# Patient Record
Sex: Male | Born: 1945 | Race: White | Hispanic: No | Marital: Married | Smoking: Never smoker
Health system: Southern US, Community
[De-identification: ages and names within clinical notes are randomized; demographics above are authoritative.]

## PROBLEM LIST (undated history)

## (undated) DIAGNOSIS — M5430 Sciatica, unspecified side: Secondary | ICD-10-CM

## (undated) DIAGNOSIS — N183 Chronic kidney disease, stage 3 unspecified: Secondary | ICD-10-CM

## (undated) DIAGNOSIS — E785 Hyperlipidemia, unspecified: Secondary | ICD-10-CM

## (undated) DIAGNOSIS — U071 COVID-19: Secondary | ICD-10-CM

## (undated) DIAGNOSIS — Z8719 Personal history of other diseases of the digestive system: Secondary | ICD-10-CM

## (undated) DIAGNOSIS — I639 Cerebral infarction, unspecified: Secondary | ICD-10-CM

## (undated) DIAGNOSIS — K219 Gastro-esophageal reflux disease without esophagitis: Secondary | ICD-10-CM

## (undated) DIAGNOSIS — I10 Essential (primary) hypertension: Secondary | ICD-10-CM

## (undated) DIAGNOSIS — Z87442 Personal history of urinary calculi: Secondary | ICD-10-CM

## (undated) DIAGNOSIS — K4090 Unilateral inguinal hernia, without obstruction or gangrene, not specified as recurrent: Secondary | ICD-10-CM

## (undated) DIAGNOSIS — G459 Transient cerebral ischemic attack, unspecified: Secondary | ICD-10-CM

## (undated) DIAGNOSIS — Z9289 Personal history of other medical treatment: Secondary | ICD-10-CM

## (undated) DIAGNOSIS — C439 Malignant melanoma of skin, unspecified: Secondary | ICD-10-CM

## (undated) DIAGNOSIS — M1990 Unspecified osteoarthritis, unspecified site: Secondary | ICD-10-CM

## (undated) HISTORY — DX: Hyperlipidemia, unspecified: E78.5

## (undated) HISTORY — DX: Malignant melanoma of skin, unspecified: C43.9

## (undated) HISTORY — DX: Sciatica, unspecified side: M54.30

## (undated) HISTORY — DX: Personal history of other medical treatment: Z92.89

## (undated) HISTORY — PX: NERVE REPAIR: SHX2083

## (undated) HISTORY — DX: Transient cerebral ischemic attack, unspecified: G45.9

## (undated) HISTORY — PX: BACK SURGERY: SHX140

## (undated) HISTORY — PX: LUMBAR DISC SURGERY: SHX700

## (undated) HISTORY — DX: Essential (primary) hypertension: I10

## (undated) HISTORY — DX: COVID-19: U07.1

## (undated) HISTORY — DX: Unilateral inguinal hernia, without obstruction or gangrene, not specified as recurrent: K40.90

---

## 1986-09-20 HISTORY — PX: OTHER SURGICAL HISTORY: SHX169

## 1995-12-20 HISTORY — PX: LAPAROSCOPIC INGUINAL HERNIA REPAIR: SUR788

## 1998-05-20 ENCOUNTER — Encounter: Admission: RE | Admit: 1998-05-20 | Discharge: 1998-05-20 | Payer: Self-pay | Admitting: Family Medicine

## 1998-07-01 ENCOUNTER — Encounter: Admission: RE | Admit: 1998-07-01 | Discharge: 1998-07-01 | Payer: Self-pay | Admitting: Family Medicine

## 1998-11-19 HISTORY — PX: SEPTOPLASTY: SUR1290

## 1998-11-24 ENCOUNTER — Ambulatory Visit (HOSPITAL_BASED_OUTPATIENT_CLINIC_OR_DEPARTMENT_OTHER): Admission: RE | Admit: 1998-11-24 | Discharge: 1998-11-24 | Payer: Self-pay | Admitting: Otolaryngology

## 1999-01-28 ENCOUNTER — Encounter: Admission: RE | Admit: 1999-01-28 | Discharge: 1999-01-28 | Payer: Self-pay | Admitting: Family Medicine

## 1999-10-30 ENCOUNTER — Encounter: Admission: RE | Admit: 1999-10-30 | Discharge: 1999-10-30 | Payer: Self-pay | Admitting: Family Medicine

## 2000-01-26 ENCOUNTER — Encounter: Admission: RE | Admit: 2000-01-26 | Discharge: 2000-01-26 | Payer: Self-pay | Admitting: Family Medicine

## 2000-01-27 ENCOUNTER — Encounter: Admission: RE | Admit: 2000-01-27 | Discharge: 2000-01-27 | Payer: Self-pay | Admitting: Family Medicine

## 2000-07-05 ENCOUNTER — Encounter: Admission: RE | Admit: 2000-07-05 | Discharge: 2000-07-05 | Payer: Self-pay | Admitting: Family Medicine

## 2000-07-20 ENCOUNTER — Encounter: Payer: Self-pay | Admitting: Family Medicine

## 2000-07-20 ENCOUNTER — Encounter: Admission: RE | Admit: 2000-07-20 | Discharge: 2000-07-20 | Payer: Self-pay | Admitting: Family Medicine

## 2000-07-21 HISTORY — PX: OTHER SURGICAL HISTORY: SHX169

## 2000-07-27 ENCOUNTER — Encounter: Admission: RE | Admit: 2000-07-27 | Discharge: 2000-07-27 | Payer: Self-pay | Admitting: Family Medicine

## 2001-01-10 ENCOUNTER — Encounter: Admission: RE | Admit: 2001-01-10 | Discharge: 2001-01-10 | Payer: Self-pay | Admitting: Family Medicine

## 2001-05-02 ENCOUNTER — Encounter: Admission: RE | Admit: 2001-05-02 | Discharge: 2001-05-02 | Payer: Self-pay | Admitting: Family Medicine

## 2001-05-04 ENCOUNTER — Encounter: Admission: RE | Admit: 2001-05-04 | Discharge: 2001-05-04 | Payer: Self-pay | Admitting: Family Medicine

## 2001-07-31 ENCOUNTER — Encounter: Admission: RE | Admit: 2001-07-31 | Discharge: 2001-07-31 | Payer: Self-pay | Admitting: Sports Medicine

## 2001-10-04 ENCOUNTER — Encounter: Admission: RE | Admit: 2001-10-04 | Discharge: 2001-10-04 | Payer: Self-pay | Admitting: Sports Medicine

## 2001-10-05 ENCOUNTER — Encounter: Admission: RE | Admit: 2001-10-05 | Discharge: 2001-10-05 | Payer: Self-pay | Admitting: Family Medicine

## 2001-11-01 ENCOUNTER — Encounter: Admission: RE | Admit: 2001-11-01 | Discharge: 2001-11-01 | Payer: Self-pay | Admitting: Family Medicine

## 2002-02-13 ENCOUNTER — Encounter: Admission: RE | Admit: 2002-02-13 | Discharge: 2002-02-13 | Payer: Self-pay | Admitting: Family Medicine

## 2002-10-03 ENCOUNTER — Encounter: Admission: RE | Admit: 2002-10-03 | Discharge: 2002-10-03 | Payer: Self-pay | Admitting: Family Medicine

## 2002-10-08 ENCOUNTER — Encounter: Admission: RE | Admit: 2002-10-08 | Discharge: 2002-10-08 | Payer: Self-pay | Admitting: Family Medicine

## 2002-10-31 ENCOUNTER — Encounter: Admission: RE | Admit: 2002-10-31 | Discharge: 2002-10-31 | Payer: Self-pay | Admitting: Family Medicine

## 2002-11-28 ENCOUNTER — Encounter: Admission: RE | Admit: 2002-11-28 | Discharge: 2002-11-28 | Payer: Self-pay | Admitting: Family Medicine

## 2003-01-16 ENCOUNTER — Encounter: Admission: RE | Admit: 2003-01-16 | Discharge: 2003-01-16 | Payer: Self-pay | Admitting: Family Medicine

## 2003-03-20 ENCOUNTER — Encounter: Admission: RE | Admit: 2003-03-20 | Discharge: 2003-03-20 | Payer: Self-pay | Admitting: Family Medicine

## 2003-10-29 ENCOUNTER — Ambulatory Visit (HOSPITAL_COMMUNITY): Admission: RE | Admit: 2003-10-29 | Discharge: 2003-10-29 | Payer: Self-pay | Admitting: Family Medicine

## 2003-10-29 ENCOUNTER — Encounter: Admission: RE | Admit: 2003-10-29 | Discharge: 2003-10-29 | Payer: Self-pay | Admitting: Family Medicine

## 2003-11-12 ENCOUNTER — Encounter: Admission: RE | Admit: 2003-11-12 | Discharge: 2003-11-12 | Payer: Self-pay | Admitting: Family Medicine

## 2003-11-19 DIAGNOSIS — Z9289 Personal history of other medical treatment: Secondary | ICD-10-CM

## 2003-11-19 HISTORY — DX: Personal history of other medical treatment: Z92.89

## 2003-11-21 ENCOUNTER — Ambulatory Visit (HOSPITAL_COMMUNITY): Admission: RE | Admit: 2003-11-21 | Discharge: 2003-11-21 | Payer: Self-pay | Admitting: Family Medicine

## 2003-11-21 HISTORY — PX: OTHER SURGICAL HISTORY: SHX169

## 2003-11-26 ENCOUNTER — Encounter: Admission: RE | Admit: 2003-11-26 | Discharge: 2003-11-26 | Payer: Self-pay | Admitting: Family Medicine

## 2004-01-13 ENCOUNTER — Encounter: Admission: RE | Admit: 2004-01-13 | Discharge: 2004-01-13 | Payer: Self-pay | Admitting: Family Medicine

## 2004-01-21 ENCOUNTER — Encounter: Admission: RE | Admit: 2004-01-21 | Discharge: 2004-01-21 | Payer: Self-pay | Admitting: Family Medicine

## 2004-12-08 ENCOUNTER — Ambulatory Visit: Payer: Self-pay | Admitting: Family Medicine

## 2004-12-08 HISTORY — PX: OTHER SURGICAL HISTORY: SHX169

## 2004-12-22 ENCOUNTER — Ambulatory Visit: Payer: Self-pay | Admitting: Family Medicine

## 2004-12-22 HISTORY — PX: OTHER SURGICAL HISTORY: SHX169

## 2004-12-30 ENCOUNTER — Ambulatory Visit: Payer: Self-pay | Admitting: Family Medicine

## 2005-01-20 ENCOUNTER — Ambulatory Visit: Payer: Self-pay | Admitting: Family Medicine

## 2005-01-20 HISTORY — PX: OTHER SURGICAL HISTORY: SHX169

## 2005-01-26 ENCOUNTER — Ambulatory Visit: Payer: Self-pay | Admitting: Family Medicine

## 2005-03-03 ENCOUNTER — Ambulatory Visit: Payer: Self-pay | Admitting: Family Medicine

## 2005-09-07 ENCOUNTER — Ambulatory Visit: Payer: Self-pay | Admitting: Sports Medicine

## 2005-12-15 ENCOUNTER — Ambulatory Visit: Payer: Self-pay | Admitting: Family Medicine

## 2005-12-19 HISTORY — PX: OTHER SURGICAL HISTORY: SHX169

## 2006-02-22 ENCOUNTER — Ambulatory Visit: Payer: Self-pay | Admitting: Family Medicine

## 2006-11-17 DIAGNOSIS — G4730 Sleep apnea, unspecified: Secondary | ICD-10-CM | POA: Insufficient documentation

## 2006-11-17 DIAGNOSIS — J45909 Unspecified asthma, uncomplicated: Secondary | ICD-10-CM | POA: Insufficient documentation

## 2006-11-17 DIAGNOSIS — I10 Essential (primary) hypertension: Secondary | ICD-10-CM | POA: Insufficient documentation

## 2006-11-17 DIAGNOSIS — H9190 Unspecified hearing loss, unspecified ear: Secondary | ICD-10-CM | POA: Insufficient documentation

## 2006-11-17 DIAGNOSIS — N200 Calculus of kidney: Secondary | ICD-10-CM | POA: Insufficient documentation

## 2006-11-17 DIAGNOSIS — E785 Hyperlipidemia, unspecified: Secondary | ICD-10-CM | POA: Insufficient documentation

## 2006-11-17 DIAGNOSIS — J309 Allergic rhinitis, unspecified: Secondary | ICD-10-CM | POA: Insufficient documentation

## 2006-11-17 DIAGNOSIS — M109 Gout, unspecified: Secondary | ICD-10-CM | POA: Insufficient documentation

## 2006-11-17 DIAGNOSIS — E781 Pure hyperglyceridemia: Secondary | ICD-10-CM | POA: Insufficient documentation

## 2006-11-17 DIAGNOSIS — L578 Other skin changes due to chronic exposure to nonionizing radiation: Secondary | ICD-10-CM | POA: Insufficient documentation

## 2009-09-20 DIAGNOSIS — I639 Cerebral infarction, unspecified: Secondary | ICD-10-CM

## 2009-09-20 HISTORY — DX: Cerebral infarction, unspecified: I63.9

## 2009-11-04 ENCOUNTER — Encounter: Admission: RE | Admit: 2009-11-04 | Discharge: 2009-11-04 | Payer: Self-pay | Admitting: Family Medicine

## 2009-12-19 ENCOUNTER — Encounter: Admission: RE | Admit: 2009-12-19 | Discharge: 2009-12-19 | Payer: Self-pay | Admitting: Family Medicine

## 2009-12-19 DIAGNOSIS — G459 Transient cerebral ischemic attack, unspecified: Secondary | ICD-10-CM

## 2009-12-19 HISTORY — DX: Transient cerebral ischemic attack, unspecified: G45.9

## 2009-12-31 ENCOUNTER — Observation Stay (HOSPITAL_COMMUNITY): Admission: EM | Admit: 2009-12-31 | Discharge: 2010-01-01 | Payer: Self-pay | Admitting: Emergency Medicine

## 2010-01-01 ENCOUNTER — Encounter (INDEPENDENT_AMBULATORY_CARE_PROVIDER_SITE_OTHER): Payer: Self-pay | Admitting: Emergency Medicine

## 2010-01-01 ENCOUNTER — Ambulatory Visit: Payer: Self-pay | Admitting: Surgery

## 2010-04-07 ENCOUNTER — Encounter: Admission: RE | Admit: 2010-04-07 | Discharge: 2010-04-07 | Payer: Self-pay | Admitting: Neurological Surgery

## 2010-04-20 ENCOUNTER — Encounter: Payer: Self-pay | Admitting: Cardiology

## 2010-04-23 ENCOUNTER — Encounter: Payer: Self-pay | Admitting: Cardiology

## 2010-05-20 ENCOUNTER — Ambulatory Visit: Payer: Self-pay | Admitting: Cardiology

## 2010-10-11 ENCOUNTER — Encounter: Payer: Self-pay | Admitting: Family Medicine

## 2010-10-20 NOTE — Letter (Signed)
Summary: [facility]   Imported By: Harlon Flor 06/02/2010 08:13:58  _____________________________________________________________________  External Attachment:    Type:   Image     Comment:   External Document

## 2010-10-20 NOTE — Assessment & Plan Note (Signed)
Summary: surgical clearence/mt  Medications Added METOPROLOL TARTRATE 25 MG TABS (METOPROLOL TARTRATE) one tablet two times a day CRESTOR 5 MG TABS (ROSUVASTATIN CALCIUM) one tablet once daily ASPIRIN 325 MG TABS (ASPIRIN) one tablet once daily FISH OIL 1000 MG CAPS (OMEGA-3 FATTY ACIDS) one tablet two times a day LISINOPRIL 10 MG TABS (LISINOPRIL) Take one tablet by mouth daily      Allergies Added: NKDA  Visit Type:  Initial Consult Primary Provider:  Dr. Holley Bouche  CC:  Need a surgical clearance for lower back surgery with Dr. Marikay Alar [facility]).  History of Present Illness: 65 Adams with history of HTN, hyperlipidema, and TIA in 4/11 presents for cardiology evaluation prior to lumbar spine surgery for sciatica.  Patient's recent history is significant for an episode of speech slurring and mild facial droop lasting for about 10 minutes in 4/11 thought to be consistent with TIA.  No evidence for CVA on MRI.  Echo showed normal LV function and no significant valvular abnormalities.  Connor Adams has had a herniated disc in his L-spine that has been causing severe sciatica-type symptoms.  Connor Adams is now scheduled for back surgery next week.  At baseline, Connor Adams has had excellent exercise tolerance.  Connor Adams is limited only by his back pain.  Connor Adams had been walking 2 miles 3-4 times a week until his back problems worsened.  Connor Adams can climb several flights of steps without problems.  No exertional chest pain or shortness of breath.  BP is high today, 159/90.   ECG: NSR at 52  Current Medications (verified): 1)  Metoprolol Tartrate 25 Mg Tabs (Metoprolol Tartrate) .... One Tablet Two Times A Day 2)  Crestor 5 Mg Tabs (Rosuvastatin Calcium) .... One Tablet Once Daily 3)  Aspirin 325 Mg Tabs (Aspirin) .... One Tablet Once Daily 4)  Fish Oil 1000 Mg Caps (Omega-3 Fatty Acids) .... One Tablet Two Times A Day  Allergies (verified): No Known Drug Allergies  Past History:  Past Surgical History: Last  updated: 11/17/2006 BMI 30.2 muscular - 12/08/2004, curretage actinic keratosis R ear - 01/20/2005, ETT low prob of CAD - 11/21/2003, excision melanoma in situ back - 01/20/2005, hemorrhoids sclerosed at colonoscopy - 12/19/2005, Hernia Repair L inguinal - 12/20/1995, Retinal laser surgery - 09/20/1986, septoplasty - 11/19/1998, uric acid 6.9 - 12/22/2004, x-ray L great toe - 07/21/2000  Family History: Last updated: 11/17/2006 2 sis A&W, Asthma son and grandson, CA pelvic M, CAD bypass - Bro age 28, CHF - F died age 49, DM - Bro diet Tx, MI - F age 30, M age 4  Social History: Last updated: 05/20/2010 Married to [place]; 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 TIA: 4/11, associated with slurred speech and mild facial droop. Lasted for about 10 minutes.  Had MRI, etc with [facility] that per his report was ok (Dr. Marjory Lies).  Echo (4/11): EF 55-60%, no regional WMAs, normal diastolic function, mild LAE, no source of embolus.  2. Hypertension 3. Melanoma: excised 4. ETT (3/05): low risk. 5. Hemorrhoids 6.  Left inguinal hernia 7.  Hyperlipidemia 8.  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  Family History: Reviewed history from 11/17/2006 and no changes required. 2 sis A&W, Asthma son and grandson, CA pelvic M, CAD bypass - Bro age 46, CHF - F died age 66, DM - Bro diet Tx, MI - F age 75, M age 69  Social History: Married to [place]; Teaches  Brewing technologist at [employer]; Non-drinker; Son,  Jena Gauss, married; Son, Casimiro Needle, married  Review of Systems       All systems reviewed and negative except as per HPI.   Vital Signs:  Patient profile:   65 year old male Height:      71 inches Weight:      201 pounds BMI:     28.14 Pulse rate:   53 / minute BP sitting:   159 / 90  (left arm) Cuff size:   regular  Vitals Entered By: Bishop Dublin, CMA (May 20, 2010 11:16 AM)  Physical Exam  General:  Well  developed, well nourished, in no acute distress. Head:  normocephalic and atraumatic Nose:  no deformity, discharge, inflammation, or lesions Mouth:  Teeth, gums and palate normal. Oral mucosa normal. Neck:  Neck supple, no JVD. No masses, thyromegaly or abnormal cervical nodes. Lungs:  Clear bilaterally to auscultation and percussion. Heart:  Non-displaced PMI, chest non-tender; regular rate and rhythm, S1, S2 without murmurs, rubs or gallops. Carotid upstroke normal, no bruit. Pedals normal pulses. No edema, no varicosities. Abdomen:  Bowel sounds positive; abdomen soft and non-tender without masses, organomegaly, or hernias noted. No hepatosplenomegaly. Extremities:  No clubbing or cyanosis. Neurologic:  Alert and oriented x 3. Skin:  Intact without lesions or rashes. Psych:  Normal affect.   Impression & Recommendations:  Problem # 1:  PREOPERATIVE EVALUATION Patient had excellent exercise tolerance prior to his back problems.  No chest pain or exertional dyspnea.  Connor Adams does have evidence for vascular disease (probable recent TIA). Echo showed preserved LV function.  Would continue current meds (ASA, statin, metoprolol).  No need for further cardiac evaluation prior to surgery.   Problem # 2:  HYPERTENSION, BENIGN SYSTEMIC (ICD-401.1) BP is running high today (159/90) and tends to be in the 140s systolic when Connor Adams checks it at home.  Given recent TIA, I would like better control.  Will start lisinopril 10 mg daily with BMET in 2 wks.   Other Orders: EKG w/ Interpretation (93000) T-Basic Metabolic Panel (32440-10272) Prescriptions: LISINOPRIL 10 MG TABS (LISINOPRIL) Take one tablet by mouth daily  #30 x 4   Entered by:   Benedict Needy, RN   Authorized by:   Marca Ancona, MD   Signed by:   Benedict Needy, RN on 05/20/2010   Method used:   Electronically to        [facility]* (retail)       [address]  [place], [place]  [address]       Ph: [phone] or [phone]       Fax: [phone]   RxID:   [id number]

## 2010-10-20 NOTE — Letter (Signed)
Summary: [facility]   Imported By: Harlon Flor 06/02/2010 08:14:45  _____________________________________________________________________  External Attachment:    Type:   Image     Comment:   External Document

## 2010-12-09 LAB — URINALYSIS, ROUTINE W REFLEX MICROSCOPIC
Bilirubin Urine: NEGATIVE
Glucose, UA: NEGATIVE mg/dL
Hgb urine dipstick: NEGATIVE
Ketones, ur: NEGATIVE mg/dL
Nitrite: NEGATIVE
Protein, ur: NEGATIVE mg/dL
Specific Gravity, Urine: 1.019 (ref 1.005–1.030)
Urobilinogen, UA: 0.2 mg/dL (ref 0.0–1.0)
pH: 6.5 (ref 5.0–8.0)

## 2010-12-09 LAB — DIFFERENTIAL
Basophils Absolute: 0 10*3/uL (ref 0.0–0.1)
Basophils Relative: 0 % (ref 0–1)
Eosinophils Absolute: 0.1 10*3/uL (ref 0.0–0.7)
Eosinophils Relative: 2 % (ref 0–5)
Lymphocytes Relative: 20 % (ref 12–46)
Lymphs Abs: 1.1 10*3/uL (ref 0.7–4.0)
Monocytes Absolute: 0.4 10*3/uL (ref 0.1–1.0)
Monocytes Relative: 7 % (ref 3–12)
Neutro Abs: 4 10*3/uL (ref 1.7–7.7)
Neutrophils Relative %: 71 % (ref 43–77)

## 2010-12-09 LAB — POCT CARDIAC MARKERS
CKMB, poc: <1 ng/mL — ABNORMAL LOW (ref 1.0–8.0)
Myoglobin, poc: 82.9 ng/mL (ref 12–200)
Troponin i, poc: <0.05 ng/mL (ref 0.00–0.09)

## 2010-12-09 LAB — HEMOGLOBIN A1C
Hgb A1c MFr Bld: 6.2 % — ABNORMAL HIGH (ref <5.7)
Hgb A1c MFr Bld: 6.2 % — ABNORMAL HIGH (ref <5.7)
Mean Plasma Glucose: 131 mg/dL — ABNORMAL HIGH (ref <117)
Mean Plasma Glucose: 131 mg/dL — ABNORMAL HIGH (ref <117)

## 2010-12-09 LAB — COMPREHENSIVE METABOLIC PANEL
ALT: 27 U/L (ref 0–53)
AST: 25 U/L (ref 0–37)
Albumin: 3.8 g/dL (ref 3.5–5.2)
Alkaline Phosphatase: 45 U/L (ref 39–117)
BUN: 20 mg/dL (ref 6–23)
CO2: 29 mEq/L (ref 19–32)
Calcium: 8.9 mg/dL (ref 8.4–10.5)
Chloride: 103 mEq/L (ref 96–112)
Creatinine, Ser: 1.22 mg/dL (ref 0.4–1.5)
GFR calc Af Amer: >60 mL/min (ref >60)
GFR calc non Af Amer: 60 mL/min — ABNORMAL LOW (ref >60)
Glucose, Bld: 119 mg/dL — ABNORMAL HIGH (ref 70–99)
Potassium: 4 mEq/L (ref 3.5–5.1)
Sodium: 140 mEq/L (ref 135–145)
Total Bilirubin: 0.6 mg/dL (ref 0.3–1.2)
Total Protein: 6.6 g/dL (ref 6.0–8.3)

## 2010-12-09 LAB — TROPONIN I: Troponin I: <0.01 ng/mL (ref 0.00–0.06)

## 2010-12-09 LAB — CBC
HCT: 39.1 % (ref 39.0–52.0)
Hemoglobin: 13.8 g/dL (ref 13.0–17.0)
MCHC: 35.4 g/dL (ref 30.0–36.0)
MCV: 97.9 fL (ref 78.0–100.0)
Platelets: 149 10*3/uL — ABNORMAL LOW (ref 150–400)
RBC: 3.99 MIL/uL — ABNORMAL LOW (ref 4.22–5.81)
RDW: 13.3 % (ref 11.5–15.5)
WBC: 5.6 10*3/uL (ref 4.0–10.5)

## 2010-12-09 LAB — LIPID PANEL
Cholesterol: 160 mg/dL (ref 0–200)
HDL: 28 mg/dL — ABNORMAL LOW (ref >39)
LDL Cholesterol: 89 mg/dL (ref 0–99)
Total CHOL/HDL Ratio: 5.7 RATIO
Triglycerides: 213 mg/dL — ABNORMAL HIGH (ref <150)
VLDL: 43 mg/dL — ABNORMAL HIGH (ref 0–40)

## 2010-12-09 LAB — GLUCOSE, CAPILLARY: Glucose-Capillary: 124 mg/dL — ABNORMAL HIGH (ref 70–99)

## 2010-12-09 LAB — CK TOTAL AND CKMB (NOT AT ARMC)
CK, MB: 1.7 ng/mL (ref 0.3–4.0)
Relative Index: INVALID (ref 0.0–2.5)
Total CK: 99 U/L (ref 7–232)

## 2010-12-09 LAB — PROTIME-INR
INR: 1.05 (ref 0.00–1.49)
Prothrombin Time: 13.6 seconds (ref 11.6–15.2)

## 2010-12-09 LAB — APTT: aPTT: 24 seconds (ref 24–37)

## 2011-04-05 ENCOUNTER — Encounter: Payer: Self-pay | Admitting: Cardiology

## 2011-08-22 ENCOUNTER — Emergency Department (HOSPITAL_COMMUNITY): Payer: BC Managed Care – PPO

## 2011-08-22 ENCOUNTER — Emergency Department (HOSPITAL_COMMUNITY)
Admission: EM | Admit: 2011-08-22 | Discharge: 2011-08-22 | Disposition: A | Discharge status: Home / Self Care | Payer: BC Managed Care – PPO | Attending: Emergency Medicine | Admitting: Emergency Medicine

## 2011-08-22 ENCOUNTER — Encounter (HOSPITAL_COMMUNITY): Payer: Self-pay | Admitting: *Deleted

## 2011-08-22 DIAGNOSIS — E785 Hyperlipidemia, unspecified: Secondary | ICD-10-CM | POA: Insufficient documentation

## 2011-08-22 DIAGNOSIS — I498 Other specified cardiac arrhythmias: Secondary | ICD-10-CM | POA: Insufficient documentation

## 2011-08-22 DIAGNOSIS — Z79899 Other long term (current) drug therapy: Secondary | ICD-10-CM | POA: Insufficient documentation

## 2011-08-22 DIAGNOSIS — I10 Essential (primary) hypertension: Secondary | ICD-10-CM | POA: Insufficient documentation

## 2011-08-22 DIAGNOSIS — Z7982 Long term (current) use of aspirin: Secondary | ICD-10-CM | POA: Insufficient documentation

## 2011-08-22 DIAGNOSIS — R079 Chest pain, unspecified: Secondary | ICD-10-CM | POA: Insufficient documentation

## 2011-08-22 DIAGNOSIS — K219 Gastro-esophageal reflux disease without esophagitis: Secondary | ICD-10-CM

## 2011-08-22 DIAGNOSIS — R002 Palpitations: Secondary | ICD-10-CM | POA: Insufficient documentation

## 2011-08-22 DIAGNOSIS — Z8673 Personal history of transient ischemic attack (TIA), and cerebral infarction without residual deficits: Secondary | ICD-10-CM | POA: Insufficient documentation

## 2011-08-22 LAB — CBC
HCT: 42.3 % (ref 39.0–52.0)
Hemoglobin: 14.4 g/dL (ref 13.0–17.0)
MCH: 33 pg (ref 26.0–34.0)
MCHC: 34 g/dL (ref 30.0–36.0)
MCV: 96.8 fL (ref 78.0–100.0)
Platelets: 137 10*3/uL — ABNORMAL LOW (ref 150–400)
RBC: 4.37 MIL/uL (ref 4.22–5.81)
RDW: 12.7 % (ref 11.5–15.5)
WBC: 4.9 10*3/uL (ref 4.0–10.5)

## 2011-08-22 LAB — POCT I-STAT, CHEM 8
BUN: 21 mg/dL (ref 6–23)
Calcium, Ion: 1.14 mmol/L (ref 1.12–1.32)
Chloride: 105 mEq/L (ref 96–112)
Creatinine, Ser: 1.2 mg/dL (ref 0.50–1.35)
Glucose, Bld: 96 mg/dL (ref 70–99)
HCT: 44 % (ref 39.0–52.0)
Hemoglobin: 15 g/dL (ref 13.0–17.0)
Potassium: 4.1 mEq/L (ref 3.5–5.1)
Sodium: 144 mEq/L (ref 135–145)
TCO2: 27 mmol/L (ref 0–100)

## 2011-08-22 LAB — DIFFERENTIAL
Basophils Absolute: 0.1 10*3/uL (ref 0.0–0.1)
Basophils Relative: 1 % (ref 0–1)
Eosinophils Absolute: 0.1 10*3/uL (ref 0.0–0.7)
Eosinophils Relative: 3 % (ref 0–5)
Lymphocytes Relative: 24 % (ref 12–46)
Lymphs Abs: 1.2 10*3/uL (ref 0.7–4.0)
Monocytes Absolute: 0.3 10*3/uL (ref 0.1–1.0)
Monocytes Relative: 6 % (ref 3–12)
Neutro Abs: 3.2 10*3/uL (ref 1.7–7.7)
Neutrophils Relative %: 66 % (ref 43–77)

## 2011-08-22 LAB — CARDIAC PANEL(CRET KIN+CKTOT+MB+TROPI)
CK, MB: 3.1 ng/mL (ref 0.3–4.0)
Relative Index: 2.4 (ref 0.0–2.5)
Total CK: 127 U/L (ref 7–232)
Troponin I: <0.3 ng/mL (ref <0.30)

## 2011-08-22 LAB — TROPONIN I: Troponin I: <0.3 ng/mL (ref <0.30)

## 2011-08-22 NOTE — ED Notes (Signed)
Pt states that 2 years ago he had a stroke and was placed on a full 325mg  aspirin a day and lopressor. Pt HR normally in the 40's. Pt states that he is trying to come off the lopressor and the past few days has been having PVC's (pt on heart monitor from PCP.) pt states that this morning he felt a flutter and then he noticed he was having a burning in his chest center chest. Pt states that the pain is gone now but then the pain was there he felt it center back as well. Pt denies N/V, SOB, but wife states pt was pale when he woke her up and was slightly diaphoretic. Pt states that he feels better now. Pt alert and oriented and able to follow commands and move all extremities.

## 2011-08-22 NOTE — Consults (Signed)
History and Physical   Patient ID: Connor Adams MRN: [id number], DOB/AGE: 1946/05/16   Admit date: 08/22/2011 Date of Consult: 08/22/2011   Primary Physician: No primary provider on file. Primary Cardiologist: None  Pt. Profile: Connor Adams is a pleasant Caucasian male with PMHx significant for HTN, HL, premature family hx of CAD, normal cardiac work-up (normal exercise stress test 10-12 years ago and ECHO 04/11 for TIA revealing LVEF 55-60% without wall motion abnormalities and normal diastolic function, mild LAE) and hx of TIA presenting to [facility] ED with chest pain.   Problem List: Past Medical History  Diagnosis Date  . TIA (transient ischemic attack) 4/11    associated w slurred speecha ndmild facial droop. lasted for about 10 minutes. Had MRI, etc with [facility] that per his report was ok (dr. Merceda Elks). Echo (4/11): EF 55-60%, no regional WMAs, normal diastolic function, mild LAE, no source of embolus  . HTN (hypertension)   . Melanoma     excied  . History of ETT 3/05    low risk  . Hemorrhoids   . Left inguinal hernia   . HLD (hyperlipidemia)   . Sciatica     from lumbar disc disease    Past Surgical History  Procedure Date  . Bmi 30.2 muscular 12/08/04  . Curretage actinic keratosis r ear 01/20/05  . Ett low prob of cad 11/21/03  . Excision melanoma in situ back 01/20/05  . Hemorrhoids sclerosed at colonoscopy 12/19/05  . Laparoscopic inguinal hernia repair 12/20/95  . Retinal laser surgery 09/20/86  . Septoplasty 11/19/98  . Uric acid 6.9 12/22/04  . X-ray l great toe 07/21/00     Allergies: No Known Allergies  HPI:  Pt reports experiencing an episode of chest pain at 2:30AM. He wears an event monitor per PCP and woke up to it beeping. The chest pain was substernal without radiation described as burning and rated at a 2/10. He endorses associated palpitations during this time. Pain was resolved by drinking cold water. He reports occasional episodes such as this in  the past which have resolved with drinking cold water. One such episode occurred yesterday with associated back pain which resolved with ibuprofen.   He is active at 30 min 3x/day of aerobic exercise. No tobacco history. He denies n/v, chills, fever, dizziness, diaphoresis, SOB, DOE, edema, orthopnea, PND, cough, extended travel, sick contacts or history of GERD.   Of note, he has been on Lopressor for HTN for "years" with a baseline HR in 40s. His event monitor was ordered per PCP through [facility] cardiology for occasionally "feeling heart skip a beat" and documented PVCs.   Patient was pain free on presentation to ED. EKG was sinus bradycardia at 45 bpm, no acute ST-T wave changes. CXR unremarkable. POC TnI neg. He has taken his outpatient ASA 325mg  today.   Inpatient Medications:  None currently   Outpatient Medications: Aspirin 325mg  daily Lopressor 25mg  BID Crestor 5mg  daily Ibuprofen 200mg  BID PRN for pain Multivitamins Fish oil   Family History  Problem Relation Age of Onset  . Heart attack Mother 37  . Heart attack Father 76  . Heart failure Brother 44    CABGx4  . Asthma Son   . Diabetes Brother      History   Social History  . Marital Status: Married    Spouse Name: N/A    Number of Children: N/A  . Years of Education: N/A   Occupational History  . Not on  file.   Social History Main Topics  . Smoking status: Never Smoker   . Smokeless tobacco: Not on file  . Alcohol Use: No  . Drug Use: Not on file  . Sexually Active:    Other Topics Concern  . Not on file   Social History Narrative   Married to [place]; Art gallery manager at [employer] Son, Jena Gauss married; Son, Casimiro Needle, Married.      Review of Systems: General: negative for chills, fever, night sweats Cardiovascular: positive for chest pain, palpitations; negative for dyspnea on exertion, edema, orthopnea, paroxysmal nocturnal dyspnea or shortness of breath Dermatological: negative for  rash Respiratory: negative for cough or wheezing Urologic: negative for hematuria Abdominal: negative for nausea, vomiting, diarrhea, bright red blood per rectum, melena, or hematemesis Neurologic: negative for visual changes, syncope, or dizziness All other systems reviewed and are otherwise negative except as noted above.  Physical Exam: Blood pressure 139/78, pulse 44, temperature 97.8 F (36.6 C), temperature source Oral, resp. rate 15, SpO2 97.00%.    General: Well developed, well nourished, NAD Head: Normocephalic, atraumatic, sclera non-icteric, Neck: Negative for carotid bruits. JVD not elevated. Lungs: Clear bilaterally to auscultation without wheezes, rales, or rhonchi. Breathing is unlabored. Heart: bradycardia, regular rhythm, with clear S1 S2. No murmurs, rubs, or gallops appreciated. Abdomen: Soft, non-tender, non-distended with normoactive bowel sounds. No hepatomegaly. No rebound/guarding. No obvious abdominal masses. Msk:  Strength and tone appears normal for age. Extremities: No clubbing, cyanosis or edema.  Distal pedal pulses are 2+ and equal bilaterally. Neuro: Alert and oriented X 3. Moves all extremities spontaneously. Psych:  Responds to questions appropriately with a normal affect.  Labs: Recent Labs  Basename 08/22/11 0625 08/22/11 0610   WBC -- 4.9   HGB 15.0 14.4   HCT 44.0 42.3   MCV -- 96.8   PLT -- 137*   No results found for this basename: VITAMINB12,FOLATE,FERRITIN,TIBC,IRON,RETICCTPCT in the last 72 hours No results found for this basename: DDIMER:2 in the last 72 hours  Lab 08/22/11 0625  NA 144  K 4.1  CL 105  CO2 --  BUN 21  CREATININE 1.20  CALCIUM --  PROT --  BILITOT --  ALKPHOS --  ALT --  AST --  AMYLASE --  LIPASE --  GLUCOSE 96   No results found for this basename: HGBA1C in the last 72 hours Recent Labs  Basename 08/22/11 0612   CKTOTAL --   CKMB --   CKMBINDEX --   TROPONINI <0.30   No results found for this  basename: POCBNP in the last 72 hours No results found for this basename: CHOL,HDL,LDLCALC,TRIG,CHOLHDL,LDLDIRECT in the last 72 hours No results found for this basename: TSH,T4TOTAL,FREET3,T3FREE,THYROIDAB in the last 72 hours  Radiology/Studies: Dg Chest Portable 1 View  08/22/2011  *RADIOLOGY REPORT*  Clinical Data: Chest pain and burning.  PORTABLE CHEST - 1 VIEW  Comparison: None.  Findings: The lungs are well-aerated and clear.  There is no evidence of focal opacification, pleural effusion or pneumothorax.  The cardiomediastinal silhouette is within normal limits.  No acute osseous abnormalities are seen.  IMPRESSION: No acute cardiopulmonary process seen.  Original Report Authenticated By: Tonia Ghent, M.D.    EKG: sinus bradycardia at 45 bpm, no acute ST-T wave changes  ASSESSMENT AND PLAN:   1. Chest pain- story supports symptoms of reflux, patient does have significant cardiac risk factors, but no evidence of ischemia today or in the past. CXR and EKG unremarkable in the ED. POC TnI  neg. Low suspicion for cardiac process. Will get one set of cardiac enzymes, if negative may be discharged home with PPI.    Signed, Hurman Horn, PA-C 08/22/2011, 11:33 AM   Attending Note:   The patient was seen and examined.  Agree with assessment and plan as noted above.  Pt has hx of HTN.  Otherwise he is very stable.  He works out on a regular basis without any chest pain. The mild burning resolved with a sip of water.    Exam is normal.  Plan.  Recheck cardiac enzymes.  If his CK and troponin are negative  Now ( 10 hours after episode) will DC to home.  I'll see in the office in several weeks - sooner if needed.  If the enzymes are abnormal, will need to admit for further eval.  Also has hx of PVCs - benign.  HTN is well controlled.  Bradycardia is stable.   Vesta Mixer, Montez Hageman., MD, [facility] 08/22/2011, 11:52 AM

## 2011-08-22 NOTE — ED Provider Notes (Signed)
History     CSN: [id number] Arrival date & time: 08/22/2011  4:06 AM   None     Chief Complaint  Patient presents with  . Chest Pain    (Consider location/radiation/quality/duration/timing/severity/associated sxs/prior treatment) HPI Complains of anterior pain awakened him sleep at 2:30 AM today burning in nature nonradiating lasted one hour. No associated shortness of breath sweatiness or nausea. Also reports episode of chest pain yesterday radiating to the back lasted one hour resolved after he treated himself with ibuprofen. Patient pain was moderate in severity. No treatment prior to arrival today. Presently asymptomatic. No other associated symptoms Past Medical History  Diagnosis Date  . TIA (transient ischemic attack) 4/11    associated w slurred speecha ndmild facial droop. lasted for about 10 minutes. Had MRI, etc with [facility] that per his report was ok (dr. Merceda Elks). Echo (4/11): EF 55-60%, no regional WMAs, normal diastolic function, mild LAE, no source of embolus  . HTN (hypertension)   . Melanoma     excied  . History of ETT 3/05    low risk  . Hemorrhoids   . Left inguinal hernia   . HLD (hyperlipidemia)   . Sciatica     from lumbar disc disease    Past Surgical History  Procedure Date  . Bmi 30.2 muscular 12/08/04  . Curretage actinic keratosis r ear 01/20/05  . Ett low prob of cad 11/21/03  . Excision melanoma in situ back 01/20/05  . Hemorrhoids sclerosed at colonoscopy 12/19/05  . Laparoscopic inguinal hernia repair 12/20/95  . Retinal laser surgery 09/20/86  . Septoplasty 11/19/98  . Uric acid 6.9 12/22/04  . X-ray l great toe 07/21/00    Family History  Problem Relation Age of Onset  . Heart attack Mother 11  . Heart attack Father 45  . Heart failure Brother   . Asthma Son   . Diabetes Brother     History  Substance Use Topics  . Smoking status: Never Smoker   . Smokeless tobacco: Not on file  . Alcohol Use: No      Review of Systems    Constitutional: Negative.   HENT: Negative.   Respiratory: Negative.   Cardiovascular: Positive for chest pain and palpitations.       Palpitations for approximately the past one month, patient is currently wearing an event monitor  Gastrointestinal: Negative.   Musculoskeletal: Negative.   Skin: Negative.   Neurological: Negative.   Hematological: Negative.   Psychiatric/Behavioral: Negative.   All other systems reviewed and are negative.    Allergies  Review of patient's allergies indicates no known allergies.  Home Medications   Current Outpatient Rx  Name Route Sig Dispense Refill  . ASPIRIN 325 MG PO TABS Oral Take 325 mg by mouth daily.      Marland Kitchen METOPROLOL TARTRATE 25 MG PO TABS Oral Take 25 mg by mouth 2 (two) times daily.      Marland Kitchen FISH OIL 1000 MG PO CAPS Oral Take by mouth daily.      Marland Kitchen ROSUVASTATIN CALCIUM 5 MG PO TABS Oral Take 5 mg by mouth daily.        There were no vitals taken for this visit.  Physical Exam  Nursing note and vitals reviewed. Constitutional: He appears well-developed and well-nourished.  HENT:  Head: Normocephalic and atraumatic.  Eyes: Conjunctivae are normal. Pupils are equal, round, and reactive to light.  Neck: Neck supple. No tracheal deviation present. No thyromegaly present.  Cardiovascular: Regular  rhythm and intact distal pulses.   No murmur heard.      Bradycardic  Pulmonary/Chest: Effort normal and breath sounds normal.  Abdominal: Soft. Bowel sounds are normal. He exhibits no distension. There is no tenderness.  Musculoskeletal: Normal range of motion. He exhibits no edema and no tenderness.  Neurological: He is alert. Coordination normal.  Skin: Skin is warm and dry. No rash noted.  Psychiatric: He has a normal mood and affect.    ED Course  Procedures (including critical care time) 7:30 AM patient remains asymtomaticLabs Reviewed - No data to display No results found. Results for orders placed during the hospital  encounter of 08/22/11  CBC      Component Value Range   WBC 4.9  4.0 - 10.5 (K/uL)   RBC 4.37  4.22 - 5.81 (MIL/uL)   Hemoglobin 14.4  13.0 - 17.0 (g/dL)   HCT 16.1  09.6 - 04.5 (%)   MCV 96.8  78.0 - 100.0 (fL)   MCH 33.0  26.0 - 34.0 (pg)   MCHC 34.0  30.0 - 36.0 (g/dL)   RDW 40.9  81.1 - 91.4 (%)   Platelets 137 (*) 150 - 400 (K/uL)  DIFFERENTIAL      Component Value Range   Neutrophils Relative 66  43 - 77 (%)   Neutro Abs 3.2  1.7 - 7.7 (K/uL)   Lymphocytes Relative 24  12 - 46 (%)   Lymphs Abs 1.2  0.7 - 4.0 (K/uL)   Monocytes Relative 6  3 - 12 (%)   Monocytes Absolute 0.3  0.1 - 1.0 (K/uL)   Eosinophils Relative 3  0 - 5 (%)   Eosinophils Absolute 0.1  0.0 - 0.7 (K/uL)   Basophils Relative 1  0 - 1 (%)   Basophils Absolute 0.1  0.0 - 0.1 (K/uL)  TROPONIN I      Component Value Range   Troponin I <0.30  <0.30 (ng/mL)  POCT I-STAT, CHEM 8      Component Value Range   Sodium 144  135 - 145 (mEq/L)   Potassium 4.1  3.5 - 5.1 (mEq/L)   Chloride 105  96 - 112 (mEq/L)   BUN 21  6 - 23 (mg/dL)   Creatinine, Ser 7.82  0.50 - 1.35 (mg/dL)   Glucose, Bld 96  70 - 99 (mg/dL)   Calcium, Ion 9.56  2.13 - 1.32 (mmol/L)   TCO2 27  0 - 100 (mmol/L)   Hemoglobin 15.0  13.0 - 17.0 (g/dL)   HCT 08.6  57.8 - 46.9 (%)   Dg Chest Portable 1 View  08/22/2011  *RADIOLOGY REPORT*  Clinical Data: Chest pain and burning.  PORTABLE CHEST - 1 VIEW  Comparison: None.  Findings: The lungs are well-aerated and clear.  There is no evidence of focal opacification, pleural effusion or pneumothorax.  The cardiomediastinal silhouette is within normal limits.  No acute osseous abnormalities are seen.  IMPRESSION: No acute cardiopulmonary process seen.  Original Report Authenticated By: Tonia Ghent, M.D.     No diagnosis found.   Date: 08/22/2011  Rate: 45  Rhythm: sinus bradycardia  QRS Axis: normal  Intervals: normal  ST/T Wave abnormalities: normal  Conduction Disutrbances:none  Narrative  Interpretation:   Old EKG Reviewed: none available   MDM  Aspirin not given in the emergency department as patient took 325 mg of aspirin as his regularly scheduled medicine at 9 PM last night Given age , risk factors, sx c/w  angina 730  am spoke with Dr Donnie Aho.  [facility] to evaluatte pt in ed for possible admission Diagnosis:new onset angina     Doug Sou, MD 08/22/11 [phone]

## 2011-08-22 NOTE — ED Notes (Signed)
Chest burning woke Pt up at0 230  This AM.denies any radiating pain.

## 2011-08-22 NOTE — ED Notes (Signed)
Cardiology at bedside.

## 2012-02-17 ENCOUNTER — Encounter (INDEPENDENT_AMBULATORY_CARE_PROVIDER_SITE_OTHER): Payer: BC Managed Care – PPO

## 2012-02-17 ENCOUNTER — Other Ambulatory Visit: Payer: Self-pay | Admitting: Cardiology

## 2012-02-17 DIAGNOSIS — M79609 Pain in unspecified limb: Secondary | ICD-10-CM

## 2012-02-17 DIAGNOSIS — R609 Edema, unspecified: Secondary | ICD-10-CM

## 2012-02-17 DIAGNOSIS — M7989 Other specified soft tissue disorders: Secondary | ICD-10-CM

## 2012-04-27 ENCOUNTER — Other Ambulatory Visit: Payer: Self-pay | Admitting: Family Medicine

## 2012-04-27 DIAGNOSIS — R200 Anesthesia of skin: Secondary | ICD-10-CM

## 2012-04-28 ENCOUNTER — Ambulatory Visit
Admission: RE | Admit: 2012-04-28 | Discharge: 2012-04-28 | Disposition: A | Discharge status: Home / Self Care | Payer: BC Managed Care – PPO | Source: Ambulatory Visit | Attending: Family Medicine | Admitting: Family Medicine

## 2012-04-28 DIAGNOSIS — R200 Anesthesia of skin: Secondary | ICD-10-CM

## 2014-01-05 ENCOUNTER — Emergency Department (HOSPITAL_COMMUNITY)
Admission: EM | Admit: 2014-01-05 | Discharge: 2014-01-05 | Disposition: A | Discharge status: Home / Self Care | Payer: PRIVATE HEALTH INSURANCE | Attending: Emergency Medicine | Admitting: Emergency Medicine

## 2014-01-05 ENCOUNTER — Emergency Department (HOSPITAL_COMMUNITY): Payer: PRIVATE HEALTH INSURANCE

## 2014-01-05 ENCOUNTER — Encounter (HOSPITAL_COMMUNITY): Payer: Self-pay | Admitting: Emergency Medicine

## 2014-01-05 DIAGNOSIS — R42 Dizziness and giddiness: Secondary | ICD-10-CM | POA: Insufficient documentation

## 2014-01-05 DIAGNOSIS — Z8719 Personal history of other diseases of the digestive system: Secondary | ICD-10-CM | POA: Insufficient documentation

## 2014-01-05 DIAGNOSIS — I10 Essential (primary) hypertension: Secondary | ICD-10-CM | POA: Insufficient documentation

## 2014-01-05 DIAGNOSIS — Z8739 Personal history of other diseases of the musculoskeletal system and connective tissue: Secondary | ICD-10-CM | POA: Insufficient documentation

## 2014-01-05 DIAGNOSIS — R110 Nausea: Secondary | ICD-10-CM | POA: Insufficient documentation

## 2014-01-05 DIAGNOSIS — Z9189 Other specified personal risk factors, not elsewhere classified: Secondary | ICD-10-CM | POA: Insufficient documentation

## 2014-01-05 DIAGNOSIS — Z7982 Long term (current) use of aspirin: Secondary | ICD-10-CM | POA: Insufficient documentation

## 2014-01-05 DIAGNOSIS — Z8673 Personal history of transient ischemic attack (TIA), and cerebral infarction without residual deficits: Secondary | ICD-10-CM | POA: Insufficient documentation

## 2014-01-05 DIAGNOSIS — E785 Hyperlipidemia, unspecified: Secondary | ICD-10-CM | POA: Insufficient documentation

## 2014-01-05 DIAGNOSIS — Z79899 Other long term (current) drug therapy: Secondary | ICD-10-CM | POA: Insufficient documentation

## 2014-01-05 DIAGNOSIS — Z85820 Personal history of malignant melanoma of skin: Secondary | ICD-10-CM | POA: Insufficient documentation

## 2014-01-05 LAB — CBC WITH DIFFERENTIAL/PLATELET
Basophils Absolute: 0 10*3/uL (ref 0.0–0.1)
Basophils Relative: 1 % (ref 0–1)
Eosinophils Absolute: 0.1 10*3/uL (ref 0.0–0.7)
Eosinophils Relative: 1 % (ref 0–5)
HCT: 44.4 % (ref 39.0–52.0)
Hemoglobin: 14.8 g/dL (ref 13.0–17.0)
Lymphocytes Relative: 14 % (ref 12–46)
Lymphs Abs: 0.8 10*3/uL (ref 0.7–4.0)
MCH: 32.5 pg (ref 26.0–34.0)
MCHC: 33.3 g/dL (ref 30.0–36.0)
MCV: 97.6 fL (ref 78.0–100.0)
Monocytes Absolute: 0.3 10*3/uL (ref 0.1–1.0)
Monocytes Relative: 5 % (ref 3–12)
Neutro Abs: 4.7 10*3/uL (ref 1.7–7.7)
Neutrophils Relative %: 79 % — ABNORMAL HIGH (ref 43–77)
Platelets: 151 10*3/uL (ref 150–400)
RBC: 4.55 MIL/uL (ref 4.22–5.81)
RDW: 13.1 % (ref 11.5–15.5)
WBC: 5.9 10*3/uL (ref 4.0–10.5)

## 2014-01-05 LAB — COMPREHENSIVE METABOLIC PANEL
ALT: 23 U/L (ref 0–53)
AST: 24 U/L (ref 0–37)
Albumin: 3.9 g/dL (ref 3.5–5.2)
Alkaline Phosphatase: 49 U/L (ref 39–117)
BUN: 21 mg/dL (ref 6–23)
CO2: 26 mEq/L (ref 19–32)
Calcium: 9.3 mg/dL (ref 8.4–10.5)
Chloride: 104 mEq/L (ref 96–112)
Creatinine, Ser: 1.21 mg/dL (ref 0.50–1.35)
GFR calc Af Amer: 70 mL/min — ABNORMAL LOW (ref >90)
GFR calc non Af Amer: 60 mL/min — ABNORMAL LOW (ref >90)
Glucose, Bld: 107 mg/dL — ABNORMAL HIGH (ref 70–99)
Potassium: 4.4 mEq/L (ref 3.7–5.3)
Sodium: 144 mEq/L (ref 137–147)
Total Bilirubin: 0.5 mg/dL (ref 0.3–1.2)
Total Protein: 7 g/dL (ref 6.0–8.3)

## 2014-01-05 LAB — URINALYSIS, ROUTINE W REFLEX MICROSCOPIC
Bilirubin Urine: NEGATIVE
Glucose, UA: NEGATIVE mg/dL
Hgb urine dipstick: NEGATIVE
Ketones, ur: NEGATIVE mg/dL
Leukocytes, UA: NEGATIVE
Nitrite: NEGATIVE
Protein, ur: NEGATIVE mg/dL
Specific Gravity, Urine: 1.019 (ref 1.005–1.030)
Urobilinogen, UA: 0.2 mg/dL (ref 0.0–1.0)
pH: 7 (ref 5.0–8.0)

## 2014-01-05 LAB — TROPONIN I: Troponin I: <0.3 ng/mL (ref <0.30)

## 2014-01-05 MED ORDER — SODIUM CHLORIDE 0.9 % IV BOLUS (SEPSIS)
1000.0000 mL | INTRAVENOUS | Status: AC
  Administered 2014-01-05: 1000 mL via INTRAVENOUS

## 2014-01-05 NOTE — ED Provider Notes (Addendum)
CSN: [id number]     Arrival date & time 01/05/14  0910 History   First MD Initiated Contact with Patient 01/05/14 [phone]     Chief Complaint  Patient presents with  . Dizziness     (Consider location/radiation/quality/duration/timing/severity/associated sxs/prior Treatment) Patient is a 68 y.o. male presenting with dizziness. The history is provided by the patient.  Dizziness Quality:  Room spinning and lightheadedness Severity:  Mild Onset quality:  Sudden Duration: minutes. Timing: once. Progression:  Resolved Chronicity:  New Context comment:  Sitting up Relieved by: spontaneously. Worsened by:  Nothing tried Ineffective treatments:  None tried Associated symptoms: nausea   Associated symptoms: no chest pain, no diarrhea, no headaches, no shortness of breath and no vomiting     Past Medical History  Diagnosis Date  . TIA (transient ischemic attack) 4/11    associated w slurred speecha ndmild facial droop. lasted for about 10 minutes. Had MRI, etc with [facility] that per his report was ok (dr. Stephannie Li). Echo (4/11): EF 55-60%, no regional WMAs, normal diastolic function, mild LAE, no source of embolus  . HTN (hypertension)   . Melanoma     excied  . History of ETT 3/05    low risk  . Hemorrhoids   . Left inguinal hernia   . HLD (hyperlipidemia)   . Sciatica     from lumbar disc disease   Past Surgical History  Procedure Laterality Date  . Bmi 30.2 muscular  12/08/04  . Curretage actinic keratosis r ear  01/20/05  . Ett low prob of cad  11/21/03  . Excision melanoma in situ back  01/20/05  . Hemorrhoids sclerosed at colonoscopy  12/19/05  . Laparoscopic inguinal hernia repair  12/20/95  . Retinal laser surgery  09/20/86  . Septoplasty  11/19/98  . Uric acid 6.9  12/22/04  . X-ray l great toe  07/21/00   Family History  Problem Relation Age of Onset  . Heart attack Mother 39  . Heart attack Father 39  . Heart failure Brother 44    CABGx4  . Asthma Son   .  Diabetes Brother    History  Substance Use Topics  . Smoking status: Never Smoker   . Smokeless tobacco: Not on file  . Alcohol Use: No    Review of Systems  Constitutional: Negative for fever.  HENT: Negative for drooling and rhinorrhea.   Eyes: Negative for pain.  Respiratory: Negative for cough and shortness of breath.   Cardiovascular: Negative for chest pain and leg swelling.  Gastrointestinal: Positive for nausea. Negative for vomiting, abdominal pain and diarrhea.  Genitourinary: Negative for dysuria and hematuria.  Musculoskeletal: Negative for gait problem and neck pain.  Skin: Negative for color change.  Neurological: Positive for dizziness. Negative for numbness and headaches.  Hematological: Negative for adenopathy.  Psychiatric/Behavioral: Negative for behavioral problems.  All other systems reviewed and are negative.     Allergies  Review of patient's allergies indicates no known allergies.  Home Medications   Prior to Admission medications   Medication Sig Start Date End Date Taking? Authorizing Provider  aspirin 325 MG tablet Take 325 mg by mouth daily.    Yes Historical Provider, MD  ibuprofen (ADVIL,MOTRIN) 200 MG tablet Take 400 mg by mouth daily as needed. For  pain   Yes Historical Provider, MD  metoprolol tartrate (LOPRESSOR) 25 MG tablet Take 25 mg by mouth daily.    Yes Historical Provider, MD  Multiple Vitamins-Minerals (MULTIVITAMINS THER. W/MINERALS) TABS  Take 1 tablet by mouth daily.     Yes Historical Provider, MD  Omega-3 Fatty Acids (FISH OIL) 1000 MG CAPS Take 1,000 mg by mouth daily.    Yes Historical Provider, MD  rosuvastatin (CRESTOR) 5 MG tablet Take 5 mg by mouth daily at 6 PM.    Yes Historical Provider, MD   BP 163/72  Pulse 48  Temp(Src) 97.4 F (36.3 C) (Oral)  Resp 18  Ht 5\' 11"  (1.803 m)  Wt 200 lb (90.719 kg)  BMI 27.91 kg/m2  SpO2 99% Physical Exam  Nursing note and vitals reviewed. Constitutional: He is oriented to  person, place, and time. He appears well-developed and well-nourished.  HENT:  Head: Normocephalic and atraumatic.  Right Ear: External ear normal.  Left Ear: External ear normal.  Nose: Nose normal.  Mouth/Throat: Oropharynx is clear and moist. No oropharyngeal exudate.  Eyes: Conjunctivae and EOM are normal. Pupils are equal, round, and reactive to light.  Neck: Normal range of motion. Neck supple.  Cardiovascular: Normal rate, regular rhythm, normal heart sounds and intact distal pulses.  Exam reveals no gallop and no friction rub.   No murmur heard. Pulmonary/Chest: Effort normal and breath sounds normal. No respiratory distress. He has no wheezes.  Abdominal: Soft. Bowel sounds are normal. He exhibits no distension. There is no tenderness. There is no rebound and no guarding.  Musculoskeletal: Normal range of motion. He exhibits no edema and no tenderness.  Neurological: He is alert and oriented to person, place, and time.  alert, oriented x3 speech: normal in context and clarity memory: intact grossly cranial nerves II-XII: intact motor strength: full proximally and distally no involuntary movements or tremors sensation: intact to light touch diffusely  cerebellar: finger-to-nose and heel-to-shin intact gait: normal forwards and backwards, normal tandem gait   Skin: Skin is warm and dry.  Psychiatric: He has a normal mood and affect. His behavior is normal.    ED Course  Procedures (including critical care time) Labs Review Labs Reviewed  CBC WITH DIFFERENTIAL - Abnormal; Notable for the following:    Neutrophils Relative % 79 (*)    All other components within normal limits  COMPREHENSIVE METABOLIC PANEL - Abnormal; Notable for the following:    Glucose, Bld 107 (*)    GFR calc non Af Amer 60 (*)    GFR calc Af Amer 70 (*)    All other components within normal limits  TROPONIN I  URINALYSIS, ROUTINE W REFLEX MICROSCOPIC    Imaging Review Dg Chest 2  View  01/05/2014   CLINICAL DATA:  Dizziness.  Diaphoresis.  EXAM: CHEST  2 VIEW  COMPARISON:  Chest x-ray 08/22/2011.  FINDINGS: Tiny well-defined 4 mm nodule in the periphery of the right lung bases presumably a small calcified granuloma. Lung volumes are normal. No consolidative airspace disease. No pleural effusions. No pneumothorax. No pulmonary nodule or mass noted. Pulmonary vasculature and the cardiomediastinal silhouette are within normal limits.  IMPRESSION: 1.  No radiographic evidence of acute cardiopulmonary disease. 2. Tiny calcified granuloma in the lateral aspect of the right lung base incidentally noted.   Electronically Signed   By: Vinnie Langton M.D.   On: 01/05/2014 10:47   Ct Head Wo Contrast  01/05/2014   CLINICAL DATA:  Dizziness. Vertigo. Symptoms now resolved. Hypertension. Melanoma.  EXAM: CT HEAD WITHOUT CONTRAST  TECHNIQUE: Contiguous axial images were obtained from the base of the skull through the vertex without contrast.  COMPARISON:  MR brain 04/28/2012.  FINDINGS: Slight atrophy. Chronic microvascular ischemic change. No evidence for acute infarction, hemorrhage, mass lesion, hydrocephalus, or extra-axial fluid. Calvarium and scalp unremarkable. No sinus or mastoid disease.  IMPRESSION: No acute intracranial abnormality.  Similar appearance to priors.   Electronically Signed   By: Rolla Flatten M.D.   On: 01/05/2014 11:15     EKG Interpretation None       Date: 01/06/2014  Rate: 48  Rhythm: normal sinus rhythm  QRS Axis: normal  Intervals: normal  ST/T Wave abnormalities: normal  Conduction Disutrbances:none  Narrative Interpretation: No ST or T wave changes consistent with ischemia.    Old EKG Reviewed: unchanged   MDM   Final diagnoses:  Dizziness    10:19 AM 68 y.o. male with a history of TIA, hypertension who presents with dizziness which began at approximately 8 AM this morning upon awakening. He states that when he sat up he felt like the room was  spinning. He states that he felt nauseous and broke out in a cold sweat. His symptoms persisted for proximally 30 minutes and resolved spontaneously. He has some mild lightheadedness now but is otherwise asymptomatic. He is a normal neurologic exam. He is afebrile and mildly hypertensive here. He is also bradycardic. He states that he has known sinus bradycardia. Will get screening labwork and imaging.  1:10 PM: Pt remains asx. Labs non-contrib. No cardiac hx. Doubt CVA/TIA.  I have discussed the diagnosis/risks/treatment options with the patient and family and believe the pt to be eligible for discharge home to follow-up with pcp early next week. We also discussed returning to the ED immediately if new or worsening sx occur. We discussed the sx which are most concerning (e.g., cp, sob, further dizziness) that necessitate immediate return. Medications administered to the patient during their visit and any new prescriptions provided to the patient are listed below.  Medications given during this visit Medications  sodium chloride 0.9 % bolus 1,000 mL (1,000 mLs Intravenous New Bag/Given 01/05/14 1115)    Discharge Medication List as of 01/05/2014  1:13 PM       Shea Evans Ruthe Mannan, MD 01/06/14 [place], MD 01/06/14 [phone]

## 2014-01-05 NOTE — ED Notes (Signed)
Pt undressed, in gown, on monitor, continuous pulse oximetry and blood pressure cuff 

## 2014-01-05 NOTE — ED Notes (Signed)
Pt in ct 

## 2014-01-05 NOTE — Discharge Instructions (Signed)

## 2014-01-05 NOTE — ED Notes (Signed)
Pt here by ems for dizziness when he awoke and sat up on bed, sts he went back to bed and had a cold sweat, wife reported he was pale, now all symptoms have subsided, denies hx of vertigo, ems reports no orthostatic changes, pt does have hx of TIA

## 2014-02-05 ENCOUNTER — Encounter: Payer: Self-pay | Admitting: Interventional Cardiology

## 2014-10-18 ENCOUNTER — Other Ambulatory Visit: Payer: Self-pay | Admitting: Family Medicine

## 2014-10-18 DIAGNOSIS — R079 Chest pain, unspecified: Secondary | ICD-10-CM

## 2014-10-24 ENCOUNTER — Ambulatory Visit
Admission: RE | Admit: 2014-10-24 | Discharge: 2014-10-24 | Disposition: A | Discharge status: Home / Self Care | Payer: Medicare Other | Source: Ambulatory Visit | Attending: Family Medicine | Admitting: Family Medicine

## 2014-10-24 DIAGNOSIS — R079 Chest pain, unspecified: Secondary | ICD-10-CM

## 2015-03-17 ENCOUNTER — Other Ambulatory Visit: Payer: Self-pay

## 2015-09-17 ENCOUNTER — Other Ambulatory Visit: Payer: Self-pay | Admitting: Family Medicine

## 2015-09-17 DIAGNOSIS — J320 Chronic maxillary sinusitis: Secondary | ICD-10-CM

## 2015-09-23 ENCOUNTER — Ambulatory Visit
Admission: RE | Admit: 2015-09-23 | Discharge: 2015-09-23 | Disposition: A | Discharge status: Home / Self Care | Payer: BC Managed Care – PPO | Source: Ambulatory Visit | Attending: Family Medicine | Admitting: Family Medicine

## 2015-09-23 DIAGNOSIS — J320 Chronic maxillary sinusitis: Secondary | ICD-10-CM

## 2015-10-17 ENCOUNTER — Other Ambulatory Visit: Payer: Self-pay | Admitting: Neurology

## 2015-10-17 ENCOUNTER — Encounter: Payer: Self-pay | Admitting: Neurology

## 2015-10-17 ENCOUNTER — Ambulatory Visit (INDEPENDENT_AMBULATORY_CARE_PROVIDER_SITE_OTHER): Payer: 59 | Admitting: Neurology

## 2015-10-17 DIAGNOSIS — H5789 Other specified disorders of eye and adnexa: Secondary | ICD-10-CM

## 2015-10-17 DIAGNOSIS — H1589 Other disorders of sclera: Secondary | ICD-10-CM

## 2015-10-17 DIAGNOSIS — G08 Intracranial and intraspinal phlebitis and thrombophlebitis: Secondary | ICD-10-CM | POA: Diagnosis not present

## 2015-10-17 DIAGNOSIS — J329 Chronic sinusitis, unspecified: Secondary | ICD-10-CM

## 2015-10-17 DIAGNOSIS — I770 Arteriovenous fistula, acquired: Secondary | ICD-10-CM | POA: Diagnosis not present

## 2015-10-17 DIAGNOSIS — H578 Other specified disorders of eye and adnexa: Secondary | ICD-10-CM | POA: Diagnosis not present

## 2015-10-17 DIAGNOSIS — H11413 Vascular abnormalities of conjunctiva, bilateral: Secondary | ICD-10-CM

## 2015-10-17 DIAGNOSIS — I671 Cerebral aneurysm, nonruptured: Secondary | ICD-10-CM

## 2015-10-17 NOTE — Patient Instructions (Signed)
Remember to drink plenty of fluid, eat healthy meals and do not skip any meals. Try to eat protein with a every meal and eat a healthy snack such as fruit or nuts in between meals. Try to keep a regular sleep-wake schedule and try to exercise daily, particularly in the form of walking, 20-30 minutes a day, if you can.   As far as diagnostic testing: MRi of the brain, MRA of the head  I would like to see you back after imaging, sooner if we need to. Please call us with any interim questions, concerns, problems, updates or refill requests.   Please also call us for any test results so we can go over those with you on the phone.  My clinical assistant and will answer any of your questions and relay your messages to me and also relay most of my messages to you.   Our phone number is [phone]. We also have an after hours call service for urgent matters and there is a physician on-call for urgent questions. For any emergencies you know to call 911 or go to the nearest emergency room

## 2015-10-17 NOTE — Progress Notes (Signed)
[facility]    Provider:  Dr Jaynee Eagles Referring Provider: Shirline Frees, MD Primary Care Physician:  Shirline Frees, MD  CC:  Eye pain  HPI:  Connor Adams is a 70 y.o. male here as a referral from Dr. Kenton Adams for eye pain. Past medical history of hypertension, high cholesterol, asthma, sleep apnea. Back in October he had an elevated WBC and a sinus infection and was treated with Abx. A few days he felt better then he had swelling and blackness around the eyes. Last Tuesday he saw the ENT and a CT scan of the sinuses showed nothing. No headache whatsoever. He is having significant blurred vision. No photophobia, phonophobia, no nausea, no vomiting. No history of migraines or headaches. He stays active. He has a history of retinal laser treatment in the past and cataract removal last September. He has not seen Dr. Digby(ophthalmology) again since these symptoms started. He went to ENt and CT of the sinuses negative.  The current symptoms are symmetric in both eyes, dark circles around the eyes, swollen, puffy, pressure behind both eyes, daily and some days is worse than others, no injection in the eye, no tearing. No known triggers but sometimes the pressure is worse than other times. No headache, no pain with eye movement, just swelling and the dark circles.   Reviewed notes, labs and imaging from outside physicians, which showed: Patient was evaluated at [facility] for facial pain swelling and pressure in subjective swelling under the eyes and pressure behind the eyes. He was treated for white blood cells of 15 a few weeks ago with a Z-Pak and white count normalized to 4.5. Since then he is experiencing eye pain, pressure.. He has a history of nasal septoplasty, facial swelling, facial pain concha bullosa. Completely normal sinuses and multiple CT scans back to 2015. Patient's assessment was that he definitely does not have chronic sinusitis. His  exam is normal other than surgically resected inferior turbinates.  CT of the head: 12/2013 Personally reviewed images and agree with the following:: Slight atrophy. Chronic microvascular ischemic change. No evidence for acute infarction, hemorrhage, mass lesion, hydrocephalus, or extra-axial fluid. Calvarium and scalp unremarkable. No sinus or mastoid disease.  IMPRESSION: No acute intracranial abnormality. Similar appearance to priors.  MRI of the brain 2013: personally reviewed images and agree with the following: No acute infarct.  Moderate white matter type changes most likely related to result of small vessel disease in this hypertensive patient with hyperlipidemia.  No intracranial mass lesion detected on this unenhanced exam. Contrast enhanced imaging would prove more sensitive for detection of intracranial metastatic disease.  Global atrophy without hydrocephalus.  Major intracranial vascular structures are patent. 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 Pertinent negatives per HPI. All others negative.   Social History   Social History  . Marital Status: Married    Spouse Name: Connor Adams  . Number of Children: 2  . Years of Education: 12   Occupational History  . Instructor  [employer]   Social History Main Topics  . Smoking status: Never Smoker   . Smokeless tobacco: Not on file  . Alcohol Use: No  . Drug Use: No  . Sexual Activity: Not on file  Other Topics Concern  . Not on file   Social History Narrative   Married to [employer] at [employer].   Son, Connor Adams married   Son, Connor Adams, Married.    Caffeine use: 1 cup coffee per day    Family History   Problem Relation Age of Onset  . Heart attack Mother 53  . Heart attack Father 71  . Heart failure Brother 44    CABGx4  . Asthma Son   . Diabetes Brother     Past Medical History  Diagnosis Date  . TIA (transient ischemic attack) 4/11    associated w slurred speecha ndmild facial droop. lasted for about 10 minutes. Had MRI, etc with [facility] that per his report was ok (dr. Stephannie Adams). Echo (4/11): EF 55-60%, no regional WMAs, normal diastolic function, mild LAE, no source of embolus  . HTN (hypertension)   . Melanoma ([place])     excied  . History of ETT 3/05    low risk  . Hemorrhoids   . Left inguinal hernia   . HLD (hyperlipidemia)   . Sciatica     from lumbar disc disease    Past Surgical History  Procedure Laterality Date  . Bmi 30.2 muscular  12/08/04  . Curretage actinic keratosis r ear  01/20/05  . Ett low prob of cad  11/21/03  . Excision melanoma in situ back  01/20/05  . Hemorrhoids sclerosed at colonoscopy  12/19/05  . Laparoscopic inguinal hernia repair  12/20/95  . Retinal laser surgery  09/20/86  . Septoplasty  11/19/98  . Uric acid 6.9  12/22/04  . X-ray l great toe  07/21/00    Current Outpatient Prescriptions  Medication Sig Dispense Refill  . aspirin 325 MG tablet Take 325 mg by mouth daily.     . fluticasone (FLONASE) 50 MCG/ACT nasal spray Place 1 spray into both nostrils as needed.     Marland Kitchen ibuprofen (ADVIL,MOTRIN) 200 MG tablet Take 400 mg by mouth daily as needed. For  pain    . metoprolol tartrate (LOPRESSOR) 25 MG tablet Take 25 mg by mouth daily.     . Multiple Vitamins-Minerals (MULTIVITAMINS THER. W/MINERALS) TABS Take 1 tablet by mouth daily.      . Omega-3 Fatty Acids (FISH OIL) 1000 MG CAPS Take 1,000 mg by mouth daily.     Marland Kitchen PROAIR HFA 108 (90 Base) MCG/ACT inhaler Inhale 2 puffs into the lungs as needed.     . rosuvastatin (CRESTOR) 5 MG tablet Take 5 mg by mouth daily at 6 PM.      No current facility-administered medications for this visit.      Allergies as of 10/17/2015  . (No Known Allergies)    Vitals: BP 189/69 mmHg  Pulse 47  Ht 5\' 11"  (1.803 m)  Wt 208 lb 12.8 oz (94.711 kg)  BMI 29.13 kg/m2 Last Weight:  Wt Readings from Last 1 Encounters:  10/17/15 208 lb 12.8 oz (94.711 kg)   Last Height:   Ht Readings from Last 1 Encounters:  10/17/15 5\' 11"  (1.803 m)   Physical exam: Exam: Gen: NAD, conversant, well nourised, obese, well groomed                     CV: RRR, no MRG. No Carotid Bruits. No peripheral edema, warm, nontender Eyes: Mild injection eyes, dark circles under the eyes  Neuro: Detailed Neurologic Exam  Speech:    Speech is normal; fluent  and spontaneous with normal comprehension.  Cognition:    The patient is oriented to person, place, and time;     recent and remote memory intact;     language fluent;     normal attention, concentration,     fund of knowledge Cranial Nerves:    The pupils are equal, round, and reactive to light. The fundi are flat. Visual fields are full to finger confrontation. Extraocular movements are intact. Trigeminal sensation is intact and the muscles of mastication are normal. The face is symmetric. The palate elevates in the midline. Hearing intact. Voice is normal. Shoulder shrug is normal. The tongue has normal motion without fasciculations.   Coordination:    Normal finger to nose and heel to shin. Normal rapid alternating movements.   Gait:    Heel-toe and tandem gait are normal.   Motor Observation:    No asymmetry, no atrophy, and no involuntary movements noted. Tone:    Normal muscle tone.    Posture:    Posture is normal. normal erect    Strength:    Strength is V/V in the upper and lower limbs.      Sensation: intact to LT     Reflex Exam:  DTR's:    Deep tendon reflexes in the upper and lower extremities are normal bilaterally.   Toes:    The toes are downgoing bilaterally.   Clonus:    Clonus is absent.       Assessment/Plan:   70 year old male with eye swelling, injection after sinus infection. No pain, no headache, no vision changes. He was sent to neurology for evaluation of migraines, not consistent with a migraine disorder and he has no history of migraines. Will order and MRI of the brain and an MRA of the head to evaluate for venous sinus thrombosis or cavernous sinus fistula. Will check a bmp for remal function to administer contrast.  Sarina Ill, MD  [facility] [address] [place], [place] [address]  Phone [phone] Fax [phone]

## 2015-10-18 LAB — BASIC METABOLIC PANEL
BUN/Creatinine Ratio: 17 (ref 10–22)
BUN: 22 mg/dL (ref 8–27)
CO2: 24 mmol/L (ref 18–29)
Calcium: 9.7 mg/dL (ref 8.6–10.2)
Chloride: 102 mmol/L (ref 96–106)
Creatinine, Ser: 1.26 mg/dL (ref 0.76–1.27)
GFR calc Af Amer: 67 mL/min/{1.73_m2} (ref >59)
GFR calc non Af Amer: 58 mL/min/{1.73_m2} — ABNORMAL LOW (ref >59)
Glucose: 107 mg/dL — ABNORMAL HIGH (ref 65–99)
Potassium: 4.5 mmol/L (ref 3.5–5.2)
Sodium: 144 mmol/L (ref 134–144)

## 2015-10-19 ENCOUNTER — Encounter: Payer: Self-pay | Admitting: Neurology

## 2015-10-20 ENCOUNTER — Telehealth: Payer: Self-pay | Admitting: *Deleted

## 2015-10-20 NOTE — Telephone Encounter (Signed)
-----   Message from Melvenia Beam, MD sent at 10/18/2015 10:34 PM EST ----- Labs unremarkable thanks

## 2015-10-20 NOTE — Telephone Encounter (Signed)
LVM for pt to call about results. Ok to inform pt labs unremarkable per Dr Jaynee Eagles.

## 2015-10-20 NOTE — Telephone Encounter (Signed)
Pt returned Emma's call. Msg relayed, pt understood an was very happy.

## 2015-10-22 ENCOUNTER — Ambulatory Visit
Admission: RE | Admit: 2015-10-22 | Discharge: 2015-10-22 | Disposition: A | Discharge status: Home / Self Care | Payer: Medicare Other | Source: Ambulatory Visit | Attending: Neurology | Admitting: Neurology

## 2015-10-22 DIAGNOSIS — H1589 Other disorders of sclera: Secondary | ICD-10-CM

## 2015-10-22 DIAGNOSIS — I671 Cerebral aneurysm, nonruptured: Secondary | ICD-10-CM

## 2015-10-22 DIAGNOSIS — J329 Chronic sinusitis, unspecified: Secondary | ICD-10-CM

## 2015-10-22 DIAGNOSIS — H578 Other specified disorders of eye and adnexa: Secondary | ICD-10-CM | POA: Diagnosis not present

## 2015-10-22 DIAGNOSIS — G08 Intracranial and intraspinal phlebitis and thrombophlebitis: Secondary | ICD-10-CM

## 2015-10-22 DIAGNOSIS — H5789 Other specified disorders of eye and adnexa: Secondary | ICD-10-CM

## 2015-10-22 MED ORDER — GADOBENATE DIMEGLUMINE 529 MG/ML IV SOLN
19.0000 mL | Freq: Once | INTRAVENOUS | Status: AC | PRN
  Administered 2015-10-22: 19 mL via INTRAVENOUS

## 2015-10-22 MED ORDER — GADOBENATE DIMEGLUMINE 529 MG/ML IV SOLN: 20.0000 mL | Freq: Once | INTRAVENOUS | Status: DC | PRN

## 2015-10-28 ENCOUNTER — Telehealth: Payer: Self-pay | Admitting: *Deleted

## 2015-10-28 NOTE — Telephone Encounter (Signed)
-----   Message from Melvenia Beam, MD sent at 10/27/2015  7:48 PM EST ----- There were no changes on MRI of the brain as compared to 2013. No reason for patient's eye swelling found on MRI. Will be happy to see him in follow up though and show him the images.

## 2015-10-28 NOTE — Telephone Encounter (Signed)
Called and spoke to patient about results per Dr Jaynee Eagles note below. He declined f/u appt to review images w/ Dr Jaynee Eagles at this time. States he feels better right now. He will call to make appt if needed.

## 2016-11-26 ENCOUNTER — Other Ambulatory Visit: Payer: Self-pay | Admitting: Orthopedic Surgery

## 2016-12-03 ENCOUNTER — Encounter (HOSPITAL_COMMUNITY): Payer: Self-pay

## 2016-12-03 ENCOUNTER — Encounter (HOSPITAL_COMMUNITY)
Admission: RE | Admit: 2016-12-03 | Discharge: 2016-12-03 | Disposition: A | Discharge status: Home / Self Care | Payer: Medicare Other | Source: Ambulatory Visit | Attending: Orthopedic Surgery | Admitting: Orthopedic Surgery

## 2016-12-03 ENCOUNTER — Ambulatory Visit (HOSPITAL_COMMUNITY): Admission: RE | Admit: 2016-12-03 | Payer: Medicare Other | Source: Ambulatory Visit

## 2016-12-03 DIAGNOSIS — N183 Chronic kidney disease, stage 3 (moderate): Secondary | ICD-10-CM | POA: Diagnosis not present

## 2016-12-03 DIAGNOSIS — Z8673 Personal history of transient ischemic attack (TIA), and cerebral infarction without residual deficits: Secondary | ICD-10-CM | POA: Insufficient documentation

## 2016-12-03 DIAGNOSIS — Z01818 Encounter for other preprocedural examination: Secondary | ICD-10-CM

## 2016-12-03 DIAGNOSIS — K219 Gastro-esophageal reflux disease without esophagitis: Secondary | ICD-10-CM | POA: Diagnosis not present

## 2016-12-03 DIAGNOSIS — Z01812 Encounter for preprocedural laboratory examination: Secondary | ICD-10-CM | POA: Insufficient documentation

## 2016-12-03 DIAGNOSIS — I129 Hypertensive chronic kidney disease with stage 1 through stage 4 chronic kidney disease, or unspecified chronic kidney disease: Secondary | ICD-10-CM | POA: Diagnosis not present

## 2016-12-03 DIAGNOSIS — E785 Hyperlipidemia, unspecified: Secondary | ICD-10-CM | POA: Diagnosis not present

## 2016-12-03 DIAGNOSIS — R001 Bradycardia, unspecified: Secondary | ICD-10-CM | POA: Insufficient documentation

## 2016-12-03 DIAGNOSIS — Z01810 Encounter for preprocedural cardiovascular examination: Secondary | ICD-10-CM | POA: Insufficient documentation

## 2016-12-03 HISTORY — DX: Gastro-esophageal reflux disease without esophagitis: K21.9

## 2016-12-03 HISTORY — DX: Chronic kidney disease, stage 3 (moderate): N18.3

## 2016-12-03 HISTORY — DX: Chronic kidney disease, stage 3 unspecified: N18.30

## 2016-12-03 HISTORY — DX: Personal history of urinary calculi: Z87.442

## 2016-12-03 HISTORY — DX: Cerebral infarction, unspecified: I63.9

## 2016-12-03 HISTORY — DX: Personal history of other diseases of the digestive system: Z87.19

## 2016-12-03 HISTORY — DX: Unspecified osteoarthritis, unspecified site: M19.90

## 2016-12-03 LAB — CBC WITH DIFFERENTIAL/PLATELET
Basophils Absolute: 0.1 10*3/uL (ref 0.0–0.1)
Basophils Relative: 1 %
Eosinophils Absolute: 0.2 10*3/uL (ref 0.0–0.7)
Eosinophils Relative: 3 %
HCT: 45.7 % (ref 39.0–52.0)
Hemoglobin: 15.1 g/dL (ref 13.0–17.0)
Lymphocytes Relative: 22 %
Lymphs Abs: 1.2 10*3/uL (ref 0.7–4.0)
MCH: 31.9 pg (ref 26.0–34.0)
MCHC: 33 g/dL (ref 30.0–36.0)
MCV: 96.4 fL (ref 78.0–100.0)
Monocytes Absolute: 0.5 10*3/uL (ref 0.1–1.0)
Monocytes Relative: 9 %
Neutro Abs: 3.5 10*3/uL (ref 1.7–7.7)
Neutrophils Relative %: 65 %
Platelets: 177 10*3/uL (ref 150–400)
RBC: 4.74 MIL/uL (ref 4.22–5.81)
RDW: 13.2 % (ref 11.5–15.5)
WBC: 5.5 10*3/uL (ref 4.0–10.5)

## 2016-12-03 LAB — URINALYSIS, ROUTINE W REFLEX MICROSCOPIC
Bilirubin Urine: NEGATIVE
Glucose, UA: NEGATIVE mg/dL
Hgb urine dipstick: NEGATIVE
Ketones, ur: NEGATIVE mg/dL
Leukocytes, UA: NEGATIVE
Nitrite: NEGATIVE
Protein, ur: NEGATIVE mg/dL
Specific Gravity, Urine: 1.018 (ref 1.005–1.030)
pH: 6 (ref 5.0–8.0)

## 2016-12-03 LAB — BASIC METABOLIC PANEL
Anion gap: 7 (ref 5–15)
BUN: 21 mg/dL — ABNORMAL HIGH (ref 6–20)
CO2: 28 mmol/L (ref 22–32)
Calcium: 9.6 mg/dL (ref 8.9–10.3)
Chloride: 107 mmol/L (ref 101–111)
Creatinine, Ser: 1.35 mg/dL — ABNORMAL HIGH (ref 0.61–1.24)
GFR calc Af Amer: 60 mL/min — ABNORMAL LOW (ref >60)
GFR calc non Af Amer: 52 mL/min — ABNORMAL LOW (ref >60)
Glucose, Bld: 110 mg/dL — ABNORMAL HIGH (ref 65–99)
Potassium: 4.2 mmol/L (ref 3.5–5.1)
Sodium: 142 mmol/L (ref 135–145)

## 2016-12-03 LAB — APTT: aPTT: 26 seconds (ref 24–36)

## 2016-12-03 LAB — TYPE AND SCREEN
ABO/RH(D): A POS
Antibody Screen: NEGATIVE

## 2016-12-03 LAB — SURGICAL PCR SCREEN
MRSA, PCR: NEGATIVE
Staphylococcus aureus: POSITIVE — AB

## 2016-12-03 LAB — ABO/RH: ABO/RH(D): A POS

## 2016-12-03 LAB — PROTIME-INR
INR: 1.05
Prothrombin Time: 13.7 seconds (ref 11.4–15.2)

## 2016-12-03 NOTE — Pre-Procedure Instructions (Addendum)
Connor Adams  12/03/2016      [facility] - [place] - [address] [address] [place] [address] Phone: [phone] Fax: [phone]    Your procedure is scheduled on Monday, March 26.  Report to [facility] Admitting at 10:35 AM              Your surgery or procedure is scheduled for 12:35 PM   Call this number if you have problems the morning of surgery: [phone]                 For any other questions, please call [phone], Monday - Friday 8 AM - 4 PM.   Remember:  Do not eat food or drink liquids after midnight Sunday, March 25.  Take these medicines the morning of surgery with A SIP OF WATER : metoprolol tartrate (LOPRESSOR).              May use Flonase nasal spray if needed.  May use Albuterol Inhaler if needed and bring it to the hospital with you.                    1 Week prior to surgery STOP taking Aspirin (unless instructed differently by Dr Mayer Camel), Aspirin Products (Goody Powder, Excedrin Migraine), Ibuprofen (Advil), Naproxen (Aleve), Vitamins and Herbal Products (ie Fish Oil)                    [facility]- Preparing For Surgery  Before surgery, you can play an important role. Because skin is not sterile, your skin needs to be as free of germs as possible. You can reduce the number of germs on your skin by washing with CHG (chlorahexidine gluconate) Soap before surgery.  CHG is an antiseptic cleaner which kills germs and bonds with the skin to continue killing germs even after washing.  Please do not use if you have an allergy to CHG or antibacterial soaps. If your skin becomes reddened/irritated stop using the CHG.  Do not shave (including legs and underarms) for at least 48 hours prior to first CHG shower. It is OK to shave your face.  Please follow these instructions carefully.   1. Shower the NIGHT BEFORE SURGERY and the MORNING OF SURGERY with CHG.   2. If you chose to wash  your hair, wash your hair first as usual with your normal shampoo.  3. After you shampoo, rinse your hair and body thoroughly to remove the shampoo.  4. Use CHG as you would any other liquid soap. You can apply CHG directly to the skin and wash gently with a scrungie or a clean washcloth.   5. Apply the CHG Soap to your body ONLY FROM THE NECK DOWN.  Do not use on open wounds or open sores. Avoid contact with your eyes, ears, mouth and genitals (private parts). Wash genitals (private parts) with your normal soap.  6. Wash thoroughly, paying special attention to the area where your surgery will be performed.  7. Thoroughly rinse your body with warm water from the neck down.  8. DO NOT shower/wash with your normal soap after using and rinsing off the CHG Soap.  9. Pat yourself dry with a CLEAN TOWEL.   10. Wear CLEAN PAJAMAS   11. Place CLEAN SHEETS on your bed the night of your first shower and DO NOT SLEEP WITH PETS.  Day of Surgery: Do not  apply any deodorants/lotions. Please wear clean clothes to the hospital/surgery center   Do not wear jewelry, make-up or nail polish.  Do not wear lotions, powders, or perfumes, or deodorant.  Do not shave 48 hours prior to surgery.  Men may shave face and neck.  Do not bring valuables to the hospital.  [facility] is not responsible for any belongings or valuables.  Contacts, dentures or bridgework may not be worn into surgery.  Leave your suitcase in the car.  After surgery it may be brought to your room.  For patients admitted to the hospital, discharge time will be determined by your treatment team.  Please read over the following fact sheets that you were given:  Patient Instructions for Mupirocin Application, Incentive Spirometry, Pain Booklet, Surgical Site Infection

## 2016-12-08 ENCOUNTER — Encounter (HOSPITAL_COMMUNITY): Payer: Self-pay

## 2016-12-08 NOTE — Progress Notes (Addendum)
Anesthesia Chart Review:  Pt is a 71 year old male scheduled for R total knee arthroplasty with L cortisone injection on 12/13/2016 with Frederik Pear, M.D.  - PCP is Shirline Frees, MD, last office visit 04/05/16  PMH includes: HTN, TIA, hyperlipidemia, CKD (stage 3), melanoma, GERD. Never smoker. BMI 28.  Medications include: ASA 325 mg, metoprolol, albuterol, rosuvastatin.  BP (!) 170/77   Pulse (!) 55   Temp 36.7 C   Resp 20   Ht 5\' 11"  (1.803 m)   Wt 202 lb (91.6 kg)   SpO2 96%   BMI 28.17 kg/m    Preoperative labs reviewed.  EKG 12/03/16: Sinus bradycardia (45 bpm).  - HR consistent with prior documented HR in Epic  Echo 01/01/10:  - Left ventricle: The cavity size was normal. Wall thickness was normal. Systolic function was normal. The estimated ejection fraction was in the range of 55% to 60%. Wall motion was normal; there were no regional wall motion abnormalities. Left ventricular diastolic function parameters were normal. - Left atrium: The atrium was mildly dilated.  If no changes, I anticipate pt can proceed with surgery as scheduled.   Willeen Cass, FNP-BC [facility]/Anesthesiology Phone: [phone] 12/08/2016 4:50 PM

## 2016-12-09 DIAGNOSIS — M1712 Unilateral primary osteoarthritis, left knee: Secondary | ICD-10-CM

## 2016-12-09 NOTE — H&P (Signed)
TOTAL KNEE ADMISSION H&P  Patient is being admitted for left total knee arthroplasty.  Subjective:  Chief Complaint:left knee pain.  HPI: Connor Adams, 71 y.o. male, has a history of pain and functional disability in the left knee due to arthritis and has failed non-surgical conservative treatments for greater than 12 weeks to includeNSAID's and/or analgesics, corticosteriod injections, viscosupplementation injections, flexibility and strengthening excercises, use of assistive devices, weight reduction as appropriate and activity modification.  Onset of symptoms was gradual, starting 4 years ago with gradually worsening course since that time. The patient noted prior procedures on the knee to include  arthroscopy on the left knee(s).  Patient currently rates pain in the left knee(s) at 10 out of 10 with activity. Patient has night pain, worsening of pain with activity and weight bearing, pain that interferes with activities of daily living, pain with passive range of motion, crepitus and joint swelling.  Patient has evidence of joint space narrowing by imaging studies.  There is no active infection.  Patient Active Problem List   Diagnosis Date Noted  . HYPERTRIGLYCERIDEMIA 11/17/2006  . HYPERLIPIDEMIA 11/17/2006  . 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 HEARING LOSS NOS OR DEAFNESS 11/17/2006  . HYPERTENSION, BENIGN SYSTEMIC 11/17/2006  . RHINITIS, ALLERGIC 11/17/2006  . ASTHMA, UNSPECIFIED 11/17/2006  . NEPHROLITHIASIS 11/17/2006  . ACTINIC DERMATITIS 11/17/2006  . APNEA, SLEEP 11/17/2006   Past Medical History:  Diagnosis Date  . Arthritis   . Chronic kidney disease (CKD), stage III (moderate)   . GERD (gastroesophageal reflux disease)    occ  . Hemorrhoids   . History of ETT 3/05   low risk  . History of hiatal hernia   . History of kidney stones   . HLD (hyperlipidemia)   . HTN (hypertension)   . Left inguinal hernia   . Melanoma ([place])    excied  . Sciatica    from lumbar disc  disease  . Stroke [facility]) 2011   tia   . TIA (transient ischemic attack) 4/11   associated w slurred speecha ndmild facial droop. lasted for about 10 minutes. Had MRI, etc with [facility] that per his report was ok (dr. Stephannie Li). Echo (4/11): EF 55-60%, no regional WMAs, normal diastolic function, mild LAE, no source of embolus    Past Surgical History:  Procedure Laterality Date  . BACK SURGERY    . BMI 30.2 muscular  12/08/04  . curretage actinic keratosis R ear  01/20/05  . ETT low prob of CAD  11/21/03  . excision melanoma in situ back  01/20/05  . hemorrhoids sclerosed at colonoscopy  12/19/05  . LAPAROSCOPIC INGUINAL HERNIA REPAIR  12/20/95  . retinal laser surgery  09/20/86  . SEPTOPLASTY  11/19/98  . uric acid 6.9  12/22/04  . x-ray l great toe  07/21/00    No prescriptions prior to admission.   No Known Allergies  Social History  Substance Use Topics  . Smoking status: Never Smoker  . Smokeless tobacco: Never Used  . Alcohol use No    Family History  Problem Relation Age of Onset  . Heart failure Brother 44    CABGx4  . Heart attack Mother 24  . Heart attack Father 66  . Asthma Son   . Diabetes Brother      Review of Systems  Musculoskeletal: Positive for joint pain.  All other systems reviewed and are negative.   Objective:  Physical Exam  Constitutional: He appears well-developed and well-nourished.  HENT:  Head: Normocephalic and atraumatic.  Eyes: Pupils are equal, round, and reactive to light.  Neck: Normal range of motion. Neck supple.  Cardiovascular: Intact distal pulses.   Respiratory: Effort normal.  Musculoskeletal: He exhibits tenderness.  He has good strength and good range of motion in the right knee.  Patient's left knee has a range from roughly 5 to 100.  No instability.  Tenderness over the medial and lateral joint lines.  Obvious crepitus with range of motion.  No instability.  Calves are soft and nontender.  He is neurovascularly intact  distally.  Skin: Skin is warm and dry.  Psychiatric: He has a normal mood and affect. His behavior is normal. Judgment and thought content normal.    Vital signs in last 24 hours:    Labs:   Estimated body mass index is 28.17 kg/m as calculated from the following:   Height as of 12/03/16: 5\' 11"  (1.803 m).   Weight as of 12/03/16: 91.6 kg (202 lb).   Imaging Review Plain radiographs demonstrate  AP, Rosenberg, lateral and sunrise x-rays show bone-on-bone arthritic changes on the Jay view medial compartment of the left knee, moderate to severe arthritis of the patellofemoral compartment as well  Assessment/Plan:  End stage arthritis, left knee   The patient history, physical examination, clinical judgment of the provider and imaging studies are consistent with end stage degenerative joint disease of the left knee(s) and total knee arthroplasty is deemed medically necessary. The treatment options including medical management, injection therapy arthroscopy and arthroplasty were discussed at length. The risks and benefits of total knee arthroplasty were presented and reviewed. The risks due to aseptic loosening, infection, stiffness, patella tracking problems, thromboembolic complications and other imponderables were discussed. The patient acknowledged the explanation, agreed to proceed with the plan and consent was signed. Patient is being admitted for inpatient treatment for surgery, pain control, PT, OT, prophylactic antibiotics, VTE prophylaxis, progressive ambulation and ADL's and discharge planning. The patient is planning to be discharged home with home health services

## 2016-12-10 MED ORDER — BUPIVACAINE LIPOSOME 1.3 % IJ SUSP
20.0000 mL | Freq: Once | INTRAMUSCULAR | Status: AC
  Administered 2016-12-13: 20 mL
  Filled 2016-12-10: qty 20

## 2016-12-10 MED ORDER — SODIUM CHLORIDE 0.9 % IV SOLN
2000.0000 mg | INTRAVENOUS | Status: DC
  Filled 2016-12-10: qty 20

## 2016-12-10 MED ORDER — TRANEXAMIC ACID 1000 MG/10ML IV SOLN
1000.0000 mg | INTRAVENOUS | Status: AC
  Administered 2016-12-13: 1000 mg via INTRAVENOUS
  Filled 2016-12-10: qty 10

## 2016-12-12 MED ORDER — CEFAZOLIN SODIUM-DEXTROSE 2-4 GM/100ML-% IV SOLN
2.0000 g | INTRAVENOUS | Status: AC
  Administered 2016-12-13: 2 g via INTRAVENOUS
  Filled 2016-12-12: qty 100

## 2016-12-13 ENCOUNTER — Encounter (HOSPITAL_COMMUNITY): Payer: Self-pay | Admitting: Certified Registered"

## 2016-12-13 ENCOUNTER — Inpatient Hospital Stay (HOSPITAL_COMMUNITY): Payer: Medicare Other | Admitting: Emergency Medicine

## 2016-12-13 ENCOUNTER — Inpatient Hospital Stay (HOSPITAL_COMMUNITY): Payer: Medicare Other | Admitting: Certified Registered"

## 2016-12-13 ENCOUNTER — Encounter (HOSPITAL_COMMUNITY)
Admission: RE | Disposition: A | Discharge status: Home Health | Payer: Self-pay | Source: Ambulatory Visit | Attending: Orthopedic Surgery

## 2016-12-13 ENCOUNTER — Inpatient Hospital Stay (HOSPITAL_COMMUNITY)
Admission: RE | Admit: 2016-12-13 | Discharge: 2016-12-15 | DRG: 470 | Disposition: A | Discharge status: Home Health | Payer: Medicare Other | Source: Ambulatory Visit | Attending: Orthopedic Surgery | Admitting: Orthopedic Surgery

## 2016-12-13 DIAGNOSIS — G4730 Sleep apnea, unspecified: Secondary | ICD-10-CM | POA: Diagnosis present

## 2016-12-13 DIAGNOSIS — M109 Gout, unspecified: Secondary | ICD-10-CM | POA: Diagnosis present

## 2016-12-13 DIAGNOSIS — E781 Pure hyperglyceridemia: Secondary | ICD-10-CM | POA: Diagnosis present

## 2016-12-13 DIAGNOSIS — Z825 Family history of asthma and other chronic lower respiratory diseases: Secondary | ICD-10-CM

## 2016-12-13 DIAGNOSIS — Z8673 Personal history of transient ischemic attack (TIA), and cerebral infarction without residual deficits: Secondary | ICD-10-CM

## 2016-12-13 DIAGNOSIS — M25762 Osteophyte, left knee: Secondary | ICD-10-CM | POA: Diagnosis present

## 2016-12-13 DIAGNOSIS — Z85820 Personal history of malignant melanoma of skin: Secondary | ICD-10-CM

## 2016-12-13 DIAGNOSIS — E785 Hyperlipidemia, unspecified: Secondary | ICD-10-CM | POA: Diagnosis present

## 2016-12-13 DIAGNOSIS — Z87442 Personal history of urinary calculi: Secondary | ICD-10-CM

## 2016-12-13 DIAGNOSIS — K219 Gastro-esophageal reflux disease without esophagitis: Secondary | ICD-10-CM | POA: Diagnosis present

## 2016-12-13 DIAGNOSIS — J45909 Unspecified asthma, uncomplicated: Secondary | ICD-10-CM | POA: Diagnosis present

## 2016-12-13 DIAGNOSIS — M170 Bilateral primary osteoarthritis of knee: Principal | ICD-10-CM | POA: Diagnosis present

## 2016-12-13 DIAGNOSIS — D62 Acute posthemorrhagic anemia: Secondary | ICD-10-CM | POA: Diagnosis not present

## 2016-12-13 DIAGNOSIS — N183 Chronic kidney disease, stage 3 (moderate): Secondary | ICD-10-CM | POA: Diagnosis present

## 2016-12-13 DIAGNOSIS — I129 Hypertensive chronic kidney disease with stage 1 through stage 4 chronic kidney disease, or unspecified chronic kidney disease: Secondary | ICD-10-CM | POA: Diagnosis present

## 2016-12-13 DIAGNOSIS — M1712 Unilateral primary osteoarthritis, left knee: Secondary | ICD-10-CM

## 2016-12-13 DIAGNOSIS — M25562 Pain in left knee: Secondary | ICD-10-CM | POA: Diagnosis present

## 2016-12-13 DIAGNOSIS — Z8249 Family history of ischemic heart disease and other diseases of the circulatory system: Secondary | ICD-10-CM

## 2016-12-13 HISTORY — PX: TOTAL KNEE ARTHROPLASTY: SHX125

## 2016-12-13 SURGERY — ARTHROPLASTY, KNEE, TOTAL
Anesthesia: Spinal | Site: Knee | Laterality: Left

## 2016-12-13 MED ORDER — BUPIVACAINE HCL (PF) 0.25 % IJ SOLN
INTRAMUSCULAR | Status: AC
  Filled 2016-12-13: qty 30

## 2016-12-13 MED ORDER — ACETAMINOPHEN 650 MG RE SUPP: 650.0000 mg | Freq: Four times a day (QID) | RECTAL | Status: DC | PRN

## 2016-12-13 MED ORDER — SODIUM CHLORIDE 0.9 % IR SOLN
Status: DC | PRN
  Administered 2016-12-13: 1000 mL

## 2016-12-13 MED ORDER — TIZANIDINE HCL 2 MG PO TABS: 2.0000 mg | ORAL_TABLET | Freq: Four times a day (QID) | ORAL | 0 refills | Status: DC | PRN

## 2016-12-13 MED ORDER — ACETAMINOPHEN 325 MG PO TABS
650.0000 mg | ORAL_TABLET | Freq: Four times a day (QID) | ORAL | Status: DC | PRN
  Administered 2016-12-15: 650 mg via ORAL
  Filled 2016-12-13: qty 2

## 2016-12-13 MED ORDER — BUPIVACAINE-EPINEPHRINE (PF) 0.25% -1:200000 IJ SOLN
INTRAMUSCULAR | Status: DC | PRN
  Administered 2016-12-13: 50 mL via PERINEURAL

## 2016-12-13 MED ORDER — METOCLOPRAMIDE HCL 5 MG/ML IJ SOLN: 5.0000 mg | Freq: Three times a day (TID) | INTRAMUSCULAR | Status: DC | PRN

## 2016-12-13 MED ORDER — METHOCARBAMOL 1000 MG/10ML IJ SOLN
500.0000 mg | Freq: Four times a day (QID) | INTRAVENOUS | Status: DC | PRN
  Filled 2016-12-13: qty 5

## 2016-12-13 MED ORDER — OXYCODONE HCL 5 MG PO TABS
ORAL_TABLET | ORAL | Status: AC
  Filled 2016-12-13: qty 1

## 2016-12-13 MED ORDER — MIDAZOLAM HCL 2 MG/2ML IJ SOLN
INTRAMUSCULAR | Status: AC
  Administered 2016-12-13: 2 mg
  Filled 2016-12-13: qty 2

## 2016-12-13 MED ORDER — SENNOSIDES-DOCUSATE SODIUM 8.6-50 MG PO TABS: 1.0000 | ORAL_TABLET | Freq: Every evening | ORAL | Status: DC | PRN

## 2016-12-13 MED ORDER — SODIUM CHLORIDE 0.9 % IJ SOLN
INTRAMUSCULAR | Status: DC | PRN
  Administered 2016-12-13: 50 mL via INTRAVENOUS

## 2016-12-13 MED ORDER — HYDROMORPHONE HCL 2 MG/ML IJ SOLN: 1.0000 mg | INTRAMUSCULAR | Status: DC | PRN

## 2016-12-13 MED ORDER — METHOCARBAMOL 500 MG PO TABS
500.0000 mg | ORAL_TABLET | Freq: Four times a day (QID) | ORAL | Status: DC | PRN
  Administered 2016-12-13 – 2016-12-15 (×4): 500 mg via ORAL
  Filled 2016-12-13 (×4): qty 1

## 2016-12-13 MED ORDER — ROSUVASTATIN CALCIUM 5 MG PO TABS
5.0000 mg | ORAL_TABLET | Freq: Every evening | ORAL | Status: DC
  Administered 2016-12-14: 5 mg via ORAL
  Filled 2016-12-13: qty 1

## 2016-12-13 MED ORDER — ASPIRIN 325 MG PO TABS: 325.0000 mg | ORAL_TABLET | Freq: Two times a day (BID) | ORAL | 0 refills | Status: DC

## 2016-12-13 MED ORDER — FLUTICASONE PROPIONATE 50 MCG/ACT NA SUSP
1.0000 | Freq: Every day | NASAL | Status: DC | PRN
  Filled 2016-12-13: qty 16

## 2016-12-13 MED ORDER — GLYCOPYRROLATE 0.2 MG/ML IJ SOLN
INTRAMUSCULAR | Status: DC | PRN
  Administered 2016-12-13: 0.2 mg via INTRAVENOUS

## 2016-12-13 MED ORDER — PHENOL 1.4 % MT LIQD: 1.0000 | OROMUCOSAL | Status: DC | PRN

## 2016-12-13 MED ORDER — OXYCODONE-ACETAMINOPHEN 5-325 MG PO TABS
1.0000 | ORAL_TABLET | ORAL | 0 refills | Status: DC | PRN
Start: 2016-12-13 — End: 2020-04-24

## 2016-12-13 MED ORDER — CEFUROXIME SODIUM 1.5 G IJ SOLR
INTRAMUSCULAR | Status: AC
  Filled 2016-12-13: qty 1.5

## 2016-12-13 MED ORDER — FENTANYL CITRATE (PF) 100 MCG/2ML IJ SOLN
INTRAMUSCULAR | Status: AC
  Filled 2016-12-13: qty 2

## 2016-12-13 MED ORDER — CHLORHEXIDINE GLUCONATE 4 % EX LIQD: 60.0000 mL | Freq: Once | CUTANEOUS | Status: DC

## 2016-12-13 MED ORDER — EPHEDRINE SULFATE 50 MG/ML IJ SOLN
INTRAMUSCULAR | Status: DC | PRN
  Administered 2016-12-13: 10 mg via INTRAVENOUS

## 2016-12-13 MED ORDER — HYDROMORPHONE HCL 1 MG/ML IJ SOLN: 0.2500 mg | INTRAMUSCULAR | Status: DC | PRN

## 2016-12-13 MED ORDER — OXYCODONE HCL 5 MG PO TABS
5.0000 mg | ORAL_TABLET | ORAL | Status: DC | PRN
  Administered 2016-12-13: 10 mg via ORAL
  Administered 2016-12-13: 5 mg via ORAL
  Administered 2016-12-13 – 2016-12-15 (×10): 10 mg via ORAL
  Filled 2016-12-13 (×11): qty 2

## 2016-12-13 MED ORDER — KCL IN DEXTROSE-NACL 20-5-0.45 MEQ/L-%-% IV SOLN: INTRAVENOUS | Status: DC

## 2016-12-13 MED ORDER — DOCUSATE SODIUM 100 MG PO CAPS
100.0000 mg | ORAL_CAPSULE | Freq: Two times a day (BID) | ORAL | Status: DC
  Administered 2016-12-13 – 2016-12-15 (×4): 100 mg via ORAL
  Filled 2016-12-13 (×4): qty 1

## 2016-12-13 MED ORDER — GABAPENTIN 300 MG PO CAPS
300.0000 mg | ORAL_CAPSULE | Freq: Three times a day (TID) | ORAL | Status: DC
  Administered 2016-12-13 – 2016-12-15 (×5): 300 mg via ORAL
  Filled 2016-12-13 (×5): qty 1

## 2016-12-13 MED ORDER — ALUM & MAG HYDROXIDE-SIMETH 200-200-20 MG/5ML PO SUSP: 30.0000 mL | ORAL | Status: DC | PRN

## 2016-12-13 MED ORDER — METOCLOPRAMIDE HCL 5 MG PO TABS: 5.0000 mg | ORAL_TABLET | Freq: Three times a day (TID) | ORAL | Status: DC | PRN

## 2016-12-13 MED ORDER — FENTANYL CITRATE (PF) 100 MCG/2ML IJ SOLN
INTRAMUSCULAR | Status: DC | PRN
  Administered 2016-12-13: 100 ug via INTRAVENOUS

## 2016-12-13 MED ORDER — ONDANSETRON HCL 4 MG/2ML IJ SOLN: 4.0000 mg | Freq: Four times a day (QID) | INTRAMUSCULAR | Status: DC | PRN

## 2016-12-13 MED ORDER — ALBUTEROL SULFATE (2.5 MG/3ML) 0.083% IN NEBU: 2.5000 mg | INHALATION_SOLUTION | Freq: Four times a day (QID) | RESPIRATORY_TRACT | Status: DC | PRN

## 2016-12-13 MED ORDER — PROPOFOL 500 MG/50ML IV EMUL
INTRAVENOUS | Status: DC | PRN
  Administered 2016-12-13: 50 ug/kg/min via INTRAVENOUS

## 2016-12-13 MED ORDER — PHENYLEPHRINE HCL 10 MG/ML IJ SOLN
INTRAVENOUS | Status: DC | PRN
  Administered 2016-12-13: 25 ug/min via INTRAVENOUS

## 2016-12-13 MED ORDER — BISACODYL 5 MG PO TBEC
5.0000 mg | DELAYED_RELEASE_TABLET | Freq: Every day | ORAL | Status: DC | PRN
Start: 2016-12-13 — End: 2016-12-15

## 2016-12-13 MED ORDER — DEXTROSE-NACL 5-0.45 % IV SOLN: INTRAVENOUS | Status: DC

## 2016-12-13 MED ORDER — METHYLPREDNISOLONE ACETATE 80 MG/ML IJ SUSP
INTRAMUSCULAR | Status: AC
  Filled 2016-12-13: qty 1

## 2016-12-13 MED ORDER — FENTANYL CITRATE (PF) 100 MCG/2ML IJ SOLN
INTRAMUSCULAR | Status: AC
  Administered 2016-12-13: 50 ug
  Filled 2016-12-13: qty 2

## 2016-12-13 MED ORDER — ASPIRIN EC 325 MG PO TBEC
325.0000 mg | DELAYED_RELEASE_TABLET | Freq: Every day | ORAL | Status: DC
  Administered 2016-12-14 – 2016-12-15 (×2): 325 mg via ORAL
  Filled 2016-12-13 (×2): qty 1

## 2016-12-13 MED ORDER — ONDANSETRON HCL 4 MG/2ML IJ SOLN
INTRAMUSCULAR | Status: DC | PRN
  Administered 2016-12-13: 4 mg via INTRAVENOUS

## 2016-12-13 MED ORDER — MENTHOL 3 MG MT LOZG: 1.0000 | LOZENGE | OROMUCOSAL | Status: DC | PRN

## 2016-12-13 MED ORDER — SODIUM CHLORIDE 0.9 % IV SOLN
INTRAVENOUS | Status: DC | PRN
  Administered 2016-12-13: 2000 mg via INTRAVENOUS

## 2016-12-13 MED ORDER — LIDOCAINE 2% (20 MG/ML) 5 ML SYRINGE
INTRAMUSCULAR | Status: DC | PRN
  Administered 2016-12-13: 70 mg via INTRAVENOUS

## 2016-12-13 MED ORDER — DIPHENHYDRAMINE HCL 12.5 MG/5ML PO ELIX: 12.5000 mg | ORAL_SOLUTION | ORAL | Status: DC | PRN

## 2016-12-13 MED ORDER — LACTATED RINGERS IV SOLN
INTRAVENOUS | Status: DC
  Administered 2016-12-13: 50 mL/h via INTRAVENOUS
  Administered 2016-12-13 (×2): via INTRAVENOUS

## 2016-12-13 MED ORDER — FLEET ENEMA 7-19 GM/118ML RE ENEM: 1.0000 | ENEMA | Freq: Once | RECTAL | Status: DC | PRN

## 2016-12-13 MED ORDER — METOPROLOL TARTRATE 25 MG PO TABS
25.0000 mg | ORAL_TABLET | Freq: Two times a day (BID) | ORAL | Status: DC
  Administered 2016-12-13 – 2016-12-15 (×4): 25 mg via ORAL
  Filled 2016-12-13 (×4): qty 1

## 2016-12-13 MED ORDER — ONDANSETRON HCL 4 MG PO TABS: 4.0000 mg | ORAL_TABLET | Freq: Four times a day (QID) | ORAL | Status: DC | PRN

## 2016-12-13 SURGICAL SUPPLY — 52 items
BANDAGE ELASTIC 6 VELCRO ST LF (GAUZE/BANDAGES/DRESSINGS) ×1 IMPLANT
BANDAGE ESMARK 6X9 LF (GAUZE/BANDAGES/DRESSINGS) ×1 IMPLANT
BLADE SAG 18X100X1.27 (BLADE) ×2 IMPLANT
BLADE SAW SGTL 13X75X1.27 (BLADE) ×2 IMPLANT
BNDG CMPR 9X6 STRL LF SNTH (GAUZE/BANDAGES/DRESSINGS) ×1
BNDG CMPR MED 10X6 ELC LF (GAUZE/BANDAGES/DRESSINGS) ×1
BNDG ELASTIC 6X10 VLCR STRL LF (GAUZE/BANDAGES/DRESSINGS) ×2 IMPLANT
BNDG ESMARK 6X9 LF (GAUZE/BANDAGES/DRESSINGS) ×2
BOWL SMART MIX CTS (DISPOSABLE) ×2 IMPLANT
CAPT KNEE TOTAL 3 ATTUNE ×1 IMPLANT
CEMENT HV SMART SET (Cement) ×4 IMPLANT
COVER SURGICAL LIGHT HANDLE (MISCELLANEOUS) ×2 IMPLANT
CUFF TOURNIQUET SINGLE 34IN LL (TOURNIQUET CUFF) ×2 IMPLANT
CUFF TOURNIQUET SINGLE 44IN (TOURNIQUET CUFF) IMPLANT
DRAPE EXTREMITY T 121X128X90 (DRAPE) ×2 IMPLANT
DRAPE U-SHAPE 47X51 STRL (DRAPES) ×2 IMPLANT
DRSG AQUACEL AG ADV 3.5X10 (GAUZE/BANDAGES/DRESSINGS) ×2 IMPLANT
DURAPREP 26ML APPLICATOR (WOUND CARE) ×2 IMPLANT
ELECT REM PT RETURN 9FT ADLT (ELECTROSURGICAL) ×2
ELECTRODE REM PT RTRN 9FT ADLT (ELECTROSURGICAL) ×1 IMPLANT
GLOVE BIO SURGEON STRL SZ7.5 (GLOVE) ×2 IMPLANT
GLOVE BIO SURGEON STRL SZ8.5 (GLOVE) ×2 IMPLANT
GLOVE BIOGEL PI IND STRL 8 (GLOVE) ×1 IMPLANT
GLOVE BIOGEL PI IND STRL 9 (GLOVE) ×1 IMPLANT
GLOVE BIOGEL PI INDICATOR 8 (GLOVE) ×1
GLOVE BIOGEL PI INDICATOR 9 (GLOVE) ×1
GOWN STRL REUS W/ TWL LRG LVL3 (GOWN DISPOSABLE) ×1 IMPLANT
GOWN STRL REUS W/ TWL XL LVL3 (GOWN DISPOSABLE) ×2 IMPLANT
GOWN STRL REUS W/TWL LRG LVL3 (GOWN DISPOSABLE) ×2
GOWN STRL REUS W/TWL XL LVL3 (GOWN DISPOSABLE) ×4
HANDPIECE INTERPULSE COAX TIP (DISPOSABLE) ×2
HOOD PEEL AWAY FACE SHEILD DIS (HOOD) ×4 IMPLANT
KIT BASIN OR (CUSTOM PROCEDURE TRAY) ×2 IMPLANT
KIT ROOM TURNOVER OR (KITS) ×2 IMPLANT
MANIFOLD NEPTUNE II (INSTRUMENTS) ×2 IMPLANT
NEEDLE 22X1 1/2 (OR ONLY) (NEEDLE) ×4 IMPLANT
NS IRRIG 1000ML POUR BTL (IV SOLUTION) ×2 IMPLANT
PACK TOTAL JOINT (CUSTOM PROCEDURE TRAY) ×2 IMPLANT
PAD ARMBOARD 7.5X6 YLW CONV (MISCELLANEOUS) ×4 IMPLANT
SET HNDPC FAN SPRY TIP SCT (DISPOSABLE) ×1 IMPLANT
SUT VIC AB 0 CT1 27 (SUTURE) ×2
SUT VIC AB 0 CT1 27XBRD ANBCTR (SUTURE) ×1 IMPLANT
SUT VIC AB 1 CTX 36 (SUTURE) ×2
SUT VIC AB 1 CTX36XBRD ANBCTR (SUTURE) ×1 IMPLANT
SUT VIC AB 2-0 CT1 27 (SUTURE) ×2
SUT VIC AB 2-0 CT1 TAPERPNT 27 (SUTURE) ×1 IMPLANT
SUT VIC AB 3-0 CT1 27 (SUTURE) ×2
SUT VIC AB 3-0 CT1 TAPERPNT 27 (SUTURE) ×1 IMPLANT
SYR CONTROL 10ML LL (SYRINGE) ×4 IMPLANT
TOWEL OR 17X24 6PK STRL BLUE (TOWEL DISPOSABLE) ×2 IMPLANT
TOWEL OR 17X26 10 PK STRL BLUE (TOWEL DISPOSABLE) ×2 IMPLANT
TRAY CATH 16FR W/PLASTIC CATH (SET/KITS/TRAYS/PACK) IMPLANT

## 2016-12-13 NOTE — Interval H&P Note (Signed)
History and Physical Interval Note:  12/13/2016 11:09 AM  Connor Adams  has presented today for surgery, with the diagnosis of BILATERAL KNEE OSTEOARTHRITIS  The various methods of treatment have been discussed with the patient and family. After consideration of risks, benefits and other options for treatment, the patient has consented to  Procedure(s): TOTAL KNEE ARTHROPLASTY WITH RIGHT KNEE CORTISONE INJECTION (Left) as a surgical intervention .  The patient's history has been reviewed, patient examined, no change in status, stable for surgery.  I have reviewed the patient's chart and labs.  Questions were answered to the patient's satisfaction.     [name]

## 2016-12-13 NOTE — Progress Notes (Signed)
Orthopedic Tech Progress Note Patient Details:  Connor Adams 1946/02/08 [id number]  Ortho Devices Type of Ortho Device:  (footsie roll) Ortho Device/Splint Location: lle Ortho Device/Splint Interventions: Application   Darling Cieslewicz 12/13/2016, 1:39 PM

## 2016-12-13 NOTE — Anesthesia Procedure Notes (Signed)
Spinal  Patient location during procedure: OR Staffing Anesthesiologist: Lyndle Herrlich Preanesthetic Checklist Completed: patient identified, site marked, surgical consent, pre-op evaluation, timeout performed, IV checked, risks and benefits discussed and monitors and equipment checked Spinal Block Patient position: sitting Prep: DuraPrep Patient monitoring: heart rate, cardiac monitor, continuous pulse ox and blood pressure Approach: midline Location: L3-4 Injection technique: single-shot Needle Needle type: Sprotte  Needle gauge: 24 G Needle length: 9 cm Assessment Sensory level: T4 Additional Notes Spinal Dosage in OR  Bupivicaine ml       1.9 LLD x 3 min

## 2016-12-13 NOTE — Op Note (Signed)
PATIENT ID:      Connor Adams  MRN:     [id number] DOB/AGE:    1945-12-09 / 71 y.o.       OPERATIVE REPORT    DATE OF PROCEDURE:  12/13/2016       PREOPERATIVE DIAGNOSIS:   BILATERAL KNEE OSTEOARTHRITIS      Estimated body mass index is 28.17 kg/m as calculated from the following:   Height as of 12/03/16: 5\' 11"  (1.803 m).   Weight as of this encounter: 91.6 kg (202 lb).                                                        POSTOPERATIVE DIAGNOSIS:   BILATERAL KNEE OSTEOARTHRITIS                                                                      PROCEDURE:  Procedure(s): TOTAL KNEE ARTHROPLASTY WITH RIGHT KNEE CORTISONE INJECTION Using DepuyAttune RP implants #7L Femur, #8Tibia, 6 mm Attune RP bearing, 41 Patella     SURGEON: Dlynn Ranes J    ASSISTANT:   [employer]   (Present and scrubbed throughout the case, critical for assistance with exposure, retraction, instrumentation, and closure.)         ANESTHESIA: Spinal, 20cc Exparel, 50cc 0.25% Marcaine  EBL: 300  FLUID REPLACEMENT: 1600 crystalloid  TOURNIQUET TIME: 62min  Drains: None  Tranexamic Acid: 1gm IV, 2gm topical  COMPLICATIONS:  None         INDICATIONS FOR PROCEDURE: The patient has  BILATERAL KNEE OSTEOARTHRITIS, Var deformities, XR shows bone on bone arthritis, lateral subluxation of tibia. Patient has failed all conservative measures including anti-inflammatory medicines, narcotics, attempts at  exercise and weight loss, cortisone injections and viscosupplementation.  Risks and benefits of surgery have been discussed, questions answered.   DESCRIPTION OF PROCEDURE: The patient identified by armband, received  IV antibiotics, in the holding area at [facility]. Patient taken to the operating room, appropriate anesthetic  monitors were attached, and Spinal anesthesia was  induced. Tourniquet  applied high to the operative thigh. Lateral post and foot positioner  applied to the table, the lower  extremity was then prepped and draped  in usual sterile fashion from the toes to the tourniquet. Time-out procedure was performed. We began the operation, with the knee flexed 120 degrees, by making the anterior midline incision starting at handbreadth above the patella going over the patella 1 cm medial to and 4 cm distal to the tibial tubercle. Small bleeders in the skin and the  subcutaneous tissue identified and cauterized. Transverse retinaculum was incised and reflected medially and a medial parapatellar arthrotomy was accomplished. the patella was everted and theprepatellar fat pad resected. The superficial medial collateral  ligament was then elevated from anterior to posterior along the proximal  flare of the tibia and anterior half of the menisci resected. The knee was hyperflexed exposing bone on bone arthritis. Peripheral and notch osteophytes as well as the cruciate ligaments were then resected. We continued to  work our way around posteriorly  along the proximal tibia, and externally  rotated the tibia subluxing it out from underneath the femur. A McHale  retractor was placed through the notch and a lateral Hohmann retractor  placed, and we then drilled through the proximal tibia in line with the  axis of the tibia followed by an intramedullary guide rod and 2-degree  posterior slope cutting guide. The tibial cutting guide, 3 degree posterior sloped, was pinned into place allowing resection of 3 mm of bone medially and 12 mm of bone laterally. Satisfied with the tibial resection, we then  entered the distal femur 2 mm anterior to the PCL origin with the  intramedullary guide rod and applied the distal femoral cutting guide  set at 9 mm, with 5 degrees of valgus. This was pinned along the  epicondylar axis. At this point, the distal femoral cut was accomplished without difficulty. We then sized for a #7L femoral component and pinned the guide in 3 degrees of external rotation. The chamfer  cutting guide was pinned into place. The anterior, posterior, and chamfer cuts were accomplished without difficulty followed by  the Attune RP box cutting guide and the box cut. We also removed posterior osteophytes from the posterior femoral condyles. At this  time, the knee was brought into full extension. We checked our  extension and flexion gaps and found them symmetric for a 6 mm bearing. Distracting in extension with a lamina spreader, the posterior horns of the menisci were removed, and Exparel, diluted to 60 cc, with 20cc NS, and 20cc 0.5% Marcaine,was injected into the capsule and synovium of the knee. The posterior patella cut was accomplished with the 9.5 mm Attune cutting guide, sized for a 25mm dome, and the fixation pegs drilled.The knee  was then once again hyperflexed exposing the proximal tibia. We sized for a # 8 tibial base plate, applied the smokestack and the conical reamer followed by the the Delta fin keel punch. We then hammered into place the Attune RP trial femoral component, drilled the lugs, inserted a  6 mm trial bearing, trial patellar button, and took the knee through range of motion from 0-130 degrees. No thumb pressure was required for patellar Tracking. At this point, the limb was wrapped with an Esmarch bandage and the tourniquet inflated to 350 mmHg. All trial components were removed, mating surfaces irrigated with pulse lavage, and dried with suction and sponges. 10 cc of the Exparel solution was applied to the cancellus bone of the patella distal femur and proximal tibia.  After waiting 1 minute, the bony surfaces were again, dried with sponges. A double batch of DePuy HV cement with 1500 mg of Zinacef was mixed and applied to all bony metallic mating surfaces except for the posterior condyles of the femur itself. In order, we hammered into place the tibial tray and removed excess cement, the femoral component and removed excess cement. The final Attune RP bearing  was  inserted, and the knee brought to full extension with compression.  The patellar button was clamped into place, and excess cement  removed. While the cement cured the wound was irrigated out with normal saline solution pulse lavage. Ligament stability and patellar tracking were checked and found to be excellent. The parapatellar arthrotomy was closed with  running #1 Vicryl suture. The subcutaneous tissue with 0 and 2-0 undyed  Vicryl suture, and the skin with running 3-0 SQ vicryl. A dressing of Xeroform,  4 x 4, dressing sponges, Webril, and Ace wrap applied. The patient  awakened, and taken to recovery room without difficulty.   [name] 12/13/2016, 12:42 PM

## 2016-12-13 NOTE — Transfer of Care (Signed)
Immediate Anesthesia Transfer of Care Note  Patient: Connor Adams  Procedure(s) Performed: Procedure(s): TOTAL KNEE ARTHROPLASTY WITH RIGHT KNEE CORTISONE INJECTION (Left)  Patient Location: PACU  Anesthesia Type:Spinal  Level of Consciousness: awake, alert  and oriented  Airway & Oxygen Therapy: Patient Spontanous Breathing and Patient connected to nasal cannula oxygen  Post-op Assessment: Report given to RN and Post -op Vital signs reviewed and stable  Post vital signs: Reviewed and stable  Last Vitals:  Vitals:   12/13/16 1100 12/13/16 1325  BP: (!) 173/85   Pulse:    Resp:    Temp:  36.4 C    Last Pain:  Vitals:   12/13/16 1325  TempSrc:   PainSc: 0-No pain      Patients Stated Pain Goal: 5 (32/95/18 8416)  Complications: No apparent anesthesia complications

## 2016-12-13 NOTE — Anesthesia Procedure Notes (Signed)
Procedure Name: MAC Date/Time: 12/13/2016 11:29 AM Performed by: Babs Bertin Pre-anesthesia Checklist: Patient identified, Emergency Drugs available, Suction available, Patient being monitored and Timeout performed Patient Re-evaluated:Patient Re-evaluated prior to inductionOxygen Delivery Method: Nasal cannula

## 2016-12-13 NOTE — Discharge Instructions (Signed)

## 2016-12-13 NOTE — Anesthesia Preprocedure Evaluation (Addendum)
Anesthesia Evaluation  Patient identified by MRN, date of birth, ID band Patient awake    Reviewed: Allergy & Precautions, H&P , Patient's Chart, lab work & pertinent test results, reviewed documented beta blocker date and time   Airway Mallampati: II  TM Distance: >3 FB Neck ROM: full    Dental no notable dental hx.    Pulmonary    Pulmonary exam normal breath sounds clear to auscultation       Cardiovascular hypertension,  Rhythm:regular Rate:Normal     Neuro/Psych TIA   GI/Hepatic GERD  ,  Endo/Other    Renal/GU      Musculoskeletal   Abdominal   Peds  Hematology   Anesthesia Other Findings No residual No anticoags Feels well  Reproductive/Obstetrics                            Anesthesia Physical Anesthesia Plan  ASA: III  Anesthesia Plan: Spinal   Post-op Pain Management:    Induction:   Airway Management Planned:   Additional Equipment:   Intra-op Plan:   Post-operative Plan:   Informed Consent: I have reviewed the patients History and Physical, chart, labs and discussed the procedure including the risks, benefits and alternatives for the proposed anesthesia with the patient or authorized representative who has indicated his/her understanding and acceptance.   Dental Advisory Given  Plan Discussed with: CRNA and Surgeon  Anesthesia Plan Comments: (  )        Anesthesia Quick Evaluation

## 2016-12-14 ENCOUNTER — Encounter (HOSPITAL_COMMUNITY): Payer: Self-pay | Admitting: Orthopedic Surgery

## 2016-12-14 LAB — CBC
HCT: 38.3 % — ABNORMAL LOW (ref 39.0–52.0)
Hemoglobin: 12.5 g/dL — ABNORMAL LOW (ref 13.0–17.0)
MCH: 31.5 pg (ref 26.0–34.0)
MCHC: 32.6 g/dL (ref 30.0–36.0)
MCV: 96.5 fL (ref 78.0–100.0)
Platelets: 154 10*3/uL (ref 150–400)
RBC: 3.97 MIL/uL — ABNORMAL LOW (ref 4.22–5.81)
RDW: 13 % (ref 11.5–15.5)
WBC: 9.3 10*3/uL (ref 4.0–10.5)

## 2016-12-14 LAB — BASIC METABOLIC PANEL
Anion gap: 9 (ref 5–15)
BUN: 15 mg/dL (ref 6–20)
CO2: 27 mmol/L (ref 22–32)
Calcium: 8.6 mg/dL — ABNORMAL LOW (ref 8.9–10.3)
Chloride: 100 mmol/L — ABNORMAL LOW (ref 101–111)
Creatinine, Ser: 1.31 mg/dL — ABNORMAL HIGH (ref 0.61–1.24)
GFR calc Af Amer: >60 mL/min (ref >60)
GFR calc non Af Amer: 54 mL/min — ABNORMAL LOW (ref >60)
Glucose, Bld: 138 mg/dL — ABNORMAL HIGH (ref 65–99)
Potassium: 4 mmol/L (ref 3.5–5.1)
Sodium: 136 mmol/L (ref 135–145)

## 2016-12-14 NOTE — Progress Notes (Signed)
PATIENT ID: Connor Adams  MRN: [id number]  DOB/AGE:  71-16-1947 / 71 y.o.  1 Day Post-Op Procedure(s) (LRB): TOTAL KNEE ARTHROPLASTY WITH RIGHT KNEE CORTISONE INJECTION (Left)    PROGRESS NOTE Subjective: Patient is alert, oriented, no Nausea, no Vomiting, yes passing gas. Taking PO well. Denies SOB, Chest or Calf Pain. Using Incentive Spirometer, PAS in place. Ambulate WBAT, Patient reports pain as 2/10, no PT as yet, in PACU till evening .    Objective: Vital signs in last 24 hours: Vitals:   12/13/16 1725 12/13/16 2026 12/14/16 0110 12/14/16 0511  BP:  (!) 162/75 (!) 155/70 (!) 144/63  Pulse:  71 75 (!) 57  Resp:  10 10 10   Temp: 98 F (36.7 C) 99 F (37.2 C) 98.7 F (37.1 C) 100.2 F (37.9 C)  TempSrc:  Oral Oral Oral  SpO2:  99% 99% 95%  Weight:          Intake/Output from previous day: I/O last 3 completed shifts: In: 1180 [P.O.:180; I.V.:1000] Out: 1100 [Urine:800; Blood:300]   Intake/Output this shift: No intake/output data recorded.   LABORATORY DATA:  Recent Labs  12/14/16 0509  WBC 9.3  HGB 12.5*  HCT 38.3*  PLT 154  NA 136  K 4.0  CL 100*  CO2 27  BUN 15  CREATININE 1.31*  GLUCOSE 138*  CALCIUM 8.6*    Examination: Neurologically intact ABD soft Neurovascular intact Sensation intact distally Intact pulses distally Dorsiflexion/Plantar flexion intact Incision: dressing C/D/I No cellulitis present Compartment soft}  Assessment:   1 Day Post-Op Procedure(s) (LRB): TOTAL KNEE ARTHROPLASTY WITH RIGHT KNEE CORTISONE INJECTION (Left) ADDITIONAL DIAGNOSIS: Expected Acute Blood Loss Anemia, Hypertension  Plan: PT/OT WBAT, AROM and PROM  DVT Prophylaxis:  SCDx72hrs, ASA 325 mg BID x 2 weeks DISCHARGE PLAN: Home, prob tomorrow DISCHARGE NEEDS: HHPT, Walker and 3-in-1 comode seat     Connor Adams 12/14/2016, 7:13 AM Patient ID: Connor Adams, male   DOB: 06-30-1946, 71 y.o.   MRN: [id number]

## 2016-12-14 NOTE — Progress Notes (Signed)
qPhysical Therapy Treatment Patient Details Name: Connor Adams MRN: [id number] DOB: [birth year] Today's Date: 12/14/2016    History of Present Illness 71 y.o. male, has a history of pain and functional disability in the left knee due to arthritis x 4 years, post Adams TKA on 12/13/16.    PT Comments    Patient seen after pain medication delivered by nursing.  Still requiring MIN assist for bed mobility, but Guard to Supervision otherwise.  Treatment emphasis on therapeutic exercise instruction and performance, but also included stairs training.   Patient progressing well, demonstrates good ROM and mobility, will continue to benefit from skilled PT services.   Follow Up Recommendations  Supervision - Intermittent;Outpatient PT     Equipment Recommendations  Rolling walker with 5" wheels;3in1 (PT)    Recommendations for Other Services       Precautions / Restrictions Precautions Required Braces or Orthoses: Other Brace/Splint Other Brace/Splint: Zero knee Restrictions Weight Bearing Restrictions: Yes LLE Weight Bearing: Weight bearing as tolerated    Mobility  Bed Mobility Overal bed mobility: Needs Assistance Bed Mobility: Supine to Sit;Sit to Supine     Supine to sit: Min assist Sit to supine: Min assist   General bed mobility comments: For Left leg management  Transfers Overall transfer level: Needs assistance Equipment used: Rolling walker (2 wheeled) Transfers: Sit to/from [facility] Sit to Stand: Supervision Stand pivot transfers: Supervision       General transfer comment: Cues for hand placement and use of RW  Ambulation/Gait Ambulation/Gait assistance: Supervision Ambulation Distance (Feet): 200 Feet Assistive device: Rolling walker (2 wheeled) Gait Pattern/deviations: Step-to pattern;Step-through pattern;Antalgic         Stairs Stairs: Yes   Stair Management: Step to pattern;One rail Right   General stair comments: Cues for  sequencing  Wheelchair Mobility    Modified Rankin (Stroke Patients Only)       Balance Overall balance assessment: Needs assistance   Sitting balance-Leahy Scale: Good       Standing balance-Leahy Scale: Fair Standing balance comment: Can stand without support, uses support for dynamic mobility                            Cognition Arousal/Alertness: Awake/alert Behavior During Therapy: WFL for tasks assessed/performed Overall Cognitive Status: Within Functional Limits for tasks assessed                                        Exercises Total Joint Exercises Ankle Circles/Pumps: AROM;Both;10 reps;Supine Quad Sets: AROM;Left;10 reps;Supine Short Arc Quad: AAROM;Left;5 reps;Supine Heel Slides: AAROM;Left;5 reps;Supine Hip ABduction/ADduction: AAROM;Left;5 reps;Supine Straight Leg Raises: AAROM;Left;5 reps;Supine Long Arc Quad: AAROM;Left;10 reps;Seated Knee Flexion: AAROM;Left;10 reps;Seated Goniometric ROM: 5-90    General Comments        Pertinent Vitals/Pain Pain Assessment: 0-10 Pain Score: 5  Pain Location: Left knee Pain Descriptors / Indicators: Aching;Sore    Home Living                      Prior Function            PT Goals (current goals can now be found in the care plan section) Acute Rehab PT Goals Patient Stated Goal: To get better PT Goal Formulation: With patient/family Time For Goal Achievement: 12/28/16 Potential to Achieve Goals: Good Progress towards PT goals: Progressing toward  goals    Frequency    BID      PT Plan Current plan remains appropriate    Co-evaluation             End of Session Equipment Utilized During Treatment: Gait belt Activity Tolerance: Patient tolerated treatment well Patient left: in bed;with call bell/phone within reach;with SCD's reapplied Nurse Communication: Mobility status PT Visit Diagnosis: Unsteadiness on feet (R26.81);Pain Pain - Right/Left:  Left Pain - part of body: Knee     Time: 1545-1620 PT Time Calculation (min) (ACUTE ONLY): 35 min  Charges:  $Gait Training: 8-22 mins $Therapeutic Exercise: 8-22 mins                    G Codes:       Connor Adams, DPT   Connor Adams, Connor Adams 12/14/2016, 4:26 PM

## 2016-12-14 NOTE — Anesthesia Postprocedure Evaluation (Signed)
Anesthesia Post Note  Patient: Connor Adams  Procedure(s) Performed: Procedure(s) (LRB): TOTAL KNEE ARTHROPLASTY WITH RIGHT KNEE CORTISONE INJECTION (Left)  Patient location during evaluation: PACU Anesthesia Type: Spinal Level of consciousness: oriented and awake and alert Pain management: pain level controlled Vital Signs Assessment: post-procedure vital signs reviewed and stable Respiratory status: spontaneous breathing, respiratory function stable and patient connected to nasal cannula oxygen Cardiovascular status: blood pressure returned to baseline and stable Postop Assessment: no headache, no backache and spinal receding Anesthetic complications: no        Last Vitals:  Vitals:   12/14/16 0511 12/14/16 1402  BP: (!) 144/63 139/69  Pulse: (!) 57 62  Resp: 10   Temp: 37.9 C     Last Pain:  Vitals:   12/14/16 1619  TempSrc:   PainSc: 2    Pain Goal: Patients Stated Pain Goal: 0 (12/14/16 1402)               Catalina Gravel

## 2016-12-14 NOTE — Progress Notes (Signed)
Physical Therapy Evaluation Patient Details Name: Connor Adams MRN: [id number] DOB: 01/15/1946 Today's Date: 12/14/2016   History of Present Illness  71 y.o. 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  Clinical Impression  Patient seen with spouse present,  Provided exercise handout and reviewed Zero knee bolster.  Patient is overall limited by pain and decreased ROM, needs MIN support for functional mobility and transfers, including gait.  Trained spouse on appropriate guarding of patient and encouraged mobility as tolerated.  Plan to return home with outpatient therapy services.  Patient is appropriate for continued PT services acutely.     Follow Up Recommendations Supervision - Intermittent;Outpatient PT    Equipment Recommendations  Rolling walker with 5" wheels;3in1 (PT)    Recommendations for Other Services       Precautions / Restrictions Precautions Required Braces or Orthoses: Other Brace/Splint Other Brace/Splint: Zero knee Restrictions Weight Bearing Restrictions: Yes LLE Weight Bearing: Weight bearing as tolerated      Mobility  Bed Mobility Overal bed mobility: Needs Assistance Bed Mobility: Supine to Sit     Supine to sit: Min assist     General bed mobility comments: For Left leg management  Transfers Overall transfer level: Needs assistance Equipment used: Rolling walker (2 wheeled) Transfers: Sit to/from [facility] Sit to Stand: Min guard Stand pivot transfers: Min guard       General transfer comment: Cues for hand placement and use of RW  Ambulation/Gait Ambulation/Gait assistance: Min guard;Supervision Ambulation Distance (Feet): 150 Feet Assistive device: Rolling walker (2 wheeled) Gait Pattern/deviations: Step-to pattern;Step-through pattern;Antalgic        Stairs Stairs: Yes Stairs assistance: Min guard Stair Management: Two  rails;Step to pattern Number of Stairs: 2 General stair comments: Cues for sequencing  Wheelchair Mobility    Modified Rankin (Stroke Patients Only)       Balance Overall balance assessment: Needs assistance   Sitting balance-Leahy Scale: Good       Standing balance-Leahy Scale: Fair Standing balance comment: Can stand without support, uses support for dynamic mobility                             Pertinent Vitals/Pain Pain Assessment: 0-10 Pain Score: 6  Pain Location: Left knee Pain Descriptors / Indicators: Aching;Sore Pain Intervention(s): Monitored during session;Premedicated before session    [place] expects to be discharged to:: Private residence Living Arrangements: Spouse/significant other Available Help at Discharge: Family Type of Home: House Home Access: Stairs to enter   [employer] of Steps: 1 Home Layout: Two level;Able to live on main level with bedroom/bathroom        Prior Function Level of Independence: Independent               Hand Dominance        Extremity/Trunk Assessment   Upper Extremity Assessment Upper Extremity Assessment: Overall WFL for tasks assessed    Lower Extremity Assessment Lower Extremity Assessment: Overall WFL for tasks assessed;Generalized weakness (Weak due to pain and acute surgery Left Knee)    Cervical / Trunk Assessment Cervical / Trunk Assessment: Normal  Communication   Communication: No difficulties  Cognition Arousal/Alertness: Awake/alert Behavior During Therapy: WFL for tasks assessed/performed Overall Cognitive Status: Within Functional Limits for tasks assessed  General Comments General comments (skin integrity, edema, etc.): Limited by pain in Left knee    Exercises Total Joint Exercises Long Arc Quad: AAROM;Left;10 reps;Seated Knee Flexion: AAROM;Left;10 reps;Seated   Assessment/Plan     PT Assessment Patient needs continued PT services  PT Problem List Decreased strength;Decreased range of motion;Decreased activity tolerance;Decreased mobility;Decreased knowledge of use of DME;Pain       PT Treatment Interventions DME instruction;Gait training;Stair training;Functional mobility training;Therapeutic activities;Therapeutic exercise;Manual techniques;Patient/family education    PT Goals (Current goals can be found in the Care Plan section)  Acute Rehab PT Goals Patient Stated Goal: To get better PT Goal Formulation: With patient/family Time For Goal Achievement: 12/28/16 Potential to Achieve Goals: Good    Frequency BID   Barriers to discharge        Co-evaluation               End of Session Equipment Utilized During Treatment: Gait belt Activity Tolerance: Patient tolerated treatment well Patient left: in chair;with family/visitor present Nurse Communication: Mobility status PT Visit Diagnosis: Unsteadiness on feet (R26.81);Pain Pain - Right/Left: Left Pain - part of body: Knee    Time: 0920-1000 PT Time Calculation (min) (ACUTE ONLY): 40 min   Charges:   PT Evaluation $PT Eval Low Complexity: 1 Procedure PT Treatments $Gait Training: 8-22 mins $Therapeutic Activity: 8-22 mins   PT G Codes:        Judith Blonder, DPT  Zenia Resides, Shannon Balthazar L 12/14/2016, 10:16 AM

## 2016-12-14 NOTE — Care Management Note (Signed)
Case Management Note  Patient Details  Name: Connor Adams MRN: [id number] Date of Birth: 03-01-1946  Subjective/Objective:   69 yr young gentleman s/p left total knee arthroplasty.                 Action/Plan: Case manager spoke with patient and wife concerning [place] and DME needs. Patient was preoperatively setup with [facility], no changes. He will have family support at discharge.    Expected Discharge Date:   12/14/16               Expected Discharge Plan:  [place]  In-House Referral:  NA  Discharge planning Services  CM Consult  Post Acute Care Choice:  Durable Medical Equipment, Home Health Choice offered to:  Patient  DME Arranged:  3-N-1, Walker rolling DME Agency:  [employer]  HH Arranged:  PT [place]:  [facility] [employer]  Status of Service:  Completed, signed off  If discussed at [employer], dates discussed:    Additional Comments:  Ninfa Meeker, RN 12/14/2016, 2:06 PM

## 2016-12-15 LAB — CBC
HCT: 37 % — ABNORMAL LOW (ref 39.0–52.0)
Hemoglobin: 12.1 g/dL — ABNORMAL LOW (ref 13.0–17.0)
MCH: 31.7 pg (ref 26.0–34.0)
MCHC: 32.7 g/dL (ref 30.0–36.0)
MCV: 96.9 fL (ref 78.0–100.0)
Platelets: 136 10*3/uL — ABNORMAL LOW (ref 150–400)
RBC: 3.82 MIL/uL — ABNORMAL LOW (ref 4.22–5.81)
RDW: 13.4 % (ref 11.5–15.5)
WBC: 9.6 10*3/uL (ref 4.0–10.5)

## 2016-12-15 NOTE — Progress Notes (Signed)
PATIENT ID: Connor Adams  MRN: [id number]  DOB/AGE:  [birth year] / 71 y.o.  2 Days Post-Op Procedure(s) (LRB): TOTAL KNEE ARTHROPLASTY WITH RIGHT KNEE CORTISONE INJECTION (Left)    PROGRESS NOTE Subjective: Patient is alert, oriented, no Nausea, no Vomiting, yes passing gas. Taking PO well. Denies SOB, Chest or Calf Pain. Using Incentive Spirometer, PAS in place. Ambulate WBAT with pt walking 200 ft with therapy, Patient reports pain as 3/10 .    Objective: Vital signs in last 24 hours: Vitals:   12/14/16 0511 12/14/16 1402 12/14/16 1900 12/15/16 0427  BP: (!) 144/63 139/69 130/81 (!) 158/81  Pulse: (!) 57 62 76 61  Resp: 10 14 16 16   Temp: 100.2 F (37.9 C) 98.7 F (37.1 C) 98.3 F (36.8 C) 98.8 F (37.1 C)  TempSrc: Oral Oral Oral Oral  SpO2: 95% 100% 96% 97%  Weight:          Intake/Output from previous day: I/O last 3 completed shifts: In: 960 [P.O.:960] Out: 1700 [Urine:1700]   Intake/Output this shift: No intake/output data recorded.   LABORATORY DATA:  Recent Labs  12/14/16 0509 12/15/16 0309  WBC 9.3 9.6  HGB 12.5* 12.1*  HCT 38.3* 37.0*  PLT 154 136*  NA 136  --   K 4.0  --   CL 100*  --   CO2 27  --   BUN 15  --   CREATININE 1.31*  --   GLUCOSE 138*  --   CALCIUM 8.6*  --     Examination: Neurologically intact Neurovascular intact Sensation intact distally Intact pulses distally Dorsiflexion/Plantar flexion intact Incision: dressing C/D/I and no drainage No cellulitis present Compartment soft}  Assessment:   2 Days Post-Op Procedure(s) (LRB): TOTAL KNEE ARTHROPLASTY WITH RIGHT KNEE CORTISONE INJECTION (Left) ADDITIONAL DIAGNOSIS: Expected Acute Blood Loss Anemia, Hypertension  Plan: PT/OT WBAT, AROM and PROM  DVT Prophylaxis:  SCDx72hrs, ASA 325 mg BID x 2 weeks DISCHARGE PLAN: Home DISCHARGE NEEDS: HHPT, Walker and 3-in-1 comode seat     PHILLIPS, ERIC R 12/15/2016, 7:46 AM

## 2016-12-15 NOTE — Progress Notes (Signed)
Occupational Therapy Evaluation Patient Details Name: Connor Adams MRN: [id number] DOB: 10/08/1945 Today's Date: 12/15/2016    History of Present Illness 71 y.o. 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   Clinical Impression   Making excellent progress. Completed all education regarding compensatory techniques for ADL, reducing risk of falls and safe technique for tub transfer with use of 3 in 1. Pt demonstrated understanding. Pt safe to DC home with intermittent S when medically stable.     Follow Up Recommendations  No OT follow up;Supervision - Intermittent    Equipment Recommendations  3 in 1 bedside commode    Recommendations for Other Services       Precautions / Restrictions Precautions Precautions: Fall;Knee Required Braces or Orthoses: Other Brace/Splint Other Brace/Splint: Zero knee Restrictions Weight Bearing Restrictions: Yes LLE Weight Bearing: Weight bearing as tolerated      Mobility Bed Mobility               General bed mobility comments: OOB in chair  Transfers Overall transfer level: Needs assistance Equipment used: Rolling walker (2 wheeled) Transfers: Sit to/from [facility] Sit to Stand: Supervision Stand pivot transfers: Supervision       General transfer comment: Cues for hand placement and use of RW    Balance Overall balance assessment: Needs assistance   Sitting balance-Leahy Scale: Good       Standing balance-Leahy Scale: Fair Standing balance comment: Can stand without support, uses support for dynamic mobility                           ADL either performed or assessed with clinical judgement   ADL Overall ADL's : Needs assistance/impaired                                     Functional mobility during ADLs: Supervision/safety General ADL Comments: Pt overall set/S for LB ADL. Completed  education with pt/wife on sfae trnasfer technique for tub. Pt/wife able to safely return demonstrate. also educated pm reducing risk of falls. Pt/wife verbalized understnading.                          Pertinent Vitals/Pain Pain Assessment: 0-10 Pain Score: 5  Pain Location: Left knee Pain Descriptors / Indicators: Aching;Sore Pain Intervention(s): Limited activity within patient's tolerance;Ice applied     Hand Dominance Right   Extremity/Trunk Assessment Upper Extremity Assessment Upper Extremity Assessment: Overall WFL for tasks assessed   Lower Extremity Assessment Lower Extremity Assessment: Defer to PT evaluation   Cervical / Trunk Assessment Cervical / Trunk Assessment: Normal   Communication Communication Communication: No difficulties   Cognition Arousal/Alertness: Awake/alert Behavior During Therapy: WFL for tasks assessed/performed Overall Cognitive Status: Within Functional Limits for tasks assessed                                                      Home Living Family/patient expects to be discharged to:: Private residence Living Arrangements: Spouse/significant other Available Help at Discharge: Family Type of Home: House Home Access: Stairs to enter [employer] of Steps:  1   Home Layout: Two level;Able to live on main level with bedroom/bathroom Alternate Level Stairs-Number of Steps: 15   Bathroom Shower/Tub: Tub/shower unit;Curtain   Bathroom Toilet: Handicapped height Bathroom Accessibility: Yes How Accessible: Accessible via walker Home Equipment: Lane - 2 wheels;Bedside commode          Prior Functioning/Environment Level of Independence: Independent        Comments: very active        OT Problem List: Decreased strength;Decreased range of motion;Decreased activity tolerance;Decreased knowledge of use of DME or AE;Pain      OT Treatment/Interventions:      OT Goals(Current goals can be  found in the care plan section) Acute Rehab OT Goals Patient Stated Goal: To get better OT Goal Formulation: All assessment and education complete, DC therapy  OT Frequency:     Barriers to D/C:            Co-evaluation              End of Session Equipment Utilized During Treatment: Gait belt;Rolling walker  Activity Tolerance: Patient tolerated treatment well Patient left: in chair;with call bell/phone within reach;Other (comment) (zero knee foam)  OT Visit Diagnosis: Unsteadiness on feet (R26.81);Pain Pain - Right/Left: Left Pain - part of body: Knee                Time: 1011-1040 OT Time Calculation (min): 29 min Charges:  OT General Charges $OT Visit: 1 Procedure OT Evaluation $OT Eval Low Complexity: 1 Procedure OT Treatments $Self Care/Home Management : 8-22 mins G-Codes:     [facility], OT/L  [phone] 12/15/2016  Helana Macbride,HILLARY 12/15/2016, 11:36 AM

## 2016-12-15 NOTE — Progress Notes (Signed)
qPhysical Therapy Treatment Patient Details Name: Connor Adams MRN: [id number] DOB: 01-26-1946 Today's Date: 12/15/2016    History of Present Illness 71 y.o. 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    PT Comments    Pt was treated today for TKA with ice applied after gait and exercises, and is leaving imminently to go home.  He is scheduled for outpatient therapy and is most appropriate for this setting still, having walked with only minor help for his mobility.  He is demonstrating better ROM to flex L knee but stiffer with ext, but noted some edema that was addressed with ice after the visit.  Will follow up as needed while here for BID visits, focusing on ROM and balance, endurance with gait.   Follow Up Recommendations  Supervision - Intermittent;Outpatient PT     Equipment Recommendations  Rolling walker with 5" wheels;3in1 (PT)    Recommendations for Other Services       Precautions / Restrictions Precautions Precautions: Fall;Knee Precaution Booklet Issued: Yes (comment) Required Braces or Orthoses: Other Brace/Splint Other Brace/Splint: Zero knee Restrictions Weight Bearing Restrictions: Yes LLE Weight Bearing: Weight bearing as tolerated    Mobility  Bed Mobility               General bed mobility comments: OOB in chair  Transfers Overall transfer level: Needs assistance Equipment used: Rolling walker (2 wheeled) Transfers: Sit to/from [facility] Sit to Stand: Min guard Stand pivot transfers: Min guard       General transfer comment: cued hand placement and safety  Ambulation/Gait Ambulation/Gait assistance: Min guard Ambulation Distance (Feet): 200 Feet Assistive device: Rolling walker (2 wheeled) Gait Pattern/deviations: Step-to pattern;Step-through pattern;Antalgic         Stairs Stairs:  (declined as he has done several times)           Wheelchair Mobility    Modified Rankin (Stroke Patients Only)       Balance Overall balance assessment: Needs assistance   Sitting balance-Leahy Scale: Good     Standing balance support: Bilateral upper extremity supported Standing balance-Leahy Scale: Fair Standing balance comment: Can stand without support, uses support for dynamic mobility                            Cognition Arousal/Alertness: Awake/alert Behavior During Therapy: WFL for tasks assessed/performed Overall Cognitive Status: Within Functional Limits for tasks assessed                                        Exercises Total Joint Exercises Ankle Circles/Pumps: AROM;Both;5 reps Quad Sets: AROM;Both;10 reps Gluteal Sets: AROM;Both;10 reps Heel Slides: AAROM;AROM;Both;10 reps Hip ABduction/ADduction: AROM;Both;10 reps Straight Leg Raises: AAROM;Left;10 reps Goniometric ROM: 10/94    General Comments General comments (skin integrity, edema, etc.): pt is up to walk with good effort, ROM on knee was improved with no increase in pain      Pertinent Vitals/Pain Pain Assessment: 0-10 Pain Score: 2  (6 with walk and 4 after at rest) Pain Location: Left knee Pain Descriptors / Indicators: Aching;Sore Pain Intervention(s): Limited activity within patient's tolerance;Monitored during session;Premedicated before session;Repositioned;Ice applied    Home Living Family/patient expects to be discharged to:: Private residence Living Arrangements: Spouse/significant other Available Help at  Discharge: Family Type of Home: House Home Access: Stairs to enter   Home Layout: Two level;Able to live on main level with bedroom/bathroom Home Equipment: Gilford Rile - 2 wheels;Bedside commode      Prior Function Level of Independence: Independent      Comments: very active   PT Goals (current goals can now be found in the care plan section) Acute Rehab PT Goals Patient Stated Goal: To get  better Progress towards PT goals: Progressing toward goals    Frequency    BID      PT Plan Current plan remains appropriate    Co-evaluation             End of Session Equipment Utilized During Treatment: Gait belt Activity Tolerance: Patient tolerated treatment well Patient left: in chair;with call bell/phone within reach Nurse Communication: Mobility status PT Visit Diagnosis: Unsteadiness on feet (R26.81);Pain Pain - Right/Left: Left Pain - part of body: Knee     Time: 9562-1308 PT Time Calculation (min) (ACUTE ONLY): 27 min  Charges:  $Gait Training: 8-22 mins $Therapeutic Exercise: 8-22 mins                    G Codes:  Functional Assessment Tool Used: AM-PAC 6 Clicks Basic Mobility      Ramond Dial 12/15/2016, 1:07 PM   Mee Hives, PT MS Acute Rehab Dept. Number: [place]

## 2016-12-15 NOTE — Progress Notes (Signed)
Discharge instructions printed and reviewed with patient and spouse, and copy given for them to take home. All questions addressed at this time. New prescriptions reviewed and paper scripts given for them to fill at their usual outpatient pharmacy. IV and ace wrap on leg removed. Room searched for patient belongings, and confirmed with patient that all valuables were accounted for. Spouse assisted patient to dress, then staff escorted patient to discharge via wheelchair.

## 2016-12-15 NOTE — Clinical Social Work Note (Addendum)
CSW consulted for SNF placement for STR. P/T recommending outpatient P/T and O/T recommending no follow up. RNCM aware and following for d/c needs. CSW signing off as no further SW needs identified. Please reconsult if new SW needs arise.   Connor Adams, [place], [place] Work (coverage) [phone]

## 2016-12-15 NOTE — Discharge Summary (Signed)
Patient ID: EDRICK WHITEHORN MRN: [id number] DOB/AGE: 71/01/1946 71 y.o.  Admit date: 12/13/2016 Discharge date: 12/15/2016  Admission Diagnoses:  Principal Problem:   Primary osteoarthritis of left knee Active Problems:   Primary localized osteoarthritis of left knee   Discharge Diagnoses:  Same  Past Medical History:  Diagnosis Date  . Arthritis   . Chronic kidney disease (CKD), stage III (moderate)   . GERD (gastroesophageal reflux disease)    occ  . Hemorrhoids   . History of ETT 3/05   low risk  . History of hiatal hernia   . History of kidney stones   . HLD (hyperlipidemia)   . HTN (hypertension)   . Left inguinal hernia   . Melanoma ([place])    excied  . 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 Stroke [facility]) 2011   tia   . TIA (transient ischemic attack) 4/11   associated w slurred speecha ndmild facial droop. lasted for about 10 minutes. Had MRI, etc with [facility] that per his report was ok (dr. Stephannie Li). Echo (4/11): EF 55-60%, no regional WMAs, normal diastolic function, mild LAE, no source of embolus    Surgeries: Procedure(s): TOTAL KNEE ARTHROPLASTY WITH RIGHT KNEE CORTISONE INJECTION on 12/13/2016   Consultants:   Discharged Condition: Improved  Hospital Course: Connor Adams is an 71 y.o. male who was admitted 12/13/2016 for operative treatment ofPrimary osteoarthritis of left knee. Patient has severe unremitting pain that affects sleep, daily activities, and work/hobbies. After pre-op clearance the patient was taken to the operating room on 12/13/2016 and underwent  Procedure(s): TOTAL KNEE ARTHROPLASTY WITH RIGHT KNEE CORTISONE INJECTION.    Patient was given perioperative antibiotics: Anti-infectives    Start     Dose/Rate Route Frequency Ordered Stop   12/13/16 1000  ceFAZolin (ANCEF) IVPB 2g/100 mL premix     2 g 200 mL/hr over 30 Minutes Intravenous To [facility] 12/12/16 1541 12/13/16 1140       Patient was given  sequential compression devices, early ambulation, and chemoprophylaxis to prevent DVT.  Patient benefited maximally from hospital stay and there were no complications.    Recent vital signs: Patient Vitals for the past 24 hrs:  BP Temp Temp src Pulse Resp SpO2  12/15/16 0427 (!) 158/81 98.8 F (37.1 C) Oral 61 16 97 %  12/14/16 1900 130/81 98.3 F (36.8 C) Oral 76 16 96 %  12/14/16 1402 139/69 98.7 F (37.1 C) Oral 62 14 100 %     Recent laboratory studies:  Recent Labs  12/14/16 0509 12/15/16 0309  WBC 9.3 9.6  HGB 12.5* 12.1*  HCT 38.3* 37.0*  PLT 154 136*  NA 136  --   K 4.0  --   CL 100*  --   CO2 27  --   BUN 15  --   CREATININE 1.31*  --   GLUCOSE 138*  --   CALCIUM 8.6*  --      Discharge Medications:   Allergies as of 12/15/2016      Reactions   No Known Allergies       Medication List    TAKE these medications   ARTIFICIAL TEAR OP Apply 1 drop to eye daily as needed (dry eyes).   aspirin 325 MG tablet Take 1 tablet (325 mg total) by mouth 2 (two) times daily. What changed:  how much to take   CRESTOR 5 MG tablet Generic drug:  rosuvastatin Take 5 mg by mouth every  evening.   Fish Oil 1200 MG Caps Take 1,200 mg by mouth 2 (two) times daily.   fluticasone 50 MCG/ACT nasal spray Commonly known as:  FLONASE Place 1 spray into both nostrils as needed for allergies.   ibuprofen 200 MG tablet Commonly known as:  ADVIL,MOTRIN Take 400 mg by mouth daily as needed for headache or moderate pain. For  pain   metoprolol tartrate 25 MG tablet Commonly known as:  LOPRESSOR Take 25 mg by mouth 2 (two) times daily.   multivitamins ther. w/minerals Tabs tablet Take 1 tablet by mouth daily.   oxyCODONE-acetaminophen 5-325 MG tablet Commonly known as:  ROXICET Take 1-2 tablets by mouth every 4 (four) hours as needed.   PROAIR HFA 108 (90 Base) MCG/ACT inhaler Generic drug:  albuterol Inhale 2 puffs into the lungs as needed for wheezing or shortness of  breath.   PROCTOSOL HC 2.5 % rectal cream Generic drug:  hydrocortisone Place 1 application rectally daily as needed for hemorrhoids or itching.   tiZANidine 2 MG tablet Commonly known as:  ZANAFLEX Take 1 tablet (2 mg total) by mouth every 6 (six) hours as needed for muscle spasms.            Durable Medical Equipment        Start     Ordered   12/13/16 1803  DME Walker rolling  Once    Question:  Patient needs a walker to treat with the following condition  Answer:  Primary localized osteoarthritis of left knee   12/13/16 1802   12/13/16 1803  DME 3 n 1  Once     12/13/16 1802   12/13/16 1803  DME Bedside commode  Once    Question:  Patient needs a bedside commode to treat with the following condition  Answer:  Primary localized osteoarthritis of left knee   12/13/16 1802      Diagnostic Studies: No results found.  Disposition: 01-Home or Self Care  Discharge Instructions    Call MD / Call 911    Complete by:  As directed    If you experience chest pain or shortness of breath, CALL 911 and be transported to the hospital emergency room.  If you develope a fever above 101 F, pus (white drainage) or increased drainage or redness at the wound, or calf pain, call your surgeon's office.   Constipation Prevention    Complete by:  As directed    Drink plenty of fluids.  Prune juice may be helpful.  You may use a stool softener, such as Colace (over the counter) 100 mg twice a day.  Use MiraLax (over the counter) for constipation as needed.   Diet - low sodium heart healthy    Complete by:  As directed    Driving restrictions    Complete by:  As directed    No driving for 2 weeks   Increase activity slowly as tolerated    Complete by:  As directed    Patient may shower    Complete by:  As directed    You may shower without a dressing once there is no drainage.  Do not wash over the wound.  If drainage remains, cover wound with plastic wrap and then shower.       Follow-up Information    [name], MD Follow up in 2 week(s).   Specialty:  Orthopedic Surgery Contact information: [place] [address] [phone]        [facility] Follow up.   Specialty:  [place] Why:  A [facility] representative will contact you to arrange start date and time for your therapy. Contact information: [address] [place] [address] [phone]            Signed: Hardin Negus, Fransisca Shawn R 12/15/2016, 7:49 AM

## 2018-09-22 ENCOUNTER — Other Ambulatory Visit (HOSPITAL_COMMUNITY): Payer: Self-pay | Admitting: Orthopedic Surgery

## 2018-09-22 ENCOUNTER — Other Ambulatory Visit: Payer: Self-pay | Admitting: Orthopedic Surgery

## 2018-09-22 DIAGNOSIS — M25562 Pain in left knee: Secondary | ICD-10-CM

## 2018-10-02 ENCOUNTER — Encounter (HOSPITAL_COMMUNITY)
Admission: RE | Admit: 2018-10-02 | Discharge: 2018-10-02 | Disposition: A | Discharge status: Home / Self Care | Payer: Medicare Other | Source: Ambulatory Visit | Attending: Orthopedic Surgery | Admitting: Orthopedic Surgery

## 2018-10-02 ENCOUNTER — Ambulatory Visit (HOSPITAL_COMMUNITY)
Admission: RE | Admit: 2018-10-02 | Discharge: 2018-10-02 | Disposition: A | Discharge status: Home / Self Care | Payer: Medicare Other | Source: Ambulatory Visit | Attending: Orthopedic Surgery | Admitting: Orthopedic Surgery

## 2018-10-02 DIAGNOSIS — M25562 Pain in left knee: Secondary | ICD-10-CM | POA: Insufficient documentation

## 2018-10-02 MED ORDER — TECHNETIUM TC 99M MEDRONATE IV KIT
21.3000 | PACK | Freq: Once | INTRAVENOUS | Status: AC | PRN
  Administered 2018-10-02: 21.3 via INTRAVENOUS

## 2019-11-16 ENCOUNTER — Ambulatory Visit: Payer: Medicare PPO | Attending: Internal Medicine

## 2019-11-16 ENCOUNTER — Ambulatory Visit: Payer: Self-pay

## 2019-11-16 ENCOUNTER — Ambulatory Visit: Payer: Medicare PPO

## 2019-11-16 DIAGNOSIS — Z23 Encounter for immunization: Secondary | ICD-10-CM | POA: Insufficient documentation

## 2019-11-16 NOTE — Progress Notes (Signed)
   Covid-19 Vaccination Clinic  Name:  Connor Adams    MRN: [id number] DOB: [birth year]  11/16/2019  Mr. Connor Adams was observed post Covid-19 immunization for 15 minutes without incidence. He was provided with Vaccine Information Sheet and instruction to access the V-Safe system.   Mr. Connor Adams was instructed to call 911 with any severe reactions post vaccine: Marland Kitchen Difficulty breathing  . Swelling of your face and throat  . A fast heartbeat  . A bad rash all over your body  . Dizziness and weakness    Immunizations Administered    Name Date Dose VIS Date Route   Pfizer COVID-19 Vaccine 11/16/2019  1:50 PM 0.3 mL 08/31/2019 Intramuscular   Manufacturer: [place]   Lot: [id number]   [place]: [id number]

## 2019-12-12 ENCOUNTER — Ambulatory Visit: Payer: Medicare PPO | Attending: Internal Medicine

## 2019-12-12 DIAGNOSIS — Z23 Encounter for immunization: Secondary | ICD-10-CM

## 2019-12-12 NOTE — Progress Notes (Signed)
   Covid-19 Vaccination Clinic  Name:  GUTHRIE CRISP    MRN: [id number] DOB: [birth year]  12/12/2019  Mr. Wishon was observed post Covid-19 immunization for 15 minutes without incident. He was provided with Vaccine Information Sheet and instruction to access the V-Safe system.   Mr. Sheldon was instructed to call 911 with any severe reactions post vaccine: Marland Kitchen Difficulty breathing  . Swelling of face and throat  . A fast heartbeat  . A bad rash all over body  . Dizziness and weakness   Immunizations Administered    Name Date Dose VIS Date Route   Pfizer COVID-19 Vaccine 12/12/2019  8:26 AM 0.3 mL 08/31/2019 Intramuscular   Manufacturer: [place]   Lot: [id number]   [place]: [id number]

## 2020-01-02 DIAGNOSIS — K118 Other diseases of salivary glands: Secondary | ICD-10-CM | POA: Diagnosis not present

## 2020-02-14 DIAGNOSIS — Z85828 Personal history of other malignant neoplasm of skin: Secondary | ICD-10-CM | POA: Diagnosis not present

## 2020-02-14 DIAGNOSIS — L570 Actinic keratosis: Secondary | ICD-10-CM | POA: Diagnosis not present

## 2020-02-14 DIAGNOSIS — X32XXXD Exposure to sunlight, subsequent encounter: Secondary | ICD-10-CM | POA: Diagnosis not present

## 2020-02-14 DIAGNOSIS — Z08 Encounter for follow-up examination after completed treatment for malignant neoplasm: Secondary | ICD-10-CM | POA: Diagnosis not present

## 2020-04-09 DIAGNOSIS — N183 Chronic kidney disease, stage 3 unspecified: Secondary | ICD-10-CM | POA: Diagnosis not present

## 2020-04-09 DIAGNOSIS — R1012 Left upper quadrant pain: Secondary | ICD-10-CM | POA: Diagnosis not present

## 2020-04-09 DIAGNOSIS — R7309 Other abnormal glucose: Secondary | ICD-10-CM | POA: Diagnosis not present

## 2020-04-09 DIAGNOSIS — I10 Essential (primary) hypertension: Secondary | ICD-10-CM | POA: Diagnosis not present

## 2020-04-09 DIAGNOSIS — R7303 Prediabetes: Secondary | ICD-10-CM | POA: Diagnosis not present

## 2020-04-17 ENCOUNTER — Other Ambulatory Visit: Payer: Self-pay | Admitting: Family Medicine

## 2020-04-17 ENCOUNTER — Ambulatory Visit
Admission: RE | Admit: 2020-04-17 | Discharge: 2020-04-17 | Disposition: A | Discharge status: Home / Self Care | Payer: Medicare PPO | Source: Ambulatory Visit | Attending: Family Medicine | Admitting: Family Medicine

## 2020-04-17 DIAGNOSIS — N200 Calculus of kidney: Secondary | ICD-10-CM | POA: Diagnosis not present

## 2020-04-17 DIAGNOSIS — M4319 Spondylolisthesis, multiple sites in spine: Secondary | ICD-10-CM | POA: Diagnosis not present

## 2020-04-17 DIAGNOSIS — I700 Atherosclerosis of aorta: Secondary | ICD-10-CM | POA: Diagnosis not present

## 2020-04-17 DIAGNOSIS — R1012 Left upper quadrant pain: Secondary | ICD-10-CM

## 2020-04-17 DIAGNOSIS — K6389 Other specified diseases of intestine: Secondary | ICD-10-CM | POA: Diagnosis not present

## 2020-04-17 MED ORDER — IOPAMIDOL (ISOVUE-300) INJECTION 61%
100.0000 mL | Freq: Once | INTRAVENOUS | Status: AC | PRN
  Administered 2020-04-17: 100 mL via INTRAVENOUS

## 2020-04-18 ENCOUNTER — Telehealth: Payer: Self-pay | Admitting: Hematology and Oncology

## 2020-04-18 ENCOUNTER — Other Ambulatory Visit (HOSPITAL_COMMUNITY): Payer: Self-pay | Admitting: Family Medicine

## 2020-04-18 ENCOUNTER — Other Ambulatory Visit: Payer: Self-pay | Admitting: Family Medicine

## 2020-04-18 DIAGNOSIS — R599 Enlarged lymph nodes, unspecified: Secondary | ICD-10-CM

## 2020-04-18 DIAGNOSIS — R918 Other nonspecific abnormal finding of lung field: Secondary | ICD-10-CM

## 2020-04-18 DIAGNOSIS — K6389 Other specified diseases of intestine: Secondary | ICD-10-CM

## 2020-04-18 DIAGNOSIS — N289 Disorder of kidney and ureter, unspecified: Secondary | ICD-10-CM

## 2020-04-18 NOTE — Telephone Encounter (Signed)
Received an urgent referral from Dr. Shirline Frees at [facility] for enlarged lymph nodes/highly suspicious for lymphoma. Mr. Schiele has been scheduled to see Dr. Lorenso Courier on 8/5 at 8am. Appt date and time has been given to the pt's wife. Aware to arrive 15 minutes early.

## 2020-04-21 ENCOUNTER — Telehealth: Payer: Medicare PPO | Admitting: Physician Assistant

## 2020-04-21 DIAGNOSIS — R12 Heartburn: Secondary | ICD-10-CM

## 2020-04-21 NOTE — Progress Notes (Signed)
Based on what you shared with me, I feel your condition warrants further evaluation and I recommend that you be seen for a face to face office visit. Sometimes symptoms of heartburn can also indicate a more serious process occurring such as a heart problem. Due to your comorbid conditions, I feel you will need to be seen in person to further evaluate this complaint.    NOTE: If you entered your credit card information for this eVisit, you will not be charged. You may see a "hold" on your card for the $35 but that hold will drop off and you will not have a charge processed.   If you are having a true medical emergency please call 911.      For an urgent face to face visit, [facility] has five urgent care centers for your convenience:      NEW:  [facility] [place] [facility] [place] Get Driving Directions [phone] [place], [place] [address] . 10 am - 6pm Monday - Friday    [facility]) Get Driving Directions [phone] [address] [place], [place] [address] . 10 am to 8 pm Monday-Friday . 12 pm to 8 pm [facility] Get Driving Directions [phone] [place], [place], [place] [address] . 8 am to 8 pm Monday-Friday . 9 am to 6 pm Saturday . 11 am to 6 pm Sunday     [facility] at [facility] Get Driving Directions  [phone] [address] [place], [place] [address] . 8 am to 8 pm Monday-Friday . 8 am to 4 pm [facility] [place] Get Driving Directions [phone] [place] [place] [place] [address] . 12 pm to 6 pm Monday-Friday      Your e-visit answers were reviewed by a board certified advanced clinical practitioner to complete your personal care plan.  Thank you for using e-Visits.   Greater than 5 minutes, yet less than 10 minutes of time  have been spend researching, coordinating, and implementing care for this patient today.

## 2020-04-24 ENCOUNTER — Inpatient Hospital Stay: Payer: Medicare PPO | Attending: Hematology and Oncology | Admitting: Hematology and Oncology

## 2020-04-24 ENCOUNTER — Other Ambulatory Visit: Payer: Self-pay

## 2020-04-24 ENCOUNTER — Inpatient Hospital Stay: Payer: Medicare PPO

## 2020-04-24 ENCOUNTER — Encounter: Payer: Self-pay | Admitting: Hematology and Oncology

## 2020-04-24 VITALS — BP 149/70 | HR 61 | Temp 97.9°F | Resp 20 | Ht 71.0 in | Wt 182.8 lb

## 2020-04-24 DIAGNOSIS — N400 Enlarged prostate without lower urinary tract symptoms: Secondary | ICD-10-CM | POA: Diagnosis not present

## 2020-04-24 DIAGNOSIS — I700 Atherosclerosis of aorta: Secondary | ICD-10-CM | POA: Insufficient documentation

## 2020-04-24 DIAGNOSIS — Z79899 Other long term (current) drug therapy: Secondary | ICD-10-CM | POA: Insufficient documentation

## 2020-04-24 DIAGNOSIS — R1901 Right upper quadrant abdominal swelling, mass and lump: Secondary | ICD-10-CM

## 2020-04-24 DIAGNOSIS — R1900 Intra-abdominal and pelvic swelling, mass and lump, unspecified site: Secondary | ICD-10-CM | POA: Diagnosis not present

## 2020-04-24 DIAGNOSIS — Z8673 Personal history of transient ischemic attack (TIA), and cerebral infarction without residual deficits: Secondary | ICD-10-CM | POA: Insufficient documentation

## 2020-04-24 DIAGNOSIS — R591 Generalized enlarged lymph nodes: Secondary | ICD-10-CM | POA: Insufficient documentation

## 2020-04-24 DIAGNOSIS — N183 Chronic kidney disease, stage 3 unspecified: Secondary | ICD-10-CM | POA: Diagnosis not present

## 2020-04-24 DIAGNOSIS — K6389 Other specified diseases of intestine: Secondary | ICD-10-CM

## 2020-04-24 DIAGNOSIS — Z85820 Personal history of malignant melanoma of skin: Secondary | ICD-10-CM | POA: Diagnosis not present

## 2020-04-24 DIAGNOSIS — M1990 Unspecified osteoarthritis, unspecified site: Secondary | ICD-10-CM | POA: Insufficient documentation

## 2020-04-24 DIAGNOSIS — E785 Hyperlipidemia, unspecified: Secondary | ICD-10-CM | POA: Diagnosis not present

## 2020-04-24 DIAGNOSIS — I129 Hypertensive chronic kidney disease with stage 1 through stage 4 chronic kidney disease, or unspecified chronic kidney disease: Secondary | ICD-10-CM | POA: Insufficient documentation

## 2020-04-24 DIAGNOSIS — C8338 Diffuse large B-cell lymphoma, lymph nodes of multiple sites: Secondary | ICD-10-CM | POA: Insufficient documentation

## 2020-04-24 DIAGNOSIS — K219 Gastro-esophageal reflux disease without esophagitis: Secondary | ICD-10-CM | POA: Diagnosis not present

## 2020-04-24 DIAGNOSIS — Z87442 Personal history of urinary calculi: Secondary | ICD-10-CM | POA: Insufficient documentation

## 2020-04-24 LAB — URIC ACID: Uric Acid, Serum: 7.4 mg/dL (ref 3.7–8.6)

## 2020-04-24 LAB — CBC WITH DIFFERENTIAL (CANCER CENTER ONLY)
Abs Immature Granulocytes: 0.01 10*3/uL (ref 0.00–0.07)
Basophils Absolute: 0.1 10*3/uL (ref 0.0–0.1)
Basophils Relative: 1 %
Eosinophils Absolute: 0.2 10*3/uL (ref 0.0–0.5)
Eosinophils Relative: 3 %
HCT: 40.8 % (ref 39.0–52.0)
Hemoglobin: 13.2 g/dL (ref 13.0–17.0)
Immature Granulocytes: 0 %
Lymphocytes Relative: 10 %
Lymphs Abs: 0.7 10*3/uL (ref 0.7–4.0)
MCH: 30.3 pg (ref 26.0–34.0)
MCHC: 32.4 g/dL (ref 30.0–36.0)
MCV: 93.6 fL (ref 80.0–100.0)
Monocytes Absolute: 0.5 10*3/uL (ref 0.1–1.0)
Monocytes Relative: 8 %
Neutro Abs: 5.2 10*3/uL (ref 1.7–7.7)
Neutrophils Relative %: 78 %
Platelet Count: 248 10*3/uL (ref 150–400)
RBC: 4.36 MIL/uL (ref 4.22–5.81)
RDW: 12.4 % (ref 11.5–15.5)
WBC Count: 6.6 10*3/uL (ref 4.0–10.5)
nRBC: 0 % (ref 0.0–0.2)

## 2020-04-24 LAB — CMP (CANCER CENTER ONLY)
ALT: 15 U/L (ref 0–44)
AST: 20 U/L (ref 15–41)
Albumin: 3.7 g/dL (ref 3.5–5.0)
Alkaline Phosphatase: 70 U/L (ref 38–126)
Anion gap: 10 (ref 5–15)
BUN: 20 mg/dL (ref 8–23)
CO2: 27 mmol/L (ref 22–32)
Calcium: 10.3 mg/dL (ref 8.9–10.3)
Chloride: 105 mmol/L (ref 98–111)
Creatinine: 1.28 mg/dL — ABNORMAL HIGH (ref 0.61–1.24)
GFR, Est AFR Am: >60 mL/min (ref >60)
GFR, Estimated: 55 mL/min — ABNORMAL LOW (ref >60)
Glucose, Bld: 99 mg/dL (ref 70–99)
Potassium: 4.3 mmol/L (ref 3.5–5.1)
Sodium: 142 mmol/L (ref 135–145)
Total Bilirubin: 0.5 mg/dL (ref 0.3–1.2)
Total Protein: 7.7 g/dL (ref 6.5–8.1)

## 2020-04-24 LAB — SEDIMENTATION RATE: Sed Rate: 44 mm/hr — ABNORMAL HIGH (ref 0–16)

## 2020-04-24 LAB — C-REACTIVE PROTEIN: CRP: 7.8 mg/dL — ABNORMAL HIGH (ref <1.0)

## 2020-04-24 LAB — LACTATE DEHYDROGENASE: LDH: 341 U/L — ABNORMAL HIGH (ref 98–192)

## 2020-04-24 LAB — SAVE SMEAR(SSMR), FOR PROVIDER SLIDE REVIEW

## 2020-04-24 NOTE — Progress Notes (Signed)
[place] Telephone:([phone]   Fax:([phone] [place] NOTE  Patient Care Team: Shirline Frees, MD as PCP - [facility] (Family Medicine)  Hematological/Oncological History # Lymphadenopathy, concerning for Lymphoma 1) 04/17/2020:  CT A/P showed dominant nodal mass (11.5 x 10.9 cm) within the jejunal mesentery, highly suspicious for lymphoma. Additionally there are left lower lobe pleural-based pulmonary nodules 2) 04/24/2020: establish care with Dr. Lorenso Courier 3) 04/28/2020: intended date for PET CT scan.   CHIEF COMPLAINTS/PURPOSE OF CONSULTATION:  "Large abdominal mass"  HISTORY OF PRESENTING ILLNESS:  Connor Adams 74 y.o. 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  On review of the previous records Connor Adams underwent a CT abdomen pelvis on 04/17/2020 at which time a nodal mass 11.5 x 10.9 cm was noted within the jejunal mesentery.  There were also left lower lobe pleural-based pulmonary nodules noted at that time as well as soft tissue fullness at the ileocecal valve.  Due to concern for this finding the patient was referred to oncology for further evaluation and management.  On exam today Connor Adams is accompanied by his wife.  He notes that he has been having soreness for several years over his left upper quadrant.  He notes that he has soreness in his lower ribs and this acutely worsened approximately 3 weeks ago when he had worsening heartburn.  He was prescribed a 14-day course of antacids and he did notice some relief in his symptoms at that time.  After completion of that therapy he did have return of belching and heartburn and he went into his primary care provider.  The PCP felt a mass in his abdomen and had a CT scan performed on 04/17/2020 which showed the above findings.  On further discussion Connor Adams notes that he has not had any issues with  fevers, chills, sweats, nausea, vomiting or diarrhea since has been having issues with the heartburn.  He notes that his weight has dropped about 3 pounds but his energy levels have been good and he has been very physically active doing weightlifting exercises and working outside.  He also notes that he did have a Covid infection in December 2020 at which time he lost his sense of smell, taste, and he did have a headache.  His taste has not yet entirely returned.  In terms of his cancer history he does note that he had melanoma removed from his back and his face with records of the melanoma being removed in 2006.  He follows with dermatology every 6 months.  His mother has history of uterine cancer and his sister unfortunately died from liver cancer.  He has no other family history markable for malignancy.  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  He notes that his pain in the abdomen is currently one of the 10 in severity but otherwise he is not limited in his day-to-day activities.  A full 10 point ROS is listed below.  MEDICAL HISTORY:  Past Medical History:  Diagnosis Date  . Arthritis   . Chronic kidney disease (CKD), stage III (moderate)   . GERD (gastroesophageal reflux disease)    occ  . Hemorrhoids   . History of ETT 3/05   low risk  . History of hiatal hernia   . History of kidney stones   . HLD (hyperlipidemia)   .  HTN (hypertension)   . Left inguinal hernia   . Melanoma ([place])    excied  . 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 Stroke [facility]) 2011   tia   . TIA (transient ischemic attack) 4/11   associated w slurred speecha ndmild facial droop. lasted for about 10 minutes. Had MRI, etc with [facility] that per his report was ok (dr. Stephannie Li). Echo (4/11): EF 55-60%, no regional WMAs, normal diastolic function, mild LAE, no source of embolus    SURGICAL HISTORY: Past Surgical History:  Procedure Laterality Date  . BACK  SURGERY    . BMI 30.2 muscular  12/08/04  . curretage actinic keratosis R ear  01/20/05  . ETT low prob of CAD  11/21/03  . excision melanoma in situ back  01/20/05  . hemorrhoids sclerosed at colonoscopy  12/19/05  . LAPAROSCOPIC INGUINAL HERNIA REPAIR  12/20/95  . retinal laser surgery  09/20/86  . SEPTOPLASTY  11/19/98  . TOTAL KNEE ARTHROPLASTY Left 12/13/2016   Procedure: TOTAL KNEE ARTHROPLASTY WITH RIGHT KNEE CORTISONE INJECTION;  Surgeon: Frederik Pear, MD;  Location: [place];  Service: Orthopedics;  Laterality: Left;  . uric acid 6.9  12/22/04  . x-ray l great toe  07/21/00    SOCIAL HISTORY: Social History   Socioeconomic History  . Marital status: Married    Spouse name: [name]  . Number of children: 2  . Years of education: 50  . Highest education level: Not on file  Occupational History  . Occupation: Systems developer: [employer]  Tobacco Use  . Smoking status: Never Smoker  . Smokeless tobacco: Never Used  Substance and Sexual Activity  . Alcohol use: No  . Drug use: No  . Sexual activity: Not on file  Other Topics Concern  . Not on file  Social History Narrative   Married to [employer] at [employer].   Son, [name] married   Son, [name], Married.    Caffeine use: 1 cup coffee per day   Social Determinants of Health   Financial Resource Strain:   . Difficulty of Paying Living Expenses:   Food Insecurity:   . Worried About Charity fundraiser in the Last Year:   . Arboriculturist in the Last Year:   Transportation Needs:   . Film/video editor (Medical):   Marland Kitchen Lack of Transportation (Non-Medical):   Physical Activity:   . Days of Exercise per Week:   . Minutes of Exercise per Session:   Stress:   . Feeling of Stress :   Social Connections:   . Frequency of Communication with Friends and Family:   . Frequency of Social Gatherings with Friends and Family:   . Attends Religious Services:   . Active Member of Clubs or Organizations:     . Attends Archivist Meetings:   Marland Kitchen Marital Status:   Intimate Partner Violence:   . Fear of Current or Ex-Partner:   . Emotionally Abused:   Marland Kitchen Physically Abused:   . Sexually Abused:     FAMILY HISTORY: Family History  Problem Relation Age of Onset  . Heart failure Brother 44       CABGx4  . Heart attack Mother 5  . Heart attack Father 35  . Asthma Son   . Diabetes Brother     ALLERGIES:  is allergic to no known allergies.  MEDICATIONS:  Current Outpatient Medications  Medication Sig Dispense Refill  . indomethacin (INDOCIN) 50 MG capsule     . losartan (COZAAR) 25 MG tablet     . ARTIFICIAL TEAR OP Apply 1 drop to eye daily as needed (dry eyes).    Marland Kitchen aspirin 325 MG tablet Take 1 tablet (325 mg total) by mouth 2 (two) times daily. 30 tablet 0  . fluticasone (FLONASE) 50 MCG/ACT nasal spray Place 1 spray into both nostrils as needed for allergies.     . hydrocortisone (PROCTOSOL HC) 2.5 % rectal cream Place 1 application rectally daily as needed for hemorrhoids or itching.    Marland Kitchen ibuprofen (ADVIL,MOTRIN) 200 MG tablet Take 400 mg by mouth daily as needed for headache or moderate pain. For  pain    . Multiple Vitamins-Minerals (MULTIVITAMINS THER. W/MINERALS) TABS Take 1 tablet by mouth daily.      . Omega-3 Fatty Acids (FISH OIL) 1200 MG CAPS Take 1,200 mg by mouth 2 (two) times daily.    . pantoprazole (PROTONIX) 40 MG tablet     . PROAIR HFA 108 (90 Base) MCG/ACT inhaler Inhale 2 puffs into the lungs as needed for wheezing or shortness of breath.     . rosuvastatin (CRESTOR) 5 MG tablet Take 5 mg by mouth every evening.     Marland Kitchen tiZANidine (ZANAFLEX) 2 MG tablet Take 1 tablet (2 mg total) by mouth every 6 (six) hours as needed for muscle spasms. (Patient not taking: Reported on 04/24/2020) 60 tablet 0   No current facility-administered medications for this visit.    REVIEW OF SYSTEMS:   Constitutional: ( - ) fevers, ( - )  chills , ( - ) night sweats Eyes: ( - )  blurriness of vision, ( - ) double vision, ( - ) watery eyes Ears, nose, mouth, throat, and face: ( - ) mucositis, ( - ) sore throat Respiratory: ( - ) cough, ( - ) dyspnea, ( - ) wheezes Cardiovascular: ( - ) palpitation, ( - ) chest discomfort, ( - ) lower extremity swelling Gastrointestinal:  ( - ) nausea, ( - ) heartburn, ( - ) change in bowel habits Skin: ( - ) abnormal skin rashes Lymphatics: ( - ) new lymphadenopathy, ( - ) easy bruising Neurological: ( - ) numbness, ( - ) tingling, ( - ) new weaknesses Behavioral/Psych: ( - ) mood change, ( - ) new changes  All other systems were reviewed with the patient and are negative.  PHYSICAL EXAMINATION: ECOG PERFORMANCE STATUS: 0 - Asymptomatic  Vitals:   04/24/20 0816  BP: (!) 149/70  Pulse: 61  Resp: 20  Temp: 97.9 F (36.6 C)  SpO2: 100%   Filed Weights   04/24/20 0816  Weight: 182 lb 12.8 oz (82.9 kg)    Connor: well appearing elderly Caucasian male in NAD. In good physical condition, athletic appearing.  SKIN: skin color, texture, turgor are normal, no rashes or significant lesions EYES: conjunctiva are pink and non-injected, sclera clear LYMPH:  no palpable lymphadenopathy in the cervical, axillary or suprclavicular LUNGS: clear to auscultation and percussion with normal breathing effort HEART: regular rate & rhythm and no murmurs and no lower extremity edema ABDOMEN: soft, non-tender, non-distended, normal bowel sounds. Left upper quadrant mass with mild tenderness to palpation.  Musculoskeletal: no cyanosis of digits and no clubbing  PSYCH: alert & oriented x 3, fluent speech NEURO: no focal motor/sensory deficits  LABORATORY DATA:  I have reviewed the data as listed CBC Latest Ref Rng & Units  12/15/2016 12/14/2016 12/03/2016  WBC 4.0 - 10.5 K/uL 9.6 9.3 5.5  Hemoglobin 13.0 - 17.0 g/dL 12.1(L) 12.5(L) 15.1  Hematocrit 39 - 52 % 37.0(L) 38.3(L) 45.7  Platelets 150 - 400 K/uL 136(L) 154 177    CMP Latest Ref Rng &  Units 12/14/2016 12/03/2016 10/17/2015  Glucose 65 - 99 mg/dL 138(H) 110(H) 107(H)  BUN 6 - 20 mg/dL 15 21(H) 22  Creatinine 0.61 - 1.24 mg/dL 1.31(H) 1.35(H) 1.26  Sodium 135 - 145 mmol/L 136 142 144  Potassium 3.5 - 5.1 mmol/L 4.0 4.2 4.5  Chloride 101 - 111 mmol/L 100(L) 107 102  CO2 22 - 32 mmol/L 27 28 24   Calcium 8.9 - 10.3 mg/dL 8.6(L) 9.6 9.7  Total Protein 6.0 - 8.3 g/dL - - -  Total Bilirubin 0.3 - 1.2 mg/dL - - -  Alkaline Phos 39 - 117 U/L - - -  AST 0 - 37 U/L - - -  ALT 0 - 53 U/L - - -    PATHOLOGY: Biopsy pending the results of the PET CT scan.   BLOOD FILM:  Review of the peripheral blood smear showed normal appearing white cells with neutrophils that were appropriately lobated and granulated. There was no predominance of bi-lobed or hyper-segmented neutrophils appreciated. No Dohle bodies were noted. There was no left shifting, immature forms or blasts noted. Lymphocytes remain normal in size without any predominance of large granular lymphocytes. Red cells show no anisopoikilocytosis, macrocytes , microcytes or polychromasia. There were no schistocytes, target cells, echinocytes, acanthocytes, dacrocytes, or stomatocytes.There was no rouleaux formation, nucleated red cells, or intra-cellular inclusions noted. The platelets are normal in size, shape, and color without any clumping evident.   RADIOGRAPHIC STUDIES: I have personally reviewed the radiological images as listed and agreed with the findings in the report: large left upper quadrant mass. No overt lymphadenopathy elsewhere.   CT ABDOMEN PELVIS W CONTRAST  Result Date: 04/17/2020 CLINICAL DATA:  Left upper abdominal discomfort. Possible hernia. Heartburn. History of melanoma. EXAM: CT ABDOMEN AND PELVIS WITH CONTRAST TECHNIQUE: Multidetector CT imaging of the abdomen and pelvis was performed using the standard protocol following bolus administration of intravenous contrast. CONTRAST:  124mL ISOVUE-300 IOPAMIDOL  (ISOVUE-300) INJECTION 61% COMPARISON:  12/19/2009 limited abdominal ultrasound. No comparison CT. FINDINGS: Lower chest: Multiple left lower lobe pleural-based pulmonary nodules, including at 1.0 cm on 13/4 and 7 mm on 12/04. Normal heart size without pericardial or pleural effusion. Normal heart size without pericardial or pleural effusion. Distal right coronary artery atherosclerosis. Hepatobiliary: Normal liver. Stone within the gallbladder fundus of 6 mm. No acute cholecystitis or biliary duct dilatation. Pancreas: Normal, without mass or ductal dilatation. Spleen: Normal in size, without focal abnormality. Adrenals/Urinary Tract: Normal right adrenal gland. Mild left adrenal nodularity. Multiple bilateral renal collecting system calculi, on the order of 4 mm and less. Right renal too small to characterize lesions. An exophytic anterior interpolar left renal 1.4 cm lesion measures greater than fluid density, including on 34/2 and 24/11. Normal urinary bladder. Stomach/Bowel: Normal stomach, without wall thickening. Periampullary duodenal diverticulum. No small bowel obstruction. Normal terminal ileum and appendix. Apparent soft tissue fullness at and just cephalad the ileocecal valve, within the ascending colon on 47/2 on coronal image 68. Vascular/Lymphatic: Aortic atherosclerosis. Prominent but not pathologically sized abdominal retroperitoneal nodes. Example left periaortic 9 mm node on 37/2. A dominant nodal mass within the jejunal mesentery measures 11.5 x 10.9 cm on 32/2. Smaller surrounding nodes, including at 7 mm on 26/2.  No pelvic sidewall adenopathy. Reproductive: Mild prostatomegaly. Other: Trace free pelvic fluid. Tiny fat containing right inguinal hernia. No free intraperitoneal air. Musculoskeletal: Bilateral L5 pars defects. Advanced degenerate disc disease at this level with grade 2 L5-S1 anterolisthesis. IMPRESSION: 1. Dominant nodal mass within the jejunal mesentery, highly suspicious for  lymphoma. Given the clinical history of melanoma, metastatic disease is possible but felt less likely. 2. Left lower lobe pleural-based pulmonary nodules, for which metastatic disease or lymphoma is occluded. Consider complete staging with chest CT or PET. 3. Apparent soft tissue fullness at and just cephalad the ileocecal valve. Cannot exclude primary colonic carcinoma. Correlate with colon cancer screening history and possibly optical colonoscopy. 4. Coronary artery atherosclerosis. Aortic Atherosclerosis (ICD10-I70.0). 5. Cholelithiasis. 6. Bilateral nephrolithiasis. 7. Exophytic left renal lesion which measures greater than fluid density. Complex cyst or solid neoplasm. This could be re-evaluated at follow-up or more entirely characterized with dedicated pre and post contrast abdominal MRI. These results will be called to the ordering clinician or representative by the Radiologist Assistant, and communication documented in the PACS or [employer]. Electronically Signed   By: Abigail Miyamoto M.D.   On: 04/17/2020 11:44    ASSESSMENT & PLAN GERALD Adams 74 y.o. 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  After review the labs, review the imaging, discussion with the patient the findings are most consistent with a neoplastic abdominal mass.  The differential for this mass is broad but includes lymphoma, melanoma, primary colon cancer, prostate cancer, or alternative etiology.  It is really difficult to tell based off the imaging alone what the etiology is.    I am in agreement with a PET CT scan to be performed on Monday as well as GI evaluation next week.  Based on the results of this PET CT scan we can determine how we best to obtain tissue for diagnosis.  In the event there is found to be PET avid lesions within the ileocecal valve then biopsy via colonoscopy could be performed.  If there is no  evidence of disease at that site I would consider an IR CT-guided biopsy directly of abdominal mass itself.  In the interim we will order CMP, CBC, LDH, and uric acid for baseline.  In order to help assist with diagnosis we will review blood film, flow cytometry and order PSA as well.  We will have the patient return in approximately 3 weeks time once we have final tissue diagnosis in order to discuss the plan moving forward.  # Lymphadenopathy, concerning for Lymphoma #Soft tissue colonic mass --PET CT scan ordered for 04/28/2020 and patient set to meet with gastroenterology next week.  --pending the results of this scan will determine the best site for biopsy, be it via colonoscopy or IR guided of the abdominal mass.  --today will order baseline bloodwork to include CMP, LDH, CBC, uric acid. --additional evaluation with blood film, flow cytometry, and PSA --will schedule placeholder visit in approximately 3 weeks time, assuming we have final biopsy pathology at that time.   No orders of the defined types were placed in this encounter.   All questions were answered. The patient knows to call the clinic with any problems, questions or concerns.  A total of more than 60 minutes were spent on this encounter and over half of that time was spent on counseling and coordination of care as outlined above.  [name], MD Department of Hematology/Oncology [place] at [facility] Phone: [phone] Pager: [phone] Email: [email or web address]  04/24/2020 8:29 AM

## 2020-04-25 ENCOUNTER — Telehealth: Payer: Self-pay | Admitting: Hematology and Oncology

## 2020-04-25 LAB — SURGICAL PATHOLOGY

## 2020-04-25 LAB — PROSTATE-SPECIFIC AG, SERUM (LABCORP): Prostate Specific Ag, Serum: 3.6 ng/mL (ref 0.0–4.0)

## 2020-04-25 NOTE — Telephone Encounter (Signed)
Scheduled per los. Called and spoke with patients wife. Confirmed appt  

## 2020-04-28 ENCOUNTER — Other Ambulatory Visit: Payer: Self-pay

## 2020-04-28 ENCOUNTER — Ambulatory Visit (HOSPITAL_COMMUNITY)
Admission: RE | Admit: 2020-04-28 | Discharge: 2020-04-28 | Disposition: A | Discharge status: Home / Self Care | Payer: Medicare PPO | Source: Ambulatory Visit | Attending: Family Medicine | Admitting: Family Medicine

## 2020-04-28 DIAGNOSIS — R599 Enlarged lymph nodes, unspecified: Secondary | ICD-10-CM | POA: Diagnosis not present

## 2020-04-28 DIAGNOSIS — R918 Other nonspecific abnormal finding of lung field: Secondary | ICD-10-CM | POA: Insufficient documentation

## 2020-04-28 DIAGNOSIS — N200 Calculus of kidney: Secondary | ICD-10-CM | POA: Insufficient documentation

## 2020-04-28 DIAGNOSIS — N289 Disorder of kidney and ureter, unspecified: Secondary | ICD-10-CM | POA: Insufficient documentation

## 2020-04-28 DIAGNOSIS — M48061 Spinal stenosis, lumbar region without neurogenic claudication: Secondary | ICD-10-CM | POA: Insufficient documentation

## 2020-04-28 DIAGNOSIS — N400 Enlarged prostate without lower urinary tract symptoms: Secondary | ICD-10-CM | POA: Insufficient documentation

## 2020-04-28 DIAGNOSIS — R188 Other ascites: Secondary | ICD-10-CM | POA: Diagnosis not present

## 2020-04-28 DIAGNOSIS — K6389 Other specified diseases of intestine: Secondary | ICD-10-CM | POA: Diagnosis not present

## 2020-04-28 DIAGNOSIS — Z85820 Personal history of malignant melanoma of skin: Secondary | ICD-10-CM | POA: Diagnosis not present

## 2020-04-28 DIAGNOSIS — I2510 Atherosclerotic heart disease of native coronary artery without angina pectoris: Secondary | ICD-10-CM | POA: Diagnosis not present

## 2020-04-28 LAB — GLUCOSE, CAPILLARY: Glucose-Capillary: 109 mg/dL — ABNORMAL HIGH (ref 70–99)

## 2020-04-28 LAB — FLOW CYTOMETRY

## 2020-04-28 MED ORDER — FLUDEOXYGLUCOSE F - 18 (FDG) INJECTION
9.1000 | Freq: Once | INTRAVENOUS | Status: AC | PRN
  Administered 2020-04-28: 9.1 via INTRAVENOUS

## 2020-04-29 ENCOUNTER — Telehealth: Payer: Self-pay | Admitting: Hematology and Oncology

## 2020-04-29 ENCOUNTER — Telehealth: Payer: Self-pay

## 2020-04-29 DIAGNOSIS — R1901 Right upper quadrant abdominal swelling, mass and lump: Secondary | ICD-10-CM

## 2020-04-29 DIAGNOSIS — R935 Abnormal findings on diagnostic imaging of other abdominal regions, including retroperitoneum: Secondary | ICD-10-CM | POA: Diagnosis not present

## 2020-04-29 NOTE — Telephone Encounter (Signed)
Received call from Dr. Paulita Fujita requesting to speak with Dr.Dorsey concerning this pt

## 2020-04-29 NOTE — Telephone Encounter (Signed)
Called Mr. Payson to discuss the results of his PET CT scan.  Findings are most consistent with a Deauville 5 hypermetabolic lesion that is 13 cm in diameter.  There are some hypermetabolic porta hepatis and retroperitoneal lymph nodes, but no other easy to access areas and no areas that appear to be amenable to excisional biopsy.  As such I would recommend core biopsies performed with interventional radiology of this largest mass in order to help confirm diagnosis.  We have a standby appointment ready for this patient on 05/15/2020 my hope is that we will have tissue by this time in order to discuss treatment moving forward.  We would be happy to see the patient earlier if we have results before that time.  [name], MD Department of Hematology/Oncology [place] at [facility] Phone: [phone] Pager: [phone] Email: [email or web address]

## 2020-04-30 ENCOUNTER — Encounter (HOSPITAL_COMMUNITY): Payer: Self-pay | Admitting: Radiology

## 2020-04-30 NOTE — Progress Notes (Signed)
Connor Adams Male, 74 y.o., 21-Sep-1945 MRN:  [id number] Phone:  [phone] (H) PCP:  Shirline Frees, MD Coverage:  [facility] Medicare/[employer] Medicare Choice Ppo Next Appt With Oncology 05/15/2020 at 10:00 AM  RE: CT Biopsy Received: Today Arne Cleveland, MD  Jillyn [place]   CT core L abd mass  Lymphoma v GIST/sarcoma/other   DDH       Previous Messages   ----- Message -----  From: Garth Bigness D  Sent: 04/29/2020  3:47 PM EDT  To: Ir Procedure Requests  Subject: CT Biopsy                     Procedure:  CT Biopsy   Reason: Right upper quadrant abdominal mass, large abdominal mass concerning for lymphoma   History:  CT, NM PET in computer   Provider: [name] IV   Provider Contact: [phone]

## 2020-05-07 ENCOUNTER — Other Ambulatory Visit: Payer: Self-pay | Admitting: Student

## 2020-05-08 ENCOUNTER — Other Ambulatory Visit: Payer: Self-pay

## 2020-05-08 ENCOUNTER — Ambulatory Visit (HOSPITAL_COMMUNITY)
Admission: RE | Admit: 2020-05-08 | Discharge: 2020-05-08 | Disposition: A | Discharge status: Home / Self Care | Payer: Medicare PPO | Source: Ambulatory Visit | Attending: Hematology and Oncology | Admitting: Hematology and Oncology

## 2020-05-08 DIAGNOSIS — C8513 Unspecified B-cell lymphoma, intra-abdominal lymph nodes: Secondary | ICD-10-CM | POA: Diagnosis not present

## 2020-05-08 DIAGNOSIS — R1909 Other intra-abdominal and pelvic swelling, mass and lump: Secondary | ICD-10-CM | POA: Diagnosis not present

## 2020-05-08 DIAGNOSIS — C8590 Non-Hodgkin lymphoma, unspecified, unspecified site: Secondary | ICD-10-CM | POA: Diagnosis not present

## 2020-05-08 DIAGNOSIS — K6389 Other specified diseases of intestine: Secondary | ICD-10-CM | POA: Insufficient documentation

## 2020-05-08 DIAGNOSIS — R1901 Right upper quadrant abdominal swelling, mass and lump: Secondary | ICD-10-CM

## 2020-05-08 LAB — CBC
HCT: 39.2 % (ref 39.0–52.0)
Hemoglobin: 12.5 g/dL — ABNORMAL LOW (ref 13.0–17.0)
MCH: 29.5 pg (ref 26.0–34.0)
MCHC: 31.9 g/dL (ref 30.0–36.0)
MCV: 92.5 fL (ref 80.0–100.0)
Platelets: 297 10*3/uL (ref 150–400)
RBC: 4.24 MIL/uL (ref 4.22–5.81)
RDW: 12.4 % (ref 11.5–15.5)
WBC: 7.6 10*3/uL (ref 4.0–10.5)
nRBC: 0 % (ref 0.0–0.2)

## 2020-05-08 LAB — PROTIME-INR
INR: 1.1 (ref 0.8–1.2)
Prothrombin Time: 13.4 seconds (ref 11.4–15.2)

## 2020-05-08 MED ORDER — MIDAZOLAM HCL 2 MG/2ML IJ SOLN
INTRAMUSCULAR | Status: AC | PRN
  Administered 2020-05-08: 0.5 mg via INTRAVENOUS
  Administered 2020-05-08: 1 mg via INTRAVENOUS

## 2020-05-08 MED ORDER — LIDOCAINE HCL 1 % IJ SOLN
INTRAMUSCULAR | Status: AC
  Administered 2020-05-08: 20 mL
  Filled 2020-05-08: qty 20

## 2020-05-08 MED ORDER — MIDAZOLAM HCL 2 MG/2ML IJ SOLN
INTRAMUSCULAR | Status: AC
  Filled 2020-05-08: qty 2

## 2020-05-08 MED ORDER — GELATIN ABSORBABLE 12-7 MM EX MISC
CUTANEOUS | Status: AC
  Filled 2020-05-08: qty 1

## 2020-05-08 MED ORDER — FENTANYL CITRATE (PF) 100 MCG/2ML IJ SOLN
INTRAMUSCULAR | Status: AC | PRN
Start: 2020-05-08 — End: 2020-05-08
  Administered 2020-05-08: 50 ug via INTRAVENOUS
  Administered 2020-05-08: 25 ug via INTRAVENOUS

## 2020-05-08 MED ORDER — FENTANYL CITRATE (PF) 100 MCG/2ML IJ SOLN
INTRAMUSCULAR | Status: AC
  Filled 2020-05-08: qty 2

## 2020-05-08 MED ORDER — SODIUM CHLORIDE 0.9 % IV SOLN: INTRAVENOUS | Status: DC

## 2020-05-08 NOTE — Discharge Instructions (Signed)

## 2020-05-08 NOTE — H&P (Signed)
Chief Complaint: Patient was seen in consultation today for image-guided biopsy of abdominal mass.   Referring Physician(s): Dorsey,John T IV  Supervising Physician: Aletta Edouard  Patient Status: [facility] - Out-pt  History of Present Illness: Connor KUPPER is a 74 y.o. male with a medical history that includes TIA, melanoma in situ, HTN and CKD III. He presented to his PCP for ongoing abdominal pain and worsening heartburn. A mass was palpated in his abdomen and imaging was ordered.   CT abdomen/pelvis w/contrast 04/17/20: IMPRESSION: 1. Dominant nodal mass within the jejunal mesentery, highly suspicious for lymphoma. Given the clinical history of melanoma, metastatic disease is possible but felt less likely. 2. Left lower lobe pleural-based pulmonary nodules, for which metastatic disease or lymphoma is occluded. Consider complete staging with chest CT or PET. 3. Apparent soft tissue fullness at and just cephalad the ileocecal valve. Cannot exclude primary colonic carcinoma. Correlate with colon cancer screening history and possibly optical colonoscopy. 4. Coronary artery atherosclerosis. Aortic Atherosclerosis (ICD10-I70.0). 5. Cholelithiasis. 6. Bilateral nephrolithiasis. 7. Exophytic left renal lesion which measures greater than fluid density. Complex cyst or solid neoplasm. This could be re-evaluated at follow-up or more entirely characterized with dedicated pre and post contrast abdominal MRI.  PET Scan 04/28/20: IMPRESSION: 1. Large solid left mesenteric mass 13.0 cm with maximum SUV of 21.2, Deauville 5. 2. Hypermetabolic porta hepatis and retroperitoneal lymph nodes along with small but hypermetabolic juxtadiaphragmatic/pericardial lymph nodes in the lower chest, generally Deauville 4 and Deauville 5. 3. Branching nodularity in the left Adams lobe, Deauville 2 activity, probably from atypical infectious bronchiolitis. 4. Subpleural plaque/nodularity along the  left hemidiaphragm with Deauville 3 activity. 5. Accentuated activity at the pharyngoesophageal junction, most likely physiologic. 6. Other imaging findings of potential clinical significance: Aortic Atherosclerosis (ICD10-I70.0). Coronary atherosclerosis. 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 Bilateral nonobstructive nephrolithiasis. Small amount of pelvic ascites. Mild prostatomegaly.  Interventional Radiology has been asked to evaluate this patient for an image-guided biopsy of an abdominal mass. This case has been reviewed and procedure approved by Dr. Vernard Gambles.   Past Medical History:  Diagnosis Date  . Arthritis   . Chronic kidney disease (CKD), stage III (moderate)   . GERD (gastroesophageal reflux disease)    occ  . Hemorrhoids   . History of ETT 3/05   low risk  . History of hiatal hernia   . History of kidney stones   . HLD (hyperlipidemia)   . HTN (hypertension)   . Left inguinal hernia   . Melanoma ([place])    excied  . 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 Stroke [facility]) 2011   tia   . TIA (transient ischemic attack) 4/11   associated w slurred speecha ndmild facial droop. lasted for about 10 minutes. Had MRI, etc with [facility] that per his report was ok (dr. Stephannie Li). Echo (4/11): EF 55-60%, no regional WMAs, normal diastolic function, mild LAE, no source of embolus    Past Surgical History:  Procedure Laterality Date  . BACK SURGERY    . BMI 30.2 muscular  12/08/04  . curretage actinic keratosis R ear  01/20/05  . ETT low prob of CAD  11/21/03  . excision melanoma in situ back  01/20/05  . hemorrhoids sclerosed at colonoscopy  12/19/05  . LAPAROSCOPIC INGUINAL HERNIA REPAIR  12/20/95  . retinal laser surgery  09/20/86  . SEPTOPLASTY  11/19/98  .  TOTAL KNEE ARTHROPLASTY Left 12/13/2016   Procedure: TOTAL KNEE ARTHROPLASTY WITH RIGHT KNEE CORTISONE INJECTION;   Surgeon: Frederik Pear, MD;  Location: [place];  Service: Orthopedics;  Laterality: Left;  . uric acid 6.9  12/22/04  . x-ray l great toe  07/21/00    Allergies: Patient has no known allergies.  Medications: Prior to Admission medications   Medication Sig Start Date End Date Taking? Authorizing Provider  ARTIFICIAL TEAR OP Apply 1 drop to eye daily as needed (dry eyes).   Yes [provider]  aspirin 325 MG tablet Take 1 tablet (325 mg total) by mouth 2 (two) times daily. Patient taking differently: Take 162.5 mg by mouth 2 (two) times daily.  12/13/16  Yes Joanell Rising K, PA-C  fluticasone (FLONASE) 50 MCG/ACT nasal spray Place 1 spray into both nostrils as needed for allergies.  09/16/15  Yes [provider]  hydrocortisone (PROCTOSOL HC) 2.5 % rectal cream Place 1 application rectally daily as needed for hemorrhoids or itching.   Yes [provider]  ibuprofen (ADVIL,MOTRIN) 200 MG tablet Take 400 mg by mouth daily as needed for headache or moderate pain. For  pain   Yes [provider]  indomethacin (INDOCIN) 50 MG capsule Take 50 mg by mouth 3 (three) times daily as needed (gout flare).  10/27/18  Yes [provider]  loratadine (CLARITIN) 10 MG tablet Take 10 mg by mouth daily as needed for allergies.   Yes [provider]  losartan (COZAAR) 25 MG tablet Take 25 mg by mouth daily.  04/09/20  Yes [provider]  Multiple Vitamins-Minerals (MULTIVITAMINS THER. W/MINERALS) TABS Take 1 tablet by mouth daily.     Yes [provider]  Omega-3 Fatty Acids (OMEGA 3 PO) Take 600 mg by mouth daily.   Yes [provider]  pantoprazole (PROTONIX) 40 MG tablet Take 40 mg by mouth daily.  04/09/20  Yes [provider]  PROAIR HFA 108 (90 Base) MCG/ACT inhaler Inhale 2 puffs into the lungs as needed for wheezing or shortness of breath.  09/16/15  Yes [provider]  pseudoephedrine (SUDAFED) 30 MG tablet Take 30  mg by mouth every 4 (four) hours as needed for congestion.   Yes [provider]  rosuvastatin (CRESTOR) 5 MG tablet Take 5 mg by mouth every evening.    Yes [provider]     Family History  Problem Relation Age of Onset  . Heart failure Brother 44       CABGx4  . Heart attack Mother 31  . Heart attack Father 62  . Asthma Son   . Diabetes Brother     Social History   Socioeconomic History  . Marital status: Married    Spouse name: [name]  . Number of children: 2  . Years of education: 73  . Highest education level: Not on file  Occupational History  . Occupation: Systems developer: [employer]  Tobacco Use  . Smoking status: Never Smoker  . Smokeless tobacco: Never Used  Substance and Sexual Activity  . Alcohol use: No  . Drug use: No  . Sexual activity: Not on file  Other Topics Concern  . Not on file  Social History Narrative   Married to [employer] at [employer].   Son, [name] married   Son, [name], Married.    Caffeine use: 1 cup coffee per day   Social Determinants of Health  Financial Resource Strain:   . Difficulty of Paying Living Expenses: Not on file  Food Insecurity:   . Worried About Charity fundraiser in the Last Year: Not on file  . Ran Out of Food in the Last Year: Not on file  Transportation Needs:   . Lack of Transportation (Medical): Not on file  . Lack of Transportation (Non-Medical): Not on file  Physical Activity:   . Days of Exercise per Week: Not on file  . Minutes of Exercise per Session: Not on file  Stress:   . Feeling of Stress : Not on file  Social Connections:   . Frequency of Communication with Friends and Family: Not on file  . Frequency of Social Gatherings with Friends and Family: Not on file  . Attends Religious Services: Not on file  . Active Member of Clubs or Organizations: Not on file  . Attends Archivist Meetings: Not on file  . Marital Status: Not on  file    Review of Systems: A 12 point ROS discussed and pertinent positives are indicated in the HPI above.  All other systems are negative.  Review of Systems  Constitutional: Negative for appetite change and fatigue.  Respiratory: Negative for cough and shortness of breath.   Cardiovascular: Negative for chest pain and leg swelling.  Gastrointestinal: Positive for abdominal pain. Negative for diarrhea, nausea and vomiting.  Musculoskeletal: Negative for back pain.  Neurological: Negative for headaches.    Vital Signs: BP (!) 160/77   Pulse (!) 56   Temp 98.4 F (36.9 C) (Oral)   Resp 18   Ht 5\' 11"  (1.803 m)   Wt 185 lb (83.9 kg)   SpO2 99%   BMI 25.80 kg/m   Physical Exam Constitutional:      General: He is not in acute distress. HENT:     Mouth/Throat:     Mouth: Mucous membranes are moist.     Pharynx: Oropharynx is clear.  Cardiovascular:     Rate and Rhythm: Normal rate and regular rhythm.     Pulses: Normal pulses.     Heart sounds: Normal heart sounds.  Pulmonary:     Effort: Pulmonary effort is normal.     Breath sounds: Normal breath sounds.  Abdominal:     General: Bowel sounds are normal.     Palpations: Abdomen is soft.     Tenderness: There is abdominal tenderness.     Comments: LUQ tenderness   Musculoskeletal:        General: Normal range of motion.     Cervical back: Normal range of motion.  Skin:    General: Skin is warm and dry.  Neurological:     Mental Status: He is alert and oriented to person, place, and time.     Imaging: CT ABDOMEN PELVIS W CONTRAST  Result Date: 04/17/2020 CLINICAL DATA:  Left Adams abdominal discomfort. Possible hernia. Heartburn. History of melanoma. EXAM: CT ABDOMEN AND PELVIS WITH CONTRAST TECHNIQUE: Multidetector CT imaging of the abdomen and pelvis was performed using the standard protocol following bolus administration of intravenous contrast. CONTRAST:  16mL ISOVUE-300 IOPAMIDOL (ISOVUE-300) INJECTION 61%  COMPARISON:  12/19/2009 limited abdominal ultrasound. No comparison CT. FINDINGS: Lower chest: Multiple left lower lobe pleural-based pulmonary nodules, including at 1.0 cm on 13/4 and 7 mm on 12/04. Normal heart size without pericardial or pleural effusion. Normal heart size without pericardial or pleural effusion. Distal right coronary artery atherosclerosis. Hepatobiliary: Normal liver. Stone within the gallbladder fundus  of 6 mm. No acute cholecystitis or biliary duct dilatation. Pancreas: Normal, without mass or ductal dilatation. Spleen: Normal in size, without focal abnormality. Adrenals/Urinary Tract: Normal right adrenal gland. Mild left adrenal nodularity. Multiple bilateral renal collecting system calculi, on the order of 4 mm and less. Right renal too small to characterize lesions. An exophytic anterior interpolar left renal 1.4 cm lesion measures greater than fluid density, including on 34/2 and 24/11. Normal urinary bladder. Stomach/Bowel: Normal stomach, without wall thickening. Periampullary duodenal diverticulum. No small bowel obstruction. Normal terminal ileum and appendix. Apparent soft tissue fullness at and just cephalad the ileocecal valve, within the ascending colon on 47/2 on coronal image 68. Vascular/Lymphatic: Aortic atherosclerosis. Prominent but not pathologically sized abdominal retroperitoneal nodes. Example left periaortic 9 mm node on 37/2. A dominant nodal mass within the jejunal mesentery measures 11.5 x 10.9 cm on 32/2. Smaller surrounding nodes, including at 7 mm on 26/2. No pelvic sidewall adenopathy. Reproductive: Mild prostatomegaly. Other: Trace free pelvic fluid. Tiny fat containing right inguinal hernia. No free intraperitoneal air. Musculoskeletal: Bilateral L5 pars defects. Advanced degenerate disc disease at this level with grade 2 L5-S1 anterolisthesis. IMPRESSION: 1. Dominant nodal mass within the jejunal mesentery, highly suspicious for lymphoma. Given the clinical  history of melanoma, metastatic disease is possible but felt less likely. 2. Left lower lobe pleural-based pulmonary nodules, for which metastatic disease or lymphoma is occluded. Consider complete staging with chest CT or PET. 3. Apparent soft tissue fullness at and just cephalad the ileocecal valve. Cannot exclude primary colonic carcinoma. Correlate with colon cancer screening history and possibly optical colonoscopy. 4. Coronary artery atherosclerosis. Aortic Atherosclerosis (ICD10-I70.0). 5. Cholelithiasis. 6. Bilateral nephrolithiasis. 7. Exophytic left renal lesion which measures greater than fluid density. Complex cyst or solid neoplasm. This could be re-evaluated at follow-up or more entirely characterized with dedicated pre and post contrast abdominal MRI. These results will be called to the ordering clinician or representative by the Radiologist Assistant, and communication documented in the PACS or [employer]. Electronically Signed   By: Abigail Miyamoto M.D.   On: 04/17/2020 11:44   NM PET Image Initial (PI) Skull Base To Thigh  Result Date: 04/28/2020 CLINICAL DATA:  Initial treatment strategy for mesenteric nodal mass. Reported history of melanoma. EXAM: NUCLEAR MEDICINE PET SKULL BASE TO THIGH TECHNIQUE: 9.1 mCi F-18 FDG was injected intravenously. Full-ring PET imaging was performed from the skull base to thigh after the radiotracer. CT data was obtained and used for attenuation correction and anatomic localization. Fasting blood glucose: 109 mg/dl COMPARISON:  CT abdomen 04/17/2020 FINDINGS: Mediastinal blood pool activity: SUV max 2.1 Liver activity: SUV max 2.6 NECK: Symmetric activity in the vicinity of the pharyngoesophageal junction, maximum SUV 13.0. Symmetric glottic activity. This regional activity is likely physiologic. Incidental CT findings: none CHEST: Hypermetabolic lymph node in the right pericardial adipose tissue adjacent to the diaphragm, 0.6 cm in short axis on image 101/4,  maximum SUV 7.4, Deauville 5. Activity along the inferior margin of the right first costosternal joint probably related to arthropathy, less likely a small internal mammary lymph node, maximum SUV 3.4, Deauville 4. Left basilar juxta diaphragmatic lymph node along the pleural adipose tissue with maximum SUV 5.1, Deauville 4. Subtle nodularity along the minor fissure about 0.3 cm in diameter without accentuated metabolic activity. Clustered/branching nodularity peripherally in the left Adams lobe with individual nodules up to 0.5 cm in diameter on image 25/8, maximum SUV 1.1. The branching pattern tends to favor atypical infectious bronchiolitis.  Subpleural plaque or nodularity along the left hemidiaphragm 0.5 cm, maximum SUV 2.3 (Deauville 3). Additional adjacent mild pleural-based nodularity as on the prior exam. Incidental CT findings: Coronary, aortic arch, and branch vessel atherosclerotic vascular disease. Calcified right paratracheal lymph nodes compatible with old granulomatous disease. ABDOMEN/PELVIS: The 13.0 by 10.7 cm left mesenteric mass on image 128/4 has maximum SUV of 21.2, Deauville 5. Hypermetabolic porta hepatis and retroperitoneal adenopathy observed. A left periaortic lymph node measures 1.3 cm in short axis on image 142/4 (formerly 1.2 cm by my measurements) and has a maximum SUV of 18.9, Deauville 5. A right inguinal lymph node measuring 0.9 cm in short axis on image 192/4 has maximum SUV of 3.4, Deauville 4. There are some scattered additional small mesenteric lymph nodes with only low-grade activity. Mild mesenteric edema associated with the mesenteric mass. No splenomegaly or significant hypermetabolic splenic lesion is observed. Incidental CT findings: Aortoiliac atherosclerotic vascular disease. Bilateral nonobstructive nephrolithiasis. Retroperitoneal and mesenteric stranding primarily related to the large mesenteric mass. Small amount of pelvic ascites. Mild prostatomegaly. SKELETON: No  significant abnormal hypermetabolic activity in this region. 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 Cervical spine plate and screw fixator. IMPRESSION: 1. Large solid left mesenteric mass 13.0 cm with maximum SUV of 21.2, Deauville 5. 2. Hypermetabolic porta hepatis and retroperitoneal lymph nodes along with small but hypermetabolic juxtadiaphragmatic/pericardial lymph nodes in the lower chest, generally Deauville 4 and Deauville 5. 3. Branching nodularity in the left Adams lobe, Deauville 2 activity, probably from atypical infectious bronchiolitis. 4. Subpleural plaque/nodularity along the left hemidiaphragm with Deauville 3 activity. 5. Accentuated activity at the pharyngoesophageal junction, most likely physiologic. 6. Other imaging findings of potential clinical significance: Aortic Atherosclerosis (ICD10-I70.0). Coronary atherosclerosis. 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 Bilateral nonobstructive nephrolithiasis. Small amount of pelvic ascites. Mild prostatomegaly. Electronically Signed   By: Van Clines M.D.   On: 04/28/2020 11:35    Labs:  CBC: Recent Labs    04/24/20 0928 05/08/20 0932  WBC 6.6 7.6  HGB 13.2 12.5*  HCT 40.8 39.2  PLT 248 297    COAGS: No results for input(s): INR, APTT in the last 8760 hours.  BMP: Recent Labs    04/24/20 0928  NA 142  K 4.3  CL 105  CO2 27  GLUCOSE 99  BUN 20  CALCIUM 10.3  CREATININE 1.28*  GFRNONAA 55*  GFRAA >60    LIVER FUNCTION TESTS: Recent Labs    04/24/20 0928  BILITOT 0.5  AST 20  ALT 15  ALKPHOS 70  PROT 7.7  ALBUMIN 3.7    TUMOR MARKERS: No results for input(s): AFPTM, CEA, CA199, CHROMGRNA in the last 8760 hours.  Assessment and Plan:  Mesenteric mass; lymphadenopathy: Ellwood Sayers,  74 year old male, presents today to the [facility] Interventional Radiology department for an image-guided abdominal mass biopsy.   Risks and benefits of an abdominal mass biopsy were discussed with the patient and/or patient's family including, but not limited to bleeding, infection, damage to adjacent structures or low yield requiring additional tests.  All of the questions were answered and there is agreement to proceed.  The patient has been NPO. Labs and vitals have been reviewed. The patient does not take any blood thinning medications.   Consent signed and in chart.  Thank you for this interesting  consult.  I greatly enjoyed meeting KYLEE NARDOZZI and look forward to participating in their care.  A copy of this report was sent to the requesting provider on this date.  Electronically Signed: Soyla Dryer, AGACNP-BC [phone] 05/08/2020, 10:27 AM   I spent a total of  30 Minutes   in face to face in clinical consultation, greater than 50% of which was counseling/coordinating care for image-guided abdominal mass biopsy.

## 2020-05-08 NOTE — Procedures (Signed)
Interventional Radiology Procedure Note  Procedure: CT Guided Biopsy of mesenteric mass  Complications: None  Estimated Blood Loss: < 10 mL  Findings: 49 G core biopsy of left sided mesenteric mass performed under CT guidance.  Four core samples obtained and sent to Pathology.  Venetia Night. Kathlene Cote, M.D Pager:  [phone]

## 2020-05-12 ENCOUNTER — Encounter: Payer: Self-pay | Admitting: Hematology and Oncology

## 2020-05-13 ENCOUNTER — Telehealth: Payer: Self-pay

## 2020-05-13 ENCOUNTER — Inpatient Hospital Stay: Payer: Medicare PPO

## 2020-05-13 ENCOUNTER — Other Ambulatory Visit: Payer: Self-pay

## 2020-05-13 ENCOUNTER — Inpatient Hospital Stay (HOSPITAL_BASED_OUTPATIENT_CLINIC_OR_DEPARTMENT_OTHER): Payer: Medicare PPO | Admitting: Medical

## 2020-05-13 VITALS — BP 136/70 | HR 68 | Temp 98.1°F | Resp 18 | Ht 71.0 in | Wt 176.6 lb

## 2020-05-13 DIAGNOSIS — R112 Nausea with vomiting, unspecified: Secondary | ICD-10-CM

## 2020-05-13 DIAGNOSIS — R591 Generalized enlarged lymph nodes: Secondary | ICD-10-CM | POA: Diagnosis not present

## 2020-05-13 DIAGNOSIS — I129 Hypertensive chronic kidney disease with stage 1 through stage 4 chronic kidney disease, or unspecified chronic kidney disease: Secondary | ICD-10-CM | POA: Diagnosis not present

## 2020-05-13 DIAGNOSIS — E785 Hyperlipidemia, unspecified: Secondary | ICD-10-CM | POA: Diagnosis not present

## 2020-05-13 DIAGNOSIS — R1901 Right upper quadrant abdominal swelling, mass and lump: Secondary | ICD-10-CM

## 2020-05-13 DIAGNOSIS — K920 Hematemesis: Secondary | ICD-10-CM

## 2020-05-13 DIAGNOSIS — C8333 Diffuse large B-cell lymphoma, intra-abdominal lymph nodes: Secondary | ICD-10-CM | POA: Diagnosis not present

## 2020-05-13 DIAGNOSIS — C8338 Diffuse large B-cell lymphoma, lymph nodes of multiple sites: Secondary | ICD-10-CM | POA: Diagnosis not present

## 2020-05-13 DIAGNOSIS — K219 Gastro-esophageal reflux disease without esophagitis: Secondary | ICD-10-CM | POA: Diagnosis not present

## 2020-05-13 DIAGNOSIS — Z85820 Personal history of malignant melanoma of skin: Secondary | ICD-10-CM | POA: Diagnosis not present

## 2020-05-13 DIAGNOSIS — I700 Atherosclerosis of aorta: Secondary | ICD-10-CM | POA: Diagnosis not present

## 2020-05-13 DIAGNOSIS — N183 Chronic kidney disease, stage 3 unspecified: Secondary | ICD-10-CM | POA: Diagnosis not present

## 2020-05-13 DIAGNOSIS — M1990 Unspecified osteoarthritis, unspecified site: Secondary | ICD-10-CM | POA: Diagnosis not present

## 2020-05-13 LAB — CMP (CANCER CENTER ONLY)
ALT: 21 U/L (ref 0–44)
AST: 30 U/L (ref 15–41)
Albumin: 3.3 g/dL — ABNORMAL LOW (ref 3.5–5.0)
Alkaline Phosphatase: 66 U/L (ref 38–126)
Anion gap: 11 (ref 5–15)
BUN: 28 mg/dL — ABNORMAL HIGH (ref 8–23)
CO2: 25 mmol/L (ref 22–32)
Calcium: 9.6 mg/dL (ref 8.9–10.3)
Chloride: 104 mmol/L (ref 98–111)
Creatinine: 1.2 mg/dL (ref 0.61–1.24)
GFR, Est AFR Am: >60 mL/min (ref >60)
GFR, Estimated: 60 mL/min — ABNORMAL LOW (ref >60)
Glucose, Bld: 105 mg/dL — ABNORMAL HIGH (ref 70–99)
Potassium: 4 mmol/L (ref 3.5–5.1)
Sodium: 140 mmol/L (ref 135–145)
Total Bilirubin: 0.5 mg/dL (ref 0.3–1.2)
Total Protein: 6.8 g/dL (ref 6.5–8.1)

## 2020-05-13 LAB — LACTIC ACID, PLASMA: Lactic Acid, Venous: 1 mmol/L (ref 0.5–1.9)

## 2020-05-13 LAB — CBC WITH DIFFERENTIAL (CANCER CENTER ONLY)
Abs Immature Granulocytes: 0.05 10*3/uL (ref 0.00–0.07)
Basophils Absolute: 0 10*3/uL (ref 0.0–0.1)
Basophils Relative: 0 %
Eosinophils Absolute: 0.1 10*3/uL (ref 0.0–0.5)
Eosinophils Relative: 1 %
HCT: 35.5 % — ABNORMAL LOW (ref 39.0–52.0)
Hemoglobin: 11.8 g/dL — ABNORMAL LOW (ref 13.0–17.0)
Immature Granulocytes: 0 %
Lymphocytes Relative: 6 %
Lymphs Abs: 0.7 10*3/uL (ref 0.7–4.0)
MCH: 30 pg (ref 26.0–34.0)
MCHC: 33.2 g/dL (ref 30.0–36.0)
MCV: 90.3 fL (ref 80.0–100.0)
Monocytes Absolute: 0.9 10*3/uL (ref 0.1–1.0)
Monocytes Relative: 8 %
Neutro Abs: 10.1 10*3/uL — ABNORMAL HIGH (ref 1.7–7.7)
Neutrophils Relative %: 85 %
Platelet Count: 305 10*3/uL (ref 150–400)
RBC: 3.93 MIL/uL — ABNORMAL LOW (ref 4.22–5.81)
RDW: 12.4 % (ref 11.5–15.5)
WBC Count: 11.9 10*3/uL — ABNORMAL HIGH (ref 4.0–10.5)
nRBC: 0 % (ref 0.0–0.2)

## 2020-05-13 LAB — SURGICAL PATHOLOGY

## 2020-05-13 LAB — LACTATE DEHYDROGENASE: LDH: 658 U/L — ABNORMAL HIGH (ref 98–192)

## 2020-05-13 MED ORDER — PROMETHAZINE HCL 25 MG PO TABS: 25.0000 mg | ORAL_TABLET | Freq: Four times a day (QID) | ORAL | 2 refills | Status: DC | PRN

## 2020-05-13 MED ORDER — PANTOPRAZOLE SODIUM 40 MG PO TBEC: 40.0000 mg | DELAYED_RELEASE_TABLET | Freq: Two times a day (BID) | ORAL | 5 refills | Status: DC

## 2020-05-13 MED ORDER — ONDANSETRON 8 MG PO TBDP: 8.0000 mg | ORAL_TABLET | Freq: Three times a day (TID) | ORAL | 5 refills | Status: DC | PRN

## 2020-05-13 NOTE — Telephone Encounter (Signed)
Received a call from Dr. Lorenso Courier that patient needs a visit is [facility] today. Called patient and he is scheduled for a 11:00 lab followed by a visit with V. Tanner, PA at 11:30. Patient verbalized understanding.

## 2020-05-14 ENCOUNTER — Other Ambulatory Visit: Payer: Self-pay | Admitting: Hematology and Oncology

## 2020-05-14 DIAGNOSIS — C8333 Diffuse large B-cell lymphoma, intra-abdominal lymph nodes: Secondary | ICD-10-CM

## 2020-05-14 NOTE — Progress Notes (Signed)
[facility] Progress Note   Connor Adams [id number] 08-Apr-1946 74 y.o.  Connor Adams is managed by Dr. 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  He is scheduled to be seen by Dr. Lorenso Courier in follow-up on 05/15/2020.  He has been referred for a port placement.  Hematemesis with nausea: The patient was given a prescription for Zofran 8 mg ODT, Phenergan 25 mg and Protonix 40 mg p.o. twice daily.  Dr. Lorenso Courier will be referring the patient to have an endoscopy completed.  Please see After Visit Summary for patient specific instructions.  Future Appointments  Date Time Provider [place]  05/15/2020 10:00 AM [facility] None  05/15/2020 10:30 AM [name] IV, MD [facility] None  05/19/2020  1:00 PM [facility] ROOM [facility] None  05/19/2020  2:30 PM [facility] 1 [facility] [place]    Orders Placed This Encounter  Procedures  . IR IMAGING GUIDED PORT INSERTION       Subjective:   Patient ID:  Connor Adams is a 74 y.o. (DOB [birth year]) male.  Chief Complaint: No chief complaint on file.   HPI Connor Adams  is a 74 y.o. male with a diagnosis of a diffuse large B-cell lymphoma.  He is awaiting a start of chemotherapy.  He presents to the clinic today with his wife.  He has had a 9 pound weight loss.  He is status post 2 percutaneous abdominal biopsy last Friday.  He  reports having nausea and vomiting that evening.  He reports having ongoing stomach pressure which is increases in severity after eating.  He reports that he has been belching.  He reports having a change in the smell of food since his COVID-19 infection.  He also noted change in the taste of food on Sunday and Monday.  He reports having a foul smell with certain foods.  This recurred on Sunday and Monday.  He had vomiting on Monday and noted 1 episode of bright red blood in his vomitus.  At that time he had approximately 1-1/2 cups of vomitus.  He ate a bagel on Monday morning.  He reports having water this morning.  He is having normal bowel movements.  His white count was elevated today.  His hemoglobin recently was 13.2 then 12.5 and returned at 11.8 today.  He continues to have abdominal pain in his left upper quadrant.  He reports that his energy level has been good until Monday and Tuesday of this week.  He reports that he has been taking Rolaids for his belching without good relief.   Medications: I have reviewed the patient's current medications.  Allergies: No Known Allergies  Past Medical History:  Diagnosis Date  . Arthritis   . Chronic kidney disease (CKD), stage III (moderate)   . GERD (gastroesophageal reflux disease)    occ  . Hemorrhoids   . History of ETT 3/05   low risk  . History of hiatal hernia   . History of kidney  stones   . HLD (hyperlipidemia)   . HTN (hypertension)   . Left inguinal hernia   . Melanoma ([place])    excied  . 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 Stroke [facility]) 2011   tia   . TIA (transient ischemic attack) 4/11   associated w slurred speecha ndmild facial droop. lasted for about 10 minutes. Had MRI, etc with [facility] that per his report was ok (dr. Stephannie Li). Echo (4/11): EF 55-60%, no regional WMAs, normal diastolic function, mild LAE, no source of embolus    Past Surgical History:  Procedure Laterality Date  . BACK SURGERY    .  BMI 30.2 muscular  12/08/04  . curretage actinic keratosis R ear  01/20/05  . ETT low prob of CAD  11/21/03  . excision melanoma in situ back  01/20/05  . hemorrhoids sclerosed at colonoscopy  12/19/05  . LAPAROSCOPIC INGUINAL HERNIA REPAIR  12/20/95  . retinal laser surgery  09/20/86  . SEPTOPLASTY  11/19/98  . TOTAL KNEE ARTHROPLASTY Left 12/13/2016   Procedure: TOTAL KNEE ARTHROPLASTY WITH RIGHT KNEE CORTISONE INJECTION;  Surgeon: Frederik Pear, MD;  Location: [facility];  Service: Orthopedics;  Laterality: Left;  . uric acid 6.9  12/22/04  . x-ray l great toe  07/21/00    Family History  Problem Relation Age of Onset  . Heart failure Brother 44       CABGx4  . Heart attack Mother 11  . Heart attack Father 32  . Asthma Son   . Diabetes Brother     Social History   Socioeconomic History  . Marital status: Married    Spouse name: [name]  . Number of children: 2  . Years of education: 71  . Highest education level: Not on file  Occupational History  . Occupation: Systems developer: [employer]  Tobacco Use  . Smoking status: Never Smoker  . Smokeless tobacco: Never Used  Substance and Sexual Activity  . Alcohol use: No  . Drug use: No  . Sexual activity: Not on file  Other Topics Concern  . Not on file  Social History Narrative   Married to [employer] at [employer].   Son, [name] married   Son, [name], Married.    Caffeine use: 1 cup coffee per day   Social Determinants of Health   Financial Resource Strain:   . Difficulty of Paying Living Expenses: Not on file  Food Insecurity:   . Worried About Charity fundraiser in the Last Year: Not on file  . Ran Out of Food in the Last Year: Not on file  Transportation Needs:   . Lack of Transportation (Medical): Not on file  . Lack of Transportation (Non-Medical): Not on file  Physical Activity:   . Days of Exercise per Week: Not on file  . Minutes of Exercise per Session: Not on file  Stress:   . Feeling  of Stress : Not on file  Social Connections:   . Frequency of Communication with Friends and Family: Not on file  . Frequency of Social Gatherings with Friends and Family: Not on file  . Attends Religious Services: Not on file  . Active Member of Clubs or Organizations: Not on file  . Attends Archivist Meetings: Not on file  . Marital Status: Not on file  Intimate Partner Violence:   . Fear of Current or Ex-Partner: Not on file  .  Emotionally Abused: Not on file  . Physically Abused: Not on file  . Sexually Abused: Not on file    Past Medical History, Surgical history, Social history, and Family history were reviewed and updated as appropriate.   Please see review of systems for further details on the patient's review from today.   Review of Systems:  Review of Systems  Constitutional: Positive for appetite change. Negative for chills, diaphoresis and fever.  HENT: Negative for trouble swallowing.   Respiratory: Negative for cough, choking, shortness of breath and wheezing.   Cardiovascular: Negative for chest pain and palpitations.  Gastrointestinal: Positive for abdominal pain, nausea and vomiting. Negative for constipation and diarrhea.       Belching  Genitourinary: Negative for decreased urine volume.  Neurological: Negative for headaches.    Objective:   Physical Exam:  BP 136/70 (BP Location: Right Arm, Patient Position: Sitting) Comment: Simultaneous filing. User may not have seen previous data. Comment (BP Location): Simultaneous filing. User may not have seen previous data. Comment (Patient Position): Simultaneous filing. User may not have seen previous data.  Pulse 68 Comment: Simultaneous filing. User may not have seen previous data.  Temp 98.1 F (36.7 C) (Oral) Comment: Simultaneous filing. User may not have seen previous data. Comment (Src): Simultaneous filing. User may not have seen previous data.  Resp 18 Comment: Simultaneous filing. User may not  have seen previous data.  Ht 5\' 11"  (1.803 m)   Wt 80.1 kg (176 lb 9.6 oz)   SpO2 96% Comment: Simultaneous filing. User may not have seen previous data.  BMI 24.63 kg/m  ECOG: 1  Physical Exam Constitutional:      General: He is not in acute distress.    Appearance: He is not diaphoretic.  HENT:     Head: Normocephalic and atraumatic.  Cardiovascular:     Rate and Rhythm: Normal rate and regular rhythm.     Heart sounds: Normal heart sounds. No murmur heard.  No friction rub. No gallop.   Pulmonary:     Effort: Pulmonary effort is normal. No respiratory distress.     Breath sounds: Normal breath sounds. No wheezing or rales.  Abdominal:     Tenderness: There is abdominal tenderness (Left upper quadrant tenderness) in the left upper quadrant.    Skin:    General: Skin is warm and dry.     Findings: No erythema or rash.  Neurological:     Mental Status: He is alert.     Lab Review:     Component Value Date/Time   NA 140 05/13/2020 1113   NA 144 10/17/2015 1120   K 4.0 05/13/2020 1113   CL 104 05/13/2020 1113   CO2 25 05/13/2020 1113   GLUCOSE 105 (H) 05/13/2020 1113   BUN 28 (H) 05/13/2020 1113   BUN 22 10/17/2015 1120   CREATININE 1.20 05/13/2020 1113   CALCIUM 9.6 05/13/2020 1113   PROT 6.8 05/13/2020 1113   ALBUMIN 3.3 (L) 05/13/2020 1113   AST 30 05/13/2020 1113   ALT 21 05/13/2020 1113   ALKPHOS 66 05/13/2020 1113   BILITOT 0.5 05/13/2020 1113   GFRNONAA 60 (L) 05/13/2020 1113   GFRAA >60 05/13/2020 1113       Component Value Date/Time   WBC 11.9 (H) 05/13/2020 1113   WBC 7.6 05/08/2020 0932   RBC 3.93 (L) 05/13/2020 1113   HGB 11.8 (L) 05/13/2020 1113   HCT 35.5 (L) 05/13/2020 1113   PLT 305 05/13/2020 1113  MCV 90.3 05/13/2020 1113   MCH 30.0 05/13/2020 1113   MCHC 33.2 05/13/2020 1113   RDW 12.4 05/13/2020 1113   LYMPHSABS 0.7 05/13/2020 1113   MONOABS 0.9 05/13/2020 1113   EOSABS 0.1 05/13/2020 1113   BASOSABS 0.0 05/13/2020 1113    -------------------------------  Imaging from last 24 hours (if applicable):  Radiology interpretation: CT ABDOMEN PELVIS W CONTRAST  Result Date: 04/17/2020 CLINICAL DATA:  Left upper abdominal discomfort. Possible hernia. Heartburn. History of melanoma. EXAM: CT ABDOMEN AND PELVIS WITH CONTRAST TECHNIQUE: Multidetector CT imaging of the abdomen and pelvis was performed using the standard protocol following bolus administration of intravenous contrast. CONTRAST:  1105mL ISOVUE-300 IOPAMIDOL (ISOVUE-300) INJECTION 61% COMPARISON:  12/19/2009 limited abdominal ultrasound. No comparison CT. FINDINGS: Lower chest: Multiple left lower lobe pleural-based pulmonary nodules, including at 1.0 cm on 13/4 and 7 mm on 12/04. Normal heart size without pericardial or pleural effusion. Normal heart size without pericardial or pleural effusion. Distal right coronary artery atherosclerosis. Hepatobiliary: Normal liver. Stone within the gallbladder fundus of 6 mm. No acute cholecystitis or biliary duct dilatation. Pancreas: Normal, without mass or ductal dilatation. Spleen: Normal in size, without focal abnormality. Adrenals/Urinary Tract: Normal right adrenal gland. Mild left adrenal nodularity. Multiple bilateral renal collecting system calculi, on the order of 4 mm and less. Right renal too small to characterize lesions. An exophytic anterior interpolar left renal 1.4 cm lesion measures greater than fluid density, including on 34/2 and 24/11. Normal urinary bladder. Stomach/Bowel: Normal stomach, without wall thickening. Periampullary duodenal diverticulum. No small bowel obstruction. Normal terminal ileum and appendix. Apparent soft tissue fullness at and just cephalad the ileocecal valve, within the ascending colon on 47/2 on coronal image 68. Vascular/Lymphatic: Aortic atherosclerosis. Prominent but not pathologically sized abdominal retroperitoneal nodes. Example left periaortic 9 mm node on 37/2. A dominant nodal  mass within the jejunal mesentery measures 11.5 x 10.9 cm on 32/2. Smaller surrounding nodes, including at 7 mm on 26/2. No pelvic sidewall adenopathy. Reproductive: Mild prostatomegaly. Other: Trace free pelvic fluid. Tiny fat containing right inguinal hernia. No free intraperitoneal air. Musculoskeletal: Bilateral L5 pars defects. Advanced degenerate disc disease at this level with grade 2 L5-S1 anterolisthesis. IMPRESSION: 1. Dominant nodal mass within the jejunal mesentery, highly suspicious for lymphoma. Given the clinical history of melanoma, metastatic disease is possible but felt less likely. 2. Left lower lobe pleural-based pulmonary nodules, for which metastatic disease or lymphoma is occluded. Consider complete staging with chest CT or PET. 3. Apparent soft tissue fullness at and just cephalad the ileocecal valve. Cannot exclude primary colonic carcinoma. Correlate with colon cancer screening history and possibly optical colonoscopy. 4. Coronary artery atherosclerosis. Aortic Atherosclerosis (ICD10-I70.0). 5. Cholelithiasis. 6. Bilateral nephrolithiasis. 7. Exophytic left renal lesion which measures greater than fluid density. Complex cyst or solid neoplasm. This could be re-evaluated at follow-up or more entirely characterized with dedicated pre and post contrast abdominal MRI. These results will be called to the ordering clinician or representative by the Radiologist Assistant, and communication documented in the PACS or [employer]. Electronically Signed   By: Abigail Miyamoto M.D.   On: 04/17/2020 11:44   NM PET Image Initial (PI) Skull Base To Thigh  Result Date: 04/28/2020 CLINICAL DATA:  Initial treatment strategy for mesenteric nodal mass. Reported history of melanoma. EXAM: NUCLEAR MEDICINE PET SKULL BASE TO THIGH TECHNIQUE: 9.1 mCi F-18 FDG was injected intravenously. Full-ring PET imaging was performed from the skull base to thigh after the radiotracer. CT data was  obtained and used for  attenuation correction and anatomic localization. Fasting blood glucose: 109 mg/dl COMPARISON:  CT abdomen 04/17/2020 FINDINGS: Mediastinal blood pool activity: SUV max 2.1 Liver activity: SUV max 2.6 NECK: Symmetric activity in the vicinity of the pharyngoesophageal junction, maximum SUV 13.0. Symmetric glottic activity. This regional activity is likely physiologic. Incidental CT findings: none CHEST: Hypermetabolic lymph node in the right pericardial adipose tissue adjacent to the diaphragm, 0.6 cm in short axis on image 101/4, maximum SUV 7.4, Deauville 5. Activity along the inferior margin of the right first costosternal joint probably related to arthropathy, less likely a small internal mammary lymph node, maximum SUV 3.4, [place] 4. Left basilar juxta diaphragmatic lymph node along the pleural adipose tissue with maximum SUV 5.1, [place] 4. Subtle nodularity along the minor fissure about 0.3 cm in diameter without accentuated metabolic activity. Clustered/branching nodularity peripherally in the left upper lobe with individual nodules up to 0.5 cm in diameter on image 25/8, maximum SUV 1.1. The branching pattern tends to favor atypical infectious bronchiolitis. Subpleural plaque or nodularity along the left hemidiaphragm 0.5 cm, maximum SUV 2.3 (Deauville 3). Additional adjacent mild pleural-based nodularity as on the prior exam. Incidental CT findings: Coronary, aortic arch, and branch vessel atherosclerotic vascular disease. Calcified right paratracheal lymph nodes compatible with old granulomatous disease. ABDOMEN/PELVIS: The 13.0 by 10.7 cm left mesenteric mass on image 128/4 has maximum SUV of 21.2, De[place]ville 5. Hypermetabolic porta hepatis and retroperitoneal adenopathy observed. A left periaortic lymph node measures 1.3 cm in short axis on image 142/4 (formerly 1.2 cm by my measurements) and has a maximum SUV of 18.9, De[place] 5. A right inguinal lymph node measuring 0.9 cm in short axis on image  192/4 has maximum SUV of 3.4, Deauville 4. There are some scattered additional small mesenteric lymph nodes with only low-grade activity. Mild mesenteric edema associated with the mesenteric mass. No splenomegaly or significant hypermetabolic splenic lesion is observed. Incidental CT findings: Aortoiliac atherosclerotic vascular disease. Bilateral nonobstructive nephrolithiasis. Retroperitoneal and mesenteric stranding primarily related to the large mesenteric mass. Small amount of pelvic ascites. Mild prostatomegaly. SKELETON: No significant abnormal hypermetabolic activity in this region. 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 Cervical spine plate and screw fixator. IMPRESSION: 1. Large solid left mesenteric mass 13.0 cm with maximum SUV of 21.2, Deauville 5. 2. Hypermetabolic porta hepatis and retroperitoneal lymph nodes along with small but hypermetabolic juxtadiaphragmatic/pericardial lymph nodes in the lower chest, generally Deauville 4 and Deauville 5. 3. Branching nodularity in the left upper lobe, Deauville 2 activity, probably from atypical infectious bronchiolitis. 4. Subpleural plaque/nodularity along the left hemidiaphragm with Deauville 3 activity. 5. Accentuated activity at the pharyngoesophageal junction, most likely physiologic. 6. Other imaging findings of potential clinical significance: Aortic Atherosclerosis (ICD10-I70.0). Coronary atherosclerosis. 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 Bilateral nonobstructive nephrolithiasis. Small amount of pelvic ascites. Mild prostatomegaly. Electronically Signed   By: Yareli Carthen Clines M.D.   On: 04/28/2020 11:35   CT Biopsy  Result Date: 05/08/2020 CLINICAL DATA:  13 cm left-sided mesenteric abdominal mass. EXAM: CT GUIDED CORE BIOPSY OF MESENTERIC  MASS ANESTHESIA/SEDATION: 1.5 mg IV Versed; 75 mcg IV Fentanyl Total Moderate Sedation Time:  18 minutes. The patient's level of consciousness and physiologic status were continuously monitored during the procedure by Radiology nursing. PROCEDURE: The procedure risks, benefits, and alternatives were  explained to the patient. Questions regarding the procedure were encouraged and answered. The patient understands and consents to the procedure. A time-out was performed prior to initiating the procedure. CT was performed through the abdomen in a supine position. The left abdominal wall was prepped with chlorhexidine in a sterile fashion, and a sterile drape was applied covering the operative field. A sterile gown and sterile gloves were used for the procedure. Local anesthesia was provided with 1% Lidocaine. Under CT guidance, a 17 gauge trocar needle was advanced to the level of a left-sided mesenteric mass. After confirming needle tip position, 4 separate coaxial 18 gauge core biopsy samples were obtained and submitted in saline. Some Gel-Foam pledgets were advanced through the outer needle as the needle was retracted and removed. COMPLICATIONS: None FINDINGS: Large left-sided intra-abdominal mass within the mesentery measures up to 13.3 cm in maximum diameter. Solid tissue was obtained. IMPRESSION: CT-guided core biopsy performed of large left-sided mesenteric mass measuring just over 13 cm in estimated maximal diameter. Electronically Signed   By: Aletta Edouard M.D.   On: 05/08/2020 15:02        This patient was seen with Dr. Lorenso Courier with my treatment plan reviewed with her. She expressed agreement with my medical management of this patient.

## 2020-05-15 ENCOUNTER — Other Ambulatory Visit: Payer: Self-pay | Admitting: Radiology

## 2020-05-15 ENCOUNTER — Other Ambulatory Visit: Payer: Self-pay

## 2020-05-15 ENCOUNTER — Encounter: Payer: Self-pay | Admitting: Hematology and Oncology

## 2020-05-15 ENCOUNTER — Inpatient Hospital Stay: Payer: Medicare PPO | Admitting: Hematology and Oncology

## 2020-05-15 ENCOUNTER — Inpatient Hospital Stay: Payer: Medicare PPO

## 2020-05-15 VITALS — BP 132/72 | HR 66 | Temp 98.4°F | Resp 17 | Ht 71.0 in | Wt 173.6 lb

## 2020-05-15 DIAGNOSIS — E785 Hyperlipidemia, unspecified: Secondary | ICD-10-CM | POA: Diagnosis not present

## 2020-05-15 DIAGNOSIS — I129 Hypertensive chronic kidney disease with stage 1 through stage 4 chronic kidney disease, or unspecified chronic kidney disease: Secondary | ICD-10-CM | POA: Diagnosis not present

## 2020-05-15 DIAGNOSIS — M1990 Unspecified osteoarthritis, unspecified site: Secondary | ICD-10-CM | POA: Diagnosis not present

## 2020-05-15 DIAGNOSIS — K219 Gastro-esophageal reflux disease without esophagitis: Secondary | ICD-10-CM | POA: Diagnosis not present

## 2020-05-15 DIAGNOSIS — C8333 Diffuse large B-cell lymphoma, intra-abdominal lymph nodes: Secondary | ICD-10-CM | POA: Diagnosis not present

## 2020-05-15 DIAGNOSIS — I700 Atherosclerosis of aorta: Secondary | ICD-10-CM | POA: Diagnosis not present

## 2020-05-15 DIAGNOSIS — C8338 Diffuse large B-cell lymphoma, lymph nodes of multiple sites: Secondary | ICD-10-CM | POA: Diagnosis not present

## 2020-05-15 DIAGNOSIS — Z85820 Personal history of malignant melanoma of skin: Secondary | ICD-10-CM | POA: Diagnosis not present

## 2020-05-15 DIAGNOSIS — R591 Generalized enlarged lymph nodes: Secondary | ICD-10-CM | POA: Diagnosis not present

## 2020-05-15 DIAGNOSIS — N183 Chronic kidney disease, stage 3 unspecified: Secondary | ICD-10-CM | POA: Diagnosis not present

## 2020-05-15 LAB — RETIC PANEL
Immature Retic Fract: 18.1 % — ABNORMAL HIGH (ref 2.3–15.9)
RBC.: 3.81 MIL/uL — ABNORMAL LOW (ref 4.22–5.81)
Retic Count, Absolute: 50.7 10*3/uL (ref 19.0–186.0)
Retic Ct Pct: 1.3 % (ref 0.4–3.1)
Reticulocyte Hemoglobin: 33.4 pg (ref >27.9)

## 2020-05-15 LAB — CMP (CANCER CENTER ONLY)
ALT: 18 U/L (ref 0–44)
AST: 26 U/L (ref 15–41)
Albumin: 3 g/dL — ABNORMAL LOW (ref 3.5–5.0)
Alkaline Phosphatase: 63 U/L (ref 38–126)
Anion gap: 7 (ref 5–15)
BUN: 23 mg/dL (ref 8–23)
CO2: 28 mmol/L (ref 22–32)
Calcium: 9.4 mg/dL (ref 8.9–10.3)
Chloride: 101 mmol/L (ref 98–111)
Creatinine: 1.23 mg/dL (ref 0.61–1.24)
GFR, Est AFR Am: >60 mL/min (ref >60)
GFR, Estimated: 58 mL/min — ABNORMAL LOW (ref >60)
Glucose, Bld: 110 mg/dL — ABNORMAL HIGH (ref 70–99)
Potassium: 3.9 mmol/L (ref 3.5–5.1)
Sodium: 136 mmol/L (ref 135–145)
Total Bilirubin: 0.5 mg/dL (ref 0.3–1.2)
Total Protein: 6.5 g/dL (ref 6.5–8.1)

## 2020-05-15 LAB — CBC WITH DIFFERENTIAL (CANCER CENTER ONLY)
Abs Immature Granulocytes: 0.03 10*3/uL (ref 0.00–0.07)
Basophils Absolute: 0.1 10*3/uL (ref 0.0–0.1)
Basophils Relative: 1 %
Eosinophils Absolute: 0.1 10*3/uL (ref 0.0–0.5)
Eosinophils Relative: 1 %
HCT: 34.6 % — ABNORMAL LOW (ref 39.0–52.0)
Hemoglobin: 11.5 g/dL — ABNORMAL LOW (ref 13.0–17.0)
Immature Granulocytes: 0 %
Lymphocytes Relative: 8 %
Lymphs Abs: 0.8 10*3/uL (ref 0.7–4.0)
MCH: 29.9 pg (ref 26.0–34.0)
MCHC: 33.2 g/dL (ref 30.0–36.0)
MCV: 90.1 fL (ref 80.0–100.0)
Monocytes Absolute: 0.8 10*3/uL (ref 0.1–1.0)
Monocytes Relative: 8 %
Neutro Abs: 8.9 10*3/uL — ABNORMAL HIGH (ref 1.7–7.7)
Neutrophils Relative %: 82 %
Platelet Count: 294 10*3/uL (ref 150–400)
RBC: 3.84 MIL/uL — ABNORMAL LOW (ref 4.22–5.81)
RDW: 12.8 % (ref 11.5–15.5)
WBC Count: 10.8 10*3/uL — ABNORMAL HIGH (ref 4.0–10.5)
nRBC: 0 % (ref 0.0–0.2)

## 2020-05-15 LAB — IRON AND TIBC
Iron: 16 ug/dL — ABNORMAL LOW (ref 42–163)
Saturation Ratios: 8 % — ABNORMAL LOW (ref 20–55)
TIBC: 193 ug/dL — ABNORMAL LOW (ref 202–409)
UIBC: 177 ug/dL (ref 117–376)

## 2020-05-15 LAB — FERRITIN: Ferritin: 524 ng/mL — ABNORMAL HIGH (ref 24–336)

## 2020-05-15 MED ORDER — PROCHLORPERAZINE MALEATE 10 MG PO TABS: 10.0000 mg | ORAL_TABLET | Freq: Four times a day (QID) | ORAL | 0 refills | Status: DC | PRN

## 2020-05-15 NOTE — Progress Notes (Signed)
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   1) 04/17/2020:  CT A/P showed dominant nodal mass (11.5 x 10.9 cm) within the jejunal mesentery, highly suspicious for lymphoma. Additionally there are left lower lobe pleural-based pulmonary nodules 2) 04/24/2020: establish care with Dr. Lorenso Courier 3) 04/28/2020:  PET CT scan shows Large solid left mesenteric mass 13.0 cm with maximum SUV of 21.2, Deauville 5 with local hypermetabolic porta hepatis and retroperitoneal lymph nodes. Constitutes bulky Stage I disease.   Interval History:  Connor Adams 74 y.o. male with medical history significant for Stage I DLBCL who presents for a follow up visit. The patient's last visit was on 04/24/2020. In the interim since the last visit he has had his PET CT scan and biopsy to confirm the diagnosis.  On exam today Connor Adams notes that he feels improved from his last visit 2 days ago to the symptom management clinic.  He reports that he took the Zofran but it did cause him some heartburn and indigestion, and that the Phenergan medication he was taking was significantly better, but caused him to sleep throughout the night.  He notes that this was badly needed sleep as he had not been sleeping well due to the stomach pain.  He reports that he also has not had any further episodes of vomiting and has not had any dark bowel movements, though he did not have a bowel movement yesterday or today.  He reports that "everything is okay".  He notes that he has been physically active and he has been trying to eat more soft foods to try to help with the pressure that is being put on his stomach by the large abdominal mass.  Otherwise at this time he denies any fevers, chills, sweats, nausea, vomiting or  diarrhea.  Full 10 point ROS is listed below.  The bulk of our discussion was focused on the diagnosis and the likely treatments moving forward for his diffuse large B-cell lymphoma.  MEDICAL HISTORY:  Past Medical History:  Diagnosis Date  . Arthritis   . Chronic kidney disease (CKD), stage III (moderate)   . GERD (gastroesophageal reflux disease)    occ  . Hemorrhoids   . History of ETT 3/05   low risk  . History of hiatal hernia   . History of kidney stones   . HLD (hyperlipidemia)   . HTN (hypertension)   . Left inguinal hernia   . Melanoma ([place])    excied  . 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 Stroke [facility]) 2011   tia   . TIA (transient ischemic attack) 4/11   associated w slurred speecha ndmild facial droop. lasted for about 10 minutes. Had MRI, etc with [facility] that per his report was ok (dr. Stephannie Adams). Echo (4/11): EF 55-60%, no regional WMAs, normal diastolic function, mild LAE, no source of embolus    SURGICAL HISTORY: Past Surgical History:  Procedure Laterality Date  . BACK SURGERY    . BMI 30.2 muscular  12/08/04  . curretage actinic keratosis R ear  01/20/05  . ETT low prob of CAD  11/21/03  . excision melanoma in situ back  01/20/05  . hemorrhoids sclerosed at colonoscopy  12/19/05  . LAPAROSCOPIC INGUINAL HERNIA REPAIR  12/20/95  . retinal  laser surgery  09/20/86  . SEPTOPLASTY  11/19/98  . TOTAL KNEE ARTHROPLASTY Left 12/13/2016   Procedure: TOTAL KNEE ARTHROPLASTY WITH RIGHT KNEE CORTISONE INJECTION;  Surgeon: Frederik Pear, MD;  Location: [place];  Service: Orthopedics;  Laterality: Left;  . uric acid 6.9  12/22/04  . x-ray l great toe  07/21/00    SOCIAL HISTORY: Social History   Socioeconomic History  . Marital status: Married    Spouse name: Connor Adams  . Number of children: 2  . Years of education: 53  . Highest education level: Not on file  Occupational History  . Occupation: Systems developer: [employer]  Tobacco Use  . Smoking  status: Never Smoker  . Smokeless tobacco: Never Used  Substance and Sexual Activity  . Alcohol use: No  . Drug use: No  . Sexual activity: Not on file  Other Topics Concern  . Not on file  Social History Narrative   Married to [employer] at [employer].   Son, Connor Adams married   Son, Connor Adams, Married.    Caffeine use: 1 cup coffee per day   Social Determinants of Health   Financial Resource Strain:   . Difficulty of Paying Living Expenses: Not on file  Food Insecurity:   . Worried About Charity fundraiser in the Last Year: Not on file  . Ran Out of Food in the Last Year: Not on file  Transportation Needs:   . Lack of Transportation (Medical): Not on file  . Lack of Transportation (Non-Medical): Not on file  Physical Activity:   . Days of Exercise per Week: Not on file  . Minutes of Exercise per Session: Not on file  Stress:   . Feeling of Stress : Not on file  Social Connections:   . Frequency of Communication with Friends and Family: Not on file  . Frequency of Social Gatherings with Friends and Family: Not on file  . Attends Religious Services: Not on file  . Active Member of Clubs or Organizations: Not on file  . Attends Archivist Meetings: Not on file  . Marital Status: Not on file  Intimate Partner Violence:   . Fear of Current or Ex-Partner: Not on file  . Emotionally Abused: Not on file  . Physically Abused: Not on file  . Sexually Abused: Not on file    FAMILY HISTORY: Family History  Problem Relation Age of Onset  . Heart failure Brother 44       CABGx4  . Heart attack Mother 57  . Heart attack Father 32  . Asthma Son   . Diabetes Brother     ALLERGIES:  has No Known Allergies.  MEDICATIONS:  Current Outpatient Medications  Medication Sig Dispense Refill  . ARTIFICIAL TEAR OP Apply 1 drop to eye daily as needed (dry eyes).    Marland Kitchen aspirin 325 MG tablet Take 1 tablet (325 mg total) by mouth 2 (two) times daily. (Patient taking  differently: Take 162.5 mg by mouth 2 (two) times daily. ) 30 tablet 0  . fluticasone (FLONASE) 50 MCG/ACT nasal spray Place 1 spray into both nostrils as needed for allergies.     . hydrocortisone (PROCTOSOL HC) 2.5 % rectal cream Place 1 application rectally daily as needed for hemorrhoids or itching.    Marland Kitchen ibuprofen (ADVIL,MOTRIN) 200 MG tablet Take 400 mg by mouth daily as needed for headache or moderate pain. For  pain (Patient not taking:  Reported on 05/15/2020)    . indomethacin (INDOCIN) 50 MG capsule Take 50 mg by mouth 3 (three) times daily as needed (gout flare).  (Patient not taking: Reported on 05/15/2020)    . loratadine (CLARITIN) 10 MG tablet Take 10 mg by mouth daily as needed for allergies.    Marland Kitchen losartan (COZAAR) 25 MG tablet Take 25 mg by mouth daily.     . Multiple Vitamins-Minerals (MULTIVITAMINS THER. W/MINERALS) TABS Take 1 tablet by mouth daily.      . Omega-3 Fatty Acids (OMEGA 3 PO) Take 600 mg by mouth daily.    . ondansetron (ZOFRAN ODT) 8 MG disintegrating tablet Take 1 tablet (8 mg total) by mouth every 8 (eight) hours as needed for nausea or vomiting. 30 tablet 5  . pantoprazole (PROTONIX) 40 MG tablet Take 1 tablet (40 mg total) by mouth 2 (two) times daily. 60 tablet 5  . PROAIR HFA 108 (90 Base) MCG/ACT inhaler Inhale 2 puffs into the lungs as needed for wheezing or shortness of breath.     . promethazine (PHENERGAN) 25 MG tablet Take 1 tablet (25 mg total) by mouth every 6 (six) hours as needed for nausea or vomiting. 45 tablet 2  . pseudoephedrine (SUDAFED) 30 MG tablet Take 30 mg by mouth every 4 (four) hours as needed for congestion.    . rosuvastatin (CRESTOR) 5 MG tablet Take 5 mg by mouth every evening.      No current facility-administered medications for this visit.    REVIEW OF SYSTEMS:   Constitutional: ( - ) fevers, ( - )  chills , ( - ) night sweats Eyes: ( - ) blurriness of vision, ( - ) double vision, ( - ) watery eyes Ears, nose, mouth, throat, and  face: ( - ) mucositis, ( - ) sore throat Respiratory: ( - ) cough, ( - ) dyspnea, ( - ) wheezes Cardiovascular: ( - ) palpitation, ( - ) chest discomfort, ( - ) lower extremity swelling Gastrointestinal:  ( - ) nausea, ( - ) heartburn, ( - ) change in bowel habits Skin: ( - ) abnormal skin rashes Lymphatics: ( - ) new lymphadenopathy, ( - ) easy bruising Neurological: ( - ) numbness, ( - ) tingling, ( - ) new weaknesses Behavioral/Psych: ( - ) mood change, ( - ) new changes  All other systems were reviewed with the patient and are negative.  PHYSICAL EXAMINATION: ECOG PERFORMANCE STATUS: 1 - Symptomatic but completely ambulatory  Vitals:   05/15/20 1043  BP: 132/72  Pulse: 66  Resp: 17  Temp: 98.4 F (36.9 C)  SpO2: 98%   Filed Weights   05/15/20 1043  Weight: 173 lb 9.6 oz (78.7 kg)    GENERAL: alert, no distress and comfortable SKIN: skin color, texture, turgor are normal, no rashes or significant lesions EYES: conjunctiva are pink and non-injected, sclera clear OROPHARYNX: no exudate, no erythema; lips, buccal mucosa, and tongue normal  NECK: supple, non-tender LYMPH:  no palpable lymphadenopathy in the cervical, axillary or inguinal LUNGS: clear to auscultation and percussion with normal breathing effort HEART: regular rate & rhythm and no murmurs and no lower extremity edema ABDOMEN: soft, non-tender, non-distended, normal bowel sounds Musculoskeletal: no cyanosis of digits and no clubbing  PSYCH: alert & oriented x 3, fluent speech NEURO: no focal motor/sensory deficits  LABORATORY DATA:  I have reviewed the data as listed CBC Latest Ref Rng & Units 05/15/2020 05/13/2020 05/08/2020  WBC 4.0 - 10.5 K/uL  10.8(H) 11.9(H) 7.6  Hemoglobin 13.0 - 17.0 g/dL 11.5(L) 11.8(L) 12.5(L)  Hematocrit 39 - 52 % 34.6(L) 35.5(L) 39.2  Platelets 150 - 400 K/uL 294 305 297    CMP Latest Ref Rng & Units 05/15/2020 05/13/2020 04/24/2020  Glucose 70 - 99 mg/dL 110(H) 105(H) 99  BUN 8 - 23  mg/dL 23 28(H) 20  Creatinine 0.61 - 1.24 mg/dL 1.23 1.20 1.28(H)  Sodium 135 - 145 mmol/L 136 140 142  Potassium 3.5 - 5.1 mmol/L 3.9 4.0 4.3  Chloride 98 - 111 mmol/L 101 104 105  CO2 22 - 32 mmol/L 28 25 27   Calcium 8.9 - 10.3 mg/dL 9.4 9.6 10.3  Total Protein 6.5 - 8.1 g/dL 6.5 6.8 7.7  Total Bilirubin 0.3 - 1.2 mg/dL 0.5 0.5 0.5  Alkaline Phos 38 - 126 U/L 63 66 70  AST 15 - 41 U/L 26 30 20   ALT 0 - 44 U/L 18 21 15     RADIOGRAPHIC STUDIES: I have personally reviewed the radiological images as listed and agreed with the findings in the report: large abdominal mass consistent with biopsy proven DLBCL. Bulky tumor.  CT ABDOMEN PELVIS W CONTRAST  Result Date: 04/17/2020 CLINICAL DATA:  Left upper abdominal discomfort. Possible hernia. Heartburn. History of melanoma. EXAM: CT ABDOMEN AND PELVIS WITH CONTRAST TECHNIQUE: Multidetector CT imaging of the abdomen and pelvis was performed using the standard protocol following bolus administration of intravenous contrast. CONTRAST:  139mL ISOVUE-300 IOPAMIDOL (ISOVUE-300) INJECTION 61% COMPARISON:  12/19/2009 limited abdominal ultrasound. No comparison CT. FINDINGS: Lower chest: Multiple left lower lobe pleural-based pulmonary nodules, including at 1.0 cm on 13/4 and 7 mm on 12/04. Normal heart size without pericardial or pleural effusion. Normal heart size without pericardial or pleural effusion. Distal right coronary artery atherosclerosis. Hepatobiliary: Normal liver. Stone within the gallbladder fundus of 6 mm. No acute cholecystitis or biliary duct dilatation. Pancreas: Normal, without mass or ductal dilatation. Spleen: Normal in size, without focal abnormality. Adrenals/Urinary Tract: Normal right adrenal gland. Mild left adrenal nodularity. Multiple bilateral renal collecting system calculi, on the order of 4 mm and less. Right renal too small to characterize lesions. An exophytic anterior interpolar left renal 1.4 cm lesion measures greater than  fluid density, including on 34/2 and 24/11. Normal urinary bladder. Stomach/Bowel: Normal stomach, without wall thickening. Periampullary duodenal diverticulum. No small bowel obstruction. Normal terminal ileum and appendix. Apparent soft tissue fullness at and just cephalad the ileocecal valve, within the ascending colon on 47/2 on coronal image 68. Vascular/Lymphatic: Aortic atherosclerosis. Prominent but not pathologically sized abdominal retroperitoneal nodes. Example left periaortic 9 mm node on 37/2. A dominant nodal mass within the jejunal mesentery measures 11.5 x 10.9 cm on 32/2. Smaller surrounding nodes, including at 7 mm on 26/2. No pelvic sidewall adenopathy. Reproductive: Mild prostatomegaly. Other: Trace free pelvic fluid. Tiny fat containing right inguinal hernia. No free intraperitoneal air. Musculoskeletal: Bilateral L5 pars defects. Advanced degenerate disc disease at this level with grade 2 L5-S1 anterolisthesis. IMPRESSION: 1. Dominant nodal mass within the jejunal mesentery, highly suspicious for lymphoma. Given the clinical history of melanoma, metastatic disease is possible but felt less likely. 2. Left lower lobe pleural-based pulmonary nodules, for which metastatic disease or lymphoma is occluded. Consider complete staging with chest CT or PET. 3. Apparent soft tissue fullness at and just cephalad the ileocecal valve. Cannot exclude primary colonic carcinoma. Correlate with colon cancer screening history and possibly optical colonoscopy. 4. Coronary artery atherosclerosis. Aortic Atherosclerosis (ICD10-I70.0). 5. Cholelithiasis. 6. Bilateral  nephrolithiasis. 7. Exophytic left renal lesion which measures greater than fluid density. Complex cyst or solid neoplasm. This could be re-evaluated at follow-up or more entirely characterized with dedicated pre and post contrast abdominal MRI. These results will be called to the ordering clinician or representative by the Radiologist Assistant, and  communication documented in the PACS or [employer]. Electronically Signed   By: Abigail Miyamoto M.D.   On: 04/17/2020 11:44   NM PET Image Initial (PI) Skull Base To Thigh  Result Date: 04/28/2020 CLINICAL DATA:  Initial treatment strategy for mesenteric nodal mass. Reported history of melanoma. EXAM: NUCLEAR MEDICINE PET SKULL BASE TO THIGH TECHNIQUE: 9.1 mCi F-18 FDG was injected intravenously. Full-ring PET imaging was performed from the skull base to thigh after the radiotracer. CT data was obtained and used for attenuation correction and anatomic localization. Fasting blood glucose: 109 mg/dl COMPARISON:  CT abdomen 04/17/2020 FINDINGS: Mediastinal blood pool activity: SUV max 2.1 Liver activity: SUV max 2.6 NECK: Symmetric activity in the vicinity of the pharyngoesophageal junction, maximum SUV 13.0. Symmetric glottic activity. This regional activity is likely physiologic. Incidental CT findings: none CHEST: Hypermetabolic lymph node in the right pericardial adipose tissue adjacent to the diaphragm, 0.6 cm in short axis on image 101/4, maximum SUV 7.4, Deauville 5. Activity along the inferior margin of the right first costosternal joint probably related to arthropathy, less likely a small internal mammary lymph node, maximum SUV 3.4, Deauville 4. Left basilar juxta diaphragmatic lymph node along the pleural adipose tissue with maximum SUV 5.1, Deauville 4. Subtle nodularity along the minor fissure about 0.3 cm in diameter without accentuated metabolic activity. Clustered/branching nodularity peripherally in the left upper lobe with individual nodules up to 0.5 cm in diameter on image 25/8, maximum SUV 1.1. The branching pattern tends to favor atypical infectious bronchiolitis. Subpleural plaque or nodularity along the left hemidiaphragm 0.5 cm, maximum SUV 2.3 (Deauville 3). Additional adjacent mild pleural-based nodularity as on the prior exam. Incidental CT findings: Coronary, aortic arch, and branch  vessel atherosclerotic vascular disease. Calcified right paratracheal lymph nodes compatible with old granulomatous disease. ABDOMEN/PELVIS: The 13.0 by 10.7 cm left mesenteric mass on image 128/4 has maximum SUV of 21.2, Deauville 5. Hypermetabolic porta hepatis and retroperitoneal adenopathy observed. A left periaortic lymph node measures 1.3 cm in short axis on image 142/4 (formerly 1.2 cm by my measurements) and has a maximum SUV of 18.9, Deauville 5. A right inguinal lymph node measuring 0.9 cm in short axis on image 192/4 has maximum SUV of 3.4, Deauville 4. There are some scattered additional small mesenteric lymph nodes with only low-grade activity. Mild mesenteric edema associated with the mesenteric mass. No splenomegaly or significant hypermetabolic splenic lesion is observed. Incidental CT findings: Aortoiliac atherosclerotic vascular disease. Bilateral nonobstructive nephrolithiasis. Retroperitoneal and mesenteric stranding primarily related to the large mesenteric mass. Small amount of pelvic ascites. Mild prostatomegaly. SKELETON: No significant abnormal hypermetabolic activity in this region. 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 Cervical spine plate and screw fixator. IMPRESSION: 1. Large solid left mesenteric mass 13.0 cm with maximum SUV of 21.2, Deauville 5. 2. Hypermetabolic porta hepatis and retroperitoneal lymph nodes along with small but hypermetabolic juxtadiaphragmatic/pericardial lymph nodes in the lower chest, generally Deauville 4 and Deauville 5. 3. Branching nodularity in the left upper lobe, Deauville 2 activity, probably from atypical infectious bronchiolitis. 4. Subpleural plaque/nodularity along the left hemidiaphragm with Deauville  3 activity. 5. Accentuated activity at the pharyngoesophageal junction, most likely physiologic. 6. Other imaging findings of potential  clinical significance: Aortic Atherosclerosis (ICD10-I70.0). Coronary atherosclerosis. 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 Bilateral nonobstructive nephrolithiasis. Small amount of pelvic ascites. Mild prostatomegaly. Electronically Signed   By: Van Clines M.D.   On: 04/28/2020 11:35   CT Biopsy  Result Date: 05/08/2020 CLINICAL DATA:  13 cm left-sided mesenteric abdominal mass. EXAM: CT GUIDED CORE BIOPSY OF MESENTERIC MASS ANESTHESIA/SEDATION: 1.5 mg IV Versed; 75 mcg IV Fentanyl Total Moderate Sedation Time:  18 minutes. The patient's level of consciousness and physiologic status were continuously monitored during the procedure by Radiology nursing. PROCEDURE: The procedure risks, benefits, and alternatives were explained to the patient. Questions regarding the procedure were encouraged and answered. The patient understands and consents to the procedure. A time-out was performed prior to initiating the procedure. CT was performed through the abdomen in a supine position. The left abdominal wall was prepped with chlorhexidine in a sterile fashion, and a sterile drape was applied covering the operative field. A sterile gown and sterile gloves were used for the procedure. Local anesthesia was provided with 1% Lidocaine. Under CT guidance, a 17 gauge trocar needle was advanced to the level of a left-sided mesenteric mass. After confirming needle tip position, 4 separate coaxial 18 gauge core biopsy samples were obtained and submitted in saline. Some Gel-Foam pledgets were advanced through the outer needle as the needle was retracted and removed. COMPLICATIONS: None FINDINGS: Large left-sided intra-abdominal mass within the mesentery measures up to 13.3 cm in maximum diameter. Solid tissue was obtained. IMPRESSION: CT-guided core biopsy performed of large left-sided mesenteric mass measuring just over 13 cm  in estimated maximal diameter. Electronically Signed   By: Aletta Edouard M.D.   On: 05/08/2020 15:02    ASSESSMENT & PLAN Connor Adams 74 y.o. male with medical history significant for Stage I DLBCL who presents for a follow up visit.  After review the labs, review the imaging, review the pathology and discussion with the patient the findings are most consistent with a stage I diffuse large B-cell lymphoma predominantly involving the lymph nodes of the abdomen.  At this time I think he is an excellent candidate for chemotherapy treatment and the treatment of choice will depend on the results of the [facility] panel determining whether not he has a double or triple hit lymphoma.  Today I do believe we can get him teed up for chemotherapy by placing a port, obtaining echo, and discussing treatment plan moving forward.  Final chemotherapy plan will be listed once it is apparent which type of diffuse large B cell lymphoma he has.  I assume the treatment will either consist of 6 cycles of outpatient R-CHOP versus dose adjusted R-EPOCH in the inpatient setting.   IPI Score: 2 points, good prognosis/ low-intermediate risk group (81% OS, 80% PFS)  # Diffuse Large B Cell Lymphoma, FISH panel pending. Stage I bulky disease.   --await results of FISH panel to determine the treatment course moving forward. Will begin preparing patient for chemotherapy either way (R-CHOP vs DA-EPOCH) --will arrange port placement and TTE.  --discussed the treatment of R-CHOP today presuming this is not double/triple hit lymphoma. Discussed that inpatient treatment is required for those and the plan would train.  --assume we will be starting outpatient chemotherapy in approximately 2 weeks once the [facility] panel has returned.   #  Single Episode of Hematemesis #Nausea/Vomiting, resolving --concern that patient had series of episodes of vomiting with single episode of bright red hematemesis --strongest suspicion that this was due to  mild esophageal tearing from retching, however given the proximity of this mass to his stomach and the plan to start cytotoxic chemotherapy (with count drops) I would prefer to have this evaluated prior to our start.  --provided patient with zofran, phenergan, and pantoprazole 40mg  BID --referral placed to GI for urgent evaluation.   Orders Placed This Encounter  Procedures  . IR Perc Tun Perit Cath W/Port    Standing Status:   Future    Standing Expiration Date:   05/15/2021    Order Specific Question:   Reason for exam:    Answer:   start of chemotherapy, requires port placement    Order Specific Question:   Preferred Imaging Location?    Answer:   [facility]  . Ambulatory referral to Gastroenterology    Referral Priority:   Urgent    Referral Type:   Consultation    Referral Reason:   Specialty Services Required    Number of Visits Requested:   1  . ECHOCARDIOGRAM COMPLETE    Standing Status:   Future    Standing Expiration Date:   05/15/2021    Scheduling Instructions:     Required prior to chemotherapy, please schedule ASAP.    Order Specific Question:   Where should this test be performed    Answer:   [facility]    Order Specific Question:   Perflutren DEFINITY (image enhancing agent) should be administered unless hypersensitivity or allergy exist    Answer:   Administer Perflutren    Order Specific Question:   Is a special reader required? (athlete or structural heart)    Answer:   No    Order Specific Question:   Does this study need to be read by the Structural team/Level 3 readers?    Answer:   No    Order Specific Question:   Reason for exam-Echo    Answer:   Chemotherapy evaluation  v87.41 / v58.11    All questions were answered. The patient knows to call the clinic with any problems, questions or concerns.  A total of more than 40 minutes were spent on this encounter and over half of that time was spent on counseling and coordination of care as outlined  above.   [name], MD Department of Hematology/Oncology [place] at [facility] Phone: [phone] Pager: [phone] Email: [email or web address]  05/15/2020 8:03 PM

## 2020-05-16 ENCOUNTER — Other Ambulatory Visit: Payer: Self-pay | Admitting: Radiology

## 2020-05-16 ENCOUNTER — Telehealth: Payer: Self-pay | Admitting: Hematology and Oncology

## 2020-05-16 NOTE — Telephone Encounter (Signed)
Scheduled per los. Called and spoke with patients wife. Confirmed appt  

## 2020-05-19 ENCOUNTER — Other Ambulatory Visit: Payer: Self-pay

## 2020-05-19 ENCOUNTER — Telehealth: Payer: Self-pay | Admitting: *Deleted

## 2020-05-19 ENCOUNTER — Ambulatory Visit (HOSPITAL_COMMUNITY)
Admission: RE | Admit: 2020-05-19 | Discharge: 2020-05-19 | Disposition: A | Discharge status: Home / Self Care | Payer: Medicare PPO | Source: Ambulatory Visit | Attending: Medical | Admitting: Medical

## 2020-05-19 ENCOUNTER — Encounter (HOSPITAL_COMMUNITY): Payer: Self-pay

## 2020-05-19 ENCOUNTER — Ambulatory Visit (HOSPITAL_COMMUNITY)
Admission: RE | Admit: 2020-05-19 | Discharge: 2020-05-19 | Disposition: A | Discharge status: Home / Self Care | Payer: Medicare PPO | Source: Ambulatory Visit

## 2020-05-19 DIAGNOSIS — C8330 Diffuse large B-cell lymphoma, unspecified site: Secondary | ICD-10-CM | POA: Diagnosis not present

## 2020-05-19 DIAGNOSIS — E785 Hyperlipidemia, unspecified: Secondary | ICD-10-CM | POA: Insufficient documentation

## 2020-05-19 DIAGNOSIS — I129 Hypertensive chronic kidney disease with stage 1 through stage 4 chronic kidney disease, or unspecified chronic kidney disease: Secondary | ICD-10-CM | POA: Insufficient documentation

## 2020-05-19 DIAGNOSIS — Z7982 Long term (current) use of aspirin: Secondary | ICD-10-CM | POA: Diagnosis not present

## 2020-05-19 DIAGNOSIS — K219 Gastro-esophageal reflux disease without esophagitis: Secondary | ICD-10-CM | POA: Insufficient documentation

## 2020-05-19 DIAGNOSIS — C8333 Diffuse large B-cell lymphoma, intra-abdominal lymph nodes: Secondary | ICD-10-CM | POA: Diagnosis not present

## 2020-05-19 DIAGNOSIS — Z8673 Personal history of transient ischemic attack (TIA), and cerebral infarction without residual deficits: Secondary | ICD-10-CM | POA: Insufficient documentation

## 2020-05-19 DIAGNOSIS — N183 Chronic kidney disease, stage 3 unspecified: Secondary | ICD-10-CM | POA: Diagnosis not present

## 2020-05-19 DIAGNOSIS — Z5111 Encounter for antineoplastic chemotherapy: Secondary | ICD-10-CM | POA: Diagnosis not present

## 2020-05-19 DIAGNOSIS — Z79899 Other long term (current) drug therapy: Secondary | ICD-10-CM | POA: Insufficient documentation

## 2020-05-19 DIAGNOSIS — Z452 Encounter for adjustment and management of vascular access device: Secondary | ICD-10-CM | POA: Diagnosis not present

## 2020-05-19 HISTORY — PX: IR IMAGING GUIDED PORT INSERTION: IMG5740

## 2020-05-19 LAB — CBC WITH DIFFERENTIAL/PLATELET
Abs Immature Granulocytes: 0.02 10*3/uL (ref 0.00–0.07)
Basophils Absolute: 0.1 10*3/uL (ref 0.0–0.1)
Basophils Relative: 1 %
Eosinophils Absolute: 0.1 10*3/uL (ref 0.0–0.5)
Eosinophils Relative: 1 %
HCT: 34.6 % — ABNORMAL LOW (ref 39.0–52.0)
Hemoglobin: 11.5 g/dL — ABNORMAL LOW (ref 13.0–17.0)
Immature Granulocytes: 0 %
Lymphocytes Relative: 10 %
Lymphs Abs: 0.9 10*3/uL (ref 0.7–4.0)
MCH: 29.9 pg (ref 26.0–34.0)
MCHC: 33.2 g/dL (ref 30.0–36.0)
MCV: 89.9 fL (ref 80.0–100.0)
Monocytes Absolute: 0.6 10*3/uL (ref 0.1–1.0)
Monocytes Relative: 7 %
Neutro Abs: 6.8 10*3/uL (ref 1.7–7.7)
Neutrophils Relative %: 81 %
Platelets: 384 10*3/uL (ref 150–400)
RBC: 3.85 MIL/uL — ABNORMAL LOW (ref 4.22–5.81)
RDW: 12.9 % (ref 11.5–15.5)
WBC: 8.5 10*3/uL (ref 4.0–10.5)
nRBC: 0 % (ref 0.0–0.2)

## 2020-05-19 MED ORDER — MIDAZOLAM HCL 2 MG/2ML IJ SOLN
INTRAMUSCULAR | Status: AC
  Filled 2020-05-19: qty 2

## 2020-05-19 MED ORDER — LIDOCAINE-EPINEPHRINE 1 %-1:100000 IJ SOLN
INTRAMUSCULAR | Status: AC | PRN
  Administered 2020-05-19 (×2): 10 mL via INTRADERMAL

## 2020-05-19 MED ORDER — HEPARIN SOD (PORK) LOCK FLUSH 100 UNIT/ML IV SOLN
INTRAVENOUS | Status: AC
  Filled 2020-05-19: qty 5

## 2020-05-19 MED ORDER — LIDOCAINE-EPINEPHRINE 1 %-1:100000 IJ SOLN
INTRAMUSCULAR | Status: AC
  Filled 2020-05-19: qty 1

## 2020-05-19 MED ORDER — SODIUM CHLORIDE 0.9 % IV SOLN: INTRAVENOUS | Status: DC

## 2020-05-19 MED ORDER — FENTANYL CITRATE (PF) 100 MCG/2ML IJ SOLN
INTRAMUSCULAR | Status: AC | PRN
  Administered 2020-05-19 (×2): 50 ug via INTRAVENOUS

## 2020-05-19 MED ORDER — CEFAZOLIN SODIUM-DEXTROSE 2-4 GM/100ML-% IV SOLN
INTRAVENOUS | Status: AC
  Administered 2020-05-19: 2 g via INTRAVENOUS
  Filled 2020-05-19: qty 100

## 2020-05-19 MED ORDER — MIDAZOLAM HCL 2 MG/2ML IJ SOLN
INTRAMUSCULAR | Status: AC | PRN
  Administered 2020-05-19 (×2): 1 mg via INTRAVENOUS

## 2020-05-19 MED ORDER — FENTANYL CITRATE (PF) 100 MCG/2ML IJ SOLN
INTRAMUSCULAR | Status: DC
Start: 2020-05-19 — End: 2020-05-20
  Filled 2020-05-19: qty 2

## 2020-05-19 MED ORDER — HEPARIN SOD (PORK) LOCK FLUSH 100 UNIT/ML IV SOLN
INTRAVENOUS | Status: AC | PRN
  Administered 2020-05-19: 500 [IU] via INTRAVENOUS

## 2020-05-19 MED ORDER — CEFAZOLIN SODIUM-DEXTROSE 2-4 GM/100ML-% IV SOLN: 2.0000 g | INTRAVENOUS | Status: AC

## 2020-05-19 NOTE — Procedures (Signed)
Pre Procedure Dx: Poor venous access Post Procedural Dx: Same  Successful placement of right IJ approach port-a-cath with tip at the superior caval atrial junction. The catheter is ready for immediate use.  Estimated Blood Loss: Minimal  Complications: None immediate.  Jay Judine Arciniega, MD Pager #: [phone]   

## 2020-05-19 NOTE — Discharge Instructions (Addendum)
Urgent needs - IR on call MD [phone]  Wound - May remove dressing and shower in 24 to 48 hours.  Keep site clean and dry.  Replace with bandaid. Do not submerge in tub or water until site healing well.  If ordered by your provider, may start Emla cream in 2 weeks or after incision is healed.  After completion of treatment, your provider should have you set up for monthly port flushes.    Implanted Port Insertion, Care After This sheet gives you information about how to care for yourself after your procedure. Your health care provider may also give you more specific instructions. If you have problems or questions, contact your health care provider. What can I expect after the procedure? After the procedure, it is common to have:  Discomfort at the port insertion site.  Bruising on the skin over the port. This should improve over 3-4 days. Follow these instructions at home: Port care  After your port is placed, you will get a manufacturer's information card. The card has information about your port. Keep this card with you at all times.  Take care of the port as told by your health care provider. Ask your health care provider if you or a family member can get training for taking care of the port at home. A home health care nurse may also take care of the port.  Make sure to remember what type of port you have. Incision care  Follow instructions from your health care provider about how to take care of your port insertion site. Make sure you: ? Wash your hands with soap and water before and after you change your bandage (dressing). If soap and water are not available, use hand sanitizer. ? Change your dressing as told by your health care provider. ? Leave stitches (sutures), skin glue, or adhesive strips in place. These skin closures may need to stay in place for 2 weeks or longer. If adhesive strip edges start to loosen and curl up, you may trim the loose edges. Do not remove adhesive  strips completely unless your health care provider tells you to do that.  Check your port insertion site every day for signs of infection. Check for: ? Redness, swelling, or pain. ? Fluid or blood. ? Warmth. ? Pus or a bad smell. Activity  Return to your normal activities as told by your health care provider. Ask your health care provider what activities are safe for you.  Do not lift anything that is heavier than 10 lb (4.5 kg), or the limit that you are told, until your health care provider says that it is safe. General instructions  Take over-the-counter and prescription medicines only as told by your health care provider.  Do not take baths, swim, or use a hot tub until your health care provider approves. Ask your health care provider if you may take showers. You may only be allowed to take sponge baths.  Do not drive for 24 hours if you were given a sedative during your procedure.  Wear a medical alert bracelet in case of an emergency. This will tell any health care providers that you have a port.  Keep all follow-up visits as told by your health care provider. This is important. Contact a health care provider if:  You cannot flush your port with saline as directed, or you cannot draw blood from the port.  You have a fever or chills.  You have redness, swelling, or pain around your port   insertion site.  You have fluid or blood coming from your port insertion site.  Your port insertion site feels warm to the touch.  You have pus or a bad smell coming from the port insertion site. Get help right away if:  You have chest pain or shortness of breath.  You have bleeding from your port that you cannot control. Summary  Take care of the port as told by your health care provider. Keep the manufacturer's information card with you at all times.  Change your dressing as told by your health care provider.  Contact a health care provider if you have a fever or chills or if you  have redness, swelling, or pain around your port insertion site.  Keep all follow-up visits as told by your health care provider. This information is not intended to replace advice given to you by your health care provider. Make sure you discuss any questions you have with your health care provider. Document Revised: 04/04/2018 Document Reviewed: 04/04/2018 Elsevier Patient Education  2020 Elsevier Inc.   Moderate Conscious Sedation, Adult, Care After These instructions provide you with information about caring for yourself after your procedure. Your health care provider may also give you more specific instructions. Your treatment has been planned according to current medical practices, but problems sometimes occur. Call your health care provider if you have any problems or questions after your procedure. What can I expect after the procedure? After your procedure, it is common:  To feel sleepy for several hours.  To feel clumsy and have poor balance for several hours.  To have poor judgment for several hours.  To vomit if you eat too soon. Follow these instructions at home: For at least 24 hours after the procedure:  Do not: ? Participate in activities where you could fall or become injured. ? Drive. ? Use heavy machinery. ? Drink alcohol. ? Take sleeping pills or medicines that cause drowsiness. ? Make important decisions or sign legal documents. ? Take care of children on your own.  Rest. Eating and drinking  Follow the diet recommended by your health care provider.  If you vomit: ? Drink water, juice, or soup when you can drink without vomiting. ? Make sure you have little or no nausea before eating solid foods. General instructions  Have a responsible adult stay with you until you are awake and alert.  Take over-the-counter and prescription medicines only as told by your health care provider.  If you smoke, do not smoke without supervision.  Keep all follow-up  visits as told by your health care provider. This is important. Contact a health care provider if:  You keep feeling nauseous or you keep vomiting.  You feel light-headed.  You develop a rash.  You have a fever. Get help right away if:  You have trouble breathing. This information is not intended to replace advice given to you by your health care provider. Make sure you discuss any questions you have with your health care provider. Document Revised: 08/19/2017 Document Reviewed: 12/27/2015 Elsevier Patient Education  2020 Elsevier Inc.   

## 2020-05-19 NOTE — H&P (Signed)
Chief Complaint: Patient was seen in consultation today for port-a-catheter placement.   Referring Physician(s): Tanner,Van E.  Supervising Physician: Sandi Mariscal  Patient Status: [facility] - Out-pt  History of Present Illness: Connor Adams is a 74 y.o. male with a medical history significant for TIA, melanoma in situ, HTN and CKD III. He presented to his PCP for ongoing abdominal pain and worsening heartburn. A mass was palpated in his abdomen and imaging was ordered with findings concerning for lymphoma. 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   Interventional Radiology has been asked to evaluate this patient for an image-guided port-a-catheter placement to facilitate his treatment plans.     Past Medical History:  Diagnosis Date  . Arthritis   . Chronic kidney disease (CKD), stage III (moderate)   . GERD (gastroesophageal reflux disease)    occ  . Hemorrhoids   . History of ETT 3/05   low risk  . History of hiatal hernia   . History of kidney stones   . HLD (hyperlipidemia)   . HTN (hypertension)   . Left inguinal hernia   . Melanoma ([place])    excied  . 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 Stroke [facility]) 2011   tia   . TIA (transient ischemic attack) 4/11   associated w slurred speecha ndmild facial droop. lasted for about 10 minutes. Had MRI, etc with [facility] that per his report was ok (dr. Stephannie Li). Echo (4/11): EF 55-60%, no regional WMAs, normal diastolic function, mild LAE, no source of embolus    Past Surgical History:  Procedure Laterality Date  . BACK SURGERY    . BMI 30.2 muscular  12/08/04  . curretage actinic keratosis R ear  01/20/05  . ETT low prob of CAD  11/21/03  . excision melanoma in situ back  01/20/05  . hemorrhoids sclerosed at colonoscopy  12/19/05  . LAPAROSCOPIC INGUINAL HERNIA REPAIR  12/20/95  . retinal laser surgery  09/20/86  . SEPTOPLASTY  11/19/98  . TOTAL KNEE  ARTHROPLASTY Left 12/13/2016   Procedure: TOTAL KNEE ARTHROPLASTY WITH RIGHT KNEE CORTISONE INJECTION;  Surgeon: Frederik Pear, MD;  Location: [facility];  Service: Orthopedics;  Laterality: Left;  . uric acid 6.9  12/22/04  . x-ray l great toe  07/21/00    Allergies: Patient has no known allergies.  Medications: Prior to Admission medications   Medication Sig Start Date End Date Taking? Authorizing Provider  ARTIFICIAL TEAR OP Apply 1 drop to eye daily as needed (dry eyes).    [provider]  aspirin 325 MG tablet Take 1 tablet (325 mg total) by mouth 2 (two) times daily. Patient taking differently: Take 162.5 mg by mouth 2 (two) times daily.  12/13/16   Leighton Parody, PA-C  fluticasone (FLONASE) 50 MCG/ACT nasal spray Place 1 spray into both nostrils as needed for allergies.  09/16/15   [provider]  hydrocortisone (PROCTOSOL HC) 2.5 % rectal cream Place 1 application rectally daily as needed for hemorrhoids or itching.    [provider]  ibuprofen (ADVIL,MOTRIN) 200 MG tablet Take 400 mg by mouth daily as needed for headache or moderate pain. For  pain Patient not taking: Reported on 05/15/2020    [provider]  indomethacin (INDOCIN) 50 MG capsule Take 50 mg by mouth 3 (three) times daily as needed (gout flare).  Patient not taking: Reported on 05/15/2020 10/27/18  [provider]  loratadine (CLARITIN) 10 MG tablet Take 10 mg by mouth daily as needed for allergies.    [provider]  losartan (COZAAR) 25 MG tablet Take 25 mg by mouth daily.  04/09/20   [provider]  Multiple Vitamins-Minerals (MULTIVITAMINS THER. W/MINERALS) TABS Take 1 tablet by mouth daily.      [provider]  Omega-3 Fatty Acids (OMEGA 3 PO) Take 600 mg by mouth daily.    [provider]  ondansetron (ZOFRAN ODT) 8 MG disintegrating tablet Take 1 tablet (8 mg total) by mouth every 8 (eight) hours as needed for nausea or vomiting. 05/13/20    Tanner, Lyndon Code., PA-C  pantoprazole (PROTONIX) 40 MG tablet Take 1 tablet (40 mg total) by mouth 2 (two) times daily. 05/13/20   Tanner, Lyndon Code., PA-C  PROAIR HFA 108 [phone] Base) MCG/ACT inhaler Inhale 2 puffs into the lungs as needed for wheezing or shortness of breath.  09/16/15   [provider]  prochlorperazine (COMPAZINE) 10 MG tablet Take 1 tablet (10 mg total) by mouth every 6 (six) hours as needed for nausea or vomiting. 05/15/20   Orson Slick, MD  promethazine (PHENERGAN) 25 MG tablet Take 1 tablet (25 mg total) by mouth every 6 (six) hours as needed for nausea or vomiting. 05/13/20   Tanner, Lyndon Code., PA-C  pseudoephedrine (SUDAFED) 30 MG tablet Take 30 mg by mouth every 4 (four) hours as needed for congestion.    [provider]  rosuvastatin (CRESTOR) 5 MG tablet Take 5 mg by mouth every evening.     [provider]     Family History  Problem Relation Age of Onset  . Heart failure Brother 44       CABGx4  . Heart attack Mother 73  . Heart attack Father 50  . Asthma Son   . Diabetes Brother     Social History   Socioeconomic History  . Marital status: Married    Spouse name: [name]  . Number of children: 2  . Years of education: 35  . Highest education level: Not on file  Occupational History  . Occupation: Systems developer: [employer]  Tobacco Use  . Smoking status: Never Smoker  . Smokeless tobacco: Never Used  Substance and Sexual Activity  . Alcohol use: No  . Drug use: No  . Sexual activity: Not on file  Other Topics Concern  . Not on file  Social History Narrative   Married to [employer] at [employer].   Son, [name] married   Son, [name], Married.    Caffeine use: 1 cup coffee per day   Social Determinants of Health   Financial Resource Strain:   . Difficulty of Paying Living Expenses: Not on file  Food Insecurity:   . Worried About Charity fundraiser in the Last Year: Not on file  . Ran  Out of Food in the Last Year: Not on file  Transportation Needs:   . Lack of Transportation (Medical): Not on file  . Lack of Transportation (Non-Medical): Not on file  Physical Activity:   . Days of Exercise per Week: Not on file  . Minutes of Exercise per Session: Not on file  Stress:   . Feeling of Stress : Not on file  Social Connections:   . Frequency of Communication with Friends and Family: Not on file  . Frequency of Social Gatherings  with Friends and Family: Not on file  . Attends Religious Services: Not on file  . Active Member of Clubs or Organizations: Not on file  . Attends Archivist Meetings: Not on file  . Marital Status: Not on file    Review of Systems: A 12 point ROS discussed and pertinent positives are indicated in the HPI above.  All other systems are negative.  Review of Systems  Constitutional: Positive for appetite change.  Respiratory: Negative for cough and shortness of breath.   Cardiovascular: Negative for chest pain and leg swelling.  Gastrointestinal: Positive for abdominal pain, nausea and vomiting. Negative for diarrhea.       LUQ pain. Intermittent nausea/vomiting over the past few weeks.   Musculoskeletal: Negative for back pain.  Neurological: Negative for headaches.    Vital Signs: BP (!) 156/87   Pulse 62   Temp 98.3 F (36.8 C) (Oral)   Resp 18   SpO2 97%   Physical Exam Constitutional:      General: He is not in acute distress. HENT:     Mouth/Throat:     Mouth: Mucous membranes are moist.     Pharynx: Oropharynx is clear.  Cardiovascular:     Rate and Rhythm: Normal rate.     Pulses: Normal pulses.     Heart sounds: Normal heart sounds.  Pulmonary:     Effort: Pulmonary effort is normal.     Breath sounds: Normal breath sounds.  Abdominal:     General: Bowel sounds are normal.     Palpations: There is mass.     Tenderness: There is abdominal tenderness.     Comments: LUQ  Musculoskeletal:        General:  Normal range of motion.  Skin:    General: Skin is warm and dry.  Neurological:     Mental Status: He is alert and oriented to person, place, and time.     Imaging: NM PET Image Initial (PI) Skull Base To Thigh  Result Date: 04/28/2020 CLINICAL DATA:  Initial treatment strategy for mesenteric nodal mass. Reported history of melanoma. EXAM: NUCLEAR MEDICINE PET SKULL BASE TO THIGH TECHNIQUE: 9.1 mCi F-18 FDG was injected intravenously. Full-ring PET imaging was performed from the skull base to thigh after the radiotracer. CT data was obtained and used for attenuation correction and anatomic localization. Fasting blood glucose: 109 mg/dl COMPARISON:  CT abdomen 04/17/2020 FINDINGS: Mediastinal blood pool activity: SUV max 2.1 Liver activity: SUV max 2.6 NECK: Symmetric activity in the vicinity of the pharyngoesophageal junction, maximum SUV 13.0. Symmetric glottic activity. This regional activity is likely physiologic. Incidental CT findings: none CHEST: Hypermetabolic lymph node in the right pericardial adipose tissue adjacent to the diaphragm, 0.6 cm in short axis on image 101/4, maximum SUV 7.4, Deauville 5. Activity along the inferior margin of the right first costosternal joint probably related to arthropathy, less likely a small internal mammary lymph node, maximum SUV 3.4, Deauville 4. Left basilar juxta diaphragmatic lymph node along the pleural adipose tissue with maximum SUV 5.1, Deauville 4. Subtle nodularity along the minor fissure about 0.3 cm in diameter without accentuated metabolic activity. Clustered/branching nodularity peripherally in the left upper lobe with individual nodules up to 0.5 cm in diameter on image 25/8, maximum SUV 1.1. The branching pattern tends to favor atypical infectious bronchiolitis. Subpleural plaque or nodularity along the left hemidiaphragm 0.5 cm, maximum SUV 2.3 (Deauville 3). Additional adjacent mild pleural-based nodularity as on the prior exam. Incidental CT  findings: Coronary, aortic arch, and branch vessel atherosclerotic vascular disease. Calcified right paratracheal lymph nodes compatible with old granulomatous disease. ABDOMEN/PELVIS: The 13.0 by 10.7 cm left mesenteric mass on image 128/4 has maximum SUV of 21.2, Deauville 5. Hypermetabolic porta hepatis and retroperitoneal adenopathy observed. A left periaortic lymph node measures 1.3 cm in short axis on image 142/4 (formerly 1.2 cm by my measurements) and has a maximum SUV of 18.9, Deauville 5. A right inguinal lymph node measuring 0.9 cm in short axis on image 192/4 has maximum SUV of 3.4, Deauville 4. There are some scattered additional small mesenteric lymph nodes with only low-grade activity. Mild mesenteric edema associated with the mesenteric mass. No splenomegaly or significant hypermetabolic splenic lesion is observed. Incidental CT findings: Aortoiliac atherosclerotic vascular disease. Bilateral nonobstructive nephrolithiasis. Retroperitoneal and mesenteric stranding primarily related to the large mesenteric mass. Small amount of pelvic ascites. Mild prostatomegaly. SKELETON: No significant abnormal hypermetabolic activity in this region. 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 Cervical spine plate and screw fixator. IMPRESSION: 1. Large solid left mesenteric mass 13.0 cm with maximum SUV of 21.2, Deauville 5. 2. Hypermetabolic porta hepatis and retroperitoneal lymph nodes along with small but hypermetabolic juxtadiaphragmatic/pericardial lymph nodes in the lower chest, generally Deauville 4 and Deauville 5. 3. Branching nodularity in the left upper lobe, Deauville 2 activity, probably from atypical infectious bronchiolitis. 4. Subpleural plaque/nodularity along the left hemidiaphragm with Deauville 3 activity. 5. Accentuated activity at the pharyngoesophageal junction, most likely physiologic. 6.  Other imaging findings of potential clinical significance: Aortic Atherosclerosis (ICD10-I70.0). Coronary atherosclerosis. 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 Bilateral nonobstructive nephrolithiasis. Small amount of pelvic ascites. Mild prostatomegaly. Electronically Signed   By: Van Clines M.D.   On: 04/28/2020 11:35   CT Biopsy  Result Date: 05/08/2020 CLINICAL DATA:  13 cm left-sided mesenteric abdominal mass. EXAM: CT GUIDED CORE BIOPSY OF MESENTERIC MASS ANESTHESIA/SEDATION: 1.5 mg IV Versed; 75 mcg IV Fentanyl Total Moderate Sedation Time:  18 minutes. The patient's level of consciousness and physiologic status were continuously monitored during the procedure by Radiology nursing. PROCEDURE: The procedure risks, benefits, and alternatives were explained to the patient. Questions regarding the procedure were encouraged and answered. The patient understands and consents to the procedure. A time-out was performed prior to initiating the procedure. CT was performed through the abdomen in a supine position. The left abdominal wall was prepped with chlorhexidine in a sterile fashion, and a sterile drape was applied covering the operative field. A sterile gown and sterile gloves were used for the procedure. Local anesthesia was provided with 1% Lidocaine. Under CT guidance, a 17 gauge trocar needle was advanced to the level of a left-sided mesenteric mass. After confirming needle tip position, 4 separate coaxial 18 gauge core biopsy samples were obtained and submitted in saline. Some Gel-Foam pledgets were advanced through the outer needle as the needle was retracted and removed. COMPLICATIONS: None FINDINGS: Large left-sided intra-abdominal mass within the mesentery measures up to 13.3 cm in maximum diameter. Solid tissue was obtained. IMPRESSION: CT-guided core biopsy performed of large left-sided  mesenteric mass measuring just over 13 cm in estimated maximal diameter. Electronically Signed   By: Aletta Edouard M.D.   On: 05/08/2020 15:02    Labs:  CBC: Recent Labs    04/24/20 0928 05/08/20 0932 05/13/20 1113 05/15/20  1011  WBC 6.6 7.6 11.9* 10.8*  HGB 13.2 12.5* 11.8* 11.5*  HCT 40.8 39.2 35.5* 34.6*  PLT 248 297 305 294    COAGS: Recent Labs    05/08/20 0932  INR 1.1    BMP: Recent Labs    04/24/20 0928 05/13/20 1113 05/15/20 1011  NA 142 140 136  K 4.3 4.0 3.9  CL 105 104 101  CO2 27 25 28   GLUCOSE 99 105* 110*  BUN 20 28* 23  CALCIUM 10.3 9.6 9.4  CREATININE 1.28* 1.20 1.23  GFRNONAA 55* 60* 58*  GFRAA >60 >60 >60    LIVER FUNCTION TESTS: Recent Labs    04/24/20 0928 05/13/20 1113 05/15/20 1011  BILITOT 0.5 0.5 0.5  AST 20 30 26   ALT 15 21 18   ALKPHOS 70 66 63  PROT 7.7 6.8 6.5  ALBUMIN 3.7 3.3* 3.0*    TUMOR MARKERS: No results for input(s): AFPTM, CEA, CA199, CHROMGRNA in the last 8760 hours.  Assessment and Plan:  Diffuse Large B cell lymphoma: Connor Adams, 74 year old male, presents today to the [facility] department for an image-guided port-a-catheter placement.   Risks and benefits of image-guided Port-a-catheter placement were discussed with the patient including, but not limited to bleeding, infection, pneumothorax, or fibrin sheath development and need for additional procedures.  All of the patient's questions were answered, patient is agreeable to proceed.  The patient has been NPO since 0800 (one piece of toast). Vitals have been reviewed. The patient does not take any blood-thinning medications.   Consent signed and in chart.  Thank you for this interesting consult.  I greatly enjoyed meeting Connor Adams and look forward to participating in their care.  A copy of this report was sent to the requesting provider on this date.  Electronically Signed: Soyla Dryer,  AGACNP-BC [phone] 05/19/2020, 1:33 PM   I spent a total of 30 Minutes   in face to face in clinical consultation, greater than 50% of which was counseling/coordinating care for port-a-catheter placement.

## 2020-05-19 NOTE — Telephone Encounter (Signed)
Received call from pt's wife. She states that Connor Adams had a rough day on Saturday with nausea and vomiting.  He is better today and able to keep fluids down on Sunday and early this AM. Pt is scheduled for  [facility] placement today. Connor Adams is concerned about Connor Adams being NPO for awhile today due to port placement. She is concerned about him getting dehydrated. Advised that the staff in IR will be checking his BP etc and they can always ask for some IVFluid while he is there. Connor Adams and Connor Adams voiced understanding.

## 2020-05-20 ENCOUNTER — Ambulatory Visit: Payer: Medicare PPO | Admitting: Gastroenterology

## 2020-05-20 ENCOUNTER — Encounter: Payer: Self-pay | Admitting: Gastroenterology

## 2020-05-20 VITALS — BP 100/68 | HR 88 | Ht 68.5 in | Wt 171.2 lb

## 2020-05-20 DIAGNOSIS — R1013 Epigastric pain: Secondary | ICD-10-CM | POA: Diagnosis not present

## 2020-05-20 DIAGNOSIS — C8330 Diffuse large B-cell lymphoma, unspecified site: Secondary | ICD-10-CM

## 2020-05-20 DIAGNOSIS — R112 Nausea with vomiting, unspecified: Secondary | ICD-10-CM

## 2020-05-20 MED ORDER — SUCRALFATE 1 G PO TABS: 1.0000 g | ORAL_TABLET | Freq: Three times a day (TID) | ORAL | 3 refills | Status: DC

## 2020-05-20 NOTE — Patient Instructions (Signed)
You have been scheduled for an endoscopy. Please follow written instructions given to you at your visit today. If you use inhalers (even only as needed), please bring them with you on the day of your procedure.   Continue Protonix  We have sent carafate to your pharmacy  AVOID NSAIDS   Gastroesophageal Reflux Disease, Adult Gastroesophageal reflux (GER) happens when acid from the stomach flows up into the tube that connects the mouth and the stomach (esophagus). Normally, food travels down the esophagus and stays in the stomach to be digested. However, when a person has GER, food and stomach acid sometimes move back up into the esophagus. If this becomes a more serious problem, the person may be diagnosed with a disease called gastroesophageal reflux disease (GERD). GERD occurs when the reflux:  Happens often.  Causes frequent or severe symptoms.  Causes problems such as damage to the esophagus. When stomach acid comes in contact with the esophagus, the acid may cause soreness (inflammation) in the esophagus. Over time, GERD may create small holes (ulcers) in the lining of the esophagus. What are the causes? This condition is caused by a problem with the muscle between the esophagus and the stomach (lower esophageal sphincter, or LES). Normally, the LES muscle closes after food passes through the esophagus to the stomach. When the LES is weakened or abnormal, it does not close properly, and that allows food and stomach acid to go back up into the esophagus. The LES can be weakened by certain dietary substances, medicines, and medical conditions, including:  Tobacco use.  Pregnancy.  Having a hiatal hernia.  Alcohol use.  Certain foods and beverages, such as coffee, chocolate, onions, and peppermint. What increases the risk? You are more likely to develop this condition if you:  Have an increased body weight.  Have a connective tissue disorder.  Use NSAID medicines. What are  the signs or symptoms? Symptoms of this condition include:  Heartburn.  Difficult or painful swallowing.  The feeling of having a lump in the throat.  Abitter taste in the mouth.  Bad breath.  Having a large amount of saliva.  Having an upset or bloated stomach.  Belching.  Chest pain. Different conditions can cause chest pain. Make sure you see your health care provider if you experience chest pain.  Shortness of breath or wheezing.  Ongoing (chronic) cough or a night-time cough.  Wearing away of tooth enamel.  Weight loss. How is this diagnosed? Your health care provider will take a medical history and perform a physical exam. To determine if you have mild or severe GERD, your health care provider may also monitor how you respond to treatment. You may also have tests, including:  A test to examine your stomach and esophagus with a small camera (endoscopy).  A test thatmeasures the acidity level in your esophagus.  A test thatmeasures how much pressure is on your esophagus.  A barium swallow or modified barium swallow test to show the shape, size, and functioning of your esophagus. How is this treated? The goal of treatment is to help relieve your symptoms and to prevent complications. Treatment for this condition may vary depending on how severe your symptoms are. Your health care provider may recommend:  Changes to your diet.  Medicine.  Surgery. Follow these instructions at home: Eating and drinking   Follow a diet as recommended by your health care provider. This may involve avoiding foods and drinks such as: ? Coffee and tea (with or without  caffeine). ? Drinks that containalcohol. ? Energy drinks and sports drinks. ? Carbonated drinks or sodas. ? Chocolate and cocoa. ? Peppermint and mint flavorings. ? Garlic and onions. ? Horseradish. ? Spicy and acidic foods, including peppers, chili powder, curry powder, vinegar, hot sauces, and barbecue  sauce. ? Citrus fruit juices and citrus fruits, such as oranges, lemons, and limes. ? Tomato-based foods, such as red sauce, chili, salsa, and pizza with red sauce. ? Fried and fatty foods, such as donuts, french fries, potato chips, and high-fat dressings. ? High-fat meats, such as hot dogs and fatty cuts of red and white meats, such as rib eye steak, sausage, ham, and bacon. ? High-fat dairy items, such as whole milk, butter, and cream cheese.  Eat small, frequent meals instead of large meals.  Avoid drinking large amounts of liquid with your meals.  Avoid eating meals during the 2-3 hours before bedtime.  Avoid lying down right after you eat.  Do not exercise right after you eat. Lifestyle   Do not use any products that contain nicotine or tobacco, such as cigarettes, e-cigarettes, and chewing tobacco. If you need help quitting, ask your health care provider.  Try to reduce your stress by using methods such as yoga or meditation. If you need help reducing stress, ask your health care provider.  If you are overweight, reduce your weight to an amount that is healthy for you. Ask your health care provider for guidance about a safe weight loss goal. General instructions  Pay attention to any changes in your symptoms.  Take over-the-counter and prescription medicines only as told by your health care provider. Do not take aspirin, ibuprofen, or other NSAIDs unless your health care provider told you to do so.  Wear loose-fitting clothing. Do not wear anything tight around your waist that causes pressure on your abdomen.  Raise (elevate) the head of your bed about 6 inches (15 cm).  Avoid bending over if this makes your symptoms worse.  Keep all follow-up visits as told by your health care provider. This is important. Contact a health care provider if:  You have: ? New symptoms. ? Unexplained weight loss. ? Difficulty swallowing or it hurts to swallow. ? Wheezing or a persistent  cough. ? A hoarse voice.  Your symptoms do not improve with treatment. Get help right away if you:  Have pain in your arms, neck, jaw, teeth, or back.  Feel sweaty, dizzy, or light-headed.  Have chest pain or shortness of breath.  Vomit and your vomit looks like blood or coffee grounds.  Faint.  Have stool that is bloody or black.  Cannot swallow, drink, or eat. Summary  Gastroesophageal reflux happens when acid from the stomach flows up into the esophagus. GERD is a disease in which the reflux happens often, causes frequent or severe symptoms, or causes problems such as damage to the esophagus.  Treatment for this condition may vary depending on how severe your symptoms are. Your health care provider may recommend diet and lifestyle changes, medicine, or surgery.  Contact a health care provider if you have new or worsening symptoms.  Take over-the-counter and prescription medicines only as told by your health care provider. Do not take aspirin, ibuprofen, or other NSAIDs unless your health care provider told you to do so.  Keep all follow-up visits as told by your health care provider. This is important. This information is not intended to replace advice given to you by your health care provider. Make sure  you discuss any questions you have with your health care provider. Document Revised: 03/15/2018 Document Reviewed: 03/15/2018 Elsevier Patient Education  2020 [place] for Gastroesophageal Reflux Disease, Adult When you have gastroesophageal reflux disease (GERD), the foods you eat and your eating habits are very important. Choosing the right foods can help ease your discomfort. Think about working with a nutrition specialist (dietitian) to help you make good choices. What are tips for following this plan?  Meals  Choose healthy foods that are low in fat, such as fruits, vegetables, whole grains, low-fat dairy products, and lean meat, fish, and  poultry.  Eat small meals often instead of 3 large meals a day. Eat your meals slowly, and in a place where you are relaxed. Avoid bending over or lying down until 2-3 hours after eating.  Avoid eating meals 2-3 hours before bed.  Avoid drinking a lot of liquid with meals.  Cook foods using methods other than frying. Bake, grill, or broil food instead.  Avoid or limit: ? Chocolate. ? Peppermint or spearmint. ? Alcohol. ? Pepper. ? Black and decaffeinated coffee. ? Black and decaffeinated tea. ? Bubbly (carbonated) soft drinks. ? Caffeinated energy drinks and soft drinks.  Limit high-fat foods such as: ? Fatty meat or fried foods. ? Whole milk, cream, butter, or ice cream. ? Nuts and nut butters. ? Pastries, donuts, and sweets made with butter or shortening.  Avoid foods that cause symptoms. These foods may be different for everyone. Common foods that cause symptoms include: ? Tomatoes. ? Oranges, lemons, and limes. ? Peppers. ? Spicy food. ? Onions and garlic. ? Vinegar. Lifestyle  Maintain a healthy weight. Ask your doctor what weight is healthy for you. If you need to lose weight, work with your doctor to do so safely.  Exercise for at least 30 minutes for 5 or more days each week, or as told by your doctor.  Wear loose-fitting clothes.  Do not smoke. If you need help quitting, ask your doctor.  Sleep with the head of your bed higher than your feet. Use a wedge under the mattress or blocks under the bed frame to raise the head of the bed. Summary  When you have gastroesophageal reflux disease (GERD), food and lifestyle choices are very important in easing your symptoms.  Eat small meals often instead of 3 large meals a day. Eat your meals slowly, and in a place where you are relaxed.  Limit high-fat foods such as fatty meat or fried foods.  Avoid bending over or lying down until 2-3 hours after eating.  Avoid peppermint and spearmint, caffeine, alcohol, and  chocolate. This information is not intended to replace advice given to you by your health care provider. Make sure you discuss any questions you have with your health care provider. Document Revised: 12/28/2018 Document Reviewed: 10/12/2016 Elsevier Patient Education  [place].  I appreciate the  opportunity to care for you  Thank You   Harl Bowie , MD

## 2020-05-20 NOTE — Progress Notes (Signed)
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  He had Covid infection in Dec and since then his smell and taste is altered, also has severe nausea, frequent vomiting.  He had a severe episodes with recurrent vomiting and subsequently had an episode of small volume hematemesis  He has been having increased abdominal discomfort, decreased appetite and also has lost significant weight  Colonoscopy 2018 by Dr Earlean Shawl: Normal Hemorrhoidal banding X3  PET CT 04/28/20: Large solid mesenteric mass 13 cm, hypermetabolic porta hepatis and retroperitoneal nodes   Outpatient Encounter Medications as of 05/20/2020  Medication Sig  . ARTIFICIAL TEAR OP Apply 1 drop to eye daily as needed (dry eyes).  . fluticasone (FLONASE) 50 MCG/ACT nasal spray Place 1 spray into both nostrils as needed for allergies.   . hydrocortisone (PROCTOSOL HC) 2.5 % rectal cream Place 1 application rectally daily as needed for hemorrhoids or itching.  Marland Kitchen ibuprofen (ADVIL,MOTRIN) 200 MG tablet Take 400 mg by mouth daily as needed for headache or moderate pain. For  pain  . loratadine (CLARITIN) 10 MG tablet Take 10 mg by mouth daily as needed for allergies.  Marland Kitchen losartan (COZAAR) 25 MG tablet Take 25 mg by mouth daily.   . Multiple Vitamins-Minerals (MULTIVITAMINS THER. W/MINERALS) TABS Take 1 tablet by mouth daily.    . Omega-3 Fatty Acids (OMEGA 3 PO) Take 600 mg by mouth daily.  . ondansetron (ZOFRAN ODT) 8 MG disintegrating tablet Take 1 tablet (8 mg total) by mouth every 8 (eight) hours as needed for nausea or vomiting.  . pantoprazole (PROTONIX) 40 MG tablet Take 1 tablet (40 mg total)  by mouth 2 (two) times daily.  Marland Kitchen PROAIR HFA 108 (90 Base) MCG/ACT inhaler Inhale 2 puffs into the lungs as needed for wheezing or shortness of breath.   . promethazine (PHENERGAN) 25 MG tablet Take 1 tablet (25 mg total) by mouth every 6 (six) hours as needed for nausea or vomiting.  . pseudoephedrine (SUDAFED) 30 MG tablet Take 30 mg by mouth every 4 (four) hours as needed for congestion.  . rosuvastatin (CRESTOR) 5 MG tablet Take 5 mg by mouth every evening.   Marland Kitchen tiZANidine (ZANAFLEX) 2 MG tablet Take by mouth every 6 (six) hours as needed for muscle spasms.  Marland Kitchen aspirin 325 MG tablet Take 1 tablet (325 mg total) by mouth 2 (two) times daily. (Patient not taking: Reported on 05/20/2020)  . indomethacin (INDOCIN) 50 MG capsule Take 50 mg by mouth 3 (three) times daily as needed (gout flare).  (Patient not taking: Reported on 05/15/2020)  . prochlorperazine (COMPAZINE) 10 MG tablet Take 1 tablet (10 mg total) by mouth every 6 (six) hours as needed for nausea or vomiting. (Patient not taking: Reported on 05/20/2020)   No facility-administered encounter medications on file as of 05/20/2020.    Allergies as of 05/20/2020  . (No Known Allergies)    Past Medical History:  Diagnosis Date  . Arthritis   . Chronic kidney disease (CKD), stage III (moderate)   . COVID-19   . GERD (gastroesophageal reflux disease)    occ  .  Hemorrhoids   . History of ETT 3/05   low risk  . History of hiatal hernia   . History of kidney stones   . HLD (hyperlipidemia)   . HTN (hypertension)   . Left inguinal hernia   . Melanoma ([place])    excied  . 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 Stroke [facility] 2011   tia   . TIA (transient ischemic attack) 4/11   associated w slurred speecha ndmild facial droop. lasted for about 10 minutes. Had MRI, etc with [facility] that per his report was ok (dr. Stephannie Li). Echo (4/11): EF 55-60%, no regional WMAs, normal diastolic function, mild LAE, no source of embolus     Past Surgical History:  Procedure Laterality Date  . BMI 30.2 muscular  12/08/04  . curretage actinic keratosis R ear  01/20/05  . ETT low prob of CAD  11/21/03  . excision melanoma in situ back  01/20/05  . hemorrhoids sclerosed at colonoscopy  12/19/05  . IR IMAGING GUIDED PORT INSERTION  05/19/2020  . LAPAROSCOPIC INGUINAL HERNIA REPAIR Left 12/20/1995  . LUMBAR DISC SURGERY    . NERVE REPAIR     back of neck  . retinal laser surgery Left 09/20/1986  . SEPTOPLASTY  11/19/98  . TOTAL KNEE ARTHROPLASTY Left 12/13/2016   Procedure: TOTAL KNEE ARTHROPLASTY WITH RIGHT KNEE CORTISONE INJECTION;  Surgeon: Frederik Pear, MD;  Location: [facility];  Service: Orthopedics;  Laterality: Left;  . uric acid 6.9  12/22/04  . x-ray l great toe  07/21/00    Family History  Problem Relation Age of Onset  . Liver cancer Sister   . Heart failure Brother 44       CABGx4  . Heart attack Mother 99  . Uterine cancer Mother   . Heart attack Father 55  . Asthma Son   . Diabetes Brother   . Prostate cancer Brother     Social History   Socioeconomic History  . Marital status: Married    Spouse name: [name]  . Number of children: 2  . Years of education: 66  . Highest education level: Not on file  Occupational History  . Occupation: Systems developer: [employer]  Tobacco Use  . Smoking status: Never Smoker  . Smokeless tobacco: Never Used  Vaping Use  . Vaping Use: Never used  Substance and Sexual Activity  . Alcohol use: No  . Drug use: No  . Sexual activity: Not on file  Other Topics Concern  . Not on file  Social History Narrative   Married to [employer] at [employer].   Son, [name] married   Son, [name], Married.    Caffeine use: 1 cup coffee per day   Social Determinants of Health   Financial Resource Strain:   . Difficulty of Paying Living Expenses: Not on file  Food Insecurity:   . Worried About Charity fundraiser in the Last Year: Not on file  . Ran  Out of Food in the Last Year: Not on file  Transportation Needs:   . Lack of Transportation (Medical): Not on file  . Lack of Transportation (Non-Medical): Not on file  Physical Activity:   . Days of Exercise per Week: Not on file  . Minutes of Exercise per Session: Not on file  Stress:   . Feeling of Stress : Not on file  Social Connections:   . Frequency of  Communication with Friends and Family: Not on file  . Frequency of Social Gatherings with Friends and Family: Not on file  . Attends Religious Services: Not on file  . Active Member of Clubs or Organizations: Not on file  . Attends Archivist Meetings: Not on file  . Marital Status: Not on file  Intimate Partner Violence:   . Fear of Current or Ex-Partner: Not on file  . Emotionally Abused: Not on file  . Physically Abused: Not on file  . Sexually Abused: Not on file      Review of systems: All other review of systems negative except as mentioned in the HPI.   Physical Exam: Vitals:   05/20/20 0830  BP: 100/68  Pulse: 88   Body mass index is 25.66 kg/m. Gen:      No acute distress HEENT:  sclera anicteric Abd:       + LUQ and mid abdomen palpable masses, no distension Ext:    No edema Neuro: alert and oriented x 3 Psych: normal mood and affect  Data Reviewed:  Reviewed labs, radiology imaging, old records and pertinent past GI work up   Assessment and Plan/Recommendations:  62 yr very pleasant male  with new diagnosis of large B cell lymphoma with epigastric pain, nausea, vomiting and decreased appetite  Small volume hematemesis: likely secondary to erosive esophagitis vs Mallory weis tear.  Will need to exclude peptic ulcer disease or invasion of lymphoma as is in close proximity to his stomach Schedule for EGD for further evaluation  Continue Protonix daily Carafate 1 g before meals and at bedtime as needed Avoid NSAIDs  Nausea and vomiting, decreased appetite and weight loss secondary to  lymphoma with significant bulky disease Discussed small frequent meals with high-calorie high-protein diet  Return as needed  The risks and benefits as well as alternatives of endoscopic procedure(s) have been discussed and reviewed. All questions answered. The patient agrees to proceed.   The patient was provided an opportunity to ask questions and all were answered. The patient agreed with the plan and demonstrated an understanding of the instructions.  Damaris Hippo , MD    CC: Shirline Frees, MD

## 2020-05-21 ENCOUNTER — Ambulatory Visit (AMBULATORY_SURGERY_CENTER): Payer: Medicare PPO | Admitting: Gastroenterology

## 2020-05-21 ENCOUNTER — Other Ambulatory Visit: Payer: Self-pay

## 2020-05-21 ENCOUNTER — Encounter: Payer: Self-pay | Admitting: Gastroenterology

## 2020-05-21 VITALS — BP 102/52 | HR 64 | Temp 98.6°F | Resp 18 | Ht 68.0 in | Wt 171.0 lb

## 2020-05-21 DIAGNOSIS — K317 Polyp of stomach and duodenum: Secondary | ICD-10-CM

## 2020-05-21 DIAGNOSIS — R1013 Epigastric pain: Secondary | ICD-10-CM

## 2020-05-21 DIAGNOSIS — K2100 Gastro-esophageal reflux disease with esophagitis, without bleeding: Secondary | ICD-10-CM

## 2020-05-21 DIAGNOSIS — K259 Gastric ulcer, unspecified as acute or chronic, without hemorrhage or perforation: Secondary | ICD-10-CM | POA: Diagnosis not present

## 2020-05-21 DIAGNOSIS — R109 Unspecified abdominal pain: Secondary | ICD-10-CM | POA: Diagnosis not present

## 2020-05-21 DIAGNOSIS — K920 Hematemesis: Secondary | ICD-10-CM

## 2020-05-21 DIAGNOSIS — K2970 Gastritis, unspecified, without bleeding: Secondary | ICD-10-CM | POA: Diagnosis not present

## 2020-05-21 DIAGNOSIS — C8333 Diffuse large B-cell lymphoma, intra-abdominal lymph nodes: Secondary | ICD-10-CM

## 2020-05-21 DIAGNOSIS — R112 Nausea with vomiting, unspecified: Secondary | ICD-10-CM

## 2020-05-21 MED ORDER — SODIUM CHLORIDE 0.9 % IV SOLN: 500.0000 mL | INTRAVENOUS | Status: DC

## 2020-05-21 NOTE — Progress Notes (Signed)
Called to room to assist during endoscopic procedure.  Patient ID and intended procedure confirmed with present staff. Received instructions for my participation in the procedure from the performing physician.  

## 2020-05-21 NOTE — Patient Instructions (Signed)
Thank you for allowing [place] to care for you today!  Await pathology results by mail, approximately 7-10 days.  Resume previous diet and medications.  Practice anti-reflux measures, handout provided.  Return to your normal activities tomorrow, 9/2.2021      YOU HAD AN ENDOSCOPIC PROCEDURE TODAY AT [facility] [place]:   Refer to the procedure report that was given to you for any specific questions about what was found during the examination.  If the procedure report does not answer your questions, please call your gastroenterologist to clarify.  If you requested that your care partner not be given the details of your procedure findings, then the procedure report has been included in a sealed envelope for you to review at your convenience later.  YOU SHOULD EXPECT: Some feelings of bloating in the abdomen. Passage of more gas than usual.  Walking can help get rid of the air that was put into your GI tract during the procedure and reduce the bloating. If you had a lower endoscopy (such as a colonoscopy or flexible sigmoidoscopy) you may notice spotting of blood in your stool or on the toilet paper. If you underwent a bowel prep for your procedure, you may not have a normal bowel movement for a few days.  Please Note:  You might notice some irritation and congestion in your nose or some drainage.  This is from the oxygen used during your procedure.  There is no need for concern and it should clear up in a day or so.  SYMPTOMS TO REPORT IMMEDIATELY:     Following upper endoscopy (EGD)  Vomiting of blood or coffee ground material  New chest pain or pain under the shoulder blades  Painful or persistently difficult swallowing  New shortness of breath  Fever of 100F or higher  Black, tarry-looking stools  For urgent or emergent issues, a gastroenterologist can be reached at any hour by calling [phone]. Do not use MyChart messaging for urgent concerns.    DIET:  We do  recommend a small meal at first, but then you may proceed to your regular diet.  Drink plenty of fluids but you should avoid alcoholic beverages for 24 hours.  ACTIVITY:  You should plan to take it easy for the rest of today and you should NOT DRIVE or use heavy machinery until tomorrow (because of the sedation medicines used during the test).    FOLLOW UP: Our staff will call the number listed on your records 48-72 hours following your procedure to check on you and address any questions or concerns that you may have regarding the information given to you following your procedure. If we do not reach you, we will leave a message.  We will attempt to reach you two times.  During this call, we will ask if you have developed any symptoms of COVID 19. If you develop any symptoms (ie: fever, flu-like symptoms, shortness of breath, cough etc.) before then, please call [phone].  If you test positive for Covid 19 in the 2 weeks post procedure, please call and report this information to [place].    If any biopsies were taken you will be contacted by phone or by letter within the next 1-3 weeks.  Please call us at [phone] if you have not heard about the biopsies in 3 weeks.    SIGNATURES/CONFIDENTIALITY: You and/or your care partner have signed paperwork which will be entered into your electronic medical record.  These signatures attest to the fact  that that the information above on your After Visit Summary has been reviewed and is understood.  Full responsibility of the confidentiality of this discharge information lies with you and/or your care-partner.

## 2020-05-21 NOTE — Progress Notes (Signed)
Vs CW ° °

## 2020-05-21 NOTE — Op Note (Signed)
[place] Patient Name: Connor Adams Procedure Date: 05/21/2020 2:59 PM MRN: [id number] Endoscopist: Mauri Pole , MD Age: 74 Referring MD:  Date of Birth: [birth year] Gender: Male Account #: [id number] Procedure:                Upper GI endoscopy Indications:              Persistent vomiting of unknown cause, Epigastric                            abdominal pain, Esophageal reflux symptoms that                            persist despite appropriate therapy, Abnormal PET                            scan of the GI tract Medicines:                Monitored Anesthesia Care Procedure:                Pre-Anesthesia Assessment:                           - Prior to the procedure, a History and Physical                            was performed, and patient medications and                            allergies were reviewed. The patient's tolerance of                            previous anesthesia was also reviewed. The risks                            and benefits of the procedure and the sedation                            options and risks were discussed with the patient.                            All questions were answered, and informed consent                            was obtained. Prior Anticoagulants: The patient has                            taken no previous anticoagulant or antiplatelet                            agents. ASA Grade Assessment: III - A patient with                            severe systemic disease. After reviewing the risks  and benefits, the patient was deemed in                            satisfactory condition to undergo the procedure.                           After obtaining informed consent, the endoscope was                            passed under direct vision. Throughout the                            procedure, the patient's blood pressure, pulse, and                            oxygen saturations were monitored  continuously. The                            Endoscope was introduced through the mouth, and                            advanced to the second part of duodenum. The upper                            GI endoscopy was technically difficult and complex                            due to presence of food. The patient tolerated the                            procedure well. Scope In: Scope Out: Findings:                 LA Grade B (one or more mucosal breaks greater than                            5 mm, not extending between the tops of two mucosal                            folds) esophagitis was found 34 to 36 cm from the                            incisors.                           A large amount of food (residue) was found in the                            gastric body.                           Retained fluid was found in the gastric body. Fluid  aspiration was performed through the scope suction                            channel. The amount of fluid collected was >2L.                           Scattered moderate inflammation characterized by                            congestion (edema), erythema and friability was                            found in the entire examined stomach. Biopsies were                            taken with a cold forceps for histology.                           No evidence of pyloric stenosis or gastric outlet                            obstruction                           The examined duodenum was normal. Complications:            No immediate complications. Estimated Blood Loss:     Estimated blood loss was minimal. Impression:               - LA Grade B reflux esophagitis.                           - A large amount of food (residue) in the stomach.                            No evidence of gastric outlet obstruction                           - Retained gastric fluid. Fluid aspiration                            performed.                            - Gastritis. Biopsied.                           - Normal examined duodenum. Recommendation:           - Resume previous diet.                           - Continue present medications.                           - Await pathology results.                           -  Follow an antireflux regimen. Mauri Pole, MD 05/21/2020 3:35:29 PM This report has been signed electronically.

## 2020-05-21 NOTE — Progress Notes (Signed)
PT taken to PACU. Monitors in place. VSS. Report given to RN. 

## 2020-05-22 ENCOUNTER — Encounter (HOSPITAL_COMMUNITY): Payer: Self-pay | Admitting: Hematology and Oncology

## 2020-05-22 ENCOUNTER — Telehealth: Payer: Self-pay | Admitting: Hematology and Oncology

## 2020-05-22 ENCOUNTER — Other Ambulatory Visit: Payer: Self-pay | Admitting: Hematology and Oncology

## 2020-05-22 ENCOUNTER — Ambulatory Visit (HOSPITAL_COMMUNITY)
Admission: RE | Admit: 2020-05-22 | Discharge: 2020-05-22 | Disposition: A | Discharge status: Home / Self Care | Payer: Medicare PPO | Source: Ambulatory Visit | Attending: Hematology and Oncology | Admitting: Hematology and Oncology

## 2020-05-22 DIAGNOSIS — E785 Hyperlipidemia, unspecified: Secondary | ICD-10-CM | POA: Insufficient documentation

## 2020-05-22 DIAGNOSIS — Z01818 Encounter for other preprocedural examination: Secondary | ICD-10-CM | POA: Insufficient documentation

## 2020-05-22 DIAGNOSIS — C8333 Diffuse large B-cell lymphoma, intra-abdominal lymph nodes: Secondary | ICD-10-CM | POA: Insufficient documentation

## 2020-05-22 DIAGNOSIS — Z0189 Encounter for other specified special examinations: Secondary | ICD-10-CM

## 2020-05-22 DIAGNOSIS — K219 Gastro-esophageal reflux disease without esophagitis: Secondary | ICD-10-CM | POA: Insufficient documentation

## 2020-05-22 DIAGNOSIS — I10 Essential (primary) hypertension: Secondary | ICD-10-CM | POA: Insufficient documentation

## 2020-05-22 LAB — ECHOCARDIOGRAM COMPLETE
Area-P 1/2: 2.22 cm2
S' Lateral: 3.2 cm

## 2020-05-22 NOTE — Telephone Encounter (Signed)
Called Mr. Branscome to discuss the final results of his pathology. 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 Given these findings he would need to be treated with dose adjusted R-EPOCH in the inpatient setting rather than standard R-CHOP chemotherapy. During her prior visit we had discussed the backbone of R-CHOP chemotherapy and the possibility of inpatient treatment. He voiced his understanding of the plan moving forward.  At this time we are currently going to try to get the patient started on 05/27/2020 with inpatient dose adjusted R-EPOCH with the rituximab to be administered on Monday along with his filgrastim shot.  The patient has successfully completed all other steps that we require including port placement, echocardiogram, and EGD which found 2 L of retained fluid in the stomach and drained it with marked improvement in the patient's abdominal discomfort.  [name], MD Department of Hematology/Oncology [facility] Phone: [phone] Pager: [phone] Email: [email or web address]

## 2020-05-22 NOTE — Progress Notes (Signed)
  Echocardiogram 2D Echocardiogram has been performed.  Connor Adams 05/22/2020, 12:35 PM

## 2020-05-23 ENCOUNTER — Telehealth: Payer: Self-pay | Admitting: *Deleted

## 2020-05-23 ENCOUNTER — Telehealth: Payer: Self-pay

## 2020-05-23 NOTE — Telephone Encounter (Signed)
  Follow up Call-  Call back number 05/21/2020  Post procedure Call Back phone  # [phone]  Permission to leave phone message Yes  Some recent data might be hidden     Patient questions:  Do you have a fever, pain , or abdominal swelling? No. Pain Score  0 *  Have you tolerated food without any problems? Yes.    Have you been able to return to your normal activities? Yes.    Do you have any questions about your discharge instructions: Diet   No. Medications  No. Follow up visit  No.  Do you have questions or concerns about your Care? No.  Actions: * If pain score is 4 or above: No action needed, pain <4.  1. Have you developed a fever since your procedure? no  2.   Have you had an respiratory symptoms (SOB or cough) since your procedure? no  3.   Have you tested positive for COVID 19 since your procedure no  4.   Have you had any family members/close contacts diagnosed with the COVID 19 since your procedure?  no   If yes to any of these questions please route to Joylene John, RN and Joella Prince, RN

## 2020-05-23 NOTE — Telephone Encounter (Signed)
NO ANSWER, MESSAGE LEFT FOR PATIENT. 

## 2020-05-23 NOTE — Telephone Encounter (Signed)
TCT patient and spoke with his wife, [name]. Patient was with her as we talked.  Informed of the plans for in patient admission for Lorenza's chemo on 05/27/20. Advised that all was in place. Informed that he has an appt for covid test to be done tomorrow @  [employer] office.  Address provided.  He is to arrive @ 9:45 for drive through testing.  Advised that Bed Placement at [facility] will call them Tuesday morning when his bed  Is ready. They are to leave after that call, immediately so the admission process can begin.  [name] and Deniro voiced understanding.  Advised to call with any questions @ [phone].  Email sent to  "In-Patient chemotherapy 'group.

## 2020-05-24 ENCOUNTER — Other Ambulatory Visit: Payer: Self-pay | Admitting: Hematology and Oncology

## 2020-05-24 ENCOUNTER — Other Ambulatory Visit (HOSPITAL_COMMUNITY)
Admission: RE | Admit: 2020-05-24 | Discharge: 2020-05-24 | Disposition: A | Discharge status: Home / Self Care | Payer: Medicare PPO | Source: Ambulatory Visit | Attending: Hematology and Oncology | Admitting: Hematology and Oncology

## 2020-05-24 DIAGNOSIS — Z01812 Encounter for preprocedural laboratory examination: Secondary | ICD-10-CM | POA: Insufficient documentation

## 2020-05-24 DIAGNOSIS — C8330 Diffuse large B-cell lymphoma, unspecified site: Secondary | ICD-10-CM | POA: Insufficient documentation

## 2020-05-24 DIAGNOSIS — C8333 Diffuse large B-cell lymphoma, intra-abdominal lymph nodes: Secondary | ICD-10-CM | POA: Diagnosis present

## 2020-05-24 DIAGNOSIS — Z96652 Presence of left artificial knee joint: Secondary | ICD-10-CM | POA: Diagnosis present

## 2020-05-24 DIAGNOSIS — M1990 Unspecified osteoarthritis, unspecified site: Secondary | ICD-10-CM | POA: Diagnosis not present

## 2020-05-24 DIAGNOSIS — E785 Hyperlipidemia, unspecified: Secondary | ICD-10-CM | POA: Diagnosis present

## 2020-05-24 DIAGNOSIS — Z79899 Other long term (current) drug therapy: Secondary | ICD-10-CM | POA: Diagnosis not present

## 2020-05-24 DIAGNOSIS — I129 Hypertensive chronic kidney disease with stage 1 through stage 4 chronic kidney disease, or unspecified chronic kidney disease: Secondary | ICD-10-CM | POA: Diagnosis not present

## 2020-05-24 DIAGNOSIS — Z8616 Personal history of COVID-19: Secondary | ICD-10-CM | POA: Diagnosis not present

## 2020-05-24 DIAGNOSIS — Z8673 Personal history of transient ischemic attack (TIA), and cerebral infarction without residual deficits: Secondary | ICD-10-CM | POA: Diagnosis not present

## 2020-05-24 DIAGNOSIS — Z800 Family history of malignant neoplasm of digestive organs: Secondary | ICD-10-CM | POA: Diagnosis not present

## 2020-05-24 DIAGNOSIS — Z87442 Personal history of urinary calculi: Secondary | ICD-10-CM | POA: Diagnosis not present

## 2020-05-24 DIAGNOSIS — I10 Essential (primary) hypertension: Secondary | ICD-10-CM | POA: Diagnosis present

## 2020-05-24 DIAGNOSIS — Z8042 Family history of malignant neoplasm of prostate: Secondary | ICD-10-CM | POA: Diagnosis not present

## 2020-05-24 DIAGNOSIS — Z5111 Encounter for antineoplastic chemotherapy: Secondary | ICD-10-CM | POA: Diagnosis not present

## 2020-05-24 DIAGNOSIS — Z8249 Family history of ischemic heart disease and other diseases of the circulatory system: Secondary | ICD-10-CM | POA: Diagnosis not present

## 2020-05-24 DIAGNOSIS — R918 Other nonspecific abnormal finding of lung field: Secondary | ICD-10-CM | POA: Diagnosis not present

## 2020-05-24 DIAGNOSIS — K219 Gastro-esophageal reflux disease without esophagitis: Secondary | ICD-10-CM | POA: Diagnosis present

## 2020-05-24 DIAGNOSIS — Z20822 Contact with and (suspected) exposure to covid-19: Secondary | ICD-10-CM | POA: Diagnosis present

## 2020-05-24 DIAGNOSIS — N183 Chronic kidney disease, stage 3 unspecified: Secondary | ICD-10-CM | POA: Diagnosis not present

## 2020-05-24 DIAGNOSIS — I700 Atherosclerosis of aorta: Secondary | ICD-10-CM | POA: Diagnosis not present

## 2020-05-24 DIAGNOSIS — N400 Enlarged prostate without lower urinary tract symptoms: Secondary | ICD-10-CM | POA: Diagnosis not present

## 2020-05-24 DIAGNOSIS — Z833 Family history of diabetes mellitus: Secondary | ICD-10-CM | POA: Diagnosis not present

## 2020-05-24 DIAGNOSIS — Z87448 Personal history of other diseases of urinary system: Secondary | ICD-10-CM | POA: Diagnosis not present

## 2020-05-24 DIAGNOSIS — Z95828 Presence of other vascular implants and grafts: Secondary | ICD-10-CM | POA: Diagnosis not present

## 2020-05-24 LAB — SARS CORONAVIRUS 2 (TAT 6-24 HRS): SARS Coronavirus 2: NEGATIVE

## 2020-05-24 NOTE — Progress Notes (Signed)
START ON PATHWAY REGIMEN - Lymphoma and CLL     A cycle is every 21 days:     Prednisone      Rituximab-xxxx      Etoposide      Doxorubicin      Vincristine      Cyclophosphamide      Filgrastim-xxxx   **Always confirm dose/schedule in your pharmacy ordering system**  Patient Characteristics: Double Hit Lymphoma, First Line Disease Type: Not Applicable Disease Type: Double Hit Lymphoma Disease Type: Not Applicable Line of therapy: First Line Intent of Therapy: Curative Intent, Discussed with Patient 

## 2020-05-24 NOTE — Progress Notes (Addendum)
ALERT: Recent Path[facility] Treatment decision is outdated. Please await next Path[facility] decision 

## 2020-05-24 NOTE — Progress Notes (Signed)
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  Patient Characteristics: Double Hit Lymphoma, First Line Disease Type: Not Applicable Disease Type: Double Hit Lymphoma Disease Type: Not Applicable Line of therapy: First Line  

## 2020-05-27 ENCOUNTER — Other Ambulatory Visit: Payer: Self-pay | Admitting: Hematology and Oncology

## 2020-05-27 ENCOUNTER — Inpatient Hospital Stay: Payer: Medicare PPO | Attending: Hematology and Oncology

## 2020-05-27 ENCOUNTER — Encounter (HOSPITAL_COMMUNITY): Payer: Self-pay | Admitting: Hematology and Oncology

## 2020-05-27 ENCOUNTER — Other Ambulatory Visit: Payer: Self-pay

## 2020-05-27 ENCOUNTER — Inpatient Hospital Stay (HOSPITAL_COMMUNITY)
Admission: AD | Admit: 2020-05-27 | Discharge: 2020-05-31 | DRG: 847 | Disposition: A | Discharge status: Home / Self Care | Payer: Medicare PPO | Source: Ambulatory Visit | Attending: Hematology and Oncology | Admitting: Hematology and Oncology

## 2020-05-27 DIAGNOSIS — K219 Gastro-esophageal reflux disease without esophagitis: Secondary | ICD-10-CM | POA: Diagnosis present

## 2020-05-27 DIAGNOSIS — Z5111 Encounter for antineoplastic chemotherapy: Secondary | ICD-10-CM | POA: Diagnosis not present

## 2020-05-27 DIAGNOSIS — I10 Essential (primary) hypertension: Secondary | ICD-10-CM | POA: Diagnosis present

## 2020-05-27 DIAGNOSIS — R918 Other nonspecific abnormal finding of lung field: Secondary | ICD-10-CM | POA: Insufficient documentation

## 2020-05-27 DIAGNOSIS — C8338 Diffuse large B-cell lymphoma, lymph nodes of multiple sites: Secondary | ICD-10-CM | POA: Insufficient documentation

## 2020-05-27 DIAGNOSIS — Z95828 Presence of other vascular implants and grafts: Secondary | ICD-10-CM

## 2020-05-27 DIAGNOSIS — Z8042 Family history of malignant neoplasm of prostate: Secondary | ICD-10-CM

## 2020-05-27 DIAGNOSIS — Z87448 Personal history of other diseases of urinary system: Secondary | ICD-10-CM

## 2020-05-27 DIAGNOSIS — I700 Atherosclerosis of aorta: Secondary | ICD-10-CM | POA: Diagnosis not present

## 2020-05-27 DIAGNOSIS — Z87442 Personal history of urinary calculi: Secondary | ICD-10-CM | POA: Diagnosis not present

## 2020-05-27 DIAGNOSIS — Z79899 Other long term (current) drug therapy: Secondary | ICD-10-CM

## 2020-05-27 DIAGNOSIS — Z8616 Personal history of COVID-19: Secondary | ICD-10-CM | POA: Diagnosis not present

## 2020-05-27 DIAGNOSIS — C8333 Diffuse large B-cell lymphoma, intra-abdominal lymph nodes: Secondary | ICD-10-CM | POA: Diagnosis present

## 2020-05-27 DIAGNOSIS — Z8249 Family history of ischemic heart disease and other diseases of the circulatory system: Secondary | ICD-10-CM | POA: Diagnosis not present

## 2020-05-27 DIAGNOSIS — I129 Hypertensive chronic kidney disease with stage 1 through stage 4 chronic kidney disease, or unspecified chronic kidney disease: Secondary | ICD-10-CM | POA: Diagnosis not present

## 2020-05-27 DIAGNOSIS — Z20822 Contact with and (suspected) exposure to covid-19: Secondary | ICD-10-CM | POA: Diagnosis present

## 2020-05-27 DIAGNOSIS — Z96652 Presence of left artificial knee joint: Secondary | ICD-10-CM | POA: Diagnosis present

## 2020-05-27 DIAGNOSIS — Z800 Family history of malignant neoplasm of digestive organs: Secondary | ICD-10-CM

## 2020-05-27 DIAGNOSIS — E785 Hyperlipidemia, unspecified: Secondary | ICD-10-CM | POA: Diagnosis present

## 2020-05-27 DIAGNOSIS — Z8673 Personal history of transient ischemic attack (TIA), and cerebral infarction without residual deficits: Secondary | ICD-10-CM | POA: Diagnosis not present

## 2020-05-27 DIAGNOSIS — Z833 Family history of diabetes mellitus: Secondary | ICD-10-CM

## 2020-05-27 DIAGNOSIS — C8330 Diffuse large B-cell lymphoma, unspecified site: Secondary | ICD-10-CM | POA: Diagnosis present

## 2020-05-27 DIAGNOSIS — M1990 Unspecified osteoarthritis, unspecified site: Secondary | ICD-10-CM | POA: Diagnosis not present

## 2020-05-27 DIAGNOSIS — N183 Chronic kidney disease, stage 3 unspecified: Secondary | ICD-10-CM | POA: Diagnosis not present

## 2020-05-27 DIAGNOSIS — N400 Enlarged prostate without lower urinary tract symptoms: Secondary | ICD-10-CM | POA: Diagnosis not present

## 2020-05-27 LAB — CBC WITH DIFFERENTIAL/PLATELET
Abs Immature Granulocytes: 0.04 10*3/uL (ref 0.00–0.07)
Basophils Absolute: 0.1 10*3/uL (ref 0.0–0.1)
Basophils Relative: 1 %
Eosinophils Absolute: 0.1 10*3/uL (ref 0.0–0.5)
Eosinophils Relative: 1 %
HCT: 35.9 % — ABNORMAL LOW (ref 39.0–52.0)
Hemoglobin: 11.7 g/dL — ABNORMAL LOW (ref 13.0–17.0)
Immature Granulocytes: 0 %
Lymphocytes Relative: 5 %
Lymphs Abs: 0.6 10*3/uL — ABNORMAL LOW (ref 0.7–4.0)
MCH: 29.5 pg (ref 26.0–34.0)
MCHC: 32.6 g/dL (ref 30.0–36.0)
MCV: 90.7 fL (ref 80.0–100.0)
Monocytes Absolute: 0.7 10*3/uL (ref 0.1–1.0)
Monocytes Relative: 6 %
Neutro Abs: 10.2 10*3/uL — ABNORMAL HIGH (ref 1.7–7.7)
Neutrophils Relative %: 87 %
Platelets: 363 10*3/uL (ref 150–400)
RBC: 3.96 MIL/uL — ABNORMAL LOW (ref 4.22–5.81)
RDW: 13.4 % (ref 11.5–15.5)
WBC: 11.7 10*3/uL — ABNORMAL HIGH (ref 4.0–10.5)
nRBC: 0 % (ref 0.0–0.2)

## 2020-05-27 LAB — COMPREHENSIVE METABOLIC PANEL
ALT: 33 U/L (ref 0–44)
AST: 28 U/L (ref 15–41)
Albumin: 3 g/dL — ABNORMAL LOW (ref 3.5–5.0)
Alkaline Phosphatase: 57 U/L (ref 38–126)
Anion gap: 11 (ref 5–15)
BUN: 19 mg/dL (ref 8–23)
CO2: 23 mmol/L (ref 22–32)
Calcium: 8.4 mg/dL — ABNORMAL LOW (ref 8.9–10.3)
Chloride: 103 mmol/L (ref 98–111)
Creatinine, Ser: 1.31 mg/dL — ABNORMAL HIGH (ref 0.61–1.24)
GFR calc Af Amer: >60 mL/min (ref >60)
GFR calc non Af Amer: 54 mL/min — ABNORMAL LOW (ref >60)
Glucose, Bld: 145 mg/dL — ABNORMAL HIGH (ref 70–99)
Potassium: 3.8 mmol/L (ref 3.5–5.1)
Sodium: 137 mmol/L (ref 135–145)
Total Bilirubin: 0.3 mg/dL (ref 0.3–1.2)
Total Protein: 6.6 g/dL (ref 6.5–8.1)

## 2020-05-27 LAB — URIC ACID: Uric Acid, Serum: 8.7 mg/dL — ABNORMAL HIGH (ref 3.7–8.6)

## 2020-05-27 LAB — SURGICAL PATHOLOGY

## 2020-05-27 LAB — LACTATE DEHYDROGENASE: LDH: 419 U/L — ABNORMAL HIGH (ref 98–192)

## 2020-05-27 MED ORDER — SUCRALFATE 1 G PO TABS: 1.0000 g | ORAL_TABLET | Freq: Three times a day (TID) | ORAL | Status: DC

## 2020-05-27 MED ORDER — ASPIRIN 325 MG PO TABS: 325.0000 mg | ORAL_TABLET | ORAL | Status: DC | PRN

## 2020-05-27 MED ORDER — SODIUM CHLORIDE 0.9% FLUSH
10.0000 mL | Freq: Two times a day (BID) | INTRAVENOUS | Status: DC
  Administered 2020-05-27 – 2020-05-28 (×2): 10 mL

## 2020-05-27 MED ORDER — HOT PACK MISC ONCOLOGY
1.0000 | Freq: Once | Status: DC | PRN
  Filled 2020-05-27: qty 1

## 2020-05-27 MED ORDER — ALLOPURINOL 300 MG PO TABS
300.0000 mg | ORAL_TABLET | Freq: Every day | ORAL | Status: DC
  Administered 2020-05-27 – 2020-05-31 (×5): 300 mg via ORAL
  Filled 2020-05-27 (×5): qty 1

## 2020-05-27 MED ORDER — SUCRALFATE 1 GM/10ML PO SUSP
1.0000 g | Freq: Three times a day (TID) | ORAL | Status: DC
  Administered 2020-05-27 – 2020-05-31 (×14): 1 g via ORAL
  Filled 2020-05-27 (×14): qty 10

## 2020-05-27 MED ORDER — PREDNISONE 50 MG PO TABS: 60.0000 mg/m2/d | ORAL_TABLET | Freq: Every day | ORAL | Status: DC

## 2020-05-27 MED ORDER — SODIUM CHLORIDE 0.9 % IV SOLN
INTRAVENOUS | Status: DC
Start: 2020-05-27 — End: 2020-05-31

## 2020-05-27 MED ORDER — SODIUM CHLORIDE 0.9 % IV SOLN
Freq: Once | INTRAVENOUS | Status: AC
  Administered 2020-05-27: 8 mg via INTRAVENOUS
  Filled 2020-05-27: qty 4

## 2020-05-27 MED ORDER — ENOXAPARIN SODIUM 40 MG/0.4ML ~~LOC~~ SOLN
40.0000 mg | SUBCUTANEOUS | Status: DC
  Administered 2020-05-27 – 2020-05-30 (×4): 40 mg via SUBCUTANEOUS
  Filled 2020-05-27 (×4): qty 0.4

## 2020-05-27 MED ORDER — ONDANSETRON 4 MG PO TBDP: 8.0000 mg | ORAL_TABLET | Freq: Three times a day (TID) | ORAL | Status: DC | PRN

## 2020-05-27 MED ORDER — ROSUVASTATIN CALCIUM 5 MG PO TABS
5.0000 mg | ORAL_TABLET | Freq: Every evening | ORAL | Status: DC
  Administered 2020-05-28 – 2020-05-30 (×3): 5 mg via ORAL
  Filled 2020-05-27 (×3): qty 1

## 2020-05-27 MED ORDER — ADULT MULTIVITAMIN W/MINERALS CH
1.0000 | ORAL_TABLET | Freq: Every day | ORAL | Status: DC
  Administered 2020-05-28 – 2020-05-31 (×4): 1 via ORAL
  Filled 2020-05-27 (×4): qty 1

## 2020-05-27 MED ORDER — SODIUM BICARBONATE/SODIUM CHLORIDE MOUTHWASH
OROMUCOSAL | Status: DC | PRN
  Filled 2020-05-27: qty 1000

## 2020-05-27 MED ORDER — HYDROCORTISONE 2.5 % RE CREA
1.0000 "application " | TOPICAL_CREAM | Freq: Every day | RECTAL | Status: DC | PRN
  Filled 2020-05-27 (×2): qty 28.35

## 2020-05-27 MED ORDER — VINCRISTINE SULFATE CHEMO INJECTION 1 MG/ML
Freq: Once | INTRAVENOUS | Status: AC
Start: 2020-05-27 — End: 2020-05-28
  Filled 2020-05-27: qty 10

## 2020-05-27 MED ORDER — CHLORHEXIDINE GLUCONATE CLOTH 2 % EX PADS
6.0000 | MEDICATED_PAD | Freq: Every day | CUTANEOUS | Status: DC
  Administered 2020-05-28 – 2020-05-30 (×3): 6 via TOPICAL

## 2020-05-27 MED ORDER — SODIUM CHLORIDE 0.9% FLUSH: 10.0000 mL | INTRAVENOUS | Status: DC | PRN

## 2020-05-27 MED ORDER — PROCHLORPERAZINE MALEATE 10 MG PO TABS: 10.0000 mg | ORAL_TABLET | Freq: Four times a day (QID) | ORAL | Status: DC | PRN

## 2020-05-27 MED ORDER — PANTOPRAZOLE SODIUM 40 MG PO TBEC
40.0000 mg | DELAYED_RELEASE_TABLET | Freq: Two times a day (BID) | ORAL | Status: DC
  Administered 2020-05-27 – 2020-05-31 (×8): 40 mg via ORAL
  Filled 2020-05-27 (×8): qty 1

## 2020-05-27 MED ORDER — LOSARTAN POTASSIUM 50 MG PO TABS
25.0000 mg | ORAL_TABLET | Freq: Every day | ORAL | Status: DC
  Administered 2020-05-28 – 2020-05-31 (×4): 25 mg via ORAL
  Filled 2020-05-27 (×4): qty 1

## 2020-05-27 MED ORDER — COLD PACK MISC ONCOLOGY
1.0000 | Freq: Once | Status: DC | PRN
  Filled 2020-05-27: qty 1

## 2020-05-27 MED ORDER — PREDNISONE 50 MG PO TABS
120.0000 mg | ORAL_TABLET | Freq: Every day | ORAL | Status: AC
  Administered 2020-05-27 – 2020-05-31 (×5): 120 mg via ORAL
  Filled 2020-05-27 (×5): qty 1

## 2020-05-27 NOTE — Progress Notes (Signed)
Chemotherapy dosages, patient's BSA, total VTBI, 2 pharmacy signatures, expiration date, and patient identifiers independently verified by me and by Cline Crock, RN.

## 2020-05-27 NOTE — H&P (Signed)
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   1) 04/17/2020: CT A/P showed dominant nodal mass(11.5 x 10.9 cm)within the jejunal mesentery, highly suspicious for lymphoma. Additionally there are left lower lobe pleural-based pulmonary nodules 2) 04/24/2020: establish care with Dr. Lorenso Courier 3) 04/28/2020:  PET CT scan shows Large solid left mesenteric mass 13.0 cm with maximum SUV of 21.2, Deauville 5 with local hypermetabolic porta hepatis and retroperitoneal lymph nodes. Constitutes bulky Stage I disease.   Reason for Admission: double hit lymphoma, Cycle #1 EPOCH-R  HPI: Connor Adams 74 y.o. 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  On review of the previous records Connor Adams underwent a CT abdomen pelvis on 04/17/2020 at which time a nodal mass 11.5 x 10.9 cm was noted within the jejunal mesentery.  There were also left lower lobe pleural-based pulmonary nodules noted at that time as well as soft tissue fullness at the ileocecal valve.  Due to concern for this finding the patient was referred to oncology for further evaluation and management.  A PET scan was performed on 04/28/2020 showed a large solid left mesenteric mass 13.0 cm with maximum SUV of 21.2, Deauville 5, hypermetabolic porta hepatis and retroperitoneal lymph nodes along with small but hypermetabolic juxtadiaphragmatic/pericardial lymph nodes in the lower chest, generally Deauville 4 and Deauville 5. A biopsy of the mesenteric mass was performed on 05/08/2020 which was consistent with Large B-cell lymphoma. 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 It was recommend for him to begin chemotherapy with EPOCH-R.   In anticipation of chemotherapy, the patient had a PAC placed on 05/19/2020 and an echocardiogram completed on 05/22/2020 which showed an LVEF of 60-65%.  When seen today, the patient reports that he has some fatigue. He also reports a poor appetite and weight loss which he attributes to nausea and vomiting. He had nausea and vomiting yesterday, but none this morning. Bowels are moving without any difficulty. Denies fever, chills, headache, dizziness, chest pain, shortness of breath. Denies bleeding. The patient is seen today for admission to the hospital for cycle #1 EPOCH-R.     Past Medical History:  Diagnosis Date  . Arthritis   . Chronic kidney disease (CKD), stage III (moderate)   . COVID-19   . GERD (gastroesophageal reflux disease)    occ  . Hemorrhoids   . History of ETT 3/05   low risk  . History of hiatal hernia   . History of kidney stones   . HLD (hyperlipidemia)   . HTN (hypertension)   . Left inguinal hernia   . Melanoma ([place])    excied  . 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 Stroke [facility]) 2011   tia   . TIA (transient ischemic attack) 4/11   associated w slurred speecha ndmild facial droop. lasted for about 10 minutes. Had MRI, etc with [facility] that per his report was ok (dr. Stephannie Li). Echo (4/11): EF 55-60%, no regional WMAs, normal diastolic function, mild LAE, no source of embolus  :  Past Surgical History:  Procedure Laterality Date  . BMI 30.2 muscular  12/08/04  . curretage actinic keratosis R ear  01/20/05  . ETT low prob of CAD  11/21/03  . excision melanoma in situ back  01/20/05  . hemorrhoids sclerosed at colonoscopy  12/19/05  . IR IMAGING GUIDED PORT INSERTION  05/19/2020  . LAPAROSCOPIC INGUINAL HERNIA REPAIR Left 12/20/1995  . LUMBAR DISC SURGERY    . NERVE REPAIR     back of neck  . retinal laser surgery Left  09/20/1986  . SEPTOPLASTY  11/19/98  . TOTAL KNEE ARTHROPLASTY Left 12/13/2016   Procedure: TOTAL KNEE ARTHROPLASTY WITH RIGHT KNEE CORTISONE INJECTION;  Surgeon: Frederik Pear, MD;  Location: [place];  Service: Orthopedics;  Laterality: Left;  . uric acid 6.9  12/22/04  . x-ray l great toe  07/21/00  :  Current Facility-Administered Medications  Medication Dose Route Frequency Provider Last Rate Last Admin  . 0.9 %  sodium chloride infusion   Intravenous Continuous Orson Slick, MD 20 mL/hr at 05/27/20 1433 New Bag at 05/27/20 1433  . allopurinol (ZYLOPRIM) tablet 300 mg  300 mg Oral Daily [name] R, NP   300 mg at 05/27/20 1427  . aspirin tablet 325 mg  325 mg Oral Q4H PRN Maryanna Shape, NP      . Chlorhexidine Gluconate Cloth 2 % PADS 6 each  6 each Topical Daily Kylen Schliep, Roselie Awkward, NP      . Cold Pack 1 packet  1 packet Topical Once PRN [name] IV, MD      . DOXOrubicin (ADRIAMYCIN) 20 mg, etoposide (VEPESID) 96 mg, vinCRIStine (ONCOVIN) 0.8 mg in sodium chloride 0.9 % 1,000 mL chemo infusion   Intravenous Once Narda Rutherford T IV, MD      . enoxaparin (LOVENOX) injection 40 mg  40 mg Subcutaneous Q24H [name] R, NP   40 mg at 05/27/20 1428  . Hot Pack 1 packet  1 packet Topical Once PRN Orson Slick, MD      . hydrocortisone (ANUSOL-HC) 2.5 % rectal cream 1 application  1 application Rectal Daily PRN Maryanna Shape, NP      . Derrill Memo ON 05/28/2020] losartan (COZAAR) tablet 25 mg  25 mg Oral Daily Maryanna Shape, NP      . Derrill Memo ON 05/28/2020] multivitamin with minerals tablet 1 tablet  1 tablet Oral Daily Awad Gladd R, NP      . ondansetron (ZOFRAN) 8 mg, dexamethasone (DECADRON) 10 mg in sodium chloride 0.9 % 50 mL IVPB   Intravenous Once [name] IV, MD      . ondansetron (ZOFRAN-ODT) disintegrating tablet 8 mg  8 mg Oral Q8H PRN Duke Weisensel, Roselie Awkward, NP      . pantoprazole (PROTONIX) EC tablet 40 mg  40 mg Oral BID Beonka Amesquita R, NP      .  predniSONE (DELTASONE) tablet 120 mg  120 mg Oral Q breakfast [name] IV, MD      . prochlorperazine (COMPAZINE) tablet 10 mg  10 mg Oral Q6H PRN Maryanna Shape, NP      . Derrill Memo ON 05/28/2020] rosuvastatin (CRESTOR) tablet 5 mg  5 mg Oral QPM Reda Citron R, NP      . sodium bicarbonate/sodium chloride mouthwash 1038ml   Mouth Rinse PRN Honesti Seaberg R, NP      . sodium chloride flush (NS) 0.9 % injection 10-40 mL  10-40 mL Intracatheter Q12H Chenae Brager, Roselie Awkward, NP      .  sodium chloride flush (NS) 0.9 % injection 10-40 mL  10-40 mL Intracatheter PRN Aloysious Vangieson R, NP      . sucralfate (CARAFATE) 1 GM/10ML suspension 1 g  1 g Oral TID WC & HS Cherylanne Ardelean R, NP   1 g at 05/27/20 1427     No Known Allergies:  Family History  Problem Relation Age of Onset  . Liver cancer Sister   . Heart failure Brother 44       CABGx4  . Heart attack Mother 14  . Uterine cancer Mother   . Heart attack Father 80  . Asthma Son   . Diabetes Brother   . Prostate cancer Brother   :  Social History   Socioeconomic History  . Marital status: Married    Spouse name: [name]  . Number of children: 2  . Years of education: 13  . Highest education level: Not on file  Occupational History  . Occupation: Systems developer: [employer]  Tobacco Use  . Smoking status: Never Smoker  . Smokeless tobacco: Never Used  Vaping Use  . Vaping Use: Never used  Substance and Sexual Activity  . Alcohol use: No  . Drug use: No  . Sexual activity: Not on file  Other Topics Concern  . Not on file  Social History Narrative   Married to [employer] at [employer].   Son, [name] married   Son, [name], Married.    Caffeine use: 1 cup coffee per day   Social Determinants of Health   Financial Resource Strain:   . Difficulty of Paying Living Expenses: Not on file  Food Insecurity:   . Worried About Charity fundraiser in the Last Year: Not on file  . Ran Out of  Food in the Last Year: Not on file  Transportation Needs:   . Lack of Transportation (Medical): Not on file  . Lack of Transportation (Non-Medical): Not on file  Physical Activity:   . Days of Exercise per Week: Not on file  . Minutes of Exercise per Session: Not on file  Stress:   . Feeling of Stress : Not on file  Social Connections:   . Frequency of Communication with Friends and Family: Not on file  . Frequency of Social Gatherings with Friends and Family: Not on file  . Attends Religious Services: Not on file  . Active Member of Clubs or Organizations: Not on file  . Attends Archivist Meetings: Not on file  . Marital Status: Not on file  Intimate Partner Violence:   . Fear of Current or Ex-Partner: Not on file  . Emotionally Abused: Not on file  . Physically Abused: Not on file  . Sexually Abused: Not on file  :  Review of Systems: A comprehensive 14 point review of systems was negative except as noted in the HPI.  Exam: Patient Vitals for the past 24 hrs:  BP Temp Temp src Pulse Resp SpO2  05/27/20 1106 126/79 98 F (36.7 C) Oral 71 20 98 %    General:  well-nourished in no acute distress.   Eyes:  no scleral icterus.   ENT:  There were no oropharyngeal lesions.   Neck was without thyromegaly.   Lymphatics:  Negative cervical, supraclavicular or axillary adenopathy.   Respiratory: lungs were clear bilaterally without wheezing or crackles.   Cardiovascular:  Regular rate and rhythm, S1/S2, without murmur, rub or gallop.  There  was no pedal edema.   GI:  Positive bowel sounds. Palpable mass in the LUQ measures approximately 10 cm. Reports mild tenderness in this area.    Musculoskeletal:  no spinal tenderness of palpation of vertebral spine.   Skin exam was without echymosis, petichae.   Neuro exam was nonfocal. Patient was alert and oriented.  Attention was good.   Language was appropriate.  Mood was normal without depression.  Speech was not pressured.   Thought content was not tangential.     Lab Results  Component Value Date   WBC 11.7 (H) 05/27/2020   HGB 11.7 (L) 05/27/2020   HCT 35.9 (L) 05/27/2020   PLT 363 05/27/2020   GLUCOSE 145 (H) 05/27/2020   CHOL  12/31/2009    160        ATP III CLASSIFICATION:  <200     mg/dL   Desirable  200-239  mg/dL   Borderline High  >=240    mg/dL   High          TRIG 213 (H) 12/31/2009   HDL 28 (L) 12/31/2009   LDLCALC  12/31/2009    89        Total Cholesterol/HDL:CHD Risk Coronary Heart Disease Risk Table                     Men   Women  1/2 Average Risk   3.4   3.3  Average Risk       5.0   4.4  2 X Average Risk   9.6   7.1  3 X Average Risk  23.4   11.0        Use the calculated Patient Ratio above and the CHD Risk Table to determine the patient's CHD Risk.        ATP III CLASSIFICATION (LDL):  <100     mg/dL   Optimal  100-129  mg/dL   Near or Above                    Optimal  130-159  mg/dL   Borderline  160-189  mg/dL   High  >190     mg/dL   Very High   ALT 33 05/27/2020   AST 28 05/27/2020   NA 137 05/27/2020   K 3.8 05/27/2020   CL 103 05/27/2020   CREATININE 1.31 (H) 05/27/2020   BUN 19 05/27/2020   CO2 23 05/27/2020    NM PET Image Initial (PI) Skull Base To Thigh  Result Date: 04/28/2020 CLINICAL DATA:  Initial treatment strategy for mesenteric nodal mass. Reported history of melanoma. EXAM: NUCLEAR MEDICINE PET SKULL BASE TO THIGH TECHNIQUE: 9.1 mCi F-18 FDG was injected intravenously. Full-ring PET imaging was performed from the skull base to thigh after the radiotracer. CT data was obtained and used for attenuation correction and anatomic localization. Fasting blood glucose: 109 mg/dl COMPARISON:  CT abdomen 04/17/2020 FINDINGS: Mediastinal blood pool activity: SUV max 2.1 Liver activity: SUV max 2.6 NECK: Symmetric activity in the vicinity of the pharyngoesophageal junction, maximum SUV 13.0. Symmetric glottic activity. This regional activity is likely  physiologic. Incidental CT findings: none CHEST: Hypermetabolic lymph node in the right pericardial adipose tissue adjacent to the diaphragm, 0.6 cm in short axis on image 101/4, maximum SUV 7.4, Deauville 5. Activity along the inferior margin of the right first costosternal joint probably related to arthropathy, less likely a small internal mammary lymph node, maximum SUV 3.4, Deauville 4. Left basilar  juxta diaphragmatic lymph node along the pleural adipose tissue with maximum SUV 5.1, Deauville 4. Subtle nodularity along the minor fissure about 0.3 cm in diameter without accentuated metabolic activity. Clustered/branching nodularity peripherally in the left upper lobe with individual nodules up to 0.5 cm in diameter on image 25/8, maximum SUV 1.1. The branching pattern tends to favor atypical infectious bronchiolitis. Subpleural plaque or nodularity along the left hemidiaphragm 0.5 cm, maximum SUV 2.3 (Deauville 3). Additional adjacent mild pleural-based nodularity as on the prior exam. Incidental CT findings: Coronary, aortic arch, and branch vessel atherosclerotic vascular disease. Calcified right paratracheal lymph nodes compatible with old granulomatous disease. ABDOMEN/PELVIS: The 13.0 by 10.7 cm left mesenteric mass on image 128/4 has maximum SUV of 21.2, Deauville 5. Hypermetabolic porta hepatis and retroperitoneal adenopathy observed. A left periaortic lymph node measures 1.3 cm in short axis on image 142/4 (formerly 1.2 cm by my measurements) and has a maximum SUV of 18.9, Deauville 5. A right inguinal lymph node measuring 0.9 cm in short axis on image 192/4 has maximum SUV of 3.4, Deauville 4. There are some scattered additional small mesenteric lymph nodes with only low-grade activity. Mild mesenteric edema associated with the mesenteric mass. No splenomegaly or significant hypermetabolic splenic lesion is observed. Incidental CT findings: Aortoiliac atherosclerotic vascular disease. Bilateral  nonobstructive nephrolithiasis. Retroperitoneal and mesenteric stranding primarily related to the large mesenteric mass. Small amount of pelvic ascites. Mild prostatomegaly. SKELETON: No significant abnormal hypermetabolic activity in this region. 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 Cervical spine plate and screw fixator. IMPRESSION: 1. Large solid left mesenteric mass 13.0 cm with maximum SUV of 21.2, Deauville 5. 2. Hypermetabolic porta hepatis and retroperitoneal lymph nodes along with small but hypermetabolic juxtadiaphragmatic/pericardial lymph nodes in the lower chest, generally Deauville 4 and Deauville 5. 3. Branching nodularity in the left upper lobe, Deauville 2 activity, probably from atypical infectious bronchiolitis. 4. Subpleural plaque/nodularity along the left hemidiaphragm with Deauville 3 activity. 5. Accentuated activity at the pharyngoesophageal junction, most likely physiologic. 6. Other imaging findings of potential clinical significance: Aortic Atherosclerosis (ICD10-I70.0). Coronary atherosclerosis. 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 Bilateral nonobstructive nephrolithiasis. Small amount of pelvic ascites. Mild prostatomegaly. Electronically Signed   By: Van Clines M.D.   On: 04/28/2020 11:35   CT Biopsy  Result Date: 05/08/2020 CLINICAL DATA:  13 cm left-sided mesenteric abdominal mass. EXAM: CT GUIDED CORE BIOPSY OF MESENTERIC MASS ANESTHESIA/SEDATION: 1.5 mg IV Versed; 75 mcg IV Fentanyl Total Moderate Sedation Time:  18 minutes. The patient's level of consciousness and physiologic status were continuously monitored during the procedure by Radiology nursing. PROCEDURE: The procedure risks, benefits, and alternatives were explained to the patient. Questions regarding  the procedure were encouraged and answered. The patient understands and consents to the procedure. A time-out was performed prior to initiating the procedure. CT was performed through the abdomen in a supine position. The left abdominal wall was prepped with chlorhexidine in a sterile fashion, and a sterile drape was applied covering the operative field. A sterile gown and sterile gloves were used for the procedure. Local anesthesia was provided with 1% Lidocaine. Under CT guidance, a 17 gauge trocar needle was advanced to the level of a left-sided mesenteric mass. After confirming needle tip position, 4 separate coaxial 18 gauge core biopsy samples were obtained and submitted in saline.  Some Gel-Foam pledgets were advanced through the outer needle as the needle was retracted and removed. COMPLICATIONS: None FINDINGS: Large left-sided intra-abdominal mass within the mesentery measures up to 13.3 cm in maximum diameter. Solid tissue was obtained. IMPRESSION: CT-guided core biopsy performed of large left-sided mesenteric mass measuring just over 13 cm in estimated maximal diameter. Electronically Signed   By: Aletta Edouard M.D.   On: 05/08/2020 15:02   ECHOCARDIOGRAM COMPLETE  Result Date: 05/22/2020    ECHOCARDIOGRAM REPORT   Patient Name:   Connor Adams [facility] Date of Exam: 05/22/2020 Medical Rec #:  [id number]        Height:       68.0 in Accession #:    [id number]       Weight:       171.0 lb Date of Birth:  05/28/1946        BSA:          1.912 m Patient Age:    42 years         BP:           116/77 mmHg Patient Gender: M                HR:           80 bpm. Exam Location:  Outpatient Procedure: 2D Echo, Cardiac Doppler and Color Doppler Indications:    Chemotherapy evaluation v87.41 / v58.11  History:        Patient has no prior history of Echocardiogram examinations. TIA                 and Stroke; Risk Factors:Hypertension, Dyslipidemia and GERD.  Sonographer:    Bernadene Person RDCS Referring Phys: [id number] [place]  1. Left ventricular ejection fraction, by estimation, is 60 to 65%. The left ventricle has normal function. The left ventricle has no regional wall motion abnormalities. Left ventricular diastolic parameters are consistent with Grade I diastolic dysfunction (impaired relaxation). The average left ventricular global longitudinal strain is -17.6 %. The global longitudinal strain is normal.  2. Right ventricular systolic function is normal. The right ventricular size is normal.  3. The mitral valve is normal in structure. No evidence of mitral valve regurgitation. No evidence of mitral stenosis.  4. The aortic valve is normal in structure. Aortic valve regurgitation is not visualized. No aortic stenosis is present.  5. The inferior vena cava is normal in size with greater than 50% respiratory variability, suggesting right atrial pressure of 3 mmHg. FINDINGS  Left Ventricle: Left ventricular ejection fraction, by estimation, is 60 to 65%. The left ventricle has normal function. The left ventricle has no regional wall motion abnormalities. The average left ventricular global longitudinal strain is -17.6 %. The global longitudinal strain is normal. The left ventricular internal cavity size was normal in size. There is no left ventricular hypertrophy. Left ventricular diastolic parameters are consistent with Grade I diastolic dysfunction (impaired relaxation). Right Ventricle: The right ventricular size is normal. No increase in right ventricular wall thickness. Right ventricular systolic function is normal. Left Atrium: Left atrial size was normal in size. Right Atrium: Right atrial size was normal in size. Pericardium: There is no evidence of pericardial effusion. Mitral Valve: The mitral valve is normal in structure. Normal mobility of the mitral valve leaflets. No evidence of mitral valve regurgitation. No evidence of mitral valve stenosis. Tricuspid Valve: The tricuspid valve is normal in  structure. Tricuspid valve regurgitation is not demonstrated. No  evidence of tricuspid stenosis. Aortic Valve: The aortic valve is normal in structure. Aortic valve regurgitation is not visualized. No aortic stenosis is present. Pulmonic Valve: The pulmonic valve was normal in structure. Pulmonic valve regurgitation is trivial. No evidence of pulmonic stenosis. Aorta: The aortic root is normal in size and structure. Venous: The inferior vena cava is normal in size with greater than 50% respiratory variability, suggesting right atrial pressure of 3 mmHg. IAS/Shunts: No atrial level shunt detected by color flow Doppler.  LEFT VENTRICLE PLAX 2D LVIDd:         5.10 cm  Diastology LVIDs:         3.20 cm  LV e' lateral:   9.94 cm/s LV PW:         0.70 cm  LV E/e' lateral: 4.6 LV IVS:        0.70 cm  LV e' medial:    6.85 cm/s LVOT diam:     2.10 cm  LV E/e' medial:  6.6 LV SV:         81 LV SV Index:   43       2D Longitudinal Strain LVOT Area:     3.46 cm 2D Strain GLS Avg:     -17.6 %  RIGHT VENTRICLE RV S prime:     9.79 cm/s TAPSE (M-mode): 1.6 cm LEFT ATRIUM             Index       RIGHT ATRIUM           Index LA diam:        3.70 cm 1.94 cm/m  RA Area:     14.40 cm LA Vol (A2C):   69.5 ml 36.35 ml/m RA Volume:   31.90 ml  16.68 ml/m LA Vol (A4C):   51.0 ml 26.67 ml/m LA Biplane Vol: 59.4 ml 31.07 ml/m  AORTIC VALVE LVOT Vmax:   143.00 cm/s LVOT Vmean:  98.900 cm/s LVOT VTI:    0.235 m  AORTA Ao Root diam: 3.20 cm Ao Asc diam:  3.40 cm MITRAL VALVE MV Area (PHT): 2.22 cm    SHUNTS MV Decel Time: 342 msec    Systemic VTI:  0.24 m MV E velocity: 45.30 cm/s  Systemic Diam: 2.10 cm MV A velocity: 72.50 cm/s MV E/A ratio:  0.62 Candee Furbish MD Electronically signed by Candee Furbish MD Signature Date/Time: 05/22/2020/12:55:40 PM    Final    IR IMAGING GUIDED PORT INSERTION  Result Date: 05/19/2020 INDICATION: History of diffuse large B-cell lymphoma. In need of durable intravenous access for chemotherapy  administration EXAM: IMPLANTED PORT A CATH PLACEMENT WITH ULTRASOUND AND FLUOROSCOPIC GUIDANCE COMPARISON:  PET-CT-04/28/2020 MEDICATIONS: Ancef 2 gm IV; The antibiotic was administered within an appropriate time interval prior to skin puncture. ANESTHESIA/SEDATION: Moderate (conscious) sedation was employed during this procedure. A total of Versed 2 mg and Fentanyl 100 mcg was administered intravenously. Moderate Sedation Time: 25 minutes. The patient's level of consciousness and vital signs were monitored continuously by radiology nursing throughout the procedure under my direct supervision. CONTRAST:  None FLUOROSCOPY TIME:  24 seconds (5 mGy) COMPLICATIONS: None immediate. PROCEDURE: The procedure, risks, benefits, and alternatives were explained to the patient. Questions regarding the procedure were encouraged and answered. The patient understands and consents to the procedure. The right neck and chest were prepped with chlorhexidine in a sterile fashion, and a sterile drape was applied covering the operative field. Maximum barrier sterile technique with sterile gowns and gloves  were used for the procedure. A timeout was performed prior to the initiation of the procedure. Local anesthesia was provided with 1% lidocaine with epinephrine. After creating a small venotomy incision, a micropuncture kit was utilized to access the internal jugular vein. Real-time ultrasound guidance was utilized for vascular access including the acquisition of a permanent ultrasound image documenting patency of the accessed vessel. The microwire was utilized to measure appropriate catheter length. A subcutaneous port pocket was then created along the upper chest wall utilizing a combination of sharp and blunt dissection. The pocket was irrigated with sterile saline. A single lumen "Slim" sized power injectable port was chosen for placement. The 8 Fr catheter was tunneled from the port pocket site to the venotomy incision. The port was  placed in the pocket. The external catheter was trimmed to appropriate length. At the venotomy, an 8 Fr peel-away sheath was placed over a guidewire under fluoroscopic guidance. The catheter was then placed through the sheath and the sheath was removed. Final catheter positioning was confirmed and documented with a fluoroscopic spot radiograph. The port was accessed with a Huber needle, aspirated and flushed with heparinized saline. The venotomy site was closed with an interrupted 4-0 Vicryl suture. The port pocket incision was closed with interrupted 2-0 Vicryl suture. The skin was opposed with a running subcuticular 4-0 Vicryl suture. Dermabond and Steri-strips were applied to both incisions. Dressings were applied. The patient tolerated the procedure well without immediate post procedural complication. FINDINGS: After catheter placement, the tip lies within the superior cavoatrial junction. The catheter aspirates and flushes normally and is ready for immediate use. IMPRESSION: Successful placement of a right internal jugular approach power injectable Port-A-Cath. The catheter is ready for immediate use. Electronically Signed   By: Sandi Mariscal M.D.   On: 05/19/2020 15:59     NM PET Image Initial (PI) Skull Base To Thigh  Result Date: 04/28/2020 CLINICAL DATA:  Initial treatment strategy for mesenteric nodal mass. Reported history of melanoma. EXAM: NUCLEAR MEDICINE PET SKULL BASE TO THIGH TECHNIQUE: 9.1 mCi F-18 FDG was injected intravenously. Full-ring PET imaging was performed from the skull base to thigh after the radiotracer. CT data was obtained and used for attenuation correction and anatomic localization. Fasting blood glucose: 109 mg/dl COMPARISON:  CT abdomen 04/17/2020 FINDINGS: Mediastinal blood pool activity: SUV max 2.1 Liver activity: SUV max 2.6 NECK: Symmetric activity in the vicinity of the pharyngoesophageal junction, maximum SUV 13.0. Symmetric glottic activity. This regional activity is  likely physiologic. Incidental CT findings: none CHEST: Hypermetabolic lymph node in the right pericardial adipose tissue adjacent to the diaphragm, 0.6 cm in short axis on image 101/4, maximum SUV 7.4, Deauville 5. Activity along the inferior margin of the right first costosternal joint probably related to arthropathy, less likely a small internal mammary lymph node, maximum SUV 3.4, Deauville 4. Left basilar juxta diaphragmatic lymph node along the pleural adipose tissue with maximum SUV 5.1, Deauville 4. Subtle nodularity along the minor fissure about 0.3 cm in diameter without accentuated metabolic activity. Clustered/branching nodularity peripherally in the left upper lobe with individual nodules up to 0.5 cm in diameter on image 25/8, maximum SUV 1.1. The branching pattern tends to favor atypical infectious bronchiolitis. Subpleural plaque or nodularity along the left hemidiaphragm 0.5 cm, maximum SUV 2.3 (Deauville 3). Additional adjacent mild pleural-based nodularity as on the prior exam. Incidental CT findings: Coronary, aortic arch, and branch vessel atherosclerotic vascular disease. Calcified right paratracheal lymph nodes compatible with old granulomatous disease. ABDOMEN/PELVIS:  The 13.0 by 10.7 cm left mesenteric mass on image 128/4 has maximum SUV of 21.2, Deauville 5. Hypermetabolic porta hepatis and retroperitoneal adenopathy observed. A left periaortic lymph node measures 1.3 cm in short axis on image 142/4 (formerly 1.2 cm by my measurements) and has a maximum SUV of 18.9, Deauville 5. A right inguinal lymph node measuring 0.9 cm in short axis on image 192/4 has maximum SUV of 3.4, Deauville 4. There are some scattered additional small mesenteric lymph nodes with only low-grade activity. Mild mesenteric edema associated with the mesenteric mass. No splenomegaly or significant hypermetabolic splenic lesion is observed. Incidental CT findings: Aortoiliac atherosclerotic vascular disease. Bilateral  nonobstructive nephrolithiasis. Retroperitoneal and mesenteric stranding primarily related to the large mesenteric mass. Small amount of pelvic ascites. Mild prostatomegaly. SKELETON: No significant abnormal hypermetabolic activity in this region. 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 Cervical spine plate and screw fixator. IMPRESSION: 1. Large solid left mesenteric mass 13.0 cm with maximum SUV of 21.2, Deauville 5. 2. Hypermetabolic porta hepatis and retroperitoneal lymph nodes along with small but hypermetabolic juxtadiaphragmatic/pericardial lymph nodes in the lower chest, generally Deauville 4 and Deauville 5. 3. Branching nodularity in the left upper lobe, Deauville 2 activity, probably from atypical infectious bronchiolitis. 4. Subpleural plaque/nodularity along the left hemidiaphragm with Deauville 3 activity. 5. Accentuated activity at the pharyngoesophageal junction, most likely physiologic. 6. Other imaging findings of potential clinical significance: Aortic Atherosclerosis (ICD10-I70.0). Coronary atherosclerosis. 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 Bilateral nonobstructive nephrolithiasis. Small amount of pelvic ascites. Mild prostatomegaly. Electronically Signed   By: Van Clines M.D.   On: 04/28/2020 11:35   CT Biopsy  Result Date: 05/08/2020 CLINICAL DATA:  13 cm left-sided mesenteric abdominal mass. EXAM: CT GUIDED CORE BIOPSY OF MESENTERIC MASS ANESTHESIA/SEDATION: 1.5 mg IV Versed; 75 mcg IV Fentanyl Total Moderate Sedation Time:  18 minutes. The patient's level of consciousness and physiologic status were continuously monitored during the procedure by Radiology nursing. PROCEDURE: The procedure risks, benefits, and alternatives were explained to the patient. Questions regarding  the procedure were encouraged and answered. The patient understands and consents to the procedure. A time-out was performed prior to initiating the procedure. CT was performed through the abdomen in a supine position. The left abdominal wall was prepped with chlorhexidine in a sterile fashion, and a sterile drape was applied covering the operative field. A sterile gown and sterile gloves were used for the procedure. Local anesthesia was provided with 1% Lidocaine. Under CT guidance, a 17 gauge trocar needle was advanced to the level of a left-sided mesenteric mass. After confirming needle tip position, 4 separate coaxial 18 gauge core biopsy samples were obtained and submitted in saline. Some Gel-Foam pledgets were advanced through the outer needle as the needle was retracted and removed. COMPLICATIONS: None FINDINGS: Large left-sided intra-abdominal mass within the mesentery measures up to 13.3 cm in maximum diameter. Solid tissue was obtained. IMPRESSION: CT-guided core biopsy performed of large left-sided mesenteric mass measuring just over 13 cm in estimated maximal diameter. Electronically Signed   By: Aletta Edouard M.D.   On: 05/08/2020 15:02   ECHOCARDIOGRAM COMPLETE  Result Date: 05/22/2020    ECHOCARDIOGRAM REPORT   Patient Name:   Connor Adams [facility] Date of Exam: 05/22/2020 Medical Rec #:  [id number]        Height:  68.0 in Accession #:    [id number]       Weight:       171.0 lb Date of Birth:  1946-07-03        BSA:          1.912 m Patient Age:    33 years         BP:           116/77 mmHg Patient Gender: M                HR:           80 bpm. Exam Location:  Outpatient Procedure: 2D Echo, Cardiac Doppler and Color Doppler Indications:    Chemotherapy evaluation v87.41 / v58.11  History:        Patient has no prior history of Echocardiogram examinations. TIA                 and Stroke; Risk Factors:Hypertension, Dyslipidemia and GERD.  Sonographer:    Bernadene Person RDCS Referring Phys: [id number] [place]  1. Left ventricular ejection fraction, by estimation, is 60 to 65%. The left ventricle has normal function. The left ventricle has no regional wall motion abnormalities. Left ventricular diastolic parameters are consistent with Grade I diastolic dysfunction (impaired relaxation). The average left ventricular global longitudinal strain is -17.6 %. The global longitudinal strain is normal.  2. Right ventricular systolic function is normal. The right ventricular size is normal.  3. The mitral valve is normal in structure. No evidence of mitral valve regurgitation. No evidence of mitral stenosis.  4. The aortic valve is normal in structure. Aortic valve regurgitation is not visualized. No aortic stenosis is present.  5. The inferior vena cava is normal in size with greater than 50% respiratory variability, suggesting right atrial pressure of 3 mmHg. FINDINGS  Left Ventricle: Left ventricular ejection fraction, by estimation, is 60 to 65%. The left ventricle has normal function. The left ventricle has no regional wall motion abnormalities. The average left ventricular global longitudinal strain is -17.6 %. The global longitudinal strain is normal. The left ventricular internal cavity size was normal in size. There is no left ventricular hypertrophy. Left ventricular diastolic parameters are consistent with Grade I diastolic dysfunction (impaired relaxation). Right Ventricle: The right ventricular size is normal. No increase in right ventricular wall thickness. Right ventricular systolic function is normal. Left Atrium: Left atrial size was normal in size. Right Atrium: Right atrial size was normal in size. Pericardium: There is no evidence of pericardial effusion. Mitral Valve: The mitral valve is normal in structure. Normal mobility of the mitral valve leaflets. No evidence of mitral valve regurgitation. No evidence of mitral valve stenosis. Tricuspid Valve: The tricuspid valve is normal in  structure. Tricuspid valve regurgitation is not demonstrated. No evidence of tricuspid stenosis. Aortic Valve: The aortic valve is normal in structure. Aortic valve regurgitation is not visualized. No aortic stenosis is present. Pulmonic Valve: The pulmonic valve was normal in structure. Pulmonic valve regurgitation is trivial. No evidence of pulmonic stenosis. Aorta: The aortic root is normal in size and structure. Venous: The inferior vena cava is normal in size with greater than 50% respiratory variability, suggesting right atrial pressure of 3 mmHg. IAS/Shunts: No atrial level shunt detected by color flow Doppler.  LEFT VENTRICLE PLAX 2D LVIDd:         5.10 cm  Diastology LVIDs:         3.20 cm  LV  e' lateral:   9.94 cm/s LV PW:         0.70 cm  LV E/e' lateral: 4.6 LV IVS:        0.70 cm  LV e' medial:    6.85 cm/s LVOT diam:     2.10 cm  LV E/e' medial:  6.6 LV SV:         81 LV SV Index:   43       2D Longitudinal Strain LVOT Area:     3.46 cm 2D Strain GLS Avg:     -17.6 %  RIGHT VENTRICLE RV S prime:     9.79 cm/s TAPSE (M-mode): 1.6 cm LEFT ATRIUM             Index       RIGHT ATRIUM           Index LA diam:        3.70 cm 1.94 cm/m  RA Area:     14.40 cm LA Vol (A2C):   69.5 ml 36.35 ml/m RA Volume:   31.90 ml  16.68 ml/m LA Vol (A4C):   51.0 ml 26.67 ml/m LA Biplane Vol: 59.4 ml 31.07 ml/m  AORTIC VALVE LVOT Vmax:   143.00 cm/s LVOT Vmean:  98.900 cm/s LVOT VTI:    0.235 m  AORTA Ao Root diam: 3.20 cm Ao Asc diam:  3.40 cm MITRAL VALVE MV Area (PHT): 2.22 cm    SHUNTS MV Decel Time: 342 msec    Systemic VTI:  0.24 m MV E velocity: 45.30 cm/s  Systemic Diam: 2.10 cm MV A velocity: 72.50 cm/s MV E/A ratio:  0.62 Donato Schultz MD Electronically signed by Donato Schultz MD Signature Date/Time: 05/22/2020/12:55:40 PM    Final    IR IMAGING GUIDED PORT INSERTION  Result Date: 05/19/2020 INDICATION: History of diffuse large B-cell lymphoma. In need of durable intravenous access for chemotherapy  administration EXAM: IMPLANTED PORT A CATH PLACEMENT WITH ULTRASOUND AND FLUOROSCOPIC GUIDANCE COMPARISON:  PET-CT-04/28/2020 MEDICATIONS: Ancef 2 gm IV; The antibiotic was administered within an appropriate time interval prior to skin puncture. ANESTHESIA/SEDATION: Moderate (conscious) sedation was employed during this procedure. A total of Versed 2 mg and Fentanyl 100 mcg was administered intravenously. Moderate Sedation Time: 25 minutes. The patient's level of consciousness and vital signs were monitored continuously by radiology nursing throughout the procedure under my direct supervision. CONTRAST:  None FLUOROSCOPY TIME:  24 seconds (5 mGy) COMPLICATIONS: None immediate. PROCEDURE: The procedure, risks, benefits, and alternatives were explained to the patient. Questions regarding the procedure were encouraged and answered. The patient understands and consents to the procedure. The right neck and chest were prepped with chlorhexidine in a sterile fashion, and a sterile drape was applied covering the operative field. Maximum barrier sterile technique with sterile gowns and gloves were used for the procedure. A timeout was performed prior to the initiation of the procedure. Local anesthesia was provided with 1% lidocaine with epinephrine. After creating a small venotomy incision, a micropuncture kit was utilized to access the internal jugular vein. Real-time ultrasound guidance was utilized for vascular access including the acquisition of a permanent ultrasound image documenting patency of the accessed vessel. The microwire was utilized to measure appropriate catheter length. A subcutaneous port pocket was then created along the upper chest wall utilizing a combination of sharp and blunt dissection. The pocket was irrigated with sterile saline. A single lumen "Slim" sized power injectable port was chosen for placement. The 8 Fr catheter  was tunneled from the port pocket site to the venotomy incision. The port was  placed in the pocket. The external catheter was trimmed to appropriate length. At the venotomy, an 8 Fr peel-away sheath was placed over a guidewire under fluoroscopic guidance. The catheter was then placed through the sheath and the sheath was removed. Final catheter positioning was confirmed and documented with a fluoroscopic spot radiograph. The port was accessed with a Huber needle, aspirated and flushed with heparinized saline. The venotomy site was closed with an interrupted 4-0 Vicryl suture. The port pocket incision was closed with interrupted 2-0 Vicryl suture. The skin was opposed with a running subcuticular 4-0 Vicryl suture. Dermabond and Steri-strips were applied to both incisions. Dressings were applied. The patient tolerated the procedure well without immediate post procedural complication. FINDINGS: After catheter placement, the tip lies within the superior cavoatrial junction. The catheter aspirates and flushes normally and is ready for immediate use. IMPRESSION: Successful placement of a right internal jugular approach power injectable Port-A-Cath. The catheter is ready for immediate use. Electronically Signed   By: Sandi Mariscal M.D.   On: 05/19/2020 15:59    Assessment and Plan:  Connor Adams 74 y.o. male with medical history significant for Stage I DLBCL who presents for a follow up visit.  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 Recommend for him to proceed with EPOCH-R as an inpatient starting 05/27/2020.     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 Stage I bulky disease.   --Recommend EPOCH-R - first cycle  05/27/2020. --Adverse effects have been reviewed including, but not limited to, alopecia, myelosuppression, peripheral neuropathy, mucositis, liver/renal dysfunction, and possible infusion reaction associated with Rituxan. He agrees to proceed. --PAC in place and ready for use. --Echocardiogram from 05/22/2020 shows LVEF of 60-65%  --Lab adequate to proceed with treatment today.  --Will monitor closely with CBC with diff, CMET, LDH, and uric acid --Begin Allopurinol 300 mg daily.  --Lovenox for DVT prophylaxis. --Continue home medications for HTN and hyperlipidemia.  --Continue PRN antiemetics.  Will arrange Rituxan and Neulasta for Monday 05/02/2020 and labs and follow up with Dr. Lorenso Courier.   [name], DNP, AGPCNP-BC, AOCNP

## 2020-05-27 NOTE — Progress Notes (Signed)
Patient due for chemotherapy today.  Chemotherapy dosages, patient's BSA, total VTBI, 2 pharmacy signatures, expiration date, and patient identifiers independently verified by me and by Aldean Baker, RN. Zandra Abts [facility]  05/27/2020

## 2020-05-28 ENCOUNTER — Encounter: Payer: Self-pay | Admitting: Gastroenterology

## 2020-05-28 DIAGNOSIS — R918 Other nonspecific abnormal finding of lung field: Secondary | ICD-10-CM

## 2020-05-28 DIAGNOSIS — Z79899 Other long term (current) drug therapy: Secondary | ICD-10-CM

## 2020-05-28 DIAGNOSIS — I700 Atherosclerosis of aorta: Secondary | ICD-10-CM

## 2020-05-28 DIAGNOSIS — I129 Hypertensive chronic kidney disease with stage 1 through stage 4 chronic kidney disease, or unspecified chronic kidney disease: Secondary | ICD-10-CM

## 2020-05-28 DIAGNOSIS — K219 Gastro-esophageal reflux disease without esophagitis: Secondary | ICD-10-CM

## 2020-05-28 DIAGNOSIS — Z87442 Personal history of urinary calculi: Secondary | ICD-10-CM

## 2020-05-28 DIAGNOSIS — N400 Enlarged prostate without lower urinary tract symptoms: Secondary | ICD-10-CM

## 2020-05-28 DIAGNOSIS — N183 Chronic kidney disease, stage 3 unspecified: Secondary | ICD-10-CM

## 2020-05-28 DIAGNOSIS — M1990 Unspecified osteoarthritis, unspecified site: Secondary | ICD-10-CM

## 2020-05-28 DIAGNOSIS — Z8673 Personal history of transient ischemic attack (TIA), and cerebral infarction without residual deficits: Secondary | ICD-10-CM

## 2020-05-28 DIAGNOSIS — E785 Hyperlipidemia, unspecified: Secondary | ICD-10-CM

## 2020-05-28 DIAGNOSIS — C8333 Diffuse large B-cell lymphoma, intra-abdominal lymph nodes: Secondary | ICD-10-CM

## 2020-05-28 LAB — COMPREHENSIVE METABOLIC PANEL
ALT: 27 U/L (ref 0–44)
AST: 19 U/L (ref 15–41)
Albumin: 2.7 g/dL — ABNORMAL LOW (ref 3.5–5.0)
Alkaline Phosphatase: 51 U/L (ref 38–126)
Anion gap: 12 (ref 5–15)
BUN: 21 mg/dL (ref 8–23)
CO2: 24 mmol/L (ref 22–32)
Calcium: 8.4 mg/dL — ABNORMAL LOW (ref 8.9–10.3)
Chloride: 106 mmol/L (ref 98–111)
Creatinine, Ser: 1.07 mg/dL (ref 0.61–1.24)
GFR calc Af Amer: >60 mL/min (ref >60)
GFR calc non Af Amer: >60 mL/min (ref >60)
Glucose, Bld: 183 mg/dL — ABNORMAL HIGH (ref 70–99)
Potassium: 4.3 mmol/L (ref 3.5–5.1)
Sodium: 142 mmol/L (ref 135–145)
Total Bilirubin: 0.3 mg/dL (ref 0.3–1.2)
Total Protein: 6 g/dL — ABNORMAL LOW (ref 6.5–8.1)

## 2020-05-28 LAB — URIC ACID: Uric Acid, Serum: 7.4 mg/dL (ref 3.7–8.6)

## 2020-05-28 LAB — CBC WITH DIFFERENTIAL/PLATELET
Abs Immature Granulocytes: 0.02 10*3/uL (ref 0.00–0.07)
Basophils Absolute: 0 10*3/uL (ref 0.0–0.1)
Basophils Relative: 0 %
Eosinophils Absolute: 0 10*3/uL (ref 0.0–0.5)
Eosinophils Relative: 0 %
HCT: 31.3 % — ABNORMAL LOW (ref 39.0–52.0)
Hemoglobin: 10 g/dL — ABNORMAL LOW (ref 13.0–17.0)
Immature Granulocytes: 0 %
Lymphocytes Relative: 6 %
Lymphs Abs: 0.4 10*3/uL — ABNORMAL LOW (ref 0.7–4.0)
MCH: 29.1 pg (ref 26.0–34.0)
MCHC: 31.9 g/dL (ref 30.0–36.0)
MCV: 91 fL (ref 80.0–100.0)
Monocytes Absolute: 0 10*3/uL — ABNORMAL LOW (ref 0.1–1.0)
Monocytes Relative: 1 %
Neutro Abs: 6.2 10*3/uL (ref 1.7–7.7)
Neutrophils Relative %: 93 %
Platelets: 332 10*3/uL (ref 150–400)
RBC: 3.44 MIL/uL — ABNORMAL LOW (ref 4.22–5.81)
RDW: 13.5 % (ref 11.5–15.5)
WBC: 6.7 10*3/uL (ref 4.0–10.5)
nRBC: 0 % (ref 0.0–0.2)

## 2020-05-28 LAB — LACTATE DEHYDROGENASE: LDH: 314 U/L — ABNORMAL HIGH (ref 98–192)

## 2020-05-28 MED ORDER — SODIUM CHLORIDE 0.9 % IV SOLN
Freq: Once | INTRAVENOUS | Status: AC
  Administered 2020-05-28: 18 mg via INTRAVENOUS
  Filled 2020-05-28: qty 4

## 2020-05-28 MED ORDER — POLYETHYLENE GLYCOL 3350 17 G PO PACK: 17.0000 g | PACK | Freq: Every day | ORAL | Status: DC | PRN

## 2020-05-28 MED ORDER — SENNOSIDES-DOCUSATE SODIUM 8.6-50 MG PO TABS
2.0000 | ORAL_TABLET | Freq: Two times a day (BID) | ORAL | Status: DC
  Administered 2020-05-28 – 2020-05-31 (×7): 2 via ORAL
  Filled 2020-05-28 (×7): qty 2

## 2020-05-28 MED ORDER — VINCRISTINE SULFATE CHEMO INJECTION 1 MG/ML
Freq: Once | INTRAVENOUS | Status: AC
  Filled 2020-05-28: qty 10

## 2020-05-28 NOTE — Progress Notes (Signed)
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   1) 04/17/2020: CT A/P showed dominant nodal mass(11.5 x 10.9 cm)within the jejunal mesentery, highly suspicious for lymphoma. Additionally there are left lower lobe pleural-based pulmonary nodules 2) 04/24/2020: establish care with Dr. Lorenso Courier 3) 04/28/2020:  PET CT scan shows Large solid left mesenteric mass 13.0 cm with maximum SUV of 21.2, [place] 5 with local hypermetabolic porta hepatis and retroperitoneal lymph nodes. Constitutes bulky Stage I disease.   Reason for Admission: double hit lymphoma, Cycle #1 EPOCH-R  HPI: Connor Adams is a 74 year old male with medical history significant for double hit DLBCL. He presents for Cycle 1 of R-EPOCH chemotherapy. Risks and benefits have been discussed. He is ready and able to start treatment today  Interval History: -- tolerated Day 1 yesterday without complication  -- no BM since admission. Started senna docusate today with PRN miralax -- denies f/c/s  -- patient is in good spirits and OK to proceed.     Past Medical History:  Diagnosis Date  . Arthritis   . Chronic kidney disease (CKD), stage III (moderate)   . COVID-19   . GERD (gastroesophageal reflux disease)    occ  . Hemorrhoids   . History of ETT 3/05   low risk  . History of hiatal hernia   . History of kidney stones   . HLD (hyperlipidemia)   . HTN (hypertension)   . Left inguinal hernia   . Melanoma ([place])    excied  . 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 Stroke [facility]) 2011   tia   . TIA (transient ischemic attack) 4/11   associated w slurred speecha ndmild facial droop. lasted for about 10 minutes. Had MRI, etc with [facility] that per his report was ok (dr.  Stephannie Li). Echo (4/11): EF 55-60%, no regional WMAs, normal diastolic function, mild LAE, no source of embolus  :   Past Surgical History:  Procedure Laterality Date  . BMI 30.2 muscular  12/08/04  . curretage actinic keratosis R ear  01/20/05  . ETT low prob of CAD  11/21/03  . excision melanoma in situ back  01/20/05  . hemorrhoids sclerosed at colonoscopy  12/19/05  . IR IMAGING GUIDED PORT INSERTION  05/19/2020  . LAPAROSCOPIC INGUINAL HERNIA REPAIR Left 12/20/1995  . LUMBAR DISC SURGERY    . NERVE REPAIR     back of neck  . retinal laser surgery Left 09/20/1986  . SEPTOPLASTY  11/19/98  . TOTAL KNEE ARTHROPLASTY Left 12/13/2016   Procedure: TOTAL KNEE ARTHROPLASTY WITH RIGHT KNEE CORTISONE INJECTION;  Surgeon: Frederik Pear, MD;  Location: [place];  Service: Orthopedics;  Laterality: Left;  . uric acid 6.9  12/22/04  . x-ray l great toe  07/21/00  :   Current Facility-Administered Medications  Medication Dose Route Frequency Provider Last Rate Last Admin  . 0.9 %  sodium chloride infusion   Intravenous Continuous Orson Slick, MD 20 mL/hr at 05/28/20 1757 Rate Verify at 05/28/20 1757  . allopurinol (ZYLOPRIM) tablet 300 mg  300 mg Oral Daily [name] R, NP   300 mg at 05/28/20 1116  . aspirin tablet 325 mg  325 mg Oral Q4H PRN Maryanna Shape, NP      .  Chlorhexidine Gluconate Cloth 2 % PADS 6 each  6 each Topical Daily Maryanna Shape, NP   6 each at 05/28/20 1118  . Cold Pack 1 packet  1 packet Topical Once PRN [name] IV, MD      . DOXOrubicin (ADRIAMYCIN) 20 mg, etoposide (VEPESID) 96 mg, vinCRIStine (ONCOVIN) 0.8 mg in sodium chloride 0.9 % 1,000 mL chemo infusion   Intravenous Once Orson Slick, MD 51 mL/hr at 05/28/20 1422 New Bag at 05/28/20 1422  . enoxaparin (LOVENOX) injection 40 mg  40 mg Subcutaneous Q24H [name] R, NP   40 mg at 05/28/20 1116  . Hot Pack 1 packet  1 packet Topical Once PRN [name] IV, MD      . hydrocortisone (ANUSOL-HC)  2.5 % rectal cream 1 application  1 application Rectal Daily PRN Maryanna Shape, NP      . losartan (COZAAR) tablet 25 mg  25 mg Oral Daily [name] R, NP   25 mg at 05/28/20 1116  . multivitamin with minerals tablet 1 tablet  1 tablet Oral Daily Maryanna Shape, NP   1 tablet at 05/28/20 1116  . ondansetron (ZOFRAN-ODT) disintegrating tablet 8 mg  8 mg Oral Q8H PRN Curcio, Kristin R, NP      . pantoprazole (PROTONIX) EC tablet 40 mg  40 mg Oral BID [name] R, NP   40 mg at 05/28/20 1116  . polyethylene glycol (MIRALAX / GLYCOLAX) packet 17 g  17 g Oral Daily PRN Narda Rutherford T IV, MD      . predniSONE (DELTASONE) tablet 120 mg  120 mg Oral Q breakfast [name] IV, MD   120 mg at 05/28/20 0827  . prochlorperazine (COMPAZINE) tablet 10 mg  10 mg Oral Q6H PRN [name] R, NP      . rosuvastatin (CRESTOR) tablet 5 mg  5 mg Oral QPM Curcio, Kristin R, NP   5 mg at 05/28/20 1744  . senna-docusate (Senokot-S) tablet 2 tablet  2 tablet Oral BID Orson Slick, MD   2 tablet at 05/28/20 1404  . sodium bicarbonate/sodium chloride mouthwash 1054m   Mouth Rinse PRN Curcio, Kristin R, NP      . sodium chloride flush (NS) 0.9 % injection 10-40 mL  10-40 mL Intracatheter Q12H CMaryanna Shape NP   10 mL at 05/27/20 2035  . sodium chloride flush (NS) 0.9 % injection 10-40 mL  10-40 mL Intracatheter PRN Curcio, Kristin R, NP      . sucralfate (CARAFATE) 1 GM/10ML suspension 1 g  1 g Oral TID WC & HS Curcio, KRoselie Awkward NP   1 g at 05/28/20 1744     No Known Allergies:   Family History  Problem Relation Age of Onset  . Liver cancer Sister   . Heart failure Brother 44       CABGx4  . Heart attack Mother 727 . Uterine cancer Mother   . Heart attack Father 565 . Asthma Son   . Diabetes Brother   . Prostate cancer Brother   :   Social History   Socioeconomic History  . Marital status: Married    Spouse name: DJackelyn Poling . Number of children: 2  . Years of  education: 133 . Highest education level: Not on file  Occupational History  . Occupation: ISystems developer [employer]  Tobacco Use  . Smoking  status: Never Smoker  . Smokeless tobacco: Never Used  Vaping Use  . Vaping Use: Never used  Substance and Sexual Activity  . Alcohol use: No  . Drug use: No  . Sexual activity: Not on file  Other Topics Concern  . Not on file  Social History Narrative   Married to [employer] at [employer].   Son, [name] married   Son, [name], Married.    Caffeine use: 1 cup coffee per day   Social Determinants of Health   Financial Resource Strain:   . Difficulty of Paying Living Expenses: Not on file  Food Insecurity:   . Worried About Charity fundraiser in the Last Year: Not on file  . Ran Out of Food in the Last Year: Not on file  Transportation Needs:   . Lack of Transportation (Medical): Not on file  . Lack of Transportation (Non-Medical): Not on file  Physical Activity:   . Days of Exercise per Week: Not on file  . Minutes of Exercise per Session: Not on file  Stress:   . Feeling of Stress : Not on file  Social Connections:   . Frequency of Communication with Friends and Family: Not on file  . Frequency of Social Gatherings with Friends and Family: Not on file  . Attends Religious Services: Not on file  . Active Member of Clubs or Organizations: Not on file  . Attends Archivist Meetings: Not on file  . Marital Status: Not on file  Intimate Partner Violence:   . Fear of Current or Ex-Partner: Not on file  . Emotionally Abused: Not on file  . Physically Abused: Not on file  . Sexually Abused: Not on file  :  Review of Systems: A comprehensive 14 point review of systems was negative except as noted in the HPI.  Exam: Patient Vitals for the past 24 hrs:  BP Temp Temp src Pulse Resp SpO2  05/28/20 1503 113/75 (!) 97.4 F (36.3 C) Oral (!) 53 18 96 %  05/28/20 0514 112/62 98.4 F (36.9  C) Oral (!) 54 20 94 %  05/27/20 2338 (!) 97/45 98.4 F (36.9 C) Oral (!) 56 20 94 %  05/27/20 2029 125/81 98.3 F (36.8 C) Oral 69 20 98 %    General:  well-nourished in no acute distress.   Eyes:  no scleral icterus.   ENT:  There were no oropharyngeal lesions.   Neck was without thyromegaly.   Lymphatics:  Negative cervical, supraclavicular or axillary adenopathy.   Respiratory: lungs were clear bilaterally without wheezing or crackles.   Cardiovascular:  Regular rate and rhythm, S1/S2, without murmur, rub or gallop.  There was no pedal edema.   GI:  Positive bowel sounds. Palpable mass in the LUQ measures approximately 10 cm. Reports mild tenderness in this area.    Musculoskeletal:  no spinal tenderness of palpation of vertebral spine.   Skin exam was without echymosis, petichae.   Neuro exam was nonfocal. Patient was alert and oriented.  Attention was good.   Language was appropriate.  Mood was normal without depression.  Speech was not pressured.  Thought content was not tangential.     Lab Results  Component Value Date   WBC 6.7 05/28/2020   HGB 10.0 (L) 05/28/2020   HCT 31.3 (L) 05/28/2020   PLT 332 05/28/2020   GLUCOSE 183 (H) 05/28/2020   CHOL  12/31/2009    160  ATP III CLASSIFICATION:  <200     mg/dL   Desirable  200-239  mg/dL   Borderline High  >=240    mg/dL   High          TRIG 213 (H) 12/31/2009   HDL 28 (L) 12/31/2009   LDLCALC  12/31/2009    89        Total Cholesterol/HDL:CHD Risk Coronary Heart Disease Risk Table                     Men   Women  1/2 Average Risk   3.4   3.3  Average Risk       5.0   4.4  2 X Average Risk   9.6   7.1  3 X Average Risk  23.4   11.0        Use the calculated Patient Ratio above and the CHD Risk Table to determine the patient's CHD Risk.        ATP III CLASSIFICATION (LDL):  <100     mg/dL   Optimal  100-129  mg/dL   Near or Above                    Optimal  130-159  mg/dL   Borderline  160-189  mg/dL    High  >190     mg/dL   Very High   ALT 27 05/28/2020   AST 19 05/28/2020   NA 142 05/28/2020   K 4.3 05/28/2020   CL 106 05/28/2020   CREATININE 1.07 05/28/2020   BUN 21 05/28/2020   CO2 24 05/28/2020    CT Biopsy  Result Date: 05/08/2020 CLINICAL DATA:  13 cm left-sided mesenteric abdominal mass. EXAM: CT GUIDED CORE BIOPSY OF MESENTERIC MASS ANESTHESIA/SEDATION: 1.5 mg IV Versed; 75 mcg IV Fentanyl Total Moderate Sedation Time:  18 minutes. The patient's level of consciousness and physiologic status were continuously monitored during the procedure by Radiology nursing. PROCEDURE: The procedure risks, benefits, and alternatives were explained to the patient. Questions regarding the procedure were encouraged and answered. The patient understands and consents to the procedure. A time-out was performed prior to initiating the procedure. CT was performed through the abdomen in a supine position. The left abdominal wall was prepped with chlorhexidine in a sterile fashion, and a sterile drape was applied covering the operative field. A sterile gown and sterile gloves were used for the procedure. Local anesthesia was provided with 1% Lidocaine. Under CT guidance, a 17 gauge trocar needle was advanced to the level of a left-sided mesenteric mass. After confirming needle tip position, 4 separate coaxial 18 gauge core biopsy samples were obtained and submitted in saline. Some Gel-Foam pledgets were advanced through the outer needle as the needle was retracted and removed. COMPLICATIONS: None FINDINGS: Large left-sided intra-abdominal mass within the mesentery measures up to 13.3 cm in maximum diameter. Solid tissue was obtained. IMPRESSION: CT-guided core biopsy performed of large left-sided mesenteric mass measuring just over 13 cm in estimated maximal diameter. Electronically Signed   By: Aletta Edouard M.D.   On: 05/08/2020 15:02   ECHOCARDIOGRAM COMPLETE  Result Date: 05/22/2020    ECHOCARDIOGRAM REPORT    Patient Name:   Connor Adams [facility] Date of Exam: 05/22/2020 Medical Rec #:  [id number]        Height:       68.0 in Accession #:    [id number]       Weight:  171.0 lb Date of Birth:  05/28/1946        BSA:          1.912 m Patient Age:    17 years         BP:           116/77 mmHg Patient Gender: M                HR:           80 bpm. Exam Location:  Outpatient Procedure: 2D Echo, Cardiac Doppler and Color Doppler Indications:    Chemotherapy evaluation v87.41 / v58.11  History:        Patient has no prior history of Echocardiogram examinations. TIA                 and Stroke; Risk Factors:Hypertension, Dyslipidemia and GERD.  Sonographer:    Bernadene Person RDCS Referring Phys: [id number] [place]  1. Left ventricular ejection fraction, by estimation, is 60 to 65%. The left ventricle has normal function. The left ventricle has no regional wall motion abnormalities. Left ventricular diastolic parameters are consistent with Grade I diastolic dysfunction (impaired relaxation). The average left ventricular global longitudinal strain is -17.6 %. The global longitudinal strain is normal.  2. Right ventricular systolic function is normal. The right ventricular size is normal.  3. The mitral valve is normal in structure. No evidence of mitral valve regurgitation. No evidence of mitral stenosis.  4. The aortic valve is normal in structure. Aortic valve regurgitation is not visualized. No aortic stenosis is present.  5. The inferior vena cava is normal in size with greater than 50% respiratory variability, suggesting right atrial pressure of 3 mmHg. FINDINGS  Left Ventricle: Left ventricular ejection fraction, by estimation, is 60 to 65%. The left ventricle has normal function. The left ventricle has no regional wall motion abnormalities. The average left ventricular global longitudinal strain is -17.6 %. The global longitudinal strain is normal. The left ventricular internal cavity size was normal in size.  There is no left ventricular hypertrophy. Left ventricular diastolic parameters are consistent with Grade I diastolic dysfunction (impaired relaxation). Right Ventricle: The right ventricular size is normal. No increase in right ventricular wall thickness. Right ventricular systolic function is normal. Left Atrium: Left atrial size was normal in size. Right Atrium: Right atrial size was normal in size. Pericardium: There is no evidence of pericardial effusion. Mitral Valve: The mitral valve is normal in structure. Normal mobility of the mitral valve leaflets. No evidence of mitral valve regurgitation. No evidence of mitral valve stenosis. Tricuspid Valve: The tricuspid valve is normal in structure. Tricuspid valve regurgitation is not demonstrated. No evidence of tricuspid stenosis. Aortic Valve: The aortic valve is normal in structure. Aortic valve regurgitation is not visualized. No aortic stenosis is present. Pulmonic Valve: The pulmonic valve was normal in structure. Pulmonic valve regurgitation is trivial. No evidence of pulmonic stenosis. Aorta: The aortic root is normal in size and structure. Venous: The inferior vena cava is normal in size with greater than 50% respiratory variability, suggesting right atrial pressure of 3 mmHg. IAS/Shunts: No atrial level shunt detected by color flow Doppler.  LEFT VENTRICLE PLAX 2D LVIDd:         5.10 cm  Diastology LVIDs:         3.20 cm  LV e' lateral:   9.94 cm/s LV PW:         0.70 cm  LV E/e' lateral:  4.6 LV IVS:        0.70 cm  LV e' medial:    6.85 cm/s LVOT diam:     2.10 cm  LV E/e' medial:  6.6 LV SV:         81 LV SV Index:   43       2D Longitudinal Strain LVOT Area:     3.46 cm 2D Strain GLS Avg:     -17.6 %  RIGHT VENTRICLE RV S prime:     9.79 cm/s TAPSE (M-mode): 1.6 cm LEFT ATRIUM             Index       RIGHT ATRIUM           Index LA diam:        3.70 cm 1.94 cm/m  RA Area:     14.40 cm LA Vol (A2C):   69.5 ml 36.35 ml/m RA Volume:   31.90 ml  16.68  ml/m LA Vol (A4C):   51.0 ml 26.67 ml/m LA Biplane Vol: 59.4 ml 31.07 ml/m  AORTIC VALVE LVOT Vmax:   143.00 cm/s LVOT Vmean:  98.900 cm/s LVOT VTI:    0.235 m  AORTA Ao Root diam: 3.20 cm Ao Asc diam:  3.40 cm MITRAL VALVE MV Area (PHT): 2.22 cm    SHUNTS MV Decel Time: 342 msec    Systemic VTI:  0.24 m MV E velocity: 45.30 cm/s  Systemic Diam: 2.10 cm MV A velocity: 72.50 cm/s MV E/A ratio:  0.62 Candee Furbish MD Electronically signed by Candee Furbish MD Signature Date/Time: 05/22/2020/12:55:40 PM    Final    IR IMAGING GUIDED PORT INSERTION  Result Date: 05/19/2020 INDICATION: History of diffuse large B-cell lymphoma. In need of durable intravenous access for chemotherapy administration EXAM: IMPLANTED PORT A CATH PLACEMENT WITH ULTRASOUND AND FLUOROSCOPIC GUIDANCE COMPARISON:  PET-CT-04/28/2020 MEDICATIONS: Ancef 2 gm IV; The antibiotic was administered within an appropriate time interval prior to skin puncture. ANESTHESIA/SEDATION: Moderate (conscious) sedation was employed during this procedure. A total of Versed 2 mg and Fentanyl 100 mcg was administered intravenously. Moderate Sedation Time: 25 minutes. The patient's level of consciousness and vital signs were monitored continuously by radiology nursing throughout the procedure under my direct supervision. CONTRAST:  None FLUOROSCOPY TIME:  24 seconds (5 mGy) COMPLICATIONS: None immediate. PROCEDURE: The procedure, risks, benefits, and alternatives were explained to the patient. Questions regarding the procedure were encouraged and answered. The patient understands and consents to the procedure. The right neck and chest were prepped with chlorhexidine in a sterile fashion, and a sterile drape was applied covering the operative field. Maximum barrier sterile technique with sterile gowns and gloves were used for the procedure. A timeout was performed prior to the initiation of the procedure. Local anesthesia was provided with 1% lidocaine with epinephrine.  After creating a small venotomy incision, a micropuncture kit was utilized to access the internal jugular vein. Real-time ultrasound guidance was utilized for vascular access including the acquisition of a permanent ultrasound image documenting patency of the accessed vessel. The microwire was utilized to measure appropriate catheter length. A subcutaneous port pocket was then created along the upper chest wall utilizing a combination of sharp and blunt dissection. The pocket was irrigated with sterile saline. A single lumen "Slim" sized power injectable port was chosen for placement. The 8 Fr catheter was tunneled from the port pocket site to the venotomy incision. The port was placed in the pocket. The external catheter  was trimmed to appropriate length. At the venotomy, an 8 Fr peel-away sheath was placed over a guidewire under fluoroscopic guidance. The catheter was then placed through the sheath and the sheath was removed. Final catheter positioning was confirmed and documented with a fluoroscopic spot radiograph. The port was accessed with a Huber needle, aspirated and flushed with heparinized saline. The venotomy site was closed with an interrupted 4-0 Vicryl suture. The port pocket incision was closed with interrupted 2-0 Vicryl suture. The skin was opposed with a running subcuticular 4-0 Vicryl suture. Dermabond and Steri-strips were applied to both incisions. Dressings were applied. The patient tolerated the procedure well without immediate post procedural complication. FINDINGS: After catheter placement, the tip lies within the superior cavoatrial junction. The catheter aspirates and flushes normally and is ready for immediate use. IMPRESSION: Successful placement of a right internal jugular approach power injectable Port-A-Cath. The catheter is ready for immediate use. Electronically Signed   By: Sandi Mariscal M.D.   On: 05/19/2020 15:59     CT Biopsy  Result Date: 05/08/2020 CLINICAL DATA:  13 cm  left-sided mesenteric abdominal mass. EXAM: CT GUIDED CORE BIOPSY OF MESENTERIC MASS ANESTHESIA/SEDATION: 1.5 mg IV Versed; 75 mcg IV Fentanyl Total Moderate Sedation Time:  18 minutes. The patient's level of consciousness and physiologic status were continuously monitored during the procedure by Radiology nursing. PROCEDURE: The procedure risks, benefits, and alternatives were explained to the patient. Questions regarding the procedure were encouraged and answered. The patient understands and consents to the procedure. A time-out was performed prior to initiating the procedure. CT was performed through the abdomen in a supine position. The left abdominal wall was prepped with chlorhexidine in a sterile fashion, and a sterile drape was applied covering the operative field. A sterile gown and sterile gloves were used for the procedure. Local anesthesia was provided with 1% Lidocaine. Under CT guidance, a 17 gauge trocar needle was advanced to the level of a left-sided mesenteric mass. After confirming needle tip position, 4 separate coaxial 18 gauge core biopsy samples were obtained and submitted in saline. Some Gel-Foam pledgets were advanced through the outer needle as the needle was retracted and removed. COMPLICATIONS: None FINDINGS: Large left-sided intra-abdominal mass within the mesentery measures up to 13.3 cm in maximum diameter. Solid tissue was obtained. IMPRESSION: CT-guided core biopsy performed of large left-sided mesenteric mass measuring just over 13 cm in estimated maximal diameter. Electronically Signed   By: Aletta Edouard M.D.   On: 05/08/2020 15:02   ECHOCARDIOGRAM COMPLETE  Result Date: 05/22/2020    ECHOCARDIOGRAM REPORT   Patient Name:   Connor Adams [facility] Date of Exam: 05/22/2020 Medical Rec #:  [id number]        Height:       68.0 in Accession #:    [id number]       Weight:       171.0 lb Date of Birth:  [birth year]        BSA:          1.912 m Patient Age:    74 years         BP:            116/77 mmHg Patient Gender: M                HR:           80 bpm. Exam Location:  Outpatient Procedure: 2D Echo, Cardiac Doppler and Color Doppler Indications:    Chemotherapy evaluation v87.41 /  v58.11  History:        Patient has no prior history of Echocardiogram examinations. TIA                 and Stroke; Risk Factors:Hypertension, Dyslipidemia and GERD.  Sonographer:    Bernadene Person RDCS Referring Phys: [id number] [place]  1. Left ventricular ejection fraction, by estimation, is 60 to 65%. The left ventricle has normal function. The left ventricle has no regional wall motion abnormalities. Left ventricular diastolic parameters are consistent with Grade I diastolic dysfunction (impaired relaxation). The average left ventricular global longitudinal strain is -17.6 %. The global longitudinal strain is normal.  2. Right ventricular systolic function is normal. The right ventricular size is normal.  3. The mitral valve is normal in structure. No evidence of mitral valve regurgitation. No evidence of mitral stenosis.  4. The aortic valve is normal in structure. Aortic valve regurgitation is not visualized. No aortic stenosis is present.  5. The inferior vena cava is normal in size with greater than 50% respiratory variability, suggesting right atrial pressure of 3 mmHg. FINDINGS  Left Ventricle: Left ventricular ejection fraction, by estimation, is 60 to 65%. The left ventricle has normal function. The left ventricle has no regional wall motion abnormalities. The average left ventricular global longitudinal strain is -17.6 %. The global longitudinal strain is normal. The left ventricular internal cavity size was normal in size. There is no left ventricular hypertrophy. Left ventricular diastolic parameters are consistent with Grade I diastolic dysfunction (impaired relaxation). Right Ventricle: The right ventricular size is normal. No increase in right ventricular wall thickness. Right  ventricular systolic function is normal. Left Atrium: Left atrial size was normal in size. Right Atrium: Right atrial size was normal in size. Pericardium: There is no evidence of pericardial effusion. Mitral Valve: The mitral valve is normal in structure. Normal mobility of the mitral valve leaflets. No evidence of mitral valve regurgitation. No evidence of mitral valve stenosis. Tricuspid Valve: The tricuspid valve is normal in structure. Tricuspid valve regurgitation is not demonstrated. No evidence of tricuspid stenosis. Aortic Valve: The aortic valve is normal in structure. Aortic valve regurgitation is not visualized. No aortic stenosis is present. Pulmonic Valve: The pulmonic valve was normal in structure. Pulmonic valve regurgitation is trivial. No evidence of pulmonic stenosis. Aorta: The aortic root is normal in size and structure. Venous: The inferior vena cava is normal in size with greater than 50% respiratory variability, suggesting right atrial pressure of 3 mmHg. IAS/Shunts: No atrial level shunt detected by color flow Doppler.  LEFT VENTRICLE PLAX 2D LVIDd:         5.10 cm  Diastology LVIDs:         3.20 cm  LV e' lateral:   9.94 cm/s LV PW:         0.70 cm  LV E/e' lateral: 4.6 LV IVS:        0.70 cm  LV e' medial:    6.85 cm/s LVOT diam:     2.10 cm  LV E/e' medial:  6.6 LV SV:         81 LV SV Index:   43       2D Longitudinal Strain LVOT Area:     3.46 cm 2D Strain GLS Avg:     -17.6 %  RIGHT VENTRICLE RV S prime:     9.79 cm/s TAPSE (M-mode): 1.6 cm LEFT ATRIUM  Index       RIGHT ATRIUM           Index LA diam:        3.70 cm 1.94 cm/m  RA Area:     14.40 cm LA Vol (A2C):   69.5 ml 36.35 ml/m RA Volume:   31.90 ml  16.68 ml/m LA Vol (A4C):   51.0 ml 26.67 ml/m LA Biplane Vol: 59.4 ml 31.07 ml/m  AORTIC VALVE LVOT Vmax:   143.00 cm/s LVOT Vmean:  98.900 cm/s LVOT VTI:    0.235 m  AORTA Ao Root diam: 3.20 cm Ao Asc diam:  3.40 cm MITRAL VALVE MV Area (PHT): 2.22 cm    SHUNTS MV  Decel Time: 342 msec    Systemic VTI:  0.24 m MV E velocity: 45.30 cm/s  Systemic Diam: 2.10 cm MV A velocity: 72.50 cm/s MV E/A ratio:  0.62 Candee Furbish MD Electronically signed by Candee Furbish MD Signature Date/Time: 05/22/2020/12:55:40 PM    Final    IR IMAGING GUIDED PORT INSERTION  Result Date: 05/19/2020 INDICATION: History of diffuse large B-cell lymphoma. In need of durable intravenous access for chemotherapy administration EXAM: IMPLANTED PORT A CATH PLACEMENT WITH ULTRASOUND AND FLUOROSCOPIC GUIDANCE COMPARISON:  PET-CT-04/28/2020 MEDICATIONS: Ancef 2 gm IV; The antibiotic was administered within an appropriate time interval prior to skin puncture. ANESTHESIA/SEDATION: Moderate (conscious) sedation was employed during this procedure. A total of Versed 2 mg and Fentanyl 100 mcg was administered intravenously. Moderate Sedation Time: 25 minutes. The patient's level of consciousness and vital signs were monitored continuously by radiology nursing throughout the procedure under my direct supervision. CONTRAST:  None FLUOROSCOPY TIME:  24 seconds (5 mGy) COMPLICATIONS: None immediate. PROCEDURE: The procedure, risks, benefits, and alternatives were explained to the patient. Questions regarding the procedure were encouraged and answered. The patient understands and consents to the procedure. The right neck and chest were prepped with chlorhexidine in a sterile fashion, and a sterile drape was applied covering the operative field. Maximum barrier sterile technique with sterile gowns and gloves were used for the procedure. A timeout was performed prior to the initiation of the procedure. Local anesthesia was provided with 1% lidocaine with epinephrine. After creating a small venotomy incision, a micropuncture kit was utilized to access the internal jugular vein. Real-time ultrasound guidance was utilized for vascular access including the acquisition of a permanent ultrasound image documenting patency of the  accessed vessel. The microwire was utilized to measure appropriate catheter length. A subcutaneous port pocket was then created along the upper chest wall utilizing a combination of sharp and blunt dissection. The pocket was irrigated with sterile saline. A single lumen "Slim" sized power injectable port was chosen for placement. The 8 Fr catheter was tunneled from the port pocket site to the venotomy incision. The port was placed in the pocket. The external catheter was trimmed to appropriate length. At the venotomy, an 8 Fr peel-away sheath was placed over a guidewire under fluoroscopic guidance. The catheter was then placed through the sheath and the sheath was removed. Final catheter positioning was confirmed and documented with a fluoroscopic spot radiograph. The port was accessed with a Huber needle, aspirated and flushed with heparinized saline. The venotomy site was closed with an interrupted 4-0 Vicryl suture. The port pocket incision was closed with interrupted 2-0 Vicryl suture. The skin was opposed with a running subcuticular 4-0 Vicryl suture. Dermabond and Steri-strips were applied to both incisions. Dressings were applied. The patient tolerated the procedure  well without immediate post procedural complication. FINDINGS: After catheter placement, the tip lies within the superior cavoatrial junction. The catheter aspirates and flushes normally and is ready for immediate use. IMPRESSION: Successful placement of a right internal jugular approach power injectable Port-A-Cath. The catheter is ready for immediate use. Electronically Signed   By: Sandi Mariscal M.D.   On: 05/19/2020 15:59    Assessment and Plan:  Connor Adams 74 y.o. male with medical history significant for Stage I DLBCL who presents for a follow up visit.  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 Recommend for him to proceed with EPOCH-R as an inpatient starting 05/27/2020.  Today he is Cycle 1 Day 2 of treatment. He tolerated day 1 well with no nausea or vomiting. He reports no BM yet. His appetite is OK. There are no barriers to proceeding with treatment.      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 Stage I bulky disease.   --Continue EPOCH-R. Today is Cycle 1 Day 2 --Adverse effects have been reviewed including, but not limited to, alopecia, myelosuppression, peripheral neuropathy, mucositis, liver/renal dysfunction, and possible infusion reaction associated with Rituxan. He agrees to proceed. --PAC in place and ready for use. --Echocardiogram from 05/22/2020 shows LVEF of 60-65%  --Lab adequate to proceed with treatment today.  --Will monitor closely with CBC with diff, CMET, LDH, and uric acid --continue Allopurinol 300 mg daily.  --Lovenox for DVT prophylaxis. --Continue home medications for HTN and hyperlipidemia.  --Continue PRN antiemetics.  Will arrange Rituxan and Neulasta for Monday 05/02/2020, labs, and follow up.  [name], MD Department of Hematology/Oncology [place] [facility] Phone: [phone] Pager: [phone] Email: [email or web address]

## 2020-05-29 ENCOUNTER — Inpatient Hospital Stay: Payer: Medicare PPO

## 2020-05-29 ENCOUNTER — Inpatient Hospital Stay: Payer: Medicare PPO | Admitting: Hematology and Oncology

## 2020-05-29 ENCOUNTER — Other Ambulatory Visit: Payer: Self-pay | Admitting: Hematology and Oncology

## 2020-05-29 LAB — COMPREHENSIVE METABOLIC PANEL
ALT: 34 U/L (ref 0–44)
AST: 29 U/L (ref 15–41)
Albumin: 2.7 g/dL — ABNORMAL LOW (ref 3.5–5.0)
Alkaline Phosphatase: 48 U/L (ref 38–126)
Anion gap: 6 (ref 5–15)
BUN: 27 mg/dL — ABNORMAL HIGH (ref 8–23)
CO2: 24 mmol/L (ref 22–32)
Calcium: 7.9 mg/dL — ABNORMAL LOW (ref 8.9–10.3)
Chloride: 109 mmol/L (ref 98–111)
Creatinine, Ser: 1.09 mg/dL (ref 0.61–1.24)
GFR calc Af Amer: >60 mL/min (ref >60)
GFR calc non Af Amer: >60 mL/min (ref >60)
Glucose, Bld: 141 mg/dL — ABNORMAL HIGH (ref 70–99)
Potassium: 4 mmol/L (ref 3.5–5.1)
Sodium: 139 mmol/L (ref 135–145)
Total Bilirubin: 0.2 mg/dL — ABNORMAL LOW (ref 0.3–1.2)
Total Protein: 5.3 g/dL — ABNORMAL LOW (ref 6.5–8.1)

## 2020-05-29 LAB — LACTATE DEHYDROGENASE: LDH: 324 U/L — ABNORMAL HIGH (ref 98–192)

## 2020-05-29 LAB — CBC WITH DIFFERENTIAL/PLATELET
Abs Immature Granulocytes: 0.2 10*3/uL — ABNORMAL HIGH (ref 0.00–0.07)
Basophils Absolute: 0 10*3/uL (ref 0.0–0.1)
Basophils Relative: 0 %
Eosinophils Absolute: 0 10*3/uL (ref 0.0–0.5)
Eosinophils Relative: 0 %
HCT: 30.3 % — ABNORMAL LOW (ref 39.0–52.0)
Hemoglobin: 9.8 g/dL — ABNORMAL LOW (ref 13.0–17.0)
Immature Granulocytes: 1 %
Lymphocytes Relative: 3 %
Lymphs Abs: 0.6 10*3/uL — ABNORMAL LOW (ref 0.7–4.0)
MCH: 29.4 pg (ref 26.0–34.0)
MCHC: 32.3 g/dL (ref 30.0–36.0)
MCV: 91 fL (ref 80.0–100.0)
Monocytes Absolute: 0.5 10*3/uL (ref 0.1–1.0)
Monocytes Relative: 3 %
Neutro Abs: 16.7 10*3/uL — ABNORMAL HIGH (ref 1.7–7.7)
Neutrophils Relative %: 93 %
Platelets: 351 10*3/uL (ref 150–400)
RBC: 3.33 MIL/uL — ABNORMAL LOW (ref 4.22–5.81)
RDW: 13.6 % (ref 11.5–15.5)
WBC: 17.9 10*3/uL — ABNORMAL HIGH (ref 4.0–10.5)
nRBC: 0 % (ref 0.0–0.2)

## 2020-05-29 LAB — URIC ACID: Uric Acid, Serum: 6.7 mg/dL (ref 3.7–8.6)

## 2020-05-29 MED ORDER — SODIUM CHLORIDE 0.9 % IV SOLN
Freq: Once | INTRAVENOUS | Status: AC
  Administered 2020-05-29: 18 mg via INTRAVENOUS
  Filled 2020-05-29: qty 4

## 2020-05-29 MED ORDER — VINCRISTINE SULFATE CHEMO INJECTION 1 MG/ML
Freq: Once | INTRAVENOUS | Status: AC
  Filled 2020-05-29: qty 10

## 2020-05-29 NOTE — Progress Notes (Signed)
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   1) 04/17/2020: CT A/P showed dominant nodal mass(11.5 x 10.9 cm)within the jejunal mesentery, highly suspicious for lymphoma. Additionally there are left lower lobe pleural-based pulmonary nodules 2) 04/24/2020: establish care with Dr. Lorenso Courier 3) 04/28/2020:  PET CT scan shows Large solid left mesenteric mass 13.0 cm with maximum SUV of 21.2, [place] 5 with local hypermetabolic porta hepatis and retroperitoneal lymph nodes. Constitutes bulky Stage I disease.   Reason for Admission: double hit lymphoma, Cycle #1 EPOCH-R  HPI: Connor Adams is a 74 year old male with medical history significant for double hit DLBCL. He presents for Cycle 1 of R-EPOCH chemotherapy. Risks and benefits have been discussed. He is ready and able to start treatment today  Interval History: Has been tolerating chemotherapy well overall.  He denies nausea and vomiting.  Reports that his appetite has improved.  He reports less pressure in his left abdomen.  Bowels moved last evening without any difficulty.  States that he has a good energy level and has been ambulating in the hallway without any difficulty.   Diagnosis Date  . Arthritis   . Chronic kidney disease (CKD), stage III (moderate)   . COVID-19   . GERD (gastroesophageal reflux disease)    occ  . Hemorrhoids   . History of ETT 3/05   low risk  . History of hiatal hernia   . History of kidney stones   . HLD (hyperlipidemia)   . HTN (hypertension)   . Left inguinal hernia   . Melanoma ([place])    excied  . 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 Stroke [facility]) 2011   tia   . TIA (transient ischemic attack) 4/11   associated w slurred speecha ndmild facial  droop. lasted for about 10 minutes. Had MRI, etc with [facility] that per his report was ok (dr. Stephannie Adams). Echo (4/11): EF 55-60%, no regional WMAs, normal diastolic function, mild LAE, no source of embolus  Past Surgical History:  Procedure Laterality Date  . BMI 30.2 muscular  12/08/04  . curretage actinic keratosis R ear  01/20/05  . ETT low prob of CAD  11/21/03  . excision melanoma in situ back  01/20/05  . hemorrhoids sclerosed at colonoscopy  12/19/05  . IR IMAGING GUIDED PORT INSERTION  05/19/2020  . LAPAROSCOPIC INGUINAL HERNIA REPAIR Left 12/20/1995  . LUMBAR DISC SURGERY    . NERVE REPAIR     back of neck  . retinal laser surgery Left 09/20/1986  . SEPTOPLASTY  11/19/98  . TOTAL KNEE ARTHROPLASTY Left 12/13/2016   Procedure: TOTAL KNEE ARTHROPLASTY WITH RIGHT KNEE CORTISONE INJECTION;  Surgeon: Frederik Pear, MD;  Location: [facility];  Service: Orthopedics;  Laterality: Left;  . uric acid 6.9  12/22/04  . x-ray l great toe  07/21/00       Fre Past Surgical History:  Procedure Laterality Date  . BMI 30.2 muscular  12/08/04  . curretage actinic keratosis R ear  01/20/05  . ETT low prob of CAD  11/21/03  . excision melanoma in situ back  01/20/05  . hemorrhoids sclerosed at colonoscopy  12/19/05  . IR IMAGING GUIDED PORT INSERTION  05/19/2020  . LAPAROSCOPIC INGUINAL HERNIA REPAIR Left 12/20/1995  .  LUMBAR DISC SURGERY    . NERVE REPAIR     back of neck  . retinal laser surgery Left 09/20/1986  . SEPTOPLASTY  11/19/98  . TOTAL KNEE ARTHROPLASTY Left 12/13/2016   Procedure: TOTAL KNEE ARTHROPLASTY WITH RIGHT KNEE CORTISONE INJECTION;  Surgeon: Frederik Pear, MD;  Location: [place];  Service: Orthopedics;  Laterality: Left;  . uric acid 6.9  12/22/04  . x-ray l great toe  07/21/00  quency Provider Last Rate Last Admin  . 0.9 %  sodium chloride infusion   Intravenous Continuous [name] IV, MD 20 mL/hr at 05/29/20 0400 Rate Verify at 05/29/20 0400  . allopurinol (ZYLOPRIM) tablet 300 mg  300 mg  Oral Daily [name] R, NP   300 mg at 05/29/20 1038  . aspirin tablet 325 mg  325 mg Oral Q4H PRN Maryanna Shape, NP      . Chlorhexidine Gluconate Cloth 2 % PADS 6 each  6 each Topical Daily Maryanna Shape, NP   6 each at 05/29/20 1038  . Cold Pack 1 packet  1 packet Topical Once PRN [name] IV, MD      . DOXOrubicin (ADRIAMYCIN) 20 mg, etoposide (VEPESID) 96 mg, vinCRIStine (ONCOVIN) 0.8 mg in sodium chloride 0.9 % 1,000 mL chemo infusion   Intravenous Once Orson Slick, MD 51 mL/hr at 05/28/20 1422 New Bag at 05/28/20 1422  . DOXOrubicin (ADRIAMYCIN) 20 mg, etoposide (VEPESID) 96 mg, vinCRIStine (ONCOVIN) 0.8 mg in sodium chloride 0.9 % 1,000 mL chemo infusion   Intravenous Once Narda Rutherford T IV, MD      . enoxaparin (LOVENOX) injection 40 mg  40 mg Subcutaneous Q24H [name] R, NP   40 mg at 05/28/20 1116  . Hot Pack 1 packet  1 packet Topical Once PRN [name] IV, MD      . hydrocortisone (ANUSOL-HC) 2.5 % rectal cream 1 application  1 application Rectal Daily PRN Maryanna Shape, NP      . losartan (COZAAR) tablet 25 mg  25 mg Oral Daily [name] R, NP   25 mg at 05/29/20 1038  . multivitamin with minerals tablet 1 tablet  1 tablet Oral Daily Maryanna Shape, NP   1 tablet at 05/29/20 1038  . ondansetron (ZOFRAN-ODT) disintegrating tablet 8 mg  8 mg Oral Q8H PRN Alysen Smylie R, NP      . pantoprazole (PROTONIX) EC tablet 40 mg  40 mg Oral BID [name] R, NP   40 mg at 05/29/20 1038  . polyethylene glycol (MIRALAX / GLYCOLAX) packet 17 g  17 g Oral Daily PRN Narda Rutherford T IV, MD      . predniSONE (DELTASONE) tablet 120 mg  120 mg Oral Q breakfast [name] IV, MD   120 mg at 05/29/20 0819  . prochlorperazine (COMPAZINE) tablet 10 mg  10 mg Oral Q6H PRN [name] R, NP      . rosuvastatin (CRESTOR) tablet 5 mg  5 mg Oral QPM Suren Payne R, NP   5 mg at 05/28/20 1744  . senna-docusate (Senokot-S) tablet 2 tablet  2 tablet  Oral BID Orson Slick, MD   2 tablet at 05/29/20 1038  . sodium bicarbonate/sodium chloride mouthwash 109ml   Mouth Rinse PRN Sephira Zellman R, NP      . sodium chloride flush (NS) 0.9 % injection 10-40 mL  10-40 mL Intracatheter Q12H Velton Roselle, Roselie Awkward, NP  10 mL at 05/28/20 2129  . sodium chloride flush (NS) 0.9 % injection 10-40 mL  10-40 mL Intracatheter PRN Greysen Devino R, NP      . sucralfate (CARAFATE) 1 GM/10ML suspension 1 g  1 g Oral TID WC & HS Israa Caban, Roselie Awkward, NP   1 g at 05/29/20 4401       Family History  Problem Relation Age of Onset  . Liver cancer Sister   . Heart failure Brother 44       CABGx4  . Heart attack Mother 62  . Uterine cancer Mother   . Heart attack Father 23  . Asthma Son   . Diabetes Brother   . Prostate cancer Brother   Relation Age of Onset  . Liver cancer Sister   . Heart failure Brother 44       CABGx4  . Heart attack Mother 78  . Uterine cancer Mother   . Heart attack Father 10  . Asthma Son   . Diabetes Brother   . Prostate cancer Brother      Socioeconomic History  . Marital status: Married    Spouse name: Connor Adams  . Number of children: 2  . Years of education: 37  . Highest education level: Not on file  Occupational History  . Occupation: Systems developer: [employer]  Tobacco Use  . Smoking status: Never Smoker  . Smokeless tobacco: Never Used  Vaping Use  . Vaping Use: Never used  Substance and Sexual Activity  . Alcohol use: No  . Drug use: No  . Sexual activity: Not on file  Other Topics Concern  . Not on file  Social History Narrative   Married to [employer] at [employer].   Son, Connor Adams married   Son, Connor Adams, Married.    Caffeine use: 1 cup coffee per day   Social Determinants of Health   Financial Resource Strain:   . Difficulty of Paying Living Expenses: Not on file  Food Insecurity:   . Worried About Charity fundraiser in the Last Year: Not on file  . Ran Out of  Food in the Last Year: Not on file  Transportation Needs:   . Lack of Transportation (Medical): Not on file  . Lack of Transportation (Non-Medical): Not on file  Physical Activity:   . Days of Exercise per Week: Not on file  . Minutes of Exercise per Session: Not on file  Stress:   . Feeling of Stress : Not on file  Social Connections:   . Frequency of Communication with Friends and Family: Not on file  . Frequency of Social Gatherings with Friends and Family: Not on file  . Attends Religious Services: Not on file  . Active Member of Clubs or Organizations: Not on file  . Attends Archivist Meetings: Not on file  . Marital Status: Not on file  Intimate Partner Violence:   . Fear of Current or Ex-Partner: Not on file  . Emotionally Abused: Not on file  . Physically Abused: Not on file  . Sexually Abused: Not on file  :  Review of Systems: A comprehensive 14 point review of systems was negative except as noted in the HPI.  Exam: Patient Vitals for the past 24 hrs:  BP Temp Temp src Pulse Resp SpO2  05/29/20 0600 107/60 98 F (36.7 C) Oral (!) 56 20 97 %  05/28/20 2222 125/68 (!) 97.4 F (36.3  C) Oral 60 20 98 %  05/28/20 1503 113/75 (!) 97.4 F (36.3 C) Oral (!) 53 18 96 %    General:  well-nourished in no acute distress.   Eyes:  no scleral icterus.   ENT:  There were no oropharyngeal lesions.   Neck was without thyromegaly.   Lymphatics:  Negative cervical, supraclavicular or axillary adenopathy.   Respiratory: lungs were clear bilaterally without wheezing or crackles.   Cardiovascular:  Regular rate and rhythm, S1/S2, without murmur, rub or gallop.  There was no pedal edema.   GI:  Positive bowel sounds.  Unable to fully palpate left upper quadrant mass.  No tenderness with palpation reported today. Musculoskeletal:  no spinal tenderness of palpation of vertebral spine.   Skin exam was without echymosis, petichae.   Neuro exam was nonfocal. Patient was alert  and oriented.  Attention was good.   Language was appropriate.  Mood was normal without depression.  Speech was not pressured.  Thought content was not tangential.     Lab Results  Component Value Date   WBC 17.9 (H) 05/29/2020   HGB 9.8 (L) 05/29/2020   HCT 30.3 (L) 05/29/2020   PLT 351 05/29/2020   GLUCOSE 141 (H) 05/29/2020   CHOL  12/31/2009    160        ATP III CLASSIFICATION:  <200     mg/dL   Desirable  200-239  mg/dL   Borderline High  >=240    mg/dL   High          TRIG 213 (H) 12/31/2009   HDL 28 (L) 12/31/2009   LDLCALC  12/31/2009    89        Total Cholesterol/HDL:CHD Risk Coronary Heart Disease Risk Table                     Men   Women  1/2 Average Risk   3.4   3.3  Average Risk       5.0   4.4  2 X Average Risk   9.6   7.1  3 X Average Risk  23.4   11.0        Use the calculated Patient Ratio above and the CHD Risk Table to determine the patient's CHD Risk.        ATP III CLASSIFICATION (LDL):  <100     mg/dL   Optimal  100-129  mg/dL   Near or Above                    Optimal  130-159  mg/dL   Borderline  160-189  mg/dL   High  >190     mg/dL   Very High   ALT 34 05/29/2020   AST 29 05/29/2020   NA 139 05/29/2020   K 4.0 05/29/2020   CL 109 05/29/2020   CREATININE 1.09 05/29/2020   BUN 27 (H) 05/29/2020   CO2 24 05/29/2020    CT Biopsy  Result Date: 05/08/2020 CLINICAL DATA:  13 cm left-sided mesenteric abdominal mass. EXAM: CT GUIDED CORE BIOPSY OF MESENTERIC MASS ANESTHESIA/SEDATION: 1.5 mg IV Versed; 75 mcg IV Fentanyl Total Moderate Sedation Time:  18 minutes. The patient's level of consciousness and physiologic status were continuously monitored during the procedure by Radiology nursing. PROCEDURE: The procedure risks, benefits, and alternatives were explained to the patient. Questions regarding the procedure were encouraged and answered. The patient understands and consents to the procedure. A time-out was performed  prior to initiating the  procedure. CT was performed through the abdomen in a supine position. The left abdominal wall was prepped with chlorhexidine in a sterile fashion, and a sterile drape was applied covering the operative field. A sterile gown and sterile gloves were used for the procedure. Local anesthesia was provided with 1% Lidocaine. Under CT guidance, a 17 gauge trocar needle was advanced to the level of a left-sided mesenteric mass. After confirming needle tip position, 4 separate coaxial 18 gauge core biopsy samples were obtained and submitted in saline. Some Gel-Foam pledgets were advanced through the outer needle as the needle was retracted and removed. COMPLICATIONS: None FINDINGS: Large left-sided intra-abdominal mass within the mesentery measures up to 13.3 cm in maximum diameter. Solid tissue was obtained. IMPRESSION: CT-guided core biopsy performed of large left-sided mesenteric mass measuring just over 13 cm in estimated maximal diameter. Electronically Signed   By: Aletta Edouard M.D.   On: 05/08/2020 15:02   ECHOCARDIOGRAM COMPLETE  Result Date: 05/22/2020    ECHOCARDIOGRAM REPORT   Patient Name:   Connor Adams [facility] Date of Exam: 05/22/2020 Medical Rec #:  [id number]        Height:       68.0 in Accession #:    [id number]       Weight:       171.0 lb Date of Birth:  [birth year]        BSA:          1.912 m Patient Age:    8 years         BP:           116/77 mmHg Patient Gender: M                HR:           80 bpm. Exam Location:  Outpatient Procedure: 2D Echo, Cardiac Doppler and Color Doppler Indications:    Chemotherapy evaluation v87.41 / v58.11  History:        Patient has no prior history of Echocardiogram examinations. TIA                 and Stroke; Risk Factors:Hypertension, Dyslipidemia and GERD.  Sonographer:    Bernadene Person RDCS Referring Phys: [id number] [place]  1. Left ventricular ejection fraction, by estimation, is 60 to 65%. The left ventricle has normal function. The left  ventricle has no regional wall motion abnormalities. Left ventricular diastolic parameters are consistent with Grade I diastolic dysfunction (impaired relaxation). The average left ventricular global longitudinal strain is -17.6 %. The global longitudinal strain is normal.  2. Right ventricular systolic function is normal. The right ventricular size is normal.  3. The mitral valve is normal in structure. No evidence of mitral valve regurgitation. No evidence of mitral stenosis.  4. The aortic valve is normal in structure. Aortic valve regurgitation is not visualized. No aortic stenosis is present.  5. The inferior vena cava is normal in size with greater than 50% respiratory variability, suggesting right atrial pressure of 3 mmHg. FINDINGS  Left Ventricle: Left ventricular ejection fraction, by estimation, is 60 to 65%. The left ventricle has normal function. The left ventricle has no regional wall motion abnormalities. The average left ventricular global longitudinal strain is -17.6 %. The global longitudinal strain is normal. The left ventricular internal cavity size was normal in size. There is no left ventricular hypertrophy. Left ventricular diastolic parameters are consistent with Grade I diastolic dysfunction (impaired  relaxation). Right Ventricle: The right ventricular size is normal. No increase in right ventricular wall thickness. Right ventricular systolic function is normal. Left Atrium: Left atrial size was normal in size. Right Atrium: Right atrial size was normal in size. Pericardium: There is no evidence of pericardial effusion. Mitral Valve: The mitral valve is normal in structure. Normal mobility of the mitral valve leaflets. No evidence of mitral valve regurgitation. No evidence of mitral valve stenosis. Tricuspid Valve: The tricuspid valve is normal in structure. Tricuspid valve regurgitation is not demonstrated. No evidence of tricuspid stenosis. Aortic Valve: The aortic valve is normal in  structure. Aortic valve regurgitation is not visualized. No aortic stenosis is present. Pulmonic Valve: The pulmonic valve was normal in structure. Pulmonic valve regurgitation is trivial. No evidence of pulmonic stenosis. Aorta: The aortic root is normal in size and structure. Venous: The inferior vena cava is normal in size with greater than 50% respiratory variability, suggesting right atrial pressure of 3 mmHg. IAS/Shunts: No atrial level shunt detected by color flow Doppler.  LEFT VENTRICLE PLAX 2D LVIDd:         5.10 cm  Diastology LVIDs:         3.20 cm  LV e' lateral:   9.94 cm/s LV PW:         0.70 cm  LV E/e' lateral: 4.6 LV IVS:        0.70 cm  LV e' medial:    6.85 cm/s LVOT diam:     2.10 cm  LV E/e' medial:  6.6 LV SV:         81 LV SV Index:   43       2D Longitudinal Strain LVOT Area:     3.46 cm 2D Strain GLS Avg:     -17.6 %  RIGHT VENTRICLE RV S prime:     9.79 cm/s TAPSE (M-mode): 1.6 cm LEFT ATRIUM             Index       RIGHT ATRIUM           Index LA diam:        3.70 cm 1.94 cm/m  RA Area:     14.40 cm LA Vol (A2C):   69.5 ml 36.35 ml/m RA Volume:   31.90 ml  16.68 ml/m LA Vol (A4C):   51.0 ml 26.67 ml/m LA Biplane Vol: 59.4 ml 31.07 ml/m  AORTIC VALVE LVOT Vmax:   143.00 cm/s LVOT Vmean:  98.900 cm/s LVOT VTI:    0.235 m  AORTA Ao Root diam: 3.20 cm Ao Asc diam:  3.40 cm MITRAL VALVE MV Area (PHT): 2.22 cm    SHUNTS MV Decel Time: 342 msec    Systemic VTI:  0.24 m MV E velocity: 45.30 cm/s  Systemic Diam: 2.10 cm MV A velocity: 72.50 cm/s MV E/A ratio:  0.62 Candee Furbish MD Electronically signed by Candee Furbish MD Signature Date/Time: 05/22/2020/12:55:40 PM    Final    IR IMAGING GUIDED PORT INSERTION  Result Date: 05/19/2020 INDICATION: History of diffuse large B-cell lymphoma. In need of durable intravenous access for chemotherapy administration EXAM: IMPLANTED PORT A CATH PLACEMENT WITH ULTRASOUND AND FLUOROSCOPIC GUIDANCE COMPARISON:  PET-CT-04/28/2020 MEDICATIONS: Ancef 2 gm  IV; The antibiotic was administered within an appropriate time interval prior to skin puncture. ANESTHESIA/SEDATION: Moderate (conscious) sedation was employed during this procedure. A total of Versed 2 mg and Fentanyl 100 mcg was administered intravenously. Moderate Sedation Time: 25 minutes. The  patient's level of consciousness and vital signs were monitored continuously by radiology nursing throughout the procedure under my direct supervision. CONTRAST:  None FLUOROSCOPY TIME:  24 seconds (5 mGy) COMPLICATIONS: None immediate. PROCEDURE: The procedure, risks, benefits, and alternatives were explained to the patient. Questions regarding the procedure were encouraged and answered. The patient understands and consents to the procedure. The right neck and chest were prepped with chlorhexidine in a sterile fashion, and a sterile drape was applied covering the operative field. Maximum barrier sterile technique with sterile gowns and gloves were used for the procedure. A timeout was performed prior to the initiation of the procedure. Local anesthesia was provided with 1% lidocaine with epinephrine. After creating a small venotomy incision, a micropuncture kit was utilized to access the internal jugular vein. Real-time ultrasound guidance was utilized for vascular access including the acquisition of a permanent ultrasound image documenting patency of the accessed vessel. The microwire was utilized to measure appropriate catheter length. A subcutaneous port pocket was then created along the upper chest wall utilizing a combination of sharp and blunt dissection. The pocket was irrigated with sterile saline. A single lumen "Slim" sized power injectable port was chosen for placement. The 8 Fr catheter was tunneled from the port pocket site to the venotomy incision. The port was placed in the pocket. The external catheter was trimmed to appropriate length. At the venotomy, an 8 Fr peel-away sheath was placed over a guidewire  under fluoroscopic guidance. The catheter was then placed through the sheath and the sheath was removed. Final catheter positioning was confirmed and documented with a fluoroscopic spot radiograph. The port was accessed with a Huber needle, aspirated and flushed with heparinized saline. The venotomy site was closed with an interrupted 4-0 Vicryl suture. The port pocket incision was closed with interrupted 2-0 Vicryl suture. The skin was opposed with a running subcuticular 4-0 Vicryl suture. Dermabond and Steri-strips were applied to both incisions. Dressings were applied. The patient tolerated the procedure well without immediate post procedural complication. FINDINGS: After catheter placement, the tip lies within the superior cavoatrial junction. The catheter aspirates and flushes normally and is ready for immediate use. IMPRESSION: Successful placement of a right internal jugular approach power injectable Port-A-Cath. The catheter is ready for immediate use. Electronically Signed   By: Sandi Mariscal M.D.   On: 05/19/2020 15:59     CT Biopsy  Result Date: 05/08/2020 CLINICAL DATA:  13 cm left-sided mesenteric abdominal mass. EXAM: CT GUIDED CORE BIOPSY OF MESENTERIC MASS ANESTHESIA/SEDATION: 1.5 mg IV Versed; 75 mcg IV Fentanyl Total Moderate Sedation Time:  18 minutes. The patient's level of consciousness and physiologic status were continuously monitored during the procedure by Radiology nursing. PROCEDURE: The procedure risks, benefits, and alternatives were explained to the patient. Questions regarding the procedure were encouraged and answered. The patient understands and consents to the procedure. A time-out was performed prior to initiating the procedure. CT was performed through the abdomen in a supine position. The left abdominal wall was prepped with chlorhexidine in a sterile fashion, and a sterile drape was applied covering the operative field. A sterile gown and sterile gloves were used for the  procedure. Local anesthesia was provided with 1% Lidocaine. Under CT guidance, a 17 gauge trocar needle was advanced to the level of a left-sided mesenteric mass. After confirming needle tip position, 4 separate coaxial 18 gauge core biopsy samples were obtained and submitted in saline. Some Gel-Foam pledgets were advanced through the outer needle as the needle  was retracted and removed. COMPLICATIONS: None FINDINGS: Large left-sided intra-abdominal mass within the mesentery measures up to 13.3 cm in maximum diameter. Solid tissue was obtained. IMPRESSION: CT-guided core biopsy performed of large left-sided mesenteric mass measuring just over 13 cm in estimated maximal diameter. Electronically Signed   By: Aletta Edouard M.D.   On: 05/08/2020 15:02   ECHOCARDIOGRAM COMPLETE  Result Date: 05/22/2020    ECHOCARDIOGRAM REPORT   Patient Name:   Connor Adams [facility] Date of Exam: 05/22/2020 Medical Rec #:  [id number]        Height:       68.0 in Accession #:    [id number]       Weight:       171.0 lb Date of Birth:  [birth year]        BSA:          1.912 m Patient Age:    84 years         BP:           116/77 mmHg Patient Gender: M                HR:           80 bpm. Exam Location:  Outpatient Procedure: 2D Echo, Cardiac Doppler and Color Doppler Indications:    Chemotherapy evaluation v87.41 / v58.11  History:        Patient has no prior history of Echocardiogram examinations. TIA                 and Stroke; Risk Factors:Hypertension, Dyslipidemia and GERD.  Sonographer:    Bernadene Person RDCS Referring Phys: [id number] [place]  1. Left ventricular ejection fraction, by estimation, is 60 to 65%. The left ventricle has normal function. The left ventricle has no regional wall motion abnormalities. Left ventricular diastolic parameters are consistent with Grade I diastolic dysfunction (impaired relaxation). The average left ventricular global longitudinal strain is -17.6 %. The global longitudinal strain is  normal.  2. Right ventricular systolic function is normal. The right ventricular size is normal.  3. The mitral valve is normal in structure. No evidence of mitral valve regurgitation. No evidence of mitral stenosis.  4. The aortic valve is normal in structure. Aortic valve regurgitation is not visualized. No aortic stenosis is present.  5. The inferior vena cava is normal in size with greater than 50% respiratory variability, suggesting right atrial pressure of 3 mmHg. FINDINGS  Left Ventricle: Left ventricular ejection fraction, by estimation, is 60 to 65%. The left ventricle has normal function. The left ventricle has no regional wall motion abnormalities. The average left ventricular global longitudinal strain is -17.6 %. The global longitudinal strain is normal. The left ventricular internal cavity size was normal in size. There is no left ventricular hypertrophy. Left ventricular diastolic parameters are consistent with Grade I diastolic dysfunction (impaired relaxation). Right Ventricle: The right ventricular size is normal. No increase in right ventricular wall thickness. Right ventricular systolic function is normal. Left Atrium: Left atrial size was normal in size. Right Atrium: Right atrial size was normal in size. Pericardium: There is no evidence of pericardial effusion. Mitral Valve: The mitral valve is normal in structure. Normal mobility of the mitral valve leaflets. No evidence of mitral valve regurgitation. No evidence of mitral valve stenosis. Tricuspid Valve: The tricuspid valve is normal in structure. Tricuspid valve regurgitation is not demonstrated. No evidence of tricuspid stenosis. Aortic Valve: The aortic valve is normal in structure.  Aortic valve regurgitation is not visualized. No aortic stenosis is present. Pulmonic Valve: The pulmonic valve was normal in structure. Pulmonic valve regurgitation is trivial. No evidence of pulmonic stenosis. Aorta: The aortic root is normal in size and  structure. Venous: The inferior vena cava is normal in size with greater than 50% respiratory variability, suggesting right atrial pressure of 3 mmHg. IAS/Shunts: No atrial level shunt detected by color flow Doppler.  LEFT VENTRICLE PLAX 2D LVIDd:         5.10 cm  Diastology LVIDs:         3.20 cm  LV e' lateral:   9.94 cm/s LV PW:         0.70 cm  LV E/e' lateral: 4.6 LV IVS:        0.70 cm  LV e' medial:    6.85 cm/s LVOT diam:     2.10 cm  LV E/e' medial:  6.6 LV SV:         81 LV SV Index:   43       2D Longitudinal Strain LVOT Area:     3.46 cm 2D Strain GLS Avg:     -17.6 %  RIGHT VENTRICLE RV S prime:     9.79 cm/s TAPSE (M-mode): 1.6 cm LEFT ATRIUM             Index       RIGHT ATRIUM           Index LA diam:        3.70 cm 1.94 cm/m  RA Area:     14.40 cm LA Vol (A2C):   69.5 ml 36.35 ml/m RA Volume:   31.90 ml  16.68 ml/m LA Vol (A4C):   51.0 ml 26.67 ml/m LA Biplane Vol: 59.4 ml 31.07 ml/m  AORTIC VALVE LVOT Vmax:   143.00 cm/s LVOT Vmean:  98.900 cm/s LVOT VTI:    0.235 m  AORTA Ao Root diam: 3.20 cm Ao Asc diam:  3.40 cm MITRAL VALVE MV Area (PHT): 2.22 cm    SHUNTS MV Decel Time: 342 msec    Systemic VTI:  0.24 m MV E velocity: 45.30 cm/s  Systemic Diam: 2.10 cm MV A velocity: 72.50 cm/s MV E/A ratio:  0.62 Candee Furbish MD Electronically signed by Candee Furbish MD Signature Date/Time: 05/22/2020/12:55:40 PM    Final    IR IMAGING GUIDED PORT INSERTION  Result Date: 05/19/2020 INDICATION: History of diffuse large B-cell lymphoma. In need of durable intravenous access for chemotherapy administration EXAM: IMPLANTED PORT A CATH PLACEMENT WITH ULTRASOUND AND FLUOROSCOPIC GUIDANCE COMPARISON:  PET-CT-04/28/2020 MEDICATIONS: Ancef 2 gm IV; The antibiotic was administered within an appropriate time interval prior to skin puncture. ANESTHESIA/SEDATION: Moderate (conscious) sedation was employed during this procedure. A total of Versed 2 mg and Fentanyl 100 mcg was administered intravenously. Moderate  Sedation Time: 25 minutes. The patient's level of consciousness and vital signs were monitored continuously by radiology nursing throughout the procedure under my direct supervision. CONTRAST:  None FLUOROSCOPY TIME:  24 seconds (5 mGy) COMPLICATIONS: None immediate. PROCEDURE: The procedure, risks, benefits, and alternatives were explained to the patient. Questions regarding the procedure were encouraged and answered. The patient understands and consents to the procedure. The right neck and chest were prepped with chlorhexidine in a sterile fashion, and a sterile drape was applied covering the operative field. Maximum barrier sterile technique with sterile gowns and gloves were used for the procedure. A timeout was performed prior to the  initiation of the procedure. Local anesthesia was provided with 1% lidocaine with epinephrine. After creating a small venotomy incision, a micropuncture kit was utilized to access the internal jugular vein. Real-time ultrasound guidance was utilized for vascular access including the acquisition of a permanent ultrasound image documenting patency of the accessed vessel. The microwire was utilized to measure appropriate catheter length. A subcutaneous port pocket was then created along the upper chest wall utilizing a combination of sharp and blunt dissection. The pocket was irrigated with sterile saline. A single lumen "Slim" sized power injectable port was chosen for placement. The 8 Fr catheter was tunneled from the port pocket site to the venotomy incision. The port was placed in the pocket. The external catheter was trimmed to appropriate length. At the venotomy, an 8 Fr peel-away sheath was placed over a guidewire under fluoroscopic guidance. The catheter was then placed through the sheath and the sheath was removed. Final catheter positioning was confirmed and documented with a fluoroscopic spot radiograph. The port was accessed with a Huber needle, aspirated and flushed with  heparinized saline. The venotomy site was closed with an interrupted 4-0 Vicryl suture. The port pocket incision was closed with interrupted 2-0 Vicryl suture. The skin was opposed with a running subcuticular 4-0 Vicryl suture. Dermabond and Steri-strips were applied to both incisions. Dressings were applied. The patient tolerated the procedure well without immediate post procedural complication. FINDINGS: After catheter placement, the tip lies within the superior cavoatrial junction. The catheter aspirates and flushes normally and is ready for immediate use. IMPRESSION: Successful placement of a right internal jugular approach power injectable Port-A-Cath. The catheter is ready for immediate use. Electronically Signed   By: Sandi Mariscal M.D.   On: 05/19/2020 15:59    Assessment and Plan:  Connor Adams 74 y.o. male with medical history significant for Stage I DLBCL who presents for a follow up visit.  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 Recommend for him to proceed with EPOCH-R as an inpatient starting 05/27/2020.  Today he is Cycle 1 Day 3 of treatment.  He is tolerating his chemotherapy well so far with no nausea or vomiting.  Bowels are moving and his appetite has improved.  There are no barriers to proceeding with treatment.      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 Stage I bulky disease.   --Continue EPOCH-R. Today is Cycle 1 Day 3 --Adverse effects have been reviewed including, but not limited to, alopecia, myelosuppression, peripheral neuropathy, mucositis, liver/renal dysfunction, and possible infusion reaction  associated with Rituxan. He agrees to proceed. --PAC in place and ready for use. --Echocardiogram from 05/22/2020 shows LVEF of 60-65%  --Labs adequate to proceed with treatment today.  Leukocytosis likely related to steroids. --Will monitor closely with CBC with diff, CMET, LDH, and uric acid --continue Allopurinol 300 mg daily.  --Lovenox for DVT prophylaxis. --Continue home medications for HTN and hyperlipidemia.  --Continue PRN antiemetics. --Continue Senokot.  I have sent a scheduling message to the [facility] schedulers to arrange for a lab, flush, visit with Dr. Lorenso Courier, Rituxan, and Neulasta for 05/02/2020.  [name], DNP, AGPCNP-BC, AOCNP  Mon/Tues/Thurs/Fri 7am-5pm; Off Wednesdays Cell: [phone]

## 2020-05-30 ENCOUNTER — Other Ambulatory Visit: Payer: Self-pay

## 2020-05-30 LAB — CBC WITH DIFFERENTIAL/PLATELET
Abs Immature Granulocytes: 0.13 10*3/uL — ABNORMAL HIGH (ref 0.00–0.07)
Basophils Absolute: 0 10*3/uL (ref 0.0–0.1)
Basophils Relative: 0 %
Eosinophils Absolute: 0 10*3/uL (ref 0.0–0.5)
Eosinophils Relative: 0 %
HCT: 28.9 % — ABNORMAL LOW (ref 39.0–52.0)
Hemoglobin: 9.1 g/dL — ABNORMAL LOW (ref 13.0–17.0)
Immature Granulocytes: 1 %
Lymphocytes Relative: 3 %
Lymphs Abs: 0.4 10*3/uL — ABNORMAL LOW (ref 0.7–4.0)
MCH: 28.8 pg (ref 26.0–34.0)
MCHC: 31.5 g/dL (ref 30.0–36.0)
MCV: 91.5 fL (ref 80.0–100.0)
Monocytes Absolute: 0.3 10*3/uL (ref 0.1–1.0)
Monocytes Relative: 2 %
Neutro Abs: 9.9 10*3/uL — ABNORMAL HIGH (ref 1.7–7.7)
Neutrophils Relative %: 94 %
Platelets: 304 10*3/uL (ref 150–400)
RBC: 3.16 MIL/uL — ABNORMAL LOW (ref 4.22–5.81)
RDW: 13.4 % (ref 11.5–15.5)
WBC: 10.6 10*3/uL — ABNORMAL HIGH (ref 4.0–10.5)
nRBC: 0 % (ref 0.0–0.2)

## 2020-05-30 LAB — COMPREHENSIVE METABOLIC PANEL
ALT: 33 U/L (ref 0–44)
AST: 25 U/L (ref 15–41)
Albumin: 2.8 g/dL — ABNORMAL LOW (ref 3.5–5.0)
Alkaline Phosphatase: 42 U/L (ref 38–126)
Anion gap: 9 (ref 5–15)
BUN: 26 mg/dL — ABNORMAL HIGH (ref 8–23)
CO2: 24 mmol/L (ref 22–32)
Calcium: 8.3 mg/dL — ABNORMAL LOW (ref 8.9–10.3)
Chloride: 109 mmol/L (ref 98–111)
Creatinine, Ser: 0.91 mg/dL (ref 0.61–1.24)
GFR calc Af Amer: >60 mL/min (ref >60)
GFR calc non Af Amer: >60 mL/min (ref >60)
Glucose, Bld: 117 mg/dL — ABNORMAL HIGH (ref 70–99)
Potassium: 3.9 mmol/L (ref 3.5–5.1)
Sodium: 142 mmol/L (ref 135–145)
Total Bilirubin: 0.4 mg/dL (ref 0.3–1.2)
Total Protein: 5.6 g/dL — ABNORMAL LOW (ref 6.5–8.1)

## 2020-05-30 LAB — LACTATE DEHYDROGENASE: LDH: 290 U/L — ABNORMAL HIGH (ref 98–192)

## 2020-05-30 LAB — URIC ACID: Uric Acid, Serum: 6.2 mg/dL (ref 3.7–8.6)

## 2020-05-30 MED ORDER — SENNOSIDES-DOCUSATE SODIUM 8.6-50 MG PO TABS: 2.0000 | ORAL_TABLET | Freq: Two times a day (BID) | ORAL | Status: DC | PRN

## 2020-05-30 MED ORDER — SODIUM CHLORIDE 0.9 % IV SOLN
750.0000 mg/m2 | Freq: Once | INTRAVENOUS | Status: AC
  Administered 2020-05-31: 1440 mg via INTRAVENOUS
  Filled 2020-05-30: qty 72

## 2020-05-30 MED ORDER — ALLOPURINOL 300 MG PO TABS: 300.0000 mg | ORAL_TABLET | Freq: Every day | ORAL | 1 refills | Status: DC

## 2020-05-30 MED ORDER — POLYETHYLENE GLYCOL 3350 17 G PO PACK: 17.0000 g | PACK | Freq: Every day | ORAL | 0 refills | Status: DC | PRN

## 2020-05-30 MED ORDER — HYDROCORTISONE 2.5 % RE CREA: 1.0000 "application " | TOPICAL_CREAM | Freq: Every day | RECTAL | 0 refills | Status: DC | PRN

## 2020-05-30 MED ORDER — SODIUM CHLORIDE 0.9 % IV SOLN
Freq: Once | INTRAVENOUS | Status: AC
  Administered 2020-05-31: 16 mg via INTRAVENOUS
  Filled 2020-05-30: qty 8

## 2020-05-30 MED ORDER — SODIUM BICARBONATE/SODIUM CHLORIDE MOUTHWASH: 1.0000 "application " | OROMUCOSAL | Status: DC | PRN

## 2020-05-30 MED ORDER — SODIUM CHLORIDE 0.9 % IV SOLN
Freq: Once | INTRAVENOUS | Status: AC
  Administered 2020-05-30: 18 mg via INTRAVENOUS
  Filled 2020-05-30: qty 4

## 2020-05-30 MED ORDER — VINCRISTINE SULFATE CHEMO INJECTION 1 MG/ML
Freq: Once | INTRAVENOUS | Status: AC
  Filled 2020-05-30 (×2): qty 10

## 2020-05-30 NOTE — Progress Notes (Signed)
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   1) 04/17/2020: CT A/P showed dominant nodal mass(11.5 x 10.9 cm)within the jejunal mesentery, highly suspicious for lymphoma. Additionally there are left lower lobe pleural-based pulmonary nodules 2) 04/24/2020: establish care with Dr. Lorenso Courier 3) 04/28/2020:  PET CT scan shows Large solid left mesenteric mass 13.0 cm with maximum SUV of 21.2, [place] 5 with local hypermetabolic porta hepatis and retroperitoneal lymph nodes. Constitutes bulky Stage I disease.   Reason for Admission: double hit lymphoma, Cycle #1 EPOCH-R  HPI: Connor Adams is a 74 year old male with medical history significant for double hit DLBCL. He presents for Cycle 1 of R-EPOCH chemotherapy. Risks and benefits have been discussed. He is ready and able to start treatment today  Interval History: Has been tolerating chemotherapy well overall.  He denies nausea and vomiting.  Reports that his appetite has improved.  He reports less pressure in his left abdomen.  Bowels are moving.  States that he has a good energy level and has been ambulating in the hallway without any difficulty.   Diagnosis Date  . Arthritis   . Chronic kidney disease (CKD), stage III (moderate)   . COVID-19   . GERD (gastroesophageal reflux disease)    occ  . Hemorrhoids   . History of ETT 3/05   low risk  . History of hiatal hernia   . History of kidney stones   . HLD (hyperlipidemia)   . HTN (hypertension)   . Left inguinal hernia   . Melanoma ([place])    excied  . 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 Stroke [facility]) 2011   tia   . TIA (transient ischemic attack) 4/11   associated w slurred speecha ndmild facial droop. lasted for about 10 minutes.  Had MRI, etc with [facility] that per his report was ok (dr. Stephannie Li). Echo (4/11): EF 55-60%, no regional WMAs, normal diastolic function, mild LAE, no source of embolus  Past Surgical History:  Procedure Laterality Date  . BMI 30.2 muscular  12/08/04  . curretage actinic keratosis R ear  01/20/05  . ETT low prob of CAD  11/21/03  . excision melanoma in situ back  01/20/05  . hemorrhoids sclerosed at colonoscopy  12/19/05  . IR IMAGING GUIDED PORT INSERTION  05/19/2020  . LAPAROSCOPIC INGUINAL HERNIA REPAIR Left 12/20/1995  . LUMBAR DISC SURGERY    . NERVE REPAIR     back of neck  . retinal laser surgery Left 09/20/1986  . SEPTOPLASTY  11/19/98  . TOTAL KNEE ARTHROPLASTY Left 12/13/2016   Procedure: TOTAL KNEE ARTHROPLASTY WITH RIGHT KNEE CORTISONE INJECTION;  Surgeon: Frederik Pear, MD;  Location: [place];  Service: Orthopedics;  Laterality: Left;  . uric acid 6.9  12/22/04  . x-ray l great toe  07/21/00       Fre Past Surgical History:  Procedure Laterality Date  . BMI 30.2 muscular  12/08/04  . curretage actinic keratosis R ear  01/20/05  . ETT low prob of CAD  11/21/03  . excision melanoma in situ back  01/20/05  . hemorrhoids sclerosed at colonoscopy  12/19/05  . IR IMAGING GUIDED PORT INSERTION  05/19/2020  . LAPAROSCOPIC INGUINAL HERNIA REPAIR Left 12/20/1995  . LUMBAR DISC SURGERY    .  NERVE REPAIR     back of neck  . retinal laser surgery Left 09/20/1986  . SEPTOPLASTY  11/19/98  . TOTAL KNEE ARTHROPLASTY Left 12/13/2016   Procedure: TOTAL KNEE ARTHROPLASTY WITH RIGHT KNEE CORTISONE INJECTION;  Surgeon: Frederik Pear, MD;  Location: [place];  Service: Orthopedics;  Laterality: Left;  . uric acid 6.9  12/22/04  . x-ray l great toe  07/21/00  quency Provider Last Rate Last Admin  . 0.9 %  sodium chloride infusion   Intravenous Continuous [name] IV, MD 20 mL/hr at 05/29/20 0400 Rate Verify at 05/29/20 0400  . allopurinol (ZYLOPRIM) tablet 300 mg  300 mg Oral Daily [name] R, NP    300 mg at 05/29/20 1038  . aspirin tablet 325 mg  325 mg Oral Q4H PRN Maryanna Shape, NP      . Chlorhexidine Gluconate Cloth 2 % PADS 6 each  6 each Topical Daily Maryanna Shape, NP   6 each at 05/29/20 1038  . Cold Pack 1 packet  1 packet Topical Once PRN [name] IV, MD      . DOXOrubicin (ADRIAMYCIN) 20 mg, etoposide (VEPESID) 96 mg, vinCRIStine (ONCOVIN) 0.8 mg in sodium chloride 0.9 % 1,000 mL chemo infusion   Intravenous Once Orson Slick, MD 51 mL/hr at 05/28/20 1422 New Bag at 05/28/20 1422  . DOXOrubicin (ADRIAMYCIN) 20 mg, etoposide (VEPESID) 96 mg, vinCRIStine (ONCOVIN) 0.8 mg in sodium chloride 0.9 % 1,000 mL chemo infusion   Intravenous Once Narda Rutherford T IV, MD      . enoxaparin (LOVENOX) injection 40 mg  40 mg Subcutaneous Q24H [name] R, NP   40 mg at 05/28/20 1116  . Hot Pack 1 packet  1 packet Topical Once PRN [name] IV, MD      . hydrocortisone (ANUSOL-HC) 2.5 % rectal cream 1 application  1 application Rectal Daily PRN Maryanna Shape, NP      . losartan (COZAAR) tablet 25 mg  25 mg Oral Daily [name] R, NP   25 mg at 05/29/20 1038  . multivitamin with minerals tablet 1 tablet  1 tablet Oral Daily Maryanna Shape, NP   1 tablet at 05/29/20 1038  . ondansetron (ZOFRAN-ODT) disintegrating tablet 8 mg  8 mg Oral Q8H PRN Bellany Elbaum R, NP      . pantoprazole (PROTONIX) EC tablet 40 mg  40 mg Oral BID [name] R, NP   40 mg at 05/29/20 1038  . polyethylene glycol (MIRALAX / GLYCOLAX) packet 17 g  17 g Oral Daily PRN Narda Rutherford T IV, MD      . predniSONE (DELTASONE) tablet 120 mg  120 mg Oral Q breakfast [name] IV, MD   120 mg at 05/29/20 0819  . prochlorperazine (COMPAZINE) tablet 10 mg  10 mg Oral Q6H PRN [name] R, NP      . rosuvastatin (CRESTOR) tablet 5 mg  5 mg Oral QPM Wallice Granville R, NP   5 mg at 05/28/20 1744  . senna-docusate (Senokot-S) tablet 2 tablet  2 tablet Oral BID Orson Slick, MD   2  tablet at 05/29/20 1038  . sodium bicarbonate/sodium chloride mouthwash 1066ml   Mouth Rinse PRN Kyree Fedorko R, NP      . sodium chloride flush (NS) 0.9 % injection 10-40 mL  10-40 mL Intracatheter Q12H Maryanna Shape, NP   10 mL at 05/28/20 2129  .  sodium chloride flush (NS) 0.9 % injection 10-40 mL  10-40 mL Intracatheter PRN Laurelyn Terrero R, NP      . sucralfate (CARAFATE) 1 GM/10ML suspension 1 g  1 g Oral TID WC & HS Vernida Mcnicholas, Roselie Awkward, NP   1 g at 05/29/20 9357       Family History  Problem Relation Age of Onset  . Liver cancer Sister   . Heart failure Brother 44       CABGx4  . Heart attack Mother 26  . Uterine cancer Mother   . Heart attack Father 80  . Asthma Son   . Diabetes Brother   . Prostate cancer Brother   Relation Age of Onset  . Liver cancer Sister   . Heart failure Brother 44       CABGx4  . Heart attack Mother 34  . Uterine cancer Mother   . Heart attack Father 60  . Asthma Son   . Diabetes Brother   . Prostate cancer Brother      Socioeconomic History  . Marital status: Married    Spouse name: [name]  . Number of children: 2  . Years of education: 7  . Highest education level: Not on file  Occupational History  . Occupation: Systems developer: [employer]  Tobacco Use  . Smoking status: Never Smoker  . Smokeless tobacco: Never Used  Vaping Use  . Vaping Use: Never used  Substance and Sexual Activity  . Alcohol use: No  . Drug use: No  . Sexual activity: Not on file  Other Topics Concern  . Not on file  Social History Narrative   Married to [employer] at [employer].   Son, [name] married   Son, [name], Married.    Caffeine use: 1 cup coffee per day   Social Determinants of Health   Financial Resource Strain:   . Difficulty of Paying Living Expenses: Not on file  Food Insecurity:   . Worried About Charity fundraiser in the Last Year: Not on file  . Ran Out of Food in the Last Year: Not on file   Transportation Needs:   . Lack of Transportation (Medical): Not on file  . Lack of Transportation (Non-Medical): Not on file  Physical Activity:   . Days of Exercise per Week: Not on file  . Minutes of Exercise per Session: Not on file  Stress:   . Feeling of Stress : Not on file  Social Connections:   . Frequency of Communication with Friends and Family: Not on file  . Frequency of Social Gatherings with Friends and Family: Not on file  . Attends Religious Services: Not on file  . Active Member of Clubs or Organizations: Not on file  . Attends Archivist Meetings: Not on file  . Marital Status: Not on file  Intimate Partner Violence:   . Fear of Current or Ex-Partner: Not on file  . Emotionally Abused: Not on file  . Physically Abused: Not on file  . Sexually Abused: Not on file  :  Review of Systems: A comprehensive 14 point review of systems was negative except as noted in the HPI.  Exam: Patient Vitals for the past 24 hrs:  BP Temp Temp src Pulse Resp SpO2  05/29/20 0600 107/60 98 F (36.7 C) Oral (!) 56 20 97 %  05/28/20 2222 125/68 (!) 97.4 F (36.3 C) Oral 60 20 98 %  05/28/20 1503 113/75 (!) 97.4 F (36.3 C) Oral (!) 53 18 96 %    General:  well-nourished in no acute distress.   Eyes:  no scleral icterus.   ENT:  There were no oropharyngeal lesions.   Neck was without thyromegaly.   Lymphatics:  Negative cervical, supraclavicular or axillary adenopathy.   Respiratory: lungs were clear bilaterally without wheezing or crackles.   Cardiovascular:  Regular rate and rhythm, S1/S2, without murmur, rub or gallop.  There was no pedal edema.   GI:  Positive bowel sounds.  Unable to fully palpate left upper quadrant mass.  No tenderness with palpation reported today. Musculoskeletal:  no spinal tenderness of palpation of vertebral spine.   Skin exam was without echymosis, petichae.   Neuro exam was nonfocal. Patient was alert and oriented.  Attention was good.    Language was appropriate.  Mood was normal without depression.  Speech was not pressured.  Thought content was not tangential.     Lab Results  Component Value Date   WBC 17.9 (H) 05/29/2020   HGB 9.8 (L) 05/29/2020   HCT 30.3 (L) 05/29/2020   PLT 351 05/29/2020   GLUCOSE 141 (H) 05/29/2020   CHOL  12/31/2009    160        ATP III CLASSIFICATION:  <200     mg/dL   Desirable  200-239  mg/dL   Borderline High  >=240    mg/dL   High          TRIG 213 (H) 12/31/2009   HDL 28 (L) 12/31/2009   LDLCALC  12/31/2009    89        Total Cholesterol/HDL:CHD Risk Coronary Heart Disease Risk Table                     Men   Women  1/2 Average Risk   3.4   3.3  Average Risk       5.0   4.4  2 X Average Risk   9.6   7.1  3 X Average Risk  23.4   11.0        Use the calculated Patient Ratio above and the CHD Risk Table to determine the patient's CHD Risk.        ATP III CLASSIFICATION (LDL):  <100     mg/dL   Optimal  100-129  mg/dL   Near or Above                    Optimal  130-159  mg/dL   Borderline  160-189  mg/dL   High  >190     mg/dL   Very High   ALT 34 05/29/2020   AST 29 05/29/2020   NA 139 05/29/2020   K 4.0 05/29/2020   CL 109 05/29/2020   CREATININE 1.09 05/29/2020   BUN 27 (H) 05/29/2020   CO2 24 05/29/2020    CT Biopsy  Result Date: 05/08/2020 CLINICAL DATA:  13 cm left-sided mesenteric abdominal mass. EXAM: CT GUIDED CORE BIOPSY OF MESENTERIC MASS ANESTHESIA/SEDATION: 1.5 mg IV Versed; 75 mcg IV Fentanyl Total Moderate Sedation Time:  18 minutes. The patient's level of consciousness and physiologic status were continuously monitored during the procedure by Radiology nursing. PROCEDURE: The procedure risks, benefits, and alternatives were explained to the patient. Questions regarding the procedure were encouraged and answered. The patient understands and consents to the procedure. A time-out was performed prior to initiating the procedure. CT was  performed through  the abdomen in a supine position. The left abdominal wall was prepped with chlorhexidine in a sterile fashion, and a sterile drape was applied covering the operative field. A sterile gown and sterile gloves were used for the procedure. Local anesthesia was provided with 1% Lidocaine. Under CT guidance, a 17 gauge trocar needle was advanced to the level of a left-sided mesenteric mass. After confirming needle tip position, 4 separate coaxial 18 gauge core biopsy samples were obtained and submitted in saline. Some Gel-Foam pledgets were advanced through the outer needle as the needle was retracted and removed. COMPLICATIONS: None FINDINGS: Large left-sided intra-abdominal mass within the mesentery measures up to 13.3 cm in maximum diameter. Solid tissue was obtained. IMPRESSION: CT-guided core biopsy performed of large left-sided mesenteric mass measuring just over 13 cm in estimated maximal diameter. Electronically Signed   By: Aletta Edouard M.D.   On: 05/08/2020 15:02   ECHOCARDIOGRAM COMPLETE  Result Date: 05/22/2020    ECHOCARDIOGRAM REPORT   Patient Name:   CARLUS STAY [facility] Date of Exam: 05/22/2020 Medical Rec #:  [id number]        Height:       68.0 in Accession #:    [id number]       Weight:       171.0 lb Date of Birth:  14-Feb-1946        BSA:          1.912 m Patient Age:    36 years         BP:           116/77 mmHg Patient Gender: M                HR:           80 bpm. Exam Location:  Outpatient Procedure: 2D Echo, Cardiac Doppler and Color Doppler Indications:    Chemotherapy evaluation v87.41 / v58.11  History:        Patient has no prior history of Echocardiogram examinations. TIA                 and Stroke; Risk Factors:Hypertension, Dyslipidemia and GERD.  Sonographer:    Bernadene Person RDCS Referring Phys: [id number] [place]  1. Left ventricular ejection fraction, by estimation, is 60 to 65%. The left ventricle has normal function. The left ventricle has no regional wall motion  abnormalities. Left ventricular diastolic parameters are consistent with Grade I diastolic dysfunction (impaired relaxation). The average left ventricular global longitudinal strain is -17.6 %. The global longitudinal strain is normal.  2. Right ventricular systolic function is normal. The right ventricular size is normal.  3. The mitral valve is normal in structure. No evidence of mitral valve regurgitation. No evidence of mitral stenosis.  4. The aortic valve is normal in structure. Aortic valve regurgitation is not visualized. No aortic stenosis is present.  5. The inferior vena cava is normal in size with greater than 50% respiratory variability, suggesting right atrial pressure of 3 mmHg. FINDINGS  Left Ventricle: Left ventricular ejection fraction, by estimation, is 60 to 65%. The left ventricle has normal function. The left ventricle has no regional wall motion abnormalities. The average left ventricular global longitudinal strain is -17.6 %. The global longitudinal strain is normal. The left ventricular internal cavity size was normal in size. There is no left ventricular hypertrophy. Left ventricular diastolic parameters are consistent with Grade I diastolic dysfunction (impaired relaxation). Right Ventricle: The right ventricular size  is normal. No increase in right ventricular wall thickness. Right ventricular systolic function is normal. Left Atrium: Left atrial size was normal in size. Right Atrium: Right atrial size was normal in size. Pericardium: There is no evidence of pericardial effusion. Mitral Valve: The mitral valve is normal in structure. Normal mobility of the mitral valve leaflets. No evidence of mitral valve regurgitation. No evidence of mitral valve stenosis. Tricuspid Valve: The tricuspid valve is normal in structure. Tricuspid valve regurgitation is not demonstrated. No evidence of tricuspid stenosis. Aortic Valve: The aortic valve is normal in structure. Aortic valve regurgitation is  not visualized. No aortic stenosis is present. Pulmonic Valve: The pulmonic valve was normal in structure. Pulmonic valve regurgitation is trivial. No evidence of pulmonic stenosis. Aorta: The aortic root is normal in size and structure. Venous: The inferior vena cava is normal in size with greater than 50% respiratory variability, suggesting right atrial pressure of 3 mmHg. IAS/Shunts: No atrial level shunt detected by color flow Doppler.  LEFT VENTRICLE PLAX 2D LVIDd:         5.10 cm  Diastology LVIDs:         3.20 cm  LV e' lateral:   9.94 cm/s LV PW:         0.70 cm  LV E/e' lateral: 4.6 LV IVS:        0.70 cm  LV e' medial:    6.85 cm/s LVOT diam:     2.10 cm  LV E/e' medial:  6.6 LV SV:         81 LV SV Index:   43       2D Longitudinal Strain LVOT Area:     3.46 cm 2D Strain GLS Avg:     -17.6 %  RIGHT VENTRICLE RV S prime:     9.79 cm/s TAPSE (M-mode): 1.6 cm LEFT ATRIUM             Index       RIGHT ATRIUM           Index LA diam:        3.70 cm 1.94 cm/m  RA Area:     14.40 cm LA Vol (A2C):   69.5 ml 36.35 ml/m RA Volume:   31.90 ml  16.68 ml/m LA Vol (A4C):   51.0 ml 26.67 ml/m LA Biplane Vol: 59.4 ml 31.07 ml/m  AORTIC VALVE LVOT Vmax:   143.00 cm/s LVOT Vmean:  98.900 cm/s LVOT VTI:    0.235 m  AORTA Ao Root diam: 3.20 cm Ao Asc diam:  3.40 cm MITRAL VALVE MV Area (PHT): 2.22 cm    SHUNTS MV Decel Time: 342 msec    Systemic VTI:  0.24 m MV E velocity: 45.30 cm/s  Systemic Diam: 2.10 cm MV A velocity: 72.50 cm/s MV E/A ratio:  0.62 Candee Furbish MD Electronically signed by Candee Furbish MD Signature Date/Time: 05/22/2020/12:55:40 PM    Final    IR IMAGING GUIDED PORT INSERTION  Result Date: 05/19/2020 INDICATION: History of diffuse large B-cell lymphoma. In need of durable intravenous access for chemotherapy administration EXAM: IMPLANTED PORT A CATH PLACEMENT WITH ULTRASOUND AND FLUOROSCOPIC GUIDANCE COMPARISON:  PET-CT-04/28/2020 MEDICATIONS: Ancef 2 gm IV; The antibiotic was administered within  an appropriate time interval prior to skin puncture. ANESTHESIA/SEDATION: Moderate (conscious) sedation was employed during this procedure. A total of Versed 2 mg and Fentanyl 100 mcg was administered intravenously. Moderate Sedation Time: 25 minutes. The patient's level of consciousness and vital signs  were monitored continuously by radiology nursing throughout the procedure under my direct supervision. CONTRAST:  None FLUOROSCOPY TIME:  24 seconds (5 mGy) COMPLICATIONS: None immediate. PROCEDURE: The procedure, risks, benefits, and alternatives were explained to the patient. Questions regarding the procedure were encouraged and answered. The patient understands and consents to the procedure. The right neck and chest were prepped with chlorhexidine in a sterile fashion, and a sterile drape was applied covering the operative field. Maximum barrier sterile technique with sterile gowns and gloves were used for the procedure. A timeout was performed prior to the initiation of the procedure. Local anesthesia was provided with 1% lidocaine with epinephrine. After creating a small venotomy incision, a micropuncture kit was utilized to access the internal jugular vein. Real-time ultrasound guidance was utilized for vascular access including the acquisition of a permanent ultrasound image documenting patency of the accessed vessel. The microwire was utilized to measure appropriate catheter length. A subcutaneous port pocket was then created along the upper chest wall utilizing a combination of sharp and blunt dissection. The pocket was irrigated with sterile saline. A single lumen "Slim" sized power injectable port was chosen for placement. The 8 Fr catheter was tunneled from the port pocket site to the venotomy incision. The port was placed in the pocket. The external catheter was trimmed to appropriate length. At the venotomy, an 8 Fr peel-away sheath was placed over a guidewire under fluoroscopic guidance. The catheter  was then placed through the sheath and the sheath was removed. Final catheter positioning was confirmed and documented with a fluoroscopic spot radiograph. The port was accessed with a Huber needle, aspirated and flushed with heparinized saline. The venotomy site was closed with an interrupted 4-0 Vicryl suture. The port pocket incision was closed with interrupted 2-0 Vicryl suture. The skin was opposed with a running subcuticular 4-0 Vicryl suture. Dermabond and Steri-strips were applied to both incisions. Dressings were applied. The patient tolerated the procedure well without immediate post procedural complication. FINDINGS: After catheter placement, the tip lies within the superior cavoatrial junction. The catheter aspirates and flushes normally and is ready for immediate use. IMPRESSION: Successful placement of a right internal jugular approach power injectable Port-A-Cath. The catheter is ready for immediate use. Electronically Signed   By: Sandi Mariscal M.D.   On: 05/19/2020 15:59     CT Biopsy  Result Date: 05/08/2020 CLINICAL DATA:  13 cm left-sided mesenteric abdominal mass. EXAM: CT GUIDED CORE BIOPSY OF MESENTERIC MASS ANESTHESIA/SEDATION: 1.5 mg IV Versed; 75 mcg IV Fentanyl Total Moderate Sedation Time:  18 minutes. The patient's level of consciousness and physiologic status were continuously monitored during the procedure by Radiology nursing. PROCEDURE: The procedure risks, benefits, and alternatives were explained to the patient. Questions regarding the procedure were encouraged and answered. The patient understands and consents to the procedure. A time-out was performed prior to initiating the procedure. CT was performed through the abdomen in a supine position. The left abdominal wall was prepped with chlorhexidine in a sterile fashion, and a sterile drape was applied covering the operative field. A sterile gown and sterile gloves were used for the procedure. Local anesthesia was provided with  1% Lidocaine. Under CT guidance, a 17 gauge trocar needle was advanced to the level of a left-sided mesenteric mass. After confirming needle tip position, 4 separate coaxial 18 gauge core biopsy samples were obtained and submitted in saline. Some Gel-Foam pledgets were advanced through the outer needle as the needle was retracted and removed. COMPLICATIONS: None FINDINGS:  Large left-sided intra-abdominal mass within the mesentery measures up to 13.3 cm in maximum diameter. Solid tissue was obtained. IMPRESSION: CT-guided core biopsy performed of large left-sided mesenteric mass measuring just over 13 cm in estimated maximal diameter. Electronically Signed   By: Aletta Edouard M.D.   On: 05/08/2020 15:02   ECHOCARDIOGRAM COMPLETE  Result Date: 05/22/2020    ECHOCARDIOGRAM REPORT   Patient Name:   QUANTRELL SPLITT [facility] Date of Exam: 05/22/2020 Medical Rec #:  [id number]        Height:       68.0 in Accession #:    [id number]       Weight:       171.0 lb Date of Birth:  1946-06-15        BSA:          1.912 m Patient Age:    72 years         BP:           116/77 mmHg Patient Gender: M                HR:           80 bpm. Exam Location:  Outpatient Procedure: 2D Echo, Cardiac Doppler and Color Doppler Indications:    Chemotherapy evaluation v87.41 / v58.11  History:        Patient has no prior history of Echocardiogram examinations. TIA                 and Stroke; Risk Factors:Hypertension, Dyslipidemia and GERD.  Sonographer:    Bernadene Person RDCS Referring Phys: [id number] [place]  1. Left ventricular ejection fraction, by estimation, is 60 to 65%. The left ventricle has normal function. The left ventricle has no regional wall motion abnormalities. Left ventricular diastolic parameters are consistent with Grade I diastolic dysfunction (impaired relaxation). The average left ventricular global longitudinal strain is -17.6 %. The global longitudinal strain is normal.  2. Right ventricular systolic  function is normal. The right ventricular size is normal.  3. The mitral valve is normal in structure. No evidence of mitral valve regurgitation. No evidence of mitral stenosis.  4. The aortic valve is normal in structure. Aortic valve regurgitation is not visualized. No aortic stenosis is present.  5. The inferior vena cava is normal in size with greater than 50% respiratory variability, suggesting right atrial pressure of 3 mmHg. FINDINGS  Left Ventricle: Left ventricular ejection fraction, by estimation, is 60 to 65%. The left ventricle has normal function. The left ventricle has no regional wall motion abnormalities. The average left ventricular global longitudinal strain is -17.6 %. The global longitudinal strain is normal. The left ventricular internal cavity size was normal in size. There is no left ventricular hypertrophy. Left ventricular diastolic parameters are consistent with Grade I diastolic dysfunction (impaired relaxation). Right Ventricle: The right ventricular size is normal. No increase in right ventricular wall thickness. Right ventricular systolic function is normal. Left Atrium: Left atrial size was normal in size. Right Atrium: Right atrial size was normal in size. Pericardium: There is no evidence of pericardial effusion. Mitral Valve: The mitral valve is normal in structure. Normal mobility of the mitral valve leaflets. No evidence of mitral valve regurgitation. No evidence of mitral valve stenosis. Tricuspid Valve: The tricuspid valve is normal in structure. Tricuspid valve regurgitation is not demonstrated. No evidence of tricuspid stenosis. Aortic Valve: The aortic valve is normal in structure. Aortic valve regurgitation is not visualized. No  aortic stenosis is present. Pulmonic Valve: The pulmonic valve was normal in structure. Pulmonic valve regurgitation is trivial. No evidence of pulmonic stenosis. Aorta: The aortic root is normal in size and structure. Venous: The inferior vena cava  is normal in size with greater than 50% respiratory variability, suggesting right atrial pressure of 3 mmHg. IAS/Shunts: No atrial level shunt detected by color flow Doppler.  LEFT VENTRICLE PLAX 2D LVIDd:         5.10 cm  Diastology LVIDs:         3.20 cm  LV e' lateral:   9.94 cm/s LV PW:         0.70 cm  LV E/e' lateral: 4.6 LV IVS:        0.70 cm  LV e' medial:    6.85 cm/s LVOT diam:     2.10 cm  LV E/e' medial:  6.6 LV SV:         81 LV SV Index:   43       2D Longitudinal Strain LVOT Area:     3.46 cm 2D Strain GLS Avg:     -17.6 %  RIGHT VENTRICLE RV S prime:     9.79 cm/s TAPSE (M-mode): 1.6 cm LEFT ATRIUM             Index       RIGHT ATRIUM           Index LA diam:        3.70 cm 1.94 cm/m  RA Area:     14.40 cm LA Vol (A2C):   69.5 ml 36.35 ml/m RA Volume:   31.90 ml  16.68 ml/m LA Vol (A4C):   51.0 ml 26.67 ml/m LA Biplane Vol: 59.4 ml 31.07 ml/m  AORTIC VALVE LVOT Vmax:   143.00 cm/s LVOT Vmean:  98.900 cm/s LVOT VTI:    0.235 m  AORTA Ao Root diam: 3.20 cm Ao Asc diam:  3.40 cm MITRAL VALVE MV Area (PHT): 2.22 cm    SHUNTS MV Decel Time: 342 msec    Systemic VTI:  0.24 m MV E velocity: 45.30 cm/s  Systemic Diam: 2.10 cm MV A velocity: 72.50 cm/s MV E/A ratio:  0.62 Candee Furbish MD Electronically signed by Candee Furbish MD Signature Date/Time: 05/22/2020/12:55:40 PM    Final    IR IMAGING GUIDED PORT INSERTION  Result Date: 05/19/2020 INDICATION: History of diffuse large B-cell lymphoma. In need of durable intravenous access for chemotherapy administration EXAM: IMPLANTED PORT A CATH PLACEMENT WITH ULTRASOUND AND FLUOROSCOPIC GUIDANCE COMPARISON:  PET-CT-04/28/2020 MEDICATIONS: Ancef 2 gm IV; The antibiotic was administered within an appropriate time interval prior to skin puncture. ANESTHESIA/SEDATION: Moderate (conscious) sedation was employed during this procedure. A total of Versed 2 mg and Fentanyl 100 mcg was administered intravenously. Moderate Sedation Time: 25 minutes. The patient's  level of consciousness and vital signs were monitored continuously by radiology nursing throughout the procedure under my direct supervision. CONTRAST:  None FLUOROSCOPY TIME:  24 seconds (5 mGy) COMPLICATIONS: None immediate. PROCEDURE: The procedure, risks, benefits, and alternatives were explained to the patient. Questions regarding the procedure were encouraged and answered. The patient understands and consents to the procedure. The right neck and chest were prepped with chlorhexidine in a sterile fashion, and a sterile drape was applied covering the operative field. Maximum barrier sterile technique with sterile gowns and gloves were used for the procedure. A timeout was performed prior to the initiation of the procedure. Local anesthesia was  provided with 1% lidocaine with epinephrine. After creating a small venotomy incision, a micropuncture kit was utilized to access the internal jugular vein. Real-time ultrasound guidance was utilized for vascular access including the acquisition of a permanent ultrasound image documenting patency of the accessed vessel. The microwire was utilized to measure appropriate catheter length. A subcutaneous port pocket was then created along the upper chest wall utilizing a combination of sharp and blunt dissection. The pocket was irrigated with sterile saline. A single lumen "Slim" sized power injectable port was chosen for placement. The 8 Fr catheter was tunneled from the port pocket site to the venotomy incision. The port was placed in the pocket. The external catheter was trimmed to appropriate length. At the venotomy, an 8 Fr peel-away sheath was placed over a guidewire under fluoroscopic guidance. The catheter was then placed through the sheath and the sheath was removed. Final catheter positioning was confirmed and documented with a fluoroscopic spot radiograph. The port was accessed with a Huber needle, aspirated and flushed with heparinized saline. The venotomy site was  closed with an interrupted 4-0 Vicryl suture. The port pocket incision was closed with interrupted 2-0 Vicryl suture. The skin was opposed with a running subcuticular 4-0 Vicryl suture. Dermabond and Steri-strips were applied to both incisions. Dressings were applied. The patient tolerated the procedure well without immediate post procedural complication. FINDINGS: After catheter placement, the tip lies within the superior cavoatrial junction. The catheter aspirates and flushes normally and is ready for immediate use. IMPRESSION: Successful placement of a right internal jugular approach power injectable Port-A-Cath. The catheter is ready for immediate use. Electronically Signed   By: Sandi Mariscal M.D.   On: 05/19/2020 15:59    Assessment and Plan:  Connor Adams 74 y.o. male with medical history significant for Stage I DLBCL who presents for a follow up visit.  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 Recommend for him to proceed with EPOCH-R as an inpatient starting 05/27/2020.  Today he is Cycle 1 Day 4 of treatment.  He is tolerating his chemotherapy well so far with no nausea or vomiting.  Bowels are moving and his appetite has improved.  There are no barriers to proceeding with treatment.      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 Stage I bulky disease.   --Continue EPOCH-R. Today is Cycle 1 Day 4 --Adverse effects have been reviewed including, but not limited to, alopecia, myelosuppression, peripheral neuropathy, mucositis, liver/renal dysfunction, and possible infusion reaction associated with Rituxan. He agrees to  proceed. --PAC in place and ready for use. --Echocardiogram from 05/22/2020 shows LVEF of 60-65%  --Labs adequate to proceed with treatment today.  Leukocytosis likely related to steroids. --Will monitor closely with CBC with diff, CMET, LDH, and uric acid --continue Allopurinol 300 mg daily. Prescription sent to his pharmacy for him to continue as an outpatient.  --Lovenox for DVT prophylaxis. --Continue home medications for HTN and hyperlipidemia.  --Continue PRN antiemetics. --Continue Senokot.  Follow-up 06/02/2020 at the [place] [facility] for outpatient Rituxan and Neulasta.  Will arrange outpatient follow-up at the [facility] with Dr. Lorenso Courier.  Erasmo Downer  [place] Bryngelson, DNP, AGPCNP-BC, AOCNP Mon/Tues/Thurs/Fri 7am-5pm; Off Wednesdays Cell: [phone]

## 2020-05-30 NOTE — Progress Notes (Signed)
   05/30/20 1334  Assess: MEWS Score  Temp (!) 97.4 F (36.3 C)  BP 127/68  Pulse Rate (!) 44  Resp 11  SpO2 99 %  O2 Device Room Air  Assess: MEWS Score  MEWS Temp 0  MEWS Systolic 0  MEWS Pulse 1  MEWS RR 1  MEWS LOC 0  MEWS Score 2  MEWS Score Color Yellow  Assess: if the MEWS score is Yellow or Red  Were vital signs taken at a resting state? Yes  Focused Assessment No change from prior assessment  Early Detection of Sepsis Score *See Row Information* Low  MEWS guidelines implemented *See Row Information* Yes  Treat  MEWS Interventions Escalated (See documentation below)  Take Vital Signs  Increase Vital Sign Frequency  Yellow: Q 2hr X 2 then Q 4hr X 2, if remains yellow, continue Q 4hrs  Escalate  MEWS: Escalate Yellow: discuss with charge nurse/RN and consider discussing with provider and RRT  Notify: Charge Nurse/RN  Name of Charge Nurse/RN Notified Duanne Moron  Date Charge Nurse/RN Notified 05/30/20  Time Charge Nurse/RN Notified 1345  Notify: Provider  Provider Name/Title Quentin Cornwall  Date Provider Notified 05/30/20  Time Provider Notified 1430  Notification Type Call  Notification Reason Change in status  Response See new orders  Date of Provider Response 05/30/20  Time of Provider Response 1430  Document  Patient Outcome Other (Comment) (EKG - sinus brady)  Progress note created (see row info) Yes

## 2020-05-31 LAB — CBC WITH DIFFERENTIAL/PLATELET
Abs Immature Granulocytes: 0.07 10*3/uL (ref 0.00–0.07)
Basophils Absolute: 0 10*3/uL (ref 0.0–0.1)
Basophils Relative: 0 %
Eosinophils Absolute: 0 10*3/uL (ref 0.0–0.5)
Eosinophils Relative: 0 %
HCT: 29.9 % — ABNORMAL LOW (ref 39.0–52.0)
Hemoglobin: 9.6 g/dL — ABNORMAL LOW (ref 13.0–17.0)
Immature Granulocytes: 1 %
Lymphocytes Relative: 7 %
Lymphs Abs: 0.5 10*3/uL — ABNORMAL LOW (ref 0.7–4.0)
MCH: 29.1 pg (ref 26.0–34.0)
MCHC: 32.1 g/dL (ref 30.0–36.0)
MCV: 90.6 fL (ref 80.0–100.0)
Monocytes Absolute: 0.1 10*3/uL (ref 0.1–1.0)
Monocytes Relative: 1 %
Neutro Abs: 6.2 10*3/uL (ref 1.7–7.7)
Neutrophils Relative %: 91 %
Platelets: 316 10*3/uL (ref 150–400)
RBC: 3.3 MIL/uL — ABNORMAL LOW (ref 4.22–5.81)
RDW: 13.3 % (ref 11.5–15.5)
WBC: 6.9 10*3/uL (ref 4.0–10.5)
nRBC: 0 % (ref 0.0–0.2)

## 2020-05-31 LAB — COMPREHENSIVE METABOLIC PANEL
ALT: 27 U/L (ref 0–44)
AST: 17 U/L (ref 15–41)
Albumin: 2.6 g/dL — ABNORMAL LOW (ref 3.5–5.0)
Alkaline Phosphatase: 42 U/L (ref 38–126)
Anion gap: 11 (ref 5–15)
BUN: 27 mg/dL — ABNORMAL HIGH (ref 8–23)
CO2: 25 mmol/L (ref 22–32)
Calcium: 8.2 mg/dL — ABNORMAL LOW (ref 8.9–10.3)
Chloride: 106 mmol/L (ref 98–111)
Creatinine, Ser: 0.8 mg/dL (ref 0.61–1.24)
GFR calc Af Amer: >60 mL/min (ref >60)
GFR calc non Af Amer: >60 mL/min (ref >60)
Glucose, Bld: 120 mg/dL — ABNORMAL HIGH (ref 70–99)
Potassium: 4 mmol/L (ref 3.5–5.1)
Sodium: 142 mmol/L (ref 135–145)
Total Bilirubin: 0.3 mg/dL (ref 0.3–1.2)
Total Protein: 4.8 g/dL — ABNORMAL LOW (ref 6.5–8.1)

## 2020-05-31 LAB — URIC ACID: Uric Acid, Serum: 5.3 mg/dL (ref 3.7–8.6)

## 2020-05-31 LAB — LACTATE DEHYDROGENASE: LDH: 236 U/L — ABNORMAL HIGH (ref 98–192)

## 2020-05-31 MED ORDER — HEPARIN SOD (PORK) LOCK FLUSH 100 UNIT/ML IV SOLN
500.0000 [IU] | Freq: Once | INTRAVENOUS | Status: AC
  Administered 2020-05-31: 500 [IU]
  Filled 2020-05-31: qty 5

## 2020-05-31 NOTE — Discharge Summary (Signed)
Physician Discharge Summary  Patient ID: Connor Adams MRN: [id number] DOB/AGE: 22-Jun-1946 74 y.o.  Admit date: 05/27/2020 Discharge date: 05/31/2020  Discharge Diagnoses:  Active Problems:   DLBCL (diffuse large B cell lymphoma) (HCC)  Discharged Condition: good  Discharge Labs:  None pending.   Significant Diagnostic Studies:   CBC Latest Ref Rng & Units 05/31/2020 05/30/2020 05/29/2020  WBC 4.0 - 10.5 K/uL 6.9 10.6(H) 17.9(H)  Hemoglobin 13.0 - 17.0 g/dL 9.6(L) 9.1(L) 9.8(L)  Hematocrit 39 - 52 % 29.9(L) 28.9(L) 30.3(L)  Platelets 150 - 400 K/uL 316 304 351   CMP Latest Ref Rng & Units 05/31/2020 05/30/2020 05/29/2020  Glucose 70 - 99 mg/dL 120(H) 117(H) 141(H)  BUN 8 - 23 mg/dL 27(H) 26(H) 27(H)  Creatinine 0.61 - 1.24 mg/dL 0.80 0.91 1.09  Sodium 135 - 145 mmol/L 142 142 139  Potassium 3.5 - 5.1 mmol/L 4.0 3.9 4.0  Chloride 98 - 111 mmol/L 106 109 109  CO2 22 - 32 mmol/L 25 24 24   Calcium 8.9 - 10.3 mg/dL 8.2(L) 8.3(L) 7.9(L)  Total Protein 6.5 - 8.1 g/dL 4.8(L) 5.6(L) 5.3(L)  Total Bilirubin 0.3 - 1.2 mg/dL 0.3 0.4 0.2(L)  Alkaline Phos 38 - 126 U/L 42 42 48  AST 15 - 41 U/L 17 25 29   ALT 0 - 44 U/L 27 33 34   Consults: None  Procedures: None  Disposition: Discharge disposition: 01-Home or Self Care      Day of Discharge Physical Exam:  General:  well-nourished in no acute distress.   Eyes:  no scleral icterus.   Respiratory: lungs were clear bilaterally without wheezing or crackles.   Cardiovascular:  Regular rate and rhythm, S1/S2, without murmur, rub or gallop.  There was no pedal edema.   GI:  Positive bowel sounds. Palpable mass in the LUQ measures approximately 10 cm.   Musculoskeletal:  no spinal tenderness of palpation of vertebral spine.   Skin exam was without echymosis, petichae.   Neuro exam was nonfocal. Patient was alert and oriented.  Attention was good.   Language was appropriate.  Mood was normal without depression.  Speech was not pressured.   Thought content was not tangential.    Allergies as of 05/31/2020   No Known Allergies     Medication List    STOP taking these medications   aspirin 325 MG tablet     TAKE these medications   allopurinol 300 MG tablet Commonly known as: ZYLOPRIM Take 1 tablet (300 mg total) by mouth daily.   Cozaar 25 MG tablet Generic drug: losartan Take 25 mg by mouth daily.   Crestor 5 MG tablet Generic drug: rosuvastatin Take 5 mg by mouth daily.   hydrocortisone 2.5 % rectal cream Commonly known as: ANUSOL-HC Place 1 application rectally daily as needed. What changed: reasons to take this   multivitamins ther. w/minerals Tabs tablet Take 1 tablet by mouth daily.   ondansetron 8 MG disintegrating tablet Commonly known as: Zofran ODT Take 1 tablet (8 mg total) by mouth every 8 (eight) hours as needed for nausea or vomiting.   pantoprazole 40 MG tablet Commonly known as: PROTONIX Take 1 tablet (40 mg total) by mouth 2 (two) times daily.   polyethylene glycol 17 g packet Commonly known as: MIRALAX / GLYCOLAX Take 17 g by mouth daily as needed for moderate constipation.   prochlorperazine 10 MG tablet Commonly known as: COMPAZINE Take 1 tablet (10 mg total) by mouth every 6 (six) hours as needed for  nausea or vomiting.   promethazine 25 MG tablet Commonly known as: PHENERGAN Take 1 tablet (25 mg total) by mouth every 6 (six) hours as needed for nausea or vomiting.   senna-docusate 8.6-50 MG tablet Commonly known as: Senokot-S Take 2 tablets by mouth 2 (two) times daily as needed for mild constipation.   sodium bicarbonate/sodium chloride Soln 1 application by Mouth Rinse route as needed for dry mouth.   sucralfate 1 g tablet Commonly known as: Carafate Take 1 tablet (1 g total) by mouth 4 (four) times daily -  with meals and at bedtime.        Hospital Course: Connor Adams is a 74 year old male with medical history significant for double hit DLBCL. He presented on  05/27/2020 for Cycle 1 Day 1 of EPOCH-R chemotherapy.  He received chemotherapy education upon admission.  Therapy started on 05/27/2020.  Throughout the course of the patient's admission he has had no complications.  He had daily bowel movements and was able to tolerate food well without difficulty.  He did not have any issues with fevers, chills, sweats, nausea, vomiting or diarrhea.  He successfully completed all chemotherapy at full dose as prescribed.  On the final day he did have some lower extremity edema, but no other concerning symptoms.  He is currently planned for a Udenyca shot and rituximab therapy to be administered at [facility] on 06/02/2020.  Additionally he will be seen again in our clinic for follow-up on 06/04/2020.  He has our contact information and has been instructed to contact us in the event he were to develop any new concerning symptoms or issues in the interim.  He was discharged on 05/31/2020 in excellent condition.   Discharge Instructions    Discharge patient   Complete by: As directed    Discharge disposition: 01-Home or Self Care   Discharge patient date: 05/31/2020      Signed:  [name], MD Department of Hematology/Oncology [place] at [facility] Phone: [phone] Pager: [phone] Email: [email or web address]

## 2020-05-31 NOTE — Progress Notes (Signed)
Patient discharged to home with family, discharge instructions reviewed with patient who verbalized understanding. 

## 2020-06-01 ENCOUNTER — Other Ambulatory Visit: Payer: Self-pay | Admitting: Hematology and Oncology

## 2020-06-01 DIAGNOSIS — C8333 Diffuse large B-cell lymphoma, intra-abdominal lymph nodes: Secondary | ICD-10-CM

## 2020-06-02 ENCOUNTER — Inpatient Hospital Stay: Payer: Medicare PPO

## 2020-06-02 ENCOUNTER — Other Ambulatory Visit: Payer: Self-pay | Admitting: Hematology & Oncology

## 2020-06-02 ENCOUNTER — Other Ambulatory Visit: Payer: Self-pay

## 2020-06-02 ENCOUNTER — Other Ambulatory Visit: Payer: Self-pay | Admitting: *Deleted

## 2020-06-02 VITALS — BP 115/61 | HR 63 | Temp 98.0°F | Resp 17

## 2020-06-02 DIAGNOSIS — C8333 Diffuse large B-cell lymphoma, intra-abdominal lymph nodes: Secondary | ICD-10-CM

## 2020-06-02 DIAGNOSIS — C8338 Diffuse large B-cell lymphoma, lymph nodes of multiple sites: Secondary | ICD-10-CM | POA: Diagnosis not present

## 2020-06-02 DIAGNOSIS — Z5111 Encounter for antineoplastic chemotherapy: Secondary | ICD-10-CM | POA: Diagnosis not present

## 2020-06-02 DIAGNOSIS — Z79899 Other long term (current) drug therapy: Secondary | ICD-10-CM | POA: Diagnosis not present

## 2020-06-02 DIAGNOSIS — R918 Other nonspecific abnormal finding of lung field: Secondary | ICD-10-CM | POA: Diagnosis not present

## 2020-06-02 LAB — CBC WITH DIFFERENTIAL (CANCER CENTER ONLY)
Abs Immature Granulocytes: 0.14 10*3/uL — ABNORMAL HIGH (ref 0.00–0.07)
Basophils Absolute: 0 10*3/uL (ref 0.0–0.1)
Basophils Relative: 0 %
Eosinophils Absolute: 0.1 10*3/uL (ref 0.0–0.5)
Eosinophils Relative: 2 %
HCT: 33.7 % — ABNORMAL LOW (ref 39.0–52.0)
Hemoglobin: 11 g/dL — ABNORMAL LOW (ref 13.0–17.0)
Immature Granulocytes: 2 %
Lymphocytes Relative: 8 %
Lymphs Abs: 0.6 10*3/uL — ABNORMAL LOW (ref 0.7–4.0)
MCH: 28.7 pg (ref 26.0–34.0)
MCHC: 32.6 g/dL (ref 30.0–36.0)
MCV: 88 fL (ref 80.0–100.0)
Monocytes Absolute: 0 10*3/uL — ABNORMAL LOW (ref 0.1–1.0)
Monocytes Relative: 0 %
Neutro Abs: 6.7 10*3/uL (ref 1.7–7.7)
Neutrophils Relative %: 88 %
Platelet Count: 303 10*3/uL (ref 150–400)
RBC: 3.83 MIL/uL — ABNORMAL LOW (ref 4.22–5.81)
RDW: 13.4 % (ref 11.5–15.5)
WBC Count: 7.6 10*3/uL (ref 4.0–10.5)
nRBC: 0 % (ref 0.0–0.2)

## 2020-06-02 LAB — HEPATITIS B SURFACE ANTIBODY,QUALITATIVE: Hep B S Ab: REACTIVE — AB

## 2020-06-02 LAB — HEPATITIS B SURFACE ANTIGEN: Hepatitis B Surface Ag: NONREACTIVE

## 2020-06-02 LAB — CMP (CANCER CENTER ONLY)
ALT: 19 U/L (ref 0–44)
AST: 14 U/L — ABNORMAL LOW (ref 15–41)
Albumin: 3 g/dL — ABNORMAL LOW (ref 3.5–5.0)
Alkaline Phosphatase: 43 U/L (ref 38–126)
Anion gap: 7 (ref 5–15)
BUN: 28 mg/dL — ABNORMAL HIGH (ref 8–23)
CO2: 31 mmol/L (ref 22–32)
Calcium: 8.5 mg/dL — ABNORMAL LOW (ref 8.9–10.3)
Chloride: 104 mmol/L (ref 98–111)
Creatinine: 1.13 mg/dL (ref 0.61–1.24)
GFR, Est AFR Am: >60 mL/min (ref >60)
GFR, Estimated: >60 mL/min (ref >60)
Glucose, Bld: 122 mg/dL — ABNORMAL HIGH (ref 70–99)
Potassium: 3.7 mmol/L (ref 3.5–5.1)
Sodium: 142 mmol/L (ref 135–145)
Total Bilirubin: 0.5 mg/dL (ref 0.3–1.2)
Total Protein: 5 g/dL — ABNORMAL LOW (ref 6.5–8.1)

## 2020-06-02 LAB — PHOSPHORUS: Phosphorus: 3.4 mg/dL (ref 2.5–4.6)

## 2020-06-02 LAB — LACTATE DEHYDROGENASE: LDH: 287 U/L — ABNORMAL HIGH (ref 98–192)

## 2020-06-02 LAB — URIC ACID: Uric Acid, Serum: 4.9 mg/dL (ref 3.7–8.6)

## 2020-06-02 MED ORDER — SODIUM CHLORIDE 0.9 % IV SOLN
375.0000 mg/m2 | Freq: Once | INTRAVENOUS | Status: AC
  Administered 2020-06-02: 700 mg via INTRAVENOUS
  Filled 2020-06-02: qty 50

## 2020-06-02 MED ORDER — HEPARIN SOD (PORK) LOCK FLUSH 100 UNIT/ML IV SOLN
500.0000 [IU] | Freq: Once | INTRAVENOUS | Status: AC
  Administered 2020-06-02: 500 [IU] via INTRAVENOUS
  Filled 2020-06-02: qty 5

## 2020-06-02 MED ORDER — PEGFILGRASTIM-CBQV 6 MG/0.6ML ~~LOC~~ SOSY
PREFILLED_SYRINGE | SUBCUTANEOUS | Status: AC
  Filled 2020-06-02: qty 0.6

## 2020-06-02 MED ORDER — PEGFILGRASTIM-CBQV 6 MG/0.6ML ~~LOC~~ SOSY
6.0000 mg | PREFILLED_SYRINGE | Freq: Once | SUBCUTANEOUS | Status: AC
  Administered 2020-06-02: 6 mg via SUBCUTANEOUS

## 2020-06-02 MED ORDER — SODIUM CHLORIDE 0.9% FLUSH
10.0000 mL | Freq: Once | INTRAVENOUS | Status: AC
  Administered 2020-06-02: 10 mL via INTRAVENOUS
  Filled 2020-06-02: qty 10

## 2020-06-02 MED ORDER — SODIUM CHLORIDE 0.9 % IV SOLN
INTRAVENOUS | Status: DC
  Filled 2020-06-02 (×2): qty 250

## 2020-06-02 NOTE — Patient Instructions (Signed)
Pegfilgrastim injection What is this medicine? PEGFILGRASTIM (PEG fil gra stim) is a long-acting granulocyte colony-stimulating factor that stimulates the growth of neutrophils, a type of white blood cell important in the body's fight against infection. It is used to reduce the incidence of fever and infection in patients with certain types of cancer who are receiving chemotherapy that affects the bone marrow, and to increase survival after being exposed to high doses of radiation. This medicine may be used for other purposes; ask your health care provider or pharmacist if you have questions. COMMON BRAND NAME(S): Fulphila, Neulasta, UDENYCA, Ziextenzo What should I tell my health care provider before I take this medicine? They need to know if you have any of these conditions:  kidney disease  latex allergy  ongoing radiation therapy  sickle cell disease  skin reactions to acrylic adhesives (On-Body Injector only)  an unusual or allergic reaction to pegfilgrastim, filgrastim, other medicines, foods, dyes, or preservatives  pregnant or trying to get pregnant  breast-feeding How should I use this medicine? This medicine is for injection under the skin. If you get this medicine at home, you will be taught how to prepare and give the pre-filled syringe or how to use the On-body Injector. Refer to the patient Instructions for Use for detailed instructions. Use exactly as directed. Tell your healthcare provider immediately if you suspect that the On-body Injector may not have performed as intended or if you suspect the use of the On-body Injector resulted in a missed or partial dose. It is important that you put your used needles and syringes in a special sharps container. Do not put them in a trash can. If you do not have a sharps container, call your pharmacist or healthcare provider to get one. Talk to your pediatrician regarding the use of this medicine in children. While this drug may be  prescribed for selected conditions, precautions do apply. Overdosage: If you think you have taken too much of this medicine contact a poison control center or emergency room at once. NOTE: This medicine is only for you. Do not share this medicine with others. What if I miss a dose? It is important not to miss your dose. Call your doctor or health care professional if you miss your dose. If you miss a dose due to an On-body Injector failure or leakage, a new dose should be administered as soon as possible using a single prefilled syringe for manual use. What may interact with this medicine? Interactions have not been studied. Give your health care provider a list of all the medicines, herbs, non-prescription drugs, or dietary supplements you use. Also tell them if you smoke, drink alcohol, or use illegal drugs. Some items may interact with your medicine. This list may not describe all possible interactions. Give your health care provider a list of all the medicines, herbs, non-prescription drugs, or dietary supplements you use. Also tell them if you smoke, drink alcohol, or use illegal drugs. Some items may interact with your medicine. What should I watch for while using this medicine? You may need blood work done while you are taking this medicine. If you are going to need a MRI, CT scan, or other procedure, tell your doctor that you are using this medicine (On-Body Injector only). What side effects may I notice from receiving this medicine? Side effects that you should report to your doctor or health care professional as soon as possible:  allergic reactions like skin rash, itching or hives, swelling of the   face, lips, or tongue  back pain  dizziness  fever  pain, redness, or irritation at site where injected  pinpoint red spots on the skin  red or dark-brown urine  shortness of breath or breathing problems  stomach or side pain, or pain at the  shoulder  swelling  tiredness  trouble passing urine or change in the amount of urine Side effects that usually do not require medical attention (report to your doctor or health care professional if they continue or are bothersome):  bone pain  muscle pain This list may not describe all possible side effects. Call your doctor for medical advice about side effects. You may report side effects to FDA at 1-800-FDA-1088. Where should I keep my medicine? Keep out of the reach of children. If you are using this medicine at home, you will be instructed on how to store it. Throw away any unused medicine after the expiration date on the label. NOTE: This sheet is a summary. It may not cover all possible information. If you have questions about this medicine, talk to your doctor, pharmacist, or health care provider.  2020 [employer] Standard (2017-12-12 16:57:08) Rituximab injection What is this medicine? RITUXIMAB (ri TUX i mab) is a monoclonal antibody. It is used to treat certain types of cancer like non-Hodgkin lymphoma and chronic lymphocytic leukemia. It is also used to treat rheumatoid arthritis, granulomatosis with polyangiitis (or Wegener's granulomatosis), microscopic polyangiitis, and pemphigus vulgaris. This medicine may be used for other purposes; ask your health care provider or pharmacist if you have questions. COMMON BRAND NAME(S): Rituxan, RUXIENCE What should I tell my health care provider before I take this medicine? They need to know if you have any of these conditions:  heart disease  infection (especially a virus infection such as hepatitis B, chickenpox, cold sores, or herpes)  immune system problems  irregular heartbeat  kidney disease  low blood counts, like low white cell, platelet, or red cell counts  lung or breathing disease, like asthma  recently received or scheduled to receive a vaccine  an unusual or allergic reaction to rituximab, other medicines,  foods, dyes, or preservatives  pregnant or trying to get pregnant  breast-feeding How should I use this medicine? This medicine is for infusion into a vein. It is administered in a hospital or clinic by a specially trained health care professional. A special MedGuide will be given to you by the pharmacist with each prescription and refill. Be sure to read this information carefully each time. Talk to your pediatrician regarding the use of this medicine in children. This medicine is not approved for use in children. Overdosage: If you think you have taken too much of this medicine contact a poison control center or emergency room at once. NOTE: This medicine is only for you. Do not share this medicine with others. What if I miss a dose? It is important not to miss a dose. Call your doctor or health care professional if you are unable to keep an appointment. What may interact with this medicine?  cisplatin  live virus vaccines This list may not describe all possible interactions. Give your health care provider a list of all the medicines, herbs, non-prescription drugs, or dietary supplements you use. Also tell them if you smoke, drink alcohol, or use illegal drugs. Some items may interact with your medicine. What should I watch for while using this medicine? Your condition will be monitored carefully while you are receiving this medicine. You may need blood  work done while you are taking this medicine. This medicine can cause serious allergic reactions. To reduce your risk you may need to take medicine before treatment with this medicine. Take your medicine as directed. In some patients, this medicine may cause a serious brain infection that may cause death. If you have any problems seeing, thinking, speaking, walking, or standing, tell your healthcare professional right away. If you cannot reach your healthcare professional, urgently seek other source of medical care. Call your doctor or health  care professional for advice if you get a fever, chills or sore throat, or other symptoms of a cold or flu. Do not treat yourself. This drug decreases your body's ability to fight infections. Try to avoid being around people who are sick. Do not become pregnant while taking this medicine or for at least 12 months after stopping it. Women should inform their doctor if they wish to become pregnant or think they might be pregnant. There is a potential for serious side effects to an unborn child. Talk to your health care professional or pharmacist for more information. Do not breast-feed an infant while taking this medicine or for at least 6 months after stopping it. What side effects may I notice from receiving this medicine? Side effects that you should report to your doctor or health care professional as soon as possible:  allergic reactions like skin rash, itching or hives; swelling of the face, lips, or tongue  breathing problems  chest pain  changes in vision  diarrhea  headache with fever, neck stiffness, sensitivity to light, nausea, or confusion  fast, irregular heartbeat  loss of memory  low blood counts - this medicine may decrease the number of white blood cells, red blood cells and platelets. You may be at increased risk for infections and bleeding.  mouth sores  problems with balance, talking, or walking  redness, blistering, peeling or loosening of the skin, including inside the mouth  signs of infection - fever or chills, cough, sore throat, pain or difficulty passing urine  signs and symptoms of kidney injury like trouble passing urine or change in the amount of urine  signs and symptoms of liver injury like dark yellow or brown urine; general ill feeling or flu-like symptoms; light-colored stools; loss of appetite; nausea; right upper belly pain; unusually weak or tired; yellowing of the eyes or skin  signs and symptoms of low blood pressure like dizziness; feeling  faint or lightheaded, falls; unusually weak or tired  stomach pain  swelling of the ankles, feet, hands  unusual bleeding or bruising  vomiting Side effects that usually do not require medical attention (report to your doctor or health care professional if they continue or are bothersome):  headache  joint pain  muscle cramps or muscle pain  nausea  tiredness This list may not describe all possible side effects. Call your doctor for medical advice about side effects. You may report side effects to FDA at 1-800-FDA-1088. Where should I keep my medicine? This drug is given in a hospital or clinic and will not be stored at home. NOTE: This sheet is a summary. It may not cover all possible information. If you have questions about this medicine, talk to your doctor, pharmacist, or health care provider.  2020 [employer]/Gold Standard (2018-10-18 22:01:36)

## 2020-06-02 NOTE — Patient Instructions (Signed)

## 2020-06-02 NOTE — Progress Notes (Signed)
No pre-meds for Rituxan per Dr Lorenso Courier. dph

## 2020-06-03 ENCOUNTER — Other Ambulatory Visit: Payer: Self-pay | Admitting: Hematology and Oncology

## 2020-06-03 DIAGNOSIS — C8333 Diffuse large B-cell lymphoma, intra-abdominal lymph nodes: Secondary | ICD-10-CM

## 2020-06-03 NOTE — Progress Notes (Signed)
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   1) 04/17/2020:  CT A/P showed dominant nodal mass (11.5 x 10.9 cm) within the jejunal mesentery, highly suspicious for lymphoma. Additionally there are left lower lobe pleural-based pulmonary nodules 2) 04/24/2020: establish care with Dr. Lorenso Adams 3) 04/28/2020:  PET CT scan shows Large solid left mesenteric mass 13.0 cm with maximum SUV of 21.2, Deauville 5 with local hypermetabolic porta hepatis and retroperitoneal lymph nodes. Constitutes bulky Stage I disease.  4) 05/27/2020-05/31/2020: Cycle 1 of R-EPOCH  Interval History:  Connor Adams 74 y.o. male with medical history significant for Stage I DLBCL who presents for a follow up visit. The patient was last seen while in house from 05/27/2020-05/31/2020 for R-EPOCH. In the interim since then he received rituximab and GCSF therapy on 06/02/2020.  On exam today Connor Adams notes he has had no nausea since he started the chemotherapy.  He notes that he had a little bit of diarrhea yesterday but this was in the setting of eating too much ice cream.  Neighbor brought him over some ice cream and afterwards he had a bellyache for about 4 hours.  He notes that the stools were loose, and after the ice cream they were watery.  They have resolved in the interim since that time.  He notes he still does have difficulty with smells and his wife has purchased an air purifier in order to try to help remove the sense from the air.  Otherwise he reports no fevers, chills, sweats, shortness of breath, or chest pain.  A full 10 point ROS is listed below.  MEDICAL HISTORY:  Past Medical History:  Diagnosis Date  . Arthritis   . Chronic kidney disease (CKD), stage III (moderate)   . COVID-19   . GERD  (gastroesophageal reflux disease)    occ  . Hemorrhoids   . History of ETT 3/05   low risk  . History of hiatal hernia   . History of kidney stones   . HLD (hyperlipidemia)   . HTN (hypertension)   . Left inguinal hernia   . Melanoma (Connor Adams)    excied  . 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 Stroke Connor Adams) 2011   tia   . TIA (transient ischemic attack) 4/11   associated w slurred speecha ndmild facial droop. lasted for about 10 minutes. Had MRI, etc with Connor neurology that per his report was ok (dr. Stephannie Adams). Echo (4/11): EF 55-60%, no regional WMAs, normal diastolic function, mild LAE, no source of embolus    SURGICAL HISTORY: Past Surgical History:  Procedure Laterality Date  . BMI 30.2 muscular  12/08/04  . curretage actinic keratosis R ear  01/20/05  . ETT low prob of CAD  11/21/03  . excision melanoma in situ back  01/20/05  . hemorrhoids sclerosed at colonoscopy  12/19/05  . IR IMAGING GUIDED PORT INSERTION  05/19/2020  . LAPAROSCOPIC INGUINAL HERNIA REPAIR Left 12/20/1995  . LUMBAR DISC SURGERY    . NERVE REPAIR     back of neck  . retinal laser surgery Left 09/20/1986  . SEPTOPLASTY  11/19/98  . TOTAL KNEE ARTHROPLASTY Left 12/13/2016   Procedure: TOTAL KNEE ARTHROPLASTY WITH RIGHT KNEE CORTISONE INJECTION;  Surgeon: Connor Pear, MD;  Location: [facility];  Service:  Orthopedics;  Laterality: Left;  . uric acid 6.9  12/22/04  . x-ray l great toe  07/21/00    SOCIAL HISTORY: Social History   Socioeconomic History  . Marital status: Married    Spouse name: Connor Adams  . Number of children: 2  . Years of education: 33  . Highest education level: Not on file  Occupational History  . Occupation: Systems developer: Connor Adams  Tobacco Use  . Smoking status: Never Smoker  . Smokeless tobacco: Never Used  Vaping Use  . Vaping Use: Never used  Substance and Sexual Activity  . Alcohol use: No  . Drug use: No  . Sexual activity: Not on file  Other Topics Concern   . Not on file  Social History Narrative   Married to Connor Adams at [employer].   Son, Connor Adams married   Son, Connor Adams, Married.    Caffeine use: 1 cup coffee per day   Social Determinants of Health   Financial Resource Strain:   . Difficulty of Paying Living Expenses: Not on file  Food Insecurity:   . Worried About Charity fundraiser in the Last Year: Not on file  . Ran Out of Food in the Last Year: Not on file  Transportation Needs:   . Lack of Transportation (Medical): Not on file  . Lack of Transportation (Non-Medical): Not on file  Physical Activity:   . Days of Exercise per Week: Not on file  . Minutes of Exercise per Session: Not on file  Stress:   . Feeling of Stress : Not on file  Social Connections:   . Frequency of Communication with Friends and Family: Not on file  . Frequency of Social Gatherings with Friends and Family: Not on file  . Attends Religious Services: Not on file  . Active Member of Clubs or Organizations: Not on file  . Attends Archivist Meetings: Not on file  . Marital Status: Not on file  Intimate Partner Violence:   . Fear of Current or Ex-Partner: Not on file  . Emotionally Abused: Not on file  . Physically Abused: Not on file  . Sexually Abused: Not on file    FAMILY HISTORY: Family History  Problem Relation Age of Onset  . Liver cancer Sister   . Heart failure Brother 44       CABGx4  . Heart attack Mother 62  . Uterine cancer Mother   . Heart attack Father 99  . Asthma Son   . Diabetes Brother   . Prostate cancer Brother     ALLERGIES:  has No Known Allergies.  MEDICATIONS:  Current Outpatient Medications  Medication Sig Dispense Refill  . sucralfate (CARAFATE) 1 g tablet Take 1 tablet (1 g total) by mouth 4 (four) times daily -  with meals and at bedtime. 60 tablet 3  . allopurinol (ZYLOPRIM) 300 MG tablet Take 1 tablet (300 mg total) by mouth daily. 30 tablet 1  . hydrocortisone (ANUSOL-HC) 2.5 %  rectal cream Place 1 application rectally daily as needed. 30 g 0  . losartan (COZAAR) 25 MG tablet Take 25 mg by mouth daily.     . Multiple Vitamins-Minerals (MULTIVITAMINS THER. W/MINERALS) TABS Take 1 tablet by mouth daily.      . ondansetron (ZOFRAN ODT) 8 MG disintegrating tablet Take 1 tablet (8 mg total) by mouth every 8 (eight) hours as needed for nausea or vomiting. 30 tablet 5  .  pantoprazole (PROTONIX) 40 MG tablet Take 1 tablet (40 mg total) by mouth 2 (two) times daily. 60 tablet 5  . polyethylene glycol (MIRALAX / GLYCOLAX) 17 g packet Take 17 g by mouth daily as needed for moderate constipation. 14 each 0  . prochlorperazine (COMPAZINE) 10 MG tablet Take 1 tablet (10 mg total) by mouth every 6 (six) hours as needed for nausea or vomiting. 30 tablet 0  . promethazine (PHENERGAN) 25 MG tablet Take 1 tablet (25 mg total) by mouth every 6 (six) hours as needed for nausea or vomiting. 45 tablet 2  . rosuvastatin (CRESTOR) 5 MG tablet Take 5 mg by mouth daily.     Marland Kitchen senna-docusate (SENOKOT-S) 8.6-50 MG tablet Take 2 tablets by mouth 2 (two) times daily as needed for mild constipation.    . Sodium Chloride-Sodium Bicarb (SODIUM BICARBONATE/SODIUM CHLORIDE) SOLN 1 application by Mouth Rinse route as needed for dry mouth.     No current facility-administered medications for this visit.    REVIEW OF SYSTEMS:   Constitutional: ( - ) fevers, ( - )  chills , ( - ) night sweats Eyes: ( - ) blurriness of vision, ( - ) double vision, ( - ) watery eyes Ears, nose, mouth, throat, and face: ( - ) mucositis, ( - ) sore throat Respiratory: ( - ) cough, ( - ) dyspnea, ( - ) wheezes Cardiovascular: ( - ) palpitation, ( - ) chest discomfort, ( - ) lower extremity swelling Gastrointestinal:  ( - ) nausea, ( - ) heartburn, ( - ) change in bowel habits Skin: ( - ) abnormal skin rashes Lymphatics: ( - ) new lymphadenopathy, ( - ) easy bruising Neurological: ( - ) numbness, ( - ) tingling, ( - ) new  weaknesses Behavioral/Psych: ( - ) mood change, ( - ) new changes  All other systems were reviewed with the patient and are negative.  PHYSICAL EXAMINATION: ECOG PERFORMANCE STATUS: 1 - Symptomatic but completely ambulatory  Vitals:   06/04/20 0817  BP: 117/69  Pulse: 78  Resp: 18  Temp: 98.6 F (37 C)  SpO2: 98%   Filed Weights   06/04/20 0817  Weight: 161 lb 4.8 oz (73.2 kg)    GENERAL: well appearing elderly Caucasian male. alert, no distress and comfortable SKIN: skin color, texture, turgor are normal, no rashes or significant lesions EYES: conjunctiva are pink and non-injected, sclera clear LUNGS: clear to auscultation and percussion with normal breathing effort HEART: regular rate & rhythm and no murmurs and no lower extremity edema ABDOMEN: soft, non-tender, non-distended, normal bowel sounds. Abdominal mass is still present, but less firm and decreased in size.  Musculoskeletal: no cyanosis of digits and no clubbing  PSYCH: alert & oriented x 3, fluent speech NEURO: no focal motor/sensory deficits  LABORATORY DATA:  I have reviewed the data as listed CBC Latest Ref Rng & Units 06/04/2020 06/02/2020 05/31/2020  WBC 4.0 - 10.5 K/uL 13.9(H) 7.6 6.9  Hemoglobin 13.0 - 17.0 g/dL 10.5(L) 11.0(L) 9.6(L)  Hematocrit 39 - 52 % 32.2(L) 33.7(L) 29.9(L)  Platelets 150 - 400 K/uL 208 303 316    CMP Latest Ref Rng & Units 06/04/2020 06/02/2020 05/31/2020  Glucose 70 - 99 mg/dL 143(H) 122(H) 120(H)  BUN 8 - 23 mg/dL 17 28(H) 27(H)  Creatinine 0.61 - 1.24 mg/dL 1.03 1.13 0.80  Sodium 135 - 145 mmol/L 140 142 142  Potassium 3.5 - 5.1 mmol/L 3.5 3.7 4.0  Chloride 98 - 111 mmol/L 105 104  106  CO2 22 - 32 mmol/L $RemoveB'27 31 25  'gBsiylpS$ Calcium 8.9 - 10.3 mg/dL 8.2(L) 8.5(L) 8.2(L)  Total Protein 6.5 - 8.1 g/dL 5.4(L) 5.0(L) 4.8(L)  Total Bilirubin 0.3 - 1.2 mg/dL 0.5 0.5 0.3  Alkaline Phos 38 - 126 U/L 64 43 42  AST 15 - 41 U/L 35 14(L) 17  ALT 0 - 44 U/L $Remo'23 19 27    'SBqpC$ RADIOGRAPHIC STUDIES: I  have personally reviewed the radiological images as listed and agreed with the findings in the report: large abdominal mass consistent with biopsy proven DLBCL. Bulky tumor.  CT Biopsy  Result Date: 05/08/2020 CLINICAL DATA:  13 cm left-sided mesenteric abdominal mass. EXAM: CT GUIDED CORE BIOPSY OF MESENTERIC MASS ANESTHESIA/SEDATION: 1.5 mg IV Versed; 75 mcg IV Fentanyl Total Moderate Sedation Time:  18 minutes. The patient's level of consciousness and physiologic status were continuously monitored during the procedure by Radiology nursing. PROCEDURE: The procedure risks, benefits, and alternatives were explained to the patient. Questions regarding the procedure were encouraged and answered. The patient understands and consents to the procedure. A time-out was performed prior to initiating the procedure. CT was performed through the abdomen in a supine position. The left abdominal wall was prepped with chlorhexidine in a sterile fashion, and a sterile drape was applied covering the operative field. A sterile gown and sterile gloves were used for the procedure. Local anesthesia was provided with 1% Lidocaine. Under CT guidance, a 17 gauge trocar needle was advanced to the level of a left-sided mesenteric mass. After confirming needle tip position, 4 separate coaxial 18 gauge core biopsy samples were obtained and submitted in saline. Some Gel-Foam pledgets were advanced through the outer needle as the needle was retracted and removed. COMPLICATIONS: None FINDINGS: Large left-sided intra-abdominal mass within the mesentery measures up to 13.3 cm in maximum diameter. Solid tissue was obtained. IMPRESSION: CT-guided core biopsy performed of large left-sided mesenteric mass measuring just over 13 cm in estimated maximal diameter. Electronically Signed   By: Aletta Edouard M.D.   On: 05/08/2020 15:02   ECHOCARDIOGRAM COMPLETE  Result Date: 05/22/2020    ECHOCARDIOGRAM REPORT   Patient Name:   CLAYDEN WITHEM [facility]  Date of Exam: 05/22/2020 Medical Rec #:  [id number]        Height:       68.0 in Accession #:    [id number]       Weight:       171.0 lb Date of Birth:  1946/02/06        BSA:          1.912 m Patient Age:    64 years         BP:           116/77 mmHg Patient Gender: M                HR:           80 bpm. Exam Location:  Outpatient Procedure: 2D Echo, Cardiac Doppler and Color Doppler Indications:    Chemotherapy evaluation v87.41 / v58.11  History:        Patient has no prior history of Echocardiogram examinations. TIA                 and Stroke; Risk Factors:Hypertension, Dyslipidemia and GERD.  Sonographer:    Bernadene Person RDCS Referring Phys: [id number] [place]  1. Left ventricular ejection fraction, by estimation, is 60 to 65%. The left ventricle has normal function.  The left ventricle has no regional wall motion abnormalities. Left ventricular diastolic parameters are consistent with Grade I diastolic dysfunction (impaired relaxation). The average left ventricular global longitudinal strain is -17.6 %. The global longitudinal strain is normal.  2. Right ventricular systolic function is normal. The right ventricular size is normal.  3. The mitral valve is normal in structure. No evidence of mitral valve regurgitation. No evidence of mitral stenosis.  4. The aortic valve is normal in structure. Aortic valve regurgitation is not visualized. No aortic stenosis is present.  5. The inferior vena cava is normal in size with greater than 50% respiratory variability, suggesting right atrial pressure of 3 mmHg. FINDINGS  Left Ventricle: Left ventricular ejection fraction, by estimation, is 60 to 65%. The left ventricle has normal function. The left ventricle has no regional wall motion abnormalities. The average left ventricular global longitudinal strain is -17.6 %. The global longitudinal strain is normal. The left ventricular internal cavity size was normal in size. There is no left ventricular  hypertrophy. Left ventricular diastolic parameters are consistent with Grade I diastolic dysfunction (impaired relaxation). Right Ventricle: The right ventricular size is normal. No increase in right ventricular wall thickness. Right ventricular systolic function is normal. Left Atrium: Left atrial size was normal in size. Right Atrium: Right atrial size was normal in size. Pericardium: There is no evidence of pericardial effusion. Mitral Valve: The mitral valve is normal in structure. Normal mobility of the mitral valve leaflets. No evidence of mitral valve regurgitation. No evidence of mitral valve stenosis. Tricuspid Valve: The tricuspid valve is normal in structure. Tricuspid valve regurgitation is not demonstrated. No evidence of tricuspid stenosis. Aortic Valve: The aortic valve is normal in structure. Aortic valve regurgitation is not visualized. No aortic stenosis is present. Pulmonic Valve: The pulmonic valve was normal in structure. Pulmonic valve regurgitation is trivial. No evidence of pulmonic stenosis. Aorta: The aortic root is normal in size and structure. Venous: The inferior vena cava is normal in size with greater than 50% respiratory variability, suggesting right atrial pressure of 3 mmHg. IAS/Shunts: No atrial level shunt detected by color flow Doppler.  LEFT VENTRICLE PLAX 2D LVIDd:         5.10 cm  Diastology LVIDs:         3.20 cm  LV e' lateral:   9.94 cm/s LV PW:         0.70 cm  LV E/e' lateral: 4.6 LV IVS:        0.70 cm  LV e' medial:    6.85 cm/s LVOT diam:     2.10 cm  LV E/e' medial:  6.6 LV SV:         81 LV SV Index:   43       2D Longitudinal Strain LVOT Area:     3.46 cm 2D Strain GLS Avg:     -17.6 %  RIGHT VENTRICLE RV S prime:     9.79 cm/s TAPSE (M-mode): 1.6 cm LEFT ATRIUM             Index       RIGHT ATRIUM           Index LA diam:        3.70 cm 1.94 cm/m  RA Area:     14.40 cm LA Vol (A2C):   69.5 ml 36.35 ml/m RA Volume:   31.90 ml  16.68 ml/m LA Vol (A4C):   51.0 ml  26.67 ml/m LA Biplane  Vol: 59.4 ml 31.07 ml/m  AORTIC VALVE LVOT Vmax:   143.00 cm/s LVOT Vmean:  98.900 cm/s LVOT VTI:    0.235 m  AORTA Ao Root diam: 3.20 cm Ao Asc diam:  3.40 cm MITRAL VALVE MV Area (PHT): 2.22 cm    SHUNTS MV Decel Time: 342 msec    Systemic VTI:  0.24 m MV E velocity: 45.30 cm/s  Systemic Diam: 2.10 cm MV A velocity: 72.50 cm/s MV E/A ratio:  0.62 Donato Schultz MD Electronically signed by Donato Schultz MD Signature Date/Time: 05/22/2020/12:55:40 PM    Final    IR IMAGING GUIDED PORT INSERTION  Result Date: 05/19/2020 INDICATION: History of diffuse large B-cell lymphoma. In need of durable intravenous access for chemotherapy administration EXAM: IMPLANTED PORT A CATH PLACEMENT WITH ULTRASOUND AND FLUOROSCOPIC GUIDANCE COMPARISON:  PET-CT-04/28/2020 MEDICATIONS: Ancef 2 gm IV; The antibiotic was administered within an appropriate time interval prior to skin puncture. ANESTHESIA/SEDATION: Moderate (conscious) sedation was employed during this procedure. A total of Versed 2 mg and Fentanyl 100 mcg was administered intravenously. Moderate Sedation Time: 25 minutes. The patient's level of consciousness and vital signs were monitored continuously by radiology nursing throughout the procedure under my direct supervision. CONTRAST:  None FLUOROSCOPY TIME:  24 seconds (5 mGy) COMPLICATIONS: None immediate. PROCEDURE: The procedure, risks, benefits, and alternatives were explained to the patient. Questions regarding the procedure were encouraged and answered. The patient understands and consents to the procedure. The right neck and chest were prepped with chlorhexidine in a sterile fashion, and a sterile drape was applied covering the operative field. Maximum barrier sterile technique with sterile gowns and gloves were used for the procedure. A timeout was performed prior to the initiation of the procedure. Local anesthesia was provided with 1% lidocaine with epinephrine. After creating a small venotomy  incision, a micropuncture kit was utilized to access the internal jugular vein. Real-time ultrasound guidance was utilized for vascular access including the acquisition of a permanent ultrasound image documenting patency of the accessed vessel. The microwire was utilized to measure appropriate catheter length. A subcutaneous port pocket was then created along the upper chest wall utilizing a combination of sharp and blunt dissection. The pocket was irrigated with sterile saline. A single lumen "Slim" sized power injectable port was chosen for placement. The 8 Fr catheter was tunneled from the port pocket site to the venotomy incision. The port was placed in the pocket. The external catheter was trimmed to appropriate length. At the venotomy, an 8 Fr peel-away sheath was placed over a guidewire under fluoroscopic guidance. The catheter was then placed through the sheath and the sheath was removed. Final catheter positioning was confirmed and documented with a fluoroscopic spot radiograph. The port was accessed with a Huber needle, aspirated and flushed with heparinized saline. The venotomy site was closed with an interrupted 4-0 Vicryl suture. The port pocket incision was closed with interrupted 2-0 Vicryl suture. The skin was opposed with a running subcuticular 4-0 Vicryl suture. Dermabond and Steri-strips were applied to both incisions. Dressings were applied. The patient tolerated the procedure well without immediate post procedural complication. FINDINGS: After catheter placement, the tip lies within the superior cavoatrial junction. The catheter aspirates and flushes normally and is ready for immediate use. IMPRESSION: Successful placement of a right internal jugular approach power injectable Port-A-Cath. The catheter is ready for immediate use. Electronically Signed   By: Simonne Come M.D.   On: 05/19/2020 15:59    ASSESSMENT & PLAN Connor Adams  74 y.o. male with medical history significant for Stage I  DLBCL who presents for a follow up visit.  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  At this time I think he continues to be an excellent candidate for chemotherapy treatment.   On exam today Mr. Kiner appears to be tolerating treatment well and has had no major side effects as a result of his first cycle of R-EPOCH.  He is willing and able to proceed with treatment at this time and we are currently planning to proceed with cycle 2 on 06/16/2020.  The current plan is for 6 cycles of R-EPOCH chemotherapy to be administered in the inpatient setting. He is currently s/p Cycle 1 with a tentative start date of Cycle 2 on 06/16/2020.   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 --findings consistent with a double HIT lymphoma. Treatment of choice is R-EPOCH chemotherapy in the inpatient setting. --he is currently s/p Cycle 1 administered on 05/27/2020 --patient has a port in place. TTE was performed and showed strong baseline heart function. --plan for Cycle 2 of R-EPOCH to start on 06/16/2020. Direct admission to the hospital to be planned for that time.   #Symptom Management --continue allopurinol 300mg  PO daily  --prescribed ondansetron 8mg  PO q8H PRN and compazine 10mg  PO q6H prn for nausea --continue Protonix in the setting of high dose steroids --encourage daily BM, prescribed senna docusate and miralax.   No orders of the defined types were placed in this encounter.   All questions were answered. The patient knows to call the clinic with any problems, questions or concerns.  A total of more than 30 minutes were spent on this encounter and over half of that time was spent on counseling and  coordination of care as outlined above.   [name], MD Department of Hematology/Oncology [place] [facility] Phone: [phone] Pager: [phone] Email: [email or web address]  06/04/2020 1:09 PM

## 2020-06-04 ENCOUNTER — Encounter: Payer: Self-pay | Admitting: Hematology and Oncology

## 2020-06-04 ENCOUNTER — Inpatient Hospital Stay: Payer: Medicare PPO | Admitting: Hematology and Oncology

## 2020-06-04 ENCOUNTER — Inpatient Hospital Stay: Payer: Medicare PPO

## 2020-06-04 ENCOUNTER — Other Ambulatory Visit: Payer: Self-pay

## 2020-06-04 VITALS — BP 117/69 | HR 78 | Temp 98.6°F | Resp 18 | Ht 68.0 in | Wt 161.3 lb

## 2020-06-04 DIAGNOSIS — C8333 Diffuse large B-cell lymphoma, intra-abdominal lymph nodes: Secondary | ICD-10-CM

## 2020-06-04 DIAGNOSIS — R918 Other nonspecific abnormal finding of lung field: Secondary | ICD-10-CM | POA: Diagnosis not present

## 2020-06-04 DIAGNOSIS — C8338 Diffuse large B-cell lymphoma, lymph nodes of multiple sites: Secondary | ICD-10-CM | POA: Diagnosis not present

## 2020-06-04 DIAGNOSIS — R1901 Right upper quadrant abdominal swelling, mass and lump: Secondary | ICD-10-CM

## 2020-06-04 DIAGNOSIS — Z5111 Encounter for antineoplastic chemotherapy: Secondary | ICD-10-CM | POA: Diagnosis not present

## 2020-06-04 DIAGNOSIS — Z79899 Other long term (current) drug therapy: Secondary | ICD-10-CM | POA: Diagnosis not present

## 2020-06-04 LAB — CBC WITH DIFFERENTIAL (CANCER CENTER ONLY)
Abs Immature Granulocytes: 0 10*3/uL (ref 0.00–0.07)
Basophils Absolute: 0 10*3/uL (ref 0.0–0.1)
Basophils Relative: 0 %
Eosinophils Absolute: 0.1 10*3/uL (ref 0.0–0.5)
Eosinophils Relative: 1 %
HCT: 32.2 % — ABNORMAL LOW (ref 39.0–52.0)
Hemoglobin: 10.5 g/dL — ABNORMAL LOW (ref 13.0–17.0)
Lymphocytes Relative: 0 %
Lymphs Abs: 0 10*3/uL — ABNORMAL LOW (ref 0.7–4.0)
MCH: 28.7 pg (ref 26.0–34.0)
MCHC: 32.6 g/dL (ref 30.0–36.0)
MCV: 88 fL (ref 80.0–100.0)
Monocytes Absolute: 0.4 10*3/uL (ref 0.1–1.0)
Monocytes Relative: 3 %
Neutro Abs: 13.3 10*3/uL — ABNORMAL HIGH (ref 1.7–7.7)
Neutrophils Relative %: 96 %
Platelet Count: 208 10*3/uL (ref 150–400)
RBC: 3.66 MIL/uL — ABNORMAL LOW (ref 4.22–5.81)
RDW: 13.9 % (ref 11.5–15.5)
WBC Count: 13.9 10*3/uL — ABNORMAL HIGH (ref 4.0–10.5)
nRBC: 0 % (ref 0.0–0.2)

## 2020-06-04 LAB — CMP (CANCER CENTER ONLY)
ALT: 23 U/L (ref 0–44)
AST: 35 U/L (ref 15–41)
Albumin: 2.7 g/dL — ABNORMAL LOW (ref 3.5–5.0)
Alkaline Phosphatase: 64 U/L (ref 38–126)
Anion gap: 8 (ref 5–15)
BUN: 17 mg/dL (ref 8–23)
CO2: 27 mmol/L (ref 22–32)
Calcium: 8.2 mg/dL — ABNORMAL LOW (ref 8.9–10.3)
Chloride: 105 mmol/L (ref 98–111)
Creatinine: 1.03 mg/dL (ref 0.61–1.24)
GFR, Est AFR Am: >60 mL/min (ref >60)
GFR, Estimated: >60 mL/min (ref >60)
Glucose, Bld: 143 mg/dL — ABNORMAL HIGH (ref 70–99)
Potassium: 3.5 mmol/L (ref 3.5–5.1)
Sodium: 140 mmol/L (ref 135–145)
Total Bilirubin: 0.5 mg/dL (ref 0.3–1.2)
Total Protein: 5.4 g/dL — ABNORMAL LOW (ref 6.5–8.1)

## 2020-06-05 ENCOUNTER — Telehealth: Payer: Self-pay | Admitting: Hematology and Oncology

## 2020-06-05 ENCOUNTER — Encounter: Payer: Self-pay | Admitting: Gastroenterology

## 2020-06-05 ENCOUNTER — Other Ambulatory Visit: Payer: Self-pay | Admitting: Hematology and Oncology

## 2020-06-05 NOTE — Telephone Encounter (Signed)
Scheduled per los. Called and spoke with patient. Confirmed appt 

## 2020-06-13 ENCOUNTER — Telehealth: Payer: Self-pay | Admitting: *Deleted

## 2020-06-13 NOTE — Progress Notes (Signed)
Rapid Infusion Rituximab Pharmacist Evaluation  Connor Adams is a 74 y.o. male being treated with rituximab for DLBCL. This patient may be considered for RIR.   A pharmacist has verified the patient tolerated rituximab infusions per the [facility] standard infusion protocol without grade 3-4 infusion reactions. The treatment plan will be updated to reflect RIR if the patient qualifies per the checklist below:   Age > 54 years old Yes   Clinically significant cardiovascular disease No   Circulating lymphocyte count < 5000/uL prior to cycle two Yes  Lab Results  Component Value Date   LYMPHSABS 0.0 (L) 06/04/2020    Prior documented grade 3-4 infusion reaction to rituximab No   Prior documented grade 1-2 infusion reaction to rituximab (If YES, Pharmacist will confirm with Physician if patient is still a candidate for RIR) No   Previous rituximab infusion within the past 6 months Yes   Treatment Plan updated orders to reflect RIR Yes    Connor Adams does meet the criteria for Rapid Infusion Rituximab. This patient is going to be switched to rapid infusion rituximab.   I have contacted Dr. Lorenso Courier by inbasket message to see if he'd like to add premeds before rapid Tru[place]a.  Pending response.  Will f/u & address as appropriate.  Kennith Center P 06/13/20 11:23 AM

## 2020-06-13 NOTE — Progress Notes (Signed)
Per Dr. Lorenso Courier, [place] for rapid infusion. No need for pre-medication if tolerated well during last cycle.   Kennith Center, Pharm.D., CPP 06/13/2020@2 :22 PM

## 2020-06-13 NOTE — Telephone Encounter (Signed)
TCT patient's wife with information regarding upcoming in patient admission for chemotherapy-R-EPOCH. Advised that his pre-admission covid test is scheduled for 06/14/20 @ 9:10 am @ [place].  Advised that patient placement will call them Monday morning, 06/16/20 with the time for him to arrive at [facility] for admission.  Email sent notifying in patient team of his admission.

## 2020-06-14 ENCOUNTER — Other Ambulatory Visit (HOSPITAL_COMMUNITY)
Admission: RE | Admit: 2020-06-14 | Discharge: 2020-06-14 | Disposition: A | Discharge status: Home / Self Care | Payer: Medicare PPO | Source: Ambulatory Visit | Attending: Hematology and Oncology | Admitting: Hematology and Oncology

## 2020-06-14 DIAGNOSIS — Z79899 Other long term (current) drug therapy: Secondary | ICD-10-CM | POA: Diagnosis not present

## 2020-06-14 DIAGNOSIS — I129 Hypertensive chronic kidney disease with stage 1 through stage 4 chronic kidney disease, or unspecified chronic kidney disease: Secondary | ICD-10-CM | POA: Diagnosis present

## 2020-06-14 DIAGNOSIS — Z8616 Personal history of COVID-19: Secondary | ICD-10-CM | POA: Diagnosis not present

## 2020-06-14 DIAGNOSIS — Z20822 Contact with and (suspected) exposure to covid-19: Secondary | ICD-10-CM | POA: Diagnosis present

## 2020-06-14 DIAGNOSIS — M5430 Sciatica, unspecified side: Secondary | ICD-10-CM | POA: Diagnosis present

## 2020-06-14 DIAGNOSIS — T380X5A Adverse effect of glucocorticoids and synthetic analogues, initial encounter: Secondary | ICD-10-CM | POA: Diagnosis present

## 2020-06-14 DIAGNOSIS — Z23 Encounter for immunization: Secondary | ICD-10-CM | POA: Diagnosis present

## 2020-06-14 DIAGNOSIS — C8330 Diffuse large B-cell lymphoma, unspecified site: Secondary | ICD-10-CM | POA: Diagnosis present

## 2020-06-14 DIAGNOSIS — Z8673 Personal history of transient ischemic attack (TIA), and cerebral infarction without residual deficits: Secondary | ICD-10-CM | POA: Diagnosis not present

## 2020-06-14 DIAGNOSIS — R918 Other nonspecific abnormal finding of lung field: Secondary | ICD-10-CM | POA: Diagnosis not present

## 2020-06-14 DIAGNOSIS — N183 Chronic kidney disease, stage 3 unspecified: Secondary | ICD-10-CM | POA: Diagnosis present

## 2020-06-14 DIAGNOSIS — D649 Anemia, unspecified: Secondary | ICD-10-CM | POA: Diagnosis present

## 2020-06-14 DIAGNOSIS — K219 Gastro-esophageal reflux disease without esophagitis: Secondary | ICD-10-CM | POA: Diagnosis present

## 2020-06-14 DIAGNOSIS — E785 Hyperlipidemia, unspecified: Secondary | ICD-10-CM | POA: Diagnosis present

## 2020-06-14 DIAGNOSIS — Z86718 Personal history of other venous thrombosis and embolism: Secondary | ICD-10-CM | POA: Diagnosis not present

## 2020-06-14 DIAGNOSIS — D72829 Elevated white blood cell count, unspecified: Secondary | ICD-10-CM | POA: Diagnosis present

## 2020-06-14 DIAGNOSIS — Z96652 Presence of left artificial knee joint: Secondary | ICD-10-CM | POA: Diagnosis present

## 2020-06-14 DIAGNOSIS — C8338 Diffuse large B-cell lymphoma, lymph nodes of multiple sites: Secondary | ICD-10-CM | POA: Diagnosis not present

## 2020-06-14 DIAGNOSIS — Z7901 Long term (current) use of anticoagulants: Secondary | ICD-10-CM | POA: Diagnosis not present

## 2020-06-14 DIAGNOSIS — Z85820 Personal history of malignant melanoma of skin: Secondary | ICD-10-CM | POA: Diagnosis not present

## 2020-06-14 DIAGNOSIS — Z86006 Personal history of melanoma in-situ: Secondary | ICD-10-CM | POA: Diagnosis not present

## 2020-06-14 DIAGNOSIS — Z01812 Encounter for preprocedural laboratory examination: Secondary | ICD-10-CM | POA: Insufficient documentation

## 2020-06-14 DIAGNOSIS — K5900 Constipation, unspecified: Secondary | ICD-10-CM | POA: Diagnosis present

## 2020-06-14 DIAGNOSIS — Z5111 Encounter for antineoplastic chemotherapy: Secondary | ICD-10-CM | POA: Diagnosis present

## 2020-06-14 LAB — SARS CORONAVIRUS 2 (TAT 6-24 HRS): SARS Coronavirus 2: NEGATIVE

## 2020-06-16 ENCOUNTER — Other Ambulatory Visit: Payer: Self-pay | Admitting: Hematology and Oncology

## 2020-06-16 ENCOUNTER — Inpatient Hospital Stay (HOSPITAL_COMMUNITY)
Admission: AD | Admit: 2020-06-16 | Discharge: 2020-06-20 | DRG: 847 | Disposition: A | Discharge status: Home / Self Care | Payer: Medicare PPO | Source: Ambulatory Visit | Attending: Hematology and Oncology | Admitting: Hematology and Oncology

## 2020-06-16 ENCOUNTER — Encounter (HOSPITAL_COMMUNITY): Payer: Self-pay | Admitting: Hematology and Oncology

## 2020-06-16 ENCOUNTER — Other Ambulatory Visit: Payer: Self-pay

## 2020-06-16 DIAGNOSIS — Z8673 Personal history of transient ischemic attack (TIA), and cerebral infarction without residual deficits: Secondary | ICD-10-CM

## 2020-06-16 DIAGNOSIS — R918 Other nonspecific abnormal finding of lung field: Secondary | ICD-10-CM

## 2020-06-16 DIAGNOSIS — Z86006 Personal history of melanoma in-situ: Secondary | ICD-10-CM

## 2020-06-16 DIAGNOSIS — Z8616 Personal history of COVID-19: Secondary | ICD-10-CM

## 2020-06-16 DIAGNOSIS — Z86718 Personal history of other venous thrombosis and embolism: Secondary | ICD-10-CM | POA: Diagnosis not present

## 2020-06-16 DIAGNOSIS — T380X5A Adverse effect of glucocorticoids and synthetic analogues, initial encounter: Secondary | ICD-10-CM | POA: Diagnosis present

## 2020-06-16 DIAGNOSIS — Z79899 Other long term (current) drug therapy: Secondary | ICD-10-CM

## 2020-06-16 DIAGNOSIS — M5430 Sciatica, unspecified side: Secondary | ICD-10-CM | POA: Diagnosis present

## 2020-06-16 DIAGNOSIS — E785 Hyperlipidemia, unspecified: Secondary | ICD-10-CM | POA: Diagnosis present

## 2020-06-16 DIAGNOSIS — I129 Hypertensive chronic kidney disease with stage 1 through stage 4 chronic kidney disease, or unspecified chronic kidney disease: Secondary | ICD-10-CM | POA: Diagnosis present

## 2020-06-16 DIAGNOSIS — C8330 Diffuse large B-cell lymphoma, unspecified site: Secondary | ICD-10-CM | POA: Diagnosis present

## 2020-06-16 DIAGNOSIS — K5900 Constipation, unspecified: Secondary | ICD-10-CM | POA: Diagnosis present

## 2020-06-16 DIAGNOSIS — C8338 Diffuse large B-cell lymphoma, lymph nodes of multiple sites: Secondary | ICD-10-CM

## 2020-06-16 DIAGNOSIS — Z5111 Encounter for antineoplastic chemotherapy: Secondary | ICD-10-CM | POA: Diagnosis present

## 2020-06-16 DIAGNOSIS — K219 Gastro-esophageal reflux disease without esophagitis: Secondary | ICD-10-CM | POA: Diagnosis present

## 2020-06-16 DIAGNOSIS — Z7901 Long term (current) use of anticoagulants: Secondary | ICD-10-CM

## 2020-06-16 DIAGNOSIS — D649 Anemia, unspecified: Secondary | ICD-10-CM | POA: Diagnosis present

## 2020-06-16 DIAGNOSIS — D72829 Elevated white blood cell count, unspecified: Secondary | ICD-10-CM | POA: Diagnosis present

## 2020-06-16 DIAGNOSIS — Z23 Encounter for immunization: Secondary | ICD-10-CM

## 2020-06-16 DIAGNOSIS — N183 Chronic kidney disease, stage 3 unspecified: Secondary | ICD-10-CM | POA: Diagnosis present

## 2020-06-16 DIAGNOSIS — Z96652 Presence of left artificial knee joint: Secondary | ICD-10-CM | POA: Diagnosis present

## 2020-06-16 DIAGNOSIS — Z20822 Contact with and (suspected) exposure to covid-19: Secondary | ICD-10-CM | POA: Diagnosis present

## 2020-06-16 DIAGNOSIS — Z85820 Personal history of malignant melanoma of skin: Secondary | ICD-10-CM | POA: Diagnosis not present

## 2020-06-16 DIAGNOSIS — C8333 Diffuse large B-cell lymphoma, intra-abdominal lymph nodes: Secondary | ICD-10-CM

## 2020-06-16 LAB — COMPREHENSIVE METABOLIC PANEL
ALT: 55 U/L — ABNORMAL HIGH (ref 0–44)
AST: 28 U/L (ref 15–41)
Albumin: 3.2 g/dL — ABNORMAL LOW (ref 3.5–5.0)
Alkaline Phosphatase: 66 U/L (ref 38–126)
Anion gap: 7 (ref 5–15)
BUN: 20 mg/dL (ref 8–23)
CO2: 26 mmol/L (ref 22–32)
Calcium: 8.6 mg/dL — ABNORMAL LOW (ref 8.9–10.3)
Chloride: 106 mmol/L (ref 98–111)
Creatinine, Ser: 1.09 mg/dL (ref 0.61–1.24)
GFR calc Af Amer: >60 mL/min (ref >60)
GFR calc non Af Amer: >60 mL/min (ref >60)
Glucose, Bld: 105 mg/dL — ABNORMAL HIGH (ref 70–99)
Potassium: 4.2 mmol/L (ref 3.5–5.1)
Sodium: 139 mmol/L (ref 135–145)
Total Bilirubin: 0.1 mg/dL — ABNORMAL LOW (ref 0.3–1.2)
Total Protein: 6 g/dL — ABNORMAL LOW (ref 6.5–8.1)

## 2020-06-16 LAB — CBC WITH DIFFERENTIAL/PLATELET
Abs Immature Granulocytes: 0.25 10*3/uL — ABNORMAL HIGH (ref 0.00–0.07)
Basophils Absolute: 0.1 10*3/uL (ref 0.0–0.1)
Basophils Relative: 1 %
Eosinophils Absolute: 0.2 10*3/uL (ref 0.0–0.5)
Eosinophils Relative: 2 %
HCT: 31 % — ABNORMAL LOW (ref 39.0–52.0)
Hemoglobin: 10.1 g/dL — ABNORMAL LOW (ref 13.0–17.0)
Immature Granulocytes: 3 %
Lymphocytes Relative: 6 %
Lymphs Abs: 0.6 10*3/uL — ABNORMAL LOW (ref 0.7–4.0)
MCH: 29.4 pg (ref 26.0–34.0)
MCHC: 32.6 g/dL (ref 30.0–36.0)
MCV: 90.4 fL (ref 80.0–100.0)
Monocytes Absolute: 0.9 10*3/uL (ref 0.1–1.0)
Monocytes Relative: 9 %
Neutro Abs: 8.1 10*3/uL — ABNORMAL HIGH (ref 1.7–7.7)
Neutrophils Relative %: 79 %
Platelets: 312 10*3/uL (ref 150–400)
RBC: 3.43 MIL/uL — ABNORMAL LOW (ref 4.22–5.81)
RDW: 16.2 % — ABNORMAL HIGH (ref 11.5–15.5)
WBC: 10.2 10*3/uL (ref 4.0–10.5)
nRBC: 0 % (ref 0.0–0.2)

## 2020-06-16 LAB — URIC ACID: Uric Acid, Serum: 3.6 mg/dL — ABNORMAL LOW (ref 3.7–8.6)

## 2020-06-16 LAB — LACTATE DEHYDROGENASE: LDH: 155 U/L (ref 98–192)

## 2020-06-16 MED ORDER — PANTOPRAZOLE SODIUM 40 MG PO TBEC
40.0000 mg | DELAYED_RELEASE_TABLET | Freq: Two times a day (BID) | ORAL | Status: DC
  Administered 2020-06-16 – 2020-06-20 (×8): 40 mg via ORAL
  Filled 2020-06-16 (×7): qty 1

## 2020-06-16 MED ORDER — ALLOPURINOL 300 MG PO TABS
300.0000 mg | ORAL_TABLET | Freq: Every day | ORAL | Status: DC
  Administered 2020-06-17 – 2020-06-20 (×4): 300 mg via ORAL
  Filled 2020-06-16: qty 3
  Filled 2020-06-16 (×3): qty 1

## 2020-06-16 MED ORDER — SUCRALFATE 1 G PO TABS
1.0000 g | ORAL_TABLET | Freq: Three times a day (TID) | ORAL | Status: DC
  Administered 2020-06-16 – 2020-06-20 (×16): 1 g via ORAL
  Filled 2020-06-16 (×16): qty 1

## 2020-06-16 MED ORDER — ENSURE ENLIVE PO LIQD
237.0000 mL | Freq: Two times a day (BID) | ORAL | Status: DC
  Administered 2020-06-16 – 2020-06-20 (×8): 237 mL via ORAL

## 2020-06-16 MED ORDER — ONDANSETRON 4 MG PO TBDP: 8.0000 mg | ORAL_TABLET | Freq: Three times a day (TID) | ORAL | Status: DC | PRN

## 2020-06-16 MED ORDER — SODIUM BICARBONATE/SODIUM CHLORIDE MOUTHWASH
1.0000 "application " | OROMUCOSAL | Status: DC | PRN
  Filled 2020-06-16 (×2): qty 1000

## 2020-06-16 MED ORDER — PREDNISONE 50 MG PO TABS
120.0000 mg | ORAL_TABLET | Freq: Every day | ORAL | Status: AC
  Administered 2020-06-16 – 2020-06-20 (×5): 120 mg via ORAL
  Filled 2020-06-16: qty 4
  Filled 2020-06-16: qty 2
  Filled 2020-06-16: qty 4
  Filled 2020-06-16 (×4): qty 2
  Filled 2020-06-16 (×2): qty 4

## 2020-06-16 MED ORDER — SODIUM CHLORIDE 0.9 % IV SOLN
Freq: Once | INTRAVENOUS | Status: AC
  Administered 2020-06-16: 18 mg via INTRAVENOUS
  Filled 2020-06-16: qty 4

## 2020-06-16 MED ORDER — SODIUM CHLORIDE 0.9% FLUSH: 10.0000 mL | INTRAVENOUS | Status: DC | PRN

## 2020-06-16 MED ORDER — SENNOSIDES-DOCUSATE SODIUM 8.6-50 MG PO TABS: 2.0000 | ORAL_TABLET | Freq: Two times a day (BID) | ORAL | Status: DC | PRN

## 2020-06-16 MED ORDER — SODIUM CHLORIDE 0.9% FLUSH
10.0000 mL | Freq: Two times a day (BID) | INTRAVENOUS | Status: DC
  Administered 2020-06-16 (×2): 20 mL
  Administered 2020-06-17 – 2020-06-20 (×7): 10 mL

## 2020-06-16 MED ORDER — VINCRISTINE SULFATE CHEMO INJECTION 1 MG/ML
Freq: Once | INTRAVENOUS | Status: AC
  Filled 2020-06-16: qty 10

## 2020-06-16 MED ORDER — ENOXAPARIN SODIUM 40 MG/0.4ML ~~LOC~~ SOLN
40.0000 mg | SUBCUTANEOUS | Status: DC
  Administered 2020-06-16 – 2020-06-20 (×5): 40 mg via SUBCUTANEOUS
  Filled 2020-06-16 (×4): qty 0.4

## 2020-06-16 MED ORDER — LOSARTAN POTASSIUM 50 MG PO TABS
25.0000 mg | ORAL_TABLET | Freq: Every day | ORAL | Status: DC
  Administered 2020-06-17 – 2020-06-20 (×4): 25 mg via ORAL
  Filled 2020-06-16 (×4): qty 1

## 2020-06-16 MED ORDER — HYDROCORTISONE 2.5 % RE CREA
1.0000 "application " | TOPICAL_CREAM | Freq: Every day | RECTAL | Status: DC | PRN
  Filled 2020-06-16: qty 28.35

## 2020-06-16 MED ORDER — SENNOSIDES-DOCUSATE SODIUM 8.6-50 MG PO TABS
2.0000 | ORAL_TABLET | Freq: Every day | ORAL | Status: DC
  Administered 2020-06-16: 2 via ORAL
  Filled 2020-06-16: qty 2

## 2020-06-16 MED ORDER — ADULT MULTIVITAMIN W/MINERALS CH
1.0000 | ORAL_TABLET | Freq: Every day | ORAL | Status: DC
  Administered 2020-06-17 – 2020-06-20 (×4): 1 via ORAL
  Filled 2020-06-16 (×4): qty 1

## 2020-06-16 MED ORDER — CHLORHEXIDINE GLUCONATE CLOTH 2 % EX PADS
6.0000 | MEDICATED_PAD | Freq: Every day | CUTANEOUS | Status: DC
  Administered 2020-06-17 – 2020-06-20 (×4): 6 via TOPICAL

## 2020-06-16 MED ORDER — PROCHLORPERAZINE MALEATE 10 MG PO TABS: 10.0000 mg | ORAL_TABLET | Freq: Four times a day (QID) | ORAL | Status: DC | PRN

## 2020-06-16 MED ORDER — ROSUVASTATIN CALCIUM 5 MG PO TABS
5.0000 mg | ORAL_TABLET | Freq: Every day | ORAL | Status: DC
  Administered 2020-06-16 – 2020-06-20 (×5): 5 mg via ORAL
  Filled 2020-06-16 (×5): qty 1

## 2020-06-16 MED ORDER — LIDOCAINE-PRILOCAINE 2.5-2.5 % EX CREA
TOPICAL_CREAM | Freq: Once | CUTANEOUS | Status: AC
  Filled 2020-06-16: qty 5

## 2020-06-16 MED ORDER — SODIUM CHLORIDE 0.9 % IV SOLN: INTRAVENOUS | Status: DC

## 2020-06-16 MED ORDER — POLYETHYLENE GLYCOL 3350 17 G PO PACK
17.0000 g | PACK | Freq: Every day | ORAL | Status: DC | PRN
  Administered 2020-06-17 – 2020-06-19 (×3): 17 g via ORAL
  Filled 2020-06-16 (×3): qty 1

## 2020-06-16 NOTE — Progress Notes (Signed)
Patient due to begin chemotherapy today.  Chemo dosages, total VTBI, expiration date/time, 2 pharmacy signatures, and patient identifiers independently verified by me and by Aldean Baker, RN.  Zandra Abts [facility]  06/16/2020

## 2020-06-16 NOTE — H&P (Signed)
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   1) 04/17/2020: CT A/P showed dominant nodal mass(11.5 x 10.9 cm)within the jejunal mesentery, highly suspicious for lymphoma. Additionally there are left lower lobe pleural-based pulmonary nodules 2) 04/24/2020: establish care with Dr. Lorenso Courier 3) 04/28/2020:  PET CT scan shows Large solid left mesenteric mass 13.0 cm with maximum SUV of 21.2, Deauville 5 with local hypermetabolic porta hepatis and retroperitoneal lymph nodes. Constitutes bulky Stage I disease.   Reason for Admission: double hit lymphoma, Cycle #2 EPOCH-R  HPI: Connor Adams 74 y.o. 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  On review of the previous records Mr. Say underwent a CT abdomen pelvis on 04/17/2020 at which time a nodal mass 11.5 x 10.9 cm was noted within the jejunal mesentery.  There were also left lower lobe pleural-based pulmonary nodules noted at that time as well as soft tissue fullness at the ileocecal valve.  Due to concern for this finding the patient was referred to oncology for further evaluation and management.  A PET scan was performed on 04/28/2020 showed a large solid left mesenteric mass 13.0 cm with maximum SUV of 21.2, Deauville 5, hypermetabolic porta hepatis and retroperitoneal lymph nodes along with small but hypermetabolic juxtadiaphragmatic/pericardial lymph nodes in the lower chest, generally Deauville 4 and Deauville 5. A biopsy of the mesenteric mass was performed on 05/08/2020 which was consistent with Large B-cell lymphoma. 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 It was recommend for him to begin chemotherapy with EPOCH-R.   In anticipation of chemotherapy, the patient had a PAC placed on 05/19/2020 and an echocardiogram completed on 05/22/2020 which showed an LVEF of 60-65%.  The patient is seen admission today.  He reports that he has been feeling well overall.  He has more energy and has been able to do some yard work.  He reports that his appetite is improving.  He no longer has any abdominal fullness, nausea, or vomiting.  He reports that his bowels are moving well.  He denies fevers and chills.  Denies chest pain, shortness of breath, cough.  Denies headaches and dizziness.  Denies bleeding. The patient is seen today for admission to the hospital for cycle #2 EPOCH-R.     Past Medical History:  Diagnosis Date  . Arthritis   . Chronic kidney disease (CKD), stage III (moderate)   . COVID-19   . GERD (gastroesophageal reflux disease)    occ  . Hemorrhoids   . History of ETT 3/05   low risk  . History of hiatal hernia   . History of kidney stones   . HLD (hyperlipidemia)   . HTN (hypertension)   . Left inguinal hernia   . Melanoma ([place])    excied  . 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 Stroke [facility]) 2011   tia   . TIA (transient ischemic attack) 4/11   associated w slurred speecha ndmild facial droop. lasted for about 10 minutes. Had MRI, etc with [facility] that per his report was ok (dr. Stephannie Li). Echo (4/11): EF 55-60%, no regional WMAs,  normal diastolic function, mild LAE, no source of embolus  :   Past Surgical History:  Procedure Laterality Date  . BMI 30.2 muscular  12/08/04  . curretage actinic keratosis R ear  01/20/05  . ETT low prob of CAD  11/21/03  . excision melanoma in situ back  01/20/05  . hemorrhoids sclerosed at colonoscopy  12/19/05  . IR IMAGING GUIDED PORT INSERTION  05/19/2020  . LAPAROSCOPIC INGUINAL HERNIA REPAIR Left 12/20/1995  . LUMBAR DISC  SURGERY    . NERVE REPAIR     back of neck  . retinal laser surgery Left 09/20/1986  . SEPTOPLASTY  11/19/98  . TOTAL KNEE ARTHROPLASTY Left 12/13/2016   Procedure: TOTAL KNEE ARTHROPLASTY WITH RIGHT KNEE CORTISONE INJECTION;  Surgeon: Frederik Pear, MD;  Location: [facility];  Service: Orthopedics;  Laterality: Left;  . uric acid 6.9  12/22/04  . x-ray l great toe  07/21/00  :   Current Facility-Administered Medications  Medication Dose Route Frequency Provider Last Rate Last Admin  . allopurinol (ZYLOPRIM) tablet 300 mg  300 mg Oral Daily Kourtland Coopman, Roselie Awkward, NP      . Chlorhexidine Gluconate Cloth 2 % PADS 6 each  6 each Topical Daily Liticia Gasior R, NP      . enoxaparin (LOVENOX) injection 40 mg  40 mg Subcutaneous Q24H Aaronmichael Brumbaugh R, NP   40 mg at 06/16/20 1017  . hydrocortisone (ANUSOL-HC) 2.5 % rectal cream 1 application  1 application Rectal Daily PRN Maryanna Shape, NP      . Derrill Memo ON 06/17/2020] losartan (COZAAR) tablet 25 mg  25 mg Oral Daily Ramiya Delahunty, Roselie Awkward, NP      . multivitamin with minerals tablet 1 tablet  1 tablet Oral Daily Marcel Gary R, NP      . ondansetron (ZOFRAN-ODT) disintegrating tablet 8 mg  8 mg Oral Q8H PRN Teion Ballin, Roselie Awkward, NP      . pantoprazole (PROTONIX) EC tablet 40 mg  40 mg Oral BID Tacia Hindley R, NP      . polyethylene glycol (MIRALAX / GLYCOLAX) packet 17 g  17 g Oral Daily PRN Cleve Paolillo, Roselie Awkward, NP      . prochlorperazine (COMPAZINE) tablet 10 mg  10 mg Oral Q6H PRN Raissa Dam, Roselie Awkward, NP      . rosuvastatin (CRESTOR) tablet 5 mg  5 mg Oral Daily Ariyanna Oien, Roselie Awkward, NP      . senna-docusate (Senokot-S) tablet 2 tablet  2 tablet Oral BID PRN Maryanna Shape, NP      . sodium bicarbonate/sodium chloride mouthwash [id number]  1 application Mouth Rinse PRN Ronelle Michie R, NP      . sodium chloride flush (NS) 0.9 % injection 10-40 mL  10-40 mL Intracatheter Q12H Briaunna Grindstaff R, NP   20 mL at 06/16/20 1110  . sodium chloride flush (NS) 0.9 %  injection 10-40 mL  10-40 mL Intracatheter PRN Lenton Gendreau R, NP      . sucralfate (CARAFATE) tablet 1 g  1 g Oral TID WC & HS Khyran Riera, Roselie Awkward, NP         No Known Allergies:   Family History  Problem Relation Age of Onset  . Liver cancer Sister   . Heart failure Brother 44       CABGx4  . Heart attack Mother 34  . Uterine cancer Mother   . Heart attack Father 17  . Asthma Son   . Diabetes Brother   .  Prostate cancer Brother   :   Social History   Socioeconomic History  . Marital status: Married    Spouse name: [name]  . Number of children: 2  . Years of education: 10  . Highest education level: Not on file  Occupational History  . Occupation: Systems developer: [employer]  Tobacco Use  . Smoking status: Never Smoker  . Smokeless tobacco: Never Used  Vaping Use  . Vaping Use: Never used  Substance and Sexual Activity  . Alcohol use: No  . Drug use: No  . Sexual activity: Not on file  Other Topics Concern  . Not on file  Social History Narrative   Married to [employer] at [employer].   Son, [name] married   Son, [name], Married.    Caffeine use: 1 cup coffee per day   Social Determinants of Health   Financial Resource Strain:   . Difficulty of Paying Living Expenses: Not on file  Food Insecurity:   . Worried About Charity fundraiser in the Last Year: Not on file  . Ran Out of Food in the Last Year: Not on file  Transportation Needs:   . Lack of Transportation (Medical): Not on file  . Lack of Transportation (Non-Medical): Not on file  Physical Activity:   . Days of Exercise per Week: Not on file  . Minutes of Exercise per Session: Not on file  Stress:   . Feeling of Stress : Not on file  Social Connections:   . Frequency of Communication with Friends and Family: Not on file  . Frequency of Social Gatherings with Friends and Family: Not on file  . Attends Religious Services: Not on file  . Active Member of  Clubs or Organizations: Not on file  . Attends Archivist Meetings: Not on file  . Marital Status: Not on file  Intimate Partner Violence:   . Fear of Current or Ex-Partner: Not on file  . Emotionally Abused: Not on file  . Physically Abused: Not on file  . Sexually Abused: Not on file  :  Review of Systems: A comprehensive 14 point review of systems was negative except as noted in the HPI.  Exam: Patient Vitals for the past 24 hrs:  BP Temp Temp src Pulse Resp SpO2 Height Weight  06/16/20 1015 -- -- -- -- -- -- _0  (1.727 m) 71.7 kg  06/16/20 0944 107/74 97.8 F (36.6 C) Oral 76 20 97 % -- --    General:  well-nourished in no acute distress.   Eyes:  no scleral icterus.   ENT:  There were no oropharyngeal lesions.   Neck was without thyromegaly.   Lymphatics:  Negative cervical, supraclavicular or axillary adenopathy.   Respiratory: lungs were clear bilaterally without wheezing or crackles.   Cardiovascular:  Regular rate and rhythm, S1/S2, without murmur, rub or gallop.  There was no pedal edema.   GI:  Positive bowel sounds.  I was unable to palpate the mass in the left upper quadrant today.    Musculoskeletal:  no spinal tenderness of palpation of vertebral spine.   Skin exam was without echymosis, petichae.   Neuro exam was nonfocal. Patient was alert and oriented.  Attention was good.   Language was appropriate.  Mood was normal without depression.  Speech was not pressured.  Thought content was not tangential.     Lab Results  Component Value Date  WBC 13.9 (H) 06/04/2020   HGB 10.5 (L) 06/04/2020   HCT 32.2 (L) 06/04/2020   PLT 208 06/04/2020   GLUCOSE 143 (H) 06/04/2020   CHOL  12/31/2009    160        ATP III CLASSIFICATION:  <200     mg/dL   Desirable  200-239  mg/dL   Borderline High  >=240    mg/dL   High          TRIG 213 (H) 12/31/2009   HDL 28 (L) 12/31/2009   LDLCALC  12/31/2009    89        Total Cholesterol/HDL:CHD Risk Coronary  Heart Disease Risk Table                     Men   Women  1/2 Average Risk   3.4   3.3  Average Risk       5.0   4.4  2 X Average Risk   9.6   7.1  3 X Average Risk  23.4   11.0        Use the calculated Patient Ratio above and the CHD Risk Table to determine the patient's CHD Risk.        ATP III CLASSIFICATION (LDL):  <100     mg/dL   Optimal  100-129  mg/dL   Near or Above                    Optimal  130-159  mg/dL   Borderline  160-189  mg/dL   High  >190     mg/dL   Very High   ALT 23 06/04/2020   AST 35 06/04/2020   NA 140 06/04/2020   K 3.5 06/04/2020   CL 105 06/04/2020   CREATININE 1.03 06/04/2020   BUN 17 06/04/2020   CO2 27 06/04/2020    ECHOCARDIOGRAM COMPLETE  Result Date: 05/22/2020    ECHOCARDIOGRAM REPORT   Patient Name:   Connor Adams Hecker Date of Exam: 05/22/2020 Medical Rec #:  [id number]        Height:       68.0 in Accession #:    [id number]       Weight:       171.0 lb Date of Birth:  Mar 31, 1946        BSA:          1.912 m Patient Age:    49 years         BP:           116/77 mmHg Patient Gender: M                HR:           80 bpm. Exam Location:  Outpatient Procedure: 2D Echo, Cardiac Doppler and Color Doppler Indications:    Chemotherapy evaluation v87.41 / v58.11  History:        Patient has no prior history of Echocardiogram examinations. TIA                 and Stroke; Risk Factors:Hypertension, Dyslipidemia and GERD.  Sonographer:    Bernadene Person RDCS Referring Phys: [id number] [place]  1. Left ventricular ejection fraction, by estimation, is 60 to 65%. The left ventricle has normal function. The left ventricle has no regional wall motion abnormalities. Left ventricular diastolic parameters are consistent with Grade I diastolic dysfunction (impaired relaxation). The average left ventricular global longitudinal  strain is -17.6 %. The global longitudinal strain is normal.  2. Right ventricular systolic function is normal. The right  ventricular size is normal.  3. The mitral valve is normal in structure. No evidence of mitral valve regurgitation. No evidence of mitral stenosis.  4. The aortic valve is normal in structure. Aortic valve regurgitation is not visualized. No aortic stenosis is present.  5. The inferior vena cava is normal in size with greater than 50% respiratory variability, suggesting right atrial pressure of 3 mmHg. FINDINGS  Left Ventricle: Left ventricular ejection fraction, by estimation, is 60 to 65%. The left ventricle has normal function. The left ventricle has no regional wall motion abnormalities. The average left ventricular global longitudinal strain is -17.6 %. The global longitudinal strain is normal. The left ventricular internal cavity size was normal in size. There is no left ventricular hypertrophy. Left ventricular diastolic parameters are consistent with Grade I diastolic dysfunction (impaired relaxation). Right Ventricle: The right ventricular size is normal. No increase in right ventricular wall thickness. Right ventricular systolic function is normal. Left Atrium: Left atrial size was normal in size. Right Atrium: Right atrial size was normal in size. Pericardium: There is no evidence of pericardial effusion. Mitral Valve: The mitral valve is normal in structure. Normal mobility of the mitral valve leaflets. No evidence of mitral valve regurgitation. No evidence of mitral valve stenosis. Tricuspid Valve: The tricuspid valve is normal in structure. Tricuspid valve regurgitation is not demonstrated. No evidence of tricuspid stenosis. Aortic Valve: The aortic valve is normal in structure. Aortic valve regurgitation is not visualized. No aortic stenosis is present. Pulmonic Valve: The pulmonic valve was normal in structure. Pulmonic valve regurgitation is trivial. No evidence of pulmonic stenosis. Aorta: The aortic root is normal in size and structure. Venous: The inferior vena cava is normal in size with greater  than 50% respiratory variability, suggesting right atrial pressure of 3 mmHg. IAS/Shunts: No atrial level shunt detected by color flow Doppler.  LEFT VENTRICLE PLAX 2D LVIDd:         5.10 cm  Diastology LVIDs:         3.20 cm  LV e' lateral:   9.94 cm/s LV PW:         0.70 cm  LV E/e' lateral: 4.6 LV IVS:        0.70 cm  LV e' medial:    6.85 cm/s LVOT diam:     2.10 cm  LV E/e' medial:  6.6 LV SV:         81 LV SV Index:   43       2D Longitudinal Strain LVOT Area:     3.46 cm 2D Strain GLS Avg:     -17.6 %  RIGHT VENTRICLE RV S prime:     9.79 cm/s TAPSE (M-mode): 1.6 cm LEFT ATRIUM             Index       RIGHT ATRIUM           Index LA diam:        3.70 cm 1.94 cm/m  RA Area:     14.40 cm LA Vol (A2C):   69.5 ml 36.35 ml/m RA Volume:   31.90 ml  16.68 ml/m LA Vol (A4C):   51.0 ml 26.67 ml/m LA Biplane Vol: 59.4 ml 31.07 ml/m  AORTIC VALVE LVOT Vmax:   143.00 cm/s LVOT Vmean:  98.900 cm/s LVOT VTI:    0.235 m  AORTA  Ao Root diam: 3.20 cm Ao Asc diam:  3.40 cm MITRAL VALVE MV Area (PHT): 2.22 cm    SHUNTS MV Decel Time: 342 msec    Systemic VTI:  0.24 m MV E velocity: 45.30 cm/s  Systemic Diam: 2.10 cm MV A velocity: 72.50 cm/s MV E/A ratio:  0.62 Candee Furbish MD Electronically signed by Candee Furbish MD Signature Date/Time: 05/22/2020/12:55:40 PM    Final    IR IMAGING GUIDED PORT INSERTION  Result Date: 05/19/2020 INDICATION: History of diffuse large B-cell lymphoma. In need of durable intravenous access for chemotherapy administration EXAM: IMPLANTED PORT A CATH PLACEMENT WITH ULTRASOUND AND FLUOROSCOPIC GUIDANCE COMPARISON:  PET-CT-04/28/2020 MEDICATIONS: Ancef 2 gm IV; The antibiotic was administered within an appropriate time interval prior to skin puncture. ANESTHESIA/SEDATION: Moderate (conscious) sedation was employed during this procedure. A total of Versed 2 mg and Fentanyl 100 mcg was administered intravenously. Moderate Sedation Time: 25 minutes. The patient's level of consciousness and vital  signs were monitored continuously by radiology nursing throughout the procedure under my direct supervision. CONTRAST:  None FLUOROSCOPY TIME:  24 seconds (5 mGy) COMPLICATIONS: None immediate. PROCEDURE: The procedure, risks, benefits, and alternatives were explained to the patient. Questions regarding the procedure were encouraged and answered. The patient understands and consents to the procedure. The right neck and chest were prepped with chlorhexidine in a sterile fashion, and a sterile drape was applied covering the operative field. Maximum barrier sterile technique with sterile gowns and gloves were used for the procedure. A timeout was performed prior to the initiation of the procedure. Local anesthesia was provided with 1% lidocaine with epinephrine. After creating a small venotomy incision, a micropuncture kit was utilized to access the internal jugular vein. Real-time ultrasound guidance was utilized for vascular access including the acquisition of a permanent ultrasound image documenting patency of the accessed vessel. The microwire was utilized to measure appropriate catheter length. A subcutaneous port pocket was then created along the upper chest wall utilizing a combination of sharp and blunt dissection. The pocket was irrigated with sterile saline. A single lumen "Slim" sized power injectable port was chosen for placement. The 8 Fr catheter was tunneled from the port pocket site to the venotomy incision. The port was placed in the pocket. The external catheter was trimmed to appropriate length. At the venotomy, an 8 Fr peel-away sheath was placed over a guidewire under fluoroscopic guidance. The catheter was then placed through the sheath and the sheath was removed. Final catheter positioning was confirmed and documented with a fluoroscopic spot radiograph. The port was accessed with a Huber needle, aspirated and flushed with heparinized saline. The venotomy site was closed with an interrupted 4-0  Vicryl suture. The port pocket incision was closed with interrupted 2-0 Vicryl suture. The skin was opposed with a running subcuticular 4-0 Vicryl suture. Dermabond and Steri-strips were applied to both incisions. Dressings were applied. The patient tolerated the procedure well without immediate post procedural complication. FINDINGS: After catheter placement, the tip lies within the superior cavoatrial junction. The catheter aspirates and flushes normally and is ready for immediate use. IMPRESSION: Successful placement of a right internal jugular approach power injectable Port-A-Cath. The catheter is ready for immediate use. Electronically Signed   By: Sandi Mariscal M.D.   On: 05/19/2020 15:59     ECHOCARDIOGRAM COMPLETE  Result Date: 05/22/2020    ECHOCARDIOGRAM REPORT   Patient Name:   Connor Adams [facility] Date of Exam: 05/22/2020 Medical Rec #:  [id number]  Height:       68.0 in Accession #:    [id number]       Weight:       171.0 lb Date of Birth:  August 03, 1946        BSA:          1.912 m Patient Age:    43 years         BP:           116/77 mmHg Patient Gender: M                HR:           80 bpm. Exam Location:  Outpatient Procedure: 2D Echo, Cardiac Doppler and Color Doppler Indications:    Chemotherapy evaluation v87.41 / v58.11  History:        Patient has no prior history of Echocardiogram examinations. TIA                 and Stroke; Risk Factors:Hypertension, Dyslipidemia and GERD.  Sonographer:    Bernadene Person RDCS Referring Phys: [id number] [place]  1. Left ventricular ejection fraction, by estimation, is 60 to 65%. The left ventricle has normal function. The left ventricle has no regional wall motion abnormalities. Left ventricular diastolic parameters are consistent with Grade I diastolic dysfunction (impaired relaxation). The average left ventricular global longitudinal strain is -17.6 %. The global longitudinal strain is normal.  2. Right ventricular systolic function is  normal. The right ventricular size is normal.  3. The mitral valve is normal in structure. No evidence of mitral valve regurgitation. No evidence of mitral stenosis.  4. The aortic valve is normal in structure. Aortic valve regurgitation is not visualized. No aortic stenosis is present.  5. The inferior vena cava is normal in size with greater than 50% respiratory variability, suggesting right atrial pressure of 3 mmHg. FINDINGS  Left Ventricle: Left ventricular ejection fraction, by estimation, is 60 to 65%. The left ventricle has normal function. The left ventricle has no regional wall motion abnormalities. The average left ventricular global longitudinal strain is -17.6 %. The global longitudinal strain is normal. The left ventricular internal cavity size was normal in size. There is no left ventricular hypertrophy. Left ventricular diastolic parameters are consistent with Grade I diastolic dysfunction (impaired relaxation). Right Ventricle: The right ventricular size is normal. No increase in right ventricular wall thickness. Right ventricular systolic function is normal. Left Atrium: Left atrial size was normal in size. Right Atrium: Right atrial size was normal in size. Pericardium: There is no evidence of pericardial effusion. Mitral Valve: The mitral valve is normal in structure. Normal mobility of the mitral valve leaflets. No evidence of mitral valve regurgitation. No evidence of mitral valve stenosis. Tricuspid Valve: The tricuspid valve is normal in structure. Tricuspid valve regurgitation is not demonstrated. No evidence of tricuspid stenosis. Aortic Valve: The aortic valve is normal in structure. Aortic valve regurgitation is not visualized. No aortic stenosis is present. Pulmonic Valve: The pulmonic valve was normal in structure. Pulmonic valve regurgitation is trivial. No evidence of pulmonic stenosis. Aorta: The aortic root is normal in size and structure. Venous: The inferior vena cava is normal in  size with greater than 50% respiratory variability, suggesting right atrial pressure of 3 mmHg. IAS/Shunts: No atrial level shunt detected by color flow Doppler.  LEFT VENTRICLE PLAX 2D LVIDd:         5.10 cm  Diastology LVIDs:  3.20 cm  LV e' lateral:   9.94 cm/s LV PW:         0.70 cm  LV E/e' lateral: 4.6 LV IVS:        0.70 cm  LV e' medial:    6.85 cm/s LVOT diam:     2.10 cm  LV E/e' medial:  6.6 LV SV:         81 LV SV Index:   43       2D Longitudinal Strain LVOT Area:     3.46 cm 2D Strain GLS Avg:     -17.6 %  RIGHT VENTRICLE RV S prime:     9.79 cm/s TAPSE (M-mode): 1.6 cm LEFT ATRIUM             Index       RIGHT ATRIUM           Index LA diam:        3.70 cm 1.94 cm/m  RA Area:     14.40 cm LA Vol (A2C):   69.5 ml 36.35 ml/m RA Volume:   31.90 ml  16.68 ml/m LA Vol (A4C):   51.0 ml 26.67 ml/m LA Biplane Vol: 59.4 ml 31.07 ml/m  AORTIC VALVE LVOT Vmax:   143.00 cm/s LVOT Vmean:  98.900 cm/s LVOT VTI:    0.235 m  AORTA Ao Root diam: 3.20 cm Ao Asc diam:  3.40 cm MITRAL VALVE MV Area (PHT): 2.22 cm    SHUNTS MV Decel Time: 342 msec    Systemic VTI:  0.24 m MV E velocity: 45.30 cm/s  Systemic Diam: 2.10 cm MV A velocity: 72.50 cm/s MV E/A ratio:  0.62 Candee Furbish MD Electronically signed by Candee Furbish MD Signature Date/Time: 05/22/2020/12:55:40 PM    Final    IR IMAGING GUIDED PORT INSERTION  Result Date: 05/19/2020 INDICATION: History of diffuse large B-cell lymphoma. In need of durable intravenous access for chemotherapy administration EXAM: IMPLANTED PORT A CATH PLACEMENT WITH ULTRASOUND AND FLUOROSCOPIC GUIDANCE COMPARISON:  PET-CT-04/28/2020 MEDICATIONS: Ancef 2 gm IV; The antibiotic was administered within an appropriate time interval prior to skin puncture. ANESTHESIA/SEDATION: Moderate (conscious) sedation was employed during this procedure. A total of Versed 2 mg and Fentanyl 100 mcg was administered intravenously. Moderate Sedation Time: 25 minutes. The patient's level of  consciousness and vital signs were monitored continuously by radiology nursing throughout the procedure under my direct supervision. CONTRAST:  None FLUOROSCOPY TIME:  24 seconds (5 mGy) COMPLICATIONS: None immediate. PROCEDURE: The procedure, risks, benefits, and alternatives were explained to the patient. Questions regarding the procedure were encouraged and answered. The patient understands and consents to the procedure. The right neck and chest were prepped with chlorhexidine in a sterile fashion, and a sterile drape was applied covering the operative field. Maximum barrier sterile technique with sterile gowns and gloves were used for the procedure. A timeout was performed prior to the initiation of the procedure. Local anesthesia was provided with 1% lidocaine with epinephrine. After creating a small venotomy incision, a micropuncture kit was utilized to access the internal jugular vein. Real-time ultrasound guidance was utilized for vascular access including the acquisition of a permanent ultrasound image documenting patency of the accessed vessel. The microwire was utilized to measure appropriate catheter length. A subcutaneous port pocket was then created along the upper chest wall utilizing a combination of sharp and blunt dissection. The pocket was irrigated with sterile saline. A single lumen "Slim" sized power injectable port was chosen for placement.  The 8 Fr catheter was tunneled from the port pocket site to the venotomy incision. The port was placed in the pocket. The external catheter was trimmed to appropriate length. At the venotomy, an 8 Fr peel-away sheath was placed over a guidewire under fluoroscopic guidance. The catheter was then placed through the sheath and the sheath was removed. Final catheter positioning was confirmed and documented with a fluoroscopic spot radiograph. The port was accessed with a Huber needle, aspirated and flushed with heparinized saline. The venotomy site was closed  with an interrupted 4-0 Vicryl suture. The port pocket incision was closed with interrupted 2-0 Vicryl suture. The skin was opposed with a running subcuticular 4-0 Vicryl suture. Dermabond and Steri-strips were applied to both incisions. Dressings were applied. The patient tolerated the procedure well without immediate post procedural complication. FINDINGS: After catheter placement, the tip lies within the superior cavoatrial junction. The catheter aspirates and flushes normally and is ready for immediate use. IMPRESSION: Successful placement of a right internal jugular approach power injectable Port-A-Cath. The catheter is ready for immediate use. Electronically Signed   By: Sandi Mariscal M.D.   On: 05/19/2020 15:59    Assessment and Plan:  Connor Adams 74 y.o. male with medical history significant for Stage I DLBCL who presents for a follow up visit.  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 Recommend for him to proceed with EPOCH-R as an inpatient.  He is here to begin cycle #2 today.   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 Stage I bulky disease.   --Recommend EPOCH-R -begin cycle number 29/27/2021. --Adverse effects have been reviewed including, but not limited to, alopecia, myelosuppression, peripheral neuropathy, mucositis, liver/renal dysfunction, and possible infusion reaction associated with Rituxan. He agrees to proceed. --PAC in place and ready for use. --Echocardiogram from 05/22/2020 shows LVEF of 60-65%  --CBC with differential, CMET, LDH, and uric acid are pending today.  Will review prior to initiating  chemotherapy. --Will monitor closely with CBC with diff, CMET, LDH, and uric acid --Continue allopurinol 300 mg daily.  --Lovenox for DVT prophylaxis. --Continue home medications for HTN and hyperlipidemia.  --Continue PRN antiemetics. --Begin Senokot-S 2 tablets at bedtime for constipation.  The patient will have Rituxan and Neulasta on Monday, 06/23/2020 and labs and a follow-up visit with Dr. Lorenso Courier on 06/25/2020.  [name], DNP, AGPCNP-BC, AOCNP

## 2020-06-16 NOTE — Progress Notes (Signed)
Chemotherapy dosages, total VTBI, 2 pharmacy signatures, expiration date/time, and patient identifiers independently checked by me and by Cline Crock, RN.

## 2020-06-17 LAB — COMPREHENSIVE METABOLIC PANEL
ALT: 60 U/L — ABNORMAL HIGH (ref 0–44)
AST: 32 U/L (ref 15–41)
Albumin: 3 g/dL — ABNORMAL LOW (ref 3.5–5.0)
Alkaline Phosphatase: 61 U/L (ref 38–126)
Anion gap: 8 (ref 5–15)
BUN: 23 mg/dL (ref 8–23)
CO2: 25 mmol/L (ref 22–32)
Calcium: 8.7 mg/dL — ABNORMAL LOW (ref 8.9–10.3)
Chloride: 105 mmol/L (ref 98–111)
Creatinine, Ser: 1.06 mg/dL (ref 0.61–1.24)
GFR calc Af Amer: >60 mL/min (ref >60)
GFR calc non Af Amer: >60 mL/min (ref >60)
Glucose, Bld: 148 mg/dL — ABNORMAL HIGH (ref 70–99)
Potassium: 4.2 mmol/L (ref 3.5–5.1)
Sodium: 138 mmol/L (ref 135–145)
Total Bilirubin: 0.1 mg/dL — ABNORMAL LOW (ref 0.3–1.2)
Total Protein: 5.7 g/dL — ABNORMAL LOW (ref 6.5–8.1)

## 2020-06-17 LAB — CBC WITH DIFFERENTIAL/PLATELET
Abs Immature Granulocytes: 0.32 10*3/uL — ABNORMAL HIGH (ref 0.00–0.07)
Basophils Absolute: 0 10*3/uL (ref 0.0–0.1)
Basophils Relative: 0 %
Eosinophils Absolute: 0 10*3/uL (ref 0.0–0.5)
Eosinophils Relative: 0 %
HCT: 29 % — ABNORMAL LOW (ref 39.0–52.0)
Hemoglobin: 9.4 g/dL — ABNORMAL LOW (ref 13.0–17.0)
Immature Granulocytes: 3 %
Lymphocytes Relative: 4 %
Lymphs Abs: 0.4 10*3/uL — ABNORMAL LOW (ref 0.7–4.0)
MCH: 29.1 pg (ref 26.0–34.0)
MCHC: 32.4 g/dL (ref 30.0–36.0)
MCV: 89.8 fL (ref 80.0–100.0)
Monocytes Absolute: 0.1 10*3/uL (ref 0.1–1.0)
Monocytes Relative: 1 %
Neutro Abs: 11.8 10*3/uL — ABNORMAL HIGH (ref 1.7–7.7)
Neutrophils Relative %: 92 %
Platelets: 331 10*3/uL (ref 150–400)
RBC: 3.23 MIL/uL — ABNORMAL LOW (ref 4.22–5.81)
RDW: 16 % — ABNORMAL HIGH (ref 11.5–15.5)
WBC: 12.7 10*3/uL — ABNORMAL HIGH (ref 4.0–10.5)
nRBC: 0 % (ref 0.0–0.2)

## 2020-06-17 LAB — URIC ACID: Uric Acid, Serum: 3.5 mg/dL — ABNORMAL LOW (ref 3.7–8.6)

## 2020-06-17 LAB — LACTATE DEHYDROGENASE: LDH: 143 U/L (ref 98–192)

## 2020-06-17 MED ORDER — VINCRISTINE SULFATE CHEMO INJECTION 1 MG/ML
Freq: Once | INTRAVENOUS | Status: AC
  Filled 2020-06-17: qty 10

## 2020-06-17 MED ORDER — SENNOSIDES-DOCUSATE SODIUM 8.6-50 MG PO TABS
2.0000 | ORAL_TABLET | Freq: Two times a day (BID) | ORAL | Status: DC
  Administered 2020-06-17 – 2020-06-20 (×6): 2 via ORAL
  Filled 2020-06-17 (×6): qty 2

## 2020-06-17 MED ORDER — ACETAMINOPHEN 325 MG PO TABS
650.0000 mg | ORAL_TABLET | Freq: Four times a day (QID) | ORAL | Status: DC | PRN
  Administered 2020-06-17: 650 mg via ORAL
  Filled 2020-06-17: qty 2

## 2020-06-17 MED ORDER — SODIUM CHLORIDE 0.9 % IV SOLN
Freq: Once | INTRAVENOUS | Status: AC
  Administered 2020-06-17: 18 mg via INTRAVENOUS
  Filled 2020-06-17: qty 4

## 2020-06-17 NOTE — Progress Notes (Signed)
Initial Nutrition Assessment  INTERVENTION:   -Ensure Enlive po BID, each supplement provides 350 kcal and 20 grams of protein -Magic cup BID with meals, each supplement provides 290 kcal and 9 grams of protein  NUTRITION DIAGNOSIS:   Increased nutrient needs related to cancer and cancer related treatments as evidenced by estimated needs.  GOAL:   Patient will meet greater than or equal to 90% of their needs  MONITOR:   PO intake, Supplement acceptance, Labs, Weight trends, I & O's  REASON FOR ASSESSMENT:   Malnutrition Screening Tool    ASSESSMENT:   74 y.o. 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 Admitted for double hit DLBCL to start cycle 2 of R-EPOCH.  Patient reports good appetite and no issues with PO at this time. Ensure supplements have been ordered and pt is drinking them.  Will add Magic cups for additional kcals and protein with meals.  Pt has continued to lose weight. Per weight records, pt has lost 24 lbs since 8/5 (13% wt loss x <2 months, significant for time frame).   Labs reviewed. Medications: Multivitamin with minerals daily, Senokot, Carafate, IV Zofran   NUTRITION - FOCUSED PHYSICAL EXAM:  Will attempt at follow-up.  Diet Order:   Diet Order            Diet regular Room service appropriate? Yes; Fluid consistency: Thin  Diet effective now                 EDUCATION NEEDS:   No education needs have been identified at this time  Skin:  Skin Assessment: Reviewed RN Assessment  Last BM:  9/27  Height:   Ht Readings from Last 1 Encounters:  06/16/20 5\' 8"  (1.727 m)    Weight:   Wt Readings from Last 1 Encounters:  06/16/20 71.7 kg   BMI:  Body mass index is 24.02 kg/m.  Estimated Nutritional Needs:   Kcal:  2150-2350  Protein:  105-120g  Fluid:  2.1L/day  Clayton Bibles, MS, RD, LDN Inpatient Clinical Dietitian Contact information available via Amion

## 2020-06-17 NOTE — Progress Notes (Signed)
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   1) 04/17/2020: CT A/P showed dominant nodal mass(11.5 x 10.9 cm)within the jejunal mesentery, highly suspicious for lymphoma. Additionally there are left lower lobe pleural-based pulmonary nodules 2) 04/24/2020: establish care with Dr. Lorenso Courier 3) 04/28/2020:  PET CT scan shows Large solid left mesenteric mass 13.0 cm with maximum SUV of 21.2, [place] 5 with local hypermetabolic porta hepatis and retroperitoneal lymph nodes. Constitutes bulky Stage I disease.   Reason for Admission: double hit lymphoma, Cycle #2 EPOCH-R  HPI: Connor Adams is a 74 year old male with medical history significant for double hit DLBCL. He presents for Cycle 2 of R-EPOCH chemotherapy. Risks and benefits have been discussed and he has agreed to proceed  Interval History: Has been tolerating chemotherapy well overall.  He denies nausea and vomiting.  He reports a good appetite without any nausea or vomiting.  Bowels move last evening.  He is taking Senokot-S at bedtime.  States that he has a good energy level and has been ambulating in the hallway without any difficulty.   Diagnosis Date  . Arthritis   . Chronic kidney disease (CKD), stage III (moderate)   . COVID-19   . GERD (gastroesophageal reflux disease)    occ  . Hemorrhoids   . History of ETT 3/05   low risk  . History of hiatal hernia   . History of kidney stones   . HLD (hyperlipidemia)   . HTN (hypertension)   . Left inguinal hernia   . Melanoma ([place])    excied  . 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 Stroke [facility]) 2011   tia   . TIA (transient ischemic attack) 4/11   associated w slurred speecha ndmild facial droop. lasted for about 10 minutes. Had  MRI, etc with [facility] that per his report was ok (dr. Stephannie Li). Echo (4/11): EF 55-60%, no regional WMAs, normal diastolic function, mild LAE, no source of embolus  Past Surgical History:  Procedure Laterality Date  . BMI 30.2 muscular  12/08/04  . curretage actinic keratosis R ear  01/20/05  . ETT low prob of CAD  11/21/03  . excision melanoma in situ back  01/20/05  . hemorrhoids sclerosed at colonoscopy  12/19/05  . IR IMAGING GUIDED PORT INSERTION  05/19/2020  . LAPAROSCOPIC INGUINAL HERNIA REPAIR Left 12/20/1995  . LUMBAR DISC SURGERY    . NERVE REPAIR     back of neck  . retinal laser surgery Left 09/20/1986  . SEPTOPLASTY  11/19/98  . TOTAL KNEE ARTHROPLASTY Left 12/13/2016   Procedure: TOTAL KNEE ARTHROPLASTY WITH RIGHT KNEE CORTISONE INJECTION;  Surgeon: Frederik Pear, MD;  Location: [place];  Service: Orthopedics;  Laterality: Left;  . uric acid 6.9  12/22/04  . x-ray l great toe  07/21/00       Fre Past Surgical History:  Procedure Laterality Date  . BMI 30.2 muscular  12/08/04  . curretage actinic keratosis R ear  01/20/05  . ETT low prob of CAD  11/21/03  . excision melanoma in situ back  01/20/05  . hemorrhoids sclerosed at colonoscopy  12/19/05  . IR IMAGING GUIDED PORT INSERTION  05/19/2020  . LAPAROSCOPIC INGUINAL HERNIA REPAIR Left 12/20/1995  . LUMBAR DISC SURGERY    .  NERVE REPAIR     back of neck  . retinal laser surgery Left 09/20/1986  . SEPTOPLASTY  11/19/98  . TOTAL KNEE ARTHROPLASTY Left 12/13/2016   Procedure: TOTAL KNEE ARTHROPLASTY WITH RIGHT KNEE CORTISONE INJECTION;  Surgeon: Frederik Pear, MD;  Location: [place];  Service: Orthopedics;  Laterality: Left;  . uric acid 6.9  12/22/04  . x-ray l great toe  07/21/00  quency Provider Last Rate Last Admin  . 0.9 %  sodium chloride infusion   Intravenous Continuous [name] IV, MD 20 mL/hr at 05/29/20 0400 Rate Verify at 05/29/20 0400  . allopurinol (ZYLOPRIM) tablet 300 mg  300 mg Oral Daily [name] R, NP   300  mg at 05/29/20 1038  . aspirin tablet 325 mg  325 mg Oral Q4H PRN Maryanna Shape, NP      . Chlorhexidine Gluconate Cloth 2 % PADS 6 each  6 each Topical Daily Maryanna Shape, NP   6 each at 05/29/20 1038  . Cold Pack 1 packet  1 packet Topical Once PRN [name] IV, MD      . DOXOrubicin (ADRIAMYCIN) 20 mg, etoposide (VEPESID) 96 mg, vinCRIStine (ONCOVIN) 0.8 mg in sodium chloride 0.9 % 1,000 mL chemo infusion   Intravenous Once Orson Slick, MD 51 mL/hr at 05/28/20 1422 New Bag at 05/28/20 1422  . DOXOrubicin (ADRIAMYCIN) 20 mg, etoposide (VEPESID) 96 mg, vinCRIStine (ONCOVIN) 0.8 mg in sodium chloride 0.9 % 1,000 mL chemo infusion   Intravenous Once Narda Rutherford T IV, MD      . enoxaparin (LOVENOX) injection 40 mg  40 mg Subcutaneous Q24H [name] R, NP   40 mg at 05/28/20 1116  . Hot Pack 1 packet  1 packet Topical Once PRN [name] IV, MD      . hydrocortisone (ANUSOL-HC) 2.5 % rectal cream 1 application  1 application Rectal Daily PRN Maryanna Shape, NP      . losartan (COZAAR) tablet 25 mg  25 mg Oral Daily [name] R, NP   25 mg at 05/29/20 1038  . multivitamin with minerals tablet 1 tablet  1 tablet Oral Daily Maryanna Shape, NP   1 tablet at 05/29/20 1038  . ondansetron (ZOFRAN-ODT) disintegrating tablet 8 mg  8 mg Oral Q8H PRN Julaine Zimny R, NP      . pantoprazole (PROTONIX) EC tablet 40 mg  40 mg Oral BID [name] R, NP   40 mg at 05/29/20 1038  . polyethylene glycol (MIRALAX / GLYCOLAX) packet 17 g  17 g Oral Daily PRN Narda Rutherford T IV, MD      . predniSONE (DELTASONE) tablet 120 mg  120 mg Oral Q breakfast [name] IV, MD   120 mg at 05/29/20 0819  . prochlorperazine (COMPAZINE) tablet 10 mg  10 mg Oral Q6H PRN [name] R, NP      . rosuvastatin (CRESTOR) tablet 5 mg  5 mg Oral QPM Nixie Laube R, NP   5 mg at 05/28/20 1744  . senna-docusate (Senokot-S) tablet 2 tablet  2 tablet Oral BID Orson Slick, MD   2  tablet at 05/29/20 1038  . sodium bicarbonate/sodium chloride mouthwash 1024ml   Mouth Rinse PRN Adlean Hardeman R, NP      . sodium chloride flush (NS) 0.9 % injection 10-40 mL  10-40 mL Intracatheter Q12H Maryanna Shape, NP   10 mL at 05/28/20 2129  .  sodium chloride flush (NS) 0.9 % injection 10-40 mL  10-40 mL Intracatheter PRN Frederik Standley R, NP      . sucralfate (CARAFATE) 1 GM/10ML suspension 1 g  1 g Oral TID WC & HS Aidenjames Heckmann, Roselie Awkward, NP   1 g at 05/29/20 9323       Family History  Problem Relation Age of Onset  . Liver cancer Sister   . Heart failure Brother 44       CABGx4  . Heart attack Mother 25  . Uterine cancer Mother   . Heart attack Father 12  . Asthma Son   . Diabetes Brother   . Prostate cancer Brother   Relation Age of Onset  . Liver cancer Sister   . Heart failure Brother 44       CABGx4  . Heart attack Mother 13  . Uterine cancer Mother   . Heart attack Father 31  . Asthma Son   . Diabetes Brother   . Prostate cancer Brother      Socioeconomic History  . Marital status: Married    Spouse name: [name]  . Number of children: 2  . Years of education: 25  . Highest education level: Not on file  Occupational History  . Occupation: Systems developer: [employer]  Tobacco Use  . Smoking status: Never Smoker  . Smokeless tobacco: Never Used  Vaping Use  . Vaping Use: Never used  Substance and Sexual Activity  . Alcohol use: No  . Drug use: No  . Sexual activity: Not on file  Other Topics Concern  . Not on file  Social History Narrative   Married to [employer] at [employer].   Son, [name] married   Son, [name], Married.    Caffeine use: 1 cup coffee per day   Social Determinants of Health   Financial Resource Strain:   . Difficulty of Paying Living Expenses: Not on file  Food Insecurity:   . Worried About Charity fundraiser in the Last Year: Not on file  . Ran Out of Food in the Last Year: Not on file   Transportation Needs:   . Lack of Transportation (Medical): Not on file  . Lack of Transportation (Non-Medical): Not on file  Physical Activity:   . Days of Exercise per Week: Not on file  . Minutes of Exercise per Session: Not on file  Stress:   . Feeling of Stress : Not on file  Social Connections:   . Frequency of Communication with Friends and Family: Not on file  . Frequency of Social Gatherings with Friends and Family: Not on file  . Attends Religious Services: Not on file  . Active Member of Clubs or Organizations: Not on file  . Attends Archivist Meetings: Not on file  . Marital Status: Not on file  Intimate Partner Violence:   . Fear of Current or Ex-Partner: Not on file  . Emotionally Abused: Not on file  . Physically Abused: Not on file  . Sexually Abused: Not on file  :  Review of Systems: A comprehensive 14 point review of systems was negative except as noted in the HPI.  Exam: Patient Vitals for the past 24 hrs:  BP Temp Temp src Pulse Resp SpO2  05/29/20 0600 107/60 98 F (36.7 C) Oral (!) 56 20 97 %  05/28/20 2222 125/68 (!) 97.4 F (36.3 C) Oral 60 20 98 %  05/28/20 1503 113/75 (!) 97.4 F (36.3 C) Oral (!) 53 18 96 %    General:  well-nourished in no acute distress.   Eyes:  no scleral icterus.   ENT:  There were no oropharyngeal lesions.   Neck was without thyromegaly.   Lymphatics:  Negative cervical, supraclavicular or axillary adenopathy.   Respiratory: lungs were clear bilaterally without wheezing or crackles.   Cardiovascular:  Regular rate and rhythm, S1/S2, without murmur, rub or gallop.  There was no pedal edema.   GI:  Positive bowel sounds.  Left upper quadrant mass not palpable. Musculoskeletal:  no spinal tenderness of palpation of vertebral spine.   Skin exam was without echymosis, petichae.   Neuro exam was nonfocal. Patient was alert and oriented.  Attention was good.   Language was appropriate.  Mood was normal without  depression.  Speech was not pressured.  Thought content was not tangential.     Lab Results  Component Value Date   WBC 17.9 (H) 05/29/2020   HGB 9.8 (L) 05/29/2020   HCT 30.3 (L) 05/29/2020   PLT 351 05/29/2020   GLUCOSE 141 (H) 05/29/2020   CHOL  12/31/2009    160        ATP III CLASSIFICATION:  <200     mg/dL   Desirable  200-239  mg/dL   Borderline High  >=240    mg/dL   High          TRIG 213 (H) 12/31/2009   HDL 28 (L) 12/31/2009   LDLCALC  12/31/2009    89        Total Cholesterol/HDL:CHD Risk Coronary Heart Disease Risk Table                     Men   Women  1/2 Average Risk   3.4   3.3  Average Risk       5.0   4.4  2 X Average Risk   9.6   7.1  3 X Average Risk  23.4   11.0        Use the calculated Patient Ratio above and the CHD Risk Table to determine the patient's CHD Risk.        ATP III CLASSIFICATION (LDL):  <100     mg/dL   Optimal  100-129  mg/dL   Near or Above                    Optimal  130-159  mg/dL   Borderline  160-189  mg/dL   High  >190     mg/dL   Very High   ALT 34 05/29/2020   AST 29 05/29/2020   NA 139 05/29/2020   K 4.0 05/29/2020   CL 109 05/29/2020   CREATININE 1.09 05/29/2020   BUN 27 (H) 05/29/2020   CO2 24 05/29/2020    CT Biopsy  Result Date: 05/08/2020 CLINICAL DATA:  13 cm left-sided mesenteric abdominal mass. EXAM: CT GUIDED CORE BIOPSY OF MESENTERIC MASS ANESTHESIA/SEDATION: 1.5 mg IV Versed; 75 mcg IV Fentanyl Total Moderate Sedation Time:  18 minutes. The patient's level of consciousness and physiologic status were continuously monitored during the procedure by Radiology nursing. PROCEDURE: The procedure risks, benefits, and alternatives were explained to the patient. Questions regarding the procedure were encouraged and answered. The patient understands and consents to the procedure. A time-out was performed prior to initiating the procedure. CT was performed through the abdomen in a supine position. The  left abdominal  wall was prepped with chlorhexidine in a sterile fashion, and a sterile drape was applied covering the operative field. A sterile gown and sterile gloves were used for the procedure. Local anesthesia was provided with 1% Lidocaine. Under CT guidance, a 17 gauge trocar needle was advanced to the level of a left-sided mesenteric mass. After confirming needle tip position, 4 separate coaxial 18 gauge core biopsy samples were obtained and submitted in saline. Some Gel-Foam pledgets were advanced through the outer needle as the needle was retracted and removed. COMPLICATIONS: None FINDINGS: Large left-sided intra-abdominal mass within the mesentery measures up to 13.3 cm in maximum diameter. Solid tissue was obtained. IMPRESSION: CT-guided core biopsy performed of large left-sided mesenteric mass measuring just over 13 cm in estimated maximal diameter. Electronically Signed   By: Irish Lack M.D.   On: 05/08/2020 15:02   ECHOCARDIOGRAM COMPLETE  Result Date: 05/22/2020    ECHOCARDIOGRAM REPORT   Patient Name:   ASHELY JOSHUA [facility] Date of Exam: 05/22/2020 Medical Rec #:  [id number]        Height:       68.0 in Accession #:    [id number]       Weight:       171.0 lb Date of Birth:  12/07/1945        BSA:          1.912 m Patient Age:    73 years         BP:           116/77 mmHg Patient Gender: M                HR:           80 bpm. Exam Location:  Outpatient Procedure: 2D Echo, Cardiac Doppler and Color Doppler Indications:    Chemotherapy evaluation v87.41 / v58.11  History:        Patient has no prior history of Echocardiogram examinations. TIA                 and Stroke; Risk Factors:Hypertension, Dyslipidemia and GERD.  Sonographer:    Eulah Pont RDCS Referring Phys: [id number] Jackquline Denmark DORSEY IV IMPRESSIONS  1. Left ventricular ejection fraction, by estimation, is 60 to 65%. The left ventricle has normal function. The left ventricle has no regional wall motion abnormalities. Left ventricular diastolic parameters are  consistent with Grade I diastolic dysfunction (impaired relaxation). The average left ventricular global longitudinal strain is -17.6 %. The global longitudinal strain is normal.  2. Right ventricular systolic function is normal. The right ventricular size is normal.  3. The mitral valve is normal in structure. No evidence of mitral valve regurgitation. No evidence of mitral stenosis.  4. The aortic valve is normal in structure. Aortic valve regurgitation is not visualized. No aortic stenosis is present.  5. The inferior vena cava is normal in size with greater than 50% respiratory variability, suggesting right atrial pressure of 3 mmHg. FINDINGS  Left Ventricle: Left ventricular ejection fraction, by estimation, is 60 to 65%. The left ventricle has normal function. The left ventricle has no regional wall motion abnormalities. The average left ventricular global longitudinal strain is -17.6 %. The global longitudinal strain is normal. The left ventricular internal cavity size was normal in size. There is no left ventricular hypertrophy. Left ventricular diastolic parameters are consistent with Grade I diastolic dysfunction (impaired relaxation). Right Ventricle: The right ventricular size is normal. No increase in right ventricular wall thickness.  Right ventricular systolic function is normal. Left Atrium: Left atrial size was normal in size. Right Atrium: Right atrial size was normal in size. Pericardium: There is no evidence of pericardial effusion. Mitral Valve: The mitral valve is normal in structure. Normal mobility of the mitral valve leaflets. No evidence of mitral valve regurgitation. No evidence of mitral valve stenosis. Tricuspid Valve: The tricuspid valve is normal in structure. Tricuspid valve regurgitation is not demonstrated. No evidence of tricuspid stenosis. Aortic Valve: The aortic valve is normal in structure. Aortic valve regurgitation is not visualized. No aortic stenosis is present. Pulmonic  Valve: The pulmonic valve was normal in structure. Pulmonic valve regurgitation is trivial. No evidence of pulmonic stenosis. Aorta: The aortic root is normal in size and structure. Venous: The inferior vena cava is normal in size with greater than 50% respiratory variability, suggesting right atrial pressure of 3 mmHg. IAS/Shunts: No atrial level shunt detected by color flow Doppler.  LEFT VENTRICLE PLAX 2D LVIDd:         5.10 cm  Diastology LVIDs:         3.20 cm  LV e' lateral:   9.94 cm/s LV PW:         0.70 cm  LV E/e' lateral: 4.6 LV IVS:        0.70 cm  LV e' medial:    6.85 cm/s LVOT diam:     2.10 cm  LV E/e' medial:  6.6 LV SV:         81 LV SV Index:   43       2D Longitudinal Strain LVOT Area:     3.46 cm 2D Strain GLS Avg:     -17.6 %  RIGHT VENTRICLE RV S prime:     9.79 cm/s TAPSE (M-mode): 1.6 cm LEFT ATRIUM             Index       RIGHT ATRIUM           Index LA diam:        3.70 cm 1.94 cm/m  RA Area:     14.40 cm LA Vol (A2C):   69.5 ml 36.35 ml/m RA Volume:   31.90 ml  16.68 ml/m LA Vol (A4C):   51.0 ml 26.67 ml/m LA Biplane Vol: 59.4 ml 31.07 ml/m  AORTIC VALVE LVOT Vmax:   143.00 cm/s LVOT Vmean:  98.900 cm/s LVOT VTI:    0.235 m  AORTA Ao Root diam: 3.20 cm Ao Asc diam:  3.40 cm MITRAL VALVE MV Area (PHT): 2.22 cm    SHUNTS MV Decel Time: 342 msec    Systemic VTI:  0.24 m MV E velocity: 45.30 cm/s  Systemic Diam: 2.10 cm MV A velocity: 72.50 cm/s MV E/A ratio:  0.62 Candee Furbish MD Electronically signed by Candee Furbish MD Signature Date/Time: 05/22/2020/12:55:40 PM    Final    IR IMAGING GUIDED PORT INSERTION  Result Date: 05/19/2020 INDICATION: History of diffuse large B-cell lymphoma. In need of durable intravenous access for chemotherapy administration EXAM: IMPLANTED PORT A CATH PLACEMENT WITH ULTRASOUND AND FLUOROSCOPIC GUIDANCE COMPARISON:  PET-CT-04/28/2020 MEDICATIONS: Ancef 2 gm IV; The antibiotic was administered within an appropriate time interval prior to skin puncture.  ANESTHESIA/SEDATION: Moderate (conscious) sedation was employed during this procedure. A total of Versed 2 mg and Fentanyl 100 mcg was administered intravenously. Moderate Sedation Time: 25 minutes. The patient's level of consciousness and vital signs were monitored continuously by radiology nursing throughout the procedure  under my direct supervision. CONTRAST:  None FLUOROSCOPY TIME:  24 seconds (5 mGy) COMPLICATIONS: None immediate. PROCEDURE: The procedure, risks, benefits, and alternatives were explained to the patient. Questions regarding the procedure were encouraged and answered. The patient understands and consents to the procedure. The right neck and chest were prepped with chlorhexidine in a sterile fashion, and a sterile drape was applied covering the operative field. Maximum barrier sterile technique with sterile gowns and gloves were used for the procedure. A timeout was performed prior to the initiation of the procedure. Local anesthesia was provided with 1% lidocaine with epinephrine. After creating a small venotomy incision, a micropuncture kit was utilized to access the internal jugular vein. Real-time ultrasound guidance was utilized for vascular access including the acquisition of a permanent ultrasound image documenting patency of the accessed vessel. The microwire was utilized to measure appropriate catheter length. A subcutaneous port pocket was then created along the upper chest wall utilizing a combination of sharp and blunt dissection. The pocket was irrigated with sterile saline. A single lumen "Slim" sized power injectable port was chosen for placement. The 8 Fr catheter was tunneled from the port pocket site to the venotomy incision. The port was placed in the pocket. The external catheter was trimmed to appropriate length. At the venotomy, an 8 Fr peel-away sheath was placed over a guidewire under fluoroscopic guidance. The catheter was then placed through the sheath and the sheath was  removed. Final catheter positioning was confirmed and documented with a fluoroscopic spot radiograph. The port was accessed with a Huber needle, aspirated and flushed with heparinized saline. The venotomy site was closed with an interrupted 4-0 Vicryl suture. The port pocket incision was closed with interrupted 2-0 Vicryl suture. The skin was opposed with a running subcuticular 4-0 Vicryl suture. Dermabond and Steri-strips were applied to both incisions. Dressings were applied. The patient tolerated the procedure well without immediate post procedural complication. FINDINGS: After catheter placement, the tip lies within the superior cavoatrial junction. The catheter aspirates and flushes normally and is ready for immediate use. IMPRESSION: Successful placement of a right internal jugular approach power injectable Port-A-Cath. The catheter is ready for immediate use. Electronically Signed   By: Sandi Mariscal M.D.   On: 05/19/2020 15:59     CT Biopsy  Result Date: 05/08/2020 CLINICAL DATA:  13 cm left-sided mesenteric abdominal mass. EXAM: CT GUIDED CORE BIOPSY OF MESENTERIC MASS ANESTHESIA/SEDATION: 1.5 mg IV Versed; 75 mcg IV Fentanyl Total Moderate Sedation Time:  18 minutes. The patient's level of consciousness and physiologic status were continuously monitored during the procedure by Radiology nursing. PROCEDURE: The procedure risks, benefits, and alternatives were explained to the patient. Questions regarding the procedure were encouraged and answered. The patient understands and consents to the procedure. A time-out was performed prior to initiating the procedure. CT was performed through the abdomen in a supine position. The left abdominal wall was prepped with chlorhexidine in a sterile fashion, and a sterile drape was applied covering the operative field. A sterile gown and sterile gloves were used for the procedure. Local anesthesia was provided with 1% Lidocaine. Under CT guidance, a 17 gauge trocar  needle was advanced to the level of a left-sided mesenteric mass. After confirming needle tip position, 4 separate coaxial 18 gauge core biopsy samples were obtained and submitted in saline. Some Gel-Foam pledgets were advanced through the outer needle as the needle was retracted and removed. COMPLICATIONS: None FINDINGS: Large left-sided intra-abdominal mass within the mesentery measures up  to 13.3 cm in maximum diameter. Solid tissue was obtained. IMPRESSION: CT-guided core biopsy performed of large left-sided mesenteric mass measuring just over 13 cm in estimated maximal diameter. Electronically Signed   By: Aletta Edouard M.D.   On: 05/08/2020 15:02   ECHOCARDIOGRAM COMPLETE  Result Date: 05/22/2020    ECHOCARDIOGRAM REPORT   Patient Name:   GRIER VU [facility] Date of Exam: 05/22/2020 Medical Rec #:  [id number]        Height:       68.0 in Accession #:    [id number]       Weight:       171.0 lb Date of Birth:  Apr 28, 1946        BSA:          1.912 m Patient Age:    48 years         BP:           116/77 mmHg Patient Gender: M                HR:           80 bpm. Exam Location:  Outpatient Procedure: 2D Echo, Cardiac Doppler and Color Doppler Indications:    Chemotherapy evaluation v87.41 / v58.11  History:        Patient has no prior history of Echocardiogram examinations. TIA                 and Stroke; Risk Factors:Hypertension, Dyslipidemia and GERD.  Sonographer:    Bernadene Person RDCS Referring Phys: [id number] [place]  1. Left ventricular ejection fraction, by estimation, is 60 to 65%. The left ventricle has normal function. The left ventricle has no regional wall motion abnormalities. Left ventricular diastolic parameters are consistent with Grade I diastolic dysfunction (impaired relaxation). The average left ventricular global longitudinal strain is -17.6 %. The global longitudinal strain is normal.  2. Right ventricular systolic function is normal. The right ventricular size is normal.   3. The mitral valve is normal in structure. No evidence of mitral valve regurgitation. No evidence of mitral stenosis.  4. The aortic valve is normal in structure. Aortic valve regurgitation is not visualized. No aortic stenosis is present.  5. The inferior vena cava is normal in size with greater than 50% respiratory variability, suggesting right atrial pressure of 3 mmHg. FINDINGS  Left Ventricle: Left ventricular ejection fraction, by estimation, is 60 to 65%. The left ventricle has normal function. The left ventricle has no regional wall motion abnormalities. The average left ventricular global longitudinal strain is -17.6 %. The global longitudinal strain is normal. The left ventricular internal cavity size was normal in size. There is no left ventricular hypertrophy. Left ventricular diastolic parameters are consistent with Grade I diastolic dysfunction (impaired relaxation). Right Ventricle: The right ventricular size is normal. No increase in right ventricular wall thickness. Right ventricular systolic function is normal. Left Atrium: Left atrial size was normal in size. Right Atrium: Right atrial size was normal in size. Pericardium: There is no evidence of pericardial effusion. Mitral Valve: The mitral valve is normal in structure. Normal mobility of the mitral valve leaflets. No evidence of mitral valve regurgitation. No evidence of mitral valve stenosis. Tricuspid Valve: The tricuspid valve is normal in structure. Tricuspid valve regurgitation is not demonstrated. No evidence of tricuspid stenosis. Aortic Valve: The aortic valve is normal in structure. Aortic valve regurgitation is not visualized. No aortic stenosis is present. Pulmonic Valve: The pulmonic valve  was normal in structure. Pulmonic valve regurgitation is trivial. No evidence of pulmonic stenosis. Aorta: The aortic root is normal in size and structure. Venous: The inferior vena cava is normal in size with greater than 50% respiratory  variability, suggesting right atrial pressure of 3 mmHg. IAS/Shunts: No atrial level shunt detected by color flow Doppler.  LEFT VENTRICLE PLAX 2D LVIDd:         5.10 cm  Diastology LVIDs:         3.20 cm  LV e' lateral:   9.94 cm/s LV PW:         0.70 cm  LV E/e' lateral: 4.6 LV IVS:        0.70 cm  LV e' medial:    6.85 cm/s LVOT diam:     2.10 cm  LV E/e' medial:  6.6 LV SV:         81 LV SV Index:   43       2D Longitudinal Strain LVOT Area:     3.46 cm 2D Strain GLS Avg:     -17.6 %  RIGHT VENTRICLE RV S prime:     9.79 cm/s TAPSE (M-mode): 1.6 cm LEFT ATRIUM             Index       RIGHT ATRIUM           Index LA diam:        3.70 cm 1.94 cm/m  RA Area:     14.40 cm LA Vol (A2C):   69.5 ml 36.35 ml/m RA Volume:   31.90 ml  16.68 ml/m LA Vol (A4C):   51.0 ml 26.67 ml/m LA Biplane Vol: 59.4 ml 31.07 ml/m  AORTIC VALVE LVOT Vmax:   143.00 cm/s LVOT Vmean:  98.900 cm/s LVOT VTI:    0.235 m  AORTA Ao Root diam: 3.20 cm Ao Asc diam:  3.40 cm MITRAL VALVE MV Area (PHT): 2.22 cm    SHUNTS MV Decel Time: 342 msec    Systemic VTI:  0.24 m MV E velocity: 45.30 cm/s  Systemic Diam: 2.10 cm MV A velocity: 72.50 cm/s MV E/A ratio:  0.62 Candee Furbish MD Electronically signed by Candee Furbish MD Signature Date/Time: 05/22/2020/12:55:40 PM    Final    IR IMAGING GUIDED PORT INSERTION  Result Date: 05/19/2020 INDICATION: History of diffuse large B-cell lymphoma. In need of durable intravenous access for chemotherapy administration EXAM: IMPLANTED PORT A CATH PLACEMENT WITH ULTRASOUND AND FLUOROSCOPIC GUIDANCE COMPARISON:  PET-CT-04/28/2020 MEDICATIONS: Ancef 2 gm IV; The antibiotic was administered within an appropriate time interval prior to skin puncture. ANESTHESIA/SEDATION: Moderate (conscious) sedation was employed during this procedure. A total of Versed 2 mg and Fentanyl 100 mcg was administered intravenously. Moderate Sedation Time: 25 minutes. The patient's level of consciousness and vital signs were monitored  continuously by radiology nursing throughout the procedure under my direct supervision. CONTRAST:  None FLUOROSCOPY TIME:  24 seconds (5 mGy) COMPLICATIONS: None immediate. PROCEDURE: The procedure, risks, benefits, and alternatives were explained to the patient. Questions regarding the procedure were encouraged and answered. The patient understands and consents to the procedure. The right neck and chest were prepped with chlorhexidine in a sterile fashion, and a sterile drape was applied covering the operative field. Maximum barrier sterile technique with sterile gowns and gloves were used for the procedure. A timeout was performed prior to the initiation of the procedure. Local anesthesia was provided with 1% lidocaine with epinephrine. After creating a  small venotomy incision, a micropuncture kit was utilized to access the internal jugular vein. Real-time ultrasound guidance was utilized for vascular access including the acquisition of a permanent ultrasound image documenting patency of the accessed vessel. The microwire was utilized to measure appropriate catheter length. A subcutaneous port pocket was then created along the upper chest wall utilizing a combination of sharp and blunt dissection. The pocket was irrigated with sterile saline. A single lumen "Slim" sized power injectable port was chosen for placement. The 8 Fr catheter was tunneled from the port pocket site to the venotomy incision. The port was placed in the pocket. The external catheter was trimmed to appropriate length. At the venotomy, an 8 Fr peel-away sheath was placed over a guidewire under fluoroscopic guidance. The catheter was then placed through the sheath and the sheath was removed. Final catheter positioning was confirmed and documented with a fluoroscopic spot radiograph. The port was accessed with a Huber needle, aspirated and flushed with heparinized saline. The venotomy site was closed with an interrupted 4-0 Vicryl suture. The  port pocket incision was closed with interrupted 2-0 Vicryl suture. The skin was opposed with a running subcuticular 4-0 Vicryl suture. Dermabond and Steri-strips were applied to both incisions. Dressings were applied. The patient tolerated the procedure well without immediate post procedural complication. FINDINGS: After catheter placement, the tip lies within the superior cavoatrial junction. The catheter aspirates and flushes normally and is ready for immediate use. IMPRESSION: Successful placement of a right internal jugular approach power injectable Port-A-Cath. The catheter is ready for immediate use. Electronically Signed   By: Sandi Mariscal M.D.   On: 05/19/2020 15:59    Assessment and Plan:  Connor Adams 74 y.o. male with medical history significant for Stage I DLBCL who presents for a follow up visit.  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  He is currently receiving cycle #2 of EPOCH-R starting on 06/16/2020.  Today he is Cycle 2 Day 2 of treatment.  He is tolerating his chemotherapy well so far with no nausea or vomiting.  Bowels are moving and he has a good appetite.  There are no barriers to proceeding with treatment.      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 Stage I bulky disease.   --Continue EPOCH-R. Today is Cycle 2 Day 2 --Adverse effects have been reviewed including, but not limited to, alopecia, myelosuppression, peripheral neuropathy, mucositis, liver/renal dysfunction, and possible infusion reaction associated with Rituxan. He agrees to proceed. --PAC in place and ready for use. --Echocardiogram  from 05/22/2020 shows LVEF of 60-65%  --CBC adequate to proceed with treatment today.  Leukocytosis likely related to steroids.  Anemia slightly worse this morning.  CMET, LDH, uric acid have been redrawn and are currently pending. --Will monitor closely with CBC with diff, CMET, LDH, and uric acid --continue Allopurinol 300 mg daily.  --Lovenox for DVT prophylaxis. --Continue home medications for HTN and hyperlipidemia.  --Continue PRN antiemetics. --Continue Senokot.  The patient will return to the cancer on 06/23/2020 for Rituxan and Neulasta and will have labs and a follow-up visit on 06/25/2020.  [name], DNP, AGPCNP-BC, AOCNP Mon/Tues/Thurs/Fri 7am-5pm;  Off Wednesdays Cell: [phone]

## 2020-06-18 LAB — COMPREHENSIVE METABOLIC PANEL
ALT: 46 U/L — ABNORMAL HIGH (ref 0–44)
AST: 23 U/L (ref 15–41)
Albumin: 2.9 g/dL — ABNORMAL LOW (ref 3.5–5.0)
Alkaline Phosphatase: 60 U/L (ref 38–126)
Anion gap: 8 (ref 5–15)
BUN: 29 mg/dL — ABNORMAL HIGH (ref 8–23)
CO2: 26 mmol/L (ref 22–32)
Calcium: 8.6 mg/dL — ABNORMAL LOW (ref 8.9–10.3)
Chloride: 109 mmol/L (ref 98–111)
Creatinine, Ser: 1.03 mg/dL (ref 0.61–1.24)
GFR calc Af Amer: >60 mL/min (ref >60)
GFR calc non Af Amer: >60 mL/min (ref >60)
Glucose, Bld: 158 mg/dL — ABNORMAL HIGH (ref 70–99)
Potassium: 4.2 mmol/L (ref 3.5–5.1)
Sodium: 143 mmol/L (ref 135–145)
Total Bilirubin: 0.2 mg/dL — ABNORMAL LOW (ref 0.3–1.2)
Total Protein: 5.2 g/dL — ABNORMAL LOW (ref 6.5–8.1)

## 2020-06-18 LAB — CBC WITH DIFFERENTIAL/PLATELET
Abs Immature Granulocytes: 0.54 10*3/uL — ABNORMAL HIGH (ref 0.00–0.07)
Basophils Absolute: 0 10*3/uL (ref 0.0–0.1)
Basophils Relative: 0 %
Eosinophils Absolute: 0 10*3/uL (ref 0.0–0.5)
Eosinophils Relative: 0 %
HCT: 28.4 % — ABNORMAL LOW (ref 39.0–52.0)
Hemoglobin: 8.9 g/dL — ABNORMAL LOW (ref 13.0–17.0)
Immature Granulocytes: 2 %
Lymphocytes Relative: 2 %
Lymphs Abs: 0.5 10*3/uL — ABNORMAL LOW (ref 0.7–4.0)
MCH: 29 pg (ref 26.0–34.0)
MCHC: 31.3 g/dL (ref 30.0–36.0)
MCV: 92.5 fL (ref 80.0–100.0)
Monocytes Absolute: 0.9 10*3/uL (ref 0.1–1.0)
Monocytes Relative: 3 %
Neutro Abs: 23.1 10*3/uL — ABNORMAL HIGH (ref 1.7–7.7)
Neutrophils Relative %: 93 %
Platelets: 352 10*3/uL (ref 150–400)
RBC: 3.07 MIL/uL — ABNORMAL LOW (ref 4.22–5.81)
RDW: 16.5 % — ABNORMAL HIGH (ref 11.5–15.5)
WBC: 25.1 10*3/uL — ABNORMAL HIGH (ref 4.0–10.5)
nRBC: 0 % (ref 0.0–0.2)

## 2020-06-18 LAB — LACTATE DEHYDROGENASE: LDH: 129 U/L (ref 98–192)

## 2020-06-18 LAB — URIC ACID: Uric Acid, Serum: 3.3 mg/dL — ABNORMAL LOW (ref 3.7–8.6)

## 2020-06-18 MED ORDER — VINCRISTINE SULFATE CHEMO INJECTION 1 MG/ML
Freq: Once | INTRAVENOUS | Status: AC
  Filled 2020-06-18: qty 10

## 2020-06-18 MED ORDER — SODIUM CHLORIDE 0.9 % IV SOLN
Freq: Once | INTRAVENOUS | Status: AC
  Administered 2020-06-18: 18 mg via INTRAVENOUS
  Filled 2020-06-18: qty 4

## 2020-06-18 NOTE — Progress Notes (Signed)
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   1) 04/17/2020: CT A/P showed dominant nodal mass(11.5 x 10.9 cm)within the jejunal mesentery, highly suspicious for lymphoma. Additionally there are left lower lobe pleural-based pulmonary nodules 2) 04/24/2020: establish care with Dr. Lorenso Courier 3) 04/28/2020:  PET CT scan shows Large solid left mesenteric mass 13.0 cm with maximum SUV of 21.2, [place] 5 with local hypermetabolic porta hepatis and retroperitoneal lymph nodes. Constitutes bulky Stage I disease.   Reason for Admission: double hit lymphoma, Cycle #2 EPOCH-R  HPI: Mr. Monnin is a 74 year old male with medical history significant for double hit DLBCL. He presents for Cycle 2 of R-EPOCH chemotherapy. Risks and benefits have been discussed and he has agreed to proceed  Interval History: --BM this AM. Softer, but still firm and small. Start miralax last night --appetite is excellent --energy is good, patient is ambulating up and down the hallway.  --no f/c/s nausea, vomiting, or diarrhea.    Diagnosis Date  . Arthritis   . Chronic kidney disease (CKD), stage III (moderate)   . COVID-19   . GERD (gastroesophageal reflux disease)    occ  . Hemorrhoids   . History of ETT 3/05   low risk  . History of hiatal hernia   . History of kidney stones   . HLD (hyperlipidemia)   . HTN (hypertension)   . Left inguinal hernia   . Melanoma ([place])    excied  . 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 Stroke [facility]) 2011   tia   . TIA (transient ischemic attack) 4/11   associated w slurred speecha ndmild facial droop. lasted for about 10 minutes. Had MRI, etc with [facility] that per his report was ok (dr. Stephannie Li). Echo (4/11): EF  55-60%, no regional WMAs, normal diastolic function, mild LAE, no source of embolus  Past Surgical History:  Procedure Laterality Date  . BMI 30.2 muscular  12/08/04  . curretage actinic keratosis R ear  01/20/05  . ETT low prob of CAD  11/21/03  . excision melanoma in situ back  01/20/05  . hemorrhoids sclerosed at colonoscopy  12/19/05  . IR IMAGING GUIDED PORT INSERTION  05/19/2020  . LAPAROSCOPIC INGUINAL HERNIA REPAIR Left 12/20/1995  . LUMBAR DISC SURGERY    . NERVE REPAIR     back of neck  . retinal laser surgery Left 09/20/1986  . SEPTOPLASTY  11/19/98  . TOTAL KNEE ARTHROPLASTY Left 12/13/2016   Procedure: TOTAL KNEE ARTHROPLASTY WITH RIGHT KNEE CORTISONE INJECTION;  Surgeon: Frederik Pear, MD;  Location: [place];  Service: Orthopedics;  Laterality: Left;  . uric acid 6.9  12/22/04  . x-ray l great toe  07/21/00       Fre Past Surgical History:  Procedure Laterality Date  . BMI 30.2 muscular  12/08/04  . curretage actinic keratosis R ear  01/20/05  . ETT low prob of CAD  11/21/03  . excision melanoma in situ back  01/20/05  . hemorrhoids sclerosed at colonoscopy  12/19/05  . IR IMAGING GUIDED PORT INSERTION  05/19/2020  . LAPAROSCOPIC INGUINAL HERNIA REPAIR Left 12/20/1995  . LUMBAR DISC SURGERY    . NERVE REPAIR     back of neck  . retinal laser surgery Left 09/20/1986  .  SEPTOPLASTY  11/19/98  . TOTAL KNEE ARTHROPLASTY Left 12/13/2016   Procedure: TOTAL KNEE ARTHROPLASTY WITH RIGHT KNEE CORTISONE INJECTION;  Surgeon: Gean Birchwood, MD;  Location: [facility];  Service: Orthopedics;  Laterality: Left;  . uric acid 6.9  12/22/04  . x-ray l great toe  07/21/00  quency Provider Last Rate Last Admin  . 0.9 %  sodium chloride infusion   Intravenous Continuous Ulysees Barns IV, MD 20 mL/hr at 05/29/20 0400 Rate Verify at 05/29/20 0400  . allopurinol (ZYLOPRIM) tablet 300 mg  300 mg Oral Daily Clenton Pare R, NP   300 mg at 05/29/20 1038  . aspirin tablet 325 mg  325 mg Oral Q4H PRN Myrtis Ser, NP       . Chlorhexidine Gluconate Cloth 2 % PADS 6 each  6 each Topical Daily Myrtis Ser, NP   6 each at 05/29/20 1038  . Cold Pack 1 packet  1 packet Topical Once PRN Ulysees Barns IV, MD      . DOXOrubicin (ADRIAMYCIN) 20 mg, etoposide (VEPESID) 96 mg, vinCRIStine (ONCOVIN) 0.8 mg in sodium chloride 0.9 % 1,000 mL chemo infusion   Intravenous Once Jaci Standard, MD 51 mL/hr at 05/28/20 1422 New Bag at 05/28/20 1422  . DOXOrubicin (ADRIAMYCIN) 20 mg, etoposide (VEPESID) 96 mg, vinCRIStine (ONCOVIN) 0.8 mg in sodium chloride 0.9 % 1,000 mL chemo infusion   Intravenous Once Jeanie Sewer T IV, MD      . enoxaparin (LOVENOX) injection 40 mg  40 mg Subcutaneous Q24H Clenton Pare R, NP   40 mg at 05/28/20 1116  . Hot Pack 1 packet  1 packet Topical Once PRN Ulysees Barns IV, MD      . hydrocortisone (ANUSOL-HC) 2.5 % rectal cream 1 application  1 application Rectal Daily PRN Myrtis Ser, NP      . losartan (COZAAR) tablet 25 mg  25 mg Oral Daily Clenton Pare R, NP   25 mg at 05/29/20 1038  . multivitamin with minerals tablet 1 tablet  1 tablet Oral Daily Myrtis Ser, NP   1 tablet at 05/29/20 1038  . ondansetron (ZOFRAN-ODT) disintegrating tablet 8 mg  8 mg Oral Q8H PRN Curcio, Kristin R, NP      . pantoprazole (PROTONIX) EC tablet 40 mg  40 mg Oral BID Clenton Pare R, NP   40 mg at 05/29/20 1038  . polyethylene glycol (MIRALAX / GLYCOLAX) packet 17 g  17 g Oral Daily PRN Jeanie Sewer T IV, MD      . predniSONE (DELTASONE) tablet 120 mg  120 mg Oral Q breakfast Ulysees Barns IV, MD   120 mg at 05/29/20 0819  . prochlorperazine (COMPAZINE) tablet 10 mg  10 mg Oral Q6H PRN Clenton Pare R, NP      . rosuvastatin (CRESTOR) tablet 5 mg  5 mg Oral QPM Curcio, Kristin R, NP   5 mg at 05/28/20 1744  . senna-docusate (Senokot-S) tablet 2 tablet  2 tablet Oral BID Jaci Standard, MD   2 tablet at 05/29/20 1038  . sodium bicarbonate/sodium chloride mouthwash   Mouth Rinse  PRN Curcio, Kristin R, NP      . sodium chloride flush (NS) 0.9 % injection 10-40 mL  10-40 mL Intracatheter Q12H Myrtis Ser, NP   10 mL at 05/28/20 2129  . sodium chloride flush (NS) 0.9 % injection 10-40 mL  10-40 mL Intracatheter PRN Curcio, Reita May,  NP      . sucralfate (CARAFATE) 1 GM/10ML suspension 1 g  1 g Oral TID WC & HS Maryanna Shape, NP   1 g at 05/29/20 3491       Family History  Problem Relation Age of Onset  . Liver cancer Sister   . Heart failure Brother 44       CABGx4  . Heart attack Mother 8  . Uterine cancer Mother   . Heart attack Father 36  . Asthma Son   . Diabetes Brother   . Prostate cancer Brother   Relation Age of Onset  . Liver cancer Sister   . Heart failure Brother 44       CABGx4  . Heart attack Mother 89  . Uterine cancer Mother   . Heart attack Father 40  . Asthma Son   . Diabetes Brother   . Prostate cancer Brother      Socioeconomic History  . Marital status: Married    Spouse name: [name]  . Number of children: 2  . Years of education: 27  . Highest education level: Not on file  Occupational History  . Occupation: Systems developer: [employer]  Tobacco Use  . Smoking status: Never Smoker  . Smokeless tobacco: Never Used  Vaping Use  . Vaping Use: Never used  Substance and Sexual Activity  . Alcohol use: No  . Drug use: No  . Sexual activity: Not on file  Other Topics Concern  . Not on file  Social History Narrative   Married to [employer] at [employer].   Son, [name] married   Son, [name], Married.    Caffeine use: 1 cup coffee per day   Social Determinants of Health   Financial Resource Strain:   . Difficulty of Paying Living Expenses: Not on file  Food Insecurity:   . Worried About Charity fundraiser in the Last Year: Not on file  . Ran Out of Food in the Last Year: Not on file  Transportation Needs:   . Lack of Transportation (Medical): Not on file  . Lack of  Transportation (Non-Medical): Not on file  Physical Activity:   . Days of Exercise per Week: Not on file  . Minutes of Exercise per Session: Not on file  Stress:   . Feeling of Stress : Not on file  Social Connections:   . Frequency of Communication with Friends and Family: Not on file  . Frequency of Social Gatherings with Friends and Family: Not on file  . Attends Religious Services: Not on file  . Active Member of Clubs or Organizations: Not on file  . Attends Archivist Meetings: Not on file  . Marital Status: Not on file  Intimate Partner Violence:   . Fear of Current or Ex-Partner: Not on file  . Emotionally Abused: Not on file  . Physically Abused: Not on file  . Sexually Abused: Not on file  :  Review of Systems: A comprehensive 14 point review of systems was negative except as noted in the HPI.  Exam: Patient Vitals for the past 24 hrs:  BP Temp Temp src Pulse Resp SpO2  05/29/20 0600 107/60 98 F (36.7 C) Oral (!) 56 20 97 %  05/28/20 2222 125/68 (!) 97.4 F (36.3 C) Oral 60 20 98 %  05/28/20 1503 113/75 (!) 97.4 F (36.3 C) Oral (!) 53 18 96 %  General:  Elderly Caucasian male. well-nourished in no acute distress.   Eyes:  no scleral icterus.   ENT:  There were no oropharyngeal lesions.   Neck was without thyromegaly.   Lymphatics:  Negative cervical, supraclavicular or axillary adenopathy.   Respiratory: lungs were clear bilaterally without wheezing or crackles.   Cardiovascular:  Regular rate and rhythm, S1/S2, without murmur, rub or gallop.  There was no pedal edema.   GI:  Positive bowel sounds.  Left upper quadrant mass not palpable. Musculoskeletal:  no spinal tenderness of palpation of vertebral spine.   Skin exam was without echymosis, petichae.   Neuro exam was nonfocal. Patient was alert and oriented.  Attention was good.   Language was appropriate.  Mood was normal without depression.  Speech was not pressured.  Thought content was not  tangential.     Lab Results  Component Value Date   WBC 17.9 (H) 05/29/2020   HGB 9.8 (L) 05/29/2020   HCT 30.3 (L) 05/29/2020   PLT 351 05/29/2020   GLUCOSE 141 (H) 05/29/2020   CHOL  12/31/2009    160        ATP III CLASSIFICATION:  <200     mg/dL   Desirable  200-239  mg/dL   Borderline High  >=240    mg/dL   High          TRIG 213 (H) 12/31/2009   HDL 28 (L) 12/31/2009   LDLCALC  12/31/2009    89        Total Cholesterol/HDL:CHD Risk Coronary Heart Disease Risk Table                     Men   Women  1/2 Average Risk   3.4   3.3  Average Risk       5.0   4.4  2 X Average Risk   9.6   7.1  3 X Average Risk  23.4   11.0        Use the calculated Patient Ratio above and the CHD Risk Table to determine the patient's CHD Risk.        ATP III CLASSIFICATION (LDL):  <100     mg/dL   Optimal  100-129  mg/dL   Near or Above                    Optimal  130-159  mg/dL   Borderline  160-189  mg/dL   High  >190     mg/dL   Very High   ALT 34 05/29/2020   AST 29 05/29/2020   NA 139 05/29/2020   K 4.0 05/29/2020   CL 109 05/29/2020   CREATININE 1.09 05/29/2020   BUN 27 (H) 05/29/2020   CO2 24 05/29/2020    CT Biopsy  Result Date: 05/08/2020 CLINICAL DATA:  13 cm left-sided mesenteric abdominal mass. EXAM: CT GUIDED CORE BIOPSY OF MESENTERIC MASS ANESTHESIA/SEDATION: 1.5 mg IV Versed; 75 mcg IV Fentanyl Total Moderate Sedation Time:  18 minutes. The patient's level of consciousness and physiologic status were continuously monitored during the procedure by Radiology nursing. PROCEDURE: The procedure risks, benefits, and alternatives were explained to the patient. Questions regarding the procedure were encouraged and answered. The patient understands and consents to the procedure. A time-out was performed prior to initiating the procedure. CT was performed through the abdomen in a supine position. The left abdominal wall was prepped with chlorhexidine in a sterile fashion, and a  sterile drape was applied covering the operative field. A sterile gown and sterile gloves were used for the procedure. Local anesthesia was provided with 1% Lidocaine. Under CT guidance, a 17 gauge trocar needle was advanced to the level of a left-sided mesenteric mass. After confirming needle tip position, 4 separate coaxial 18 gauge core biopsy samples were obtained and submitted in saline. Some Gel-Foam pledgets were advanced through the outer needle as the needle was retracted and removed. COMPLICATIONS: None FINDINGS: Large left-sided intra-abdominal mass within the mesentery measures up to 13.3 cm in maximum diameter. Solid tissue was obtained. IMPRESSION: CT-guided core biopsy performed of large left-sided mesenteric mass measuring just over 13 cm in estimated maximal diameter. Electronically Signed   By: Irish Lack M.D.   On: 05/08/2020 15:02   ECHOCARDIOGRAM COMPLETE  Result Date: 05/22/2020    ECHOCARDIOGRAM REPORT   Patient Name:   MCLANE ARORA [facility] Date of Exam: 05/22/2020 Medical Rec #:  [id number]        Height:       68.0 in Accession #:    [id number]       Weight:       171.0 lb Date of Birth:  1945-09-27        BSA:          1.912 m Patient Age:    73 years         BP:           116/77 mmHg Patient Gender: M                HR:           80 bpm. Exam Location:  Outpatient Procedure: 2D Echo, Cardiac Doppler and Color Doppler Indications:    Chemotherapy evaluation v87.41 / v58.11  History:        Patient has no prior history of Echocardiogram examinations. TIA                 and Stroke; Risk Factors:Hypertension, Dyslipidemia and GERD.  Sonographer:    Eulah Pont RDCS Referring Phys: [id number] Jackquline Denmark Maziyah Vessel IV IMPRESSIONS  1. Left ventricular ejection fraction, by estimation, is 60 to 65%. The left ventricle has normal function. The left ventricle has no regional wall motion abnormalities. Left ventricular diastolic parameters are consistent with Grade I diastolic dysfunction (impaired  relaxation). The average left ventricular global longitudinal strain is -17.6 %. The global longitudinal strain is normal.  2. Right ventricular systolic function is normal. The right ventricular size is normal.  3. The mitral valve is normal in structure. No evidence of mitral valve regurgitation. No evidence of mitral stenosis.  4. The aortic valve is normal in structure. Aortic valve regurgitation is not visualized. No aortic stenosis is present.  5. The inferior vena cava is normal in size with greater than 50% respiratory variability, suggesting right atrial pressure of 3 mmHg. FINDINGS  Left Ventricle: Left ventricular ejection fraction, by estimation, is 60 to 65%. The left ventricle has normal function. The left ventricle has no regional wall motion abnormalities. The average left ventricular global longitudinal strain is -17.6 %. The global longitudinal strain is normal. The left ventricular internal cavity size was normal in size. There is no left ventricular hypertrophy. Left ventricular diastolic parameters are consistent with Grade I diastolic dysfunction (impaired relaxation). Right Ventricle: The right ventricular size is normal. No increase in right ventricular wall thickness. Right ventricular systolic function is normal. Left Atrium: Left atrial size was normal in  size. Right Atrium: Right atrial size was normal in size. Pericardium: There is no evidence of pericardial effusion. Mitral Valve: The mitral valve is normal in structure. Normal mobility of the mitral valve leaflets. No evidence of mitral valve regurgitation. No evidence of mitral valve stenosis. Tricuspid Valve: The tricuspid valve is normal in structure. Tricuspid valve regurgitation is not demonstrated. No evidence of tricuspid stenosis. Aortic Valve: The aortic valve is normal in structure. Aortic valve regurgitation is not visualized. No aortic stenosis is present. Pulmonic Valve: The pulmonic valve was normal in structure. Pulmonic  valve regurgitation is trivial. No evidence of pulmonic stenosis. Aorta: The aortic root is normal in size and structure. Venous: The inferior vena cava is normal in size with greater than 50% respiratory variability, suggesting right atrial pressure of 3 mmHg. IAS/Shunts: No atrial level shunt detected by color flow Doppler.  LEFT VENTRICLE PLAX 2D LVIDd:         5.10 cm  Diastology LVIDs:         3.20 cm  LV e' lateral:   9.94 cm/s LV PW:         0.70 cm  LV E/e' lateral: 4.6 LV IVS:        0.70 cm  LV e' medial:    6.85 cm/s LVOT diam:     2.10 cm  LV E/e' medial:  6.6 LV SV:         81 LV SV Index:   43       2D Longitudinal Strain LVOT Area:     3.46 cm 2D Strain GLS Avg:     -17.6 %  RIGHT VENTRICLE RV S prime:     9.79 cm/s TAPSE (M-mode): 1.6 cm LEFT ATRIUM             Index       RIGHT ATRIUM           Index LA diam:        3.70 cm 1.94 cm/m  RA Area:     14.40 cm LA Vol (A2C):   69.5 ml 36.35 ml/m RA Volume:   31.90 ml  16.68 ml/m LA Vol (A4C):   51.0 ml 26.67 ml/m LA Biplane Vol: 59.4 ml 31.07 ml/m  AORTIC VALVE LVOT Vmax:   143.00 cm/s LVOT Vmean:  98.900 cm/s LVOT VTI:    0.235 m  AORTA Ao Root diam: 3.20 cm Ao Asc diam:  3.40 cm MITRAL VALVE MV Area (PHT): 2.22 cm    SHUNTS MV Decel Time: 342 msec    Systemic VTI:  0.24 m MV E velocity: 45.30 cm/s  Systemic Diam: 2.10 cm MV A velocity: 72.50 cm/s MV E/A ratio:  0.62 Candee Furbish MD Electronically signed by Candee Furbish MD Signature Date/Time: 05/22/2020/12:55:40 PM    Final    IR IMAGING GUIDED PORT INSERTION  Result Date: 05/19/2020 INDICATION: History of diffuse large B-cell lymphoma. In need of durable intravenous access for chemotherapy administration EXAM: IMPLANTED PORT A CATH PLACEMENT WITH ULTRASOUND AND FLUOROSCOPIC GUIDANCE COMPARISON:  PET-CT-04/28/2020 MEDICATIONS: Ancef 2 gm IV; The antibiotic was administered within an appropriate time interval prior to skin puncture. ANESTHESIA/SEDATION: Moderate (conscious) sedation was employed  during this procedure. A total of Versed 2 mg and Fentanyl 100 mcg was administered intravenously. Moderate Sedation Time: 25 minutes. The patient's level of consciousness and vital signs were monitored continuously by radiology nursing throughout the procedure under my direct supervision. CONTRAST:  None FLUOROSCOPY TIME:  24 seconds (5 mGy)  COMPLICATIONS: None immediate. PROCEDURE: The procedure, risks, benefits, and alternatives were explained to the patient. Questions regarding the procedure were encouraged and answered. The patient understands and consents to the procedure. The right neck and chest were prepped with chlorhexidine in a sterile fashion, and a sterile drape was applied covering the operative field. Maximum barrier sterile technique with sterile gowns and gloves were used for the procedure. A timeout was performed prior to the initiation of the procedure. Local anesthesia was provided with 1% lidocaine with epinephrine. After creating a small venotomy incision, a micropuncture kit was utilized to access the internal jugular vein. Real-time ultrasound guidance was utilized for vascular access including the acquisition of a permanent ultrasound image documenting patency of the accessed vessel. The microwire was utilized to measure appropriate catheter length. A subcutaneous port pocket was then created along the upper chest wall utilizing a combination of sharp and blunt dissection. The pocket was irrigated with sterile saline. A single lumen "Slim" sized power injectable port was chosen for placement. The 8 Fr catheter was tunneled from the port pocket site to the venotomy incision. The port was placed in the pocket. The external catheter was trimmed to appropriate length. At the venotomy, an 8 Fr peel-away sheath was placed over a guidewire under fluoroscopic guidance. The catheter was then placed through the sheath and the sheath was removed. Final catheter positioning was confirmed and documented  with a fluoroscopic spot radiograph. The port was accessed with a Huber needle, aspirated and flushed with heparinized saline. The venotomy site was closed with an interrupted 4-0 Vicryl suture. The port pocket incision was closed with interrupted 2-0 Vicryl suture. The skin was opposed with a running subcuticular 4-0 Vicryl suture. Dermabond and Steri-strips were applied to both incisions. Dressings were applied. The patient tolerated the procedure well without immediate post procedural complication. FINDINGS: After catheter placement, the tip lies within the superior cavoatrial junction. The catheter aspirates and flushes normally and is ready for immediate use. IMPRESSION: Successful placement of a right internal jugular approach power injectable Port-A-Cath. The catheter is ready for immediate use. Electronically Signed   By: Sandi Mariscal M.D.   On: 05/19/2020 15:59     CT Biopsy  Result Date: 05/08/2020 CLINICAL DATA:  13 cm left-sided mesenteric abdominal mass. EXAM: CT GUIDED CORE BIOPSY OF MESENTERIC MASS ANESTHESIA/SEDATION: 1.5 mg IV Versed; 75 mcg IV Fentanyl Total Moderate Sedation Time:  18 minutes. The patient's level of consciousness and physiologic status were continuously monitored during the procedure by Radiology nursing. PROCEDURE: The procedure risks, benefits, and alternatives were explained to the patient. Questions regarding the procedure were encouraged and answered. The patient understands and consents to the procedure. A time-out was performed prior to initiating the procedure. CT was performed through the abdomen in a supine position. The left abdominal wall was prepped with chlorhexidine in a sterile fashion, and a sterile drape was applied covering the operative field. A sterile gown and sterile gloves were used for the procedure. Local anesthesia was provided with 1% Lidocaine. Under CT guidance, a 17 gauge trocar needle was advanced to the level of a left-sided mesenteric mass.  After confirming needle tip position, 4 separate coaxial 18 gauge core biopsy samples were obtained and submitted in saline. Some Gel-Foam pledgets were advanced through the outer needle as the needle was retracted and removed. COMPLICATIONS: None FINDINGS: Large left-sided intra-abdominal mass within the mesentery measures up to 13.3 cm in maximum diameter. Solid tissue was obtained. IMPRESSION: CT-guided core biopsy  performed of large left-sided mesenteric mass measuring just over 13 cm in estimated maximal diameter. Electronically Signed   By: Aletta Edouard M.D.   On: 05/08/2020 15:02   ECHOCARDIOGRAM COMPLETE  Result Date: 05/22/2020    ECHOCARDIOGRAM REPORT   Patient Name:   ZIQUAN FIDEL [facility] Date of Exam: 05/22/2020 Medical Rec #:  [id number]        Height:       68.0 in Accession #:    [id number]       Weight:       171.0 lb Date of Birth:  [birth year]        BSA:          1.912 m Patient Age:    74 years         BP:           116/77 mmHg Patient Gender: M                HR:           80 bpm. Exam Location:  Outpatient Procedure: 2D Echo, Cardiac Doppler and Color Doppler Indications:    Chemotherapy evaluation v87.41 / v58.11  History:        Patient has no prior history of Echocardiogram examinations. TIA                 and Stroke; Risk Factors:Hypertension, Dyslipidemia and GERD.  Sonographer:    Bernadene Person RDCS Referring Phys: [id number] [place]  1. Left ventricular ejection fraction, by estimation, is 60 to 65%. The left ventricle has normal function. The left ventricle has no regional wall motion abnormalities. Left ventricular diastolic parameters are consistent with Grade I diastolic dysfunction (impaired relaxation). The average left ventricular global longitudinal strain is -17.6 %. The global longitudinal strain is normal.  2. Right ventricular systolic function is normal. The right ventricular size is normal.  3. The mitral valve is normal in structure. No evidence of mitral  valve regurgitation. No evidence of mitral stenosis.  4. The aortic valve is normal in structure. Aortic valve regurgitation is not visualized. No aortic stenosis is present.  5. The inferior vena cava is normal in size with greater than 50% respiratory variability, suggesting right atrial pressure of 3 mmHg. FINDINGS  Left Ventricle: Left ventricular ejection fraction, by estimation, is 60 to 65%. The left ventricle has normal function. The left ventricle has no regional wall motion abnormalities. The average left ventricular global longitudinal strain is -17.6 %. The global longitudinal strain is normal. The left ventricular internal cavity size was normal in size. There is no left ventricular hypertrophy. Left ventricular diastolic parameters are consistent with Grade I diastolic dysfunction (impaired relaxation). Right Ventricle: The right ventricular size is normal. No increase in right ventricular wall thickness. Right ventricular systolic function is normal. Left Atrium: Left atrial size was normal in size. Right Atrium: Right atrial size was normal in size. Pericardium: There is no evidence of pericardial effusion. Mitral Valve: The mitral valve is normal in structure. Normal mobility of the mitral valve leaflets. No evidence of mitral valve regurgitation. No evidence of mitral valve stenosis. Tricuspid Valve: The tricuspid valve is normal in structure. Tricuspid valve regurgitation is not demonstrated. No evidence of tricuspid stenosis. Aortic Valve: The aortic valve is normal in structure. Aortic valve regurgitation is not visualized. No aortic stenosis is present. Pulmonic Valve: The pulmonic valve was normal in structure. Pulmonic valve regurgitation is trivial. No evidence of pulmonic stenosis.  Aorta: The aortic root is normal in size and structure. Venous: The inferior vena cava is normal in size with greater than 50% respiratory variability, suggesting right atrial pressure of 3 mmHg. IAS/Shunts: No  atrial level shunt detected by color flow Doppler.  LEFT VENTRICLE PLAX 2D LVIDd:         5.10 cm  Diastology LVIDs:         3.20 cm  LV e' lateral:   9.94 cm/s LV PW:         0.70 cm  LV E/e' lateral: 4.6 LV IVS:        0.70 cm  LV e' medial:    6.85 cm/s LVOT diam:     2.10 cm  LV E/e' medial:  6.6 LV SV:         81 LV SV Index:   43       2D Longitudinal Strain LVOT Area:     3.46 cm 2D Strain GLS Avg:     -17.6 %  RIGHT VENTRICLE RV S prime:     9.79 cm/s TAPSE (M-mode): 1.6 cm LEFT ATRIUM             Index       RIGHT ATRIUM           Index LA diam:        3.70 cm 1.94 cm/m  RA Area:     14.40 cm LA Vol (A2C):   69.5 ml 36.35 ml/m RA Volume:   31.90 ml  16.68 ml/m LA Vol (A4C):   51.0 ml 26.67 ml/m LA Biplane Vol: 59.4 ml 31.07 ml/m  AORTIC VALVE LVOT Vmax:   143.00 cm/s LVOT Vmean:  98.900 cm/s LVOT VTI:    0.235 m  AORTA Ao Root diam: 3.20 cm Ao Asc diam:  3.40 cm MITRAL VALVE MV Area (PHT): 2.22 cm    SHUNTS MV Decel Time: 342 msec    Systemic VTI:  0.24 m MV E velocity: 45.30 cm/s  Systemic Diam: 2.10 cm MV A velocity: 72.50 cm/s MV E/A ratio:  0.62 Donato Schultz MD Electronically signed by Donato Schultz MD Signature Date/Time: 05/22/2020/12:55:40 PM    Final    IR IMAGING GUIDED PORT INSERTION  Result Date: 05/19/2020 INDICATION: History of diffuse large B-cell lymphoma. In need of durable intravenous access for chemotherapy administration EXAM: IMPLANTED PORT A CATH PLACEMENT WITH ULTRASOUND AND FLUOROSCOPIC GUIDANCE COMPARISON:  PET-CT-04/28/2020 MEDICATIONS: Ancef 2 gm IV; The antibiotic was administered within an appropriate time interval prior to skin puncture. ANESTHESIA/SEDATION: Moderate (conscious) sedation was employed during this procedure. A total of Versed 2 mg and Fentanyl 100 mcg was administered intravenously. Moderate Sedation Time: 25 minutes. The patient's level of consciousness and vital signs were monitored continuously by radiology nursing throughout the procedure under my  direct supervision. CONTRAST:  None FLUOROSCOPY TIME:  24 seconds (5 mGy) COMPLICATIONS: None immediate. PROCEDURE: The procedure, risks, benefits, and alternatives were explained to the patient. Questions regarding the procedure were encouraged and answered. The patient understands and consents to the procedure. The right neck and chest were prepped with chlorhexidine in a sterile fashion, and a sterile drape was applied covering the operative field. Maximum barrier sterile technique with sterile gowns and gloves were used for the procedure. A timeout was performed prior to the initiation of the procedure. Local anesthesia was provided with 1% lidocaine with epinephrine. After creating a small venotomy incision, a micropuncture kit was utilized to access the internal jugular vein.  Real-time ultrasound guidance was utilized for vascular access including the acquisition of a permanent ultrasound image documenting patency of the accessed vessel. The microwire was utilized to measure appropriate catheter length. A subcutaneous port pocket was then created along the upper chest wall utilizing a combination of sharp and blunt dissection. The pocket was irrigated with sterile saline. A single lumen "Slim" sized power injectable port was chosen for placement. The 8 Fr catheter was tunneled from the port pocket site to the venotomy incision. The port was placed in the pocket. The external catheter was trimmed to appropriate length. At the venotomy, an 8 Fr peel-away sheath was placed over a guidewire under fluoroscopic guidance. The catheter was then placed through the sheath and the sheath was removed. Final catheter positioning was confirmed and documented with a fluoroscopic spot radiograph. The port was accessed with a Huber needle, aspirated and flushed with heparinized saline. The venotomy site was closed with an interrupted 4-0 Vicryl suture. The port pocket incision was closed with interrupted 2-0 Vicryl suture. The  skin was opposed with a running subcuticular 4-0 Vicryl suture. Dermabond and Steri-strips were applied to both incisions. Dressings were applied. The patient tolerated the procedure well without immediate post procedural complication. FINDINGS: After catheter placement, the tip lies within the superior cavoatrial junction. The catheter aspirates and flushes normally and is ready for immediate use. IMPRESSION: Successful placement of a right internal jugular approach power injectable Port-A-Cath. The catheter is ready for immediate use. Electronically Signed   By: Sandi Mariscal M.D.   On: 05/19/2020 15:59    Assessment and Plan:  SHAHEEM PICHON 74 y.o. male with medical history significant for Stage I DLBCL who presents for a follow up visit.  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  He is currently receiving cycle #2 of EPOCH-R starting on 06/16/2020.  Today he is Cycle 2 Day 3 of treatment.  He is tolerating his chemotherapy well so far with no nausea or vomiting.  Bowels are moving (stools remain firm and small, but are occurring daily) and he continues to have a good appetite.  There are no barriers to proceeding with treatment.      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 Stage I bulky disease.   --Continue EPOCH-R. Today is Cycle 2 Day 3 --Adverse effects have been reviewed including, but not limited to, alopecia, myelosuppression, peripheral neuropathy, mucositis, liver/renal dysfunction, and possible infusion reaction associated with Rituxan. He agrees to proceed. --PAC in place and ready for use. --Echocardiogram from  05/22/2020 shows LVEF of 60-65%  --CBC adequate to proceed with treatment today.  Leukocytosis likely related to steroids.  Hgb continues trending downward this morning.  CMET, LDH, uric acid are adequate to proceed with treatment today.  --Will monitor closely with daily CBC with diff, CMET, LDH, and uric acid --continue Allopurinol 300 mg daily.  --Lovenox for DVT prophylaxis. --Continue home medications for HTN and hyperlipidemia.  --Continue PRN antiemetics. --Continue Senokot BID with the addition of PRN miralax.   The patient will return to the cancer on 06/23/2020 for Rituxan and Neulasta and will have labs and a follow-up visit on 06/25/2020.  [name], MD Department of Hematology/Oncology [place] [facility] Phone: [phone] Pager: [phone] Email: [email or web address]  06/18/20 10:00 AM

## 2020-06-19 LAB — CBC WITH DIFFERENTIAL/PLATELET
Abs Immature Granulocytes: 0.19 10*3/uL — ABNORMAL HIGH (ref 0.00–0.07)
Basophils Absolute: 0 10*3/uL (ref 0.0–0.1)
Basophils Relative: 0 %
Eosinophils Absolute: 0 10*3/uL (ref 0.0–0.5)
Eosinophils Relative: 0 %
HCT: 27 % — ABNORMAL LOW (ref 39.0–52.0)
Hemoglobin: 8.6 g/dL — ABNORMAL LOW (ref 13.0–17.0)
Immature Granulocytes: 1 %
Lymphocytes Relative: 2 %
Lymphs Abs: 0.4 10*3/uL — ABNORMAL LOW (ref 0.7–4.0)
MCH: 29 pg (ref 26.0–34.0)
MCHC: 31.9 g/dL (ref 30.0–36.0)
MCV: 90.9 fL (ref 80.0–100.0)
Monocytes Absolute: 0.6 10*3/uL (ref 0.1–1.0)
Monocytes Relative: 3 %
Neutro Abs: 17.6 10*3/uL — ABNORMAL HIGH (ref 1.7–7.7)
Neutrophils Relative %: 94 %
Platelets: 348 10*3/uL (ref 150–400)
RBC: 2.97 MIL/uL — ABNORMAL LOW (ref 4.22–5.81)
RDW: 16.8 % — ABNORMAL HIGH (ref 11.5–15.5)
WBC: 18.8 10*3/uL — ABNORMAL HIGH (ref 4.0–10.5)
nRBC: 0 % (ref 0.0–0.2)

## 2020-06-19 LAB — COMPREHENSIVE METABOLIC PANEL
ALT: 49 U/L — ABNORMAL HIGH (ref 0–44)
AST: 26 U/L (ref 15–41)
Albumin: 2.7 g/dL — ABNORMAL LOW (ref 3.5–5.0)
Alkaline Phosphatase: 53 U/L (ref 38–126)
Anion gap: 8 (ref 5–15)
BUN: 31 mg/dL — ABNORMAL HIGH (ref 8–23)
CO2: 25 mmol/L (ref 22–32)
Calcium: 8.3 mg/dL — ABNORMAL LOW (ref 8.9–10.3)
Chloride: 108 mmol/L (ref 98–111)
Creatinine, Ser: 1.06 mg/dL (ref 0.61–1.24)
GFR calc Af Amer: >60 mL/min (ref >60)
GFR calc non Af Amer: >60 mL/min (ref >60)
Glucose, Bld: 149 mg/dL — ABNORMAL HIGH (ref 70–99)
Potassium: 3.8 mmol/L (ref 3.5–5.1)
Sodium: 141 mmol/L (ref 135–145)
Total Bilirubin: 0.6 mg/dL (ref 0.3–1.2)
Total Protein: 4.9 g/dL — ABNORMAL LOW (ref 6.5–8.1)

## 2020-06-19 LAB — LACTATE DEHYDROGENASE: LDH: 125 U/L (ref 98–192)

## 2020-06-19 LAB — URIC ACID: Uric Acid, Serum: 3.6 mg/dL — ABNORMAL LOW (ref 3.7–8.6)

## 2020-06-19 MED ORDER — SODIUM CHLORIDE 0.9 % IV SOLN
Freq: Once | INTRAVENOUS | Status: AC
  Administered 2020-06-19: 8 mg via INTRAVENOUS
  Filled 2020-06-19: qty 4

## 2020-06-19 MED ORDER — VINCRISTINE SULFATE CHEMO INJECTION 1 MG/ML
Freq: Once | INTRAVENOUS | Status: AC
  Filled 2020-06-19: qty 10

## 2020-06-19 NOTE — Progress Notes (Signed)
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   1) 04/17/2020: CT A/P showed dominant nodal mass(11.5 x 10.9 cm)within the jejunal mesentery, highly suspicious for lymphoma. Additionally there are left lower lobe pleural-based pulmonary nodules 2) 04/24/2020: establish care with Dr. Lorenso Courier 3) 04/28/2020:  PET CT scan shows Large solid left mesenteric mass 13.0 cm with maximum SUV of 21.2, [place] 5 with local hypermetabolic porta hepatis and retroperitoneal lymph nodes. Constitutes bulky Stage I disease.   Reason for Admission: double hit lymphoma, Cycle #2 EPOCH-R  HPI: Connor Adams is a 74 year old male with medical history significant for double hit DLBCL. He presents for Cycle 2 of R-EPOCH chemotherapy. Risks and benefits have been discussed and he has agreed to proceed  Interval History: --BM last night which was softer after addition of MiraLAX --appetite is excellent --energy is good, patient is ambulating up and down the hallway.  --no f/c/s nausea, vomiting, or diarrhea.    Diagnosis Date  . Arthritis   . Chronic kidney disease (CKD), stage III (moderate)   . COVID-19   . GERD (gastroesophageal reflux disease)    occ  . Hemorrhoids   . History of ETT 3/05   low risk  . History of hiatal hernia   . History of kidney stones   . HLD (hyperlipidemia)   . HTN (hypertension)   . Left inguinal hernia   . Melanoma ([place])    excied  . 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 Stroke [facility]) 2011   tia   . TIA (transient ischemic attack) 4/11   associated w slurred speecha ndmild facial droop. lasted for about 10 minutes. Had MRI, etc with [facility] that per his report was ok (dr. Stephannie Li). Echo (4/11): EF 55-60%, no  regional WMAs, normal diastolic function, mild LAE, no source of embolus  Past Surgical History:  Procedure Laterality Date  . BMI 30.2 muscular  12/08/04  . curretage actinic keratosis R ear  01/20/05  . ETT low prob of CAD  11/21/03  . excision melanoma in situ back  01/20/05  . hemorrhoids sclerosed at colonoscopy  12/19/05  . IR IMAGING GUIDED PORT INSERTION  05/19/2020  . LAPAROSCOPIC INGUINAL HERNIA REPAIR Left 12/20/1995  . LUMBAR DISC SURGERY    . NERVE REPAIR     back of neck  . retinal laser surgery Left 09/20/1986  . SEPTOPLASTY  11/19/98  . TOTAL KNEE ARTHROPLASTY Left 12/13/2016   Procedure: TOTAL KNEE ARTHROPLASTY WITH RIGHT KNEE CORTISONE INJECTION;  Surgeon: Frederik Pear, MD;  Location: [place];  Service: Orthopedics;  Laterality: Left;  . uric acid 6.9  12/22/04  . x-ray l great toe  07/21/00       Fre Past Surgical History:  Procedure Laterality Date  . BMI 30.2 muscular  12/08/04  . curretage actinic keratosis R ear  01/20/05  . ETT low prob of CAD  11/21/03  . excision melanoma in situ back  01/20/05  . hemorrhoids sclerosed at colonoscopy  12/19/05  . IR IMAGING GUIDED PORT INSERTION  05/19/2020  . LAPAROSCOPIC INGUINAL HERNIA REPAIR Left 12/20/1995  . LUMBAR DISC SURGERY    . NERVE REPAIR     back of neck  . retinal laser surgery Left 09/20/1986  . SEPTOPLASTY  11/19/98  . TOTAL KNEE ARTHROPLASTY Left 12/13/2016   Procedure: TOTAL KNEE ARTHROPLASTY WITH RIGHT KNEE CORTISONE INJECTION;  Surgeon: Frederik Pear, MD;  Location: [place];  Service: Orthopedics;  Laterality: Left;  . uric acid 6.9  12/22/04  . x-ray l great toe  07/21/00  quency Provider Last Rate Last Admin  . 0.9 %  sodium chloride infusion   Intravenous Continuous [name] IV, MD 20 mL/hr at 05/29/20 0400 Rate Verify at 05/29/20 0400  . allopurinol (ZYLOPRIM) tablet 300 mg  300 mg Oral Daily [name] R, NP   300 mg at 05/29/20 1038  . aspirin tablet 325 mg  325 mg Oral Q4H PRN Maryanna Shape, NP      .  Chlorhexidine Gluconate Cloth 2 % PADS 6 each  6 each Topical Daily Maryanna Shape, NP   6 each at 05/29/20 1038  . Cold Pack 1 packet  1 packet Topical Once PRN [name] IV, MD      . DOXOrubicin (ADRIAMYCIN) 20 mg, etoposide (VEPESID) 96 mg, vinCRIStine (ONCOVIN) 0.8 mg in sodium chloride 0.9 % 1,000 mL chemo infusion   Intravenous Once Orson Slick, MD 51 mL/hr at 05/28/20 1422 New Bag at 05/28/20 1422  . DOXOrubicin (ADRIAMYCIN) 20 mg, etoposide (VEPESID) 96 mg, vinCRIStine (ONCOVIN) 0.8 mg in sodium chloride 0.9 % 1,000 mL chemo infusion   Intravenous Once Narda Rutherford T IV, MD      . enoxaparin (LOVENOX) injection 40 mg  40 mg Subcutaneous Q24H [name] R, NP   40 mg at 05/28/20 1116  . Hot Pack 1 packet  1 packet Topical Once PRN [name] IV, MD      . hydrocortisone (ANUSOL-HC) 2.5 % rectal cream 1 application  1 application Rectal Daily PRN Maryanna Shape, NP      . losartan (COZAAR) tablet 25 mg  25 mg Oral Daily [name] R, NP   25 mg at 05/29/20 1038  . multivitamin with minerals tablet 1 tablet  1 tablet Oral Daily Maryanna Shape, NP   1 tablet at 05/29/20 1038  . ondansetron (ZOFRAN-ODT) disintegrating tablet 8 mg  8 mg Oral Q8H PRN Zolton Dowson R, NP      . pantoprazole (PROTONIX) EC tablet 40 mg  40 mg Oral BID [name] R, NP   40 mg at 05/29/20 1038  . polyethylene glycol (MIRALAX / GLYCOLAX) packet 17 g  17 g Oral Daily PRN Narda Rutherford T IV, MD      . predniSONE (DELTASONE) tablet 120 mg  120 mg Oral Q breakfast [name] IV, MD   120 mg at 05/29/20 0819  . prochlorperazine (COMPAZINE) tablet 10 mg  10 mg Oral Q6H PRN [name] R, NP      . rosuvastatin (CRESTOR) tablet 5 mg  5 mg Oral QPM Laiyah Exline R, NP   5 mg at 05/28/20 1744  . senna-docusate (Senokot-S) tablet 2 tablet  2 tablet Oral BID Orson Slick, MD   2 tablet at 05/29/20 1038  . sodium bicarbonate/sodium chloride mouthwash 1039m   Mouth Rinse PRN  Monicka Cyran R, NP      . sodium chloride flush (NS) 0.9 % injection 10-40 mL  10-40 mL Intracatheter Q12H CMaryanna Shape NP   10 mL at 05/28/20 2129  . sodium chloride flush (NS) 0.9 % injection 10-40 mL  10-40 mL Intracatheter PRN CMaryanna Shape NP      .  sucralfate (CARAFATE) 1 GM/10ML suspension 1 g  1 g Oral TID WC & HS Maryanna Shape, NP   1 g at 05/29/20 5732       Family History  Problem Relation Age of Onset  . Liver cancer Sister   . Heart failure Brother 44       CABGx4  . Heart attack Mother 11  . Uterine cancer Mother   . Heart attack Father 48  . Asthma Son   . Diabetes Brother   . Prostate cancer Brother   Relation Age of Onset  . Liver cancer Sister   . Heart failure Brother 44       CABGx4  . Heart attack Mother 32  . Uterine cancer Mother   . Heart attack Father 58  . Asthma Son   . Diabetes Brother   . Prostate cancer Brother      Socioeconomic History  . Marital status: Married    Spouse name: [name]  . Number of children: 2  . Years of education: 65  . Highest education level: Not on file  Occupational History  . Occupation: Systems developer: [employer]  Tobacco Use  . Smoking status: Never Smoker  . Smokeless tobacco: Never Used  Vaping Use  . Vaping Use: Never used  Substance and Sexual Activity  . Alcohol use: No  . Drug use: No  . Sexual activity: Not on file  Other Topics Concern  . Not on file  Social History Narrative   Married to [employer] at [employer].   Son, [name] married   Son, [name], Married.    Caffeine use: 1 cup coffee per day   Social Determinants of Health   Financial Resource Strain:   . Difficulty of Paying Living Expenses: Not on file  Food Insecurity:   . Worried About Charity fundraiser in the Last Year: Not on file  . Ran Out of Food in the Last Year: Not on file  Transportation Needs:   . Lack of Transportation (Medical): Not on file  . Lack of  Transportation (Non-Medical): Not on file  Physical Activity:   . Days of Exercise per Week: Not on file  . Minutes of Exercise per Session: Not on file  Stress:   . Feeling of Stress : Not on file  Social Connections:   . Frequency of Communication with Friends and Family: Not on file  . Frequency of Social Gatherings with Friends and Family: Not on file  . Attends Religious Services: Not on file  . Active Member of Clubs or Organizations: Not on file  . Attends Archivist Meetings: Not on file  . Marital Status: Not on file  Intimate Partner Violence:   . Fear of Current or Ex-Partner: Not on file  . Emotionally Abused: Not on file  . Physically Abused: Not on file  . Sexually Abused: Not on file  :  Review of Systems: A comprehensive 14 point review of systems was negative except as noted in the HPI.  Exam: Patient Vitals for the past 24 hrs:  BP Temp Temp src Pulse Resp SpO2  05/29/20 0600 107/60 98 F (36.7 C) Oral (!) 56 20 97 %  05/28/20 2222 125/68 (!) 97.4 F (36.3 C) Oral 60 20 98 %  05/28/20 1503 113/75 (!) 97.4 F (36.3 C) Oral (!) 53 18 96 %    General:  Elderly Caucasian male. well-nourished in  no acute distress.   Eyes:  no scleral icterus.   ENT:  There were no oropharyngeal lesions.   Neck was without thyromegaly.   Lymphatics:  Negative cervical, supraclavicular or axillary adenopathy.   Respiratory: lungs were clear bilaterally without wheezing or crackles.   Cardiovascular:  Regular rate and rhythm, S1/S2, without murmur, rub or gallop.  There was no pedal edema.   GI:  Positive bowel sounds.  Left upper quadrant mass not palpable. Musculoskeletal:  no spinal tenderness of palpation of vertebral spine.   Skin exam was without echymosis, petichae.   Neuro exam was nonfocal. Patient was alert and oriented.  Attention was good.   Language was appropriate.  Mood was normal without depression.  Speech was not pressured.  Thought content was not  tangential.     Lab Results  Component Value Date   WBC 17.9 (H) 05/29/2020   HGB 9.8 (L) 05/29/2020   HCT 30.3 (L) 05/29/2020   PLT 351 05/29/2020   GLUCOSE 141 (H) 05/29/2020   CHOL  12/31/2009    160        ATP III CLASSIFICATION:  <200     mg/dL   Desirable  200-239  mg/dL   Borderline High  >=240    mg/dL   High          TRIG 213 (H) 12/31/2009   HDL 28 (L) 12/31/2009   LDLCALC  12/31/2009    89        Total Cholesterol/HDL:CHD Risk Coronary Heart Disease Risk Table                     Men   Women  1/2 Average Risk   3.4   3.3  Average Risk       5.0   4.4  2 X Average Risk   9.6   7.1  3 X Average Risk  23.4   11.0        Use the calculated Patient Ratio above and the CHD Risk Table to determine the patient's CHD Risk.        ATP III CLASSIFICATION (LDL):  <100     mg/dL   Optimal  100-129  mg/dL   Near or Above                    Optimal  130-159  mg/dL   Borderline  160-189  mg/dL   High  >190     mg/dL   Very High   ALT 34 05/29/2020   AST 29 05/29/2020   NA 139 05/29/2020   K 4.0 05/29/2020   CL 109 05/29/2020   CREATININE 1.09 05/29/2020   BUN 27 (H) 05/29/2020   CO2 24 05/29/2020    CT Biopsy  Result Date: 05/08/2020 CLINICAL DATA:  13 cm left-sided mesenteric abdominal mass. EXAM: CT GUIDED CORE BIOPSY OF MESENTERIC MASS ANESTHESIA/SEDATION: 1.5 mg IV Versed; 75 mcg IV Fentanyl Total Moderate Sedation Time:  18 minutes. The patient's level of consciousness and physiologic status were continuously monitored during the procedure by Radiology nursing. PROCEDURE: The procedure risks, benefits, and alternatives were explained to the patient. Questions regarding the procedure were encouraged and answered. The patient understands and consents to the procedure. A time-out was performed prior to initiating the procedure. CT was performed through the abdomen in a supine position. The left abdominal wall was prepped with chlorhexidine in a sterile fashion, and a  sterile drape was applied covering the operative  field. A sterile gown and sterile gloves were used for the procedure. Local anesthesia was provided with 1% Lidocaine. Under CT guidance, a 17 gauge trocar needle was advanced to the level of a left-sided mesenteric mass. After confirming needle tip position, 4 separate coaxial 18 gauge core biopsy samples were obtained and submitted in saline. Some Gel-Foam pledgets were advanced through the outer needle as the needle was retracted and removed. COMPLICATIONS: None FINDINGS: Large left-sided intra-abdominal mass within the mesentery measures up to 13.3 cm in maximum diameter. Solid tissue was obtained. IMPRESSION: CT-guided core biopsy performed of large left-sided mesenteric mass measuring just over 13 cm in estimated maximal diameter. Electronically Signed   By: Aletta Edouard M.D.   On: 05/08/2020 15:02   ECHOCARDIOGRAM COMPLETE  Result Date: 05/22/2020    ECHOCARDIOGRAM REPORT   Patient Name:   Connor Adams [facility] Date of Exam: 05/22/2020 Medical Rec #:  [id number]        Height:       68.0 in Accession #:    [id number]       Weight:       171.0 lb Date of Birth:  [birth year]        BSA:          1.912 m Patient Age:    28 years         BP:           116/77 mmHg Patient Gender: M                HR:           80 bpm. Exam Location:  Outpatient Procedure: 2D Echo, Cardiac Doppler and Color Doppler Indications:    Chemotherapy evaluation v87.41 / v58.11  History:        Patient has no prior history of Echocardiogram examinations. TIA                 and Stroke; Risk Factors:Hypertension, Dyslipidemia and GERD.  Sonographer:    Bernadene Person RDCS Referring Phys: [id number] [place]  1. Left ventricular ejection fraction, by estimation, is 60 to 65%. The left ventricle has normal function. The left ventricle has no regional wall motion abnormalities. Left ventricular diastolic parameters are consistent with Grade I diastolic dysfunction (impaired  relaxation). The average left ventricular global longitudinal strain is -17.6 %. The global longitudinal strain is normal.  2. Right ventricular systolic function is normal. The right ventricular size is normal.  3. The mitral valve is normal in structure. No evidence of mitral valve regurgitation. No evidence of mitral stenosis.  4. The aortic valve is normal in structure. Aortic valve regurgitation is not visualized. No aortic stenosis is present.  5. The inferior vena cava is normal in size with greater than 50% respiratory variability, suggesting right atrial pressure of 3 mmHg. FINDINGS  Left Ventricle: Left ventricular ejection fraction, by estimation, is 60 to 65%. The left ventricle has normal function. The left ventricle has no regional wall motion abnormalities. The average left ventricular global longitudinal strain is -17.6 %. The global longitudinal strain is normal. The left ventricular internal cavity size was normal in size. There is no left ventricular hypertrophy. Left ventricular diastolic parameters are consistent with Grade I diastolic dysfunction (impaired relaxation). Right Ventricle: The right ventricular size is normal. No increase in right ventricular wall thickness. Right ventricular systolic function is normal. Left Atrium: Left atrial size was normal in size. Right Atrium: Right atrial size was  normal in size. Pericardium: There is no evidence of pericardial effusion. Mitral Valve: The mitral valve is normal in structure. Normal mobility of the mitral valve leaflets. No evidence of mitral valve regurgitation. No evidence of mitral valve stenosis. Tricuspid Valve: The tricuspid valve is normal in structure. Tricuspid valve regurgitation is not demonstrated. No evidence of tricuspid stenosis. Aortic Valve: The aortic valve is normal in structure. Aortic valve regurgitation is not visualized. No aortic stenosis is present. Pulmonic Valve: The pulmonic valve was normal in structure. Pulmonic  valve regurgitation is trivial. No evidence of pulmonic stenosis. Aorta: The aortic root is normal in size and structure. Venous: The inferior vena cava is normal in size with greater than 50% respiratory variability, suggesting right atrial pressure of 3 mmHg. IAS/Shunts: No atrial level shunt detected by color flow Doppler.  LEFT VENTRICLE PLAX 2D LVIDd:         5.10 cm  Diastology LVIDs:         3.20 cm  LV e' lateral:   9.94 cm/s LV PW:         0.70 cm  LV E/e' lateral: 4.6 LV IVS:        0.70 cm  LV e' medial:    6.85 cm/s LVOT diam:     2.10 cm  LV E/e' medial:  6.6 LV SV:         81 LV SV Index:   43       2D Longitudinal Strain LVOT Area:     3.46 cm 2D Strain GLS Avg:     -17.6 %  RIGHT VENTRICLE RV S prime:     9.79 cm/s TAPSE (M-mode): 1.6 cm LEFT ATRIUM             Index       RIGHT ATRIUM           Index LA diam:        3.70 cm 1.94 cm/m  RA Area:     14.40 cm LA Vol (A2C):   69.5 ml 36.35 ml/m RA Volume:   31.90 ml  16.68 ml/m LA Vol (A4C):   51.0 ml 26.67 ml/m LA Biplane Vol: 59.4 ml 31.07 ml/m  AORTIC VALVE LVOT Vmax:   143.00 cm/s LVOT Vmean:  98.900 cm/s LVOT VTI:    0.235 m  AORTA Ao Root diam: 3.20 cm Ao Asc diam:  3.40 cm MITRAL VALVE MV Area (PHT): 2.22 cm    SHUNTS MV Decel Time: 342 msec    Systemic VTI:  0.24 m MV E velocity: 45.30 cm/s  Systemic Diam: 2.10 cm MV A velocity: 72.50 cm/s MV E/A ratio:  0.62 Donato Schultz MD Electronically signed by Donato Schultz MD Signature Date/Time: 05/22/2020/12:55:40 PM    Final    IR IMAGING GUIDED PORT INSERTION  Result Date: 05/19/2020 INDICATION: History of diffuse large B-cell lymphoma. In need of durable intravenous access for chemotherapy administration EXAM: IMPLANTED PORT A CATH PLACEMENT WITH ULTRASOUND AND FLUOROSCOPIC GUIDANCE COMPARISON:  PET-CT-04/28/2020 MEDICATIONS: Ancef 2 gm IV; The antibiotic was administered within an appropriate time interval prior to skin puncture. ANESTHESIA/SEDATION: Moderate (conscious) sedation was employed  during this procedure. A total of Versed 2 mg and Fentanyl 100 mcg was administered intravenously. Moderate Sedation Time: 25 minutes. The patient's level of consciousness and vital signs were monitored continuously by radiology nursing throughout the procedure under my direct supervision. CONTRAST:  None FLUOROSCOPY TIME:  24 seconds (5 mGy) COMPLICATIONS: None immediate. PROCEDURE: The procedure, risks,  benefits, and alternatives were explained to the patient. Questions regarding the procedure were encouraged and answered. The patient understands and consents to the procedure. The right neck and chest were prepped with chlorhexidine in a sterile fashion, and a sterile drape was applied covering the operative field. Maximum barrier sterile technique with sterile gowns and gloves were used for the procedure. A timeout was performed prior to the initiation of the procedure. Local anesthesia was provided with 1% lidocaine with epinephrine. After creating a small venotomy incision, a micropuncture kit was utilized to access the internal jugular vein. Real-time ultrasound guidance was utilized for vascular access including the acquisition of a permanent ultrasound image documenting patency of the accessed vessel. The microwire was utilized to measure appropriate catheter length. A subcutaneous port pocket was then created along the upper chest wall utilizing a combination of sharp and blunt dissection. The pocket was irrigated with sterile saline. A single lumen "Slim" sized power injectable port was chosen for placement. The 8 Fr catheter was tunneled from the port pocket site to the venotomy incision. The port was placed in the pocket. The external catheter was trimmed to appropriate length. At the venotomy, an 8 Fr peel-away sheath was placed over a guidewire under fluoroscopic guidance. The catheter was then placed through the sheath and the sheath was removed. Final catheter positioning was confirmed and documented  with a fluoroscopic spot radiograph. The port was accessed with a Huber needle, aspirated and flushed with heparinized saline. The venotomy site was closed with an interrupted 4-0 Vicryl suture. The port pocket incision was closed with interrupted 2-0 Vicryl suture. The skin was opposed with a running subcuticular 4-0 Vicryl suture. Dermabond and Steri-strips were applied to both incisions. Dressings were applied. The patient tolerated the procedure well without immediate post procedural complication. FINDINGS: After catheter placement, the tip lies within the superior cavoatrial junction. The catheter aspirates and flushes normally and is ready for immediate use. IMPRESSION: Successful placement of a right internal jugular approach power injectable Port-A-Cath. The catheter is ready for immediate use. Electronically Signed   By: Simonne Come M.D.   On: 05/19/2020 15:59     CT Biopsy  Result Date: 05/08/2020 CLINICAL DATA:  13 cm left-sided mesenteric abdominal mass. EXAM: CT GUIDED CORE BIOPSY OF MESENTERIC MASS ANESTHESIA/SEDATION: 1.5 mg IV Versed; 75 mcg IV Fentanyl Total Moderate Sedation Time:  18 minutes. The patient's level of consciousness and physiologic status were continuously monitored during the procedure by Radiology nursing. PROCEDURE: The procedure risks, benefits, and alternatives were explained to the patient. Questions regarding the procedure were encouraged and answered. The patient understands and consents to the procedure. A time-out was performed prior to initiating the procedure. CT was performed through the abdomen in a supine position. The left abdominal wall was prepped with chlorhexidine in a sterile fashion, and a sterile drape was applied covering the operative field. A sterile gown and sterile gloves were used for the procedure. Local anesthesia was provided with 1% Lidocaine. Under CT guidance, a 17 gauge trocar needle was advanced to the level of a left-sided mesenteric mass.  After confirming needle tip position, 4 separate coaxial 18 gauge core biopsy samples were obtained and submitted in saline. Some Gel-Foam pledgets were advanced through the outer needle as the needle was retracted and removed. COMPLICATIONS: None FINDINGS: Large left-sided intra-abdominal mass within the mesentery measures up to 13.3 cm in maximum diameter. Solid tissue was obtained. IMPRESSION: CT-guided core biopsy performed of large left-sided mesenteric mass measuring  just over 13 cm in estimated maximal diameter. Electronically Signed   By: Aletta Edouard M.D.   On: 05/08/2020 15:02   ECHOCARDIOGRAM COMPLETE  Result Date: 05/22/2020    ECHOCARDIOGRAM REPORT   Patient Name:   Connor Adams [facility] Date of Exam: 05/22/2020 Medical Rec #:  [id number]        Height:       68.0 in Accession #:    [id number]       Weight:       171.0 lb Date of Birth:  [birth year]        BSA:          1.912 m Patient Age:    40 years         BP:           116/77 mmHg Patient Gender: M                HR:           80 bpm. Exam Location:  Outpatient Procedure: 2D Echo, Cardiac Doppler and Color Doppler Indications:    Chemotherapy evaluation v87.41 / v58.11  History:        Patient has no prior history of Echocardiogram examinations. TIA                 and Stroke; Risk Factors:Hypertension, Dyslipidemia and GERD.  Sonographer:    Bernadene Person RDCS Referring Phys: [id number] [place]  1. Left ventricular ejection fraction, by estimation, is 60 to 65%. The left ventricle has normal function. The left ventricle has no regional wall motion abnormalities. Left ventricular diastolic parameters are consistent with Grade I diastolic dysfunction (impaired relaxation). The average left ventricular global longitudinal strain is -17.6 %. The global longitudinal strain is normal.  2. Right ventricular systolic function is normal. The right ventricular size is normal.  3. The mitral valve is normal in structure. No evidence of mitral  valve regurgitation. No evidence of mitral stenosis.  4. The aortic valve is normal in structure. Aortic valve regurgitation is not visualized. No aortic stenosis is present.  5. The inferior vena cava is normal in size with greater than 50% respiratory variability, suggesting right atrial pressure of 3 mmHg. FINDINGS  Left Ventricle: Left ventricular ejection fraction, by estimation, is 60 to 65%. The left ventricle has normal function. The left ventricle has no regional wall motion abnormalities. The average left ventricular global longitudinal strain is -17.6 %. The global longitudinal strain is normal. The left ventricular internal cavity size was normal in size. There is no left ventricular hypertrophy. Left ventricular diastolic parameters are consistent with Grade I diastolic dysfunction (impaired relaxation). Right Ventricle: The right ventricular size is normal. No increase in right ventricular wall thickness. Right ventricular systolic function is normal. Left Atrium: Left atrial size was normal in size. Right Atrium: Right atrial size was normal in size. Pericardium: There is no evidence of pericardial effusion. Mitral Valve: The mitral valve is normal in structure. Normal mobility of the mitral valve leaflets. No evidence of mitral valve regurgitation. No evidence of mitral valve stenosis. Tricuspid Valve: The tricuspid valve is normal in structure. Tricuspid valve regurgitation is not demonstrated. No evidence of tricuspid stenosis. Aortic Valve: The aortic valve is normal in structure. Aortic valve regurgitation is not visualized. No aortic stenosis is present. Pulmonic Valve: The pulmonic valve was normal in structure. Pulmonic valve regurgitation is trivial. No evidence of pulmonic stenosis. Aorta: The aortic root is normal in  size and structure. Venous: The inferior vena cava is normal in size with greater than 50% respiratory variability, suggesting right atrial pressure of 3 mmHg. IAS/Shunts: No  atrial level shunt detected by color flow Doppler.  LEFT VENTRICLE PLAX 2D LVIDd:         5.10 cm  Diastology LVIDs:         3.20 cm  LV e' lateral:   9.94 cm/s LV PW:         0.70 cm  LV E/e' lateral: 4.6 LV IVS:        0.70 cm  LV e' medial:    6.85 cm/s LVOT diam:     2.10 cm  LV E/e' medial:  6.6 LV SV:         81 LV SV Index:   43       2D Longitudinal Strain LVOT Area:     3.46 cm 2D Strain GLS Avg:     -17.6 %  RIGHT VENTRICLE RV S prime:     9.79 cm/s TAPSE (M-mode): 1.6 cm LEFT ATRIUM             Index       RIGHT ATRIUM           Index LA diam:        3.70 cm 1.94 cm/m  RA Area:     14.40 cm LA Vol (A2C):   69.5 ml 36.35 ml/m RA Volume:   31.90 ml  16.68 ml/m LA Vol (A4C):   51.0 ml 26.67 ml/m LA Biplane Vol: 59.4 ml 31.07 ml/m  AORTIC VALVE LVOT Vmax:   143.00 cm/s LVOT Vmean:  98.900 cm/s LVOT VTI:    0.235 m  AORTA Ao Root diam: 3.20 cm Ao Asc diam:  3.40 cm MITRAL VALVE MV Area (PHT): 2.22 cm    SHUNTS MV Decel Time: 342 msec    Systemic VTI:  0.24 m MV E velocity: 45.30 cm/s  Systemic Diam: 2.10 cm MV A velocity: 72.50 cm/s MV E/A ratio:  0.62 Candee Furbish MD Electronically signed by Candee Furbish MD Signature Date/Time: 05/22/2020/12:55:40 PM    Final    IR IMAGING GUIDED PORT INSERTION  Result Date: 05/19/2020 INDICATION: History of diffuse large B-cell lymphoma. In need of durable intravenous access for chemotherapy administration EXAM: IMPLANTED PORT A CATH PLACEMENT WITH ULTRASOUND AND FLUOROSCOPIC GUIDANCE COMPARISON:  PET-CT-04/28/2020 MEDICATIONS: Ancef 2 gm IV; The antibiotic was administered within an appropriate time interval prior to skin puncture. ANESTHESIA/SEDATION: Moderate (conscious) sedation was employed during this procedure. A total of Versed 2 mg and Fentanyl 100 mcg was administered intravenously. Moderate Sedation Time: 25 minutes. The patient's level of consciousness and vital signs were monitored continuously by radiology nursing throughout the procedure under my  direct supervision. CONTRAST:  None FLUOROSCOPY TIME:  24 seconds (5 mGy) COMPLICATIONS: None immediate. PROCEDURE: The procedure, risks, benefits, and alternatives were explained to the patient. Questions regarding the procedure were encouraged and answered. The patient understands and consents to the procedure. The right neck and chest were prepped with chlorhexidine in a sterile fashion, and a sterile drape was applied covering the operative field. Maximum barrier sterile technique with sterile gowns and gloves were used for the procedure. A timeout was performed prior to the initiation of the procedure. Local anesthesia was provided with 1% lidocaine with epinephrine. After creating a small venotomy incision, a micropuncture kit was utilized to access the internal jugular vein. Real-time ultrasound guidance was utilized for vascular  access including the acquisition of a permanent ultrasound image documenting patency of the accessed vessel. The microwire was utilized to measure appropriate catheter length. A subcutaneous port pocket was then created along the upper chest wall utilizing a combination of sharp and blunt dissection. The pocket was irrigated with sterile saline. A single lumen "Slim" sized power injectable port was chosen for placement. The 8 Fr catheter was tunneled from the port pocket site to the venotomy incision. The port was placed in the pocket. The external catheter was trimmed to appropriate length. At the venotomy, an 8 Fr peel-away sheath was placed over a guidewire under fluoroscopic guidance. The catheter was then placed through the sheath and the sheath was removed. Final catheter positioning was confirmed and documented with a fluoroscopic spot radiograph. The port was accessed with a Huber needle, aspirated and flushed with heparinized saline. The venotomy site was closed with an interrupted 4-0 Vicryl suture. The port pocket incision was closed with interrupted 2-0 Vicryl suture. The  skin was opposed with a running subcuticular 4-0 Vicryl suture. Dermabond and Steri-strips were applied to both incisions. Dressings were applied. The patient tolerated the procedure well without immediate post procedural complication. FINDINGS: After catheter placement, the tip lies within the superior cavoatrial junction. The catheter aspirates and flushes normally and is ready for immediate use. IMPRESSION: Successful placement of a right internal jugular approach power injectable Port-A-Cath. The catheter is ready for immediate use. Electronically Signed   By: Sandi Mariscal M.D.   On: 05/19/2020 15:59    Assessment and Plan:  Connor Adams 74 y.o. male with medical history significant for Stage I DLBCL who presents for a follow up visit.  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  He is currently receiving cycle #2 of EPOCH-R starting on 06/16/2020.  Today he is Cycle 2 Day 4 of treatment.  He is tolerating his chemotherapy well so far with no nausea or vomiting.  Bowels are moving (stools softer with addition of MiraLAX) and he continues to have a good appetite.  There are no barriers to proceeding with treatment.      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 Stage I bulky disease.   --Continue EPOCH-R. Today is Cycle 2 Day 4 --Adverse effects have been reviewed including, but not limited to, alopecia, myelosuppression, peripheral neuropathy, mucositis, liver/renal dysfunction, and possible infusion reaction associated with Rituxan. He agrees to proceed. --PAC in place and ready for use. --Echocardiogram from 05/22/2020 shows  LVEF of 60-65%  --CBC adequate to proceed with treatment today.  Leukocytosis likely related to steroids.  Hgb continues trending downward this morning.  CMET, LDH, uric acid are adequate to proceed with treatment today.  --Will monitor closely with daily CBC with diff, CMET, LDH, and uric acid --continue Allopurinol 300 mg daily.  --Lovenox for DVT prophylaxis. --Continue home medications for HTN and hyperlipidemia.  --Continue PRN antiemetics. --Continue Senokot BID and MiraLAX daily as needed.   The patient will return to the cancer on 06/23/2020 for Rituxan and Neulasta and will have labs and a follow-up visit on 06/25/2020.  [name], DNP, AGPCNP-BC, AOCNP Mon/Tues/Thurs/Fri 7am-5pm; Off Wednesdays Cell: [phone]  [phone]  06/19/20 9:57 AM

## 2020-06-20 LAB — COMPREHENSIVE METABOLIC PANEL
ALT: 55 U/L — ABNORMAL HIGH (ref 0–44)
AST: 26 U/L (ref 15–41)
Albumin: 2.8 g/dL — ABNORMAL LOW (ref 3.5–5.0)
Alkaline Phosphatase: 51 U/L (ref 38–126)
Anion gap: 9 (ref 5–15)
BUN: 30 mg/dL — ABNORMAL HIGH (ref 8–23)
CO2: 27 mmol/L (ref 22–32)
Calcium: 8.3 mg/dL — ABNORMAL LOW (ref 8.9–10.3)
Chloride: 107 mmol/L (ref 98–111)
Creatinine, Ser: 1 mg/dL (ref 0.61–1.24)
GFR calc Af Amer: >60 mL/min (ref >60)
GFR calc non Af Amer: >60 mL/min (ref >60)
Glucose, Bld: 109 mg/dL — ABNORMAL HIGH (ref 70–99)
Potassium: 3.8 mmol/L (ref 3.5–5.1)
Sodium: 143 mmol/L (ref 135–145)
Total Bilirubin: 0.6 mg/dL (ref 0.3–1.2)
Total Protein: 5 g/dL — ABNORMAL LOW (ref 6.5–8.1)

## 2020-06-20 LAB — CBC WITH DIFFERENTIAL/PLATELET
Abs Immature Granulocytes: 0.14 10*3/uL — ABNORMAL HIGH (ref 0.00–0.07)
Basophils Absolute: 0 10*3/uL (ref 0.0–0.1)
Basophils Relative: 0 %
Eosinophils Absolute: 0 10*3/uL (ref 0.0–0.5)
Eosinophils Relative: 0 %
HCT: 28.2 % — ABNORMAL LOW (ref 39.0–52.0)
Hemoglobin: 9 g/dL — ABNORMAL LOW (ref 13.0–17.0)
Immature Granulocytes: 1 %
Lymphocytes Relative: 4 %
Lymphs Abs: 0.4 10*3/uL — ABNORMAL LOW (ref 0.7–4.0)
MCH: 29 pg (ref 26.0–34.0)
MCHC: 31.9 g/dL (ref 30.0–36.0)
MCV: 91 fL (ref 80.0–100.0)
Monocytes Absolute: 0.2 10*3/uL (ref 0.1–1.0)
Monocytes Relative: 2 %
Neutro Abs: 10.2 10*3/uL — ABNORMAL HIGH (ref 1.7–7.7)
Neutrophils Relative %: 93 %
Platelets: 354 10*3/uL (ref 150–400)
RBC: 3.1 MIL/uL — ABNORMAL LOW (ref 4.22–5.81)
RDW: 16.7 % — ABNORMAL HIGH (ref 11.5–15.5)
WBC: 10.9 10*3/uL — ABNORMAL HIGH (ref 4.0–10.5)
nRBC: 0 % (ref 0.0–0.2)

## 2020-06-20 LAB — URIC ACID: Uric Acid, Serum: 4.1 mg/dL (ref 3.7–8.6)

## 2020-06-20 LAB — LACTATE DEHYDROGENASE: LDH: 117 U/L (ref 98–192)

## 2020-06-20 MED ORDER — ACETAMINOPHEN 325 MG PO TABS: 650.0000 mg | ORAL_TABLET | Freq: Four times a day (QID) | ORAL | Status: AC | PRN

## 2020-06-20 MED ORDER — HEPARIN SOD (PORK) LOCK FLUSH 100 UNIT/ML IV SOLN
500.0000 [IU] | INTRAVENOUS | Status: DC | PRN
  Filled 2020-06-20 (×2): qty 5

## 2020-06-20 MED ORDER — INFLUENZA VAC A&B SA ADJ QUAD 0.5 ML IM PRSY
0.5000 mL | PREFILLED_SYRINGE | Freq: Once | INTRAMUSCULAR | Status: AC
  Administered 2020-06-20: 0.5 mL via INTRAMUSCULAR
  Filled 2020-06-20: qty 0.5

## 2020-06-20 MED ORDER — ENSURE ENLIVE PO LIQD: 237.0000 mL | Freq: Two times a day (BID) | ORAL | 12 refills | Status: DC

## 2020-06-20 MED ORDER — SODIUM CHLORIDE 0.9 % IV SOLN
750.0000 mg/m2 | Freq: Once | INTRAVENOUS | Status: AC
  Administered 2020-06-20: 1440 mg via INTRAVENOUS
  Filled 2020-06-20: qty 72

## 2020-06-20 MED ORDER — SODIUM CHLORIDE 0.9 % IV SOLN
Freq: Once | INTRAVENOUS | Status: AC
  Administered 2020-06-20: 16 mg via INTRAVENOUS
  Filled 2020-06-20: qty 8

## 2020-06-20 NOTE — Progress Notes (Signed)
Patient discharged to home w/ family. Given all belongings, instructions. Verbalized understanding of all instructions.

## 2020-06-20 NOTE — Discharge Summary (Signed)
Physician Discharge Summary  Patient ID: Connor Adams MRN: [id number] DOB/AGE: [birth year] 74 y.o.  Admit date: 06/16/2020 Discharge date: 06/20/2020  Discharge Diagnoses:  Active Problems:   DLBCL (diffuse large B cell lymphoma) ([place])   Encounter for antineoplastic chemotherapy  Discharged Condition: good  Discharge Labs:  None pending.   Significant Diagnostic Studies:   CBC Latest Ref Rng & Units 06/20/2020 06/19/2020 06/18/2020  WBC 4.0 - 10.5 K/uL 10.9(H) 18.8(H) 25.1(H)  Hemoglobin 13.0 - 17.0 g/dL 9.0(L) 8.6(L) 8.9(L)  Hematocrit 39 - 52 % 28.2(L) 27.0(L) 28.4(L)  Platelets 150 - 400 K/uL 354 348 352   CMP Latest Ref Rng & Units 06/20/2020 06/19/2020 06/18/2020  Glucose 70 - 99 mg/dL 109(H) 149(H) 158(H)  BUN 8 - 23 mg/dL 30(H) 31(H) 29(H)  Creatinine 0.61 - 1.24 mg/dL 1.00 1.06 1.03  Sodium 135 - 145 mmol/L 143 141 143  Potassium 3.5 - 5.1 mmol/L 3.8 3.8 4.2  Chloride 98 - 111 mmol/L 107 108 109  CO2 22 - 32 mmol/L 27 25 26   Calcium 8.9 - 10.3 mg/dL 8.3(L) 8.3(L) 8.6(L)  Total Protein 6.5 - 8.1 g/dL 5.0(L) 4.9(L) 5.2(L)  Total Bilirubin 0.3 - 1.2 mg/dL 0.6 0.6 0.2(L)  Alkaline Phos 38 - 126 U/L 51 53 60  AST 15 - 41 U/L 26 26 23   ALT 0 - 44 U/L 55(H) 49(H) 46(H)   Consults: None  Procedures: None  Disposition: Discharge disposition: 01-Home or Self Care      Day of Discharge Physical Exam:  General:  well-nourished in no acute distress.   Eyes:  no scleral icterus.   Respiratory: lungs were clear bilaterally without wheezing or crackles.   Cardiovascular:  Regular rate and rhythm, S1/S2, without murmur, rub or gallop.  There was no pedal edema.   GI:  Positive bowel sounds.  Left upper quadrant abdominal mass nonpalpable.   Musculoskeletal:  no spinal tenderness of palpation of vertebral spine.   Skin exam was without echymosis, petichae.   Neuro exam was nonfocal. Patient was alert and oriented.  Attention was good.   Language was appropriate.  Mood was  normal without depression.  Speech was not pressured.  Thought content was not tangential.    Allergies as of 06/20/2020   No Known Allergies     Medication List    TAKE these medications   acetaminophen 325 MG tablet Commonly known as: TYLENOL Take 2 tablets (650 mg total) by mouth every 6 (six) hours as needed for mild pain, moderate pain or fever.   allopurinol 300 MG tablet Commonly known as: ZYLOPRIM Take 1 tablet (300 mg total) by mouth daily.   Cozaar 25 MG tablet Generic drug: losartan Take 25 mg by mouth daily.   Crestor 5 MG tablet Generic drug: rosuvastatin Take 5 mg by mouth daily.   feeding supplement (ENSURE ENLIVE) Liqd Take 237 mLs by mouth 2 (two) times daily between meals.   hydrocortisone 2.5 % rectal cream Commonly known as: ANUSOL-HC Place 1 application rectally daily as needed. What changed: reasons to take this   multivitamins ther. w/minerals Tabs tablet Take 1 tablet by mouth daily.   ondansetron 8 MG disintegrating tablet Commonly known as: Zofran ODT Take 1 tablet (8 mg total) by mouth every 8 (eight) hours as needed for nausea or vomiting.   pantoprazole 40 MG tablet Commonly known as: PROTONIX Take 1 tablet (40 mg total) by mouth 2 (two) times daily.   polyethylene glycol 17 g packet Commonly known as:  MIRALAX / GLYCOLAX Take 17 g by mouth daily as needed for moderate constipation.   prochlorperazine 10 MG tablet Commonly known as: COMPAZINE Take 1 tablet (10 mg total) by mouth every 6 (six) hours as needed for nausea or vomiting.   promethazine 25 MG tablet Commonly known as: PHENERGAN Take 1 tablet (25 mg total) by mouth every 6 (six) hours as needed for nausea or vomiting.   senna-docusate 8.6-50 MG tablet Commonly known as: Senokot-S Take 2 tablets by mouth 2 (two) times daily as needed for mild constipation.   sodium bicarbonate/sodium chloride Soln 1 application by Mouth Rinse route as needed for dry mouth.   sucralfate  1 g tablet Commonly known as: Carafate Take 1 tablet (1 g total) by mouth 4 (four) times daily -  with meals and at bedtime.        Hospital Course: [name] is a 74 year old male with medical history significant for double hit DLBCL. He presented on 06/16/2020 for Cycle 2 Day 1 of EPOCH-R chemotherapy.  Chemotherapy started as planned on 06/16/2020.  Throughout the course of the patient's admission he has had no complications.  He had daily bowel movements that were initially hard, but improved with the addition of MiraLAX.  He was able to tolerate food well without difficulty.  He did not have any issues with fevers, chills, sweats, nausea, vomiting or diarrhea.  He successfully completed all chemotherapy at full dose as prescribed. On the final day he had no concerning symptoms.    He is currently scheduled for G-CSF and rituximab at the cancer center on 06/23/2020.  He will have a follow-up visit in our office on 06/25/2020 with repeat lab work.  He has our contact information and has been instructed to contact us in the event he were to develop any new concerning symptoms or issues in the interim.  He was discharged on 06/20/2020 in excellent condition.   Discharge Instructions    Activity as tolerated - No restrictions   Complete by: As directed    Diet general   Complete by: As directed       [name], DNP, AGPCNP-BC, AOCNP Mon/Tues/Thurs/Fri 7am-5pm; Off Wednesdays Cell: [phone]

## 2020-06-20 NOTE — Progress Notes (Signed)
Cyclophosphamide dosage, total VTBI, expiration date/time, 2 pharmacy signatures, and patient identifiers independently verified by me and by Aldean Baker, RN.  Zandra Abts [facility]  06/20/2020

## 2020-06-22 ENCOUNTER — Other Ambulatory Visit: Payer: Self-pay | Admitting: Hematology and Oncology

## 2020-06-22 DIAGNOSIS — C8333 Diffuse large B-cell lymphoma, intra-abdominal lymph nodes: Secondary | ICD-10-CM

## 2020-06-23 ENCOUNTER — Ambulatory Visit: Payer: Medicare PPO

## 2020-06-23 ENCOUNTER — Other Ambulatory Visit: Payer: Medicare PPO

## 2020-06-23 ENCOUNTER — Other Ambulatory Visit: Payer: Self-pay

## 2020-06-23 ENCOUNTER — Inpatient Hospital Stay: Payer: Medicare PPO

## 2020-06-23 ENCOUNTER — Inpatient Hospital Stay: Payer: Medicare PPO | Attending: Hematology and Oncology

## 2020-06-23 VITALS — BP 128/76 | HR 65 | Temp 98.5°F | Resp 20 | Wt 164.0 lb

## 2020-06-23 DIAGNOSIS — C8338 Diffuse large B-cell lymphoma, lymph nodes of multiple sites: Secondary | ICD-10-CM | POA: Insufficient documentation

## 2020-06-23 DIAGNOSIS — Z95828 Presence of other vascular implants and grafts: Secondary | ICD-10-CM

## 2020-06-23 DIAGNOSIS — Z5111 Encounter for antineoplastic chemotherapy: Secondary | ICD-10-CM | POA: Diagnosis not present

## 2020-06-23 DIAGNOSIS — Z79899 Other long term (current) drug therapy: Secondary | ICD-10-CM | POA: Diagnosis not present

## 2020-06-23 DIAGNOSIS — C8333 Diffuse large B-cell lymphoma, intra-abdominal lymph nodes: Secondary | ICD-10-CM

## 2020-06-23 LAB — CBC WITH DIFFERENTIAL (CANCER CENTER ONLY)
Abs Immature Granulocytes: 0.06 10*3/uL (ref 0.00–0.07)
Basophils Absolute: 0 10*3/uL (ref 0.0–0.1)
Basophils Relative: 0 %
Eosinophils Absolute: 0.2 10*3/uL (ref 0.0–0.5)
Eosinophils Relative: 3 %
HCT: 28.8 % — ABNORMAL LOW (ref 39.0–52.0)
Hemoglobin: 9.6 g/dL — ABNORMAL LOW (ref 13.0–17.0)
Immature Granulocytes: 1 %
Lymphocytes Relative: 3 %
Lymphs Abs: 0.2 10*3/uL — ABNORMAL LOW (ref 0.7–4.0)
MCH: 28.6 pg (ref 26.0–34.0)
MCHC: 33.3 g/dL (ref 30.0–36.0)
MCV: 85.7 fL (ref 80.0–100.0)
Monocytes Absolute: 0 10*3/uL — ABNORMAL LOW (ref 0.1–1.0)
Monocytes Relative: 0 %
Neutro Abs: 5.7 10*3/uL (ref 1.7–7.7)
Neutrophils Relative %: 93 %
Platelet Count: 325 10*3/uL (ref 150–400)
RBC: 3.36 MIL/uL — ABNORMAL LOW (ref 4.22–5.81)
RDW: 16.8 % — ABNORMAL HIGH (ref 11.5–15.5)
WBC Count: 6 10*3/uL (ref 4.0–10.5)
nRBC: 0 % (ref 0.0–0.2)

## 2020-06-23 LAB — CMP (CANCER CENTER ONLY)
ALT: 40 U/L (ref 0–44)
AST: 16 U/L (ref 15–41)
Albumin: 3 g/dL — ABNORMAL LOW (ref 3.5–5.0)
Alkaline Phosphatase: 60 U/L (ref 38–126)
Anion gap: 8 (ref 5–15)
BUN: 25 mg/dL — ABNORMAL HIGH (ref 8–23)
CO2: 28 mmol/L (ref 22–32)
Calcium: 8.7 mg/dL — ABNORMAL LOW (ref 8.9–10.3)
Chloride: 104 mmol/L (ref 98–111)
Creatinine: 0.99 mg/dL (ref 0.61–1.24)
GFR, Est AFR Am: >60 mL/min (ref >60)
GFR, Estimated: >60 mL/min (ref >60)
Glucose, Bld: 120 mg/dL — ABNORMAL HIGH (ref 70–99)
Potassium: 3.6 mmol/L (ref 3.5–5.1)
Sodium: 140 mmol/L (ref 135–145)
Total Bilirubin: 0.9 mg/dL (ref 0.3–1.2)
Total Protein: 5.4 g/dL — ABNORMAL LOW (ref 6.5–8.1)

## 2020-06-23 LAB — URIC ACID: Uric Acid, Serum: 4.5 mg/dL (ref 3.7–8.6)

## 2020-06-23 LAB — LACTATE DEHYDROGENASE: LDH: 145 U/L (ref 98–192)

## 2020-06-23 MED ORDER — ACETAMINOPHEN 325 MG PO TABS
650.0000 mg | ORAL_TABLET | Freq: Once | ORAL | Status: AC
  Administered 2020-06-23: 650 mg via ORAL

## 2020-06-23 MED ORDER — SODIUM CHLORIDE 0.9% FLUSH
10.0000 mL | Freq: Once | INTRAVENOUS | Status: AC
  Administered 2020-06-23: 10 mL via INTRAVENOUS
  Filled 2020-06-23: qty 10

## 2020-06-23 MED ORDER — PEGFILGRASTIM-CBQV 6 MG/0.6ML ~~LOC~~ SOSY
PREFILLED_SYRINGE | SUBCUTANEOUS | Status: AC
  Filled 2020-06-23: qty 0.6

## 2020-06-23 MED ORDER — DIPHENHYDRAMINE HCL 25 MG PO CAPS
ORAL_CAPSULE | ORAL | Status: AC
  Filled 2020-06-23: qty 1

## 2020-06-23 MED ORDER — DIPHENHYDRAMINE HCL 25 MG PO CAPS
25.0000 mg | ORAL_CAPSULE | Freq: Once | ORAL | Status: AC
  Administered 2020-06-23: 25 mg via ORAL

## 2020-06-23 MED ORDER — ACETAMINOPHEN 325 MG PO TABS
ORAL_TABLET | ORAL | Status: AC
  Filled 2020-06-23: qty 2

## 2020-06-23 MED ORDER — PEGFILGRASTIM-CBQV 6 MG/0.6ML ~~LOC~~ SOSY
6.0000 mg | PREFILLED_SYRINGE | Freq: Once | SUBCUTANEOUS | Status: AC
  Administered 2020-06-23: 6 mg via SUBCUTANEOUS

## 2020-06-23 MED ORDER — SODIUM CHLORIDE 0.9 % IV SOLN
375.0000 mg/m2 | Freq: Once | INTRAVENOUS | Status: AC
  Administered 2020-06-23: 700 mg via INTRAVENOUS
  Filled 2020-06-23: qty 50

## 2020-06-23 MED ORDER — SODIUM CHLORIDE 0.9 % IV SOLN
INTRAVENOUS | Status: DC
  Filled 2020-06-23: qty 250

## 2020-06-23 MED ORDER — HEPARIN SOD (PORK) LOCK FLUSH 100 UNIT/ML IV SOLN
500.0000 [IU] | Freq: Once | INTRAVENOUS | Status: AC
  Administered 2020-06-23: 500 [IU] via INTRAVENOUS
  Filled 2020-06-23: qty 5

## 2020-06-23 MED ORDER — HEPARIN SOD (PORK) LOCK FLUSH 100 UNIT/ML IV SOLN
500.0000 [IU] | Freq: Once | INTRAVENOUS | Status: DC
  Filled 2020-06-23: qty 5

## 2020-06-23 NOTE — Patient Instructions (Signed)
[facility] Discharge Instructions for Patients Receiving Chemotherapy  Today you received the following monoclonal antibody agents Rituximab-pvvr (RUXIENCE).  To help prevent nausea and vomiting after your treatment, we encourage you to take your nausea medication as prescribed.   If you develop nausea and vomiting that is not controlled by your nausea medication, call the clinic.   BELOW ARE SYMPTOMS THAT SHOULD BE REPORTED IMMEDIATELY:  *FEVER GREATER THAN 100.5 F  *CHILLS WITH OR WITHOUT FEVER  NAUSEA AND VOMITING THAT IS NOT CONTROLLED WITH YOUR NAUSEA MEDICATION  *UNUSUAL SHORTNESS OF BREATH  *UNUSUAL BRUISING OR BLEEDING  TENDERNESS IN MOUTH AND THROAT WITH OR WITHOUT PRESENCE OF ULCERS  *URINARY PROBLEMS  *BOWEL PROBLEMS  UNUSUAL RASH Items with * indicate a potential emergency and should be followed up as soon as possible.  Feel free to call the clinic should you have any questions or concerns. The clinic phone number is [phone].  Please show the [place] at check-in to the Emergency Department and triage nurse.  Pegfilgrastim injection What is this medicine? PEGFILGRASTIM (PEG fil gra stim) is a long-acting granulocyte colony-stimulating factor that stimulates the growth of neutrophils, a type of white blood cell important in the body's fight against infection. It is used to reduce the incidence of fever and infection in patients with certain types of cancer who are receiving chemotherapy that affects the bone marrow, and to increase survival after being exposed to high doses of radiation. This medicine may be used for other purposes; ask your health care provider or pharmacist if you have questions. COMMON BRAND NAME(S): Steve Rattler, Ziextenzo What should I tell my health care provider before I take this medicine? They need to know if you have any of these conditions:  kidney disease  latex allergy  ongoing  radiation therapy  sickle cell disease  skin reactions to acrylic adhesives (On-Body Injector only)  an unusual or allergic reaction to pegfilgrastim, filgrastim, other medicines, foods, dyes, or preservatives  pregnant or trying to get pregnant  breast-feeding How should I use this medicine? This medicine is for injection under the skin. If you get this medicine at home, you will be taught how to prepare and give the pre-filled syringe or how to use the On-body Injector. Refer to the patient Instructions for Use for detailed instructions. Use exactly as directed. Tell your healthcare provider immediately if you suspect that the On-body Injector may not have performed as intended or if you suspect the use of the On-body Injector resulted in a missed or partial dose. It is important that you put your used needles and syringes in a special sharps container. Do not put them in a trash can. If you do not have a sharps container, call your pharmacist or healthcare provider to get one. Talk to your pediatrician regarding the use of this medicine in children. While this drug may be prescribed for selected conditions, precautions do apply. Overdosage: If you think you have taken too much of this medicine contact a poison control center or emergency room at once. NOTE: This medicine is only for you. Do not share this medicine with others. What if I miss a dose? It is important not to miss your dose. Call your doctor or health care professional if you miss your dose. If you miss a dose due to an On-body Injector failure or leakage, a new dose should be administered as soon as possible using a single prefilled syringe for manual use. What  may interact with this medicine? Interactions have not been studied. Give your health care provider a list of all the medicines, herbs, non-prescription drugs, or dietary supplements you use. Also tell them if you smoke, drink alcohol, or use illegal drugs. Some items may  interact with your medicine. This list may not describe all possible interactions. Give your health care provider a list of all the medicines, herbs, non-prescription drugs, or dietary supplements you use. Also tell them if you smoke, drink alcohol, or use illegal drugs. Some items may interact with your medicine. What should I watch for while using this medicine? You may need blood work done while you are taking this medicine. If you are going to need a MRI, CT scan, or other procedure, tell your doctor that you are using this medicine (On-Body Injector only). What side effects may I notice from receiving this medicine? Side effects that you should report to your doctor or health care professional as soon as possible:  allergic reactions like skin rash, itching or hives, swelling of the face, lips, or tongue  back pain  dizziness  fever  pain, redness, or irritation at site where injected  pinpoint red spots on the skin  red or dark-brown urine  shortness of breath or breathing problems  stomach or side pain, or pain at the shoulder  swelling  tiredness  trouble passing urine or change in the amount of urine Side effects that usually do not require medical attention (report to your doctor or health care professional if they continue or are bothersome):  bone pain  muscle pain This list may not describe all possible side effects. Call your doctor for medical advice about side effects. You may report side effects to FDA at 1-800-FDA-1088. Where should I keep my medicine? Keep out of the reach of children. If you are using this medicine at home, you will be instructed on how to store it. Throw away any unused medicine after the expiration date on the label. NOTE: This sheet is a summary. It may not cover all possible information. If you have questions about this medicine, talk to your doctor, pharmacist, or health care provider.  2020 Elsevier/Gold Standard (2017-12-12  16:57:08)

## 2020-06-24 ENCOUNTER — Telehealth: Payer: Self-pay | Admitting: *Deleted

## 2020-06-25 ENCOUNTER — Inpatient Hospital Stay: Payer: Medicare PPO | Admitting: Hematology and Oncology

## 2020-06-25 ENCOUNTER — Other Ambulatory Visit: Payer: Self-pay

## 2020-06-25 ENCOUNTER — Encounter: Payer: Self-pay | Admitting: Hematology and Oncology

## 2020-06-25 VITALS — BP 110/67 | HR 80 | Temp 98.0°F | Resp 16 | Ht 68.0 in | Wt 160.8 lb

## 2020-06-25 DIAGNOSIS — C8338 Diffuse large B-cell lymphoma, lymph nodes of multiple sites: Secondary | ICD-10-CM | POA: Diagnosis not present

## 2020-06-25 DIAGNOSIS — C8333 Diffuse large B-cell lymphoma, intra-abdominal lymph nodes: Secondary | ICD-10-CM | POA: Diagnosis not present

## 2020-06-25 DIAGNOSIS — Z5111 Encounter for antineoplastic chemotherapy: Secondary | ICD-10-CM | POA: Diagnosis not present

## 2020-06-25 DIAGNOSIS — Z79899 Other long term (current) drug therapy: Secondary | ICD-10-CM | POA: Diagnosis not present

## 2020-06-25 NOTE — Progress Notes (Signed)
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   1) 04/17/2020:  CT A/P showed dominant nodal mass (11.5 x 10.9 cm) within the jejunal mesentery, highly suspicious for lymphoma. Additionally there are left lower lobe pleural-based pulmonary nodules 2) 04/24/2020: establish care with Dr. Lorenso Courier 3) 04/28/2020:  PET CT scan shows Large solid left mesenteric mass 13.0 cm with maximum SUV of 21.2, [place] 5 with local hypermetabolic porta hepatis and retroperitoneal lymph nodes. Constitutes bulky Stage I disease.  4) 05/27/2020-05/31/2020: Cycle 1 of R-EPOCH 5) 06/16/2020-06/23/2020: Cycle 2 of R-EPOCH  Interval History:  SUSUMU HACKLER 74 y.o. male with medical history significant for Stage I DLBCL who presents for a follow up visit. The patient was last seen while in house from 06/16/2020-06/20/2020 for R-EPOCH. In the interim since then he received rituximab and GCSF therapy on 06/23/2020.  On exam today Leyh notes he has been well since his last dose of chemotherapy.  He notes over the weekend in the morning he was "sluggish" however in the afternoons he was able to go to the weight room and work for about 30 minutes.  He also notes yesterday he was able to do a mile on the bike.  He was having some minor issues with sinus headaches and took some Mucinex spray and felt "100% better".  He has has not had any discharge, fevers, headaches, or other signs of active infection since that time.  He notes he did not have a bowel movement on Monday and so he took some senna docusate and it "worked quite well yesterday".  He notes he had a large bowel movement and feels relieved.  He reports his appetite is still good and that the "problem with smelling is now easing off".  He notes that  everything is okay and he requires no additional refills today he denies having any fevers, chills, sweats, nausea, vomiting or diarrhea.  A full 10 point ROS is the blood.  MEDICAL HISTORY:  Past Medical History:  Diagnosis Date  . Arthritis   . Chronic kidney disease (CKD), stage III (moderate) (HCC)   . COVID-19   . GERD (gastroesophageal reflux disease)    occ  . Hemorrhoids   . History of ETT 3/05   low risk  . History of hiatal hernia   . History of kidney stones   . HLD (hyperlipidemia)   . HTN (hypertension)   . Left inguinal hernia   . Melanoma ([place])    excied  . 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 Stroke [facility]) 2011   tia   . TIA (transient ischemic attack) 4/11   associated w slurred speecha ndmild facial droop. lasted for about 10 minutes. Had MRI, etc with [facility] that per his report was ok (dr. Stephannie Li). Echo (4/11): EF 55-60%, no regional WMAs, normal diastolic function, mild LAE, no source of embolus    SURGICAL HISTORY: Past Surgical History:  Procedure Laterality Date  . BMI 30.2 muscular  12/08/04  . curretage actinic keratosis R ear  01/20/05  . ETT low prob of CAD  11/21/03  . excision melanoma in situ back  01/20/05  . hemorrhoids sclerosed at colonoscopy  12/19/05  . IR IMAGING GUIDED PORT INSERTION  05/19/2020  . LAPAROSCOPIC INGUINAL HERNIA REPAIR Left 12/20/1995  .  LUMBAR DISC SURGERY    . NERVE REPAIR     back of neck  . retinal laser surgery Left 09/20/1986  . SEPTOPLASTY  11/19/98  . TOTAL KNEE ARTHROPLASTY Left 12/13/2016   Procedure: TOTAL KNEE ARTHROPLASTY WITH RIGHT KNEE CORTISONE INJECTION;  Surgeon: Frederik Pear, MD;  Location: [facility];  Service: Orthopedics;  Laterality: Left;  . uric acid 6.9  12/22/04  . x-ray l great toe  07/21/00    SOCIAL HISTORY: Social History   Socioeconomic History  . Marital status: Married    Spouse name: [name]  . Number of children: 2  . Years of education: 60  . Highest education level: Not on  file  Occupational History  . Occupation: Systems developer: [employer]  Tobacco Use  . Smoking status: Never Smoker  . Smokeless tobacco: Never Used  Vaping Use  . Vaping Use: Never used  Substance and Sexual Activity  . Alcohol use: No  . Drug use: No  . Sexual activity: Not on file  Other Topics Concern  . Not on file  Social History Narrative   Married to [employer] at [employer].   Son, [name] married   Son, [name], Married.    Caffeine use: 1 cup coffee per day   Social Determinants of Health   Financial Resource Strain:   . Difficulty of Paying Living Expenses: Not on file  Food Insecurity:   . Worried About Charity fundraiser in the Last Year: Not on file  . Ran Out of Food in the Last Year: Not on file  Transportation Needs:   . Lack of Transportation (Medical): Not on file  . Lack of Transportation (Non-Medical): Not on file  Physical Activity:   . Days of Exercise per Week: Not on file  . Minutes of Exercise per Session: Not on file  Stress:   . Feeling of Stress : Not on file  Social Connections:   . Frequency of Communication with Friends and Family: Not on file  . Frequency of Social Gatherings with Friends and Family: Not on file  . Attends Religious Services: Not on file  . Active Member of Clubs or Organizations: Not on file  . Attends Archivist Meetings: Not on file  . Marital Status: Not on file  Intimate Partner Violence:   . Fear of Current or Ex-Partner: Not on file  . Emotionally Abused: Not on file  . Physically Abused: Not on file  . Sexually Abused: Not on file    FAMILY HISTORY: Family History  Problem Relation Age of Onset  . Liver cancer Sister   . Heart failure Brother 44       CABGx4  . Heart attack Mother 46  . Uterine cancer Mother   . Heart attack Father 19  . Asthma Son   . Diabetes Brother   . Prostate cancer Brother     ALLERGIES:  has No Known Allergies.  MEDICATIONS:   Current Outpatient Medications  Medication Sig Dispense Refill  . acetaminophen (TYLENOL) 325 MG tablet Take 2 tablets (650 mg total) by mouth every 6 (six) hours as needed for mild pain, moderate pain or fever.    Marland Kitchen allopurinol (ZYLOPRIM) 300 MG tablet Take 1 tablet (300 mg total) by mouth daily. 30 tablet 1  . feeding supplement, ENSURE ENLIVE, (ENSURE ENLIVE) LIQD Take 237 mLs by mouth 2 (two) times daily between meals. 237 mL 12  .  hydrocortisone (ANUSOL-HC) 2.5 % rectal cream Place 1 application rectally daily as needed. (Patient taking differently: Place 1 application rectally daily as needed for hemorrhoids. ) 30 g 0  . losartan (COZAAR) 25 MG tablet Take 25 mg by mouth daily.     . Multiple Vitamins-Minerals (MULTIVITAMINS THER. W/MINERALS) TABS Take 1 tablet by mouth daily.      . ondansetron (ZOFRAN ODT) 8 MG disintegrating tablet Take 1 tablet (8 mg total) by mouth every 8 (eight) hours as needed for nausea or vomiting. 30 tablet 5  . pantoprazole (PROTONIX) 40 MG tablet Take 1 tablet (40 mg total) by mouth 2 (two) times daily. 60 tablet 5  . polyethylene glycol (MIRALAX / GLYCOLAX) 17 g packet Take 17 g by mouth daily as needed for moderate constipation. 14 each 0  . prochlorperazine (COMPAZINE) 10 MG tablet Take 1 tablet (10 mg total) by mouth every 6 (six) hours as needed for nausea or vomiting. 30 tablet 0  . promethazine (PHENERGAN) 25 MG tablet Take 1 tablet (25 mg total) by mouth every 6 (six) hours as needed for nausea or vomiting. 45 tablet 2  . rosuvastatin (CRESTOR) 5 MG tablet Take 5 mg by mouth daily.     Marland Kitchen senna-docusate (SENOKOT-S) 8.6-50 MG tablet Take 2 tablets by mouth 2 (two) times daily as needed for mild constipation.    . Sodium Chloride-Sodium Bicarb (SODIUM BICARBONATE/SODIUM CHLORIDE) SOLN 1 application by Mouth Rinse route as needed for dry mouth.    . sucralfate (CARAFATE) 1 g tablet Take 1 tablet (1 g total) by mouth 4 (four) times daily -  with meals and at  bedtime. 60 tablet 3   No current facility-administered medications for this visit.    REVIEW OF SYSTEMS:   Constitutional: ( - ) fevers, ( - )  chills , ( - ) night sweats Eyes: ( - ) blurriness of vision, ( - ) double vision, ( - ) watery eyes Ears, nose, mouth, throat, and face: ( - ) mucositis, ( - ) sore throat Respiratory: ( - ) cough, ( - ) dyspnea, ( - ) wheezes Cardiovascular: ( - ) palpitation, ( - ) chest discomfort, ( - ) lower extremity swelling Gastrointestinal:  ( - ) nausea, ( - ) heartburn, ( - ) change in bowel habits Skin: ( - ) abnormal skin rashes Lymphatics: ( - ) new lymphadenopathy, ( - ) easy bruising Neurological: ( - ) numbness, ( - ) tingling, ( - ) new weaknesses Behavioral/Psych: ( - ) mood change, ( - ) new changes  All other systems were reviewed with the patient and are negative.  PHYSICAL EXAMINATION: ECOG PERFORMANCE STATUS: 1 - Symptomatic but completely ambulatory  Vitals:   06/25/20 0819  BP: 110/67  Pulse: 80  Resp: 16  Temp: 98 F (36.7 C)  SpO2: 100%   Filed Weights   06/25/20 0819  Weight: 160 lb 12.8 oz (72.9 kg)    GENERAL: well appearing elderly Caucasian male. alert, no distress and comfortable SKIN: skin color, texture, turgor are normal, no rashes or significant lesions EYES: conjunctiva are pink and non-injected, sclera clear LUNGS: clear to auscultation and percussion with normal breathing effort HEART: regular rate & rhythm and no murmurs and no lower extremity edema ABDOMEN: soft, non-tender, non-distended, normal bowel sounds. Abdominal mass is not palpable.  Musculoskeletal: no cyanosis of digits and no clubbing  PSYCH: alert & oriented x 3, fluent speech NEURO: no focal motor/sensory deficits  LABORATORY DATA:  I have reviewed the data as listed CBC Latest Ref Rng & Units 06/23/2020 06/20/2020 06/19/2020  WBC 4.0 - 10.5 K/uL 6.0 10.9(H) 18.8(H)  Hemoglobin 13.0 - 17.0 g/dL 9.6(L) 9.0(L) 8.6(L)  Hematocrit 39 - 52 %  28.8(L) 28.2(L) 27.0(L)  Platelets 150 - 400 K/uL 325 354 348    CMP Latest Ref Rng & Units 06/23/2020 06/20/2020 06/19/2020  Glucose 70 - 99 mg/dL 120(H) 109(H) 149(H)  BUN 8 - 23 mg/dL 25(H) 30(H) 31(H)  Creatinine 0.61 - 1.24 mg/dL 0.99 1.00 1.06  Sodium 135 - 145 mmol/L 140 143 141  Potassium 3.5 - 5.1 mmol/L 3.6 3.8 3.8  Chloride 98 - 111 mmol/L 104 107 108  CO2 22 - 32 mmol/L 28 27 25   Calcium 8.9 - 10.3 mg/dL 8.7(L) 8.3(L) 8.3(L)  Total Protein 6.5 - 8.1 g/dL 5.4(L) 5.0(L) 4.9(L)  Total Bilirubin 0.3 - 1.2 mg/dL 0.9 0.6 0.6  Alkaline Phos 38 - 126 U/L 60 51 53  AST 15 - 41 U/L 16 26 26   ALT 0 - 44 U/L 40 55(H) 49(H)    RADIOGRAPHIC STUDIES: I have personally reviewed the radiological images as listed and agreed with the findings in the report: large abdominal mass consistent with biopsy proven DLBCL. Bulky tumor.  No results found.  ASSESSMENT & PLAN SHERMON BOZZI 74 y.o. male with medical history significant for Stage I DLBCL who presents for a follow up visit.  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  At this time I think he continues to be an excellent candidate for chemotherapy treatment.   On exam today Mr. Lecomte notes he is tolerating chemotherapy well.  His blood counts are currently rebounding and he is tolerating treatment without any difficulty.  He is willing and able to proceed forward chemotherapy and maintains his excellent attitude.  Appetite is good and he is having no other concerning symptoms.  The current plan is for 6 cycles of R-EPOCH chemotherapy to be administered in the inpatient setting. He is currently s/p Cycle 2 with a tentative start date of Cycle 3 on 07/07/2020.   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 --findings consistent with a double HIT lymphoma. Treatment of choice is R-EPOCH chemotherapy in the inpatient setting. --he is currently s/p Cycle 2 administered on 06/16/2020 --patient has a port in place. TTE was performed and showed strong baseline heart function. --plan for Cycle 3 of R-EPOCH to start on 07/07/2020. Direct admission to the hospital to be planned for that time.   #Symptom Management --continue allopurinol 300mg  PO daily  --prescribed ondansetron 8mg  PO q8H PRN and compazine 10mg  PO q6H prn for nausea --continue Protonix in the setting of high dose steroids --encourage daily BM, prescribed senna docusate and miralax.   No orders of the defined types were placed in this encounter.   All questions were answered. The patient knows to call the clinic with any problems, questions or concerns.  A total of more than 30 minutes were spent on this encounter and over half of that time was spent on counseling and coordination of care as outlined above.   [name], MD Department of Hematology/Oncology [facility] at [facility]  Hospital Phone: [phone] Pager: [phone] Email: [email or web address]  06/25/2020 8:41 AM

## 2020-07-04 ENCOUNTER — Telehealth: Payer: Self-pay | Admitting: *Deleted

## 2020-07-04 NOTE — Telephone Encounter (Signed)
TCT patient and spoke with his wife, [name]. Advised that he is set up for in patient admission to [facility] on 07/07/20. Bed Placement aware  Covid test scheduled for 07/05/20 at 10:10 am Email sent to in patient chemotherapy team. Wife made aware of the above. She understands that Bed Placement @ [facility] will call them Monday, 07/07/20 for pt's arrival time.  High priority scheduling message sent for appt for Rituxan and Neulasta injection for 07/14/20

## 2020-07-05 ENCOUNTER — Other Ambulatory Visit (HOSPITAL_COMMUNITY)
Admission: RE | Admit: 2020-07-05 | Discharge: 2020-07-05 | Disposition: A | Discharge status: Home / Self Care | Payer: Medicare PPO | Source: Ambulatory Visit | Attending: Hematology and Oncology | Admitting: Hematology and Oncology

## 2020-07-05 DIAGNOSIS — Z20822 Contact with and (suspected) exposure to covid-19: Secondary | ICD-10-CM | POA: Insufficient documentation

## 2020-07-05 DIAGNOSIS — Z01812 Encounter for preprocedural laboratory examination: Secondary | ICD-10-CM | POA: Insufficient documentation

## 2020-07-05 LAB — SARS CORONAVIRUS 2 (TAT 6-24 HRS): SARS Coronavirus 2: NEGATIVE

## 2020-07-07 ENCOUNTER — Other Ambulatory Visit: Payer: Self-pay | Admitting: Hematology and Oncology

## 2020-07-07 ENCOUNTER — Inpatient Hospital Stay (HOSPITAL_COMMUNITY)
Admission: AD | Admit: 2020-07-07 | Discharge: 2020-07-11 | DRG: 847 | Disposition: A | Discharge status: Home / Self Care | Payer: Medicare PPO | Source: Ambulatory Visit | Attending: Hematology and Oncology | Admitting: Hematology and Oncology

## 2020-07-07 ENCOUNTER — Encounter (HOSPITAL_COMMUNITY): Payer: Self-pay | Admitting: Hematology and Oncology

## 2020-07-07 DIAGNOSIS — C8333 Diffuse large B-cell lymphoma, intra-abdominal lymph nodes: Secondary | ICD-10-CM | POA: Diagnosis present

## 2020-07-07 DIAGNOSIS — N183 Chronic kidney disease, stage 3 unspecified: Secondary | ICD-10-CM | POA: Diagnosis present

## 2020-07-07 DIAGNOSIS — Z20822 Contact with and (suspected) exposure to covid-19: Secondary | ICD-10-CM | POA: Diagnosis not present

## 2020-07-07 DIAGNOSIS — C8330 Diffuse large B-cell lymphoma, unspecified site: Secondary | ICD-10-CM | POA: Diagnosis present

## 2020-07-07 DIAGNOSIS — Z5111 Encounter for antineoplastic chemotherapy: Principal | ICD-10-CM

## 2020-07-07 DIAGNOSIS — Z96652 Presence of left artificial knee joint: Secondary | ICD-10-CM | POA: Diagnosis present

## 2020-07-07 DIAGNOSIS — K219 Gastro-esophageal reflux disease without esophagitis: Secondary | ICD-10-CM | POA: Diagnosis not present

## 2020-07-07 DIAGNOSIS — E785 Hyperlipidemia, unspecified: Secondary | ICD-10-CM | POA: Diagnosis present

## 2020-07-07 DIAGNOSIS — Z79899 Other long term (current) drug therapy: Secondary | ICD-10-CM | POA: Diagnosis not present

## 2020-07-07 DIAGNOSIS — Z85820 Personal history of malignant melanoma of skin: Secondary | ICD-10-CM | POA: Diagnosis not present

## 2020-07-07 DIAGNOSIS — Z8673 Personal history of transient ischemic attack (TIA), and cerebral infarction without residual deficits: Secondary | ICD-10-CM

## 2020-07-07 DIAGNOSIS — C8338 Diffuse large B-cell lymphoma, lymph nodes of multiple sites: Secondary | ICD-10-CM | POA: Diagnosis not present

## 2020-07-07 DIAGNOSIS — I129 Hypertensive chronic kidney disease with stage 1 through stage 4 chronic kidney disease, or unspecified chronic kidney disease: Secondary | ICD-10-CM | POA: Diagnosis present

## 2020-07-07 LAB — CBC WITH DIFFERENTIAL/PLATELET
Abs Immature Granulocytes: 0.57 10*3/uL — ABNORMAL HIGH (ref 0.00–0.07)
Basophils Absolute: 0.1 10*3/uL (ref 0.0–0.1)
Basophils Relative: 1 %
Eosinophils Absolute: 0.1 10*3/uL (ref 0.0–0.5)
Eosinophils Relative: 1 %
HCT: 29.3 % — ABNORMAL LOW (ref 39.0–52.0)
Hemoglobin: 9.3 g/dL — ABNORMAL LOW (ref 13.0–17.0)
Immature Granulocytes: 5 %
Lymphocytes Relative: 8 %
Lymphs Abs: 1 10*3/uL (ref 0.7–4.0)
MCH: 29.3 pg (ref 26.0–34.0)
MCHC: 31.7 g/dL (ref 30.0–36.0)
MCV: 92.4 fL (ref 80.0–100.0)
Monocytes Absolute: 1 10*3/uL (ref 0.1–1.0)
Monocytes Relative: 8 %
Neutro Abs: 10 10*3/uL — ABNORMAL HIGH (ref 1.7–7.7)
Neutrophils Relative %: 77 %
Platelets: 294 10*3/uL (ref 150–400)
RBC: 3.17 MIL/uL — ABNORMAL LOW (ref 4.22–5.81)
RDW: 19.9 % — ABNORMAL HIGH (ref 11.5–15.5)
WBC: 12.7 10*3/uL — ABNORMAL HIGH (ref 4.0–10.5)
nRBC: 0 % (ref 0.0–0.2)

## 2020-07-07 LAB — COMPREHENSIVE METABOLIC PANEL
ALT: 39 U/L (ref 0–44)
AST: 27 U/L (ref 15–41)
Albumin: 3.5 g/dL (ref 3.5–5.0)
Alkaline Phosphatase: 85 U/L (ref 38–126)
Anion gap: 9 (ref 5–15)
BUN: 36 mg/dL — ABNORMAL HIGH (ref 8–23)
CO2: 25 mmol/L (ref 22–32)
Calcium: 8.7 mg/dL — ABNORMAL LOW (ref 8.9–10.3)
Chloride: 105 mmol/L (ref 98–111)
Creatinine, Ser: 1.18 mg/dL (ref 0.61–1.24)
GFR, Estimated: >60 mL/min (ref >60)
Glucose, Bld: 131 mg/dL — ABNORMAL HIGH (ref 70–99)
Potassium: 4.2 mmol/L (ref 3.5–5.1)
Sodium: 139 mmol/L (ref 135–145)
Total Bilirubin: 0.4 mg/dL (ref 0.3–1.2)
Total Protein: 6.2 g/dL — ABNORMAL LOW (ref 6.5–8.1)

## 2020-07-07 LAB — LACTATE DEHYDROGENASE: LDH: 183 U/L (ref 98–192)

## 2020-07-07 LAB — URIC ACID: Uric Acid, Serum: 4.3 mg/dL (ref 3.7–8.6)

## 2020-07-07 MED ORDER — SODIUM CHLORIDE 0.9% FLUSH: 10.0000 mL | INTRAVENOUS | Status: DC | PRN

## 2020-07-07 MED ORDER — LOSARTAN POTASSIUM 50 MG PO TABS
25.0000 mg | ORAL_TABLET | Freq: Every day | ORAL | Status: DC
  Administered 2020-07-08 – 2020-07-11 (×4): 25 mg via ORAL
  Filled 2020-07-07 (×5): qty 1

## 2020-07-07 MED ORDER — ENOXAPARIN SODIUM 40 MG/0.4ML ~~LOC~~ SOLN
40.0000 mg | Freq: Every day | SUBCUTANEOUS | Status: DC
  Administered 2020-07-07 – 2020-07-10 (×4): 40 mg via SUBCUTANEOUS
  Filled 2020-07-07 (×3): qty 0.4

## 2020-07-07 MED ORDER — SODIUM BICARBONATE/SODIUM CHLORIDE MOUTHWASH
1.0000 "application " | OROMUCOSAL | Status: DC | PRN
  Filled 2020-07-07 (×2): qty 1000

## 2020-07-07 MED ORDER — SUCRALFATE 1 GM/10ML PO SUSP
1.0000 g | Freq: Three times a day (TID) | ORAL | Status: DC
  Administered 2020-07-07 – 2020-07-11 (×15): 1 g via ORAL
  Filled 2020-07-07 (×15): qty 10

## 2020-07-07 MED ORDER — CHLORHEXIDINE GLUCONATE CLOTH 2 % EX PADS
6.0000 | MEDICATED_PAD | Freq: Every day | CUTANEOUS | Status: DC
  Administered 2020-07-07 – 2020-07-11 (×5): 6 via TOPICAL

## 2020-07-07 MED ORDER — SENNOSIDES-DOCUSATE SODIUM 8.6-50 MG PO TABS
2.0000 | ORAL_TABLET | Freq: Two times a day (BID) | ORAL | Status: DC
  Administered 2020-07-07 – 2020-07-11 (×9): 2 via ORAL
  Filled 2020-07-07 (×9): qty 2

## 2020-07-07 MED ORDER — ONDANSETRON 4 MG PO TBDP: 8.0000 mg | ORAL_TABLET | Freq: Three times a day (TID) | ORAL | Status: DC | PRN

## 2020-07-07 MED ORDER — ROSUVASTATIN CALCIUM 5 MG PO TABS
5.0000 mg | ORAL_TABLET | Freq: Every day | ORAL | Status: DC
  Administered 2020-07-07 – 2020-07-11 (×5): 5 mg via ORAL
  Filled 2020-07-07 (×7): qty 1

## 2020-07-07 MED ORDER — SODIUM CHLORIDE 0.9% FLUSH
10.0000 mL | Freq: Two times a day (BID) | INTRAVENOUS | Status: DC
  Administered 2020-07-07 – 2020-07-11 (×9): 10 mL

## 2020-07-07 MED ORDER — ENSURE ENLIVE PO LIQD
237.0000 mL | Freq: Two times a day (BID) | ORAL | Status: DC
  Administered 2020-07-07 – 2020-07-11 (×6): 237 mL via ORAL

## 2020-07-07 MED ORDER — SENNOSIDES-DOCUSATE SODIUM 8.6-50 MG PO TABS: 2.0000 | ORAL_TABLET | Freq: Two times a day (BID) | ORAL | Status: DC | PRN

## 2020-07-07 MED ORDER — SODIUM CHLORIDE 0.9 % IV SOLN
Freq: Once | INTRAVENOUS | Status: AC
  Administered 2020-07-07: 18 mg via INTRAVENOUS
  Filled 2020-07-07: qty 4

## 2020-07-07 MED ORDER — ALLOPURINOL 300 MG PO TABS
300.0000 mg | ORAL_TABLET | Freq: Every day | ORAL | Status: DC
  Administered 2020-07-08 – 2020-07-11 (×4): 300 mg via ORAL
  Filled 2020-07-07 (×4): qty 1

## 2020-07-07 MED ORDER — HYDROCORTISONE 2.5 % RE CREA
1.0000 "application " | TOPICAL_CREAM | Freq: Every day | RECTAL | Status: DC | PRN
  Filled 2020-07-07: qty 28.35

## 2020-07-07 MED ORDER — VINCRISTINE SULFATE CHEMO INJECTION 1 MG/ML
Freq: Once | INTRAVENOUS | Status: AC
  Filled 2020-07-07: qty 10

## 2020-07-07 MED ORDER — POLYETHYLENE GLYCOL 3350 17 G PO PACK
17.0000 g | PACK | Freq: Every day | ORAL | Status: DC
  Administered 2020-07-07 – 2020-07-11 (×5): 17 g via ORAL
  Filled 2020-07-07 (×5): qty 1

## 2020-07-07 MED ORDER — PROCHLORPERAZINE MALEATE 10 MG PO TABS: 10.0000 mg | ORAL_TABLET | Freq: Four times a day (QID) | ORAL | Status: DC | PRN

## 2020-07-07 MED ORDER — PREDNISONE 50 MG PO TABS
120.0000 mg | ORAL_TABLET | Freq: Every day | ORAL | Status: AC
  Administered 2020-07-07 – 2020-07-11 (×5): 120 mg via ORAL
  Filled 2020-07-07: qty 2
  Filled 2020-07-07: qty 4
  Filled 2020-07-07 (×3): qty 2
  Filled 2020-07-07 (×3): qty 4
  Filled 2020-07-07: qty 2

## 2020-07-07 MED ORDER — ACETAMINOPHEN 325 MG PO TABS: 650.0000 mg | ORAL_TABLET | Freq: Four times a day (QID) | ORAL | Status: DC | PRN

## 2020-07-07 MED ORDER — ADULT MULTIVITAMIN W/MINERALS CH
1.0000 | ORAL_TABLET | Freq: Every day | ORAL | Status: DC
  Administered 2020-07-08 – 2020-07-11 (×4): 1 via ORAL
  Filled 2020-07-07 (×5): qty 1

## 2020-07-07 MED ORDER — PANTOPRAZOLE SODIUM 40 MG PO TBEC
40.0000 mg | DELAYED_RELEASE_TABLET | Freq: Two times a day (BID) | ORAL | Status: DC
  Administered 2020-07-07 – 2020-07-11 (×8): 40 mg via ORAL
  Filled 2020-07-07 (×7): qty 1

## 2020-07-07 NOTE — H&P (Addendum)
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   1) 04/17/2020:  CT A/P showed dominant nodal mass (11.5 x 10.9 cm) within the jejunal mesentery, highly suspicious for lymphoma. Additionally there are left lower lobe pleural-based pulmonary nodules 2) 04/24/2020: establish care with Dr. Lorenso Courier 3) 04/28/2020:  PET CT scan shows Large solid left mesenteric mass 13.0 cm with maximum SUV of 21.2, Deauville 5 with local hypermetabolic porta hepatis and retroperitoneal lymph nodes. Constitutes bulky Stage I disease.   Reason for Admission: double hit lymphoma, Cycle #3 EPOCH-R  HPI: JUDE NACLERIO 74 y.o. 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   On review of the previous records Mr. Wedin underwent a CT abdomen pelvis on 04/17/2020 at which time a nodal mass 11.5 x 10.9 cm was noted within the jejunal mesentery.  There were also left lower lobe pleural-based pulmonary nodules noted at that time as well as soft tissue fullness at the ileocecal valve.  Due to concern for this finding the patient was referred to oncology for further evaluation and management.  A PET scan was performed on 04/28/2020 showed a large solid left mesenteric mass 13.0 cm with maximum SUV of 21.2, Deauville 5, hypermetabolic porta hepatis and retroperitoneal lymph nodes along with small but hypermetabolic juxtadiaphragmatic/pericardial lymph nodes in the lower chest, generally Deauville 4 and Deauville 5. A biopsy of the mesenteric mass was performed on 05/08/2020 which was consistent with Large B-cell lymphoma. 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 It was recommend for him to begin chemotherapy with EPOCH-R.   In anticipation of chemotherapy, the patient had a PAC placed on 05/19/2020 and an echocardiogram completed on 05/22/2020 which showed an LVEF of 60-65%.  The patient was seen for admission today.  He continues to feel well overall.  Appetite has been good and he has not had any recurrent nausea or vomiting.  Denies mucositis.  Bowels moved well earlier today. He denies fevers and chills.  Denies chest pain, shortness of breath, cough.  Denies headaches and dizziness.  Denies bleeding.  He reports that he had a fall at home while trying to work on his generator outside.  He reports some left-sided rib discomfort which he describes as a burning sensation.  Pain is not worsened with movement.  He has been using ibuprofen at home for pain.  The patient is seen today for admission to the hospital for cycle #3 EPOCH-R.    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 lasted for about 10 minutes. Had MRI, etc with [facility] that per his report was ok (dr. Stephannie Li). Echo (4/11): EF 55-60%, no regional WMAs, normal diastolic function, mild LAE, no source of embolus  Past Surgical History:  Procedure Laterality Date   BMI 30.2 muscular  12/08/04   curretage actinic keratosis R ear  01/20/05   ETT low prob of CAD  11/21/03   excision melanoma in situ back  01/20/05   hemorrhoids sclerosed at colonoscopy  12/19/05   IR IMAGING  GUIDED PORT INSERTION  05/19/2020   LAPAROSCOPIC INGUINAL HERNIA REPAIR Left 12/20/1995   LUMBAR DISC SURGERY     NERVE REPAIR     back of neck   retinal laser surgery Left 09/20/1986   SEPTOPLASTY  11/19/98   TOTAL KNEE ARTHROPLASTY Left 12/13/2016   Procedure: TOTAL KNEE ARTHROPLASTY WITH RIGHT KNEE CORTISONE INJECTION;  Surgeon: Frederik Pear, MD;  Location: [facility];  Service: Orthopedics;  Laterality: Left;   uric acid 6.9  12/22/04   x-ray l great toe  07/21/00       [facility] Past Surgical History:  Procedure Laterality Date   BMI 30.2 muscular  12/08/04   curretage actinic keratosis R ear  01/20/05   ETT low prob of CAD  11/21/03   excision melanoma in situ back  01/20/05   hemorrhoids sclerosed at colonoscopy  12/19/05   IR IMAGING GUIDED PORT INSERTION  05/19/2020   LAPAROSCOPIC INGUINAL HERNIA REPAIR Left 12/20/1995   LUMBAR DISC SURGERY     NERVE REPAIR     back of neck   retinal laser surgery Left 09/20/1986   SEPTOPLASTY  11/19/98   TOTAL KNEE ARTHROPLASTY Left 12/13/2016   Procedure: TOTAL KNEE ARTHROPLASTY WITH RIGHT KNEE CORTISONE INJECTION;  Surgeon: Frederik Pear, MD;  Location: [facility];  Service: Orthopedics;  Laterality: Left;   uric acid 6.9  12/22/04   x-ray l great toe  07/21/00  quency Provider Last Rate Last Admin   acetaminophen (TYLENOL) tablet 650 mg  650 mg Oral Q6H PRN Curcio, Roselie Awkward, NP       allopurinol (ZYLOPRIM) tablet 300 mg  300 mg Oral Daily Curcio, Roselie Awkward, NP       Chlorhexidine Gluconate Cloth 2 % PADS 6 each  6 each Topical Daily Curcio, Kristin R, NP       enoxaparin (LOVENOX) injection 40 mg  40 mg Subcutaneous Q24H Curcio, Roselie Awkward, NP       feeding supplement (ENSURE ENLIVE / ENSURE PLUS) liquid 237 mL  237 mL Oral BID BM Curcio, Roselie Awkward, NP       hydrocortisone (ANUSOL-HC) 2.5 % rectal cream 1 application  1 application Rectal Daily PRN Maryanna Shape, NP       losartan (COZAAR) tablet 25 mg  25 mg Oral Daily Curcio, Kristin R, NP       multivitamins ther.  w/minerals tablet 1 tablet  1 tablet Oral Daily Curcio, Kristin R, NP       ondansetron (ZOFRAN-ODT) disintegrating tablet 8 mg  8 mg Oral Q8H PRN Curcio, Roselie Awkward, NP       pantoprazole (PROTONIX) EC tablet 40 mg  40 mg Oral BID Curcio, Kristin R, NP       polyethylene glycol (MIRALAX / GLYCOLAX) packet 17 g  17 g Oral Daily Curcio, Kristin R, NP       prochlorperazine (COMPAZINE) tablet 10 mg  10 mg Oral Q6H PRN [name]  R, NP       rosuvastatin (CRESTOR) tablet 5 mg  5 mg Oral Daily Curcio, Roselie Awkward, NP       senna-docusate (Senokot-S) tablet 2 tablet  2 tablet Oral BID Curcio, Roselie Awkward, NP       sodium bicarbonate/sodium chloride mouthwash 2355DD  1 application Mouth Rinse PRN Curcio, Roselie Awkward, NP       sodium chloride flush (NS) 0.9 % injection 10-40 mL  10-40 mL Intracatheter Q12H Curcio, Kristin R, NP       sodium chloride flush (NS) 0.9 % injection 10-40 mL  10-40 mL Intracatheter PRN Curcio, Roselie Awkward, NP       sucralfate (CARAFATE) 1 GM/10ML suspension 1 g  1 g Oral TID WC & HS Curcio, Roselie Awkward, NP           Family History  Problem Relation Age of Onset   Liver cancer Sister    Heart failure Brother 34       CABGx4   Heart attack Mother 49   Uterine cancer Mother    Heart attack Father 56   Asthma Son    Diabetes Brother    Prostate cancer Brother   Relation Age of Onset   Liver cancer Sister    Heart failure Brother 65       CABGx4   Heart attack Mother 61   Uterine cancer Mother    Heart attack Father 22   Asthma Son    Diabetes Brother    Prostate cancer Brother      Socioeconomic History   Marital status: Married    Spouse name: [name]   Number of children: 2   Years of education: 12   Highest education level: Not on file  Occupational History   Occupation: Systems developer: [employer]  Tobacco Use   Smoking status: Never Smoker   Smokeless tobacco: Never Used  Scientific laboratory technician Use: Never used  Substance and Sexual  Activity   Alcohol use: No   Drug use: No   Sexual activity: Not on file  Other Topics Concern   Not on file  Social History Narrative   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   Son, [name] married   Son, [name], Married.    Caffeine use: 1 cup coffee per day   Social Determinants of Health   Financial Resource Strain:    Difficulty of Paying Living Expenses: Not on file  Food Insecurity:    Worried About Charity fundraiser in the Last Year: Not on file   [employer] of Food in the Last Year: Not on file  Transportation Needs:    Lack of Transportation (Medical): Not on file   Lack of Transportation (Non-Medical): Not on file  Physical Activity:    Days of Exercise per Week: Not on file   Minutes of Exercise per Session: Not on file  Stress:    Feeling of Stress : Not on file  Social Connections:    Frequency of Communication with Friends and Family: Not on file   Frequency of Social Gatherings with Friends and Family: Not on file   Attends Religious Services: Not on file   Active Member of Clubs or Organizations: Not on file   Attends Archivist Meetings: Not on file   Marital Status: Not on file  Intimate Partner Violence:    Fear of Current or Ex-Partner: Not  on file   Emotionally Abused: Not on file   Physically Abused: Not on file   Sexually Abused: Not on file  :  Review of Systems: A comprehensive 14 point review of systems was negative except as noted in the HPI.  Exam: Patient Vitals for the past 24 hrs:  BP Temp Temp src Pulse Resp SpO2  07/07/20 1050 (!) 144/64 98 F (36.7 C) Oral (!) 57 13 100 %    General:  well-nourished in no acute distress.   Eyes:  no scleral icterus.   ENT:  There were no oropharyngeal lesions.   Neck was without thyromegaly.   Lymphatics:  Negative cervical, supraclavicular or axillary adenopathy.   Respiratory: lungs were clear bilaterally without wheezing or crackles.   Cardiovascular:  Regular rate  and rhythm, S1/S2, without murmur, rub or gallop.  There was no pedal edema.   GI:  Positive bowel sounds.  I was unable to palpate the mass in the left upper quadrant today.  Musculoskeletal:  no spinal tenderness of palpation of vertebral spine.  No pain with palpation over the left ribs. Skin exam was without echymosis, petichae.   Neuro exam was nonfocal. Patient was alert and oriented.  Attention was good.   Language was appropriate.  Mood was normal without depression.  Speech was not pressured.  Thought content was not tangential.     Lab Results  Component Value Date   WBC 6.0 06/23/2020   HGB 9.6 (L) 06/23/2020   HCT 28.8 (L) 06/23/2020   PLT 325 06/23/2020   GLUCOSE 120 (H) 06/23/2020   CHOL  12/31/2009    160        ATP III CLASSIFICATION:  <200     mg/dL   Desirable  200-239  mg/dL   Borderline High  >=240    mg/dL   High          TRIG 213 (H) 12/31/2009   HDL 28 (L) 12/31/2009   LDLCALC  12/31/2009    89        Total Cholesterol/HDL:CHD Risk Coronary Heart Disease Risk Table                     Men   Women  1/2 Average Risk   3.4   3.3  Average Risk       5.0   4.4  2 X Average Risk   9.6   7.1  3 X Average Risk  23.4   11.0        Use the calculated Patient Ratio above and the CHD Risk Table to determine the patient's CHD Risk.        ATP III CLASSIFICATION (LDL):  <100     mg/dL   Optimal  100-129  mg/dL   Near or Above                    Optimal  130-159  mg/dL   Borderline  160-189  mg/dL   High  >190     mg/dL   Very High   ALT 40 06/23/2020   AST 16 06/23/2020   NA 140 06/23/2020   K 3.6 06/23/2020   CL 104 06/23/2020   CREATININE 0.99 06/23/2020   BUN 25 (H) 06/23/2020   CO2 28 06/23/2020    No results found.   No results found.  Assessment and Plan:  TAVONTE SEYBOLD 74 y.o. male with medical history significant for Stage I DLBCL who  presents for a follow up visit.  After review the labs, review the imaging, review the pathology and  discussion with the patient the findings are most consistent with a stage I diffuse large B-cell lymphoma predominantly involving the lymph nodes of the abdomen, double hit lymphoma with rearrangements of BCL-2 and MYC. Recommend for him to proceed with EPOCH-R as an inpatient.  He is here to begin cycle #3 today.   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 Stage I bulky disease.   --Recommend EPOCH-R -begin cycle #3 on 07/07/2020. --Adverse effects have been reviewed including, but not limited to, alopecia, myelosuppression, peripheral neuropathy, mucositis, liver/renal dysfunction, and possible infusion reaction associated with Rituxan. He agrees to proceed. --PAC in place and ready for use. --Echocardiogram from 05/22/2020 shows LVEF of 60-65%  --CBC with differential, CMET, LDH, and uric acid are pending today.  Will review prior to initiating chemotherapy. --Will monitor closely with daily CBC with diff, CMET, LDH, and uric acid --Continue allopurinol 300 mg daily.  --Lovenox for DVT prophylaxis. --Continue home medications for HTN and hyperlipidemia.  --Continue PRN antiemetics. --He has scheduled MiraLAX and Senokot-S 2 tablets twice a day. --K pad has been ordered as needed to apply heat to left ribs.  He has Tylenol ordered as needed.   The patient will need to be scheduled for Rituxan and Neulasta on Monday, 07/14/2020.  I have sent a high priority scheduling message to arrange this appointment.  He will also need labs and follow-up with Dr. Lorenso Courier and timing of this appointment will be discussed with Dr. Lorenso Courier.  [name], DNP, AGPCNP-BC, AOCNP   I have read the above note and personally examined the patient. I agree with the assessment and plan as noted above.  Briefly Mr. Kenney Going is a 74 year old male with medical history significant  for double hit DLBCL who presents for Cycle 3 of R-EPOCH. In the interim since his last visit he has been well other than a mechanical fall he had last week while working outside. He has some mild residual pain on this left side, but is overall well. He has not had any fevers, chills, sweats, nausea, or vomiting. Appetite has been good and bowels are moving well. He is ready and able to proceed with Cycle 3.  OK to proceed with Cycle 3 of R-EPOCH. Today is Cycle 3 Day 1.    [name], MD Department of Hematology/Oncology [place] [facility] Phone: [phone] Pager: [phone] Email: [email or web address]

## 2020-07-07 NOTE — Progress Notes (Signed)
The following biosimilar Ruxience (rituximab-pvvr) has been selected for use in this patient.  As per 06/23/20 infusion note, plan rapid Ruxience. Pt tolerated 1st Ruxience on 06/23/20.  Kennith Center, Pharm.D., CPP 07/07/2020@1 :08 PM

## 2020-07-08 ENCOUNTER — Other Ambulatory Visit: Payer: Self-pay

## 2020-07-08 DIAGNOSIS — C8338 Diffuse large B-cell lymphoma, lymph nodes of multiple sites: Secondary | ICD-10-CM

## 2020-07-08 DIAGNOSIS — Z79899 Other long term (current) drug therapy: Secondary | ICD-10-CM | POA: Diagnosis not present

## 2020-07-08 LAB — CBC WITH DIFFERENTIAL/PLATELET
Abs Immature Granulocytes: 0.92 10*3/uL — ABNORMAL HIGH (ref 0.00–0.07)
Basophils Absolute: 0 10*3/uL (ref 0.0–0.1)
Basophils Relative: 0 %
Eosinophils Absolute: 0 10*3/uL (ref 0.0–0.5)
Eosinophils Relative: 0 %
HCT: 28 % — ABNORMAL LOW (ref 39.0–52.0)
Hemoglobin: 8.8 g/dL — ABNORMAL LOW (ref 13.0–17.0)
Immature Granulocytes: 5 %
Lymphocytes Relative: 4 %
Lymphs Abs: 0.8 10*3/uL (ref 0.7–4.0)
MCH: 29 pg (ref 26.0–34.0)
MCHC: 31.4 g/dL (ref 30.0–36.0)
MCV: 92.4 fL (ref 80.0–100.0)
Monocytes Absolute: 0.1 10*3/uL (ref 0.1–1.0)
Monocytes Relative: 1 %
Neutro Abs: 16.5 10*3/uL — ABNORMAL HIGH (ref 1.7–7.7)
Neutrophils Relative %: 90 %
Platelets: 327 10*3/uL (ref 150–400)
RBC: 3.03 MIL/uL — ABNORMAL LOW (ref 4.22–5.81)
RDW: 19.8 % — ABNORMAL HIGH (ref 11.5–15.5)
WBC: 18.4 10*3/uL — ABNORMAL HIGH (ref 4.0–10.5)
nRBC: 0 % (ref 0.0–0.2)

## 2020-07-08 LAB — COMPREHENSIVE METABOLIC PANEL
ALT: 34 U/L (ref 0–44)
AST: 22 U/L (ref 15–41)
Albumin: 3.2 g/dL — ABNORMAL LOW (ref 3.5–5.0)
Alkaline Phosphatase: 75 U/L (ref 38–126)
Anion gap: 8 (ref 5–15)
BUN: 31 mg/dL — ABNORMAL HIGH (ref 8–23)
CO2: 24 mmol/L (ref 22–32)
Calcium: 8.8 mg/dL — ABNORMAL LOW (ref 8.9–10.3)
Chloride: 107 mmol/L (ref 98–111)
Creatinine, Ser: 1.03 mg/dL (ref 0.61–1.24)
GFR, Estimated: >60 mL/min (ref >60)
Glucose, Bld: 173 mg/dL — ABNORMAL HIGH (ref 70–99)
Potassium: 4.3 mmol/L (ref 3.5–5.1)
Sodium: 139 mmol/L (ref 135–145)
Total Bilirubin: 0.7 mg/dL (ref 0.3–1.2)
Total Protein: 5.9 g/dL — ABNORMAL LOW (ref 6.5–8.1)

## 2020-07-08 LAB — LACTATE DEHYDROGENASE: LDH: 159 U/L (ref 98–192)

## 2020-07-08 LAB — URIC ACID: Uric Acid, Serum: 4.2 mg/dL (ref 3.7–8.6)

## 2020-07-08 MED ORDER — PROCHLORPERAZINE MALEATE 10 MG PO TABS: 10.0000 mg | ORAL_TABLET | Freq: Four times a day (QID) | ORAL | Status: DC | PRN

## 2020-07-08 MED ORDER — VINCRISTINE SULFATE CHEMO INJECTION 1 MG/ML
Freq: Once | INTRAVENOUS | Status: AC
  Filled 2020-07-08: qty 10

## 2020-07-08 MED ORDER — SODIUM CHLORIDE 0.9 % IV SOLN
Freq: Once | INTRAVENOUS | Status: AC
  Administered 2020-07-08: 18 mg via INTRAVENOUS
  Filled 2020-07-08: qty 4

## 2020-07-08 MED ORDER — ORAL CARE MOUTH RINSE
15.0000 mL | Freq: Two times a day (BID) | OROMUCOSAL | Status: DC
  Administered 2020-07-08 – 2020-07-11 (×7): 15 mL via OROMUCOSAL

## 2020-07-08 NOTE — Progress Notes (Addendum)
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   1) 04/17/2020: CT A/P showed dominant nodal mass(11.5 x 10.9 cm)within the jejunal mesentery, highly suspicious for lymphoma. Additionally there are left lower lobe pleural-based pulmonary nodules 2) 04/24/2020: establish care with Dr. Lorenso Courier 3) 04/28/2020:  PET CT scan shows Large solid left mesenteric mass 13.0 cm with maximum SUV of 21.2, [place] 5 with local hypermetabolic porta hepatis and retroperitoneal lymph nodes. Constitutes bulky Stage I disease.   Reason for Admission: double hit lymphoma, Cycle #3 EPOCH-R  HPI: Mr. Pollack is a 74 year old male with medical history significant for double hit DLBCL. He presents for Cycle 3 of R-EPOCH chemotherapy. Risks and benefits have been discussed and he has agreed to proceed  Interval History: The patient tolerated day one of his chemotherapy well overall.  He states that he did not sleep well last night secondary to prednisone.  He denies mucositis, nausea, vomiting today.  States that his bowels are moving well.  He remains on MiraLAX and Senokot-S.  He continues to maintain a good energy level and is ambulating in the hallway without any difficulty.  Left rib pain is very minimal.   Diagnosis Date  . Arthritis   . Chronic kidney disease (CKD), stage III (moderate)   . COVID-19   . GERD (gastroesophageal reflux disease)    occ  . Hemorrhoids   . History of ETT 3/05   low risk  . History of hiatal hernia   . History of kidney stones   . HLD (hyperlipidemia)   . HTN (hypertension)   . Left inguinal hernia   . Melanoma ([place])    excied  . 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 Stroke [facility]) 2011   tia   . TIA (transient ischemic  attack) 4/11   associated w slurred speecha ndmild facial droop. lasted for about 10 minutes. Had MRI, etc with [facility] that per his report was ok (dr. Stephannie Li). Echo (4/11): EF 55-60%, no regional WMAs, normal diastolic function, mild LAE, no source of embolus  Past Surgical History:  Procedure Laterality Date  . BMI 30.2 muscular  12/08/04  . curretage actinic keratosis R ear  01/20/05  . ETT low prob of CAD  11/21/03  . excision melanoma in situ back  01/20/05  . hemorrhoids sclerosed at colonoscopy  12/19/05  . IR IMAGING GUIDED PORT INSERTION  05/19/2020  . LAPAROSCOPIC INGUINAL HERNIA REPAIR Left 12/20/1995  . LUMBAR DISC SURGERY    . NERVE REPAIR     back of neck  . retinal laser surgery Left 09/20/1986  . SEPTOPLASTY  11/19/98  . TOTAL KNEE ARTHROPLASTY Left 12/13/2016   Procedure: TOTAL KNEE ARTHROPLASTY WITH RIGHT KNEE CORTISONE INJECTION;  Surgeon: Frederik Pear, MD;  Location: [place];  Service: Orthopedics;  Laterality: Left;  . uric acid 6.9  12/22/04  . x-ray l great toe  07/21/00       Fre Past Surgical History:  Procedure Laterality Date  . BMI 30.2 muscular  12/08/04  . curretage actinic keratosis R ear  01/20/05  . ETT low prob of CAD  11/21/03  . excision melanoma in situ back  01/20/05  . hemorrhoids sclerosed at colonoscopy  12/19/05  . IR IMAGING GUIDED PORT  INSERTION  05/19/2020  . LAPAROSCOPIC INGUINAL HERNIA REPAIR Left 12/20/1995  . LUMBAR DISC SURGERY    . NERVE REPAIR     back of neck  . retinal laser surgery Left 09/20/1986  . SEPTOPLASTY  11/19/98  . TOTAL KNEE ARTHROPLASTY Left 12/13/2016   Procedure: TOTAL KNEE ARTHROPLASTY WITH RIGHT KNEE CORTISONE INJECTION;  Surgeon: Frederik Pear, MD;  Location: [place];  Service: Orthopedics;  Laterality: Left;  . uric acid 6.9  12/22/04  . x-ray l great toe  07/21/00  quency Provider Last Rate Last Admin  . 0.9 %  sodium chloride infusion   Intravenous Continuous [name] IV, MD 20 mL/hr at 05/29/20 0400 Rate Verify at  05/29/20 0400  . allopurinol (ZYLOPRIM) tablet 300 mg  300 mg Oral Daily [name] R, NP   300 mg at 05/29/20 1038  . aspirin tablet 325 mg  325 mg Oral Q4H PRN Maryanna Shape, NP      . Chlorhexidine Gluconate Cloth 2 % PADS 6 each  6 each Topical Daily Maryanna Shape, NP   6 each at 05/29/20 1038  . Cold Pack 1 packet  1 packet Topical Once PRN [name] IV, MD      . DOXOrubicin (ADRIAMYCIN) 20 mg, etoposide (VEPESID) 96 mg, vinCRIStine (ONCOVIN) 0.8 mg in sodium chloride 0.9 % 1,000 mL chemo infusion   Intravenous Once Orson Slick, MD 51 mL/hr at 05/28/20 1422 New Bag at 05/28/20 1422  . DOXOrubicin (ADRIAMYCIN) 20 mg, etoposide (VEPESID) 96 mg, vinCRIStine (ONCOVIN) 0.8 mg in sodium chloride 0.9 % 1,000 mL chemo infusion   Intravenous Once Narda Rutherford T IV, MD      . enoxaparin (LOVENOX) injection 40 mg  40 mg Subcutaneous Q24H [name] R, NP   40 mg at 05/28/20 1116  . Hot Pack 1 packet  1 packet Topical Once PRN [name] IV, MD      . hydrocortisone (ANUSOL-HC) 2.5 % rectal cream 1 application  1 application Rectal Daily PRN Maryanna Shape, NP      . losartan (COZAAR) tablet 25 mg  25 mg Oral Daily [name] R, NP   25 mg at 05/29/20 1038  . multivitamin with minerals tablet 1 tablet  1 tablet Oral Daily Maryanna Shape, NP   1 tablet at 05/29/20 1038  . ondansetron (ZOFRAN-ODT) disintegrating tablet 8 mg  8 mg Oral Q8H PRN Curcio, Kristin R, NP      . pantoprazole (PROTONIX) EC tablet 40 mg  40 mg Oral BID [name] R, NP   40 mg at 05/29/20 1038  . polyethylene glycol (MIRALAX / GLYCOLAX) packet 17 g  17 g Oral Daily PRN Narda Rutherford T IV, MD      . predniSONE (DELTASONE) tablet 120 mg  120 mg Oral Q breakfast [name] IV, MD   120 mg at 05/29/20 0819  . prochlorperazine (COMPAZINE) tablet 10 mg  10 mg Oral Q6H PRN [name] R, NP      . rosuvastatin (CRESTOR) tablet 5 mg  5 mg Oral QPM Curcio, Kristin R, NP   5 mg at  05/28/20 1744  . senna-docusate (Senokot-S) tablet 2 tablet  2 tablet Oral BID Orson Slick, MD   2 tablet at 05/29/20 1038  . sodium bicarbonate/sodium chloride mouthwash 1033ml   Mouth Rinse PRN Curcio, Kristin R, NP      . sodium chloride flush (NS) 0.9 %  injection 10-40 mL  10-40 mL Intracatheter Q12H Maryanna Shape, NP   10 mL at 05/28/20 2129  . sodium chloride flush (NS) 0.9 % injection 10-40 mL  10-40 mL Intracatheter PRN Curcio, Kristin R, NP      . sucralfate (CARAFATE) 1 GM/10ML suspension 1 g  1 g Oral TID WC & HS Curcio, Roselie Awkward, NP   1 g at 05/29/20 0865       Family History  Problem Relation Age of Onset  . Liver cancer Sister   . Heart failure Brother 44       CABGx4  . Heart attack Mother 21  . Uterine cancer Mother   . Heart attack Father 61  . Asthma Son   . Diabetes Brother   . Prostate cancer Brother   Relation Age of Onset  . Liver cancer Sister   . Heart failure Brother 44       CABGx4  . Heart attack Mother 40  . Uterine cancer Mother   . Heart attack Father 70  . Asthma Son   . Diabetes Brother   . Prostate cancer Brother      Socioeconomic History  . Marital status: Married    Spouse name: [name]  . Number of children: 2  . Years of education: 22  . Highest education level: Not on file  Occupational History  . Occupation: Systems developer: [employer]  Tobacco Use  . Smoking status: Never Smoker  . Smokeless tobacco: Never Used  Vaping Use  . Vaping Use: Never used  Substance and Sexual Activity  . Alcohol use: No  . Drug use: No  . Sexual activity: Not on file  Other Topics Concern  . Not on file  Social History Narrative   Married to [employer] at [employer].   Son, [name] married   Son, [name], Married.    Caffeine use: 1 cup coffee per day   Social Determinants of Health   Financial Resource Strain:   . Difficulty of Paying Living Expenses: Not on file  Food Insecurity:   . Worried  About Charity fundraiser in the Last Year: Not on file  . Ran Out of Food in the Last Year: Not on file  Transportation Needs:   . Lack of Transportation (Medical): Not on file  . Lack of Transportation (Non-Medical): Not on file  Physical Activity:   . Days of Exercise per Week: Not on file  . Minutes of Exercise per Session: Not on file  Stress:   . Feeling of Stress : Not on file  Social Connections:   . Frequency of Communication with Friends and Family: Not on file  . Frequency of Social Gatherings with Friends and Family: Not on file  . Attends Religious Services: Not on file  . Active Member of Clubs or Organizations: Not on file  . Attends Archivist Meetings: Not on file  . Marital Status: Not on file  Intimate Partner Violence:   . Fear of Current or Ex-Partner: Not on file  . Emotionally Abused: Not on file  . Physically Abused: Not on file  . Sexually Abused: Not on file  :  Review of Systems: A comprehensive 14 point review of systems was negative except as noted in the HPI.  Exam: Patient Vitals for the past 24 hrs:  BP Temp Temp src Pulse Resp SpO2  05/29/20 0600 107/60 98 F (36.7 C)  Oral (!) 56 20 97 %  05/28/20 2222 125/68 (!) 97.4 F (36.3 C) Oral 60 20 98 %  05/28/20 1503 113/75 (!) 97.4 F (36.3 C) Oral (!) 53 18 96 %    General:  well-nourished in no acute distress.   Eyes:  no scleral icterus.   ENT:  There were no oropharyngeal lesions.   Neck was without thyromegaly.   Lymphatics:  Negative cervical, supraclavicular or axillary adenopathy.   Respiratory: lungs were clear bilaterally without wheezing or crackles.   Cardiovascular:  Regular rate and rhythm, S1/S2, without murmur, rub or gallop.  There was no pedal edema.   GI:  Positive bowel sounds.  Left upper quadrant mass not palpable. Musculoskeletal:  no spinal tenderness of palpation of vertebral spine.   Skin exam was without echymosis, petichae.   Neuro exam was nonfocal.  Patient was alert and oriented.  Attention was good.   Language was appropriate.  Mood was normal without depression.  Speech was not pressured.  Thought content was not tangential.     Lab Results  Component Value Date   WBC 17.9 (H) 05/29/2020   HGB 9.8 (L) 05/29/2020   HCT 30.3 (L) 05/29/2020   PLT 351 05/29/2020   GLUCOSE 141 (H) 05/29/2020   CHOL  12/31/2009    160        ATP III CLASSIFICATION:  <200     mg/dL   Desirable  200-239  mg/dL   Borderline High  >=240    mg/dL   High          TRIG 213 (H) 12/31/2009   HDL 28 (L) 12/31/2009   LDLCALC  12/31/2009    89        Total Cholesterol/HDL:CHD Risk Coronary Heart Disease Risk Table                     Men   Women  1/2 Average Risk   3.4   3.3  Average Risk       5.0   4.4  2 X Average Risk   9.6   7.1  3 X Average Risk  23.4   11.0        Use the calculated Patient Ratio above and the CHD Risk Table to determine the patient's CHD Risk.        ATP III CLASSIFICATION (LDL):  <100     mg/dL   Optimal  100-129  mg/dL   Near or Above                    Optimal  130-159  mg/dL   Borderline  160-189  mg/dL   High  >190     mg/dL   Very High   ALT 34 05/29/2020   AST 29 05/29/2020   NA 139 05/29/2020   K 4.0 05/29/2020   CL 109 05/29/2020   CREATININE 1.09 05/29/2020   BUN 27 (H) 05/29/2020   CO2 24 05/29/2020    CT Biopsy  Result Date: 05/08/2020 CLINICAL DATA:  13 cm left-sided mesenteric abdominal mass. EXAM: CT GUIDED CORE BIOPSY OF MESENTERIC MASS ANESTHESIA/SEDATION: 1.5 mg IV Versed; 75 mcg IV Fentanyl Total Moderate Sedation Time:  18 minutes. The patient's level of consciousness and physiologic status were continuously monitored during the procedure by Radiology nursing. PROCEDURE: The procedure risks, benefits, and alternatives were explained to the patient. Questions regarding the procedure were encouraged and answered. The patient understands and consents to the  procedure. A time-out was performed prior  to initiating the procedure. CT was performed through the abdomen in a supine position. The left abdominal wall was prepped with chlorhexidine in a sterile fashion, and a sterile drape was applied covering the operative field. A sterile gown and sterile gloves were used for the procedure. Local anesthesia was provided with 1% Lidocaine. Under CT guidance, a 17 gauge trocar needle was advanced to the level of a left-sided mesenteric mass. After confirming needle tip position, 4 separate coaxial 18 gauge core biopsy samples were obtained and submitted in saline. Some Gel-Foam pledgets were advanced through the outer needle as the needle was retracted and removed. COMPLICATIONS: None FINDINGS: Large left-sided intra-abdominal mass within the mesentery measures up to 13.3 cm in maximum diameter. Solid tissue was obtained. IMPRESSION: CT-guided core biopsy performed of large left-sided mesenteric mass measuring just over 13 cm in estimated maximal diameter. Electronically Signed   By: Irish Lack M.D.   On: 05/08/2020 15:02   ECHOCARDIOGRAM COMPLETE  Result Date: 05/22/2020    ECHOCARDIOGRAM REPORT   Patient Name:   MATAEO INGWERSEN [facility] Date of Exam: 05/22/2020 Medical Rec #:  [id number]        Height:       68.0 in Accession #:    [id number]       Weight:       171.0 lb Date of Birth:  [birth year]        BSA:          1.912 m Patient Age:    73 years         BP:           116/77 mmHg Patient Gender: M                HR:           80 bpm. Exam Location:  Outpatient Procedure: 2D Echo, Cardiac Doppler and Color Doppler Indications:    Chemotherapy evaluation v87.41 / v58.11  History:        Patient has no prior history of Echocardiogram examinations. TIA                 and Stroke; Risk Factors:Hypertension, Dyslipidemia and GERD.  Sonographer:    Eulah Pont RDCS Referring Phys: [id number] Jackquline Denmark Ndia Sampath IV IMPRESSIONS  1. Left ventricular ejection fraction, by estimation, is 60 to 65%. The left ventricle has normal  function. The left ventricle has no regional wall motion abnormalities. Left ventricular diastolic parameters are consistent with Grade I diastolic dysfunction (impaired relaxation). The average left ventricular global longitudinal strain is -17.6 %. The global longitudinal strain is normal.  2. Right ventricular systolic function is normal. The right ventricular size is normal.  3. The mitral valve is normal in structure. No evidence of mitral valve regurgitation. No evidence of mitral stenosis.  4. The aortic valve is normal in structure. Aortic valve regurgitation is not visualized. No aortic stenosis is present.  5. The inferior vena cava is normal in size with greater than 50% respiratory variability, suggesting right atrial pressure of 3 mmHg. FINDINGS  Left Ventricle: Left ventricular ejection fraction, by estimation, is 60 to 65%. The left ventricle has normal function. The left ventricle has no regional wall motion abnormalities. The average left ventricular global longitudinal strain is -17.6 %. The global longitudinal strain is normal. The left ventricular internal cavity size was normal in size. There is no left ventricular hypertrophy. Left ventricular diastolic parameters are consistent with  Grade I diastolic dysfunction (impaired relaxation). Right Ventricle: The right ventricular size is normal. No increase in right ventricular wall thickness. Right ventricular systolic function is normal. Left Atrium: Left atrial size was normal in size. Right Atrium: Right atrial size was normal in size. Pericardium: There is no evidence of pericardial effusion. Mitral Valve: The mitral valve is normal in structure. Normal mobility of the mitral valve leaflets. No evidence of mitral valve regurgitation. No evidence of mitral valve stenosis. Tricuspid Valve: The tricuspid valve is normal in structure. Tricuspid valve regurgitation is not demonstrated. No evidence of tricuspid stenosis. Aortic Valve: The aortic valve  is normal in structure. Aortic valve regurgitation is not visualized. No aortic stenosis is present. Pulmonic Valve: The pulmonic valve was normal in structure. Pulmonic valve regurgitation is trivial. No evidence of pulmonic stenosis. Aorta: The aortic root is normal in size and structure. Venous: The inferior vena cava is normal in size with greater than 50% respiratory variability, suggesting right atrial pressure of 3 mmHg. IAS/Shunts: No atrial level shunt detected by color flow Doppler.  LEFT VENTRICLE PLAX 2D LVIDd:         5.10 cm  Diastology LVIDs:         3.20 cm  LV e' lateral:   9.94 cm/s LV PW:         0.70 cm  LV E/e' lateral: 4.6 LV IVS:        0.70 cm  LV e' medial:    6.85 cm/s LVOT diam:     2.10 cm  LV E/e' medial:  6.6 LV SV:         81 LV SV Index:   43       2D Longitudinal Strain LVOT Area:     3.46 cm 2D Strain GLS Avg:     -17.6 %  RIGHT VENTRICLE RV S prime:     9.79 cm/s TAPSE (M-mode): 1.6 cm LEFT ATRIUM             Index       RIGHT ATRIUM           Index LA diam:        3.70 cm 1.94 cm/m  RA Area:     14.40 cm LA Vol (A2C):   69.5 ml 36.35 ml/m RA Volume:   31.90 ml  16.68 ml/m LA Vol (A4C):   51.0 ml 26.67 ml/m LA Biplane Vol: 59.4 ml 31.07 ml/m  AORTIC VALVE LVOT Vmax:   143.00 cm/s LVOT Vmean:  98.900 cm/s LVOT VTI:    0.235 m  AORTA Ao Root diam: 3.20 cm Ao Asc diam:  3.40 cm MITRAL VALVE MV Area (PHT): 2.22 cm    SHUNTS MV Decel Time: 342 msec    Systemic VTI:  0.24 m MV E velocity: 45.30 cm/s  Systemic Diam: 2.10 cm MV A velocity: 72.50 cm/s MV E/A ratio:  0.62 Donato Schultz MD Electronically signed by Donato Schultz MD Signature Date/Time: 05/22/2020/12:55:40 PM    Final    IR IMAGING GUIDED PORT INSERTION  Result Date: 05/19/2020 INDICATION: History of diffuse large B-cell lymphoma. In need of durable intravenous access for chemotherapy administration EXAM: IMPLANTED PORT A CATH PLACEMENT WITH ULTRASOUND AND FLUOROSCOPIC GUIDANCE COMPARISON:  PET-CT-04/28/2020 MEDICATIONS:  Ancef 2 gm IV; The antibiotic was administered within an appropriate time interval prior to skin puncture. ANESTHESIA/SEDATION: Moderate (conscious) sedation was employed during this procedure. A total of Versed 2 mg and Fentanyl 100 mcg was administered intravenously. Moderate  Sedation Time: 25 minutes. The patient's level of consciousness and vital signs were monitored continuously by radiology nursing throughout the procedure under my direct supervision. CONTRAST:  None FLUOROSCOPY TIME:  24 seconds (5 mGy) COMPLICATIONS: None immediate. PROCEDURE: The procedure, risks, benefits, and alternatives were explained to the patient. Questions regarding the procedure were encouraged and answered. The patient understands and consents to the procedure. The right neck and chest were prepped with chlorhexidine in a sterile fashion, and a sterile drape was applied covering the operative field. Maximum barrier sterile technique with sterile gowns and gloves were used for the procedure. A timeout was performed prior to the initiation of the procedure. Local anesthesia was provided with 1% lidocaine with epinephrine. After creating a small venotomy incision, a micropuncture kit was utilized to access the internal jugular vein. Real-time ultrasound guidance was utilized for vascular access including the acquisition of a permanent ultrasound image documenting patency of the accessed vessel. The microwire was utilized to measure appropriate catheter length. A subcutaneous port pocket was then created along the upper chest wall utilizing a combination of sharp and blunt dissection. The pocket was irrigated with sterile saline. A single lumen "Slim" sized power injectable port was chosen for placement. The 8 Fr catheter was tunneled from the port pocket site to the venotomy incision. The port was placed in the pocket. The external catheter was trimmed to appropriate length. At the venotomy, an 8 Fr peel-away sheath was placed over a  guidewire under fluoroscopic guidance. The catheter was then placed through the sheath and the sheath was removed. Final catheter positioning was confirmed and documented with a fluoroscopic spot radiograph. The port was accessed with a Huber needle, aspirated and flushed with heparinized saline. The venotomy site was closed with an interrupted 4-0 Vicryl suture. The port pocket incision was closed with interrupted 2-0 Vicryl suture. The skin was opposed with a running subcuticular 4-0 Vicryl suture. Dermabond and Steri-strips were applied to both incisions. Dressings were applied. The patient tolerated the procedure well without immediate post procedural complication. FINDINGS: After catheter placement, the tip lies within the superior cavoatrial junction. The catheter aspirates and flushes normally and is ready for immediate use. IMPRESSION: Successful placement of a right internal jugular approach power injectable Port-A-Cath. The catheter is ready for immediate use. Electronically Signed   By: Sandi Mariscal M.D.   On: 05/19/2020 15:59     CT Biopsy  Result Date: 05/08/2020 CLINICAL DATA:  13 cm left-sided mesenteric abdominal mass. EXAM: CT GUIDED CORE BIOPSY OF MESENTERIC MASS ANESTHESIA/SEDATION: 1.5 mg IV Versed; 75 mcg IV Fentanyl Total Moderate Sedation Time:  18 minutes. The patient's level of consciousness and physiologic status were continuously monitored during the procedure by Radiology nursing. PROCEDURE: The procedure risks, benefits, and alternatives were explained to the patient. Questions regarding the procedure were encouraged and answered. The patient understands and consents to the procedure. A time-out was performed prior to initiating the procedure. CT was performed through the abdomen in a supine position. The left abdominal wall was prepped with chlorhexidine in a sterile fashion, and a sterile drape was applied covering the operative field. A sterile gown and sterile gloves were used  for the procedure. Local anesthesia was provided with 1% Lidocaine. Under CT guidance, a 17 gauge trocar needle was advanced to the level of a left-sided mesenteric mass. After confirming needle tip position, 4 separate coaxial 18 gauge core biopsy samples were obtained and submitted in saline. Some Gel-Foam pledgets were advanced through the  outer needle as the needle was retracted and removed. COMPLICATIONS: None FINDINGS: Large left-sided intra-abdominal mass within the mesentery measures up to 13.3 cm in maximum diameter. Solid tissue was obtained. IMPRESSION: CT-guided core biopsy performed of large left-sided mesenteric mass measuring just over 13 cm in estimated maximal diameter. Electronically Signed   By: Aletta Edouard M.D.   On: 05/08/2020 15:02   ECHOCARDIOGRAM COMPLETE  Result Date: 05/22/2020    ECHOCARDIOGRAM REPORT   Patient Name:   SIRE POET [facility] Date of Exam: 05/22/2020 Medical Rec #:  [id number]        Height:       68.0 in Accession #:    [id number]       Weight:       171.0 lb Date of Birth:  [birth year]        BSA:          1.912 m Patient Age:    62 years         BP:           116/77 mmHg Patient Gender: M                HR:           80 bpm. Exam Location:  Outpatient Procedure: 2D Echo, Cardiac Doppler and Color Doppler Indications:    Chemotherapy evaluation v87.41 / v58.11  History:        Patient has no prior history of Echocardiogram examinations. TIA                 and Stroke; Risk Factors:Hypertension, Dyslipidemia and GERD.  Sonographer:    Bernadene Person RDCS Referring Phys: [id number] [place]  1. Left ventricular ejection fraction, by estimation, is 60 to 65%. The left ventricle has normal function. The left ventricle has no regional wall motion abnormalities. Left ventricular diastolic parameters are consistent with Grade I diastolic dysfunction (impaired relaxation). The average left ventricular global longitudinal strain is -17.6 %. The global longitudinal  strain is normal.  2. Right ventricular systolic function is normal. The right ventricular size is normal.  3. The mitral valve is normal in structure. No evidence of mitral valve regurgitation. No evidence of mitral stenosis.  4. The aortic valve is normal in structure. Aortic valve regurgitation is not visualized. No aortic stenosis is present.  5. The inferior vena cava is normal in size with greater than 50% respiratory variability, suggesting right atrial pressure of 3 mmHg. FINDINGS  Left Ventricle: Left ventricular ejection fraction, by estimation, is 60 to 65%. The left ventricle has normal function. The left ventricle has no regional wall motion abnormalities. The average left ventricular global longitudinal strain is -17.6 %. The global longitudinal strain is normal. The left ventricular internal cavity size was normal in size. There is no left ventricular hypertrophy. Left ventricular diastolic parameters are consistent with Grade I diastolic dysfunction (impaired relaxation). Right Ventricle: The right ventricular size is normal. No increase in right ventricular wall thickness. Right ventricular systolic function is normal. Left Atrium: Left atrial size was normal in size. Right Atrium: Right atrial size was normal in size. Pericardium: There is no evidence of pericardial effusion. Mitral Valve: The mitral valve is normal in structure. Normal mobility of the mitral valve leaflets. No evidence of mitral valve regurgitation. No evidence of mitral valve stenosis. Tricuspid Valve: The tricuspid valve is normal in structure. Tricuspid valve regurgitation is not demonstrated. No evidence of tricuspid stenosis. Aortic Valve: The aortic  valve is normal in structure. Aortic valve regurgitation is not visualized. No aortic stenosis is present. Pulmonic Valve: The pulmonic valve was normal in structure. Pulmonic valve regurgitation is trivial. No evidence of pulmonic stenosis. Aorta: The aortic root is normal in  size and structure. Venous: The inferior vena cava is normal in size with greater than 50% respiratory variability, suggesting right atrial pressure of 3 mmHg. IAS/Shunts: No atrial level shunt detected by color flow Doppler.  LEFT VENTRICLE PLAX 2D LVIDd:         5.10 cm  Diastology LVIDs:         3.20 cm  LV e' lateral:   9.94 cm/s LV PW:         0.70 cm  LV E/e' lateral: 4.6 LV IVS:        0.70 cm  LV e' medial:    6.85 cm/s LVOT diam:     2.10 cm  LV E/e' medial:  6.6 LV SV:         81 LV SV Index:   43       2D Longitudinal Strain LVOT Area:     3.46 cm 2D Strain GLS Avg:     -17.6 %  RIGHT VENTRICLE RV S prime:     9.79 cm/s TAPSE (M-mode): 1.6 cm LEFT ATRIUM             Index       RIGHT ATRIUM           Index LA diam:        3.70 cm 1.94 cm/m  RA Area:     14.40 cm LA Vol (A2C):   69.5 ml 36.35 ml/m RA Volume:   31.90 ml  16.68 ml/m LA Vol (A4C):   51.0 ml 26.67 ml/m LA Biplane Vol: 59.4 ml 31.07 ml/m  AORTIC VALVE LVOT Vmax:   143.00 cm/s LVOT Vmean:  98.900 cm/s LVOT VTI:    0.235 m  AORTA Ao Root diam: 3.20 cm Ao Asc diam:  3.40 cm MITRAL VALVE MV Area (PHT): 2.22 cm    SHUNTS MV Decel Time: 342 msec    Systemic VTI:  0.24 m MV E velocity: 45.30 cm/s  Systemic Diam: 2.10 cm MV A velocity: 72.50 cm/s MV E/A ratio:  0.62 Candee Furbish MD Electronically signed by Candee Furbish MD Signature Date/Time: 05/22/2020/12:55:40 PM    Final    IR IMAGING GUIDED PORT INSERTION  Result Date: 05/19/2020 INDICATION: History of diffuse large B-cell lymphoma. In need of durable intravenous access for chemotherapy administration EXAM: IMPLANTED PORT A CATH PLACEMENT WITH ULTRASOUND AND FLUOROSCOPIC GUIDANCE COMPARISON:  PET-CT-04/28/2020 MEDICATIONS: Ancef 2 gm IV; The antibiotic was administered within an appropriate time interval prior to skin puncture. ANESTHESIA/SEDATION: Moderate (conscious) sedation was employed during this procedure. A total of Versed 2 mg and Fentanyl 100 mcg was administered intravenously.  Moderate Sedation Time: 25 minutes. The patient's level of consciousness and vital signs were monitored continuously by radiology nursing throughout the procedure under my direct supervision. CONTRAST:  None FLUOROSCOPY TIME:  24 seconds (5 mGy) COMPLICATIONS: None immediate. PROCEDURE: The procedure, risks, benefits, and alternatives were explained to the patient. Questions regarding the procedure were encouraged and answered. The patient understands and consents to the procedure. The right neck and chest were prepped with chlorhexidine in a sterile fashion, and a sterile drape was applied covering the operative field. Maximum barrier sterile technique with sterile gowns and gloves were used for the procedure. A timeout  was performed prior to the initiation of the procedure. Local anesthesia was provided with 1% lidocaine with epinephrine. After creating a small venotomy incision, a micropuncture kit was utilized to access the internal jugular vein. Real-time ultrasound guidance was utilized for vascular access including the acquisition of a permanent ultrasound image documenting patency of the accessed vessel. The microwire was utilized to measure appropriate catheter length. A subcutaneous port pocket was then created along the upper chest wall utilizing a combination of sharp and blunt dissection. The pocket was irrigated with sterile saline. A single lumen "Slim" sized power injectable port was chosen for placement. The 8 Fr catheter was tunneled from the port pocket site to the venotomy incision. The port was placed in the pocket. The external catheter was trimmed to appropriate length. At the venotomy, an 8 Fr peel-away sheath was placed over a guidewire under fluoroscopic guidance. The catheter was then placed through the sheath and the sheath was removed. Final catheter positioning was confirmed and documented with a fluoroscopic spot radiograph. The port was accessed with a Huber needle, aspirated and  flushed with heparinized saline. The venotomy site was closed with an interrupted 4-0 Vicryl suture. The port pocket incision was closed with interrupted 2-0 Vicryl suture. The skin was opposed with a running subcuticular 4-0 Vicryl suture. Dermabond and Steri-strips were applied to both incisions. Dressings were applied. The patient tolerated the procedure well without immediate post procedural complication. FINDINGS: After catheter placement, the tip lies within the superior cavoatrial junction. The catheter aspirates and flushes normally and is ready for immediate use. IMPRESSION: Successful placement of a right internal jugular approach power injectable Port-A-Cath. The catheter is ready for immediate use. Electronically Signed   By: Sandi Mariscal M.D.   On: 05/19/2020 15:59    Assessment and Plan:  DEZMEN ALCOCK 74 y.o. male with medical history significant for Stage I DLBCL who presents for a follow up visit.  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  He is currently receiving cycle #2 of EPOCH-R starting on 06/16/2020.  Today he is Cycle 3 Day 2 of treatment.  He is tolerating his chemotherapy well so far with no nausea or vomiting.  Bowels are moving and he has a good appetite.  There are no barriers to proceeding with treatment.      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 Stage I bulky disease.   --Continue EPOCH-R. Today is Cycle 3 Day 2 --Adverse effects have been reviewed including, but not limited to, alopecia, myelosuppression, peripheral neuropathy, mucositis, liver/renal dysfunction, and possible infusion  reaction associated with Rituxan. He agrees to proceed. --PAC in place and ready for use. --Echocardiogram from 05/22/2020 shows LVEF of 60-65%  --CBC adequate to proceed with treatment today.  Leukocytosis likely related to steroids.  Anemia slightly worse this morning.  --Will monitor closely with CBC with diff, CMET, LDH, and uric acid --continue Allopurinol 300 mg daily.  --Lovenox for DVT prophylaxis. --Continue home medications for HTN and hyperlipidemia.  --Continue PRN antiemetics. --Continue MiraLAX and Senokot.  The patient will return to the cancer center on 07/14/2020 for Rituxan and Neulasta and will have labs and  a follow-up visit on 07/16/2020.  [name], DNP, AGPCNP-BC, AOCNP Mon/Tues/Thurs/Fri 7am-5pm; Off Wednesdays Cell: [phone]  I have read the above note and personally examined the patient. I agree with the assessment and plan as noted above.  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  Today is cycle 3-day 2 of treatment.    Unfortunately he did not get good sleep partially due to the steroids and partially due to the beeping of his chemotherapy pole.  He reports that he is continued to be ambulatory and he did have 2 bowel movements yesterday, but no bowel movement yet today.  He is taking his Senokot and MiraLAX to help with that.  His appetite has been good and has had no issues with shortness of breath, fatigue, nausea, vomiting or diarrhea.  Overall he is agreeable to proceeding with treatment today and has had no other issues.  Hemoglobin is trending down we will continue to monitor this.   [name], MD Department of Hematology/Oncology [place] at [facility] Phone: [phone] Pager: [phone] Email: [email or web address]

## 2020-07-09 DIAGNOSIS — C8338 Diffuse large B-cell lymphoma, lymph nodes of multiple sites: Secondary | ICD-10-CM | POA: Diagnosis not present

## 2020-07-09 DIAGNOSIS — Z79899 Other long term (current) drug therapy: Secondary | ICD-10-CM | POA: Diagnosis not present

## 2020-07-09 LAB — CBC WITH DIFFERENTIAL/PLATELET
Abs Immature Granulocytes: 0.4 10*3/uL — ABNORMAL HIGH (ref 0.00–0.07)
Basophils Absolute: 0 10*3/uL (ref 0.0–0.1)
Basophils Relative: 0 %
Eosinophils Absolute: 0 10*3/uL (ref 0.0–0.5)
Eosinophils Relative: 0 %
HCT: 26.7 % — ABNORMAL LOW (ref 39.0–52.0)
Hemoglobin: 8.5 g/dL — ABNORMAL LOW (ref 13.0–17.0)
Immature Granulocytes: 2 %
Lymphocytes Relative: 3 %
Lymphs Abs: 0.6 10*3/uL — ABNORMAL LOW (ref 0.7–4.0)
MCH: 30 pg (ref 26.0–34.0)
MCHC: 31.8 g/dL (ref 30.0–36.0)
MCV: 94.3 fL (ref 80.0–100.0)
Monocytes Absolute: 0.9 10*3/uL (ref 0.1–1.0)
Monocytes Relative: 4 %
Neutro Abs: 22.4 10*3/uL — ABNORMAL HIGH (ref 1.7–7.7)
Neutrophils Relative %: 91 %
Platelets: 347 10*3/uL (ref 150–400)
RBC: 2.83 MIL/uL — ABNORMAL LOW (ref 4.22–5.81)
RDW: 21 % — ABNORMAL HIGH (ref 11.5–15.5)
WBC: 24.4 10*3/uL — ABNORMAL HIGH (ref 4.0–10.5)
nRBC: 0 % (ref 0.0–0.2)

## 2020-07-09 LAB — COMPREHENSIVE METABOLIC PANEL
ALT: 24 U/L (ref 0–44)
AST: 32 U/L (ref 15–41)
Albumin: 3.1 g/dL — ABNORMAL LOW (ref 3.5–5.0)
Alkaline Phosphatase: 62 U/L (ref 38–126)
Anion gap: 8 (ref 5–15)
BUN: 30 mg/dL — ABNORMAL HIGH (ref 8–23)
CO2: 23 mmol/L (ref 22–32)
Calcium: 8.4 mg/dL — ABNORMAL LOW (ref 8.9–10.3)
Chloride: 110 mmol/L (ref 98–111)
Creatinine, Ser: 1.16 mg/dL (ref 0.61–1.24)
GFR, Estimated: >60 mL/min (ref >60)
Glucose, Bld: 133 mg/dL — ABNORMAL HIGH (ref 70–99)
Potassium: 4.4 mmol/L (ref 3.5–5.1)
Sodium: 141 mmol/L (ref 135–145)
Total Bilirubin: 0.7 mg/dL (ref 0.3–1.2)
Total Protein: 5.5 g/dL — ABNORMAL LOW (ref 6.5–8.1)

## 2020-07-09 LAB — URIC ACID: Uric Acid, Serum: 4.7 mg/dL (ref 3.7–8.6)

## 2020-07-09 LAB — LACTATE DEHYDROGENASE: LDH: 285 U/L — ABNORMAL HIGH (ref 98–192)

## 2020-07-09 MED ORDER — SODIUM CHLORIDE 0.9 % IV SOLN
Freq: Once | INTRAVENOUS | Status: AC
  Administered 2020-07-09: 18 mg via INTRAVENOUS
  Filled 2020-07-09: qty 4

## 2020-07-09 MED ORDER — VINCRISTINE SULFATE CHEMO INJECTION 1 MG/ML
Freq: Once | INTRAVENOUS | Status: AC
  Filled 2020-07-09: qty 10

## 2020-07-09 NOTE — Progress Notes (Signed)
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   1) 04/17/2020: CT A/P showed dominant nodal mass(11.5 x 10.9 cm)within the jejunal mesentery, highly suspicious for lymphoma. Additionally there are left lower lobe pleural-based pulmonary nodules 2) 04/24/2020: establish care with Dr. Lorenso Courier 3) 04/28/2020:  PET CT scan shows Large solid left mesenteric mass 13.0 cm with maximum SUV of 21.2, [place] 5 with local hypermetabolic porta hepatis and retroperitoneal lymph nodes. Constitutes bulky Stage I disease.   Reason for Admission: double hit lymphoma, Cycle #3 EPOCH-R  HPI: Mr. Egerton is a 74 year old male with medical history significant for double hit DLBCL. He presents for Cycle 3 of R-EPOCH chemotherapy. Risks and benefits have been discussed and he has agreed to proceed  Interval History: --BM last night at 9:00pm. Noted some light blood on toilet paper --Hgb drop to 8.5 --no shortness of breath, fevers, chills, nausea, or vomiting --accidental disconnect of IV, resolved quickly with no loss of chemo.    Diagnosis Date  . Arthritis   . Chronic kidney disease (CKD), stage III (moderate)   . COVID-19   . GERD (gastroesophageal reflux disease)    occ  . Hemorrhoids   . History of ETT 3/05   low risk  . History of hiatal hernia   . History of kidney stones   . HLD (hyperlipidemia)   . HTN (hypertension)   . Left inguinal hernia   . Melanoma ([place])    excied  . 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 Stroke [facility]) 2011   tia   . TIA (transient ischemic attack) 4/11   associated w slurred speecha ndmild facial droop. lasted for about 10 minutes. Had MRI, etc with [facility] that per his report was ok (dr. Stephannie Li). Echo  (4/11): EF 55-60%, no regional WMAs, normal diastolic function, mild LAE, no source of embolus  Past Surgical History:  Procedure Laterality Date  . BMI 30.2 muscular  12/08/04  . curretage actinic keratosis R ear  01/20/05  . ETT low prob of CAD  11/21/03  . excision melanoma in situ back  01/20/05  . hemorrhoids sclerosed at colonoscopy  12/19/05  . IR IMAGING GUIDED PORT INSERTION  05/19/2020  . LAPAROSCOPIC INGUINAL HERNIA REPAIR Left 12/20/1995  . LUMBAR DISC SURGERY    . NERVE REPAIR     back of neck  . retinal laser surgery Left 09/20/1986  . SEPTOPLASTY  11/19/98  . TOTAL KNEE ARTHROPLASTY Left 12/13/2016   Procedure: TOTAL KNEE ARTHROPLASTY WITH RIGHT KNEE CORTISONE INJECTION;  Surgeon: Frederik Pear, MD;  Location: [place];  Service: Orthopedics;  Laterality: Left;  . uric acid 6.9  12/22/04  . x-ray l great toe  07/21/00       Fre Past Surgical History:  Procedure Laterality Date  . BMI 30.2 muscular  12/08/04  . curretage actinic keratosis R ear  01/20/05  . ETT low prob of CAD  11/21/03  . excision melanoma in situ back  01/20/05  . hemorrhoids sclerosed at colonoscopy  12/19/05  . IR IMAGING GUIDED PORT INSERTION  05/19/2020  . LAPAROSCOPIC INGUINAL HERNIA REPAIR Left 12/20/1995  . LUMBAR DISC SURGERY    . NERVE REPAIR     back of neck  . retinal laser surgery  Left 09/20/1986  . SEPTOPLASTY  11/19/98  . TOTAL KNEE ARTHROPLASTY Left 12/13/2016   Procedure: TOTAL KNEE ARTHROPLASTY WITH RIGHT KNEE CORTISONE INJECTION;  Surgeon: Frederik Pear, MD;  Location: [place];  Service: Orthopedics;  Laterality: Left;  . uric acid 6.9  12/22/04  . x-ray l great toe  07/21/00  quency Provider Last Rate Last Admin  . 0.9 %  sodium chloride infusion   Intravenous Continuous [name] IV, MD 20 mL/hr at 05/29/20 0400 Rate Verify at 05/29/20 0400  . allopurinol (ZYLOPRIM) tablet 300 mg  300 mg Oral Daily [name] R, NP   300 mg at 05/29/20 1038  . aspirin tablet 325 mg  325 mg Oral Q4H PRN Maryanna Shape, NP      . Chlorhexidine Gluconate Cloth 2 % PADS 6 each  6 each Topical Daily Maryanna Shape, NP   6 each at 05/29/20 1038  . Cold Pack 1 packet  1 packet Topical Once PRN [name] IV, MD      . DOXOrubicin (ADRIAMYCIN) 20 mg, etoposide (VEPESID) 96 mg, vinCRIStine (ONCOVIN) 0.8 mg in sodium chloride 0.9 % 1,000 mL chemo infusion   Intravenous Once Orson Slick, MD 51 mL/hr at 05/28/20 1422 New Bag at 05/28/20 1422  . DOXOrubicin (ADRIAMYCIN) 20 mg, etoposide (VEPESID) 96 mg, vinCRIStine (ONCOVIN) 0.8 mg in sodium chloride 0.9 % 1,000 mL chemo infusion   Intravenous Once Narda Rutherford T IV, MD      . enoxaparin (LOVENOX) injection 40 mg  40 mg Subcutaneous Q24H [name] R, NP   40 mg at 05/28/20 1116  . Hot Pack 1 packet  1 packet Topical Once PRN [name] IV, MD      . hydrocortisone (ANUSOL-HC) 2.5 % rectal cream 1 application  1 application Rectal Daily PRN Maryanna Shape, NP      . losartan (COZAAR) tablet 25 mg  25 mg Oral Daily [name] R, NP   25 mg at 05/29/20 1038  . multivitamin with minerals tablet 1 tablet  1 tablet Oral Daily Maryanna Shape, NP   1 tablet at 05/29/20 1038  . ondansetron (ZOFRAN-ODT) disintegrating tablet 8 mg  8 mg Oral Q8H PRN Curcio, Kristin R, NP      . pantoprazole (PROTONIX) EC tablet 40 mg  40 mg Oral BID [name] R, NP   40 mg at 05/29/20 1038  . polyethylene glycol (MIRALAX / GLYCOLAX) packet 17 g  17 g Oral Daily PRN Narda Rutherford T IV, MD      . predniSONE (DELTASONE) tablet 120 mg  120 mg Oral Q breakfast [name] IV, MD   120 mg at 05/29/20 0819  . prochlorperazine (COMPAZINE) tablet 10 mg  10 mg Oral Q6H PRN [name] R, NP      . rosuvastatin (CRESTOR) tablet 5 mg  5 mg Oral QPM Curcio, Kristin R, NP   5 mg at 05/28/20 1744  . senna-docusate (Senokot-S) tablet 2 tablet  2 tablet Oral BID Orson Slick, MD   2 tablet at 05/29/20 1038  . sodium bicarbonate/sodium chloride mouthwash 1032m    Mouth Rinse PRN Curcio, Kristin R, NP      . sodium chloride flush (NS) 0.9 % injection 10-40 mL  10-40 mL Intracatheter Q12H CMaryanna Shape NP   10 mL at 05/28/20 2129  . sodium chloride flush (NS) 0.9 % injection 10-40 mL  10-40 mL Intracatheter  PRN Maryanna Shape, NP      . sucralfate (CARAFATE) 1 GM/10ML suspension 1 g  1 g Oral TID WC & HS Maryanna Shape, NP   1 g at 05/29/20 2841       Family History  Problem Relation Age of Onset  . Liver cancer Sister   . Heart failure Brother 44       CABGx4  . Heart attack Mother 66  . Uterine cancer Mother   . Heart attack Father 23  . Asthma Son   . Diabetes Brother   . Prostate cancer Brother   Relation Age of Onset  . Liver cancer Sister   . Heart failure Brother 44       CABGx4  . Heart attack Mother 39  . Uterine cancer Mother   . Heart attack Father 20  . Asthma Son   . Diabetes Brother   . Prostate cancer Brother      Socioeconomic History  . Marital status: Married    Spouse name: [name]  . Number of children: 2  . Years of education: 57  . Highest education level: Not on file  Occupational History  . Occupation: Systems developer: [employer]  Tobacco Use  . Smoking status: Never Smoker  . Smokeless tobacco: Never Used  Vaping Use  . Vaping Use: Never used  Substance and Sexual Activity  . Alcohol use: No  . Drug use: No  . Sexual activity: Not on file  Other Topics Concern  . Not on file  Social History Narrative   Married to [employer] at [employer].   Son, [name] married   Son, [name], Married.    Caffeine use: 1 cup coffee per day   Social Determinants of Health   Financial Resource Strain:   . Difficulty of Paying Living Expenses: Not on file  Food Insecurity:   . Worried About Charity fundraiser in the Last Year: Not on file  . Ran Out of Food in the Last Year: Not on file  Transportation Needs:   . Lack of Transportation (Medical): Not on file  .  Lack of Transportation (Non-Medical): Not on file  Physical Activity:   . Days of Exercise per Week: Not on file  . Minutes of Exercise per Session: Not on file  Stress:   . Feeling of Stress : Not on file  Social Connections:   . Frequency of Communication with Friends and Family: Not on file  . Frequency of Social Gatherings with Friends and Family: Not on file  . Attends Religious Services: Not on file  . Active Member of Clubs or Organizations: Not on file  . Attends Archivist Meetings: Not on file  . Marital Status: Not on file  Intimate Partner Violence:   . Fear of Current or Ex-Partner: Not on file  . Emotionally Abused: Not on file  . Physically Abused: Not on file  . Sexually Abused: Not on file  :  Review of Systems: A comprehensive 14 point review of systems was negative except as noted in the HPI.  Exam: Patient Vitals for the past 24 hrs:  BP Temp Temp src Pulse Resp SpO2  05/29/20 0600 107/60 98 F (36.7 C) Oral (!) 56 20 97 %  05/28/20 2222 125/68 (!) 97.4 F (36.3 C) Oral 60 20 98 %  05/28/20 1503 113/75 (!) 97.4 F (36.3 C) Oral (!) 53 18 96 %  General:  well-nourished in no acute distress.   Eyes:  no scleral icterus.   ENT:  There were no oropharyngeal lesions.   Neck was without thyromegaly.   Lymphatics:  Negative cervical, supraclavicular or axillary adenopathy.   Respiratory: lungs were clear bilaterally without wheezing or crackles.   Cardiovascular:  Regular rate and rhythm, S1/S2, without murmur, rub or gallop.  There was no pedal edema.   GI:  Positive bowel sounds.  Left upper quadrant mass not palpable. Musculoskeletal:  no spinal tenderness of palpation of vertebral spine.   Skin exam was without echymosis, petichae.   Neuro exam was nonfocal. Patient was alert and oriented.  Attention was good.   Language was appropriate.  Mood was normal without depression.  Speech was not pressured.  Thought content was not tangential.      Lab Results  Component Value Date   WBC 17.9 (H) 05/29/2020   HGB 9.8 (L) 05/29/2020   HCT 30.3 (L) 05/29/2020   PLT 351 05/29/2020   GLUCOSE 141 (H) 05/29/2020   CHOL  12/31/2009    160        ATP III CLASSIFICATION:  <200     mg/dL   Desirable  200-239  mg/dL   Borderline High  >=240    mg/dL   High          TRIG 213 (H) 12/31/2009   HDL 28 (L) 12/31/2009   LDLCALC  12/31/2009    89        Total Cholesterol/HDL:CHD Risk Coronary Heart Disease Risk Table                     Men   Women  1/2 Average Risk   3.4   3.3  Average Risk       5.0   4.4  2 X Average Risk   9.6   7.1  3 X Average Risk  23.4   11.0        Use the calculated Patient Ratio above and the CHD Risk Table to determine the patient's CHD Risk.        ATP III CLASSIFICATION (LDL):  <100     mg/dL   Optimal  100-129  mg/dL   Near or Above                    Optimal  130-159  mg/dL   Borderline  160-189  mg/dL   High  >190     mg/dL   Very High   ALT 34 05/29/2020   AST 29 05/29/2020   NA 139 05/29/2020   K 4.0 05/29/2020   CL 109 05/29/2020   CREATININE 1.09 05/29/2020   BUN 27 (H) 05/29/2020   CO2 24 05/29/2020    CT Biopsy  Result Date: 05/08/2020 CLINICAL DATA:  13 cm left-sided mesenteric abdominal mass. EXAM: CT GUIDED CORE BIOPSY OF MESENTERIC MASS ANESTHESIA/SEDATION: 1.5 mg IV Versed; 75 mcg IV Fentanyl Total Moderate Sedation Time:  18 minutes. The patient's level of consciousness and physiologic status were continuously monitored during the procedure by Radiology nursing. PROCEDURE: The procedure risks, benefits, and alternatives were explained to the patient. Questions regarding the procedure were encouraged and answered. The patient understands and consents to the procedure. A time-out was performed prior to initiating the procedure. CT was performed through the abdomen in a supine position. The left abdominal wall was prepped with chlorhexidine in a sterile fashion, and a sterile drape  was  applied covering the operative field. A sterile gown and sterile gloves were used for the procedure. Local anesthesia was provided with 1% Lidocaine. Under CT guidance, a 17 gauge trocar needle was advanced to the level of a left-sided mesenteric mass. After confirming needle tip position, 4 separate coaxial 18 gauge core biopsy samples were obtained and submitted in saline. Some Gel-Foam pledgets were advanced through the outer needle as the needle was retracted and removed. COMPLICATIONS: None FINDINGS: Large left-sided intra-abdominal mass within the mesentery measures up to 13.3 cm in maximum diameter. Solid tissue was obtained. IMPRESSION: CT-guided core biopsy performed of large left-sided mesenteric mass measuring just over 13 cm in estimated maximal diameter. Electronically Signed   By: Aletta Edouard M.D.   On: 05/08/2020 15:02   ECHOCARDIOGRAM COMPLETE  Result Date: 05/22/2020    ECHOCARDIOGRAM REPORT   Patient Name:   Connor Adams [facility] Date of Exam: 05/22/2020 Medical Rec #:  [id number]        Height:       68.0 in Accession #:    [id number]       Weight:       171.0 lb Date of Birth:  1946/08/26        BSA:          1.912 m Patient Age:    46 years         BP:           116/77 mmHg Patient Gender: M                HR:           80 bpm. Exam Location:  Outpatient Procedure: 2D Echo, Cardiac Doppler and Color Doppler Indications:    Chemotherapy evaluation v87.41 / v58.11  History:        Patient has no prior history of Echocardiogram examinations. TIA                 and Stroke; Risk Factors:Hypertension, Dyslipidemia and GERD.  Sonographer:    Bernadene Person RDCS Referring Phys: [id number] [place]  1. Left ventricular ejection fraction, by estimation, is 60 to 65%. The left ventricle has normal function. The left ventricle has no regional wall motion abnormalities. Left ventricular diastolic parameters are consistent with Grade I diastolic dysfunction (impaired relaxation). The  average left ventricular global longitudinal strain is -17.6 %. The global longitudinal strain is normal.  2. Right ventricular systolic function is normal. The right ventricular size is normal.  3. The mitral valve is normal in structure. No evidence of mitral valve regurgitation. No evidence of mitral stenosis.  4. The aortic valve is normal in structure. Aortic valve regurgitation is not visualized. No aortic stenosis is present.  5. The inferior vena cava is normal in size with greater than 50% respiratory variability, suggesting right atrial pressure of 3 mmHg. FINDINGS  Left Ventricle: Left ventricular ejection fraction, by estimation, is 60 to 65%. The left ventricle has normal function. The left ventricle has no regional wall motion abnormalities. The average left ventricular global longitudinal strain is -17.6 %. The global longitudinal strain is normal. The left ventricular internal cavity size was normal in size. There is no left ventricular hypertrophy. Left ventricular diastolic parameters are consistent with Grade I diastolic dysfunction (impaired relaxation). Right Ventricle: The right ventricular size is normal. No increase in right ventricular wall thickness. Right ventricular systolic function is normal. Left Atrium: Left atrial size was normal in size. Right Atrium:  Right atrial size was normal in size. Pericardium: There is no evidence of pericardial effusion. Mitral Valve: The mitral valve is normal in structure. Normal mobility of the mitral valve leaflets. No evidence of mitral valve regurgitation. No evidence of mitral valve stenosis. Tricuspid Valve: The tricuspid valve is normal in structure. Tricuspid valve regurgitation is not demonstrated. No evidence of tricuspid stenosis. Aortic Valve: The aortic valve is normal in structure. Aortic valve regurgitation is not visualized. No aortic stenosis is present. Pulmonic Valve: The pulmonic valve was normal in structure. Pulmonic valve  regurgitation is trivial. No evidence of pulmonic stenosis. Aorta: The aortic root is normal in size and structure. Venous: The inferior vena cava is normal in size with greater than 50% respiratory variability, suggesting right atrial pressure of 3 mmHg. IAS/Shunts: No atrial level shunt detected by color flow Doppler.  LEFT VENTRICLE PLAX 2D LVIDd:         5.10 cm  Diastology LVIDs:         3.20 cm  LV e' lateral:   9.94 cm/s LV PW:         0.70 cm  LV E/e' lateral: 4.6 LV IVS:        0.70 cm  LV e' medial:    6.85 cm/s LVOT diam:     2.10 cm  LV E/e' medial:  6.6 LV SV:         81 LV SV Index:   43       2D Longitudinal Strain LVOT Area:     3.46 cm 2D Strain GLS Avg:     -17.6 %  RIGHT VENTRICLE RV S prime:     9.79 cm/s TAPSE (M-mode): 1.6 cm LEFT ATRIUM             Index       RIGHT ATRIUM           Index LA diam:        3.70 cm 1.94 cm/m  RA Area:     14.40 cm LA Vol (A2C):   69.5 ml 36.35 ml/m RA Volume:   31.90 ml  16.68 ml/m LA Vol (A4C):   51.0 ml 26.67 ml/m LA Biplane Vol: 59.4 ml 31.07 ml/m  AORTIC VALVE LVOT Vmax:   143.00 cm/s LVOT Vmean:  98.900 cm/s LVOT VTI:    0.235 m  AORTA Ao Root diam: 3.20 cm Ao Asc diam:  3.40 cm MITRAL VALVE MV Area (PHT): 2.22 cm    SHUNTS MV Decel Time: 342 msec    Systemic VTI:  0.24 m MV E velocity: 45.30 cm/s  Systemic Diam: 2.10 cm MV A velocity: 72.50 cm/s MV E/A ratio:  0.62 Candee Furbish MD Electronically signed by Candee Furbish MD Signature Date/Time: 05/22/2020/12:55:40 PM    Final    IR IMAGING GUIDED PORT INSERTION  Result Date: 05/19/2020 INDICATION: History of diffuse large B-cell lymphoma. In need of durable intravenous access for chemotherapy administration EXAM: IMPLANTED PORT A CATH PLACEMENT WITH ULTRASOUND AND FLUOROSCOPIC GUIDANCE COMPARISON:  PET-CT-04/28/2020 MEDICATIONS: Ancef 2 gm IV; The antibiotic was administered within an appropriate time interval prior to skin puncture. ANESTHESIA/SEDATION: Moderate (conscious) sedation was employed  during this procedure. A total of Versed 2 mg and Fentanyl 100 mcg was administered intravenously. Moderate Sedation Time: 25 minutes. The patient's level of consciousness and vital signs were monitored continuously by radiology nursing throughout the procedure under my direct supervision. CONTRAST:  None FLUOROSCOPY TIME:  24 seconds (5 mGy) COMPLICATIONS: None immediate.  PROCEDURE: The procedure, risks, benefits, and alternatives were explained to the patient. Questions regarding the procedure were encouraged and answered. The patient understands and consents to the procedure. The right neck and chest were prepped with chlorhexidine in a sterile fashion, and a sterile drape was applied covering the operative field. Maximum barrier sterile technique with sterile gowns and gloves were used for the procedure. A timeout was performed prior to the initiation of the procedure. Local anesthesia was provided with 1% lidocaine with epinephrine. After creating a small venotomy incision, a micropuncture kit was utilized to access the internal jugular vein. Real-time ultrasound guidance was utilized for vascular access including the acquisition of a permanent ultrasound image documenting patency of the accessed vessel. The microwire was utilized to measure appropriate catheter length. A subcutaneous port pocket was then created along the upper chest wall utilizing a combination of sharp and blunt dissection. The pocket was irrigated with sterile saline. A single lumen "Slim" sized power injectable port was chosen for placement. The 8 Fr catheter was tunneled from the port pocket site to the venotomy incision. The port was placed in the pocket. The external catheter was trimmed to appropriate length. At the venotomy, an 8 Fr peel-away sheath was placed over a guidewire under fluoroscopic guidance. The catheter was then placed through the sheath and the sheath was removed. Final catheter positioning was confirmed and documented  with a fluoroscopic spot radiograph. The port was accessed with a Huber needle, aspirated and flushed with heparinized saline. The venotomy site was closed with an interrupted 4-0 Vicryl suture. The port pocket incision was closed with interrupted 2-0 Vicryl suture. The skin was opposed with a running subcuticular 4-0 Vicryl suture. Dermabond and Steri-strips were applied to both incisions. Dressings were applied. The patient tolerated the procedure well without immediate post procedural complication. FINDINGS: After catheter placement, the tip lies within the superior cavoatrial junction. The catheter aspirates and flushes normally and is ready for immediate use. IMPRESSION: Successful placement of a right internal jugular approach power injectable Port-A-Cath. The catheter is ready for immediate use. Electronically Signed   By: Sandi Mariscal M.D.   On: 05/19/2020 15:59     CT Biopsy  Result Date: 05/08/2020 CLINICAL DATA:  13 cm left-sided mesenteric abdominal mass. EXAM: CT GUIDED CORE BIOPSY OF MESENTERIC MASS ANESTHESIA/SEDATION: 1.5 mg IV Versed; 75 mcg IV Fentanyl Total Moderate Sedation Time:  18 minutes. The patient's level of consciousness and physiologic status were continuously monitored during the procedure by Radiology nursing. PROCEDURE: The procedure risks, benefits, and alternatives were explained to the patient. Questions regarding the procedure were encouraged and answered. The patient understands and consents to the procedure. A time-out was performed prior to initiating the procedure. CT was performed through the abdomen in a supine position. The left abdominal wall was prepped with chlorhexidine in a sterile fashion, and a sterile drape was applied covering the operative field. A sterile gown and sterile gloves were used for the procedure. Local anesthesia was provided with 1% Lidocaine. Under CT guidance, a 17 gauge trocar needle was advanced to the level of a left-sided mesenteric mass.  After confirming needle tip position, 4 separate coaxial 18 gauge core biopsy samples were obtained and submitted in saline. Some Gel-Foam pledgets were advanced through the outer needle as the needle was retracted and removed. COMPLICATIONS: None FINDINGS: Large left-sided intra-abdominal mass within the mesentery measures up to 13.3 cm in maximum diameter. Solid tissue was obtained. IMPRESSION: CT-guided core biopsy performed of large  left-sided mesenteric mass measuring just over 13 cm in estimated maximal diameter. Electronically Signed   By: Aletta Edouard M.D.   On: 05/08/2020 15:02   ECHOCARDIOGRAM COMPLETE  Result Date: 05/22/2020    ECHOCARDIOGRAM REPORT   Patient Name:   TRENTAN TRIPPE [facility] Date of Exam: 05/22/2020 Medical Rec #:  [id number]        Height:       68.0 in Accession #:    [id number]       Weight:       171.0 lb Date of Birth:  May 12, 1946        BSA:          1.912 m Patient Age:    73 years         BP:           116/77 mmHg Patient Gender: M                HR:           80 bpm. Exam Location:  Outpatient Procedure: 2D Echo, Cardiac Doppler and Color Doppler Indications:    Chemotherapy evaluation v87.41 / v58.11  History:        Patient has no prior history of Echocardiogram examinations. TIA                 and Stroke; Risk Factors:Hypertension, Dyslipidemia and GERD.  Sonographer:    Bernadene Person RDCS Referring Phys: [id number] [place]  1. Left ventricular ejection fraction, by estimation, is 60 to 65%. The left ventricle has normal function. The left ventricle has no regional wall motion abnormalities. Left ventricular diastolic parameters are consistent with Grade I diastolic dysfunction (impaired relaxation). The average left ventricular global longitudinal strain is -17.6 %. The global longitudinal strain is normal.  2. Right ventricular systolic function is normal. The right ventricular size is normal.  3. The mitral valve is normal in structure. No evidence of mitral  valve regurgitation. No evidence of mitral stenosis.  4. The aortic valve is normal in structure. Aortic valve regurgitation is not visualized. No aortic stenosis is present.  5. The inferior vena cava is normal in size with greater than 50% respiratory variability, suggesting right atrial pressure of 3 mmHg. FINDINGS  Left Ventricle: Left ventricular ejection fraction, by estimation, is 60 to 65%. The left ventricle has normal function. The left ventricle has no regional wall motion abnormalities. The average left ventricular global longitudinal strain is -17.6 %. The global longitudinal strain is normal. The left ventricular internal cavity size was normal in size. There is no left ventricular hypertrophy. Left ventricular diastolic parameters are consistent with Grade I diastolic dysfunction (impaired relaxation). Right Ventricle: The right ventricular size is normal. No increase in right ventricular wall thickness. Right ventricular systolic function is normal. Left Atrium: Left atrial size was normal in size. Right Atrium: Right atrial size was normal in size. Pericardium: There is no evidence of pericardial effusion. Mitral Valve: The mitral valve is normal in structure. Normal mobility of the mitral valve leaflets. No evidence of mitral valve regurgitation. No evidence of mitral valve stenosis. Tricuspid Valve: The tricuspid valve is normal in structure. Tricuspid valve regurgitation is not demonstrated. No evidence of tricuspid stenosis. Aortic Valve: The aortic valve is normal in structure. Aortic valve regurgitation is not visualized. No aortic stenosis is present. Pulmonic Valve: The pulmonic valve was normal in structure. Pulmonic valve regurgitation is trivial. No evidence of pulmonic stenosis. Aorta: The aortic  root is normal in size and structure. Venous: The inferior vena cava is normal in size with greater than 50% respiratory variability, suggesting right atrial pressure of 3 mmHg. IAS/Shunts: No  atrial level shunt detected by color flow Doppler.  LEFT VENTRICLE PLAX 2D LVIDd:         5.10 cm  Diastology LVIDs:         3.20 cm  LV e' lateral:   9.94 cm/s LV PW:         0.70 cm  LV E/e' lateral: 4.6 LV IVS:        0.70 cm  LV e' medial:    6.85 cm/s LVOT diam:     2.10 cm  LV E/e' medial:  6.6 LV SV:         81 LV SV Index:   43       2D Longitudinal Strain LVOT Area:     3.46 cm 2D Strain GLS Avg:     -17.6 %  RIGHT VENTRICLE RV S prime:     9.79 cm/s TAPSE (M-mode): 1.6 cm LEFT ATRIUM             Index       RIGHT ATRIUM           Index LA diam:        3.70 cm 1.94 cm/m  RA Area:     14.40 cm LA Vol (A2C):   69.5 ml 36.35 ml/m RA Volume:   31.90 ml  16.68 ml/m LA Vol (A4C):   51.0 ml 26.67 ml/m LA Biplane Vol: 59.4 ml 31.07 ml/m  AORTIC VALVE LVOT Vmax:   143.00 cm/s LVOT Vmean:  98.900 cm/s LVOT VTI:    0.235 m  AORTA Ao Root diam: 3.20 cm Ao Asc diam:  3.40 cm MITRAL VALVE MV Area (PHT): 2.22 cm    SHUNTS MV Decel Time: 342 msec    Systemic VTI:  0.24 m MV E velocity: 45.30 cm/s  Systemic Diam: 2.10 cm MV A velocity: 72.50 cm/s MV E/A ratio:  0.62 Candee Furbish MD Electronically signed by Candee Furbish MD Signature Date/Time: 05/22/2020/12:55:40 PM    Final    IR IMAGING GUIDED PORT INSERTION  Result Date: 05/19/2020 INDICATION: History of diffuse large B-cell lymphoma. In need of durable intravenous access for chemotherapy administration EXAM: IMPLANTED PORT A CATH PLACEMENT WITH ULTRASOUND AND FLUOROSCOPIC GUIDANCE COMPARISON:  PET-CT-04/28/2020 MEDICATIONS: Ancef 2 gm IV; The antibiotic was administered within an appropriate time interval prior to skin puncture. ANESTHESIA/SEDATION: Moderate (conscious) sedation was employed during this procedure. A total of Versed 2 mg and Fentanyl 100 mcg was administered intravenously. Moderate Sedation Time: 25 minutes. The patient's level of consciousness and vital signs were monitored continuously by radiology nursing throughout the procedure under my  direct supervision. CONTRAST:  None FLUOROSCOPY TIME:  24 seconds (5 mGy) COMPLICATIONS: None immediate. PROCEDURE: The procedure, risks, benefits, and alternatives were explained to the patient. Questions regarding the procedure were encouraged and answered. The patient understands and consents to the procedure. The right neck and chest were prepped with chlorhexidine in a sterile fashion, and a sterile drape was applied covering the operative field. Maximum barrier sterile technique with sterile gowns and gloves were used for the procedure. A timeout was performed prior to the initiation of the procedure. Local anesthesia was provided with 1% lidocaine with epinephrine. After creating a small venotomy incision, a micropuncture kit was utilized to access the internal jugular vein. Real-time ultrasound guidance  was utilized for vascular access including the acquisition of a permanent ultrasound image documenting patency of the accessed vessel. The microwire was utilized to measure appropriate catheter length. A subcutaneous port pocket was then created along the upper chest wall utilizing a combination of sharp and blunt dissection. The pocket was irrigated with sterile saline. A single lumen "Slim" sized power injectable port was chosen for placement. The 8 Fr catheter was tunneled from the port pocket site to the venotomy incision. The port was placed in the pocket. The external catheter was trimmed to appropriate length. At the venotomy, an 8 Fr peel-away sheath was placed over a guidewire under fluoroscopic guidance. The catheter was then placed through the sheath and the sheath was removed. Final catheter positioning was confirmed and documented with a fluoroscopic spot radiograph. The port was accessed with a Huber needle, aspirated and flushed with heparinized saline. The venotomy site was closed with an interrupted 4-0 Vicryl suture. The port pocket incision was closed with interrupted 2-0 Vicryl suture. The  skin was opposed with a running subcuticular 4-0 Vicryl suture. Dermabond and Steri-strips were applied to both incisions. Dressings were applied. The patient tolerated the procedure well without immediate post procedural complication. FINDINGS: After catheter placement, the tip lies within the superior cavoatrial junction. The catheter aspirates and flushes normally and is ready for immediate use. IMPRESSION: Successful placement of a right internal jugular approach power injectable Port-A-Cath. The catheter is ready for immediate use. Electronically Signed   By: Sandi Mariscal M.D.   On: 05/19/2020 15:59    Assessment and Plan:  Connor Adams 74 y.o. 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  He is currently receiving cycle #3 of EPOCH-R starting on 07/07/2020.  Today he is Cycle 3 Day 3 of treatment.  He is tolerating his chemotherapy well so far with no nausea or vomiting.  Bowels are moving and he has a good appetite.  There are no barriers to proceeding with treatment.      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 Stage I bulky disease.   --Continue EPOCH-R. Today is Cycle 3 Day 3 --Adverse effects have been reviewed including, but not limited to, alopecia, myelosuppression, peripheral neuropathy, mucositis, liver/renal dysfunction, and possible infusion reaction associated with Rituxan. He agrees to proceed. --PAC in place and ready for use. --Echocardiogram from 05/22/2020 shows LVEF of 60-65%  --CBC adequate to proceed  with treatment today.  Leukocytosis likely related to steroids.  Anemia slightly worse this morning.  --Will monitor closely with CBC with diff, CMET, LDH, and uric acid --continue Allopurinol 300 mg daily.  --Lovenox for DVT prophylaxis. --Continue home medications for HTN and hyperlipidemia.  --Continue PRN antiemetics. --Continue MiraLAX and Senokot.  Current plan for d/c on 07/11/2020. The patient will return to the cancer center on 07/14/2020 for Rituxan and Neulasta and will have labs and a follow-up visit on 07/16/2020.   [name], MD Department of Hematology/Oncology [place] [facility] Phone: [phone] Pager: [phone] Email: [email or web address]

## 2020-07-09 NOTE — Progress Notes (Signed)
Patient pulled  out his port a cath needle by accident, chemo infusion paused for 24mins , port reaccessed at 0755 chemo restarted.

## 2020-07-10 DIAGNOSIS — Z5111 Encounter for antineoplastic chemotherapy: Secondary | ICD-10-CM | POA: Diagnosis not present

## 2020-07-10 DIAGNOSIS — C8333 Diffuse large B-cell lymphoma, intra-abdominal lymph nodes: Secondary | ICD-10-CM | POA: Diagnosis not present

## 2020-07-10 LAB — CBC WITH DIFFERENTIAL/PLATELET
Abs Immature Granulocytes: 0.14 10*3/uL — ABNORMAL HIGH (ref 0.00–0.07)
Basophils Absolute: 0 10*3/uL (ref 0.0–0.1)
Basophils Relative: 0 %
Eosinophils Absolute: 0 10*3/uL (ref 0.0–0.5)
Eosinophils Relative: 0 %
HCT: 26.8 % — ABNORMAL LOW (ref 39.0–52.0)
Hemoglobin: 8.4 g/dL — ABNORMAL LOW (ref 13.0–17.0)
Immature Granulocytes: 1 %
Lymphocytes Relative: 3 %
Lymphs Abs: 0.4 10*3/uL — ABNORMAL LOW (ref 0.7–4.0)
MCH: 29.3 pg (ref 26.0–34.0)
MCHC: 31.3 g/dL (ref 30.0–36.0)
MCV: 93.4 fL (ref 80.0–100.0)
Monocytes Absolute: 0.5 10*3/uL (ref 0.1–1.0)
Monocytes Relative: 3 %
Neutro Abs: 15.1 10*3/uL — ABNORMAL HIGH (ref 1.7–7.7)
Neutrophils Relative %: 93 %
Platelets: 326 10*3/uL (ref 150–400)
RBC: 2.87 MIL/uL — ABNORMAL LOW (ref 4.22–5.81)
RDW: 20.9 % — ABNORMAL HIGH (ref 11.5–15.5)
WBC: 16.2 10*3/uL — ABNORMAL HIGH (ref 4.0–10.5)
nRBC: 0 % (ref 0.0–0.2)

## 2020-07-10 LAB — COMPREHENSIVE METABOLIC PANEL
ALT: 28 U/L (ref 0–44)
AST: 19 U/L (ref 15–41)
Albumin: 3.1 g/dL — ABNORMAL LOW (ref 3.5–5.0)
Alkaline Phosphatase: 58 U/L (ref 38–126)
Anion gap: 7 (ref 5–15)
BUN: 33 mg/dL — ABNORMAL HIGH (ref 8–23)
CO2: 23 mmol/L (ref 22–32)
Calcium: 8.2 mg/dL — ABNORMAL LOW (ref 8.9–10.3)
Chloride: 109 mmol/L (ref 98–111)
Creatinine, Ser: 1.18 mg/dL (ref 0.61–1.24)
GFR, Estimated: >60 mL/min (ref >60)
Glucose, Bld: 139 mg/dL — ABNORMAL HIGH (ref 70–99)
Potassium: 3.8 mmol/L (ref 3.5–5.1)
Sodium: 139 mmol/L (ref 135–145)
Total Bilirubin: 0.7 mg/dL (ref 0.3–1.2)
Total Protein: 5.4 g/dL — ABNORMAL LOW (ref 6.5–8.1)

## 2020-07-10 LAB — URIC ACID: Uric Acid, Serum: 4.7 mg/dL (ref 3.7–8.6)

## 2020-07-10 LAB — LACTATE DEHYDROGENASE: LDH: 124 U/L (ref 98–192)

## 2020-07-10 MED ORDER — SODIUM CHLORIDE 0.9 % IV SOLN
Freq: Once | INTRAVENOUS | Status: AC
  Administered 2020-07-10: 8 mg via INTRAVENOUS
  Filled 2020-07-10: qty 4

## 2020-07-10 MED ORDER — VINCRISTINE SULFATE CHEMO INJECTION 1 MG/ML
Freq: Once | INTRAVENOUS | Status: AC
  Filled 2020-07-10: qty 10

## 2020-07-10 NOTE — Progress Notes (Addendum)
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   1) 04/17/2020: CT A/P showed dominant nodal mass(11.5 x 10.9 cm)within the jejunal mesentery, highly suspicious for lymphoma. Additionally there are left lower lobe pleural-based pulmonary nodules 2) 04/24/2020: establish care with Dr. Lorenso Courier 3) 04/28/2020:  PET CT scan shows Large solid left mesenteric mass 13.0 cm with maximum SUV of 21.2, [place] 5 with local hypermetabolic porta hepatis and retroperitoneal lymph nodes. Constitutes bulky Stage I disease.   Reason for Admission: double hit lymphoma, Cycle #3 EPOCH-R  HPI: Mr. Abshier is a 74 year old male with medical history significant for double hit DLBCL. He presents for Cycle 3 of R-EPOCH chemotherapy. Risks and benefits have been discussed and he has agreed to proceed  Interval History: The patient continues to tolerate his chemotherapy well overall.  He is sleeping better with receiving prednisone earlier in the day.  Denies mucositis, nausea, vomiting.  Bowels move last evening. He continues to maintain a good energy level and is ambulating in the hallway without any difficulty.     Diagnosis Date  . Arthritis   . Chronic kidney disease (CKD), stage III (moderate)   . COVID-19   . GERD (gastroesophageal reflux disease)    occ  . Hemorrhoids   . History of ETT 3/05   low risk  . History of hiatal hernia   . History of kidney stones   . HLD (hyperlipidemia)   . HTN (hypertension)   . Left inguinal hernia   . Melanoma ([place])    excied  . 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 Stroke [facility]) 2011   tia   . TIA (transient ischemic attack) 4/11   associated w slurred speecha ndmild facial droop. lasted for about 10 minutes. Had  MRI, etc with [facility] that per his report was ok (dr. Stephannie Li). Echo (4/11): EF 55-60%, no regional WMAs, normal diastolic function, mild LAE, no source of embolus  Past Surgical History:  Procedure Laterality Date  . BMI 30.2 muscular  12/08/04  . curretage actinic keratosis R ear  01/20/05  . ETT low prob of CAD  11/21/03  . excision melanoma in situ back  01/20/05  . hemorrhoids sclerosed at colonoscopy  12/19/05  . IR IMAGING GUIDED PORT INSERTION  05/19/2020  . LAPAROSCOPIC INGUINAL HERNIA REPAIR Left 12/20/1995  . LUMBAR DISC SURGERY    . NERVE REPAIR     back of neck  . retinal laser surgery Left 09/20/1986  . SEPTOPLASTY  11/19/98  . TOTAL KNEE ARTHROPLASTY Left 12/13/2016   Procedure: TOTAL KNEE ARTHROPLASTY WITH RIGHT KNEE CORTISONE INJECTION;  Surgeon: Frederik Pear, MD;  Location: [facility];  Service: Orthopedics;  Laterality: Left;  . uric acid 6.9  12/22/04  . x-ray l great toe  07/21/00       Fre Past Surgical History:  Procedure Laterality Date  . BMI 30.2 muscular  12/08/04  . curretage actinic keratosis R ear  01/20/05  . ETT low prob of CAD  11/21/03  . excision melanoma in situ back  01/20/05  . hemorrhoids sclerosed at colonoscopy  12/19/05  . IR IMAGING GUIDED PORT INSERTION  05/19/2020  . LAPAROSCOPIC INGUINAL HERNIA REPAIR Left 12/20/1995  . LUMBAR DISC SURGERY    . NERVE  REPAIR     back of neck  . retinal laser surgery Left 09/20/1986  . SEPTOPLASTY  11/19/98  . TOTAL KNEE ARTHROPLASTY Left 12/13/2016   Procedure: TOTAL KNEE ARTHROPLASTY WITH RIGHT KNEE CORTISONE INJECTION;  Surgeon: Gean Birchwood, MD;  Location: [facility];  Service: Orthopedics;  Laterality: Left;  . uric acid 6.9  12/22/04  . x-ray l great toe  07/21/00  quency Provider Last Rate Last Admin  . 0.9 %  sodium chloride infusion   Intravenous Continuous Ulysees Barns IV, MD 20 mL/hr at 05/29/20 0400 Rate Verify at 05/29/20 0400  . allopurinol (ZYLOPRIM) tablet 300 mg  300 mg Oral Daily Clenton Pare R, NP   300  mg at 05/29/20 1038  . aspirin tablet 325 mg  325 mg Oral Q4H PRN Myrtis Ser, NP      . Chlorhexidine Gluconate Cloth 2 % PADS 6 each  6 each Topical Daily Myrtis Ser, NP   6 each at 05/29/20 1038  . Cold Pack 1 packet  1 packet Topical Once PRN Ulysees Barns IV, MD      . DOXOrubicin (ADRIAMYCIN) 20 mg, etoposide (VEPESID) 96 mg, vinCRIStine (ONCOVIN) 0.8 mg in sodium chloride 0.9 % 1,000 mL chemo infusion   Intravenous Once Jaci Standard, MD 51 mL/hr at 05/28/20 1422 New Bag at 05/28/20 1422  . DOXOrubicin (ADRIAMYCIN) 20 mg, etoposide (VEPESID) 96 mg, vinCRIStine (ONCOVIN) 0.8 mg in sodium chloride 0.9 % 1,000 mL chemo infusion   Intravenous Once Jeanie Sewer T IV, MD      . enoxaparin (LOVENOX) injection 40 mg  40 mg Subcutaneous Q24H Clenton Pare R, NP   40 mg at 05/28/20 1116  . Hot Pack 1 packet  1 packet Topical Once PRN Ulysees Barns IV, MD      . hydrocortisone (ANUSOL-HC) 2.5 % rectal cream 1 application  1 application Rectal Daily PRN Myrtis Ser, NP      . losartan (COZAAR) tablet 25 mg  25 mg Oral Daily Clenton Pare R, NP   25 mg at 05/29/20 1038  . multivitamin with minerals tablet 1 tablet  1 tablet Oral Daily Myrtis Ser, NP   1 tablet at 05/29/20 1038  . ondansetron (ZOFRAN-ODT) disintegrating tablet 8 mg  8 mg Oral Q8H PRN Curcio, Kristin R, NP      . pantoprazole (PROTONIX) EC tablet 40 mg  40 mg Oral BID Clenton Pare R, NP   40 mg at 05/29/20 1038  . polyethylene glycol (MIRALAX / GLYCOLAX) packet 17 g  17 g Oral Daily PRN Jeanie Sewer T IV, MD      . predniSONE (DELTASONE) tablet 120 mg  120 mg Oral Q breakfast Ulysees Barns IV, MD   120 mg at 05/29/20 0819  . prochlorperazine (COMPAZINE) tablet 10 mg  10 mg Oral Q6H PRN Clenton Pare R, NP      . rosuvastatin (CRESTOR) tablet 5 mg  5 mg Oral QPM Curcio, Kristin R, NP   5 mg at 05/28/20 1744  . senna-docusate (Senokot-S) tablet 2 tablet  2 tablet Oral BID Jaci Standard, MD   2  tablet at 05/29/20 1038  . sodium bicarbonate/sodium chloride mouthwash   Mouth Rinse PRN Curcio, Kristin R, NP      . sodium chloride flush (NS) 0.9 % injection 10-40 mL  10-40 mL Intracatheter Q12H Myrtis Ser, NP   10 mL at 05/28/20 2129  .  sodium chloride flush (NS) 0.9 % injection 10-40 mL  10-40 mL Intracatheter PRN Curcio, Kristin R, NP      . sucralfate (CARAFATE) 1 GM/10ML suspension 1 g  1 g Oral TID WC & HS Curcio, Roselie Awkward, NP   1 g at 05/29/20 8099       Family History  Problem Relation Age of Onset  . Liver cancer Sister   . Heart failure Brother 44       CABGx4  . Heart attack Mother 70  . Uterine cancer Mother   . Heart attack Father 20  . Asthma Son   . Diabetes Brother   . Prostate cancer Brother   Relation Age of Onset  . Liver cancer Sister   . Heart failure Brother 44       CABGx4  . Heart attack Mother 59  . Uterine cancer Mother   . Heart attack Father 54  . Asthma Son   . Diabetes Brother   . Prostate cancer Brother      Socioeconomic History  . Marital status: Married    Spouse name: [name]  . Number of children: 2  . Years of education: 62  . Highest education level: Not on file  Occupational History  . Occupation: Systems developer: [employer]  Tobacco Use  . Smoking status: Never Smoker  . Smokeless tobacco: Never Used  Vaping Use  . Vaping Use: Never used  Substance and Sexual Activity  . Alcohol use: No  . Drug use: No  . Sexual activity: Not on file  Other Topics Concern  . Not on file  Social History Narrative   Married to [employer] at [employer].   Son, [name] married   Son, [name], Married.    Caffeine use: 1 cup coffee per day   Social Determinants of Health   Financial Resource Strain:   . Difficulty of Paying Living Expenses: Not on file  Food Insecurity:   . Worried About Charity fundraiser in the Last Year: Not on file  . Ran Out of Food in the Last Year: Not on file   Transportation Needs:   . Lack of Transportation (Medical): Not on file  . Lack of Transportation (Non-Medical): Not on file  Physical Activity:   . Days of Exercise per Week: Not on file  . Minutes of Exercise per Session: Not on file  Stress:   . Feeling of Stress : Not on file  Social Connections:   . Frequency of Communication with Friends and Family: Not on file  . Frequency of Social Gatherings with Friends and Family: Not on file  . Attends Religious Services: Not on file  . Active Member of Clubs or Organizations: Not on file  . Attends Archivist Meetings: Not on file  . Marital Status: Not on file  Intimate Partner Violence:   . Fear of Current or Ex-Partner: Not on file  . Emotionally Abused: Not on file  . Physically Abused: Not on file  . Sexually Abused: Not on file  :  Review of Systems: A comprehensive 14 point review of systems was negative except as noted in the HPI.  Exam: Patient Vitals for the past 24 hrs:  BP Temp Temp src Pulse Resp SpO2  05/29/20 0600 107/60 98 F (36.7 C) Oral (!) 56 20 97 %  05/28/20 2222 125/68 (!) 97.4 F (36.3 C) Oral 60 20 98 %  05/28/20 1503 113/75 (!) 97.4 F (36.3 C) Oral (!) 53 18 96 %    General:  well-nourished in no acute distress.   Eyes:  no scleral icterus.   ENT:  There were no oropharyngeal lesions.   Neck was without thyromegaly.   Lymphatics:  Negative cervical, supraclavicular or axillary adenopathy.   Respiratory: lungs were clear bilaterally without wheezing or crackles.   Cardiovascular:  Regular rate and rhythm, S1/S2, without murmur, rub or gallop.  There was no pedal edema.   GI:  Positive bowel sounds.  Left upper quadrant mass not palpable. Musculoskeletal:  no spinal tenderness of palpation of vertebral spine.   Skin exam was without echymosis, petichae.   Neuro exam was nonfocal. Patient was alert and oriented.  Attention was good.   Language was appropriate.  Mood was normal without  depression.  Speech was not pressured.  Thought content was not tangential.     Lab Results  Component Value Date   WBC 17.9 (H) 05/29/2020   HGB 9.8 (L) 05/29/2020   HCT 30.3 (L) 05/29/2020   PLT 351 05/29/2020   GLUCOSE 141 (H) 05/29/2020   CHOL  12/31/2009    160        ATP III CLASSIFICATION:  <200     mg/dL   Desirable  068-591  mg/dL   Borderline High  >=721    mg/dL   High          TRIG 461 (H) 12/31/2009   HDL 28 (L) 12/31/2009   LDLCALC  12/31/2009    89        Total Cholesterol/HDL:CHD Risk Coronary Heart Disease Risk Table                     Men   Women  1/2 Average Risk   3.4   3.3  Average Risk       5.0   4.4  2 X Average Risk   9.6   7.1  3 X Average Risk  23.4   11.0        Use the calculated Patient Ratio above and the CHD Risk Table to determine the patient's CHD Risk.        ATP III CLASSIFICATION (LDL):  <100     mg/dL   Optimal  968-910  mg/dL   Near or Above                    Optimal  130-159  mg/dL   Borderline  191-991  mg/dL   High  >811     mg/dL   Very High   ALT 34 27/93/6828   AST 29 05/29/2020   NA 139 05/29/2020   K 4.0 05/29/2020   CL 109 05/29/2020   CREATININE 1.09 05/29/2020   BUN 27 (H) 05/29/2020   CO2 24 05/29/2020    CT Biopsy  Result Date: 05/08/2020 CLINICAL DATA:  13 cm left-sided mesenteric abdominal mass. EXAM: CT GUIDED CORE BIOPSY OF MESENTERIC MASS ANESTHESIA/SEDATION: 1.5 mg IV Versed; 75 mcg IV Fentanyl Total Moderate Sedation Time:  18 minutes. The patient's level of consciousness and physiologic status were continuously monitored during the procedure by Radiology nursing. PROCEDURE: The procedure risks, benefits, and alternatives were explained to the patient. Questions regarding the procedure were encouraged and answered. The patient understands and consents to the procedure. A time-out was performed prior to initiating the procedure. CT was performed through the abdomen in a supine position. The  left abdominal  wall was prepped with chlorhexidine in a sterile fashion, and a sterile drape was applied covering the operative field. A sterile gown and sterile gloves were used for the procedure. Local anesthesia was provided with 1% Lidocaine. Under CT guidance, a 17 gauge trocar needle was advanced to the level of a left-sided mesenteric mass. After confirming needle tip position, 4 separate coaxial 18 gauge core biopsy samples were obtained and submitted in saline. Some Gel-Foam pledgets were advanced through the outer needle as the needle was retracted and removed. COMPLICATIONS: None FINDINGS: Large left-sided intra-abdominal mass within the mesentery measures up to 13.3 cm in maximum diameter. Solid tissue was obtained. IMPRESSION: CT-guided core biopsy performed of large left-sided mesenteric mass measuring just over 13 cm in estimated maximal diameter. Electronically Signed   By: Aletta Edouard M.D.   On: 05/08/2020 15:02   ECHOCARDIOGRAM COMPLETE  Result Date: 05/22/2020    ECHOCARDIOGRAM REPORT   Patient Name:   Connor Adams [facility] Date of Exam: 05/22/2020 Medical Rec #:  [id number]        Height:       68.0 in Accession #:    [id number]       Weight:       171.0 lb Date of Birth:  [birth year]        BSA:          1.912 m Patient Age:    7 years         BP:           116/77 mmHg Patient Gender: M                HR:           80 bpm. Exam Location:  Outpatient Procedure: 2D Echo, Cardiac Doppler and Color Doppler Indications:    Chemotherapy evaluation v87.41 / v58.11  History:        Patient has no prior history of Echocardiogram examinations. TIA                 and Stroke; Risk Factors:Hypertension, Dyslipidemia and GERD.  Sonographer:    Bernadene Person RDCS Referring Phys: [id number] [place]  1. Left ventricular ejection fraction, by estimation, is 60 to 65%. The left ventricle has normal function. The left ventricle has no regional wall motion abnormalities. Left ventricular diastolic parameters are  consistent with Grade I diastolic dysfunction (impaired relaxation). The average left ventricular global longitudinal strain is -17.6 %. The global longitudinal strain is normal.  2. Right ventricular systolic function is normal. The right ventricular size is normal.  3. The mitral valve is normal in structure. No evidence of mitral valve regurgitation. No evidence of mitral stenosis.  4. The aortic valve is normal in structure. Aortic valve regurgitation is not visualized. No aortic stenosis is present.  5. The inferior vena cava is normal in size with greater than 50% respiratory variability, suggesting right atrial pressure of 3 mmHg. FINDINGS  Left Ventricle: Left ventricular ejection fraction, by estimation, is 60 to 65%. The left ventricle has normal function. The left ventricle has no regional wall motion abnormalities. The average left ventricular global longitudinal strain is -17.6 %. The global longitudinal strain is normal. The left ventricular internal cavity size was normal in size. There is no left ventricular hypertrophy. Left ventricular diastolic parameters are consistent with Grade I diastolic dysfunction (impaired relaxation). Right Ventricle: The right ventricular size is normal. No increase in right ventricular wall thickness.  Right ventricular systolic function is normal. Left Atrium: Left atrial size was normal in size. Right Atrium: Right atrial size was normal in size. Pericardium: There is no evidence of pericardial effusion. Mitral Valve: The mitral valve is normal in structure. Normal mobility of the mitral valve leaflets. No evidence of mitral valve regurgitation. No evidence of mitral valve stenosis. Tricuspid Valve: The tricuspid valve is normal in structure. Tricuspid valve regurgitation is not demonstrated. No evidence of tricuspid stenosis. Aortic Valve: The aortic valve is normal in structure. Aortic valve regurgitation is not visualized. No aortic stenosis is present. Pulmonic  Valve: The pulmonic valve was normal in structure. Pulmonic valve regurgitation is trivial. No evidence of pulmonic stenosis. Aorta: The aortic root is normal in size and structure. Venous: The inferior vena cava is normal in size with greater than 50% respiratory variability, suggesting right atrial pressure of 3 mmHg. IAS/Shunts: No atrial level shunt detected by color flow Doppler.  LEFT VENTRICLE PLAX 2D LVIDd:         5.10 cm  Diastology LVIDs:         3.20 cm  LV e' lateral:   9.94 cm/s LV PW:         0.70 cm  LV E/e' lateral: 4.6 LV IVS:        0.70 cm  LV e' medial:    6.85 cm/s LVOT diam:     2.10 cm  LV E/e' medial:  6.6 LV SV:         81 LV SV Index:   43       2D Longitudinal Strain LVOT Area:     3.46 cm 2D Strain GLS Avg:     -17.6 %  RIGHT VENTRICLE RV S prime:     9.79 cm/s TAPSE (M-mode): 1.6 cm LEFT ATRIUM             Index       RIGHT ATRIUM           Index LA diam:        3.70 cm 1.94 cm/m  RA Area:     14.40 cm LA Vol (A2C):   69.5 ml 36.35 ml/m RA Volume:   31.90 ml  16.68 ml/m LA Vol (A4C):   51.0 ml 26.67 ml/m LA Biplane Vol: 59.4 ml 31.07 ml/m  AORTIC VALVE LVOT Vmax:   143.00 cm/s LVOT Vmean:  98.900 cm/s LVOT VTI:    0.235 m  AORTA Ao Root diam: 3.20 cm Ao Asc diam:  3.40 cm MITRAL VALVE MV Area (PHT): 2.22 cm    SHUNTS MV Decel Time: 342 msec    Systemic VTI:  0.24 m MV E velocity: 45.30 cm/s  Systemic Diam: 2.10 cm MV A velocity: 72.50 cm/s MV E/A ratio:  0.62 Candee Furbish MD Electronically signed by Candee Furbish MD Signature Date/Time: 05/22/2020/12:55:40 PM    Final    IR IMAGING GUIDED PORT INSERTION  Result Date: 05/19/2020 INDICATION: History of diffuse large B-cell lymphoma. In need of durable intravenous access for chemotherapy administration EXAM: IMPLANTED PORT A CATH PLACEMENT WITH ULTRASOUND AND FLUOROSCOPIC GUIDANCE COMPARISON:  PET-CT-04/28/2020 MEDICATIONS: Ancef 2 gm IV; The antibiotic was administered within an appropriate time interval prior to skin puncture.  ANESTHESIA/SEDATION: Moderate (conscious) sedation was employed during this procedure. A total of Versed 2 mg and Fentanyl 100 mcg was administered intravenously. Moderate Sedation Time: 25 minutes. The patient's level of consciousness and vital signs were monitored continuously by radiology nursing throughout the procedure  under my direct supervision. CONTRAST:  None FLUOROSCOPY TIME:  24 seconds (5 mGy) COMPLICATIONS: None immediate. PROCEDURE: The procedure, risks, benefits, and alternatives were explained to the patient. Questions regarding the procedure were encouraged and answered. The patient understands and consents to the procedure. The right neck and chest were prepped with chlorhexidine in a sterile fashion, and a sterile drape was applied covering the operative field. Maximum barrier sterile technique with sterile gowns and gloves were used for the procedure. A timeout was performed prior to the initiation of the procedure. Local anesthesia was provided with 1% lidocaine with epinephrine. After creating a small venotomy incision, a micropuncture kit was utilized to access the internal jugular vein. Real-time ultrasound guidance was utilized for vascular access including the acquisition of a permanent ultrasound image documenting patency of the accessed vessel. The microwire was utilized to measure appropriate catheter length. A subcutaneous port pocket was then created along the upper chest wall utilizing a combination of sharp and blunt dissection. The pocket was irrigated with sterile saline. A single lumen "Slim" sized power injectable port was chosen for placement. The 8 Fr catheter was tunneled from the port pocket site to the venotomy incision. The port was placed in the pocket. The external catheter was trimmed to appropriate length. At the venotomy, an 8 Fr peel-away sheath was placed over a guidewire under fluoroscopic guidance. The catheter was then placed through the sheath and the sheath was  removed. Final catheter positioning was confirmed and documented with a fluoroscopic spot radiograph. The port was accessed with a Huber needle, aspirated and flushed with heparinized saline. The venotomy site was closed with an interrupted 4-0 Vicryl suture. The port pocket incision was closed with interrupted 2-0 Vicryl suture. The skin was opposed with a running subcuticular 4-0 Vicryl suture. Dermabond and Steri-strips were applied to both incisions. Dressings were applied. The patient tolerated the procedure well without immediate post procedural complication. FINDINGS: After catheter placement, the tip lies within the superior cavoatrial junction. The catheter aspirates and flushes normally and is ready for immediate use. IMPRESSION: Successful placement of a right internal jugular approach power injectable Port-A-Cath. The catheter is ready for immediate use. Electronically Signed   By: Sandi Mariscal M.D.   On: 05/19/2020 15:59     CT Biopsy  Result Date: 05/08/2020 CLINICAL DATA:  13 cm left-sided mesenteric abdominal mass. EXAM: CT GUIDED CORE BIOPSY OF MESENTERIC MASS ANESTHESIA/SEDATION: 1.5 mg IV Versed; 75 mcg IV Fentanyl Total Moderate Sedation Time:  18 minutes. The patient's level of consciousness and physiologic status were continuously monitored during the procedure by Radiology nursing. PROCEDURE: The procedure risks, benefits, and alternatives were explained to the patient. Questions regarding the procedure were encouraged and answered. The patient understands and consents to the procedure. A time-out was performed prior to initiating the procedure. CT was performed through the abdomen in a supine position. The left abdominal wall was prepped with chlorhexidine in a sterile fashion, and a sterile drape was applied covering the operative field. A sterile gown and sterile gloves were used for the procedure. Local anesthesia was provided with 1% Lidocaine. Under CT guidance, a 17 gauge trocar  needle was advanced to the level of a left-sided mesenteric mass. After confirming needle tip position, 4 separate coaxial 18 gauge core biopsy samples were obtained and submitted in saline. Some Gel-Foam pledgets were advanced through the outer needle as the needle was retracted and removed. COMPLICATIONS: None FINDINGS: Large left-sided intra-abdominal mass within the mesentery measures up  to 13.3 cm in maximum diameter. Solid tissue was obtained. IMPRESSION: CT-guided core biopsy performed of large left-sided mesenteric mass measuring just over 13 cm in estimated maximal diameter. Electronically Signed   By: Irish Lack M.D.   On: 05/08/2020 15:02   ECHOCARDIOGRAM COMPLETE  Result Date: 05/22/2020    ECHOCARDIOGRAM REPORT   Patient Name:   Connor Adams [facility] Date of Exam: 05/22/2020 Medical Rec #:  [id number]        Height:       68.0 in Accession #:    [id number]       Weight:       171.0 lb Date of Birth:  01-20-1946        BSA:          1.912 m Patient Age:    73 years         BP:           116/77 mmHg Patient Gender: M                HR:           80 bpm. Exam Location:  Outpatient Procedure: 2D Echo, Cardiac Doppler and Color Doppler Indications:    Chemotherapy evaluation v87.41 / v58.11  History:        Patient has no prior history of Echocardiogram examinations. TIA                 and Stroke; Risk Factors:Hypertension, Dyslipidemia and GERD.  Sonographer:    Eulah Pont RDCS Referring Phys: [id number] [place]qul[place] DORSEY IV IMPRESSIONS  1. Left ventricular ejection fraction, by estimation, is 60 to 65%. The left ventricle has normal function. The left ventricle has no regional wall motion abnormalities. Left ventricular diastolic parameters are consistent with Grade I diastolic dysfunction (impaired relaxation). The average left ventricular global longitudinal strain is -17.6 %. The global longitudinal strain is normal.  2. Right ventricular systolic function is normal. The right ventricular size is normal.   3. The mitral valve is normal in structure. No evidence of mitral valve regurgitation. No evidence of mitral stenosis.  4. The aortic valve is normal in structure. Aortic valve regurgitation is not visualized. No aortic stenosis is present.  5. The inferior vena cava is normal in size with greater than 50% respiratory variability, suggesting right atrial pressure of 3 mmHg. FINDINGS  Left Ventricle: Left ventricular ejection fraction, by estimation, is 60 to 65%. The left ventricle has normal function. The left ventricle has no regional wall motion abnormalities. The average left ventricular global longitudinal strain is -17.6 %. The global longitudinal strain is normal. The left ventricular internal cavity size was normal in size. There is no left ventricular hypertrophy. Left ventricular diastolic parameters are consistent with Grade I diastolic dysfunction (impaired relaxation). Right Ventricle: The right ventricular size is normal. No increase in right ventricular wall thickness. Right ventricular systolic function is normal. Left Atrium: Left atrial size was normal in size. Right Atrium: Right atrial size was normal in size. Pericardium: There is no evidence of pericardial effusion. Mitral Valve: The mitral valve is normal in structure. Normal mobility of the mitral valve leaflets. No evidence of mitral valve regurgitation. No evidence of mitral valve stenosis. Tricuspid Valve: The tricuspid valve is normal in structure. Tricuspid valve regurgitation is not demonstrated. No evidence of tricuspid stenosis. Aortic Valve: The aortic valve is normal in structure. Aortic valve regurgitation is not visualized. No aortic stenosis is present. Pulmonic Valve: The pulmonic valve  was normal in structure. Pulmonic valve regurgitation is trivial. No evidence of pulmonic stenosis. Aorta: The aortic root is normal in size and structure. Venous: The inferior vena cava is normal in size with greater than 50% respiratory  variability, suggesting right atrial pressure of 3 mmHg. IAS/Shunts: No atrial level shunt detected by color flow Doppler.  LEFT VENTRICLE PLAX 2D LVIDd:         5.10 cm  Diastology LVIDs:         3.20 cm  LV e' lateral:   9.94 cm/s LV PW:         0.70 cm  LV E/e' lateral: 4.6 LV IVS:        0.70 cm  LV e' medial:    6.85 cm/s LVOT diam:     2.10 cm  LV E/e' medial:  6.6 LV SV:         81 LV SV Index:   43       2D Longitudinal Strain LVOT Area:     3.46 cm 2D Strain GLS Avg:     -17.6 %  RIGHT VENTRICLE RV S prime:     9.79 cm/s TAPSE (M-mode): 1.6 cm LEFT ATRIUM             Index       RIGHT ATRIUM           Index LA diam:        3.70 cm 1.94 cm/m  RA Area:     14.40 cm LA Vol (A2C):   69.5 ml 36.35 ml/m RA Volume:   31.90 ml  16.68 ml/m LA Vol (A4C):   51.0 ml 26.67 ml/m LA Biplane Vol: 59.4 ml 31.07 ml/m  AORTIC VALVE LVOT Vmax:   143.00 cm/s LVOT Vmean:  98.900 cm/s LVOT VTI:    0.235 m  AORTA Ao Root diam: 3.20 cm Ao Asc diam:  3.40 cm MITRAL VALVE MV Area (PHT): 2.22 cm    SHUNTS MV Decel Time: 342 msec    Systemic VTI:  0.24 m MV E velocity: 45.30 cm/s  Systemic Diam: 2.10 cm MV A velocity: 72.50 cm/s MV E/A ratio:  0.62 Donato Schultz MD Electronically signed by Donato Schultz MD Signature Date/Time: 05/22/2020/12:55:40 PM    Final    IR IMAGING GUIDED PORT INSERTION  Result Date: 05/19/2020 INDICATION: History of diffuse large B-cell lymphoma. In need of durable intravenous access for chemotherapy administration EXAM: IMPLANTED PORT A CATH PLACEMENT WITH ULTRASOUND AND FLUOROSCOPIC GUIDANCE COMPARISON:  PET-CT-04/28/2020 MEDICATIONS: Ancef 2 gm IV; The antibiotic was administered within an appropriate time interval prior to skin puncture. ANESTHESIA/SEDATION: Moderate (conscious) sedation was employed during this procedure. A total of Versed 2 mg and Fentanyl 100 mcg was administered intravenously. Moderate Sedation Time: 25 minutes. The patient's level of consciousness and vital signs were monitored  continuously by radiology nursing throughout the procedure under my direct supervision. CONTRAST:  None FLUOROSCOPY TIME:  24 seconds (5 mGy) COMPLICATIONS: None immediate. PROCEDURE: The procedure, risks, benefits, and alternatives were explained to the patient. Questions regarding the procedure were encouraged and answered. The patient understands and consents to the procedure. The right neck and chest were prepped with chlorhexidine in a sterile fashion, and a sterile drape was applied covering the operative field. Maximum barrier sterile technique with sterile gowns and gloves were used for the procedure. A timeout was performed prior to the initiation of the procedure. Local anesthesia was provided with 1% lidocaine with epinephrine. After creating a  small venotomy incision, a micropuncture kit was utilized to access the internal jugular vein. Real-time ultrasound guidance was utilized for vascular access including the acquisition of a permanent ultrasound image documenting patency of the accessed vessel. The microwire was utilized to measure appropriate catheter length. A subcutaneous port pocket was then created along the upper chest wall utilizing a combination of sharp and blunt dissection. The pocket was irrigated with sterile saline. A single lumen "Slim" sized power injectable port was chosen for placement. The 8 Fr catheter was tunneled from the port pocket site to the venotomy incision. The port was placed in the pocket. The external catheter was trimmed to appropriate length. At the venotomy, an 8 Fr peel-away sheath was placed over a guidewire under fluoroscopic guidance. The catheter was then placed through the sheath and the sheath was removed. Final catheter positioning was confirmed and documented with a fluoroscopic spot radiograph. The port was accessed with a Huber needle, aspirated and flushed with heparinized saline. The venotomy site was closed with an interrupted 4-0 Vicryl suture. The  port pocket incision was closed with interrupted 2-0 Vicryl suture. The skin was opposed with a running subcuticular 4-0 Vicryl suture. Dermabond and Steri-strips were applied to both incisions. Dressings were applied. The patient tolerated the procedure well without immediate post procedural complication. FINDINGS: After catheter placement, the tip lies within the superior cavoatrial junction. The catheter aspirates and flushes normally and is ready for immediate use. IMPRESSION: Successful placement of a right internal jugular approach power injectable Port-A-Cath. The catheter is ready for immediate use. Electronically Signed   By: Sandi Mariscal M.D.   On: 05/19/2020 15:59    Assessment and Plan:  SHAHZAD THOMANN 73 y.o. male with medical history significant for Stage I DLBCL who presents for a follow up visit.  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  He is currently receiving cycle #2 of EPOCH-R starting on 06/16/2020.  Today he is Cycle 3 Day 2 of treatment.  He is tolerating his chemotherapy well so far with no nausea or vomiting.  Bowels are moving and he has a good appetite.  There are no barriers to proceeding with treatment.      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 Stage I bulky disease.   --Continue EPOCH-R. Today is Cycle 3 Day 4 --Adverse effects have been reviewed including, but not limited to, alopecia, myelosuppression, peripheral neuropathy, mucositis, liver/renal dysfunction, and possible infusion reaction associated with Rituxan. He agrees to proceed. --PAC in place and ready for use. --Echocardiogram  from 05/22/2020 shows LVEF of 60-65%  --CBC adequate to proceed with treatment today.  Leukocytosis likely related to steroids.  Anemia remains overall stable. --Will monitor closely with CBC with diff, CMET, LDH, and uric acid --continue Allopurinol 300 mg daily.  --Lovenox for DVT prophylaxis. --Continue home medications for HTN and hyperlipidemia.  --Continue PRN antiemetics. --Continue MiraLAX and Senokot. -Anticipate hospital discharge on Friday, 07/11/2020.  The patient will return to the cancer center on 07/14/2020 for Rituxan and Neulasta and will have labs and a follow-up visit on 07/16/2020.  [name], DNP, AGPCNP-BC, AOCNP Mon/Tues/Thurs/Fri 7am-5pm; Off Wednesdays Cell: [phone]  ADDENDUM  .Patient was Personally and independently interviewed, examined and relevant elements of the history of present illness were reviewed in details and an assessment and plan was created. All elements of the patient's history of present illness , assessment and plan were discussed in details with [name], DNP, AGPCNP-BC, AOCNP. The above documentation reflects our combined findings assessment and plan.  -consider  Discontinuation of Allopurinol since the risk of TLS would be low at this time. -would recommend rpt PET/CT after 3 cycles of EPOCH-R but will defer to Dr Lorenso Courier.  Sullivan Lone MD MS

## 2020-07-11 ENCOUNTER — Other Ambulatory Visit: Payer: Self-pay | Admitting: *Deleted

## 2020-07-11 DIAGNOSIS — C8333 Diffuse large B-cell lymphoma, intra-abdominal lymph nodes: Secondary | ICD-10-CM

## 2020-07-11 DIAGNOSIS — Z5111 Encounter for antineoplastic chemotherapy: Secondary | ICD-10-CM | POA: Diagnosis not present

## 2020-07-11 LAB — COMPREHENSIVE METABOLIC PANEL
ALT: 25 U/L (ref 0–44)
AST: 15 U/L (ref 15–41)
Albumin: 3 g/dL — ABNORMAL LOW (ref 3.5–5.0)
Alkaline Phosphatase: 49 U/L (ref 38–126)
Anion gap: 7 (ref 5–15)
BUN: 32 mg/dL — ABNORMAL HIGH (ref 8–23)
CO2: 25 mmol/L (ref 22–32)
Calcium: 8.3 mg/dL — ABNORMAL LOW (ref 8.9–10.3)
Chloride: 108 mmol/L (ref 98–111)
Creatinine, Ser: 1.04 mg/dL (ref 0.61–1.24)
GFR, Estimated: >60 mL/min (ref >60)
Glucose, Bld: 129 mg/dL — ABNORMAL HIGH (ref 70–99)
Potassium: 3.7 mmol/L (ref 3.5–5.1)
Sodium: 140 mmol/L (ref 135–145)
Total Bilirubin: 0.6 mg/dL (ref 0.3–1.2)
Total Protein: 5.1 g/dL — ABNORMAL LOW (ref 6.5–8.1)

## 2020-07-11 LAB — CBC WITH DIFFERENTIAL/PLATELET
Abs Immature Granulocytes: 0.07 10*3/uL (ref 0.00–0.07)
Basophils Absolute: 0 10*3/uL (ref 0.0–0.1)
Basophils Relative: 0 %
Eosinophils Absolute: 0 10*3/uL (ref 0.0–0.5)
Eosinophils Relative: 0 %
HCT: 26 % — ABNORMAL LOW (ref 39.0–52.0)
Hemoglobin: 8.4 g/dL — ABNORMAL LOW (ref 13.0–17.0)
Immature Granulocytes: 1 %
Lymphocytes Relative: 4 %
Lymphs Abs: 0.4 10*3/uL — ABNORMAL LOW (ref 0.7–4.0)
MCH: 30 pg (ref 26.0–34.0)
MCHC: 32.3 g/dL (ref 30.0–36.0)
MCV: 92.9 fL (ref 80.0–100.0)
Monocytes Absolute: 0.2 10*3/uL (ref 0.1–1.0)
Monocytes Relative: 2 %
Neutro Abs: 9.5 10*3/uL — ABNORMAL HIGH (ref 1.7–7.7)
Neutrophils Relative %: 93 %
Platelets: 328 10*3/uL (ref 150–400)
RBC: 2.8 MIL/uL — ABNORMAL LOW (ref 4.22–5.81)
RDW: 20.6 % — ABNORMAL HIGH (ref 11.5–15.5)
WBC: 10.2 10*3/uL (ref 4.0–10.5)
nRBC: 0.2 % (ref 0.0–0.2)

## 2020-07-11 LAB — URIC ACID: Uric Acid, Serum: 4.8 mg/dL (ref 3.7–8.6)

## 2020-07-11 LAB — LACTATE DEHYDROGENASE: LDH: 107 U/L (ref 98–192)

## 2020-07-11 MED ORDER — SODIUM CHLORIDE 0.9 % IV SOLN
750.0000 mg/m2 | Freq: Once | INTRAVENOUS | Status: AC
  Administered 2020-07-11: 1440 mg via INTRAVENOUS
  Filled 2020-07-11: qty 72

## 2020-07-11 MED ORDER — SODIUM CHLORIDE 0.9 % IV SOLN
Freq: Once | INTRAVENOUS | Status: AC
  Administered 2020-07-11: 36 mg via INTRAVENOUS
  Filled 2020-07-11: qty 8

## 2020-07-11 MED ORDER — HEPARIN SOD (PORK) LOCK FLUSH 100 UNIT/ML IV SOLN
500.0000 [IU] | Freq: Once | INTRAVENOUS | Status: AC
  Administered 2020-07-11: 500 [IU] via INTRAVENOUS
  Filled 2020-07-11: qty 5

## 2020-07-11 NOTE — Discharge Summary (Addendum)
Physician Discharge Summary  Patient ID: JORDELL OUTTEN MRN: [id number] DOB/AGE: December 28, 1945 74 y.o.  Admit date: 07/07/2020 Discharge date: 07/11/2020  Discharge Diagnoses:  Active Problems:   DLBCL (diffuse large B cell lymphoma) (HCC)   Encounter for antineoplastic chemotherapy   Diffuse large b-cell lymphoma, intra-abdominal lymph nodes (HCC)  Discharged Condition: good  Discharge Labs:  None pending.   Significant Diagnostic Studies:   CBC Latest Ref Rng & Units 07/11/2020 07/10/2020 07/09/2020  WBC 4.0 - 10.5 K/uL 10.2 16.2(H) 24.4(H)  Hemoglobin 13.0 - 17.0 g/dL 8.4(L) 8.4(L) 8.5(L)  Hematocrit 39 - 52 % 26.0(L) 26.8(L) 26.7(L)  Platelets 150 - 400 K/uL 328 326 347   CMP Latest Ref Rng & Units 07/11/2020 07/10/2020 07/09/2020  Glucose 70 - 99 mg/dL 129(H) 139(H) 133(H)  BUN 8 - 23 mg/dL 32(H) 33(H) 30(H)  Creatinine 0.61 - 1.24 mg/dL 1.04 1.18 1.16  Sodium 135 - 145 mmol/L 140 139 141  Potassium 3.5 - 5.1 mmol/L 3.7 3.8 4.4  Chloride 98 - 111 mmol/L 108 109 110  CO2 22 - 32 mmol/L 25 23 23   Calcium 8.9 - 10.3 mg/dL 8.3(L) 8.2(L) 8.4(L)  Total Protein 6.5 - 8.1 g/dL 5.1(L) 5.4(L) 5.5(L)  Total Bilirubin 0.3 - 1.2 mg/dL 0.6 0.7 0.7  Alkaline Phos 38 - 126 U/L 49 58 62  AST 15 - 41 U/L 15 19 32  ALT 0 - 44 U/L 25 28 24    Consults: None  Procedures: None  Disposition: Discharge disposition: 01-Home or Self Care      Day of Discharge Physical Exam:  General:  well-nourished in no acute distress.   Eyes:  no scleral icterus.   Respiratory: lungs were clear bilaterally without wheezing or crackles.   Cardiovascular:  Regular rate and rhythm, S1/S2, without murmur, rub or gallop. There was no pedal edema.   GI:  Positive bowel sounds.  Left upper quadrant abdominal mass nonpalpable.   Musculoskeletal:  no spinal tenderness of palpation of vertebral spine.   Skin exam was without echymosis, petichae.   Neuro exam was nonfocal. Patient was alert and oriented.   Attention was good.  Language was appropriate.  Mood was normal without depression.  Speech was not pressured.  Thought content was not tangential.    Allergies as of 07/11/2020   No Known Allergies     Medication List    TAKE these medications   acetaminophen 325 MG tablet Commonly known as: TYLENOL Take 2 tablets (650 mg total) by mouth every 6 (six) hours as needed for mild pain, moderate pain or fever.   allopurinol 300 MG tablet Commonly known as: ZYLOPRIM Take 1 tablet (300 mg total) by mouth daily.   Cozaar 25 MG tablet Generic drug: losartan Take 25 mg by mouth daily.   Crestor 5 MG tablet Generic drug: rosuvastatin Take 5 mg by mouth daily.   diclofenac Sodium 1 % Gel Commonly known as: VOLTAREN Apply 2 g topically 4 (four) times daily as needed (pain).   feeding supplement Liqd Take 237 mLs by mouth 2 (two) times daily between meals.   fluticasone 50 MCG/ACT nasal spray Commonly known as: FLONASE Place 1-2 sprays into both nostrils daily as needed for allergies or rhinitis.   hydrocortisone 2.5 % rectal cream Commonly known as: ANUSOL-HC Place 1 application rectally daily as needed. What changed: reasons to take this   ibuprofen 200 MG tablet Commonly known as: ADVIL Take 400 mg by mouth every 6 (six) hours as needed for fever,  headache or mild pain.   multivitamins ther. w/minerals Tabs tablet Take 1 tablet by mouth daily.   ondansetron 8 MG disintegrating tablet Commonly known as: Zofran ODT Take 1 tablet (8 mg total) by mouth every 8 (eight) hours as needed for nausea or vomiting.   pantoprazole 40 MG tablet Commonly known as: PROTONIX Take 1 tablet (40 mg total) by mouth 2 (two) times daily.   polyethylene glycol 17 g packet Commonly known as: MIRALAX / GLYCOLAX Take 17 g by mouth daily as needed for moderate constipation.   polyvinyl alcohol 1.4 % ophthalmic solution Commonly known as: LIQUIFILM TEARS Place 1 drop into both eyes as needed  for dry eyes.   prochlorperazine 10 MG tablet Commonly known as: COMPAZINE Take 1 tablet (10 mg total) by mouth every 6 (six) hours as needed for nausea or vomiting.   promethazine 25 MG tablet Commonly known as: PHENERGAN Take 1 tablet (25 mg total) by mouth every 6 (six) hours as needed for nausea or vomiting.   senna-docusate 8.6-50 MG tablet Commonly known as: Senokot-S Take 2 tablets by mouth 2 (two) times daily as needed for mild constipation.   sodium bicarbonate/sodium chloride Soln 1 application by Mouth Rinse route as needed for dry mouth.   sucralfate 1 g tablet Commonly known as: Carafate Take 1 tablet (1 g total) by mouth 4 (four) times daily -  with meals and at bedtime.        Hospital Course: Mr. Spalla is a 74 year old male with medical history significant for double hit DLBCL.  He presented to the hospital on 07/07/2020 for cycle 3-day 1 of EPOCH-R chemotherapy.  Chemotherapy started as planned on 07/07/2020.  Throughout the course of the patient's admission he has had no complications.  Bowels have been moving without any difficulty with the addition of scheduled MiraLAX and Senokot-S. He was able to tolerate food well without difficulty.  He did not have any issues with fevers, chills, sweats, nausea, vomiting or diarrhea.  He successfully completed all chemotherapy at full dose as prescribed. On the final day he had no concerning symptoms.    He is currently scheduled for G-CSF and rituximab at the cancer center on 07/14/2020.  He will have a follow-up visit in our office on 07/16/2020 with repeat lab work.  He has our contact information and has been instructed to contact us in the event he were to develop any new concerning symptoms or issues in the interim.  He was discharged on 07/11/2020 in excellent condition.   Discharge Instructions    Activity as tolerated - No restrictions   Complete by: As directed    Diet Carb Modified   Complete by: As directed        [name], DNP, AGPCNP-BC, AOCNP Mon/Tues/Thurs/Fri 7am-5pm; Off Wednesdays Cell: [phone]   ADDENDUM  .Patient was Personally and independently interviewed, examined and relevant elements of the discharge plan were  Reviewed in details with [name], DNP, AGPCNP-BC, AOCNP . The above documentation reflects our combined findings assessment and plan.  Sullivan Lone MD MS TT>74mins

## 2020-07-14 ENCOUNTER — Inpatient Hospital Stay: Payer: Medicare PPO

## 2020-07-14 ENCOUNTER — Other Ambulatory Visit: Payer: Self-pay

## 2020-07-14 VITALS — BP 125/82 | HR 66 | Temp 98.1°F | Resp 16 | Wt 169.8 lb

## 2020-07-14 DIAGNOSIS — C8333 Diffuse large B-cell lymphoma, intra-abdominal lymph nodes: Secondary | ICD-10-CM

## 2020-07-14 DIAGNOSIS — C8338 Diffuse large B-cell lymphoma, lymph nodes of multiple sites: Secondary | ICD-10-CM | POA: Diagnosis not present

## 2020-07-14 DIAGNOSIS — Z79899 Other long term (current) drug therapy: Secondary | ICD-10-CM | POA: Diagnosis not present

## 2020-07-14 DIAGNOSIS — Z95828 Presence of other vascular implants and grafts: Secondary | ICD-10-CM

## 2020-07-14 DIAGNOSIS — Z5111 Encounter for antineoplastic chemotherapy: Secondary | ICD-10-CM | POA: Diagnosis not present

## 2020-07-14 LAB — CMP (CANCER CENTER ONLY)
ALT: 18 U/L (ref 0–44)
AST: 13 U/L — ABNORMAL LOW (ref 15–41)
Albumin: 3.3 g/dL — ABNORMAL LOW (ref 3.5–5.0)
Alkaline Phosphatase: 63 U/L (ref 38–126)
Anion gap: 9 (ref 5–15)
BUN: 25 mg/dL — ABNORMAL HIGH (ref 8–23)
CO2: 26 mmol/L (ref 22–32)
Calcium: 8.8 mg/dL — ABNORMAL LOW (ref 8.9–10.3)
Chloride: 105 mmol/L (ref 98–111)
Creatinine: 0.94 mg/dL (ref 0.61–1.24)
GFR, Estimated: >60 mL/min (ref >60)
Glucose, Bld: 120 mg/dL — ABNORMAL HIGH (ref 70–99)
Potassium: 3.9 mmol/L (ref 3.5–5.1)
Sodium: 140 mmol/L (ref 135–145)
Total Bilirubin: 0.7 mg/dL (ref 0.3–1.2)
Total Protein: 5.5 g/dL — ABNORMAL LOW (ref 6.5–8.1)

## 2020-07-14 LAB — URIC ACID: Uric Acid, Serum: 4.6 mg/dL (ref 3.7–8.6)

## 2020-07-14 LAB — CBC WITH DIFFERENTIAL (CANCER CENTER ONLY)
Abs Immature Granulocytes: 0.02 10*3/uL (ref 0.00–0.07)
Basophils Absolute: 0 10*3/uL (ref 0.0–0.1)
Basophils Relative: 0 %
Eosinophils Absolute: 0.1 10*3/uL (ref 0.0–0.5)
Eosinophils Relative: 3 %
HCT: 28.2 % — ABNORMAL LOW (ref 39.0–52.0)
Hemoglobin: 9.2 g/dL — ABNORMAL LOW (ref 13.0–17.0)
Immature Granulocytes: 0 %
Lymphocytes Relative: 3 %
Lymphs Abs: 0.2 10*3/uL — ABNORMAL LOW (ref 0.7–4.0)
MCH: 28.9 pg (ref 26.0–34.0)
MCHC: 32.6 g/dL (ref 30.0–36.0)
MCV: 88.7 fL (ref 80.0–100.0)
Monocytes Absolute: 0 10*3/uL — ABNORMAL LOW (ref 0.1–1.0)
Monocytes Relative: 0 %
Neutro Abs: 5 10*3/uL (ref 1.7–7.7)
Neutrophils Relative %: 94 %
Platelet Count: 299 10*3/uL (ref 150–400)
RBC: 3.18 MIL/uL — ABNORMAL LOW (ref 4.22–5.81)
RDW: 20.4 % — ABNORMAL HIGH (ref 11.5–15.5)
WBC Count: 5.3 10*3/uL (ref 4.0–10.5)
nRBC: 0 % (ref 0.0–0.2)

## 2020-07-14 LAB — LACTATE DEHYDROGENASE: LDH: 211 U/L — ABNORMAL HIGH (ref 98–192)

## 2020-07-14 MED ORDER — ACETAMINOPHEN 325 MG PO TABS
650.0000 mg | ORAL_TABLET | Freq: Once | ORAL | Status: AC
  Administered 2020-07-14: 650 mg via ORAL

## 2020-07-14 MED ORDER — ACETAMINOPHEN 325 MG PO TABS
ORAL_TABLET | ORAL | Status: AC
  Filled 2020-07-14: qty 2

## 2020-07-14 MED ORDER — SODIUM CHLORIDE 0.9% FLUSH
10.0000 mL | Freq: Once | INTRAVENOUS | Status: AC
  Administered 2020-07-14: 10 mL via INTRAVENOUS
  Filled 2020-07-14: qty 10

## 2020-07-14 MED ORDER — PEGFILGRASTIM-CBQV 6 MG/0.6ML ~~LOC~~ SOSY
PREFILLED_SYRINGE | SUBCUTANEOUS | Status: AC
  Filled 2020-07-14: qty 0.6

## 2020-07-14 MED ORDER — SODIUM CHLORIDE 0.9% FLUSH
10.0000 mL | Freq: Once | INTRAVENOUS | Status: AC
  Administered 2020-07-14: 10 mL
  Filled 2020-07-14: qty 10

## 2020-07-14 MED ORDER — ACETAMINOPHEN 500 MG PO TABS
ORAL_TABLET | ORAL | Status: AC
  Filled 2020-07-14: qty 2

## 2020-07-14 MED ORDER — DIPHENHYDRAMINE HCL 25 MG PO CAPS
ORAL_CAPSULE | ORAL | Status: AC
  Filled 2020-07-14: qty 1

## 2020-07-14 MED ORDER — HEPARIN SOD (PORK) LOCK FLUSH 100 UNIT/ML IV SOLN
500.0000 [IU] | Freq: Once | INTRAVENOUS | Status: AC
  Administered 2020-07-14: 500 [IU] via INTRAVENOUS
  Filled 2020-07-14: qty 5

## 2020-07-14 MED ORDER — SODIUM CHLORIDE 0.9 % IV SOLN
375.0000 mg/m2 | Freq: Once | INTRAVENOUS | Status: AC
  Administered 2020-07-14: 700 mg via INTRAVENOUS
  Filled 2020-07-14: qty 50

## 2020-07-14 MED ORDER — DIPHENHYDRAMINE HCL 25 MG PO CAPS
25.0000 mg | ORAL_CAPSULE | Freq: Once | ORAL | Status: AC
  Administered 2020-07-14: 25 mg via ORAL

## 2020-07-14 MED ORDER — PEGFILGRASTIM-CBQV 6 MG/0.6ML ~~LOC~~ SOSY
6.0000 mg | PREFILLED_SYRINGE | Freq: Once | SUBCUTANEOUS | Status: AC
  Administered 2020-07-14: 6 mg via SUBCUTANEOUS

## 2020-07-14 MED ORDER — SODIUM CHLORIDE 0.9 % IV SOLN
Freq: Once | INTRAVENOUS | Status: AC
  Filled 2020-07-14: qty 250

## 2020-07-14 NOTE — Patient Instructions (Signed)
Rituximab; Hyaluronidase injection What is this medicine? RITUXIMAB; HYALURONIDASE (ri TUX i mab / hye al ur ON i dase) is used to treat non-Hodgkin lymphoma and chronic lymphocytic leukemia. Rituximab is a monoclonal antibody. Hyaluronidase is used to improve the effects of rituximab. This medicine may be used for other purposes; ask your health care provider or pharmacist if you have questions. COMMON BRAND NAME(S): Rituxan Hycela What should I tell my health care provider before I take this medicine? They need to know if you have any of these conditions:  heart disease  infection (especially a virus infection such as hepatitis B, chickenpox, cold sores, or herpes)  immune system problems  irregular heartbeat  kidney disease  lung or breathing disease, like asthma  recently received or scheduled to receive a vaccine  an unusual or allergic reaction to rituximab, rituximab/hyaluronidase, mouse proteins, other medicines, foods, dyes, or preservatives  pregnant or trying to get pregnant  breast-feeding How should I use this medicine? This medicine is for injection under the skin. It is given by a health care professional in a hospital or clinic setting. A special MedGuide will be given to you before each treatment. Be sure to read this information carefully each time. Talk to your pediatrician regarding the use of this medicine in children. Special care may be needed. Overdosage: If you think you have taken too much of this medicine contact a poison control center or emergency room at once. NOTE: This medicine is only for you. Do not share this medicine with others. What if I miss a dose? It is important not to miss your dose. Call your doctor or health care professional if you are unable to keep an appointment. What may interact with this medicine? This medicine may interact with the following medications:  cisplatin  live virus vaccines This list may not describe all  possible interactions. Give your health care provider a list of all the medicines, herbs, non-prescription drugs, or dietary supplements you use. Also tell them if you smoke, drink alcohol, or use illegal drugs. Some items may interact with your medicine. What should I watch for while using this medicine? Your condition will be monitored carefully while you are receiving this medicine. You may need blood work done while you are taking this medicine. This medicine can cause serious allergic reactions. To reduce your risk you may need to take medicine before treatment with this medicine. Take your medicine as directed. In some patients, this medicine may cause a serious brain infection that may cause death. If you have any problems seeing, thinking, speaking, walking, or standing, tell your doctor right away. If you cannot reach your doctor, urgently seek other source of medical care. Call your doctor or health care professional for advice if you get a fever, chills or sore throat, or other symptoms of a cold or flu. Do not treat yourself. This drug decreases your body's ability to fight infections. Try to avoid being around people who are sick. Do not become pregnant while taking this medicine or for 12 months after stopping it. Women should inform their doctor if they wish to become pregnant or think they might be pregnant. There is a potential for serious side effects to an unborn child. Talk to your health care professional or pharmacist for more information. Do not breast-feed an infant while taking this medicine or for at least 6 months after stopping it. What side effects may I notice from receiving this medicine? Side effects that you should report   to your doctor or health care professional as soon as possible:  allergic reactions like skin rash, itching or hives; swelling of the face, lips, or tongue  breathing problems  chest pain  changes in vision  diarrhea  dizziness  headache with  fever, neck stiffness, sensitivity to light, nausea, or confusion  fast, irregular heartbeat  loss of memory  low blood counts - this medicine may decrease the number of white blood cells, red blood cells and platelets. You may be at increased risk for infections and bleeding.  mouth sores  problems with balance, talking, or walking  redness, blistering, peeling or loosening of the skin, including inside the mouth  signs of infection - fever or chills, cough, sore throat, pain or difficulty passing urine  signs and symptoms of kidney injury like trouble passing urine or change in the amount of urine  signs and symptoms of liver injury like dark yellow or brown urine; general ill feeling or flu-like symptoms; light-colored stools; loss of appetite; nausea; right upper belly pain; unusually weak or tired; yellowing of the eyes or skin  stomach pain  swelling of the ankles, feet, hands  unusual bleeding or bruising  vomiting Side effects that usually do not require medical attention (report these to your doctor or health care professional if they continue or are bothersome):  headache  joint pain  muscle cramps or muscle pain  nausea  pain, redness, or irritation at site where injected  tiredness This list may not describe all possible side effects. Call your doctor for medical advice about side effects. You may report side effects to FDA at 1-800-FDA-1088. Where should I keep my medicine? This drug is given in a hospital or clinic and will not be stored at home. NOTE: This sheet is a summary. It may not cover all possible information. If you have questions about this medicine, talk to your doctor, pharmacist, or health care provider.  2020 Elsevier/Gold Standard (2017-08-19 13:12:25)  Pegfilgrastim injection What is this medicine? PEGFILGRASTIM (PEG fil gra stim) is a long-acting granulocyte colony-stimulating factor that stimulates the growth of neutrophils, a type of  white blood cell important in the body's fight against infection. It is used to reduce the incidence of fever and infection in patients with certain types of cancer who are receiving chemotherapy that affects the bone marrow, and to increase survival after being exposed to high doses of radiation. This medicine may be used for other purposes; ask your health care provider or pharmacist if you have questions. COMMON BRAND NAME(S): Steve Rattler, Ziextenzo What should I tell my health care provider before I take this medicine? They need to know if you have any of these conditions:  kidney disease  latex allergy  ongoing radiation therapy  sickle cell disease  skin reactions to acrylic adhesives (On-Body Injector only)  an unusual or allergic reaction to pegfilgrastim, filgrastim, other medicines, foods, dyes, or preservatives  pregnant or trying to get pregnant  breast-feeding How should I use this medicine? This medicine is for injection under the skin. If you get this medicine at home, you will be taught how to prepare and give the pre-filled syringe or how to use the On-body Injector. Refer to the patient Instructions for Use for detailed instructions. Use exactly as directed. Tell your healthcare provider immediately if you suspect that the On-body Injector may not have performed as intended or if you suspect the use of the On-body Injector resulted in a missed or partial dose.  It is important that you put your used needles and syringes in a special sharps container. Do not put them in a trash can. If you do not have a sharps container, call your pharmacist or healthcare provider to get one. Talk to your pediatrician regarding the use of this medicine in children. While this drug may be prescribed for selected conditions, precautions do apply. Overdosage: If you think you have taken too much of this medicine contact a poison control center or emergency room at once. NOTE:  This medicine is only for you. Do not share this medicine with others. What if I miss a dose? It is important not to miss your dose. Call your doctor or health care professional if you miss your dose. If you miss a dose due to an On-body Injector failure or leakage, a new dose should be administered as soon as possible using a single prefilled syringe for manual use. What may interact with this medicine? Interactions have not been studied. Give your health care provider a list of all the medicines, herbs, non-prescription drugs, or dietary supplements you use. Also tell them if you smoke, drink alcohol, or use illegal drugs. Some items may interact with your medicine. This list may not describe all possible interactions. Give your health care provider a list of all the medicines, herbs, non-prescription drugs, or dietary supplements you use. Also tell them if you smoke, drink alcohol, or use illegal drugs. Some items may interact with your medicine. What should I watch for while using this medicine? You may need blood work done while you are taking this medicine. If you are going to need a MRI, CT scan, or other procedure, tell your doctor that you are using this medicine (On-Body Injector only). What side effects may I notice from receiving this medicine? Side effects that you should report to your doctor or health care professional as soon as possible:  allergic reactions like skin rash, itching or hives, swelling of the face, lips, or tongue  back pain  dizziness  fever  pain, redness, or irritation at site where injected  pinpoint red spots on the skin  red or dark-brown urine  shortness of breath or breathing problems  stomach or side pain, or pain at the shoulder  swelling  tiredness  trouble passing urine or change in the amount of urine Side effects that usually do not require medical attention (report to your doctor or health care professional if they continue or are  bothersome):  bone pain  muscle pain This list may not describe all possible side effects. Call your doctor for medical advice about side effects. You may report side effects to FDA at 1-800-FDA-1088. Where should I keep my medicine? Keep out of the reach of children. If you are using this medicine at home, you will be instructed on how to store it. Throw away any unused medicine after the expiration date on the label. NOTE: This sheet is a summary. It may not cover all possible information. If you have questions about this medicine, talk to your doctor, pharmacist, or health care provider.  2020 Elsevier/Gold Standard (2017-12-12 16:57:08)

## 2020-07-14 NOTE — Patient Instructions (Signed)

## 2020-07-16 ENCOUNTER — Inpatient Hospital Stay (HOSPITAL_BASED_OUTPATIENT_CLINIC_OR_DEPARTMENT_OTHER): Payer: Medicare PPO | Admitting: Hematology and Oncology

## 2020-07-16 ENCOUNTER — Other Ambulatory Visit: Payer: Self-pay

## 2020-07-16 VITALS — BP 102/63 | HR 90 | Temp 99.2°F | Resp 18 | Ht 68.0 in | Wt 166.2 lb

## 2020-07-16 DIAGNOSIS — Z5111 Encounter for antineoplastic chemotherapy: Secondary | ICD-10-CM | POA: Diagnosis not present

## 2020-07-16 DIAGNOSIS — Z95828 Presence of other vascular implants and grafts: Secondary | ICD-10-CM

## 2020-07-16 DIAGNOSIS — C8333 Diffuse large B-cell lymphoma, intra-abdominal lymph nodes: Secondary | ICD-10-CM | POA: Diagnosis not present

## 2020-07-16 DIAGNOSIS — C8338 Diffuse large B-cell lymphoma, lymph nodes of multiple sites: Secondary | ICD-10-CM | POA: Diagnosis not present

## 2020-07-16 DIAGNOSIS — Z79899 Other long term (current) drug therapy: Secondary | ICD-10-CM | POA: Diagnosis not present

## 2020-07-16 DIAGNOSIS — R1901 Right upper quadrant abdominal swelling, mass and lump: Secondary | ICD-10-CM | POA: Diagnosis not present

## 2020-07-17 ENCOUNTER — Encounter: Payer: Self-pay | Admitting: Hematology and Oncology

## 2020-07-17 ENCOUNTER — Other Ambulatory Visit: Payer: Self-pay | Admitting: Hematology and Oncology

## 2020-07-17 NOTE — Progress Notes (Signed)
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   1) 04/17/2020:  CT A/P showed dominant nodal mass (11.5 x 10.9 cm) within the jejunal mesentery, highly suspicious for lymphoma. Additionally there are left lower lobe pleural-based pulmonary nodules 2) 04/24/2020: establish care with Dr. Lorenso Courier 3) 04/28/2020:  PET CT scan shows Large solid left mesenteric mass 13.0 cm with maximum SUV of 21.2, [place] 5 with local hypermetabolic porta hepatis and retroperitoneal lymph nodes. Constitutes bulky Stage I disease.  4) 05/27/2020-05/31/2020: Cycle 1 of R-EPOCH 5) 06/16/2020-06/23/2020: Cycle 2 of R-EPOCH 6) 07/07/2020-07/11/2020: Cycle 3 of R-EPOCH  Interval History:  Connor Adams 74 y.o. male with medical history significant for Stage I DLBCL who presents for a follow up visit. The patient was last seen while in house from 07/07/2020-07/11/2020 for R-EPOCH. In the interim since then he received rituximab and GCSF therapy on 07/14/2020.  On exam today Fellers notes he has been well since his last dose of chemotherapy.  He reports that after the steroids were discontinued on Friday he had a bit of a "crash" with low energy beginning of this week.  He is otherwise been quite well with no fevers, chills, sweats, nausea, vomiting or diarrhea.  His appetite remains excellent.  He tolerated his rituximab and G-CSF shot quite well on Monday.  He is not noticed any new lymphadenopathy or swelling in his stomach.  He is actually been gaining weight and is up to 166 pounds which is 6 pounds up from earlier this month.  He is continuing to be optimistic and willing and able to proceed with treatment.  He is also accompanied by his wife.  They had opportunity to ask questions all of which were  addressed.  A full 10 point ROS is listed below.  MEDICAL HISTORY:  Past Medical History:  Diagnosis Date  . Arthritis   . Chronic kidney disease (CKD), stage III (moderate) (HCC)   . COVID-19   . GERD (gastroesophageal reflux disease)    occ  . Hemorrhoids   . History of ETT 3/05   low risk  . History of hiatal hernia   . History of kidney stones   . HLD (hyperlipidemia)   . HTN (hypertension)   . Left inguinal hernia   . Melanoma ([place])    excied  . 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 Stroke [facility]) 2011   tia   . TIA (transient ischemic attack) 4/11   associated w slurred speecha ndmild facial droop. lasted for about 10 minutes. Had MRI, etc with [facility] that per his report was ok (dr. Stephannie Li). Echo (4/11): EF 55-60%, no regional WMAs, normal diastolic function, mild LAE, no source of embolus    SURGICAL HISTORY: Past Surgical History:  Procedure Laterality Date  . BMI 30.2 muscular  12/08/04  . curretage actinic keratosis R ear  01/20/05  . ETT low prob of CAD  11/21/03  . excision melanoma in situ back  01/20/05  . hemorrhoids sclerosed at colonoscopy  12/19/05  . IR IMAGING GUIDED PORT INSERTION  05/19/2020  . LAPAROSCOPIC INGUINAL HERNIA REPAIR Left 12/20/1995  . LUMBAR DISC SURGERY    . NERVE REPAIR     back of neck  . retinal laser surgery Left 09/20/1986  . SEPTOPLASTY  11/19/98  .  TOTAL KNEE ARTHROPLASTY Left 12/13/2016   Procedure: TOTAL KNEE ARTHROPLASTY WITH RIGHT KNEE CORTISONE INJECTION;  Surgeon: Frederik Pear, MD;  Location: [place];  Service: Orthopedics;  Laterality: Left;  . uric acid 6.9  12/22/04  . x-ray l great toe  07/21/00    SOCIAL HISTORY: Social History   Socioeconomic History  . Marital status: Married    Spouse name: Connor Adams  . Number of children: 2  . Years of education: 53  . Highest education level: Not on file  Occupational History  . Occupation: Systems developer: [employer]  Tobacco Use  . Smoking status:  Never Smoker  . Smokeless tobacco: Never Used  Vaping Use  . Vaping Use: Never used  Substance and Sexual Activity  . Alcohol use: No  . Drug use: No  . Sexual activity: Not on file  Other Topics Concern  . Not on file  Social History Narrative   Married to [employer] at [employer].   Son, [name] married   Son, [name], Married.    Caffeine use: 1 cup coffee per day   Social Determinants of Health   Financial Resource Strain:   . Difficulty of Paying Living Expenses: Not on file  Food Insecurity:   . Worried About Charity fundraiser in the Last Year: Not on file  . Ran Out of Food in the Last Year: Not on file  Transportation Needs:   . Lack of Transportation (Medical): Not on file  . Lack of Transportation (Non-Medical): Not on file  Physical Activity:   . Days of Exercise per Week: Not on file  . Minutes of Exercise per Session: Not on file  Stress:   . Feeling of Stress : Not on file  Social Connections:   . Frequency of Communication with Friends and Family: Not on file  . Frequency of Social Gatherings with Friends and Family: Not on file  . Attends Religious Services: Not on file  . Active Member of Clubs or Organizations: Not on file  . Attends Archivist Meetings: Not on file  . Marital Status: Not on file  Intimate Partner Violence:   . Fear of Current or Ex-Partner: Not on file  . Emotionally Abused: Not on file  . Physically Abused: Not on file  . Sexually Abused: Not on file    FAMILY HISTORY: Family History  Problem Relation Age of Onset  . Liver cancer Sister   . Heart failure Brother 44       CABGx4  . Heart attack Mother 78  . Uterine cancer Mother   . Heart attack Father 44  . Asthma Son   . Diabetes Brother   . Prostate cancer Brother     ALLERGIES:  has No Known Allergies.  MEDICATIONS:  Current Outpatient Medications  Medication Sig Dispense Refill  . acetaminophen (TYLENOL) 325 MG tablet Take 2 tablets  (650 mg total) by mouth every 6 (six) hours as needed for mild pain, moderate pain or fever. (Patient not taking: Reported on 07/07/2020)    . allopurinol (ZYLOPRIM) 300 MG tablet Take 1 tablet (300 mg total) by mouth daily. 30 tablet 1  . diclofenac Sodium (VOLTAREN) 1 % GEL Apply 2 g topically 4 (four) times daily as needed (pain).    . feeding supplement, ENSURE ENLIVE, (ENSURE ENLIVE) LIQD Take 237 mLs by mouth 2 (two) times daily between meals. 237 mL 12  . fluticasone (FLONASE) 50  MCG/ACT nasal spray Place 1-2 sprays into both nostrils daily as needed for allergies or rhinitis.    . hydrocortisone (ANUSOL-HC) 2.5 % rectal cream Place 1 application rectally daily as needed. (Patient taking differently: Place 1 application rectally daily as needed for hemorrhoids. ) 30 g 0  . ibuprofen (ADVIL) 200 MG tablet Take 400 mg by mouth every 6 (six) hours as needed for fever, headache or mild pain.    Marland Kitchen losartan (COZAAR) 25 MG tablet Take 25 mg by mouth daily.     . Multiple Vitamins-Minerals (MULTIVITAMINS THER. W/MINERALS) TABS Take 1 tablet by mouth daily.      . ondansetron (ZOFRAN ODT) 8 MG disintegrating tablet Take 1 tablet (8 mg total) by mouth every 8 (eight) hours as needed for nausea or vomiting. 30 tablet 5  . pantoprazole (PROTONIX) 40 MG tablet Take 1 tablet (40 mg total) by mouth 2 (two) times daily. 60 tablet 5  . polyethylene glycol (MIRALAX / GLYCOLAX) 17 g packet Take 17 g by mouth daily as needed for moderate constipation. 14 each 0  . polyvinyl alcohol (LIQUIFILM TEARS) 1.4 % ophthalmic solution Place 1 drop into both eyes as needed for dry eyes.    Marland Kitchen prochlorperazine (COMPAZINE) 10 MG tablet Take 1 tablet (10 mg total) by mouth every 6 (six) hours as needed for nausea or vomiting. 30 tablet 0  . promethazine (PHENERGAN) 25 MG tablet Take 1 tablet (25 mg total) by mouth every 6 (six) hours as needed for nausea or vomiting. 45 tablet 2  . rosuvastatin (CRESTOR) 5 MG tablet Take 5 mg by  mouth daily.     Marland Kitchen senna-docusate (SENOKOT-S) 8.6-50 MG tablet Take 2 tablets by mouth 2 (two) times daily as needed for mild constipation.    . Sodium Chloride-Sodium Bicarb (SODIUM BICARBONATE/SODIUM CHLORIDE) SOLN 1 application by Mouth Rinse route as needed for dry mouth.    . sucralfate (CARAFATE) 1 g tablet Take 1 tablet (1 g total) by mouth 4 (four) times daily -  with meals and at bedtime. 60 tablet 3   No current facility-administered medications for this visit.    REVIEW OF SYSTEMS:   Constitutional: ( - ) fevers, ( - )  chills , ( - ) night sweats Eyes: ( - ) blurriness of vision, ( - ) double vision, ( - ) watery eyes Ears, nose, mouth, throat, and face: ( - ) mucositis, ( - ) sore throat Respiratory: ( - ) cough, ( - ) dyspnea, ( - ) wheezes Cardiovascular: ( - ) palpitation, ( - ) chest discomfort, ( - ) lower extremity swelling Gastrointestinal:  ( - ) nausea, ( - ) heartburn, ( - ) change in bowel habits Skin: ( - ) abnormal skin rashes Lymphatics: ( - ) new lymphadenopathy, ( - ) easy bruising Neurological: ( - ) numbness, ( - ) tingling, ( - ) new weaknesses Behavioral/Psych: ( - ) mood change, ( - ) new changes  All other systems were reviewed with the patient and are negative.  PHYSICAL EXAMINATION: ECOG PERFORMANCE STATUS: 1 - Symptomatic but completely ambulatory  Vitals:   07/16/20 1632  BP: 102/63  Pulse: 90  Resp: 18  Temp: 99.2 F (37.3 C)  SpO2: 100%   Filed Weights   07/16/20 1632  Weight: 166 lb 3.2 oz (75.4 kg)    GENERAL: well appearing elderly Caucasian male. alert, no distress and comfortable SKIN: skin color, texture, turgor are normal, no rashes or significant lesions. Now  bald, prior erythematous skin lesions clearing up.  EYES: conjunctiva are pink and non-injected, sclera clear LUNGS: clear to auscultation and percussion with normal breathing effort HEART: regular rate & rhythm and no murmurs and no lower extremity edema ABDOMEN: soft,  non-tender, non-distended, normal bowel sounds. Abdominal mass is not palpable.  Musculoskeletal: no cyanosis of digits and no clubbing  PSYCH: alert & oriented x 3, fluent speech NEURO: no focal motor/sensory deficits  LABORATORY DATA:  I have reviewed the data as listed CBC Latest Ref Rng & Units 07/14/2020 07/11/2020 07/10/2020  WBC 4.0 - 10.5 K/uL 5.3 10.2 16.2(H)  Hemoglobin 13.0 - 17.0 g/dL 9.2(L) 8.4(L) 8.4(L)  Hematocrit 39 - 52 % 28.2(L) 26.0(L) 26.8(L)  Platelets 150 - 400 K/uL 299 328 326    CMP Latest Ref Rng & Units 07/14/2020 07/11/2020 07/10/2020  Glucose 70 - 99 mg/dL 120(H) 129(H) 139(H)  BUN 8 - 23 mg/dL 25(H) 32(H) 33(H)  Creatinine 0.61 - 1.24 mg/dL 0.94 1.04 1.18  Sodium 135 - 145 mmol/L 140 140 139  Potassium 3.5 - 5.1 mmol/L 3.9 3.7 3.8  Chloride 98 - 111 mmol/L 105 108 109  CO2 22 - 32 mmol/L 26 25 23   Calcium 8.9 - 10.3 mg/dL 8.8(L) 8.3(L) 8.2(L)  Total Protein 6.5 - 8.1 g/dL 5.5(L) 5.1(L) 5.4(L)  Total Bilirubin 0.3 - 1.2 mg/dL 0.7 0.6 0.7  Alkaline Phos 38 - 126 U/L 63 49 58  AST 15 - 41 U/L 13(L) 15 19  ALT 0 - 44 U/L 18 25 28     RADIOGRAPHIC STUDIES: I have personally reviewed the radiological images as listed and agreed with the findings in the report: large abdominal mass consistent with biopsy proven DLBCL. Bulky tumor.  No results found.  ASSESSMENT & PLAN MARRIO SCRIBNER 74 y.o. male with medical history significant for Stage I DLBCL who presents for a follow up visit.  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  At this time I think he continues to be an excellent candidate for chemotherapy treatment.   On exam today Mr. Kessner notes he feels well overall.  He tolerated the last cycle chemotherapy well without any nausea, vomiting, or diarrhea.   He did have a slight "crash" after his steroids were discontinued on Monday, but otherwise has been quite well.  He is willing and able to proceed with treatment at this time.  The current plan is for 6 cycles of R-EPOCH chemotherapy to be administered in the inpatient setting. He is currently s/p Cycle 3 with a tentative start date of Cycle 4 on 07/28/2020.   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  --findings consistent with a double HIT lymphoma. Treatment of choice is R-EPOCH chemotherapy in the inpatient setting. --he is currently s/p Cycle 3 administered on 07/07/2020 --patient has a port in place. TTE was performed and showed strong baseline heart function. --plan for Cycle 4 of R-EPOCH to start on 07/28/2020. Direct admission to the hospital to be planned for that time.   #Symptom Management --discontinue allopurinol 300mg  PO daily  --prescribed ondansetron 8mg  PO q8H PRN and compazine 10mg  PO q6H prn for nausea --continue Protonix in the setting of high dose steroids --encourage daily BM,  prescribed senna docusate and miralax.  No orders of the defined types were placed in this encounter.  All questions were answered. The patient knows to call the clinic with any problems, questions or concerns.  A total of more than 30 minutes were spent on this encounter and over half of that time was spent on counseling and coordination of care as outlined above.   [name], MD Department of Hematology/Oncology [place] [facility] Phone: [phone] Pager: [phone] Email: [email or web address]  07/17/2020 8:12 AM

## 2020-07-19 ENCOUNTER — Ambulatory Visit
Admission: EM | Admit: 2020-07-19 | Discharge: 2020-07-19 | Disposition: A | Discharge status: Home / Self Care | Payer: Medicare PPO | Attending: Physician Assistant | Admitting: Physician Assistant

## 2020-07-19 ENCOUNTER — Other Ambulatory Visit: Payer: Self-pay

## 2020-07-19 DIAGNOSIS — H0100B Unspecified blepharitis left eye, upper and lower eyelids: Secondary | ICD-10-CM

## 2020-07-19 MED ORDER — ERYTHROMYCIN 5 MG/GM OP OINT: TOPICAL_OINTMENT | OPHTHALMIC | 0 refills | Status: DC

## 2020-07-19 NOTE — Discharge Instructions (Signed)
Lid scrubs and warm compresses as directed. Continue systane gel.  If symptoms not improving, or develop eye redness/pain/discharge, start erythromycin ointment as directed.   Monitor for any worsening of symptoms, changes in vision, sensitivity to light, eye swelling, painful eye movement, follow up with ophthalmology for further evaluation.

## 2020-07-19 NOTE — ED Triage Notes (Signed)
Pt states he has been having a left eye problem that is intermittently occurring for the last 2-3 weeks. Pt is aox4 and ambulatory.

## 2020-07-19 NOTE — ED Provider Notes (Signed)
[facility]    CSN: [id number] Arrival date & time: 07/19/20  1254      History   Chief Complaint Chief Complaint  Patient presents with  . Eye Problem    intermittently for 2-3 weeks    HPI Connor Adams is a 74 y.o. male.   74 year old male comes in for 2-3 week history of left eye irritation, eyelid swelling. States this first started while admitted in the hospital for chemo. Since then symptoms have been intermittent. States left upper eyelid can be itching/irritated at times as well. Noted some eye redness and came in for evaluation. Denies vision changes, eye pain, photophobia. Denies eye drainage/crusting. Denies URI symptoms, fever.      Past Medical History:  Diagnosis Date  . Arthritis   . Chronic kidney disease (CKD), stage III (moderate) (HCC)   . COVID-19   . GERD (gastroesophageal reflux disease)    occ  . Hemorrhoids   . History of ETT 3/05   low risk  . History of hiatal hernia   . History of kidney stones   . HLD (hyperlipidemia)   . HTN (hypertension)   . Left inguinal hernia   . Melanoma ([place])    excied  . 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 Stroke [facility]) 2011   tia   . TIA (transient ischemic attack) 4/11   associated w slurred speecha ndmild facial droop. lasted for about 10 minutes. Had MRI, etc with [facility] that per his report was ok (dr. Stephannie Li). Echo (4/11): EF 55-60%, no regional WMAs, normal diastolic function, mild LAE, no source of embolus    Patient Active Problem List   Diagnosis Date Noted  . 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 Encounter for antineoplastic chemotherapy 06/16/2020  . DLBCL (diffuse large B cell lymphoma) ([place]) 05/24/2020  . Primary localized osteoarthritis of left knee 12/13/2016  . Primary osteoarthritis of left knee 12/09/2016  . HYPERTRIGLYCERIDEMIA 11/17/2006  . HYPERLIPIDEMIA 11/17/2006  . 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 HEARING LOSS  NOS OR DEAFNESS 11/17/2006  . HYPERTENSION, BENIGN SYSTEMIC 11/17/2006  . RHINITIS, ALLERGIC 11/17/2006  . ASTHMA, UNSPECIFIED 11/17/2006  . NEPHROLITHIASIS 11/17/2006  . ACTINIC DERMATITIS 11/17/2006  . APNEA, SLEEP 11/17/2006    Past Surgical History:  Procedure Laterality Date  . BMI 30.2 muscular  12/08/04  . curretage actinic keratosis R ear  01/20/05  . ETT low prob of CAD  11/21/03  . excision melanoma in situ back  01/20/05  . hemorrhoids sclerosed at colonoscopy  12/19/05  . IR IMAGING GUIDED PORT INSERTION  05/19/2020  . LAPAROSCOPIC INGUINAL HERNIA REPAIR Left 12/20/1995  . LUMBAR DISC SURGERY    . NERVE REPAIR     back of neck  . retinal laser surgery Left 09/20/1986  . SEPTOPLASTY  11/19/98  . TOTAL KNEE ARTHROPLASTY Left 12/13/2016   Procedure: TOTAL KNEE ARTHROPLASTY WITH RIGHT KNEE CORTISONE INJECTION;  Surgeon: Frederik Pear, MD;  Location: [facility];  Service: Orthopedics;  Laterality: Left;  . uric acid 6.9  12/22/04  . x-ray l great toe  07/21/00       Home Medications    Prior to Admission medications   Medication Sig Start Date End Date Taking? Authorizing Provider  acetaminophen (TYLENOL) 325 MG tablet Take 2 tablets (650 mg total) by mouth every 6 (six) hours as needed for mild pain, moderate pain or fever. Patient not taking: Reported on 07/07/2020 06/20/20  Maryanna Shape, NP  allopurinol (ZYLOPRIM) 300 MG tablet Take 1 tablet (300 mg total) by mouth daily. 05/30/20   Maryanna Shape, NP  diclofenac Sodium (VOLTAREN) 1 % GEL Apply 2 g topically 4 (four) times daily as needed (pain).    [provider]  erythromycin ophthalmic ointment Place a 1/2 inch ribbon of ointment into the lower eyelid of left eye 4 times a day for 7 days. 07/19/20   Tasia Catchings, Telena Peyser V, PA-C  feeding supplement, ENSURE ENLIVE, (ENSURE ENLIVE) LIQD Take 237 mLs by mouth 2 (two) times daily between meals. 06/20/20   Maryanna Shape, NP  fluticasone (FLONASE) 50 MCG/ACT nasal spray Place 1-2  sprays into both nostrils daily as needed for allergies or rhinitis.    [provider]  hydrocortisone (ANUSOL-HC) 2.5 % rectal cream Place 1 application rectally daily as needed. Patient taking differently: Place 1 application rectally daily as needed for hemorrhoids.  05/30/20   Maryanna Shape, NP  ibuprofen (ADVIL) 200 MG tablet Take 400 mg by mouth every 6 (six) hours as needed for fever, headache or mild pain.    [provider]  losartan (COZAAR) 25 MG tablet Take 25 mg by mouth daily.  04/09/20   [provider]  Multiple Vitamins-Minerals (MULTIVITAMINS THER. W/MINERALS) TABS Take 1 tablet by mouth daily.      [provider]  ondansetron (ZOFRAN ODT) 8 MG disintegrating tablet Take 1 tablet (8 mg total) by mouth every 8 (eight) hours as needed for nausea or vomiting. 05/13/20   Tanner, Lyndon Code., PA-C  pantoprazole (PROTONIX) 40 MG tablet Take 1 tablet (40 mg total) by mouth 2 (two) times daily. 05/13/20   Tanner, Lyndon Code., PA-C  polyethylene glycol (MIRALAX / GLYCOLAX) 17 g packet Take 17 g by mouth daily as needed for moderate constipation. 05/30/20   Maryanna Shape, NP  polyvinyl alcohol (LIQUIFILM TEARS) 1.4 % ophthalmic solution Place 1 drop into both eyes as needed for dry eyes.    [provider]  prochlorperazine (COMPAZINE) 10 MG tablet Take 1 tablet (10 mg total) by mouth every 6 (six) hours as needed for nausea or vomiting. 05/15/20   Orson Slick, MD  promethazine (PHENERGAN) 25 MG tablet Take 1 tablet (25 mg total) by mouth every 6 (six) hours as needed for nausea or vomiting. 05/13/20   Tanner, Lyndon Code., PA-C  rosuvastatin (CRESTOR) 5 MG tablet Take 5 mg by mouth daily.     [provider]  senna-docusate (SENOKOT-S) 8.6-50 MG tablet Take 2 tablets by mouth 2 (two) times daily as needed for mild constipation. 05/30/20   Maryanna Shape, NP  Sodium Chloride-Sodium Bicarb (SODIUM BICARBONATE/SODIUM CHLORIDE) SOLN 1 application by  Mouth Rinse route as needed for dry mouth. 05/30/20   Maryanna Shape, NP  sucralfate (CARAFATE) 1 g tablet Take 1 tablet (1 g total) by mouth 4 (four) times daily -  with meals and at bedtime. 05/20/20   Mauri Pole, MD    Family History Family History  Problem Relation Age of Onset  . Liver cancer Sister   . Heart failure Brother 44       CABGx4  . Heart attack Mother 78  . Uterine cancer Mother   . Heart attack Father 79  . Asthma Son   . Diabetes Brother   . Prostate cancer Brother     Social History Social History   Tobacco Use  . Smoking status: Never Smoker  .  Smokeless tobacco: Never Used  Vaping Use  . Vaping Use: Never used  Substance Use Topics  . Alcohol use: No  . Drug use: No     Allergies   Patient has no known allergies.   Review of Systems Review of Systems  Reason unable to perform ROS: See HPI as above.     Physical Exam Triage Vital Signs ED Triage Vitals  Enc Vitals Group     BP 07/19/20 1312 (!) 152/68     Pulse Rate 07/19/20 1312 90     Resp 07/19/20 1312 16     Temp 07/19/20 1312 98.3 F (36.8 C)     Temp Source 07/19/20 1312 Oral     SpO2 07/19/20 1312 96 %     Weight --      Height --      Head Circumference --      Peak Flow --      Pain Score 07/19/20 1321 0     Pain Loc --      Pain Edu? --      Excl. in [place]? --    No data found.  Updated Vital Signs BP (!) 152/68 (BP Location: Left Arm)   Pulse 90   Temp 98.3 F (36.8 C) (Oral)   Resp 16   SpO2 96%   Physical Exam Constitutional:      General: He is not in acute distress.    Appearance: He is well-developed.  HENT:     Head: Normocephalic and atraumatic.  Eyes:     General: Lids are normal.     Extraocular Movements: Extraocular movements intact.     Pupils: Pupils are equal, round, and reactive to light.     Comments: Minimal conjunctival injection to left eye. No ciliary injection. No photophobia on exam.   Musculoskeletal:     Cervical back:  Normal range of motion and neck supple.  Skin:    General: Skin is warm and dry.  Neurological:     Mental Status: He is alert and oriented to person, place, and time.      UC Treatments / Results  Labs (all labs ordered are listed, but only abnormal results are displayed) Labs Reviewed - No data to display  EKG   Radiology No results found.  Procedures Procedures (including critical care time)  Medications Ordered in UC Medications - No data to display  Initial Impression / Assessment and Plan / UC Course  I have reviewed the triage vital signs and the nursing notes.  Pertinent labs & imaging results that were available during my care of the patient were reviewed by me and considered in my medical decision making (see chart for details).    History and exam more consistent with blepharitis/dry eyes. Lids scrubs, warm compress, artificial tears. If develop increased eye redness, drainage/crusting, eye pain, can start erythromycin ointment to cover for conjunctivitis. Return precautions given. Otherwise follow up with ophthalmology if needed.  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 Provider   erythromycin ophthalmic ointment Place a 1/2 inch ribbon of ointment into the lower eyelid of left eye 4 times a day for 7 days. 1 g Ok Edwards, PA-C     PDMP not reviewed this encounter.   Ok Edwards, PA-C 07/19/20 1343

## 2020-07-22 ENCOUNTER — Telehealth: Payer: Self-pay | Admitting: Hematology and Oncology

## 2020-07-22 NOTE — Telephone Encounter (Signed)
Scheduled per los. Called and spoke with patient. Confirmed appt 

## 2020-07-25 ENCOUNTER — Other Ambulatory Visit: Payer: Self-pay | Admitting: Hematology and Oncology

## 2020-07-25 ENCOUNTER — Other Ambulatory Visit: Payer: Self-pay | Admitting: *Deleted

## 2020-07-25 MED ORDER — PREDNISONE 20 MG PO TABS: 120.0000 mg | ORAL_TABLET | Freq: Every day | ORAL | 0 refills | Status: DC

## 2020-07-26 ENCOUNTER — Other Ambulatory Visit (HOSPITAL_COMMUNITY)
Admission: RE | Admit: 2020-07-26 | Discharge: 2020-07-26 | Disposition: A | Discharge status: Home / Self Care | Payer: Medicare PPO | Source: Ambulatory Visit | Attending: Hematology and Oncology | Admitting: Hematology and Oncology

## 2020-07-26 DIAGNOSIS — Z85820 Personal history of malignant melanoma of skin: Secondary | ICD-10-CM | POA: Diagnosis not present

## 2020-07-26 DIAGNOSIS — C8333 Diffuse large B-cell lymphoma, intra-abdominal lymph nodes: Secondary | ICD-10-CM | POA: Diagnosis present

## 2020-07-26 DIAGNOSIS — Z96652 Presence of left artificial knee joint: Secondary | ICD-10-CM | POA: Diagnosis present

## 2020-07-26 DIAGNOSIS — K219 Gastro-esophageal reflux disease without esophagitis: Secondary | ICD-10-CM | POA: Diagnosis present

## 2020-07-26 DIAGNOSIS — C8338 Diffuse large B-cell lymphoma, lymph nodes of multiple sites: Secondary | ICD-10-CM | POA: Diagnosis not present

## 2020-07-26 DIAGNOSIS — Z01812 Encounter for preprocedural laboratory examination: Secondary | ICD-10-CM | POA: Insufficient documentation

## 2020-07-26 DIAGNOSIS — Z20822 Contact with and (suspected) exposure to covid-19: Secondary | ICD-10-CM | POA: Diagnosis present

## 2020-07-26 DIAGNOSIS — I129 Hypertensive chronic kidney disease with stage 1 through stage 4 chronic kidney disease, or unspecified chronic kidney disease: Secondary | ICD-10-CM | POA: Diagnosis present

## 2020-07-26 DIAGNOSIS — Z79899 Other long term (current) drug therapy: Secondary | ICD-10-CM | POA: Diagnosis not present

## 2020-07-26 DIAGNOSIS — Z8673 Personal history of transient ischemic attack (TIA), and cerebral infarction without residual deficits: Secondary | ICD-10-CM | POA: Diagnosis not present

## 2020-07-26 DIAGNOSIS — E785 Hyperlipidemia, unspecified: Secondary | ICD-10-CM | POA: Diagnosis present

## 2020-07-26 DIAGNOSIS — Z5111 Encounter for antineoplastic chemotherapy: Secondary | ICD-10-CM | POA: Diagnosis not present

## 2020-07-26 DIAGNOSIS — N183 Chronic kidney disease, stage 3 unspecified: Secondary | ICD-10-CM | POA: Diagnosis present

## 2020-07-26 LAB — SARS CORONAVIRUS 2 (TAT 6-24 HRS): SARS Coronavirus 2: NEGATIVE

## 2020-07-28 ENCOUNTER — Encounter (HOSPITAL_COMMUNITY): Payer: Self-pay | Admitting: Hematology and Oncology

## 2020-07-28 ENCOUNTER — Inpatient Hospital Stay (HOSPITAL_COMMUNITY)
Admission: AD | Admit: 2020-07-28 | Discharge: 2020-08-01 | DRG: 847 | Disposition: A | Discharge status: Home / Self Care | Payer: Medicare PPO | Source: Ambulatory Visit | Attending: Hematology and Oncology | Admitting: Hematology and Oncology

## 2020-07-28 ENCOUNTER — Other Ambulatory Visit: Payer: Self-pay | Admitting: Hematology and Oncology

## 2020-07-28 DIAGNOSIS — C8330 Diffuse large B-cell lymphoma, unspecified site: Secondary | ICD-10-CM

## 2020-07-28 DIAGNOSIS — Z5111 Encounter for antineoplastic chemotherapy: Secondary | ICD-10-CM | POA: Diagnosis not present

## 2020-07-28 DIAGNOSIS — Z8673 Personal history of transient ischemic attack (TIA), and cerebral infarction without residual deficits: Secondary | ICD-10-CM

## 2020-07-28 DIAGNOSIS — M1712 Unilateral primary osteoarthritis, left knee: Secondary | ICD-10-CM

## 2020-07-28 DIAGNOSIS — C8333 Diffuse large B-cell lymphoma, intra-abdominal lymph nodes: Secondary | ICD-10-CM

## 2020-07-28 DIAGNOSIS — Z85820 Personal history of malignant melanoma of skin: Secondary | ICD-10-CM | POA: Diagnosis not present

## 2020-07-28 DIAGNOSIS — Z96652 Presence of left artificial knee joint: Secondary | ICD-10-CM | POA: Diagnosis present

## 2020-07-28 DIAGNOSIS — Z79899 Other long term (current) drug therapy: Secondary | ICD-10-CM | POA: Diagnosis not present

## 2020-07-28 DIAGNOSIS — K219 Gastro-esophageal reflux disease without esophagitis: Secondary | ICD-10-CM | POA: Diagnosis present

## 2020-07-28 DIAGNOSIS — I129 Hypertensive chronic kidney disease with stage 1 through stage 4 chronic kidney disease, or unspecified chronic kidney disease: Secondary | ICD-10-CM | POA: Diagnosis present

## 2020-07-28 DIAGNOSIS — C8338 Diffuse large B-cell lymphoma, lymph nodes of multiple sites: Secondary | ICD-10-CM | POA: Diagnosis not present

## 2020-07-28 DIAGNOSIS — E785 Hyperlipidemia, unspecified: Secondary | ICD-10-CM | POA: Diagnosis present

## 2020-07-28 DIAGNOSIS — Z20822 Contact with and (suspected) exposure to covid-19: Secondary | ICD-10-CM | POA: Diagnosis present

## 2020-07-28 DIAGNOSIS — N183 Chronic kidney disease, stage 3 unspecified: Secondary | ICD-10-CM | POA: Diagnosis present

## 2020-07-28 LAB — CBC WITH DIFFERENTIAL/PLATELET
Abs Immature Granulocytes: 0.17 10*3/uL — ABNORMAL HIGH (ref 0.00–0.07)
Basophils Absolute: 0.1 10*3/uL (ref 0.0–0.1)
Basophils Relative: 1 %
Eosinophils Absolute: 0 10*3/uL (ref 0.0–0.5)
Eosinophils Relative: 0 %
HCT: 30.2 % — ABNORMAL LOW (ref 39.0–52.0)
Hemoglobin: 9.3 g/dL — ABNORMAL LOW (ref 13.0–17.0)
Immature Granulocytes: 2 %
Lymphocytes Relative: 2 %
Lymphs Abs: 0.3 10*3/uL — ABNORMAL LOW (ref 0.7–4.0)
MCH: 28.3 pg (ref 26.0–34.0)
MCHC: 30.8 g/dL (ref 30.0–36.0)
MCV: 91.8 fL (ref 80.0–100.0)
Monocytes Absolute: 0.6 10*3/uL (ref 0.1–1.0)
Monocytes Relative: 5 %
Neutro Abs: 10.1 10*3/uL — ABNORMAL HIGH (ref 1.7–7.7)
Neutrophils Relative %: 90 %
Platelets: 330 10*3/uL (ref 150–400)
RBC: 3.29 MIL/uL — ABNORMAL LOW (ref 4.22–5.81)
RDW: 20.6 % — ABNORMAL HIGH (ref 11.5–15.5)
WBC: 11.2 10*3/uL — ABNORMAL HIGH (ref 4.0–10.5)
nRBC: 0 % (ref 0.0–0.2)

## 2020-07-28 LAB — LACTATE DEHYDROGENASE: LDH: 167 U/L (ref 98–192)

## 2020-07-28 LAB — COMPREHENSIVE METABOLIC PANEL
ALT: 26 U/L (ref 0–44)
AST: 24 U/L (ref 15–41)
Albumin: 3.6 g/dL (ref 3.5–5.0)
Alkaline Phosphatase: 63 U/L (ref 38–126)
Anion gap: 8 (ref 5–15)
BUN: 26 mg/dL — ABNORMAL HIGH (ref 8–23)
CO2: 25 mmol/L (ref 22–32)
Calcium: 8.8 mg/dL — ABNORMAL LOW (ref 8.9–10.3)
Chloride: 106 mmol/L (ref 98–111)
Creatinine, Ser: 1.09 mg/dL (ref 0.61–1.24)
GFR, Estimated: >60 mL/min (ref >60)
Glucose, Bld: 131 mg/dL — ABNORMAL HIGH (ref 70–99)
Potassium: 3.9 mmol/L (ref 3.5–5.1)
Sodium: 139 mmol/L (ref 135–145)
Total Bilirubin: 0.4 mg/dL (ref 0.3–1.2)
Total Protein: 6.4 g/dL — ABNORMAL LOW (ref 6.5–8.1)

## 2020-07-28 LAB — URIC ACID: Uric Acid, Serum: 6.5 mg/dL (ref 3.7–8.6)

## 2020-07-28 MED ORDER — FLUTICASONE PROPIONATE 50 MCG/ACT NA SUSP
1.0000 | Freq: Every day | NASAL | Status: DC | PRN
  Filled 2020-07-28: qty 16

## 2020-07-28 MED ORDER — PREDNISONE 50 MG PO TABS: 120.0000 mg | ORAL_TABLET | Freq: Every day | ORAL | Status: DC

## 2020-07-28 MED ORDER — PREDNISONE 50 MG PO TABS
120.0000 mg | ORAL_TABLET | Freq: Every day | ORAL | Status: AC
  Administered 2020-07-29 – 2020-08-01 (×4): 120 mg via ORAL
  Filled 2020-07-28 (×4): qty 1

## 2020-07-28 MED ORDER — ALLOPURINOL 300 MG PO TABS
300.0000 mg | ORAL_TABLET | Freq: Every day | ORAL | Status: DC
  Administered 2020-07-29 – 2020-08-01 (×4): 300 mg via ORAL
  Filled 2020-07-28 (×5): qty 1

## 2020-07-28 MED ORDER — HYDROCORTISONE 2.5 % RE CREA
1.0000 "application " | TOPICAL_CREAM | Freq: Every day | RECTAL | Status: DC | PRN
  Filled 2020-07-28: qty 28.35

## 2020-07-28 MED ORDER — ROSUVASTATIN CALCIUM 5 MG PO TABS
5.0000 mg | ORAL_TABLET | Freq: Every day | ORAL | Status: DC
  Administered 2020-07-29 – 2020-08-01 (×4): 5 mg via ORAL
  Filled 2020-07-28 (×5): qty 1

## 2020-07-28 MED ORDER — PROMETHAZINE HCL 25 MG PO TABS
25.0000 mg | ORAL_TABLET | Freq: Four times a day (QID) | ORAL | Status: DC | PRN
  Filled 2020-07-28: qty 1

## 2020-07-28 MED ORDER — PREDNISONE 50 MG PO TABS: 120.0000 mg | ORAL_TABLET | Freq: Every day | ORAL | Status: AC

## 2020-07-28 MED ORDER — ENSURE ENLIVE PO LIQD
237.0000 mL | Freq: Two times a day (BID) | ORAL | Status: DC
  Administered 2020-07-28 – 2020-08-01 (×8): 237 mL via ORAL

## 2020-07-28 MED ORDER — SODIUM BICARBONATE/SODIUM CHLORIDE MOUTHWASH
1.0000 "application " | OROMUCOSAL | Status: DC | PRN
  Filled 2020-07-28 (×2): qty 1000

## 2020-07-28 MED ORDER — IBUPROFEN 200 MG PO TABS: 400.0000 mg | ORAL_TABLET | Freq: Four times a day (QID) | ORAL | Status: DC | PRN

## 2020-07-28 MED ORDER — ADULT MULTIVITAMIN W/MINERALS CH
1.0000 | ORAL_TABLET | Freq: Every day | ORAL | Status: DC
  Administered 2020-07-29 – 2020-08-01 (×4): 1 via ORAL
  Filled 2020-07-28 (×5): qty 1

## 2020-07-28 MED ORDER — VINCRISTINE SULFATE CHEMO INJECTION 1 MG/ML
Freq: Once | INTRAVENOUS | Status: AC
  Filled 2020-07-28: qty 12

## 2020-07-28 MED ORDER — ACETAMINOPHEN 325 MG PO TABS: 650.0000 mg | ORAL_TABLET | Freq: Four times a day (QID) | ORAL | Status: DC | PRN

## 2020-07-28 MED ORDER — ONDANSETRON 4 MG PO TBDP: 8.0000 mg | ORAL_TABLET | Freq: Three times a day (TID) | ORAL | Status: DC | PRN

## 2020-07-28 MED ORDER — SODIUM CHLORIDE 0.9% FLUSH
10.0000 mL | Freq: Two times a day (BID) | INTRAVENOUS | Status: DC
  Administered 2020-07-28 (×2): 10 mL
  Administered 2020-07-29: 20 mL
  Administered 2020-07-29 – 2020-08-01 (×4): 10 mL

## 2020-07-28 MED ORDER — SODIUM CHLORIDE 0.9 % IV SOLN
Freq: Once | INTRAVENOUS | Status: AC
  Administered 2020-07-28: 18 mg via INTRAVENOUS
  Filled 2020-07-28: qty 4

## 2020-07-28 MED ORDER — LOSARTAN POTASSIUM 50 MG PO TABS
25.0000 mg | ORAL_TABLET | Freq: Every day | ORAL | Status: DC
  Administered 2020-07-29 – 2020-08-01 (×4): 25 mg via ORAL
  Filled 2020-07-28 (×5): qty 1

## 2020-07-28 MED ORDER — POLYETHYLENE GLYCOL 3350 17 G PO PACK
17.0000 g | PACK | Freq: Every day | ORAL | Status: DC
  Administered 2020-07-28 – 2020-08-01 (×5): 17 g via ORAL
  Filled 2020-07-28 (×5): qty 1

## 2020-07-28 MED ORDER — SODIUM CHLORIDE 0.9% FLUSH: 10.0000 mL | INTRAVENOUS | Status: DC | PRN

## 2020-07-28 MED ORDER — PROCHLORPERAZINE MALEATE 10 MG PO TABS: 10.0000 mg | ORAL_TABLET | Freq: Four times a day (QID) | ORAL | Status: DC | PRN

## 2020-07-28 MED ORDER — SUCRALFATE 1 G PO TABS
1.0000 g | ORAL_TABLET | Freq: Three times a day (TID) | ORAL | Status: DC
  Administered 2020-07-28 – 2020-08-01 (×16): 1 g via ORAL
  Filled 2020-07-28 (×16): qty 1

## 2020-07-28 MED ORDER — ENOXAPARIN SODIUM 40 MG/0.4ML ~~LOC~~ SOLN
40.0000 mg | Freq: Every day | SUBCUTANEOUS | Status: DC
  Administered 2020-07-28 – 2020-08-01 (×5): 40 mg via SUBCUTANEOUS
  Filled 2020-07-28 (×6): qty 0.4

## 2020-07-28 MED ORDER — PANTOPRAZOLE SODIUM 40 MG PO TBEC
40.0000 mg | DELAYED_RELEASE_TABLET | Freq: Two times a day (BID) | ORAL | Status: DC
  Administered 2020-07-28 – 2020-08-01 (×8): 40 mg via ORAL
  Filled 2020-07-28 (×9): qty 1

## 2020-07-28 MED ORDER — POLYVINYL ALCOHOL 1.4 % OP SOLN
1.0000 [drp] | OPHTHALMIC | Status: DC | PRN
  Filled 2020-07-28: qty 15

## 2020-07-28 MED ORDER — CHLORHEXIDINE GLUCONATE CLOTH 2 % EX PADS
6.0000 | MEDICATED_PAD | Freq: Every day | CUTANEOUS | Status: DC
  Administered 2020-07-28 – 2020-08-01 (×5): 6 via TOPICAL

## 2020-07-28 MED ORDER — SENNOSIDES-DOCUSATE SODIUM 8.6-50 MG PO TABS
2.0000 | ORAL_TABLET | Freq: Two times a day (BID) | ORAL | Status: DC
  Administered 2020-07-28 – 2020-08-01 (×8): 2 via ORAL
  Filled 2020-07-28 (×9): qty 2

## 2020-07-28 NOTE — H&P (Signed)
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   1) 04/17/2020:  CT A/P showed dominant nodal mass (11.5 x 10.9 cm) within the jejunal mesentery, highly suspicious for lymphoma. Additionally there are left lower lobe pleural-based pulmonary nodules 2) 04/24/2020: establish care with Dr. Lorenso Courier 3) 04/28/2020:  PET CT scan shows Large solid left mesenteric mass 13.0 cm with maximum SUV of 21.2, Deauville 5 with local hypermetabolic porta hepatis and retroperitoneal lymph nodes. Constitutes bulky Stage I disease.   Reason for Admission: double hit lymphoma, Cycle #4 EPOCH-R  HPI: Connor Adams 74 y.o. 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   On review of the previous records Connor Adams underwent a CT abdomen pelvis on 04/17/2020 at which time a nodal mass 11.5 x 10.9 cm was noted within the jejunal mesentery.  There were also left lower lobe pleural-based pulmonary nodules noted at that time as well as soft tissue fullness at the ileocecal valve.  Due to concern for this finding the patient was referred to oncology for further evaluation and management.  A PET scan was performed on 04/28/2020 showed a large solid left mesenteric mass 13.0 cm with maximum SUV of 21.2, Deauville 5, hypermetabolic porta hepatis and retroperitoneal lymph nodes along with small but hypermetabolic juxtadiaphragmatic/pericardial lymph nodes in the lower chest, generally Deauville 4 and Deauville 5. A biopsy of the mesenteric mass was performed on 05/08/2020 which was consistent with Large B-cell lymphoma. 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 It was recommend for him to begin chemotherapy with EPOCH-R.   In anticipation of chemotherapy, the patient had a PAC placed on 05/19/2020 and an echocardiogram completed on 05/22/2020 which showed an LVEF of 60-65%.  The patient was seen for admission today.  He continues to feel well overall.  He still reports some mild discomfort in his left ribs from a fall that he has several weeks ago.  The pain is controlled with Tylenol.  Appetite has been good and he has not had any recurrent nausea or vomiting.  Denies mucositis.  Bowels moved well earlier today. He denies fevers and chills.  Denies chest pain, shortness of breath, cough.  Denies headaches and dizziness.  Denies bleeding.  The patient is seen today for admission to the hospital for cycle #4 EPOCH-R.    Diagnosis Date  . Arthritis   . Chronic kidney disease (CKD), stage III (moderate) (HCC)   . COVID-19   . GERD (gastroesophageal reflux disease)    occ  . Hemorrhoids   . History of ETT 3/05   low risk  . History of hiatal hernia   . History of kidney stones   . HLD (hyperlipidemia)   . HTN (hypertension)   . Left inguinal hernia   . Melanoma ([place])    excied  . 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 Stroke [facility]) 2011   tia   . TIA (transient ischemic attack) 4/11   associated w slurred speecha ndmild facial droop. lasted for about 10 minutes. Had MRI, etc with [facility] that per his report was ok (dr.  Penumallli). Echo (4/11): EF 55-60%, no regional WMAs, normal diastolic function, mild LAE, no source of embolus  Past Surgical History:  Procedure Laterality Date  . BMI 30.2 muscular  12/08/04  . curretage actinic keratosis R ear  01/20/05  . ETT low prob of CAD  11/21/03  . excision melanoma in situ back  01/20/05  . hemorrhoids sclerosed at colonoscopy  12/19/05  . IR IMAGING GUIDED PORT INSERTION  05/19/2020  . LAPAROSCOPIC INGUINAL HERNIA REPAIR Left 12/20/1995  . LUMBAR  DISC SURGERY    . NERVE REPAIR     back of neck  . retinal laser surgery Left 09/20/1986  . SEPTOPLASTY  11/19/98  . TOTAL KNEE ARTHROPLASTY Left 12/13/2016   Procedure: TOTAL KNEE ARTHROPLASTY WITH RIGHT KNEE CORTISONE INJECTION;  Surgeon: Frederik Pear, MD;  Location: [facility];  Service: Orthopedics;  Laterality: Left;  . uric acid 6.9  12/22/04  . x-ray l great toe  07/21/00       [facility] Past Surgical History:  Procedure Laterality Date  . BMI 30.2 muscular  12/08/04  . curretage actinic keratosis R ear  01/20/05  . ETT low prob of CAD  11/21/03  . excision melanoma in situ back  01/20/05  . hemorrhoids sclerosed at colonoscopy  12/19/05  . IR IMAGING GUIDED PORT INSERTION  05/19/2020  . LAPAROSCOPIC INGUINAL HERNIA REPAIR Left 12/20/1995  . LUMBAR DISC SURGERY    . NERVE REPAIR     back of neck  . retinal laser surgery Left 09/20/1986  . SEPTOPLASTY  11/19/98  . TOTAL KNEE ARTHROPLASTY Left 12/13/2016   Procedure: TOTAL KNEE ARTHROPLASTY WITH RIGHT KNEE CORTISONE INJECTION;  Surgeon: Frederik Pear, MD;  Location: [facility];  Service: Orthopedics;  Laterality: Left;  . uric acid 6.9  12/22/04  . x-ray l great toe  07/21/00  quency Provider Last Rate Last Admin  . acetaminophen (TYLENOL) tablet 650 mg  650 mg Oral Q6H PRN Ren Aspinall R, NP      . allopurinol (ZYLOPRIM) tablet 300 mg  300 mg Oral Daily Jaquarious Grey, Roselie Awkward, NP      . Chlorhexidine Gluconate Cloth 2 % PADS 6 each  6 each Topical Daily Ryeleigh Santore R, NP      . enoxaparin (LOVENOX) injection 40 mg  40 mg Subcutaneous Q24H Cory Kitt R, NP      . feeding supplement (ENSURE ENLIVE / ENSURE PLUS) liquid 237 mL  237 mL Oral BID BM Shlomie Romig, Roselie Awkward, NP      . hydrocortisone (ANUSOL-HC) 2.5 % rectal cream 1 application  1 application Rectal Daily PRN Tamiko Leopard, Roselie Awkward, NP      . losartan (COZAAR) tablet 25 mg  25 mg Oral Daily Keshonda Monsour R, NP      . multivitamins ther. w/minerals tablet 1 tablet  1 tablet Oral Daily Ngoc Detjen R, NP       . ondansetron (ZOFRAN-ODT) disintegrating tablet 8 mg  8 mg Oral Q8H PRN Dalaysia Harms R, NP      . pantoprazole (PROTONIX) EC tablet 40 mg  40 mg Oral BID Dameer Speiser R, NP      . polyethylene glycol (MIRALAX / GLYCOLAX) packet 17 g  17 g Oral Daily Zackariah Vanderpol R, NP      . prochlorperazine (COMPAZINE) tablet 10 mg  10 mg Oral Q6H PRN Spyridon Hornstein R, NP      . rosuvastatin (CRESTOR) tablet 5 mg  5 mg Oral Daily Deyjah Kindel, Roselie Awkward, NP      .  senna-docusate (Senokot-S) tablet 2 tablet  2 tablet Oral BID Jameson Morrow R, NP      . sodium bicarbonate/sodium chloride mouthwash [id number]  1 application Mouth Rinse PRN Quatisha Zylka R, NP      . sodium chloride flush (NS) 0.9 % injection 10-40 mL  10-40 mL Intracatheter Q12H Jamee Pacholski R, NP      . sodium chloride flush (NS) 0.9 % injection 10-40 mL  10-40 mL Intracatheter PRN Alisse Tuite R, NP      . sucralfate (CARAFATE) 1 GM/10ML suspension 1 g  1 g Oral TID WC & HS Patterson Hollenbaugh, Roselie Awkward, NP           Family History  Problem Relation Age of Onset  . Liver cancer Sister   . Heart failure Brother 44       CABGx4  . Heart attack Mother 67  . Uterine cancer Mother   . Heart attack Father 14  . Asthma Son   . Diabetes Brother   . Prostate cancer Brother   Relation Age of Onset  . Liver cancer Sister   . Heart failure Brother 44       CABGx4  . Heart attack Mother 58  . Uterine cancer Mother   . Heart attack Father 24  . Asthma Son   . Diabetes Brother   . Prostate cancer Brother      Socioeconomic History  . Marital status: Married    Spouse name: [name]  . Number of children: 2  . Years of education: 27  . Highest education level: Not on file  Occupational History  . Occupation: Systems developer: [employer]  Tobacco Use  . Smoking status: Never Smoker  . Smokeless tobacco: Never Used  Vaping Use  . Vaping Use: Never used  Substance and Sexual Activity  . Alcohol use: No  . Drug use:  No  . Sexual activity: Not on file  Other Topics Concern  . Not on file  Social History Narrative   Married to [employer] at [employer].   Son, [name] married   Son, [name], Married.    Caffeine use: 1 cup coffee per day   Social Determinants of Health   Financial Resource Strain:   . Difficulty of Paying Living Expenses: Not on file  Food Insecurity:   . Worried About Charity fundraiser in the Last Year: Not on file  . Ran Out of Food in the Last Year: Not on file  Transportation Needs:   . Lack of Transportation (Medical): Not on file  . Lack of Transportation (Non-Medical): Not on file  Physical Activity:   . Days of Exercise per Week: Not on file  . Minutes of Exercise per Session: Not on file  Stress:   . Feeling of Stress : Not on file  Social Connections:   . Frequency of Communication with Friends and Family: Not on file  . Frequency of Social Gatherings with Friends and Family: Not on file  . Attends Religious Services: Not on file  . Active Member of Clubs or Organizations: Not on file  . Attends Archivist Meetings: Not on file  . Marital Status: Not on file  Intimate Partner Violence:   . Fear of Current or Ex-Partner: Not on file  . Emotionally Abused: Not on file  . Physically Abused: Not on file  . Sexually Abused: Not on file  :  Review of  Systems: A comprehensive 14 point review of systems was negative except as noted in the HPI.  Exam: Patient Vitals for the past 24 hrs:  BP Temp Temp src Pulse Resp SpO2  07/07/20 1050 (!) 144/64 98 F (36.7 C) Oral (!) 57 13 100 %    General:  well-nourished in no acute distress.   Eyes:  no scleral icterus.   ENT:  There were no oropharyngeal lesions.   Neck was without thyromegaly.   Lymphatics:  Negative cervical, supraclavicular or axillary adenopathy.   Respiratory: lungs were clear bilaterally without wheezing or crackles.   Cardiovascular:  Regular rate and rhythm, S1/S2,  without murmur, rub or gallop.  There was no pedal edema.   GI:  Positive bowel sounds.  I was unable to palpate the mass in the left upper quadrant today.  Musculoskeletal:  no spinal tenderness of palpation of vertebral spine.   Skin exam was without echymosis, petichae.   Neuro exam was nonfocal. Patient was alert and oriented.  Attention was good.   Language was appropriate.  Mood was normal without depression.  Speech was not pressured.  Thought content was not tangential.     Lab Results  Component Value Date   WBC 6.0 06/23/2020   HGB 9.6 (L) 06/23/2020   HCT 28.8 (L) 06/23/2020   PLT 325 06/23/2020   GLUCOSE 120 (H) 06/23/2020   CHOL  12/31/2009    160        ATP III CLASSIFICATION:  <200     mg/dL   Desirable  200-239  mg/dL   Borderline High  >=240    mg/dL   High          TRIG 213 (H) 12/31/2009   HDL 28 (L) 12/31/2009   LDLCALC  12/31/2009    89        Total Cholesterol/HDL:CHD Risk Coronary Heart Disease Risk Table                     Men   Women  1/2 Average Risk   3.4   3.3  Average Risk       5.0   4.4  2 X Average Risk   9.6   7.1  3 X Average Risk  23.4   11.0        Use the calculated Patient Ratio above and the CHD Risk Table to determine the patient's CHD Risk.        ATP III CLASSIFICATION (LDL):  <100     mg/dL   Optimal  100-129  mg/dL   Near or Above                    Optimal  130-159  mg/dL   Borderline  160-189  mg/dL   High  >190     mg/dL   Very High   ALT 40 06/23/2020   AST 16 06/23/2020   NA 140 06/23/2020   K 3.6 06/23/2020   CL 104 06/23/2020   CREATININE 0.99 06/23/2020   BUN 25 (H) 06/23/2020   CO2 28 06/23/2020    No results found.   No results found.  Assessment and Plan:  Connor Adams 74 y.o. male with medical history significant for Stage I DLBCL who presents for a follow up visit.  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 Recommend for him to proceed with EPOCH-R as an inpatient.  He is here to begin cycle #4 today.   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 Stage I bulky disease.   --Recommend EPOCH-R -begin cycle #4 on 07/28/2020. --Adverse effects have been reviewed including, but not limited to, alopecia, myelosuppression, peripheral neuropathy, mucositis, liver/renal dysfunction, and possible infusion reaction associated with Rituxan. He agrees to proceed. --PAC in place and ready for use. --Echocardiogram from 05/22/2020 shows LVEF of 60-65%  --CBC with differential, CMET, LDH, and uric acid are pending today.  Will review prior to initiating chemotherapy. --Will monitor closely with daily CBC with diff, CMET, LDH, and uric acid --Continue allopurinol daily.  May consider DC of allopurinol pending uric acid result from today. --Lovenox for DVT prophylaxis. --Continue home medications for HTN and hyperlipidemia.  --Continue PRN antiemetics. --He has scheduled MiraLAX and Senokot-S 2 tablets twice a day.   The patient is scheduled for Rituxan and Neulasta onMonday, 08/04/2020.  Follow-up visit as scheduled on 08/07/2020.  [name], DNP, AGPCNP-BC, AOCNP

## 2020-07-29 ENCOUNTER — Other Ambulatory Visit: Payer: Self-pay

## 2020-07-29 LAB — CBC WITH DIFFERENTIAL/PLATELET
Abs Immature Granulocytes: 0.34 10*3/uL — ABNORMAL HIGH (ref 0.00–0.07)
Basophils Absolute: 0 10*3/uL (ref 0.0–0.1)
Basophils Relative: 0 %
Eosinophils Absolute: 0 10*3/uL (ref 0.0–0.5)
Eosinophils Relative: 0 %
HCT: 27.8 % — ABNORMAL LOW (ref 39.0–52.0)
Hemoglobin: 8.6 g/dL — ABNORMAL LOW (ref 13.0–17.0)
Immature Granulocytes: 3 %
Lymphocytes Relative: 3 %
Lymphs Abs: 0.4 10*3/uL — ABNORMAL LOW (ref 0.7–4.0)
MCH: 28.6 pg (ref 26.0–34.0)
MCHC: 30.9 g/dL (ref 30.0–36.0)
MCV: 92.4 fL (ref 80.0–100.0)
Monocytes Absolute: 0.7 10*3/uL (ref 0.1–1.0)
Monocytes Relative: 5 %
Neutro Abs: 11.1 10*3/uL — ABNORMAL HIGH (ref 1.7–7.7)
Neutrophils Relative %: 89 %
Platelets: 360 10*3/uL (ref 150–400)
RBC: 3.01 MIL/uL — ABNORMAL LOW (ref 4.22–5.81)
RDW: 20.6 % — ABNORMAL HIGH (ref 11.5–15.5)
WBC: 12.5 10*3/uL — ABNORMAL HIGH (ref 4.0–10.5)
nRBC: 0 % (ref 0.0–0.2)

## 2020-07-29 LAB — COMPREHENSIVE METABOLIC PANEL WITH GFR
ALT: 24 U/L (ref 0–44)
AST: 19 U/L (ref 15–41)
Albumin: 3.3 g/dL — ABNORMAL LOW (ref 3.5–5.0)
Alkaline Phosphatase: 58 U/L (ref 38–126)
Anion gap: 7 (ref 5–15)
BUN: 26 mg/dL — ABNORMAL HIGH (ref 8–23)
CO2: 24 mmol/L (ref 22–32)
Calcium: 9.1 mg/dL (ref 8.9–10.3)
Chloride: 108 mmol/L (ref 98–111)
Creatinine, Ser: 1.04 mg/dL (ref 0.61–1.24)
GFR, Estimated: >60 mL/min
Glucose, Bld: 130 mg/dL — ABNORMAL HIGH (ref 70–99)
Potassium: 4.3 mmol/L (ref 3.5–5.1)
Sodium: 139 mmol/L (ref 135–145)
Total Bilirubin: 0.3 mg/dL (ref 0.3–1.2)
Total Protein: 6.1 g/dL — ABNORMAL LOW (ref 6.5–8.1)

## 2020-07-29 LAB — URIC ACID: Uric Acid, Serum: 5.7 mg/dL (ref 3.7–8.6)

## 2020-07-29 LAB — LACTATE DEHYDROGENASE: LDH: 139 U/L (ref 98–192)

## 2020-07-29 MED ORDER — VINCRISTINE SULFATE CHEMO INJECTION 1 MG/ML
Freq: Once | INTRAVENOUS | Status: AC
  Filled 2020-07-29: qty 12

## 2020-07-29 MED ORDER — SODIUM CHLORIDE 0.9 % IV SOLN
Freq: Once | INTRAVENOUS | Status: AC
  Administered 2020-07-29: 18 mg via INTRAVENOUS
  Filled 2020-07-29: qty 4

## 2020-07-29 MED ORDER — IBUPROFEN 200 MG PO TABS: 400.0000 mg | ORAL_TABLET | Freq: Four times a day (QID) | ORAL | Status: DC | PRN

## 2020-07-29 MED ORDER — PROCHLORPERAZINE MALEATE 10 MG PO TABS: 10.0000 mg | ORAL_TABLET | Freq: Four times a day (QID) | ORAL | Status: DC | PRN

## 2020-07-29 NOTE — Progress Notes (Signed)
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   1) 04/17/2020: CT A/P showed dominant nodal mass(11.5 x 10.9 cm)within the jejunal mesentery, highly suspicious for lymphoma. Additionally there are left lower lobe pleural-based pulmonary nodules 2) 04/24/2020: establish care with Dr. Lorenso Courier 3) 04/28/2020:  PET CT scan shows Large solid left mesenteric mass 13.0 cm with maximum SUV of 21.2, [place] 5 with local hypermetabolic porta hepatis and retroperitoneal lymph nodes. Constitutes bulky Stage I disease.   Reason for Admission: double hit lymphoma, Cycle #4 EPOCH-R  HPI: Connor Adams is a 74 year old male with medical history significant for double hit DLBCL. He presents for Cycle 4 of R-EPOCH chemotherapy. Risks and benefits have been discussed and he has agreed to proceed  Interval History: The patient tolerated day one of his chemotherapy well overall.  He reports that he still did not sleep very well last night despite taking prednisone earlier in the day.  His IV pump went off multiple times during the night.  He denies mucositis, nausea, vomiting today.  He reported having a small bowel movement last night.  Did not receive MiraLAX until late last evening.  He is also on Senokot-S.  He continues to maintain a good energy level and is ambulating in the hallway without any difficulty.     Diagnosis Date  . Arthritis   . Chronic kidney disease (CKD), stage III (moderate)   . COVID-19   . GERD (gastroesophageal reflux disease)    occ  . Hemorrhoids   . History of ETT 3/05   low risk  . History of hiatal hernia   . History of kidney stones   . HLD (hyperlipidemia)   . HTN (hypertension)   . Left inguinal hernia   . Melanoma ([place])     excied  . 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 Stroke [facility] 2011   tia   . TIA (transient ischemic attack) 4/11   associated w slurred speecha ndmild facial droop. lasted for about 10 minutes. Had MRI, etc with [facility] that per his report was ok (dr. Stephannie Li). Echo (4/11): EF 55-60%, no regional WMAs, normal diastolic function, mild LAE, no source of embolus  Past Surgical History:  Procedure Laterality Date  . BMI 30.2 muscular  12/08/04  . curretage actinic keratosis R ear  01/20/05  . ETT low prob of CAD  11/21/03  . excision melanoma in situ back  01/20/05  . hemorrhoids sclerosed at colonoscopy  12/19/05  . IR IMAGING GUIDED PORT INSERTION  05/19/2020  . LAPAROSCOPIC INGUINAL HERNIA REPAIR Left 12/20/1995  . LUMBAR DISC SURGERY    . NERVE REPAIR     back of neck  . retinal laser surgery Left 09/20/1986  . SEPTOPLASTY  11/19/98  . TOTAL KNEE ARTHROPLASTY Left 12/13/2016   Procedure: TOTAL KNEE ARTHROPLASTY WITH RIGHT KNEE CORTISONE INJECTION;  Surgeon: Frederik Pear, MD;  Location: [place];  Service: Orthopedics;  Laterality: Left;  . uric acid 6.9  12/22/04  . x-ray l great toe  07/21/00       Fre Past Surgical History:  Procedure Laterality Date  . BMI 30.2 muscular  12/08/04  . curretage actinic keratosis R ear  01/20/05  . ETT low prob of CAD  11/21/03  .  excision melanoma in situ back  01/20/05  . hemorrhoids sclerosed at colonoscopy  12/19/05  . IR IMAGING GUIDED PORT INSERTION  05/19/2020  . LAPAROSCOPIC INGUINAL HERNIA REPAIR Left 12/20/1995  . LUMBAR DISC SURGERY    . NERVE REPAIR     back of neck  . retinal laser surgery Left 09/20/1986  . SEPTOPLASTY  11/19/98  . TOTAL KNEE ARTHROPLASTY Left 12/13/2016   Procedure: TOTAL KNEE ARTHROPLASTY WITH RIGHT KNEE CORTISONE INJECTION;  Surgeon: Frederik Pear, MD;  Location: [place];  Service: Orthopedics;  Laterality: Left;  . uric acid 6.9  12/22/04  . x-ray l great toe  07/21/00  quency Provider Last Rate Last Admin  . 0.9 %  sodium  chloride infusion   Intravenous Continuous [name] IV, MD 20 mL/hr at 05/29/20 0400 Rate Verify at 05/29/20 0400  . allopurinol (ZYLOPRIM) tablet 300 mg  300 mg Oral Daily [name] R, NP   300 mg at 05/29/20 1038  . aspirin tablet 325 mg  325 mg Oral Q4H PRN Maryanna Shape, NP      . Chlorhexidine Gluconate Cloth 2 % PADS 6 each  6 each Topical Daily Maryanna Shape, NP   6 each at 05/29/20 1038  . Cold Pack 1 packet  1 packet Topical Once PRN [name] IV, MD      . DOXOrubicin (ADRIAMYCIN) 20 mg, etoposide (VEPESID) 96 mg, vinCRIStine (ONCOVIN) 0.8 mg in sodium chloride 0.9 % 1,000 mL chemo infusion   Intravenous Once Orson Slick, MD 51 mL/hr at 05/28/20 1422 New Bag at 05/28/20 1422  . DOXOrubicin (ADRIAMYCIN) 20 mg, etoposide (VEPESID) 96 mg, vinCRIStine (ONCOVIN) 0.8 mg in sodium chloride 0.9 % 1,000 mL chemo infusion   Intravenous Once Narda Rutherford T IV, MD      . enoxaparin (LOVENOX) injection 40 mg  40 mg Subcutaneous Q24H [name] R, NP   40 mg at 05/28/20 1116  . Hot Pack 1 packet  1 packet Topical Once PRN [name] IV, MD      . hydrocortisone (ANUSOL-HC) 2.5 % rectal cream 1 application  1 application Rectal Daily PRN Maryanna Shape, NP      . losartan (COZAAR) tablet 25 mg  25 mg Oral Daily [name] R, NP   25 mg at 05/29/20 1038  . multivitamin with minerals tablet 1 tablet  1 tablet Oral Daily Maryanna Shape, NP   1 tablet at 05/29/20 1038  . ondansetron (ZOFRAN-ODT) disintegrating tablet 8 mg  8 mg Oral Q8H PRN Dorn Hartshorne R, NP      . pantoprazole (PROTONIX) EC tablet 40 mg  40 mg Oral BID [name] R, NP   40 mg at 05/29/20 1038  . polyethylene glycol (MIRALAX / GLYCOLAX) packet 17 g  17 g Oral Daily PRN Narda Rutherford T IV, MD      . predniSONE (DELTASONE) tablet 120 mg  120 mg Oral Q breakfast [name] IV, MD   120 mg at 05/29/20 0819  . prochlorperazine (COMPAZINE) tablet 10 mg  10 mg Oral Q6H PRN [name] R, NP      . rosuvastatin (CRESTOR) tablet 5 mg  5 mg Oral QPM Baraka Klatt R, NP   5 mg at 05/28/20 1744  . senna-docusate (Senokot-S) tablet 2 tablet  2 tablet Oral BID Orson Slick, MD   2 tablet at 05/29/20 1038  . sodium bicarbonate/sodium chloride mouthwash 1043ml  Mouth Rinse PRN [name] R, NP      . sodium chloride flush (NS) 0.9 % injection 10-40 mL  10-40 mL Intracatheter Q12H Maryanna Shape, NP   10 mL at 05/28/20 2129  . sodium chloride flush (NS) 0.9 % injection 10-40 mL  10-40 mL Intracatheter PRN Apoorva Bugay R, NP      . sucralfate (CARAFATE) 1 GM/10ML suspension 1 g  1 g Oral TID WC & HS Cythia Bachtel, Roselie Awkward, NP   1 g at 05/29/20 5784       Family History  Problem Relation Age of Onset  . Liver cancer Sister   . Heart failure Brother 44       CABGx4  . Heart attack Mother 68  . Uterine cancer Mother   . Heart attack Father 44  . Asthma Son   . Diabetes Brother   . Prostate cancer Brother   Relation Age of Onset  . Liver cancer Sister   . Heart failure Brother 44       CABGx4  . Heart attack Mother 74  . Uterine cancer Mother   . Heart attack Father 35  . Asthma Son   . Diabetes Brother   . Prostate cancer Brother      Socioeconomic History  . Marital status: Married    Spouse name: [name]  . Number of children: 2  . Years of education: 61  . Highest education level: Not on file  Occupational History  . Occupation: Systems developer: [employer]  Tobacco Use  . Smoking status: Never Smoker  . Smokeless tobacco: Never Used  Vaping Use  . Vaping Use: Never used  Substance and Sexual Activity  . Alcohol use: No  . Drug use: No  . Sexual activity: Not on file  Other Topics Concern  . Not on file  Social History Narrative   Married to [employer] at [employer].   Son, [name] married   Son, [name], Married.    Caffeine use: 1 cup coffee per day   Social Determinants of Health   Financial  Resource Strain:   . Difficulty of Paying Living Expenses: Not on file  Food Insecurity:   . Worried About Charity fundraiser in the Last Year: Not on file  . Ran Out of Food in the Last Year: Not on file  Transportation Needs:   . Lack of Transportation (Medical): Not on file  . Lack of Transportation (Non-Medical): Not on file  Physical Activity:   . Days of Exercise per Week: Not on file  . Minutes of Exercise per Session: Not on file  Stress:   . Feeling of Stress : Not on file  Social Connections:   . Frequency of Communication with Friends and Family: Not on file  . Frequency of Social Gatherings with Friends and Family: Not on file  . Attends Religious Services: Not on file  . Active Member of Clubs or Organizations: Not on file  . Attends Archivist Meetings: Not on file  . Marital Status: Not on file  Intimate Partner Violence:   . Fear of Current or Ex-Partner: Not on file  . Emotionally Abused: Not on file  . Physically Abused: Not on file  . Sexually Abused: Not on file  :  Review of Systems: A comprehensive 14 point review of systems was negative except as noted in the HPI.  Exam: Patient Vitals for the  past 24 hrs:  BP Temp Temp src Pulse Resp SpO2  05/29/20 0600 107/60 98 F (36.7 C) Oral (!) 56 20 97 %  05/28/20 2222 125/68 (!) 97.4 F (36.3 C) Oral 60 20 98 %  05/28/20 1503 113/75 (!) 97.4 F (36.3 C) Oral (!) 53 18 96 %    General:  well-nourished in no acute distress.   Eyes:  no scleral icterus.   ENT:  There were no oropharyngeal lesions.   Neck was without thyromegaly.   Lymphatics:  Negative cervical, supraclavicular or axillary adenopathy.   Respiratory: lungs were clear bilaterally without wheezing or crackles.   Cardiovascular:  Regular rate and rhythm, S1/S2, without murmur, rub or gallop.  There was no pedal edema.   GI:  Positive bowel sounds.  Left upper quadrant mass not palpable. Musculoskeletal:  no spinal tenderness of  palpation of vertebral spine.   Skin exam was without echymosis, petichae.   Neuro exam was nonfocal. Patient was alert and oriented.  Attention was good.   Language was appropriate.  Mood was normal without depression.  Speech was not pressured.  Thought content was not tangential.     Lab Results  Component Value Date   WBC 17.9 (H) 05/29/2020   HGB 9.8 (L) 05/29/2020   HCT 30.3 (L) 05/29/2020   PLT 351 05/29/2020   GLUCOSE 141 (H) 05/29/2020   CHOL  12/31/2009    160        ATP III CLASSIFICATION:  <200     mg/dL   Desirable  200-239  mg/dL   Borderline High  >=240    mg/dL   High          TRIG 213 (H) 12/31/2009   HDL 28 (L) 12/31/2009   LDLCALC  12/31/2009    89        Total Cholesterol/HDL:CHD Risk Coronary Heart Disease Risk Table                     Men   Women  1/2 Average Risk   3.4   3.3  Average Risk       5.0   4.4  2 X Average Risk   9.6   7.1  3 X Average Risk  23.4   11.0        Use the calculated Patient Ratio above and the CHD Risk Table to determine the patient's CHD Risk.        ATP III CLASSIFICATION (LDL):  <100     mg/dL   Optimal  100-129  mg/dL   Near or Above                    Optimal  130-159  mg/dL   Borderline  160-189  mg/dL   High  >190     mg/dL   Very High   ALT 34 05/29/2020   AST 29 05/29/2020   NA 139 05/29/2020   K 4.0 05/29/2020   CL 109 05/29/2020   CREATININE 1.09 05/29/2020   BUN 27 (H) 05/29/2020   CO2 24 05/29/2020    CT Biopsy  Result Date: 05/08/2020 CLINICAL DATA:  13 cm left-sided mesenteric abdominal mass. EXAM: CT GUIDED CORE BIOPSY OF MESENTERIC MASS ANESTHESIA/SEDATION: 1.5 mg IV Versed; 75 mcg IV Fentanyl Total Moderate Sedation Time:  18 minutes. The patient's level of consciousness and physiologic status were continuously monitored during the procedure by Radiology nursing. PROCEDURE: The procedure risks, benefits, and alternatives were  explained to the patient. Questions regarding the procedure were  encouraged and answered. The patient understands and consents to the procedure. A time-out was performed prior to initiating the procedure. CT was performed through the abdomen in a supine position. The left abdominal wall was prepped with chlorhexidine in a sterile fashion, and a sterile drape was applied covering the operative field. A sterile gown and sterile gloves were used for the procedure. Local anesthesia was provided with 1% Lidocaine. Under CT guidance, a 17 gauge trocar needle was advanced to the level of a left-sided mesenteric mass. After confirming needle tip position, 4 separate coaxial 18 gauge core biopsy samples were obtained and submitted in saline. Some Gel-Foam pledgets were advanced through the outer needle as the needle was retracted and removed. COMPLICATIONS: None FINDINGS: Large left-sided intra-abdominal mass within the mesentery measures up to 13.3 cm in maximum diameter. Solid tissue was obtained. IMPRESSION: CT-guided core biopsy performed of large left-sided mesenteric mass measuring just over 13 cm in estimated maximal diameter. Electronically Signed   By: Aletta Edouard M.D.   On: 05/08/2020 15:02   ECHOCARDIOGRAM COMPLETE  Result Date: 05/22/2020    ECHOCARDIOGRAM REPORT   Patient Name:   Connor Adams [facility] Date of Exam: 05/22/2020 Medical Rec #:  [id number]        Height:       68.0 in Accession #:    [id number]       Weight:       171.0 lb Date of Birth:  08/05/1946        BSA:          1.912 m Patient Age:    68 years         BP:           116/77 mmHg Patient Gender: M                HR:           80 bpm. Exam Location:  Outpatient Procedure: 2D Echo, Cardiac Doppler and Color Doppler Indications:    Chemotherapy evaluation v87.41 / v58.11  History:        Patient has no prior history of Echocardiogram examinations. TIA                 and Stroke; Risk Factors:Hypertension, Dyslipidemia and GERD.  Sonographer:    Bernadene Person RDCS Referring Phys: [id number] [place]  1. Left ventricular ejection fraction, by estimation, is 60 to 65%. The left ventricle has normal function. The left ventricle has no regional wall motion abnormalities. Left ventricular diastolic parameters are consistent with Grade I diastolic dysfunction (impaired relaxation). The average left ventricular global longitudinal strain is -17.6 %. The global longitudinal strain is normal.  2. Right ventricular systolic function is normal. The right ventricular size is normal.  3. The mitral valve is normal in structure. No evidence of mitral valve regurgitation. No evidence of mitral stenosis.  4. The aortic valve is normal in structure. Aortic valve regurgitation is not visualized. No aortic stenosis is present.  5. The inferior vena cava is normal in size with greater than 50% respiratory variability, suggesting right atrial pressure of 3 mmHg. FINDINGS  Left Ventricle: Left ventricular ejection fraction, by estimation, is 60 to 65%. The left ventricle has normal function. The left ventricle has no regional wall motion abnormalities. The average left ventricular global longitudinal strain is -17.6 %. The global longitudinal strain is normal. The left ventricular internal  cavity size was normal in size. There is no left ventricular hypertrophy. Left ventricular diastolic parameters are consistent with Grade I diastolic dysfunction (impaired relaxation). Right Ventricle: The right ventricular size is normal. No increase in right ventricular wall thickness. Right ventricular systolic function is normal. Left Atrium: Left atrial size was normal in size. Right Atrium: Right atrial size was normal in size. Pericardium: There is no evidence of pericardial effusion. Mitral Valve: The mitral valve is normal in structure. Normal mobility of the mitral valve leaflets. No evidence of mitral valve regurgitation. No evidence of mitral valve stenosis. Tricuspid Valve: The tricuspid valve is normal in structure.  Tricuspid valve regurgitation is not demonstrated. No evidence of tricuspid stenosis. Aortic Valve: The aortic valve is normal in structure. Aortic valve regurgitation is not visualized. No aortic stenosis is present. Pulmonic Valve: The pulmonic valve was normal in structure. Pulmonic valve regurgitation is trivial. No evidence of pulmonic stenosis. Aorta: The aortic root is normal in size and structure. Venous: The inferior vena cava is normal in size with greater than 50% respiratory variability, suggesting right atrial pressure of 3 mmHg. IAS/Shunts: No atrial level shunt detected by color flow Doppler.  LEFT VENTRICLE PLAX 2D LVIDd:         5.10 cm  Diastology LVIDs:         3.20 cm  LV e' lateral:   9.94 cm/s LV PW:         0.70 cm  LV E/e' lateral: 4.6 LV IVS:        0.70 cm  LV e' medial:    6.85 cm/s LVOT diam:     2.10 cm  LV E/e' medial:  6.6 LV SV:         81 LV SV Index:   43       2D Longitudinal Strain LVOT Area:     3.46 cm 2D Strain GLS Avg:     -17.6 %  RIGHT VENTRICLE RV S prime:     9.79 cm/s TAPSE (M-mode): 1.6 cm LEFT ATRIUM             Index       RIGHT ATRIUM           Index LA diam:        3.70 cm 1.94 cm/m  RA Area:     14.40 cm LA Vol (A2C):   69.5 ml 36.35 ml/m RA Volume:   31.90 ml  16.68 ml/m LA Vol (A4C):   51.0 ml 26.67 ml/m LA Biplane Vol: 59.4 ml 31.07 ml/m  AORTIC VALVE LVOT Vmax:   143.00 cm/s LVOT Vmean:  98.900 cm/s LVOT VTI:    0.235 m  AORTA Ao Root diam: 3.20 cm Ao Asc diam:  3.40 cm MITRAL VALVE MV Area (PHT): 2.22 cm    SHUNTS MV Decel Time: 342 msec    Systemic VTI:  0.24 m MV E velocity: 45.30 cm/s  Systemic Diam: 2.10 cm MV A velocity: 72.50 cm/s MV E/A ratio:  0.62 Connor Schultz MD Electronically signed by Connor Schultz MD Signature Date/Time: 05/22/2020/12:55:40 PM    Final    IR IMAGING GUIDED PORT INSERTION  Result Date: 05/19/2020 INDICATION: History of diffuse large B-cell lymphoma. In need of durable intravenous access for chemotherapy administration EXAM:  IMPLANTED PORT A CATH PLACEMENT WITH ULTRASOUND AND FLUOROSCOPIC GUIDANCE COMPARISON:  PET-CT-04/28/2020 MEDICATIONS: Ancef 2 gm IV; The antibiotic was administered within an appropriate time interval prior to skin puncture. ANESTHESIA/SEDATION: Moderate (conscious) sedation  was employed during this procedure. A total of Versed 2 mg and Fentanyl 100 mcg was administered intravenously. Moderate Sedation Time: 25 minutes. The patient's level of consciousness and vital signs were monitored continuously by radiology nursing throughout the procedure under my direct supervision. CONTRAST:  None FLUOROSCOPY TIME:  24 seconds (5 mGy) COMPLICATIONS: None immediate. PROCEDURE: The procedure, risks, benefits, and alternatives were explained to the patient. Questions regarding the procedure were encouraged and answered. The patient understands and consents to the procedure. The right neck and chest were prepped with chlorhexidine in a sterile fashion, and a sterile drape was applied covering the operative field. Maximum barrier sterile technique with sterile gowns and gloves were used for the procedure. A timeout was performed prior to the initiation of the procedure. Local anesthesia was provided with 1% lidocaine with epinephrine. After creating a small venotomy incision, a micropuncture kit was utilized to access the internal jugular vein. Real-time ultrasound guidance was utilized for vascular access including the acquisition of a permanent ultrasound image documenting patency of the accessed vessel. The microwire was utilized to measure appropriate catheter length. A subcutaneous port pocket was then created along the upper chest wall utilizing a combination of sharp and blunt dissection. The pocket was irrigated with sterile saline. A single lumen "Slim" sized power injectable port was chosen for placement. The 8 Fr catheter was tunneled from the port pocket site to the venotomy incision. The port was placed in the  pocket. The external catheter was trimmed to appropriate length. At the venotomy, an 8 Fr peel-away sheath was placed over a guidewire under fluoroscopic guidance. The catheter was then placed through the sheath and the sheath was removed. Final catheter positioning was confirmed and documented with a fluoroscopic spot radiograph. The port was accessed with a Huber needle, aspirated and flushed with heparinized saline. The venotomy site was closed with an interrupted 4-0 Vicryl suture. The port pocket incision was closed with interrupted 2-0 Vicryl suture. The skin was opposed with a running subcuticular 4-0 Vicryl suture. Dermabond and Steri-strips were applied to both incisions. Dressings were applied. The patient tolerated the procedure well without immediate post procedural complication. FINDINGS: After catheter placement, the tip lies within the superior cavoatrial junction. The catheter aspirates and flushes normally and is ready for immediate use. IMPRESSION: Successful placement of a right internal jugular approach power injectable Port-A-Cath. The catheter is ready for immediate use. Electronically Signed   By: Connor Adams M.D.   On: 05/19/2020 15:59     CT Biopsy  Result Date: 05/08/2020 CLINICAL DATA:  13 cm left-sided mesenteric abdominal mass. EXAM: CT GUIDED CORE BIOPSY OF MESENTERIC MASS ANESTHESIA/SEDATION: 1.5 mg IV Versed; 75 mcg IV Fentanyl Total Moderate Sedation Time:  18 minutes. The patient's level of consciousness and physiologic status were continuously monitored during the procedure by Radiology nursing. PROCEDURE: The procedure risks, benefits, and alternatives were explained to the patient. Questions regarding the procedure were encouraged and answered. The patient understands and consents to the procedure. A time-out was performed prior to initiating the procedure. CT was performed through the abdomen in a supine position. The left abdominal wall was prepped with chlorhexidine in a  sterile fashion, and a sterile drape was applied covering the operative field. A sterile gown and sterile gloves were used for the procedure. Local anesthesia was provided with 1% Lidocaine. Under CT guidance, a 17 gauge trocar needle was advanced to the level of a left-sided mesenteric mass. After confirming needle tip position, 4 separate  coaxial 18 gauge core biopsy samples were obtained and submitted in saline. Some Gel-Foam pledgets were advanced through the outer needle as the needle was retracted and removed. COMPLICATIONS: None FINDINGS: Large left-sided intra-abdominal mass within the mesentery measures up to 13.3 cm in maximum diameter. Solid tissue was obtained. IMPRESSION: CT-guided core biopsy performed of large left-sided mesenteric mass measuring just over 13 cm in estimated maximal diameter. Electronically Signed   By: Aletta Edouard M.D.   On: 05/08/2020 15:02   ECHOCARDIOGRAM COMPLETE  Result Date: 05/22/2020    ECHOCARDIOGRAM REPORT   Patient Name:   Connor Adams [facility] Date of Exam: 05/22/2020 Medical Rec #:  [id number]        Height:       68.0 in Accession #:    [id number]       Weight:       171.0 lb Date of Birth:  [birth year]        BSA:          1.912 m Patient Age:    50 years         BP:           116/77 mmHg Patient Gender: M                HR:           80 bpm. Exam Location:  Outpatient Procedure: 2D Echo, Cardiac Doppler and Color Doppler Indications:    Chemotherapy evaluation v87.41 / v58.11  History:        Patient has no prior history of Echocardiogram examinations. TIA                 and Stroke; Risk Factors:Hypertension, Dyslipidemia and GERD.  Sonographer:    Bernadene Person RDCS Referring Phys: [id number] [place]  1. Left ventricular ejection fraction, by estimation, is 60 to 65%. The left ventricle has normal function. The left ventricle has no regional wall motion abnormalities. Left ventricular diastolic parameters are consistent with Grade I diastolic  dysfunction (impaired relaxation). The average left ventricular global longitudinal strain is -17.6 %. The global longitudinal strain is normal.  2. Right ventricular systolic function is normal. The right ventricular size is normal.  3. The mitral valve is normal in structure. No evidence of mitral valve regurgitation. No evidence of mitral stenosis.  4. The aortic valve is normal in structure. Aortic valve regurgitation is not visualized. No aortic stenosis is present.  5. The inferior vena cava is normal in size with greater than 50% respiratory variability, suggesting right atrial pressure of 3 mmHg. FINDINGS  Left Ventricle: Left ventricular ejection fraction, by estimation, is 60 to 65%. The left ventricle has normal function. The left ventricle has no regional wall motion abnormalities. The average left ventricular global longitudinal strain is -17.6 %. The global longitudinal strain is normal. The left ventricular internal cavity size was normal in size. There is no left ventricular hypertrophy. Left ventricular diastolic parameters are consistent with Grade I diastolic dysfunction (impaired relaxation). Right Ventricle: The right ventricular size is normal. No increase in right ventricular wall thickness. Right ventricular systolic function is normal. Left Atrium: Left atrial size was normal in size. Right Atrium: Right atrial size was normal in size. Pericardium: There is no evidence of pericardial effusion. Mitral Valve: The mitral valve is normal in structure. Normal mobility of the mitral valve leaflets. No evidence of mitral valve regurgitation. No evidence of mitral valve stenosis. Tricuspid Valve: The tricuspid valve  is normal in structure. Tricuspid valve regurgitation is not demonstrated. No evidence of tricuspid stenosis. Aortic Valve: The aortic valve is normal in structure. Aortic valve regurgitation is not visualized. No aortic stenosis is present. Pulmonic Valve: The pulmonic valve was normal  in structure. Pulmonic valve regurgitation is trivial. No evidence of pulmonic stenosis. Aorta: The aortic root is normal in size and structure. Venous: The inferior vena cava is normal in size with greater than 50% respiratory variability, suggesting right atrial pressure of 3 mmHg. IAS/Shunts: No atrial level shunt detected by color flow Doppler.  LEFT VENTRICLE PLAX 2D LVIDd:         5.10 cm  Diastology LVIDs:         3.20 cm  LV e' lateral:   9.94 cm/s LV PW:         0.70 cm  LV E/e' lateral: 4.6 LV IVS:        0.70 cm  LV e' medial:    6.85 cm/s LVOT diam:     2.10 cm  LV E/e' medial:  6.6 LV SV:         81 LV SV Index:   43       2D Longitudinal Strain LVOT Area:     3.46 cm 2D Strain GLS Avg:     -17.6 %  RIGHT VENTRICLE RV S prime:     9.79 cm/s TAPSE (M-mode): 1.6 cm LEFT ATRIUM             Index       RIGHT ATRIUM           Index LA diam:        3.70 cm 1.94 cm/m  RA Area:     14.40 cm LA Vol (A2C):   69.5 ml 36.35 ml/m RA Volume:   31.90 ml  16.68 ml/m LA Vol (A4C):   51.0 ml 26.67 ml/m LA Biplane Vol: 59.4 ml 31.07 ml/m  AORTIC VALVE LVOT Vmax:   143.00 cm/s LVOT Vmean:  98.900 cm/s LVOT VTI:    0.235 m  AORTA Ao Root diam: 3.20 cm Ao Asc diam:  3.40 cm MITRAL VALVE MV Area (PHT): 2.22 cm    SHUNTS MV Decel Time: 342 msec    Systemic VTI:  0.24 m MV E velocity: 45.30 cm/s  Systemic Diam: 2.10 cm MV A velocity: 72.50 cm/s MV E/A ratio:  0.62 Connor Furbish MD Electronically signed by Connor Furbish MD Signature Date/Time: 05/22/2020/12:55:40 PM    Final    IR IMAGING GUIDED PORT INSERTION  Result Date: 05/19/2020 INDICATION: History of diffuse large B-cell lymphoma. In need of durable intravenous access for chemotherapy administration EXAM: IMPLANTED PORT A CATH PLACEMENT WITH ULTRASOUND AND FLUOROSCOPIC GUIDANCE COMPARISON:  PET-CT-04/28/2020 MEDICATIONS: Ancef 2 gm IV; The antibiotic was administered within an appropriate time interval prior to skin puncture. ANESTHESIA/SEDATION: Moderate  (conscious) sedation was employed during this procedure. A total of Versed 2 mg and Fentanyl 100 mcg was administered intravenously. Moderate Sedation Time: 25 minutes. The patient's level of consciousness and vital signs were monitored continuously by radiology nursing throughout the procedure under my direct supervision. CONTRAST:  None FLUOROSCOPY TIME:  24 seconds (5 mGy) COMPLICATIONS: None immediate. PROCEDURE: The procedure, risks, benefits, and alternatives were explained to the patient. Questions regarding the procedure were encouraged and answered. The patient understands and consents to the procedure. The right neck and chest were prepped with chlorhexidine in a sterile fashion, and a sterile drape was applied covering  the operative field. Maximum barrier sterile technique with sterile gowns and gloves were used for the procedure. A timeout was performed prior to the initiation of the procedure. Local anesthesia was provided with 1% lidocaine with epinephrine. After creating a small venotomy incision, a micropuncture kit was utilized to access the internal jugular vein. Real-time ultrasound guidance was utilized for vascular access including the acquisition of a permanent ultrasound image documenting patency of the accessed vessel. The microwire was utilized to measure appropriate catheter length. A subcutaneous port pocket was then created along the upper chest wall utilizing a combination of sharp and blunt dissection. The pocket was irrigated with sterile saline. A single lumen "Slim" sized power injectable port was chosen for placement. The 8 Fr catheter was tunneled from the port pocket site to the venotomy incision. The port was placed in the pocket. The external catheter was trimmed to appropriate length. At the venotomy, an 8 Fr peel-away sheath was placed over a guidewire under fluoroscopic guidance. The catheter was then placed through the sheath and the sheath was removed. Final catheter  positioning was confirmed and documented with a fluoroscopic spot radiograph. The port was accessed with a Huber needle, aspirated and flushed with heparinized saline. The venotomy site was closed with an interrupted 4-0 Vicryl suture. The port pocket incision was closed with interrupted 2-0 Vicryl suture. The skin was opposed with a running subcuticular 4-0 Vicryl suture. Dermabond and Steri-strips were applied to both incisions. Dressings were applied. The patient tolerated the procedure well without immediate post procedural complication. FINDINGS: After catheter placement, the tip lies within the superior cavoatrial junction. The catheter aspirates and flushes normally and is ready for immediate use. IMPRESSION: Successful placement of a right internal jugular approach power injectable Port-A-Cath. The catheter is ready for immediate use. Electronically Signed   By: Connor Adams M.D.   On: 05/19/2020 15:59    Assessment and Plan:  Connor Adams 74 y.o. male with medical history significant for Stage I DLBCL who presents for a follow up visit.  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  He is currently receiving cycle #2 of EPOCH-R starting on 06/16/2020.  Today he is Cycle 4 Day 2 of treatment.  He is tolerating his chemotherapy well so far with no nausea or vomiting.  Bowels are moving and he has a good appetite.  There are no barriers to proceeding with treatment.      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 Stage I bulky disease.   --Continue EPOCH-R. Today is Cycle 4 Day 2 --Adverse effects have been reviewed including,  but not limited to, alopecia, myelosuppression, peripheral neuropathy, mucositis, liver/renal dysfunction, and possible infusion reaction associated with Rituxan. He agrees to proceed. --PAC in place and ready for use. --Echocardiogram from 05/22/2020 shows LVEF of 60-65%  --CBC adequate to proceed with treatment today.  Leukocytosis likely related to steroids.  Anemia slightly worse this morning.  --Will monitor closely with CBC with diff, CMET, LDH, and uric acid --continue Allopurinol 300 mg daily.  May consider stopping allopurinol if uric acid remains stable. --Lovenox for DVT prophylaxis. --Continue home medications for HTN and hyperlipidemia.  --  Continue PRN antiemetics. --Continue MiraLAX and Senokot.  The patient will return to the cancer center on 08/04/2020 for Rituxan and Neulasta and will have labs and a follow-up visit on 08/07/2020.  [name], DNP, AGPCNP-BC, AOCNP Mon/Tues/Thurs/Fri 7am-5pm; Off Wednesdays Cell: [phone]

## 2020-07-30 LAB — CBC WITH DIFFERENTIAL/PLATELET
Abs Immature Granulocytes: 0.19 10*3/uL — ABNORMAL HIGH (ref 0.00–0.07)
Basophils Absolute: 0 10*3/uL (ref 0.0–0.1)
Basophils Relative: 0 %
Eosinophils Absolute: 0 10*3/uL (ref 0.0–0.5)
Eosinophils Relative: 0 %
HCT: 26 % — ABNORMAL LOW (ref 39.0–52.0)
Hemoglobin: 8.1 g/dL — ABNORMAL LOW (ref 13.0–17.0)
Immature Granulocytes: 1 %
Lymphocytes Relative: 3 %
Lymphs Abs: 0.6 10*3/uL — ABNORMAL LOW (ref 0.7–4.0)
MCH: 29 pg (ref 26.0–34.0)
MCHC: 31.2 g/dL (ref 30.0–36.0)
MCV: 93.2 fL (ref 80.0–100.0)
Monocytes Absolute: 0.6 10*3/uL (ref 0.1–1.0)
Monocytes Relative: 3 %
Neutro Abs: 17.7 10*3/uL — ABNORMAL HIGH (ref 1.7–7.7)
Neutrophils Relative %: 93 %
Platelets: 357 10*3/uL (ref 150–400)
RBC: 2.79 MIL/uL — ABNORMAL LOW (ref 4.22–5.81)
RDW: 20.5 % — ABNORMAL HIGH (ref 11.5–15.5)
WBC: 19.2 10*3/uL — ABNORMAL HIGH (ref 4.0–10.5)
nRBC: 0 % (ref 0.0–0.2)

## 2020-07-30 LAB — COMPREHENSIVE METABOLIC PANEL
ALT: 23 U/L (ref 0–44)
AST: 19 U/L (ref 15–41)
Albumin: 3 g/dL — ABNORMAL LOW (ref 3.5–5.0)
Alkaline Phosphatase: 50 U/L (ref 38–126)
Anion gap: 5 (ref 5–15)
BUN: 29 mg/dL — ABNORMAL HIGH (ref 8–23)
CO2: 24 mmol/L (ref 22–32)
Calcium: 8.9 mg/dL (ref 8.9–10.3)
Chloride: 110 mmol/L (ref 98–111)
Creatinine, Ser: 1.05 mg/dL (ref 0.61–1.24)
GFR, Estimated: >60 mL/min (ref >60)
Glucose, Bld: 128 mg/dL — ABNORMAL HIGH (ref 70–99)
Potassium: 4 mmol/L (ref 3.5–5.1)
Sodium: 139 mmol/L (ref 135–145)
Total Bilirubin: <0.1 mg/dL — ABNORMAL LOW (ref 0.3–1.2)
Total Protein: 5.5 g/dL — ABNORMAL LOW (ref 6.5–8.1)

## 2020-07-30 LAB — URIC ACID: Uric Acid, Serum: 4.8 mg/dL (ref 3.7–8.6)

## 2020-07-30 LAB — LACTATE DEHYDROGENASE: LDH: 131 U/L (ref 98–192)

## 2020-07-30 MED ORDER — SODIUM CHLORIDE 0.9 % IV SOLN
Freq: Once | INTRAVENOUS | Status: AC
  Administered 2020-07-30: 18 mg via INTRAVENOUS
  Filled 2020-07-30: qty 4

## 2020-07-30 MED ORDER — VINCRISTINE SULFATE CHEMO INJECTION 1 MG/ML
Freq: Once | INTRAVENOUS | Status: AC
  Filled 2020-07-30: qty 12

## 2020-07-31 LAB — CBC WITH DIFFERENTIAL/PLATELET
Abs Immature Granulocytes: 0.12 10*3/uL — ABNORMAL HIGH (ref 0.00–0.07)
Basophils Absolute: 0 10*3/uL (ref 0.0–0.1)
Basophils Relative: 0 %
Eosinophils Absolute: 0 10*3/uL (ref 0.0–0.5)
Eosinophils Relative: 0 %
HCT: 25.4 % — ABNORMAL LOW (ref 39.0–52.0)
Hemoglobin: 7.9 g/dL — ABNORMAL LOW (ref 13.0–17.0)
Immature Granulocytes: 1 %
Lymphocytes Relative: 2 %
Lymphs Abs: 0.2 10*3/uL — ABNORMAL LOW (ref 0.7–4.0)
MCH: 29 pg (ref 26.0–34.0)
MCHC: 31.1 g/dL (ref 30.0–36.0)
MCV: 93.4 fL (ref 80.0–100.0)
Monocytes Absolute: 0.4 10*3/uL (ref 0.1–1.0)
Monocytes Relative: 3 %
Neutro Abs: 13.3 10*3/uL — ABNORMAL HIGH (ref 1.7–7.7)
Neutrophils Relative %: 94 %
Platelets: 347 10*3/uL (ref 150–400)
RBC: 2.72 MIL/uL — ABNORMAL LOW (ref 4.22–5.81)
RDW: 20.4 % — ABNORMAL HIGH (ref 11.5–15.5)
WBC: 14.1 10*3/uL — ABNORMAL HIGH (ref 4.0–10.5)
nRBC: 0 % (ref 0.0–0.2)

## 2020-07-31 LAB — COMPREHENSIVE METABOLIC PANEL
ALT: 22 U/L (ref 0–44)
AST: 19 U/L (ref 15–41)
Albumin: 2.9 g/dL — ABNORMAL LOW (ref 3.5–5.0)
Alkaline Phosphatase: 45 U/L (ref 38–126)
Anion gap: 7 (ref 5–15)
BUN: 32 mg/dL — ABNORMAL HIGH (ref 8–23)
CO2: 22 mmol/L (ref 22–32)
Calcium: 8.7 mg/dL — ABNORMAL LOW (ref 8.9–10.3)
Chloride: 110 mmol/L (ref 98–111)
Creatinine, Ser: 0.97 mg/dL (ref 0.61–1.24)
GFR, Estimated: >60 mL/min (ref >60)
Glucose, Bld: 162 mg/dL — ABNORMAL HIGH (ref 70–99)
Potassium: 4.1 mmol/L (ref 3.5–5.1)
Sodium: 139 mmol/L (ref 135–145)
Total Bilirubin: 0.4 mg/dL (ref 0.3–1.2)
Total Protein: 5.3 g/dL — ABNORMAL LOW (ref 6.5–8.1)

## 2020-07-31 LAB — LACTATE DEHYDROGENASE: LDH: 117 U/L (ref 98–192)

## 2020-07-31 LAB — URIC ACID: Uric Acid, Serum: 4.5 mg/dL (ref 3.7–8.6)

## 2020-07-31 MED ORDER — SODIUM CHLORIDE 0.9 % IV SOLN
Freq: Once | INTRAVENOUS | Status: AC
  Administered 2020-07-31: 18 mg via INTRAVENOUS
  Filled 2020-07-31: qty 4

## 2020-07-31 MED ORDER — VINCRISTINE SULFATE CHEMO INJECTION 1 MG/ML
Freq: Once | INTRAVENOUS | Status: AC
  Filled 2020-07-31: qty 12

## 2020-07-31 NOTE — Care Management Important Message (Signed)
Important Message  Patient Details IM Letter given to the Patient Name: Connor Adams MRN: [id number] Date of Birth: October 01, 1945   Medicare Important Message Given:  Yes     [name] 07/31/2020, 9:22 AM

## 2020-07-31 NOTE — Progress Notes (Signed)
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   1) 04/17/2020: CT A/P showed dominant nodal mass(11.5 x 10.9 cm)within the jejunal mesentery, highly suspicious for lymphoma. Additionally there are left lower lobe pleural-based pulmonary nodules 2) 04/24/2020: establish care with Dr. Lorenso Courier 3) 04/28/2020:  PET CT scan shows Large solid left mesenteric mass 13.0 cm with maximum SUV of 21.2, [place] 5 with local hypermetabolic porta hepatis and retroperitoneal lymph nodes. Constitutes bulky Stage I disease.   Reason for Admission: double hit lymphoma, Cycle #4 EPOCH-R  HPI: Connor Adams is a 74 year old male with medical history significant for double hit DLBCL. He presents for Cycle 4 of R-EPOCH chemotherapy. Risks and benefits have been discussed and he has agreed to proceed  Interval History: --bowel movement last night and early this AM.  --maintains good appetite with no N/V/D --no fevers/ chills / sweats overnight -- continues ambulating without difficulty -- Hgb drop to 8.1    Diagnosis Date  . Arthritis   . Chronic kidney disease (CKD), stage III (moderate)   . COVID-19   . GERD (gastroesophageal reflux disease)    occ  . Hemorrhoids   . History of ETT 3/05   low risk  . History of hiatal hernia   . History of kidney stones   . HLD (hyperlipidemia)   . HTN (hypertension)   . Left inguinal hernia   . Melanoma ([place])    excied  . 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 Stroke [facility]) 2011   tia   . TIA (transient ischemic attack) 4/11   associated w slurred speecha ndmild facial droop. lasted for about 10 minutes. Had MRI, etc with [facility] that per his report was ok (dr. Stephannie Li). Echo (4/11): EF 55-60%, no  regional WMAs, normal diastolic function, mild LAE, no source of embolus  Past Surgical History:  Procedure Laterality Date  . BMI 30.2 muscular  12/08/04  . curretage actinic keratosis R ear  01/20/05  . ETT low prob of CAD  11/21/03  . excision melanoma in situ back  01/20/05  . hemorrhoids sclerosed at colonoscopy  12/19/05  . IR IMAGING GUIDED PORT INSERTION  05/19/2020  . LAPAROSCOPIC INGUINAL HERNIA REPAIR Left 12/20/1995  . LUMBAR DISC SURGERY    . NERVE REPAIR     back of neck  . retinal laser surgery Left 09/20/1986  . SEPTOPLASTY  11/19/98  . TOTAL KNEE ARTHROPLASTY Left 12/13/2016   Procedure: TOTAL KNEE ARTHROPLASTY WITH RIGHT KNEE CORTISONE INJECTION;  Surgeon: Frederik Pear, MD;  Location: [place];  Service: Orthopedics;  Laterality: Left;  . uric acid 6.9  12/22/04  . x-ray l great toe  07/21/00       [facility] Past Surgical History:  Procedure Laterality Date  . BMI 30.2 muscular  12/08/04  . curretage actinic keratosis R ear  01/20/05  . ETT low prob of CAD  11/21/03  . excision melanoma in situ back  01/20/05  . hemorrhoids sclerosed at colonoscopy  12/19/05  . IR IMAGING GUIDED PORT INSERTION  05/19/2020  . LAPAROSCOPIC INGUINAL HERNIA REPAIR Left 12/20/1995  . LUMBAR DISC SURGERY    . NERVE REPAIR     back of neck  . retinal laser surgery Left 09/20/1986  . SEPTOPLASTY  11/19/98  . TOTAL KNEE ARTHROPLASTY Left 12/13/2016   Procedure: TOTAL KNEE ARTHROPLASTY WITH RIGHT KNEE CORTISONE INJECTION;  Surgeon: Frederik Pear, MD;  Location: [place];  Service: Orthopedics;  Laterality: Left;  . uric acid 6.9  12/22/04  . x-ray l great toe  07/21/00  quency Provider Last Rate Last Admin  . 0.9 %  sodium chloride infusion   Intravenous Continuous [name] IV, MD 20 mL/hr at 05/29/20 0400 Rate Verify at 05/29/20 0400  . allopurinol (ZYLOPRIM) tablet 300 mg  300 mg Oral Daily [name] R, NP   300 mg at 05/29/20 1038  . aspirin tablet 325 mg  325 mg Oral Q4H PRN Maryanna Shape, NP      .  Chlorhexidine Gluconate Cloth 2 % PADS 6 each  6 each Topical Daily Maryanna Shape, NP   6 each at 05/29/20 1038  . Cold Pack 1 packet  1 packet Topical Once PRN [name] IV, MD      . DOXOrubicin (ADRIAMYCIN) 20 mg, etoposide (VEPESID) 96 mg, vinCRIStine (ONCOVIN) 0.8 mg in sodium chloride 0.9 % 1,000 mL chemo infusion   Intravenous Once Orson Slick, MD 51 mL/hr at 05/28/20 1422 New Bag at 05/28/20 1422  . DOXOrubicin (ADRIAMYCIN) 20 mg, etoposide (VEPESID) 96 mg, vinCRIStine (ONCOVIN) 0.8 mg in sodium chloride 0.9 % 1,000 mL chemo infusion   Intravenous Once Narda Rutherford T IV, MD      . enoxaparin (LOVENOX) injection 40 mg  40 mg Subcutaneous Q24H [name] R, NP   40 mg at 05/28/20 1116  . Hot Pack 1 packet  1 packet Topical Once PRN [name] IV, MD      . hydrocortisone (ANUSOL-HC) 2.5 % rectal cream 1 application  1 application Rectal Daily PRN Maryanna Shape, NP      . losartan (COZAAR) tablet 25 mg  25 mg Oral Daily [name] R, NP   25 mg at 05/29/20 1038  . multivitamin with minerals tablet 1 tablet  1 tablet Oral Daily Maryanna Shape, NP   1 tablet at 05/29/20 1038  . ondansetron (ZOFRAN-ODT) disintegrating tablet 8 mg  8 mg Oral Q8H PRN Curcio, Kristin R, NP      . pantoprazole (PROTONIX) EC tablet 40 mg  40 mg Oral BID [name] R, NP   40 mg at 05/29/20 1038  . polyethylene glycol (MIRALAX / GLYCOLAX) packet 17 g  17 g Oral Daily PRN Narda Rutherford T IV, MD      . predniSONE (DELTASONE) tablet 120 mg  120 mg Oral Q breakfast [name] IV, MD   120 mg at 05/29/20 0819  . prochlorperazine (COMPAZINE) tablet 10 mg  10 mg Oral Q6H PRN [name] R, NP      . rosuvastatin (CRESTOR) tablet 5 mg  5 mg Oral QPM Curcio, Kristin R, NP   5 mg at 05/28/20 1744  . senna-docusate (Senokot-S) tablet 2 tablet  2 tablet Oral BID Orson Slick, MD   2 tablet at 05/29/20 1038  . sodium bicarbonate/sodium chloride mouthwash 1039m   Mouth Rinse PRN  Curcio, Kristin R, NP      . sodium chloride flush (NS) 0.9 % injection 10-40 mL  10-40 mL Intracatheter Q12H CMaryanna Shape NP   10 mL at 05/28/20 2129  . sodium chloride flush (NS) 0.9 % injection 10-40 mL  10-40 mL Intracatheter PRN CMaryanna Shape NP      .  sucralfate (CARAFATE) 1 GM/10ML suspension 1 g  1 g Oral TID WC & HS Maryanna Shape, NP   1 g at 05/29/20 6333       Family History  Problem Relation Age of Onset  . Liver cancer Sister   . Heart failure Brother 44       CABGx4  . Heart attack Mother 30  . Uterine cancer Mother   . Heart attack Father 66  . Asthma Son   . Diabetes Brother   . Prostate cancer Brother   Relation Age of Onset  . Liver cancer Sister   . Heart failure Brother 44       CABGx4  . Heart attack Mother 52  . Uterine cancer Mother   . Heart attack Father 58  . Asthma Son   . Diabetes Brother   . Prostate cancer Brother      Socioeconomic History  . Marital status: Married    Spouse name: [name]  . Number of children: 2  . Years of education: 13  . Highest education level: Not on file  Occupational History  . Occupation: Systems developer: [employer]  Tobacco Use  . Smoking status: Never Smoker  . Smokeless tobacco: Never Used  Vaping Use  . Vaping Use: Never used  Substance and Sexual Activity  . Alcohol use: No  . Drug use: No  . Sexual activity: Not on file  Other Topics Concern  . Not on file  Social History Narrative   Married to [employer] at [employer].   Son, [name] married   Son, [name], Married.    Caffeine use: 1 cup coffee per day   Social Determinants of Health   Financial Resource Strain:   . Difficulty of Paying Living Expenses: Not on file  Food Insecurity:   . Worried About Charity fundraiser in the Last Year: Not on file  . Ran Out of Food in the Last Year: Not on file  Transportation Needs:   . Lack of Transportation (Medical): Not on file  . Lack of  Transportation (Non-Medical): Not on file  Physical Activity:   . Days of Exercise per Week: Not on file  . Minutes of Exercise per Session: Not on file  Stress:   . Feeling of Stress : Not on file  Social Connections:   . Frequency of Communication with Friends and Family: Not on file  . Frequency of Social Gatherings with Friends and Family: Not on file  . Attends Religious Services: Not on file  . Active Member of Clubs or Organizations: Not on file  . Attends Archivist Meetings: Not on file  . Marital Status: Not on file  Intimate Partner Violence:   . Fear of Current or Ex-Partner: Not on file  . Emotionally Abused: Not on file  . Physically Abused: Not on file  . Sexually Abused: Not on file  :  Review of Systems: A comprehensive 14 point review of systems was negative except as noted in the HPI.  Exam: Patient Vitals for the past 24 hrs:  BP Temp Temp src Pulse Resp SpO2  05/29/20 0600 107/60 98 F (36.7 C) Oral (!) 56 20 97 %  05/28/20 2222 125/68 (!) 97.4 F (36.3 C) Oral 60 20 98 %  05/28/20 1503 113/75 (!) 97.4 F (36.3 C) Oral (!) 53 18 96 %    General:  well-nourished in no acute distress.  Eyes:  no scleral icterus.   ENT:  There were no oropharyngeal lesions.   Neck was without thyromegaly.   Lymphatics:  Negative cervical, supraclavicular or axillary adenopathy.   Respiratory: lungs were clear bilaterally without wheezing or crackles.   Cardiovascular:  Regular rate and rhythm, S1/S2, without murmur, rub or gallop.  +1 LE edema bilaterally.    GI:  Positive bowel sounds.  Left upper quadrant mass not palpable. Musculoskeletal:  no spinal tenderness of palpation of vertebral spine.   Skin exam was without echymosis, petichae.   Neuro exam was nonfocal. Patient was alert and oriented.  Attention was good.   Language was appropriate.  Mood was normal without depression.  Speech was not pressured.  Thought content was not tangential.     Lab  Results  Component Value Date   WBC 17.9 (H) 05/29/2020   HGB 9.8 (L) 05/29/2020   HCT 30.3 (L) 05/29/2020   PLT 351 05/29/2020   GLUCOSE 141 (H) 05/29/2020   CHOL  12/31/2009    160        ATP III CLASSIFICATION:  <200     mg/dL   Desirable  200-239  mg/dL   Borderline High  >=240    mg/dL   High          TRIG 213 (H) 12/31/2009   HDL 28 (L) 12/31/2009   LDLCALC  12/31/2009    89        Total Cholesterol/HDL:CHD Risk Coronary Heart Disease Risk Table                     Men   Women  1/2 Average Risk   3.4   3.3  Average Risk       5.0   4.4  2 X Average Risk   9.6   7.1  3 X Average Risk  23.4   11.0        Use the calculated Patient Ratio above and the CHD Risk Table to determine the patient's CHD Risk.        ATP III CLASSIFICATION (LDL):  <100     mg/dL   Optimal  100-129  mg/dL   Near or Above                    Optimal  130-159  mg/dL   Borderline  160-189  mg/dL   High  >190     mg/dL   Very High   ALT 34 05/29/2020   AST 29 05/29/2020   NA 139 05/29/2020   K 4.0 05/29/2020   CL 109 05/29/2020   CREATININE 1.09 05/29/2020   BUN 27 (H) 05/29/2020   CO2 24 05/29/2020    CT Biopsy  Result Date: 05/08/2020 CLINICAL DATA:  13 cm left-sided mesenteric abdominal mass. EXAM: CT GUIDED CORE BIOPSY OF MESENTERIC MASS ANESTHESIA/SEDATION: 1.5 mg IV Versed; 75 mcg IV Fentanyl Total Moderate Sedation Time:  18 minutes. The patient's level of consciousness and physiologic status were continuously monitored during the procedure by Radiology nursing. PROCEDURE: The procedure risks, benefits, and alternatives were explained to the patient. Questions regarding the procedure were encouraged and answered. The patient understands and consents to the procedure. A time-out was performed prior to initiating the procedure. CT was performed through the abdomen in a supine position. The left abdominal wall was prepped with chlorhexidine in a sterile fashion, and a sterile drape was  applied covering the operative field. A sterile gown and  sterile gloves were used for the procedure. Local anesthesia was provided with 1% Lidocaine. Under CT guidance, a 17 gauge trocar needle was advanced to the level of a left-sided mesenteric mass. After confirming needle tip position, 4 separate coaxial 18 gauge core biopsy samples were obtained and submitted in saline. Some Gel-Foam pledgets were advanced through the outer needle as the needle was retracted and removed. COMPLICATIONS: None FINDINGS: Large left-sided intra-abdominal mass within the mesentery measures up to 13.3 cm in maximum diameter. Solid tissue was obtained. IMPRESSION: CT-guided core biopsy performed of large left-sided mesenteric mass measuring just over 13 cm in estimated maximal diameter. Electronically Signed   By: Aletta Edouard M.D.   On: 05/08/2020 15:02   ECHOCARDIOGRAM COMPLETE  Result Date: 05/22/2020    ECHOCARDIOGRAM REPORT   Patient Name:   TARL CEPHAS [facility] Date of Exam: 05/22/2020 Medical Rec #:  [id number]        Height:       68.0 in Accession #:    [id number]       Weight:       171.0 lb Date of Birth:  1946/01/16        BSA:          1.912 m Patient Age:    3 years         BP:           116/77 mmHg Patient Gender: M                HR:           80 bpm. Exam Location:  Outpatient Procedure: 2D Echo, Cardiac Doppler and Color Doppler Indications:    Chemotherapy evaluation v87.41 / v58.11  History:        Patient has no prior history of Echocardiogram examinations. TIA                 and Stroke; Risk Factors:Hypertension, Dyslipidemia and GERD.  Sonographer:    Bernadene Person RDCS Referring Phys: [id number] [place]  1. Left ventricular ejection fraction, by estimation, is 60 to 65%. The left ventricle has normal function. The left ventricle has no regional wall motion abnormalities. Left ventricular diastolic parameters are consistent with Grade I diastolic dysfunction (impaired relaxation). The average  left ventricular global longitudinal strain is -17.6 %. The global longitudinal strain is normal.  2. Right ventricular systolic function is normal. The right ventricular size is normal.  3. The mitral valve is normal in structure. No evidence of mitral valve regurgitation. No evidence of mitral stenosis.  4. The aortic valve is normal in structure. Aortic valve regurgitation is not visualized. No aortic stenosis is present.  5. The inferior vena cava is normal in size with greater than 50% respiratory variability, suggesting right atrial pressure of 3 mmHg. FINDINGS  Left Ventricle: Left ventricular ejection fraction, by estimation, is 60 to 65%. The left ventricle has normal function. The left ventricle has no regional wall motion abnormalities. The average left ventricular global longitudinal strain is -17.6 %. The global longitudinal strain is normal. The left ventricular internal cavity size was normal in size. There is no left ventricular hypertrophy. Left ventricular diastolic parameters are consistent with Grade I diastolic dysfunction (impaired relaxation). Right Ventricle: The right ventricular size is normal. No increase in right ventricular wall thickness. Right ventricular systolic function is normal. Left Atrium: Left atrial size was normal in size. Right Atrium: Right atrial size was normal in size. Pericardium: There  is no evidence of pericardial effusion. Mitral Valve: The mitral valve is normal in structure. Normal mobility of the mitral valve leaflets. No evidence of mitral valve regurgitation. No evidence of mitral valve stenosis. Tricuspid Valve: The tricuspid valve is normal in structure. Tricuspid valve regurgitation is not demonstrated. No evidence of tricuspid stenosis. Aortic Valve: The aortic valve is normal in structure. Aortic valve regurgitation is not visualized. No aortic stenosis is present. Pulmonic Valve: The pulmonic valve was normal in structure. Pulmonic valve regurgitation is  trivial. No evidence of pulmonic stenosis. Aorta: The aortic root is normal in size and structure. Venous: The inferior vena cava is normal in size with greater than 50% respiratory variability, suggesting right atrial pressure of 3 mmHg. IAS/Shunts: No atrial level shunt detected by color flow Doppler.  LEFT VENTRICLE PLAX 2D LVIDd:         5.10 cm  Diastology LVIDs:         3.20 cm  LV e' lateral:   9.94 cm/s LV PW:         0.70 cm  LV E/e' lateral: 4.6 LV IVS:        0.70 cm  LV e' medial:    6.85 cm/s LVOT diam:     2.10 cm  LV E/e' medial:  6.6 LV SV:         81 LV SV Index:   43       2D Longitudinal Strain LVOT Area:     3.46 cm 2D Strain GLS Avg:     -17.6 %  RIGHT VENTRICLE RV S prime:     9.79 cm/s TAPSE (M-mode): 1.6 cm LEFT ATRIUM             Index       RIGHT ATRIUM           Index LA diam:        3.70 cm 1.94 cm/m  RA Area:     14.40 cm LA Vol (A2C):   69.5 ml 36.35 ml/m RA Volume:   31.90 ml  16.68 ml/m LA Vol (A4C):   51.0 ml 26.67 ml/m LA Biplane Vol: 59.4 ml 31.07 ml/m  AORTIC VALVE LVOT Vmax:   143.00 cm/s LVOT Vmean:  98.900 cm/s LVOT VTI:    0.235 m  AORTA Ao Root diam: 3.20 cm Ao Asc diam:  3.40 cm MITRAL VALVE MV Area (PHT): 2.22 cm    SHUNTS MV Decel Time: 342 msec    Systemic VTI:  0.24 m MV E velocity: 45.30 cm/s  Systemic Diam: 2.10 cm MV A velocity: 72.50 cm/s MV E/A ratio:  0.62 Candee Furbish MD Electronically signed by Candee Furbish MD Signature Date/Time: 05/22/2020/12:55:40 PM    Final    IR IMAGING GUIDED PORT INSERTION  Result Date: 05/19/2020 INDICATION: History of diffuse large B-cell lymphoma. In need of durable intravenous access for chemotherapy administration EXAM: IMPLANTED PORT A CATH PLACEMENT WITH ULTRASOUND AND FLUOROSCOPIC GUIDANCE COMPARISON:  PET-CT-04/28/2020 MEDICATIONS: Ancef 2 gm IV; The antibiotic was administered within an appropriate time interval prior to skin puncture. ANESTHESIA/SEDATION: Moderate (conscious) sedation was employed during this procedure.  A total of Versed 2 mg and Fentanyl 100 mcg was administered intravenously. Moderate Sedation Time: 25 minutes. The patient's level of consciousness and vital signs were monitored continuously by radiology nursing throughout the procedure under my direct supervision. CONTRAST:  None FLUOROSCOPY TIME:  24 seconds (5 mGy) COMPLICATIONS: None immediate. PROCEDURE: The procedure, risks, benefits, and alternatives were explained  to the patient. Questions regarding the procedure were encouraged and answered. The patient understands and consents to the procedure. The right neck and chest were prepped with chlorhexidine in a sterile fashion, and a sterile drape was applied covering the operative field. Maximum barrier sterile technique with sterile gowns and gloves were used for the procedure. A timeout was performed prior to the initiation of the procedure. Local anesthesia was provided with 1% lidocaine with epinephrine. After creating a small venotomy incision, a micropuncture kit was utilized to access the internal jugular vein. Real-time ultrasound guidance was utilized for vascular access including the acquisition of a permanent ultrasound image documenting patency of the accessed vessel. The microwire was utilized to measure appropriate catheter length. A subcutaneous port pocket was then created along the upper chest wall utilizing a combination of sharp and blunt dissection. The pocket was irrigated with sterile saline. A single lumen "Slim" sized power injectable port was chosen for placement. The 8 Fr catheter was tunneled from the port pocket site to the venotomy incision. The port was placed in the pocket. The external catheter was trimmed to appropriate length. At the venotomy, an 8 Fr peel-away sheath was placed over a guidewire under fluoroscopic guidance. The catheter was then placed through the sheath and the sheath was removed. Final catheter positioning was confirmed and documented with a fluoroscopic  spot radiograph. The port was accessed with a Huber needle, aspirated and flushed with heparinized saline. The venotomy site was closed with an interrupted 4-0 Vicryl suture. The port pocket incision was closed with interrupted 2-0 Vicryl suture. The skin was opposed with a running subcuticular 4-0 Vicryl suture. Dermabond and Steri-strips were applied to both incisions. Dressings were applied. The patient tolerated the procedure well without immediate post procedural complication. FINDINGS: After catheter placement, the tip lies within the superior cavoatrial junction. The catheter aspirates and flushes normally and is ready for immediate use. IMPRESSION: Successful placement of a right internal jugular approach power injectable Port-A-Cath. The catheter is ready for immediate use. Electronically Signed   By: Sandi Mariscal M.D.   On: 05/19/2020 15:59     CT Biopsy  Result Date: 05/08/2020 CLINICAL DATA:  13 cm left-sided mesenteric abdominal mass. EXAM: CT GUIDED CORE BIOPSY OF MESENTERIC MASS ANESTHESIA/SEDATION: 1.5 mg IV Versed; 75 mcg IV Fentanyl Total Moderate Sedation Time:  18 minutes. The patient's level of consciousness and physiologic status were continuously monitored during the procedure by Radiology nursing. PROCEDURE: The procedure risks, benefits, and alternatives were explained to the patient. Questions regarding the procedure were encouraged and answered. The patient understands and consents to the procedure. A time-out was performed prior to initiating the procedure. CT was performed through the abdomen in a supine position. The left abdominal wall was prepped with chlorhexidine in a sterile fashion, and a sterile drape was applied covering the operative field. A sterile gown and sterile gloves were used for the procedure. Local anesthesia was provided with 1% Lidocaine. Under CT guidance, a 17 gauge trocar needle was advanced to the level of a left-sided mesenteric mass. After confirming  needle tip position, 4 separate coaxial 18 gauge core biopsy samples were obtained and submitted in saline. Some Gel-Foam pledgets were advanced through the outer needle as the needle was retracted and removed. COMPLICATIONS: None FINDINGS: Large left-sided intra-abdominal mass within the mesentery measures up to 13.3 cm in maximum diameter. Solid tissue was obtained. IMPRESSION: CT-guided core biopsy performed of large left-sided mesenteric mass measuring just over 13 cm in  estimated maximal diameter. Electronically Signed   By: Aletta Edouard M.D.   On: 05/08/2020 15:02   ECHOCARDIOGRAM COMPLETE  Result Date: 05/22/2020    ECHOCARDIOGRAM REPORT   Patient Name:   Connor Adams [facility] Date of Exam: 05/22/2020 Medical Rec #:  [id number]        Height:       68.0 in Accession #:    [id number]       Weight:       171.0 lb Date of Birth:  Feb 11, 1946        BSA:          1.912 m Patient Age:    68 years         BP:           116/77 mmHg Patient Gender: M                HR:           80 bpm. Exam Location:  Outpatient Procedure: 2D Echo, Cardiac Doppler and Color Doppler Indications:    Chemotherapy evaluation v87.41 / v58.11  History:        Patient has no prior history of Echocardiogram examinations. TIA                 and Stroke; Risk Factors:Hypertension, Dyslipidemia and GERD.  Sonographer:    Bernadene Person RDCS Referring Phys: [id number] [place]  1. Left ventricular ejection fraction, by estimation, is 60 to 65%. The left ventricle has normal function. The left ventricle has no regional wall motion abnormalities. Left ventricular diastolic parameters are consistent with Grade I diastolic dysfunction (impaired relaxation). The average left ventricular global longitudinal strain is -17.6 %. The global longitudinal strain is normal.  2. Right ventricular systolic function is normal. The right ventricular size is normal.  3. The mitral valve is normal in structure. No evidence of mitral valve  regurgitation. No evidence of mitral stenosis.  4. The aortic valve is normal in structure. Aortic valve regurgitation is not visualized. No aortic stenosis is present.  5. The inferior vena cava is normal in size with greater than 50% respiratory variability, suggesting right atrial pressure of 3 mmHg. FINDINGS  Left Ventricle: Left ventricular ejection fraction, by estimation, is 60 to 65%. The left ventricle has normal function. The left ventricle has no regional wall motion abnormalities. The average left ventricular global longitudinal strain is -17.6 %. The global longitudinal strain is normal. The left ventricular internal cavity size was normal in size. There is no left ventricular hypertrophy. Left ventricular diastolic parameters are consistent with Grade I diastolic dysfunction (impaired relaxation). Right Ventricle: The right ventricular size is normal. No increase in right ventricular wall thickness. Right ventricular systolic function is normal. Left Atrium: Left atrial size was normal in size. Right Atrium: Right atrial size was normal in size. Pericardium: There is no evidence of pericardial effusion. Mitral Valve: The mitral valve is normal in structure. Normal mobility of the mitral valve leaflets. No evidence of mitral valve regurgitation. No evidence of mitral valve stenosis. Tricuspid Valve: The tricuspid valve is normal in structure. Tricuspid valve regurgitation is not demonstrated. No evidence of tricuspid stenosis. Aortic Valve: The aortic valve is normal in structure. Aortic valve regurgitation is not visualized. No aortic stenosis is present. Pulmonic Valve: The pulmonic valve was normal in structure. Pulmonic valve regurgitation is trivial. No evidence of pulmonic stenosis. Aorta: The aortic root is normal in size and structure. Venous: The  inferior vena cava is normal in size with greater than 50% respiratory variability, suggesting right atrial pressure of 3 mmHg. IAS/Shunts: No atrial  level shunt detected by color flow Doppler.  LEFT VENTRICLE PLAX 2D LVIDd:         5.10 cm  Diastology LVIDs:         3.20 cm  LV e' lateral:   9.94 cm/s LV PW:         0.70 cm  LV E/e' lateral: 4.6 LV IVS:        0.70 cm  LV e' medial:    6.85 cm/s LVOT diam:     2.10 cm  LV E/e' medial:  6.6 LV SV:         81 LV SV Index:   43       2D Longitudinal Strain LVOT Area:     3.46 cm 2D Strain GLS Avg:     -17.6 %  RIGHT VENTRICLE RV S prime:     9.79 cm/s TAPSE (M-mode): 1.6 cm LEFT ATRIUM             Index       RIGHT ATRIUM           Index LA diam:        3.70 cm 1.94 cm/m  RA Area:     14.40 cm LA Vol (A2C):   69.5 ml 36.35 ml/m RA Volume:   31.90 ml  16.68 ml/m LA Vol (A4C):   51.0 ml 26.67 ml/m LA Biplane Vol: 59.4 ml 31.07 ml/m  AORTIC VALVE LVOT Vmax:   143.00 cm/s LVOT Vmean:  98.900 cm/s LVOT VTI:    0.235 m  AORTA Ao Root diam: 3.20 cm Ao Asc diam:  3.40 cm MITRAL VALVE MV Area (PHT): 2.22 cm    SHUNTS MV Decel Time: 342 msec    Systemic VTI:  0.24 m MV E velocity: 45.30 cm/s  Systemic Diam: 2.10 cm MV A velocity: 72.50 cm/s MV E/A ratio:  0.62 Candee Furbish MD Electronically signed by Candee Furbish MD Signature Date/Time: 05/22/2020/12:55:40 PM    Final    IR IMAGING GUIDED PORT INSERTION  Result Date: 05/19/2020 INDICATION: History of diffuse large B-cell lymphoma. In need of durable intravenous access for chemotherapy administration EXAM: IMPLANTED PORT A CATH PLACEMENT WITH ULTRASOUND AND FLUOROSCOPIC GUIDANCE COMPARISON:  PET-CT-04/28/2020 MEDICATIONS: Ancef 2 gm IV; The antibiotic was administered within an appropriate time interval prior to skin puncture. ANESTHESIA/SEDATION: Moderate (conscious) sedation was employed during this procedure. A total of Versed 2 mg and Fentanyl 100 mcg was administered intravenously. Moderate Sedation Time: 25 minutes. The patient's level of consciousness and vital signs were monitored continuously by radiology nursing throughout the procedure under my direct  supervision. CONTRAST:  None FLUOROSCOPY TIME:  24 seconds (5 mGy) COMPLICATIONS: None immediate. PROCEDURE: The procedure, risks, benefits, and alternatives were explained to the patient. Questions regarding the procedure were encouraged and answered. The patient understands and consents to the procedure. The right neck and chest were prepped with chlorhexidine in a sterile fashion, and a sterile drape was applied covering the operative field. Maximum barrier sterile technique with sterile gowns and gloves were used for the procedure. A timeout was performed prior to the initiation of the procedure. Local anesthesia was provided with 1% lidocaine with epinephrine. After creating a small venotomy incision, a micropuncture kit was utilized to access the internal jugular vein. Real-time ultrasound guidance was utilized for vascular access including the acquisition of  a permanent ultrasound image documenting patency of the accessed vessel. The microwire was utilized to measure appropriate catheter length. A subcutaneous port pocket was then created along the upper chest wall utilizing a combination of sharp and blunt dissection. The pocket was irrigated with sterile saline. A single lumen "Slim" sized power injectable port was chosen for placement. The 8 Fr catheter was tunneled from the port pocket site to the venotomy incision. The port was placed in the pocket. The external catheter was trimmed to appropriate length. At the venotomy, an 8 Fr peel-away sheath was placed over a guidewire under fluoroscopic guidance. The catheter was then placed through the sheath and the sheath was removed. Final catheter positioning was confirmed and documented with a fluoroscopic spot radiograph. The port was accessed with a Huber needle, aspirated and flushed with heparinized saline. The venotomy site was closed with an interrupted 4-0 Vicryl suture. The port pocket incision was closed with interrupted 2-0 Vicryl suture. The skin  was opposed with a running subcuticular 4-0 Vicryl suture. Dermabond and Steri-strips were applied to both incisions. Dressings were applied. The patient tolerated the procedure well without immediate post procedural complication. FINDINGS: After catheter placement, the tip lies within the superior cavoatrial junction. The catheter aspirates and flushes normally and is ready for immediate use. IMPRESSION: Successful placement of a right internal jugular approach power injectable Port-A-Cath. The catheter is ready for immediate use. Electronically Signed   By: Sandi Mariscal M.D.   On: 05/19/2020 15:59    Assessment and Plan:  Connor Adams 74 y.o. male with medical history significant for Stage I DLBCL who presents for a follow up visit.  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    Today he is Cycle 4 Day 3 of treatment.  He is tolerating his chemotherapy well so far with no nausea or vomiting.  Bowels are moving and he has a good appetite.  There are no barriers to proceeding with treatment.      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 Stage I bulky disease.   --Continue EPOCH-R. Today is Cycle 4 Day 3 --Adverse effects have been reviewed including, but not limited to, alopecia, myelosuppression, peripheral neuropathy, mucositis, liver/renal dysfunction, and possible infusion reaction associated with Rituxan. He agrees to proceed. --PAC in place and ready for use. --Echocardiogram from 05/22/2020 shows LVEF of 60-65%  --CBC adequate to proceed with treatment today.  Leukocytosis likely related to steroids.  Anemia slightly  worse this morning (Hgb 8.1).  --Will monitor closely with CBC with diff, CMET, LDH, and uric acid --continue Allopurinol 300 mg daily.  May consider stopping allopurinol if uric acid remains stable. --Lovenox for DVT prophylaxis. --Continue home medications for HTN and hyperlipidemia.  --Continue PRN antiemetics. --Continue MiraLAX and Senokot.  The patient will return to the cancer center on 08/04/2020 for Rituxan and Neulasta and will have labs and a follow-up visit on 08/07/2020.  [name], MD Department of Hematology/Oncology [place] [facility] Phone: [phone] Pager: [phone] Email: [email or web address]

## 2020-07-31 NOTE — Progress Notes (Signed)
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   1) 04/17/2020: CT A/P showed dominant nodal mass(11.5 x 10.9 cm)within the jejunal mesentery, highly suspicious for lymphoma. Additionally there are left lower lobe pleural-based pulmonary nodules 2) 04/24/2020: establish care with Dr. Lorenso Courier 3) 04/28/2020:  PET CT scan shows Large solid left mesenteric mass 13.0 cm with maximum SUV of 21.2, [place] 5 with local hypermetabolic porta hepatis and retroperitoneal lymph nodes. Constitutes bulky Stage I disease.   Reason for Admission: double hit lymphoma, Cycle #4 EPOCH-R  HPI: Mr. Connor Adams is a 74 year old male with medical history significant for double hit DLBCL. He presents for Cycle 4 of R-EPOCH chemotherapy. Risks and benefits have been discussed and he has agreed to proceed  Interval History: Continues to tolerate his chemotherapy well overall.  Continues to ambulate in the hallway without any difficulty.  He denies chest pain, shortness of breath.  Reports some soreness in his mouth which started last evening.  Denies nausea and vomiting.  Bowels move last evening.  Remains on MiraLAX and Senokot.      Diagnosis Date  . Arthritis   . Chronic kidney disease (CKD), stage III (moderate)   . COVID-19   . GERD (gastroesophageal reflux disease)    occ  . Hemorrhoids   . History of ETT 3/05   low risk  . History of hiatal hernia   . History of kidney stones   . HLD (hyperlipidemia)   . HTN (hypertension)   . Left inguinal hernia   . Melanoma ([place])    excied  . 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 Stroke [facility]) 2011   tia   . TIA (transient ischemic attack) 4/11   associated w slurred speecha ndmild facial droop. lasted for about 10  minutes. Had MRI, etc with [facility] that per his report was ok (dr. Stephannie Li). Echo (4/11): EF 55-60%, no regional WMAs, normal diastolic function, mild LAE, no source of embolus  Past Surgical History:  Procedure Laterality Date  . BMI 30.2 muscular  12/08/04  . curretage actinic keratosis R ear  01/20/05  . ETT low prob of CAD  11/21/03  . excision melanoma in situ back  01/20/05  . hemorrhoids sclerosed at colonoscopy  12/19/05  . IR IMAGING GUIDED PORT INSERTION  05/19/2020  . LAPAROSCOPIC INGUINAL HERNIA REPAIR Left 12/20/1995  . LUMBAR DISC SURGERY    . NERVE REPAIR     back of neck  . retinal laser surgery Left 09/20/1986  . SEPTOPLASTY  11/19/98  . TOTAL KNEE ARTHROPLASTY Left 12/13/2016   Procedure: TOTAL KNEE ARTHROPLASTY WITH RIGHT KNEE CORTISONE INJECTION;  Surgeon: Frederik Pear, MD;  Location: [facility];  Service: Orthopedics;  Laterality: Left;  . uric acid 6.9  12/22/04  . x-ray l great toe  07/21/00       [facility] Past Surgical History:  Procedure Laterality Date  . BMI 30.2 muscular  12/08/04  . curretage actinic keratosis R ear  01/20/05  . ETT low prob of CAD  11/21/03  . excision melanoma in situ back  01/20/05  . hemorrhoids sclerosed at colonoscopy  12/19/05  . IR IMAGING GUIDED PORT INSERTION  05/19/2020  . LAPAROSCOPIC INGUINAL HERNIA REPAIR Left 12/20/1995  . LUMBAR DISC SURGERY    .  NERVE REPAIR     back of neck  . retinal laser surgery Left 09/20/1986  . SEPTOPLASTY  11/19/98  . TOTAL KNEE ARTHROPLASTY Left 12/13/2016   Procedure: TOTAL KNEE ARTHROPLASTY WITH RIGHT KNEE CORTISONE INJECTION;  Surgeon: Frederik Pear, MD;  Location: [facility];  Service: Orthopedics;  Laterality: Left;  . uric acid 6.9  12/22/04  . x-ray l great toe  07/21/00  quency Provider Last Rate Last Admin  . 0.9 %  sodium chloride infusion   Intravenous Continuous [name] IV, MD 20 mL/hr at 05/29/20 0400 Rate Verify at 05/29/20 0400  . allopurinol (ZYLOPRIM) tablet 300 mg  300 mg Oral Daily [name]  R, NP   300 mg at 05/29/20 1038  . aspirin tablet 325 mg  325 mg Oral Q4H PRN Maryanna Shape, NP      . Chlorhexidine Gluconate Cloth 2 % PADS 6 each  6 each Topical Daily Maryanna Shape, NP   6 each at 05/29/20 1038  . Cold Pack 1 packet  1 packet Topical Once PRN [name] IV, MD      . DOXOrubicin (ADRIAMYCIN) 20 mg, etoposide (VEPESID) 96 mg, vinCRIStine (ONCOVIN) 0.8 mg in sodium chloride 0.9 % 1,000 mL chemo infusion   Intravenous Once Orson Slick, MD 51 mL/hr at 05/28/20 1422 New Bag at 05/28/20 1422  . DOXOrubicin (ADRIAMYCIN) 20 mg, etoposide (VEPESID) 96 mg, vinCRIStine (ONCOVIN) 0.8 mg in sodium chloride 0.9 % 1,000 mL chemo infusion   Intravenous Once Narda Rutherford T IV, MD      . enoxaparin (LOVENOX) injection 40 mg  40 mg Subcutaneous Q24H [name] R, NP   40 mg at 05/28/20 1116  . Hot Pack 1 packet  1 packet Topical Once PRN [name] IV, MD      . hydrocortisone (ANUSOL-HC) 2.5 % rectal cream 1 application  1 application Rectal Daily PRN Maryanna Shape, NP      . losartan (COZAAR) tablet 25 mg  25 mg Oral Daily [name] R, NP   25 mg at 05/29/20 1038  . multivitamin with minerals tablet 1 tablet  1 tablet Oral Daily Maryanna Shape, NP   1 tablet at 05/29/20 1038  . ondansetron (ZOFRAN-ODT) disintegrating tablet 8 mg  8 mg Oral Q8H PRN Alaycia Eardley R, NP      . pantoprazole (PROTONIX) EC tablet 40 mg  40 mg Oral BID [name] R, NP   40 mg at 05/29/20 1038  . polyethylene glycol (MIRALAX / GLYCOLAX) packet 17 g  17 g Oral Daily PRN Narda Rutherford T IV, MD      . predniSONE (DELTASONE) tablet 120 mg  120 mg Oral Q breakfast [name] IV, MD   120 mg at 05/29/20 0819  . prochlorperazine (COMPAZINE) tablet 10 mg  10 mg Oral Q6H PRN [name] R, NP      . rosuvastatin (CRESTOR) tablet 5 mg  5 mg Oral QPM Brynnan Rodenbaugh R, NP   5 mg at 05/28/20 1744  . senna-docusate (Senokot-S) tablet 2 tablet  2 tablet Oral BID Orson Slick, MD   2 tablet at 05/29/20 1038  . sodium bicarbonate/sodium chloride mouthwash 1078m   Mouth Rinse PRN Rendy Lazard R, NP      . sodium chloride flush (NS) 0.9 % injection 10-40 mL  10-40 mL Intracatheter Q12H CMaryanna Shape NP   10 mL at 05/28/20 2129  .  sodium chloride flush (NS) 0.9 % injection 10-40 mL  10-40 mL Intracatheter PRN Nyra Anspaugh R, NP      . sucralfate (CARAFATE) 1 GM/10ML suspension 1 g  1 g Oral TID WC & HS Manuel Lawhead, Roselie Awkward, NP   1 g at 05/29/20 6222       Family History  Problem Relation Age of Onset  . Liver cancer Sister   . Heart failure Brother 44       CABGx4  . Heart attack Mother 14  . Uterine cancer Mother   . Heart attack Father 44  . Asthma Son   . Diabetes Brother   . Prostate cancer Brother   Relation Age of Onset  . Liver cancer Sister   . Heart failure Brother 44       CABGx4  . Heart attack Mother 62  . Uterine cancer Mother   . Heart attack Father 55  . Asthma Son   . Diabetes Brother   . Prostate cancer Brother      Socioeconomic History  . Marital status: Married    Spouse name: [name]  . Number of children: 2  . Years of education: 7  . Highest education level: Not on file  Occupational History  . Occupation: Systems developer: [employer]  Tobacco Use  . Smoking status: Never Smoker  . Smokeless tobacco: Never Used  Vaping Use  . Vaping Use: Never used  Substance and Sexual Activity  . Alcohol use: No  . Drug use: No  . Sexual activity: Not on file  Other Topics Concern  . Not on file  Social History Narrative   Married to [employer] at [employer].   Son, [name] married   Son, [name], Married.    Caffeine use: 1 cup coffee per day   Social Determinants of Health   Financial Resource Strain:   . Difficulty of Paying Living Expenses: Not on file  Food Insecurity:   . Worried About Charity fundraiser in the Last Year: Not on file  . Ran Out of Food in the Last Year:  Not on file  Transportation Needs:   . Lack of Transportation (Medical): Not on file  . Lack of Transportation (Non-Medical): Not on file  Physical Activity:   . Days of Exercise per Week: Not on file  . Minutes of Exercise per Session: Not on file  Stress:   . Feeling of Stress : Not on file  Social Connections:   . Frequency of Communication with Friends and Family: Not on file  . Frequency of Social Gatherings with Friends and Family: Not on file  . Attends Religious Services: Not on file  . Active Member of Clubs or Organizations: Not on file  . Attends Archivist Meetings: Not on file  . Marital Status: Not on file  Intimate Partner Violence:   . Fear of Current or Ex-Partner: Not on file  . Emotionally Abused: Not on file  . Physically Abused: Not on file  . Sexually Abused: Not on file  :  Review of Systems: A comprehensive 14 point review of systems was negative except as noted in the HPI.  Exam: Patient Vitals for the past 24 hrs:  BP Temp Temp src Pulse Resp SpO2  05/29/20 0600 107/60 98 F (36.7 C) Oral (!) 56 20 97 %  05/28/20 2222 125/68 (!) 97.4 F (36.3 C) Oral 60 20 98 %  05/28/20 1503 113/75 (!) 97.4 F (36.3 C) Oral (!) 53 18 96 %    General:  well-nourished in no acute distress.   Eyes:  no scleral icterus.   ENT:  There were no oropharyngeal lesions.   Neck was without thyromegaly.   Lymphatics:  Negative cervical, supraclavicular or axillary adenopathy.   Respiratory: lungs were clear bilaterally without wheezing or crackles.   Cardiovascular:  Regular rate and rhythm, S1/S2, without murmur, rub or gallop.  There was no pedal edema.   GI:  Positive bowel sounds.  Left upper quadrant mass not palpable. Musculoskeletal:  no spinal tenderness of palpation of vertebral spine.   Skin exam was without echymosis, petichae.   Neuro exam was nonfocal. Patient was alert and oriented.  Attention was good.   Language was appropriate.  Mood was normal  without depression.  Speech was not pressured.  Thought content was not tangential.     Lab Results  Component Value Date   WBC 17.9 (H) 05/29/2020   HGB 9.8 (L) 05/29/2020   HCT 30.3 (L) 05/29/2020   PLT 351 05/29/2020   GLUCOSE 141 (H) 05/29/2020   CHOL  12/31/2009    160        ATP III CLASSIFICATION:  <200     mg/dL   Desirable  200-239  mg/dL   Borderline High  >=240    mg/dL   High          TRIG 213 (H) 12/31/2009   HDL 28 (L) 12/31/2009   LDLCALC  12/31/2009    89        Total Cholesterol/HDL:CHD Risk Coronary Heart Disease Risk Table                     Men   Women  1/2 Average Risk   3.4   3.3  Average Risk       5.0   4.4  2 X Average Risk   9.6   7.1  3 X Average Risk  23.4   11.0        Use the calculated Patient Ratio above and the CHD Risk Table to determine the patient's CHD Risk.        ATP III CLASSIFICATION (LDL):  <100     mg/dL   Optimal  100-129  mg/dL   Near or Above                    Optimal  130-159  mg/dL   Borderline  160-189  mg/dL   High  >190     mg/dL   Very High   ALT 34 05/29/2020   AST 29 05/29/2020   NA 139 05/29/2020   K 4.0 05/29/2020   CL 109 05/29/2020   CREATININE 1.09 05/29/2020   BUN 27 (H) 05/29/2020   CO2 24 05/29/2020    CT Biopsy  Result Date: 05/08/2020 CLINICAL DATA:  13 cm left-sided mesenteric abdominal mass. EXAM: CT GUIDED CORE BIOPSY OF MESENTERIC MASS ANESTHESIA/SEDATION: 1.5 mg IV Versed; 75 mcg IV Fentanyl Total Moderate Sedation Time:  18 minutes. The patient's level of consciousness and physiologic status were continuously monitored during the procedure by Radiology nursing. PROCEDURE: The procedure risks, benefits, and alternatives were explained to the patient. Questions regarding the procedure were encouraged and answered. The patient understands and consents to the procedure. A time-out was performed prior to initiating the procedure. CT was performed through the abdomen in a supine position. The  left  abdominal wall was prepped with chlorhexidine in a sterile fashion, and a sterile drape was applied covering the operative field. A sterile gown and sterile gloves were used for the procedure. Local anesthesia was provided with 1% Lidocaine. Under CT guidance, a 17 gauge trocar needle was advanced to the level of a left-sided mesenteric mass. After confirming needle tip position, 4 separate coaxial 18 gauge core biopsy samples were obtained and submitted in saline. Some Gel-Foam pledgets were advanced through the outer needle as the needle was retracted and removed. COMPLICATIONS: None FINDINGS: Large left-sided intra-abdominal mass within the mesentery measures up to 13.3 cm in maximum diameter. Solid tissue was obtained. IMPRESSION: CT-guided core biopsy performed of large left-sided mesenteric mass measuring just over 13 cm in estimated maximal diameter. Electronically Signed   By: Aletta Edouard M.D.   On: 05/08/2020 15:02   ECHOCARDIOGRAM COMPLETE  Result Date: 05/22/2020    ECHOCARDIOGRAM REPORT   Patient Name:   Connor Adams [facility] Date of Exam: 05/22/2020 Medical Rec #:  [id number]        Height:       68.0 in Accession #:    [id number]       Weight:       171.0 lb Date of Birth:  1946/06/04        BSA:          1.912 m Patient Age:    26 years         BP:           116/77 mmHg Patient Gender: M                HR:           80 bpm. Exam Location:  Outpatient Procedure: 2D Echo, Cardiac Doppler and Color Doppler Indications:    Chemotherapy evaluation v87.41 / v58.11  History:        Patient has no prior history of Echocardiogram examinations. TIA                 and Stroke; Risk Factors:Hypertension, Dyslipidemia and GERD.  Sonographer:    Bernadene Person RDCS Referring Phys: [id number] [place]  1. Left ventricular ejection fraction, by estimation, is 60 to 65%. The left ventricle has normal function. The left ventricle has no regional wall motion abnormalities. Left ventricular diastolic  parameters are consistent with Grade I diastolic dysfunction (impaired relaxation). The average left ventricular global longitudinal strain is -17.6 %. The global longitudinal strain is normal.  2. Right ventricular systolic function is normal. The right ventricular size is normal.  3. The mitral valve is normal in structure. No evidence of mitral valve regurgitation. No evidence of mitral stenosis.  4. The aortic valve is normal in structure. Aortic valve regurgitation is not visualized. No aortic stenosis is present.  5. The inferior vena cava is normal in size with greater than 50% respiratory variability, suggesting right atrial pressure of 3 mmHg. FINDINGS  Left Ventricle: Left ventricular ejection fraction, by estimation, is 60 to 65%. The left ventricle has normal function. The left ventricle has no regional wall motion abnormalities. The average left ventricular global longitudinal strain is -17.6 %. The global longitudinal strain is normal. The left ventricular internal cavity size was normal in size. There is no left ventricular hypertrophy. Left ventricular diastolic parameters are consistent with Grade I diastolic dysfunction (impaired relaxation). Right Ventricle: The right ventricular size is normal. No increase in right ventricular wall thickness.  Right ventricular systolic function is normal. Left Atrium: Left atrial size was normal in size. Right Atrium: Right atrial size was normal in size. Pericardium: There is no evidence of pericardial effusion. Mitral Valve: The mitral valve is normal in structure. Normal mobility of the mitral valve leaflets. No evidence of mitral valve regurgitation. No evidence of mitral valve stenosis. Tricuspid Valve: The tricuspid valve is normal in structure. Tricuspid valve regurgitation is not demonstrated. No evidence of tricuspid stenosis. Aortic Valve: The aortic valve is normal in structure. Aortic valve regurgitation is not visualized. No aortic stenosis is  present. Pulmonic Valve: The pulmonic valve was normal in structure. Pulmonic valve regurgitation is trivial. No evidence of pulmonic stenosis. Aorta: The aortic root is normal in size and structure. Venous: The inferior vena cava is normal in size with greater than 50% respiratory variability, suggesting right atrial pressure of 3 mmHg. IAS/Shunts: No atrial level shunt detected by color flow Doppler.  LEFT VENTRICLE PLAX 2D LVIDd:         5.10 cm  Diastology LVIDs:         3.20 cm  LV e' lateral:   9.94 cm/s LV PW:         0.70 cm  LV E/e' lateral: 4.6 LV IVS:        0.70 cm  LV e' medial:    6.85 cm/s LVOT diam:     2.10 cm  LV E/e' medial:  6.6 LV SV:         81 LV SV Index:   43       2D Longitudinal Strain LVOT Area:     3.46 cm 2D Strain GLS Avg:     -17.6 %  RIGHT VENTRICLE RV S prime:     9.79 cm/s TAPSE (M-mode): 1.6 cm LEFT ATRIUM             Index       RIGHT ATRIUM           Index LA diam:        3.70 cm 1.94 cm/m  RA Area:     14.40 cm LA Vol (A2C):   69.5 ml 36.35 ml/m RA Volume:   31.90 ml  16.68 ml/m LA Vol (A4C):   51.0 ml 26.67 ml/m LA Biplane Vol: 59.4 ml 31.07 ml/m  AORTIC VALVE LVOT Vmax:   143.00 cm/s LVOT Vmean:  98.900 cm/s LVOT VTI:    0.235 m  AORTA Ao Root diam: 3.20 cm Ao Asc diam:  3.40 cm MITRAL VALVE MV Area (PHT): 2.22 cm    SHUNTS MV Decel Time: 342 msec    Systemic VTI:  0.24 m MV E velocity: 45.30 cm/s  Systemic Diam: 2.10 cm MV A velocity: 72.50 cm/s MV E/A ratio:  0.62 Candee Furbish MD Electronically signed by Candee Furbish MD Signature Date/Time: 05/22/2020/12:55:40 PM    Final    IR IMAGING GUIDED PORT INSERTION  Result Date: 05/19/2020 INDICATION: History of diffuse large B-cell lymphoma. In need of durable intravenous access for chemotherapy administration EXAM: IMPLANTED PORT A CATH PLACEMENT WITH ULTRASOUND AND FLUOROSCOPIC GUIDANCE COMPARISON:  PET-CT-04/28/2020 MEDICATIONS: Ancef 2 gm IV; The antibiotic was administered within an appropriate time interval prior to  skin puncture. ANESTHESIA/SEDATION: Moderate (conscious) sedation was employed during this procedure. A total of Versed 2 mg and Fentanyl 100 mcg was administered intravenously. Moderate Sedation Time: 25 minutes. The patient's level of consciousness and vital signs were monitored continuously by radiology nursing throughout the procedure  under my direct supervision. CONTRAST:  None FLUOROSCOPY TIME:  24 seconds (5 mGy) COMPLICATIONS: None immediate. PROCEDURE: The procedure, risks, benefits, and alternatives were explained to the patient. Questions regarding the procedure were encouraged and answered. The patient understands and consents to the procedure. The right neck and chest were prepped with chlorhexidine in a sterile fashion, and a sterile drape was applied covering the operative field. Maximum barrier sterile technique with sterile gowns and gloves were used for the procedure. A timeout was performed prior to the initiation of the procedure. Local anesthesia was provided with 1% lidocaine with epinephrine. After creating a small venotomy incision, a micropuncture kit was utilized to access the internal jugular vein. Real-time ultrasound guidance was utilized for vascular access including the acquisition of a permanent ultrasound image documenting patency of the accessed vessel. The microwire was utilized to measure appropriate catheter length. A subcutaneous port pocket was then created along the upper chest wall utilizing a combination of sharp and blunt dissection. The pocket was irrigated with sterile saline. A single lumen "Slim" sized power injectable port was chosen for placement. The 8 Fr catheter was tunneled from the port pocket site to the venotomy incision. The port was placed in the pocket. The external catheter was trimmed to appropriate length. At the venotomy, an 8 Fr peel-away sheath was placed over a guidewire under fluoroscopic guidance. The catheter was then placed through the sheath and  the sheath was removed. Final catheter positioning was confirmed and documented with a fluoroscopic spot radiograph. The port was accessed with a Huber needle, aspirated and flushed with heparinized saline. The venotomy site was closed with an interrupted 4-0 Vicryl suture. The port pocket incision was closed with interrupted 2-0 Vicryl suture. The skin was opposed with a running subcuticular 4-0 Vicryl suture. Dermabond and Steri-strips were applied to both incisions. Dressings were applied. The patient tolerated the procedure well without immediate post procedural complication. FINDINGS: After catheter placement, the tip lies within the superior cavoatrial junction. The catheter aspirates and flushes normally and is ready for immediate use. IMPRESSION: Successful placement of a right internal jugular approach power injectable Port-A-Cath. The catheter is ready for immediate use. Electronically Signed   By: Sandi Mariscal M.D.   On: 05/19/2020 15:59     CT Biopsy  Result Date: 05/08/2020 CLINICAL DATA:  13 cm left-sided mesenteric abdominal mass. EXAM: CT GUIDED CORE BIOPSY OF MESENTERIC MASS ANESTHESIA/SEDATION: 1.5 mg IV Versed; 75 mcg IV Fentanyl Total Moderate Sedation Time:  18 minutes. The patient's level of consciousness and physiologic status were continuously monitored during the procedure by Radiology nursing. PROCEDURE: The procedure risks, benefits, and alternatives were explained to the patient. Questions regarding the procedure were encouraged and answered. The patient understands and consents to the procedure. A time-out was performed prior to initiating the procedure. CT was performed through the abdomen in a supine position. The left abdominal wall was prepped with chlorhexidine in a sterile fashion, and a sterile drape was applied covering the operative field. A sterile gown and sterile gloves were used for the procedure. Local anesthesia was provided with 1% Lidocaine. Under CT guidance, a 17  gauge trocar needle was advanced to the level of a left-sided mesenteric mass. After confirming needle tip position, 4 separate coaxial 18 gauge core biopsy samples were obtained and submitted in saline. Some Gel-Foam pledgets were advanced through the outer needle as the needle was retracted and removed. COMPLICATIONS: None FINDINGS: Large left-sided intra-abdominal mass within the mesentery measures up  to 13.3 cm in maximum diameter. Solid tissue was obtained. IMPRESSION: CT-guided core biopsy performed of large left-sided mesenteric mass measuring just over 13 cm in estimated maximal diameter. Electronically Signed   By: Aletta Edouard M.D.   On: 05/08/2020 15:02   ECHOCARDIOGRAM COMPLETE  Result Date: 05/22/2020    ECHOCARDIOGRAM REPORT   Patient Name:   Connor Adams [facility] Date of Exam: 05/22/2020 Medical Rec #:  [id number]        Height:       68.0 in Accession #:    [id number]       Weight:       171.0 lb Date of Birth:  [birth year]        BSA:          1.912 m Patient Age:    17 years         BP:           116/77 mmHg Patient Gender: M                HR:           80 bpm. Exam Location:  Outpatient Procedure: 2D Echo, Cardiac Doppler and Color Doppler Indications:    Chemotherapy evaluation v87.41 / v58.11  History:        Patient has no prior history of Echocardiogram examinations. TIA                 and Stroke; Risk Factors:Hypertension, Dyslipidemia and GERD.  Sonographer:    Bernadene Person RDCS Referring Phys: [id number] [place]  1. Left ventricular ejection fraction, by estimation, is 60 to 65%. The left ventricle has normal function. The left ventricle has no regional wall motion abnormalities. Left ventricular diastolic parameters are consistent with Grade I diastolic dysfunction (impaired relaxation). The average left ventricular global longitudinal strain is -17.6 %. The global longitudinal strain is normal.  2. Right ventricular systolic function is normal. The right ventricular  size is normal.  3. The mitral valve is normal in structure. No evidence of mitral valve regurgitation. No evidence of mitral stenosis.  4. The aortic valve is normal in structure. Aortic valve regurgitation is not visualized. No aortic stenosis is present.  5. The inferior vena cava is normal in size with greater than 50% respiratory variability, suggesting right atrial pressure of 3 mmHg. FINDINGS  Left Ventricle: Left ventricular ejection fraction, by estimation, is 60 to 65%. The left ventricle has normal function. The left ventricle has no regional wall motion abnormalities. The average left ventricular global longitudinal strain is -17.6 %. The global longitudinal strain is normal. The left ventricular internal cavity size was normal in size. There is no left ventricular hypertrophy. Left ventricular diastolic parameters are consistent with Grade I diastolic dysfunction (impaired relaxation). Right Ventricle: The right ventricular size is normal. No increase in right ventricular wall thickness. Right ventricular systolic function is normal. Left Atrium: Left atrial size was normal in size. Right Atrium: Right atrial size was normal in size. Pericardium: There is no evidence of pericardial effusion. Mitral Valve: The mitral valve is normal in structure. Normal mobility of the mitral valve leaflets. No evidence of mitral valve regurgitation. No evidence of mitral valve stenosis. Tricuspid Valve: The tricuspid valve is normal in structure. Tricuspid valve regurgitation is not demonstrated. No evidence of tricuspid stenosis. Aortic Valve: The aortic valve is normal in structure. Aortic valve regurgitation is not visualized. No aortic stenosis is present. Pulmonic Valve: The pulmonic valve  was normal in structure. Pulmonic valve regurgitation is trivial. No evidence of pulmonic stenosis. Aorta: The aortic root is normal in size and structure. Venous: The inferior vena cava is normal in size with greater than 50%  respiratory variability, suggesting right atrial pressure of 3 mmHg. IAS/Shunts: No atrial level shunt detected by color flow Doppler.  LEFT VENTRICLE PLAX 2D LVIDd:         5.10 cm  Diastology LVIDs:         3.20 cm  LV e' lateral:   9.94 cm/s LV PW:         0.70 cm  LV E/e' lateral: 4.6 LV IVS:        0.70 cm  LV e' medial:    6.85 cm/s LVOT diam:     2.10 cm  LV E/e' medial:  6.6 LV SV:         81 LV SV Index:   43       2D Longitudinal Strain LVOT Area:     3.46 cm 2D Strain GLS Avg:     -17.6 %  RIGHT VENTRICLE RV S prime:     9.79 cm/s TAPSE (M-mode): 1.6 cm LEFT ATRIUM             Index       RIGHT ATRIUM           Index LA diam:        3.70 cm 1.94 cm/m  RA Area:     14.40 cm LA Vol (A2C):   69.5 ml 36.35 ml/m RA Volume:   31.90 ml  16.68 ml/m LA Vol (A4C):   51.0 ml 26.67 ml/m LA Biplane Vol: 59.4 ml 31.07 ml/m  AORTIC VALVE LVOT Vmax:   143.00 cm/s LVOT Vmean:  98.900 cm/s LVOT VTI:    0.235 m  AORTA Ao Root diam: 3.20 cm Ao Asc diam:  3.40 cm MITRAL VALVE MV Area (PHT): 2.22 cm    SHUNTS MV Decel Time: 342 msec    Systemic VTI:  0.24 m MV E velocity: 45.30 cm/s  Systemic Diam: 2.10 cm MV A velocity: 72.50 cm/s MV E/A ratio:  0.62 Candee Furbish MD Electronically signed by Candee Furbish MD Signature Date/Time: 05/22/2020/12:55:40 PM    Final    IR IMAGING GUIDED PORT INSERTION  Result Date: 05/19/2020 INDICATION: History of diffuse large B-cell lymphoma. In need of durable intravenous access for chemotherapy administration EXAM: IMPLANTED PORT A CATH PLACEMENT WITH ULTRASOUND AND FLUOROSCOPIC GUIDANCE COMPARISON:  PET-CT-04/28/2020 MEDICATIONS: Ancef 2 gm IV; The antibiotic was administered within an appropriate time interval prior to skin puncture. ANESTHESIA/SEDATION: Moderate (conscious) sedation was employed during this procedure. A total of Versed 2 mg and Fentanyl 100 mcg was administered intravenously. Moderate Sedation Time: 25 minutes. The patient's level of consciousness and vital signs were  monitored continuously by radiology nursing throughout the procedure under my direct supervision. CONTRAST:  None FLUOROSCOPY TIME:  24 seconds (5 mGy) COMPLICATIONS: None immediate. PROCEDURE: The procedure, risks, benefits, and alternatives were explained to the patient. Questions regarding the procedure were encouraged and answered. The patient understands and consents to the procedure. The right neck and chest were prepped with chlorhexidine in a sterile fashion, and a sterile drape was applied covering the operative field. Maximum barrier sterile technique with sterile gowns and gloves were used for the procedure. A timeout was performed prior to the initiation of the procedure. Local anesthesia was provided with 1% lidocaine with epinephrine. After creating a  small venotomy incision, a micropuncture kit was utilized to access the internal jugular vein. Real-time ultrasound guidance was utilized for vascular access including the acquisition of a permanent ultrasound image documenting patency of the accessed vessel. The microwire was utilized to measure appropriate catheter length. A subcutaneous port pocket was then created along the upper chest wall utilizing a combination of sharp and blunt dissection. The pocket was irrigated with sterile saline. A single lumen "Slim" sized power injectable port was chosen for placement. The 8 Fr catheter was tunneled from the port pocket site to the venotomy incision. The port was placed in the pocket. The external catheter was trimmed to appropriate length. At the venotomy, an 8 Fr peel-away sheath was placed over a guidewire under fluoroscopic guidance. The catheter was then placed through the sheath and the sheath was removed. Final catheter positioning was confirmed and documented with a fluoroscopic spot radiograph. The port was accessed with a Huber needle, aspirated and flushed with heparinized saline. The venotomy site was closed with an interrupted 4-0 Vicryl  suture. The port pocket incision was closed with interrupted 2-0 Vicryl suture. The skin was opposed with a running subcuticular 4-0 Vicryl suture. Dermabond and Steri-strips were applied to both incisions. Dressings were applied. The patient tolerated the procedure well without immediate post procedural complication. FINDINGS: After catheter placement, the tip lies within the superior cavoatrial junction. The catheter aspirates and flushes normally and is ready for immediate use. IMPRESSION: Successful placement of a right internal jugular approach power injectable Port-A-Cath. The catheter is ready for immediate use. Electronically Signed   By: Sandi Mariscal M.D.   On: 05/19/2020 15:59    Assessment and Plan:  RAESHAWN VO 74 y.o. male with medical history significant for Stage I DLBCL who presents for a follow up visit.  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  He is currently receiving cycle #2 of EPOCH-R starting on 06/16/2020.  Today he is Cycle 4 Day 4 of treatment.  He is tolerating his chemotherapy well so far with no nausea or vomiting.  Bowels are moving and he has a good appetite.  There are no barriers to proceeding with treatment.      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 Stage I bulky disease.   --Continue EPOCH-R. Today is Cycle 4 Day 4 --Adverse effects have been reviewed including, but not limited to, alopecia, myelosuppression, peripheral neuropathy, mucositis, liver/renal dysfunction, and possible infusion reaction associated with Rituxan. He agrees to proceed. --PAC in place and ready for  use. --Echocardiogram from 05/22/2020 shows LVEF of 60-65%  --CBC adequate to proceed with treatment today.  Leukocytosis likely related to steroids.  Hemoglobin down to 7.9 this morning.  He is asymptomatic and will hold on transfusion.  Transfusion parameters are less than 7 if asymptomatic and less than 8 if symptomatic. --Will monitor closely with CBC with diff, CMET, LDH, and uric acid --continue Allopurinol 300 mg daily.  May consider stopping allopurinol if uric acid remains stable. --Lovenox for DVT prophylaxis. --Continue home medications for HTN and hyperlipidemia.  --Continue PRN antiemetics. --Continue MiraLAX and Senokot.  The patient will return to the cancer  center on 08/04/2020 for Rituxan and Neulasta and will have labs and a follow-up visit on 08/07/2020.  [name], DNP, AGPCNP-BC, AOCNP Mon/Tues/Thurs/Fri 7am-5pm; Off Wednesdays Cell: [phone]

## 2020-08-01 ENCOUNTER — Other Ambulatory Visit: Payer: Self-pay | Admitting: Oncology

## 2020-08-01 DIAGNOSIS — C8333 Diffuse large B-cell lymphoma, intra-abdominal lymph nodes: Secondary | ICD-10-CM

## 2020-08-01 LAB — COMPREHENSIVE METABOLIC PANEL WITH GFR
ALT: 22 U/L (ref 0–44)
AST: 18 U/L (ref 15–41)
Albumin: 2.9 g/dL — ABNORMAL LOW (ref 3.5–5.0)
Alkaline Phosphatase: 44 U/L (ref 38–126)
Anion gap: 8 (ref 5–15)
BUN: 33 mg/dL — ABNORMAL HIGH (ref 8–23)
CO2: 25 mmol/L (ref 22–32)
Calcium: 8.6 mg/dL — ABNORMAL LOW (ref 8.9–10.3)
Chloride: 108 mmol/L (ref 98–111)
Creatinine, Ser: 0.93 mg/dL (ref 0.61–1.24)
GFR, Estimated: >60 mL/min
Glucose, Bld: 121 mg/dL — ABNORMAL HIGH (ref 70–99)
Potassium: 3.9 mmol/L (ref 3.5–5.1)
Sodium: 141 mmol/L (ref 135–145)
Total Bilirubin: 0.5 mg/dL (ref 0.3–1.2)
Total Protein: 5.1 g/dL — ABNORMAL LOW (ref 6.5–8.1)

## 2020-08-01 LAB — CBC WITH DIFFERENTIAL/PLATELET
Abs Immature Granulocytes: 0.05 K/uL (ref 0.00–0.07)
Basophils Absolute: 0 K/uL (ref 0.0–0.1)
Basophils Relative: 0 %
Eosinophils Absolute: 0 K/uL (ref 0.0–0.5)
Eosinophils Relative: 0 %
HCT: 25.1 % — ABNORMAL LOW (ref 39.0–52.0)
Hemoglobin: 7.9 g/dL — ABNORMAL LOW (ref 13.0–17.0)
Immature Granulocytes: 1 %
Lymphocytes Relative: 3 %
Lymphs Abs: 0.2 K/uL — ABNORMAL LOW (ref 0.7–4.0)
MCH: 28.9 pg (ref 26.0–34.0)
MCHC: 31.5 g/dL (ref 30.0–36.0)
MCV: 91.9 fL (ref 80.0–100.0)
Monocytes Absolute: 0.1 K/uL (ref 0.1–1.0)
Monocytes Relative: 2 %
Neutro Abs: 7.4 K/uL (ref 1.7–7.7)
Neutrophils Relative %: 94 %
Platelets: 323 K/uL (ref 150–400)
RBC: 2.73 MIL/uL — ABNORMAL LOW (ref 4.22–5.81)
RDW: 20.2 % — ABNORMAL HIGH (ref 11.5–15.5)
WBC: 7.8 K/uL (ref 4.0–10.5)
nRBC: 0 % (ref 0.0–0.2)

## 2020-08-01 LAB — LACTATE DEHYDROGENASE: LDH: 107 U/L (ref 98–192)

## 2020-08-01 LAB — URIC ACID: Uric Acid, Serum: 5 mg/dL (ref 3.7–8.6)

## 2020-08-01 MED ORDER — SODIUM CHLORIDE 0.9 % IV SOLN
900.0000 mg/m2 | Freq: Once | INTRAVENOUS | Status: AC
  Administered 2020-08-01: 1740 mg via INTRAVENOUS
  Filled 2020-08-01: qty 87

## 2020-08-01 MED ORDER — HEPARIN SOD (PORK) LOCK FLUSH 100 UNIT/ML IV SOLN: 500.0000 [IU] | INTRAVENOUS | Status: DC | PRN

## 2020-08-01 MED ORDER — SODIUM CHLORIDE 0.9 % IV SOLN
Freq: Once | INTRAVENOUS | Status: AC
  Administered 2020-08-01: 36 mg via INTRAVENOUS
  Filled 2020-08-01 (×2): qty 8

## 2020-08-01 MED ORDER — HEPARIN SOD (PORK) LOCK FLUSH 100 UNIT/ML IV SOLN
500.0000 [IU] | INTRAVENOUS | Status: DC
  Filled 2020-08-01: qty 5

## 2020-08-01 NOTE — Consult Note (Signed)
   [facility] Inpatient Consult   08/01/2020  Connor Adams [birth year] [id number]   [facility] [ACO] Patient: Connor Adams PPO  Patient screened for high risk score for unplanned readmission score and for less than 30 day re-hospitalization.     Plan:  No needs assessed.  Please place a [facility] Care Management consult as appropriate and for questions contact:   Natividad Brood, RN BSN [facility] Liaison  [phone] business mobile phone Toll free office [phone]  Fax number: [phone] [email or web address]

## 2020-08-01 NOTE — Discharge Summary (Signed)
Physician Discharge Summary  Patient ID: Connor Adams MRN: [id number] DOB/AGE: Jun 24, 1946 74 y.o.  Admit date: 07/28/2020 Discharge date: 08/01/2020  Discharge Diagnoses:  Active Problems:   Encounter for antineoplastic chemotherapy   Diffuse large b-cell lymphoma, intra-abdominal lymph nodes (HCC)   Diffuse large B-cell lymphoma of intra-abdominal lymph nodes ([place])  Discharged Condition: good  Discharge Labs:  None pending.   Significant Diagnostic Studies:   CBC Latest Ref Rng & Units 08/01/2020 07/31/2020 07/30/2020  WBC 4.0 - 10.5 K/uL 7.8 14.1(H) 19.2(H)  Hemoglobin 13.0 - 17.0 g/dL 7.9(L) 7.9(L) 8.1(L)  Hematocrit 39 - 52 % 25.1(L) 25.4(L) 26.0(L)  Platelets 150 - 400 K/uL 323 347 357   CMP Latest Ref Rng & Units 08/01/2020 07/31/2020 07/30/2020  Glucose 70 - 99 mg/dL 121(H) 162(H) 128(H)  BUN 8 - 23 mg/dL 33(H) 32(H) 29(H)  Creatinine 0.61 - 1.24 mg/dL 0.93 0.97 1.05  Sodium 135 - 145 mmol/L 141 139 139  Potassium 3.5 - 5.1 mmol/L 3.9 4.1 4.0  Chloride 98 - 111 mmol/L 108 110 110  CO2 22 - 32 mmol/L 25 22 24   Calcium 8.9 - 10.3 mg/dL 8.6(L) 8.7(L) 8.9  Total Protein 6.5 - 8.1 g/dL 5.1(L) 5.3(L) 5.5(L)  Total Bilirubin 0.3 - 1.2 mg/dL 0.5 0.4 <0.1(L)  Alkaline Phos 38 - 126 U/L 44 45 50  AST 15 - 41 U/L 18 19 19   ALT 0 - 44 U/L 22 22 23    Consults: None  Procedures: None  Disposition: Discharge disposition: 01-Home or Self Care      Day of Discharge Physical Exam:  General:  well-nourished in no acute distress.   Eyes:  no scleral icterus.   Respiratory: lungs were clear bilaterally without wheezing or crackles.   Cardiovascular:  Regular rate and rhythm, S1/S2, without murmur, rub or gallop. There was no pedal edema.   GI:  Positive bowel sounds.  Left upper quadrant abdominal mass nonpalpable.   Musculoskeletal:  no spinal tenderness of palpation of vertebral spine.   Skin exam was without echymosis, petichae.   Neuro exam was nonfocal. Patient  was alert and oriented.  Attention was good.  Language was appropriate.  Mood was normal without depression.  Speech was not pressured.  Thought content was not tangential.    Allergies as of 08/01/2020   No Known Allergies     Medication List    STOP taking these medications   erythromycin ophthalmic ointment   predniSONE 20 MG tablet Commonly known as: Deltasone     TAKE these medications   acetaminophen 325 MG tablet Commonly known as: TYLENOL Take 2 tablets (650 mg total) by mouth every 6 (six) hours as needed for mild pain, moderate pain or fever.   allopurinol 300 MG tablet Commonly known as: ZYLOPRIM Take 1 tablet (300 mg total) by mouth daily.   Cozaar 25 MG tablet Generic drug: losartan Take 25 mg by mouth daily.   Crestor 5 MG tablet Generic drug: rosuvastatin Take 5 mg by mouth daily.   diclofenac Sodium 1 % Gel Commonly known as: VOLTAREN Apply 2 g topically 4 (four) times daily as needed (pain).   feeding supplement Liqd Take 237 mLs by mouth 2 (two) times daily between meals.   fluticasone 50 MCG/ACT nasal spray Commonly known as: FLONASE Place 1-2 sprays into both nostrils daily as needed for allergies or rhinitis.   hydrocortisone 2.5 % rectal cream Commonly known as: ANUSOL-HC Place 1 application rectally daily as needed. What changed: reasons to  take this   ibuprofen 200 MG tablet Commonly known as: ADVIL Take 400 mg by mouth every 6 (six) hours as needed for fever, headache or mild pain.   multivitamins ther. w/minerals Tabs tablet Take 1 tablet by mouth daily.   ondansetron 8 MG disintegrating tablet Commonly known as: Zofran ODT Take 1 tablet (8 mg total) by mouth every 8 (eight) hours as needed for nausea or vomiting.   pantoprazole 40 MG tablet Commonly known as: PROTONIX Take 1 tablet (40 mg total) by mouth 2 (two) times daily.   polyethylene glycol 17 g packet Commonly known as: MIRALAX / GLYCOLAX Take 17 g by mouth daily as  needed for moderate constipation.   polyvinyl alcohol 1.4 % ophthalmic solution Commonly known as: LIQUIFILM TEARS Place 1 drop into both eyes as needed for dry eyes.   prochlorperazine 10 MG tablet Commonly known as: COMPAZINE Take 1 tablet (10 mg total) by mouth every 6 (six) hours as needed for nausea or vomiting.   promethazine 25 MG tablet Commonly known as: PHENERGAN Take 1 tablet (25 mg total) by mouth every 6 (six) hours as needed for nausea or vomiting.   senna-docusate 8.6-50 MG tablet Commonly known as: Senokot-S Take 2 tablets by mouth 2 (two) times daily as needed for mild constipation.   sodium bicarbonate/sodium chloride Soln 1 application by Mouth Rinse route as needed for dry mouth.   sucralfate 1 g tablet Commonly known as: Carafate Take 1 tablet (1 g total) by mouth 4 (four) times daily -  with meals and at bedtime.        Hospital Course: Mr. Brann is a 74 year old male with medical history significant for double hit DLBCL.  He presented to the hospital on 07/28/2020 for cycle 4-day 1 of EPOCH-R chemotherapy.  Chemotherapy started as planned on 07/07/2020.  Throughout the course of the patient's admission he has had no complications.  Bowels have been moving without any difficulty with scheduled MiraLAX and Senokot-S. He was able to tolerate food well without difficulty.  He did not have any issues with fevers, chills, sweats, nausea, vomiting or diarrhea.  He successfully completed all chemotherapy at full dose as prescribed. On the final day he had no concerning symptoms.    He is currently scheduled for labs, G-CSF, and rituximab at the cancer center on 08/04/2020.  He will have a follow-up visit in our office on 08/07/2020 with repeat lab work.  He has our contact information and has been instructed to contact us in the event he were to develop any new concerning symptoms or issues in the interim.  He was discharged on 08/01/2020 in excellent  condition.   Discharge Instructions    Activity as tolerated - No restrictions   Complete by: As directed    Diet general   Complete by: As directed       [name], DNP, AGPCNP-BC, AOCNP Mon/Tues/Thurs/Fri 7am-5pm; Off Wednesdays Cell: [phone]

## 2020-08-01 NOTE — Progress Notes (Signed)
Pt discharged home in stable condition. Discharge instructions given. Pt verbalized understanding. No immediate questions or concerns at this time. Pt opted to ambulate off unit.

## 2020-08-04 ENCOUNTER — Other Ambulatory Visit: Payer: Self-pay | Admitting: Hematology and Oncology

## 2020-08-04 ENCOUNTER — Encounter: Payer: Self-pay | Admitting: Hematology and Oncology

## 2020-08-04 ENCOUNTER — Inpatient Hospital Stay: Payer: Medicare PPO

## 2020-08-04 ENCOUNTER — Other Ambulatory Visit: Payer: Self-pay

## 2020-08-04 ENCOUNTER — Inpatient Hospital Stay: Payer: Medicare PPO | Attending: Hematology and Oncology

## 2020-08-04 VITALS — BP 157/72 | HR 84 | Temp 99.7°F | Resp 18

## 2020-08-04 DIAGNOSIS — C8333 Diffuse large B-cell lymphoma, intra-abdominal lymph nodes: Secondary | ICD-10-CM

## 2020-08-04 DIAGNOSIS — Z79899 Other long term (current) drug therapy: Secondary | ICD-10-CM | POA: Diagnosis not present

## 2020-08-04 DIAGNOSIS — Z5111 Encounter for antineoplastic chemotherapy: Secondary | ICD-10-CM | POA: Diagnosis not present

## 2020-08-04 DIAGNOSIS — Z95828 Presence of other vascular implants and grafts: Secondary | ICD-10-CM

## 2020-08-04 DIAGNOSIS — C8338 Diffuse large B-cell lymphoma, lymph nodes of multiple sites: Secondary | ICD-10-CM | POA: Insufficient documentation

## 2020-08-04 LAB — CMP (CANCER CENTER ONLY)
ALT: 17 U/L (ref 0–44)
AST: 15 U/L (ref 15–41)
Albumin: 3.2 g/dL — ABNORMAL LOW (ref 3.5–5.0)
Alkaline Phosphatase: 49 U/L (ref 38–126)
Anion gap: 9 (ref 5–15)
BUN: 26 mg/dL — ABNORMAL HIGH (ref 8–23)
CO2: 27 mmol/L (ref 22–32)
Calcium: 8.4 mg/dL — ABNORMAL LOW (ref 8.9–10.3)
Chloride: 106 mmol/L (ref 98–111)
Creatinine: 0.91 mg/dL (ref 0.61–1.24)
GFR, Estimated: >60 mL/min (ref >60)
Glucose, Bld: 128 mg/dL — ABNORMAL HIGH (ref 70–99)
Potassium: 3.9 mmol/L (ref 3.5–5.1)
Sodium: 142 mmol/L (ref 135–145)
Total Bilirubin: 0.8 mg/dL (ref 0.3–1.2)
Total Protein: 5.6 g/dL — ABNORMAL LOW (ref 6.5–8.1)

## 2020-08-04 LAB — CBC WITH DIFFERENTIAL (CANCER CENTER ONLY)
Abs Immature Granulocytes: 0.05 10*3/uL (ref 0.00–0.07)
Basophils Absolute: 0 10*3/uL (ref 0.0–0.1)
Basophils Relative: 0 %
Eosinophils Absolute: 0.1 10*3/uL (ref 0.0–0.5)
Eosinophils Relative: 1 %
HCT: 27.2 % — ABNORMAL LOW (ref 39.0–52.0)
Hemoglobin: 8.7 g/dL — ABNORMAL LOW (ref 13.0–17.0)
Immature Granulocytes: 1 %
Lymphocytes Relative: 1 %
Lymphs Abs: 0.1 10*3/uL — ABNORMAL LOW (ref 0.7–4.0)
MCH: 28.6 pg (ref 26.0–34.0)
MCHC: 32 g/dL (ref 30.0–36.0)
MCV: 89.5 fL (ref 80.0–100.0)
Monocytes Absolute: 0 10*3/uL — ABNORMAL LOW (ref 0.1–1.0)
Monocytes Relative: 0 %
Neutro Abs: 5.6 10*3/uL (ref 1.7–7.7)
Neutrophils Relative %: 97 %
Platelet Count: 297 10*3/uL (ref 150–400)
RBC: 3.04 MIL/uL — ABNORMAL LOW (ref 4.22–5.81)
RDW: 20.5 % — ABNORMAL HIGH (ref 11.5–15.5)
WBC Count: 5.8 10*3/uL (ref 4.0–10.5)
nRBC: 0 % (ref 0.0–0.2)

## 2020-08-04 LAB — SAMPLE TO BLOOD BANK

## 2020-08-04 LAB — LACTATE DEHYDROGENASE: LDH: 178 U/L (ref 98–192)

## 2020-08-04 LAB — URIC ACID: Uric Acid, Serum: 5.8 mg/dL (ref 3.7–8.6)

## 2020-08-04 MED ORDER — ACETAMINOPHEN 325 MG PO TABS
650.0000 mg | ORAL_TABLET | Freq: Once | ORAL | Status: AC
  Administered 2020-08-04: 650 mg via ORAL

## 2020-08-04 MED ORDER — SODIUM CHLORIDE 0.9% FLUSH
10.0000 mL | Freq: Once | INTRAVENOUS | Status: AC
  Administered 2020-08-04: 10 mL via INTRAVENOUS
  Filled 2020-08-04: qty 10

## 2020-08-04 MED ORDER — PEGFILGRASTIM-CBQV 6 MG/0.6ML ~~LOC~~ SOSY
PREFILLED_SYRINGE | SUBCUTANEOUS | Status: AC
  Filled 2020-08-04: qty 0.6

## 2020-08-04 MED ORDER — HEPARIN SOD (PORK) LOCK FLUSH 100 UNIT/ML IV SOLN
500.0000 [IU] | Freq: Once | INTRAVENOUS | Status: AC
  Administered 2020-08-04: 500 [IU] via INTRAVENOUS
  Filled 2020-08-04: qty 5

## 2020-08-04 MED ORDER — DIPHENHYDRAMINE HCL 25 MG PO CAPS
ORAL_CAPSULE | ORAL | Status: AC
  Filled 2020-08-04: qty 1

## 2020-08-04 MED ORDER — DIPHENHYDRAMINE HCL 25 MG PO CAPS
25.0000 mg | ORAL_CAPSULE | Freq: Once | ORAL | Status: AC
  Administered 2020-08-04: 25 mg via ORAL

## 2020-08-04 MED ORDER — ACETAMINOPHEN 325 MG PO TABS
ORAL_TABLET | ORAL | Status: AC
  Filled 2020-08-04: qty 2

## 2020-08-04 MED ORDER — SODIUM CHLORIDE 0.9 % IV SOLN
375.0000 mg/m2 | Freq: Once | INTRAVENOUS | Status: AC
  Administered 2020-08-04: 700 mg via INTRAVENOUS
  Filled 2020-08-04: qty 50

## 2020-08-04 MED ORDER — PEGFILGRASTIM-CBQV 6 MG/0.6ML ~~LOC~~ SOSY
6.0000 mg | PREFILLED_SYRINGE | Freq: Once | SUBCUTANEOUS | Status: AC
  Administered 2020-08-04: 6 mg via SUBCUTANEOUS

## 2020-08-04 NOTE — Patient Instructions (Signed)

## 2020-08-04 NOTE — Progress Notes (Signed)
Met with patient at registration to introduce myself as Arboriculturist and to offer available resources.  Discussed one-time $1000 Radio broadcast assistant to assist with personal expenses while going through treatment.He states they have good insurance and no financial burdens.  Gave him my card if interested in applying and for any additional financial questions or concerns.

## 2020-08-04 NOTE — Patient Instructions (Signed)
Rituximab injection What is this medicine? RITUXIMAB (ri TUX i mab) is a monoclonal antibody. It is used to treat certain types of cancer like non-Hodgkin lymphoma and chronic lymphocytic leukemia. It is also used to treat rheumatoid arthritis, granulomatosis with polyangiitis (or Wegener's granulomatosis), microscopic polyangiitis, and pemphigus vulgaris. This medicine may be used for other purposes; ask your health care provider or pharmacist if you have questions. COMMON BRAND NAME(S): Rituxan, RUXIENCE What should I tell my health care provider before I take this medicine? They need to know if you have any of these conditions:  heart disease  infection (especially a virus infection such as hepatitis B, chickenpox, cold sores, or herpes)  immune system problems  irregular heartbeat  kidney disease  low blood counts, like low white cell, platelet, or red cell counts  lung or breathing disease, like asthma  recently received or scheduled to receive a vaccine  an unusual or allergic reaction to rituximab, other medicines, foods, dyes, or preservatives  pregnant or trying to get pregnant  breast-feeding How should I use this medicine? This medicine is for infusion into a vein. It is administered in a hospital or clinic by a specially trained health care professional. A special MedGuide will be given to you by the pharmacist with each prescription and refill. Be sure to read this information carefully each time. Talk to your pediatrician regarding the use of this medicine in children. This medicine is not approved for use in children. Overdosage: If you think you have taken too much of this medicine contact a poison control center or emergency room at once. NOTE: This medicine is only for you. Do not share this medicine with others. What if I miss a dose? It is important not to miss a dose. Call your doctor or health care professional if you are unable to keep an appointment. What  may interact with this medicine?  cisplatin  live virus vaccines This list may not describe all possible interactions. Give your health care provider a list of all the medicines, herbs, non-prescription drugs, or dietary supplements you use. Also tell them if you smoke, drink alcohol, or use illegal drugs. Some items may interact with your medicine. What should I watch for while using this medicine? Your condition will be monitored carefully while you are receiving this medicine. You may need blood work done while you are taking this medicine. This medicine can cause serious allergic reactions. To reduce your risk you may need to take medicine before treatment with this medicine. Take your medicine as directed. In some patients, this medicine may cause a serious brain infection that may cause death. If you have any problems seeing, thinking, speaking, walking, or standing, tell your healthcare professional right away. If you cannot reach your healthcare professional, urgently seek other source of medical care. Call your doctor or health care professional for advice if you get a fever, chills or sore throat, or other symptoms of a cold or flu. Do not treat yourself. This drug decreases your body's ability to fight infections. Try to avoid being around people who are sick. Do not become pregnant while taking this medicine or for at least 12 months after stopping it. Women should inform their doctor if they wish to become pregnant or think they might be pregnant. There is a potential for serious side effects to an unborn child. Talk to your health care professional or pharmacist for more information. Do not breast-feed an infant while taking this medicine or for at   least 6 months after stopping it. What side effects may I notice from receiving this medicine? Side effects that you should report to your doctor or health care professional as soon as possible:  allergic reactions like skin rash, itching or  hives; swelling of the face, lips, or tongue  breathing problems  chest pain  changes in vision  diarrhea  headache with fever, neck stiffness, sensitivity to light, nausea, or confusion  fast, irregular heartbeat  loss of memory  low blood counts - this medicine may decrease the number of white blood cells, red blood cells and platelets. You may be at increased risk for infections and bleeding.  mouth sores  problems with balance, talking, or walking  redness, blistering, peeling or loosening of the skin, including inside the mouth  signs of infection - fever or chills, cough, sore throat, pain or difficulty passing urine  signs and symptoms of kidney injury like trouble passing urine or change in the amount of urine  signs and symptoms of liver injury like dark yellow or brown urine; general ill feeling or flu-like symptoms; light-colored stools; loss of appetite; nausea; right upper belly pain; unusually weak or tired; yellowing of the eyes or skin  signs and symptoms of low blood pressure like dizziness; feeling faint or lightheaded, falls; unusually weak or tired  stomach pain  swelling of the ankles, feet, hands  unusual bleeding or bruising  vomiting Side effects that usually do not require medical attention (report to your doctor or health care professional if they continue or are bothersome):  headache  joint pain  muscle cramps or muscle pain  nausea  tiredness This list may not describe all possible side effects. Call your doctor for medical advice about side effects. You may report side effects to FDA at 1-800-FDA-1088. Where should I keep my medicine? This drug is given in a hospital or clinic and will not be stored at home. NOTE: This sheet is a summary. It may not cover all possible information. If you have questions about this medicine, talk to your doctor, pharmacist, or health care provider.  2020 Elsevier/Gold Standard (2018-10-18  22:01:36)  

## 2020-08-05 ENCOUNTER — Telehealth: Payer: Self-pay | Admitting: *Deleted

## 2020-08-05 NOTE — Telephone Encounter (Signed)
[facility] on file for prescriptions.

## 2020-08-05 NOTE — Telephone Encounter (Signed)
Patients wife called to advise that patient is having mouth sores and some white coating on his tongue and was interested in getting patient a magic mouthwash prescription.  Routed to MD to advise

## 2020-08-07 ENCOUNTER — Other Ambulatory Visit: Payer: Self-pay

## 2020-08-07 ENCOUNTER — Other Ambulatory Visit: Payer: Self-pay | Admitting: *Deleted

## 2020-08-07 ENCOUNTER — Inpatient Hospital Stay: Payer: Medicare PPO | Admitting: Hematology and Oncology

## 2020-08-07 VITALS — BP 112/64 | HR 69 | Temp 97.1°F | Resp 18 | Ht 68.0 in | Wt 161.3 lb

## 2020-08-07 DIAGNOSIS — C8333 Diffuse large B-cell lymphoma, intra-abdominal lymph nodes: Secondary | ICD-10-CM | POA: Diagnosis not present

## 2020-08-07 DIAGNOSIS — Z5111 Encounter for antineoplastic chemotherapy: Secondary | ICD-10-CM | POA: Diagnosis not present

## 2020-08-07 DIAGNOSIS — C8338 Diffuse large B-cell lymphoma, lymph nodes of multiple sites: Secondary | ICD-10-CM | POA: Diagnosis not present

## 2020-08-07 DIAGNOSIS — Z79899 Other long term (current) drug therapy: Secondary | ICD-10-CM | POA: Diagnosis not present

## 2020-08-07 MED ORDER — MAGIC MOUTHWASH W/LIDOCAINE: 5.0000 mL | Freq: Four times a day (QID) | ORAL | 1 refills | Status: DC

## 2020-08-07 MED FILL — MAGIC MOUTHWASH D/M/L: 8 days supply | Qty: 240 | Fill #0

## 2020-08-07 NOTE — Progress Notes (Signed)
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   1) 04/17/2020:  CT A/P showed dominant nodal mass (11.5 x 10.9 cm) within the jejunal mesentery, highly suspicious for lymphoma. Additionally there are left lower lobe pleural-based pulmonary nodules 2) 04/24/2020: establish care with Dr. Lorenso Courier 3) 04/28/2020:  PET CT scan shows Large solid left mesenteric mass 13.0 cm with maximum SUV of 21.2, [place] 5 with local hypermetabolic porta hepatis and retroperitoneal lymph nodes. Constitutes bulky Stage I disease.  4) 05/27/2020-05/31/2020: Cycle 1 of R-EPOCH 5) 06/16/2020-06/23/2020: Cycle 2 of R-EPOCH 6) 07/07/2020-07/11/2020: Cycle 3 of R-EPOCH 7) 07/28/2020-08/01/2020: Cycle 4 of R-EPOCH  Interval History:  Connor Adams 74 y.o. male with medical history significant for Stage I DLBCL who presents for a follow up visit. The patient was last seen while in house from 07/28/2020-08/01/2020 for R-EPOCH. In the interim since then he received rituximab and GCSF therapy on 08/04/2020.  On exam today Wolfinger is accompanied by his wife.  He notes that overall he has been well since his discharge from the hospital.  He notes that he worked in his yard for approximately 5 hours on Friday after discharge an additional 3 hours on Saturday.  He reports that on Sunday that is when "it hit".  He notes that his energy dropped at that point and has been slowly picking it up since that time.  He reports that he has not been having any issues with nausea, vomiting, or diarrhea.  His weight is relatively stable at 161 pounds compared to 160 pounds on 06/25/2020.  Unfortunately has developed some sores in his mouth particularly on the roof of his mouth and on the cheeks.  He notes also that his taste buds  have been affected and he is not been able to enjoy food as much as he had previously.  He thinks that this might be affecting his appetite.  He otherwise has had no fevers, chills, sweats, nausea, running or diarrhea.  A full 10 point ROS is listed below.  MEDICAL HISTORY:  Past Medical History:  Diagnosis Date  . Arthritis   . Chronic kidney disease (CKD), stage III (moderate) (HCC)   . COVID-19   . GERD (gastroesophageal reflux disease)    occ  . Hemorrhoids   . History of ETT 3/05   low risk  . History of hiatal hernia   . History of kidney stones   . HLD (hyperlipidemia)   . HTN (hypertension)   . Left inguinal hernia   . Melanoma ([place])    excied  . 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 Stroke [facility]) 2011   tia   . TIA (transient ischemic attack) 4/11   associated w slurred speecha ndmild facial droop. lasted for about 10 minutes. Had MRI, etc with [facility] that per his report was ok (dr. Stephannie Li). Echo (4/11): EF 55-60%, no regional WMAs, normal diastolic function, mild LAE, no source of embolus    SURGICAL HISTORY: Past Surgical History:  Procedure Laterality Date  . BMI 30.2 muscular  12/08/04  . curretage actinic keratosis R ear  01/20/05  . ETT low prob of CAD  11/21/03  . excision melanoma in situ back  01/20/05  . hemorrhoids sclerosed at colonoscopy  12/19/05  . IR IMAGING GUIDED PORT  INSERTION  05/19/2020  . LAPAROSCOPIC INGUINAL HERNIA REPAIR Left 12/20/1995  . LUMBAR DISC SURGERY    . NERVE REPAIR     back of neck  . retinal laser surgery Left 09/20/1986  . SEPTOPLASTY  11/19/98  . TOTAL KNEE ARTHROPLASTY Left 12/13/2016   Procedure: TOTAL KNEE ARTHROPLASTY WITH RIGHT KNEE CORTISONE INJECTION;  Surgeon: Frederik Pear, MD;  Location: [place];  Service: Orthopedics;  Laterality: Left;  . uric acid 6.9  12/22/04  . x-ray l great toe  07/21/00    SOCIAL HISTORY: Social History   Socioeconomic History  . Marital status: Married    Spouse name: [name]  .  Number of children: 2  . Years of education: 93  . Highest education level: Not on file  Occupational History  . Occupation: Systems developer: [employer]  Tobacco Use  . Smoking status: Never Smoker  . Smokeless tobacco: Never Used  Vaping Use  . Vaping Use: Never used  Substance and Sexual Activity  . Alcohol use: No  . Drug use: No  . Sexual activity: Yes  Other Topics Concern  . Not on file  Social History Narrative   Married to [employer] at [employer].   Son, [name] married   Son, [name], Married.    Caffeine use: 1 cup coffee per day   Social Determinants of Health   Financial Resource Strain:   . Difficulty of Paying Living Expenses: Not on file  Food Insecurity:   . Worried About Charity fundraiser in the Last Year: Not on file  . Ran Out of Food in the Last Year: Not on file  Transportation Needs:   . Lack of Transportation (Medical): Not on file  . Lack of Transportation (Non-Medical): Not on file  Physical Activity:   . Days of Exercise per Week: Not on file  . Minutes of Exercise per Session: Not on file  Stress:   . Feeling of Stress : Not on file  Social Connections:   . Frequency of Communication with Friends and Family: Not on file  . Frequency of Social Gatherings with Friends and Family: Not on file  . Attends Religious Services: Not on file  . Active Member of Clubs or Organizations: Not on file  . Attends Archivist Meetings: Not on file  . Marital Status: Not on file  Intimate Partner Violence:   . Fear of Current or Ex-Partner: Not on file  . Emotionally Abused: Not on file  . Physically Abused: Not on file  . Sexually Abused: Not on file    FAMILY HISTORY: Family History  Problem Relation Age of Onset  . Liver cancer Sister   . Heart failure Brother 44       CABGx4  . Heart attack Mother 83  . Uterine cancer Mother   . Heart attack Father 50  . Asthma Son   . Diabetes Brother   .  Prostate cancer Brother     ALLERGIES:  has No Known Allergies.  MEDICATIONS:  Current Outpatient Medications  Medication Sig Dispense Refill  . pantoprazole (PROTONIX) 40 MG tablet Take 1 tablet (40 mg total) by mouth 2 (two) times daily. 60 tablet 5  . acetaminophen (TYLENOL) 325 MG tablet Take 2 tablets (650 mg total) by mouth every 6 (six) hours as needed for mild pain, moderate pain or fever.    . diclofenac Sodium (VOLTAREN) 1 % GEL Apply 2 g  topically 4 (four) times daily as needed (pain). (Patient not taking: Reported on 08/07/2020)    . feeding supplement, ENSURE ENLIVE, (ENSURE ENLIVE) LIQD Take 237 mLs by mouth 2 (two) times daily between meals. 237 mL 12  . fluticasone (FLONASE) 50 MCG/ACT nasal spray Place 1-2 sprays into both nostrils daily as needed for allergies or rhinitis.    . hydrocortisone (ANUSOL-HC) 2.5 % rectal cream Place 1 application rectally daily as needed. (Patient taking differently: Place 1 application rectally daily as needed for hemorrhoids. ) 30 g 0  . ibuprofen (ADVIL) 200 MG tablet Take 400 mg by mouth every 6 (six) hours as needed for fever, headache or mild pain. (Patient not taking: Reported on 07/28/2020)    . losartan (COZAAR) 25 MG tablet Take 25 mg by mouth daily.     . magic mouthwash w/lidocaine SOLN Take 5 mLs by mouth 4 (four) times daily. Lidocaine 80 mls; benadryl liquid 80 mls; maalox 80 mls 240 mL 1  . Multiple Vitamins-Minerals (MULTIVITAMINS THER. W/MINERALS) TABS Take 1 tablet by mouth daily.      . ondansetron (ZOFRAN ODT) 8 MG disintegrating tablet Take 1 tablet (8 mg total) by mouth every 8 (eight) hours as needed for nausea or vomiting. 30 tablet 5  . polyethylene glycol (MIRALAX / GLYCOLAX) 17 g packet Take 17 g by mouth daily as needed for moderate constipation. 14 each 0  . polyvinyl alcohol (LIQUIFILM TEARS) 1.4 % ophthalmic solution Place 1 drop into both eyes as needed for dry eyes.    Marland Kitchen prochlorperazine (COMPAZINE) 10 MG tablet  Take 1 tablet (10 mg total) by mouth every 6 (six) hours as needed for nausea or vomiting. (Patient not taking: Reported on 07/28/2020) 30 tablet 0  . promethazine (PHENERGAN) 25 MG tablet Take 1 tablet (25 mg total) by mouth every 6 (six) hours as needed for nausea or vomiting. (Patient not taking: Reported on 07/28/2020) 45 tablet 2  . rosuvastatin (CRESTOR) 5 MG tablet Take 5 mg by mouth daily.     Marland Kitchen senna-docusate (SENOKOT-S) 8.6-50 MG tablet Take 2 tablets by mouth 2 (two) times daily as needed for mild constipation.    . Sodium Chloride-Sodium Bicarb (SODIUM BICARBONATE/SODIUM CHLORIDE) SOLN 1 application by Mouth Rinse route as needed for dry mouth.    . sucralfate (CARAFATE) 1 g tablet Take 1 tablet (1 g total) by mouth 4 (four) times daily -  with meals and at bedtime. 60 tablet 3   No current facility-administered medications for this visit.    REVIEW OF SYSTEMS:   Constitutional: ( - ) fevers, ( - )  chills , ( - ) night sweats Eyes: ( - ) blurriness of vision, ( - ) double vision, ( - ) watery eyes Ears, nose, mouth, throat, and face: ( - ) mucositis, ( - ) sore throat Respiratory: ( - ) cough, ( - ) dyspnea, ( - ) wheezes Cardiovascular: ( - ) palpitation, ( - ) chest discomfort, ( - ) lower extremity swelling Gastrointestinal:  ( - ) nausea, ( - ) heartburn, ( - ) change in bowel habits Skin: ( - ) abnormal skin rashes Lymphatics: ( - ) new lymphadenopathy, ( - ) easy bruising Neurological: ( - ) numbness, ( - ) tingling, ( - ) new weaknesses Behavioral/Psych: ( - ) mood change, ( - ) new changes  All other systems were reviewed with the patient and are negative.  PHYSICAL EXAMINATION: ECOG PERFORMANCE STATUS: 1 - Symptomatic but completely  ambulatory  Vitals:   08/07/20 1126  BP: 112/64  Pulse: 69  Resp: 18  Temp: (!) 97.1 F (36.2 C)  SpO2: 99%   Filed Weights   08/07/20 1126  Weight: 161 lb 4.8 oz (73.2 kg)    GENERAL: well appearing elderly Caucasian male.  alert, no distress and comfortable SKIN: skin color, texture, turgor are normal, no rashes or significant lesions. Now bald, prior erythematous skin lesions clearing up.  EYES: conjunctiva are pink and non-injected, sclera clear LUNGS: clear to auscultation and percussion with normal breathing effort HEART: regular rate & rhythm and no murmurs and no lower extremity edema Musculoskeletal: no cyanosis of digits and no clubbing  PSYCH: alert & oriented x 3, fluent speech NEURO: no focal motor/sensory deficits  LABORATORY DATA:  I have reviewed the data as listed CBC Latest Ref Rng & Units 08/04/2020 08/01/2020 07/31/2020  WBC 4.0 - 10.5 K/uL 5.8 7.8 14.1(H)  Hemoglobin 13.0 - 17.0 g/dL 8.7(L) 7.9(L) 7.9(L)  Hematocrit 39 - 52 % 27.2(L) 25.1(L) 25.4(L)  Platelets 150 - 400 K/uL 297 323 347    CMP Latest Ref Rng & Units 08/04/2020 08/01/2020 07/31/2020  Glucose 70 - 99 mg/dL 128(H) 121(H) 162(H)  BUN 8 - 23 mg/dL 26(H) 33(H) 32(H)  Creatinine 0.61 - 1.24 mg/dL 0.91 0.93 0.97  Sodium 135 - 145 mmol/L 142 141 139  Potassium 3.5 - 5.1 mmol/L 3.9 3.9 4.1  Chloride 98 - 111 mmol/L 106 108 110  CO2 22 - 32 mmol/L 27 25 22   Calcium 8.9 - 10.3 mg/dL 8.4(L) 8.6(L) 8.7(L)  Total Protein 6.5 - 8.1 g/dL 5.6(L) 5.1(L) 5.3(L)  Total Bilirubin 0.3 - 1.2 mg/dL 0.8 0.5 0.4  Alkaline Phos 38 - 126 U/L 49 44 45  AST 15 - 41 U/L 15 18 19   ALT 0 - 44 U/L 17 22 22     RADIOGRAPHIC STUDIES: I have personally reviewed the radiological images as listed and agreed with the findings in the report: large abdominal mass consistent with biopsy proven DLBCL. Bulky tumor.  No results found.  ASSESSMENT & PLAN HURSHEL BOUILLON 74 y.o. male with medical history significant for Stage I DLBCL who presents for a follow up visit.  After review the labs, review the imaging, review the pathology and discussion with the patient the findings are most consistent with a stage I double hit diffuse large B-cell lymphoma  predominantly involving the lymph nodes of the abdomen.  At this time I think he continues to be an excellent candidate for chemotherapy treatment.   On exam today Mr. Stammer notes he has been well in the interim since her last visit other than development of some mouth ulcers.  His energy level is good and his hemoglobin has bumped up to 8.7 on last check on Monday.  He does not have any major questions or concerns today.  We will prescribe him Magic mouthwash for his mouth ulcers, but otherwise plan to see him back for his fifth cycle of chemotherapy as scheduled.  The current plan is for 6 cycles of R-EPOCH chemotherapy to be administered in the inpatient setting. He is currently s/p Cycle 4 with a tentative start date of Cycle 5 on 08/18/2020.   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  --findings consistent with a double HIT lymphoma. Treatment of choice is R-EPOCH chemotherapy in the inpatient setting. --he is  currently s/p Cycle 4 administered on 07/28/2020 --patient has a port in place. TTE was performed and showed strong baseline heart function. --plan for Cycle 5 of R-EPOCH to start on 08/18/2020. Direct admission to the hospital to be planned for that time.   #Oral Ulcers --new oral lesion on roof of mouth and buccal membranes --recommend magic mouthwash, called in to [facility]  #Symptom Management --discontinue allopurinol 300mg  PO daily  --prescribed ondansetron 8mg  PO q8H PRN and compazine 10mg  PO q6H prn for nausea --continue Protonix in the setting of high dose steroids --encourage daily BM, prescribed senna docusate and miralax.  Orders Placed This Encounter  Procedures  . NM PET Image Restag (PS) Skull Base To Thigh    Standing Status:   Future    Standing Expiration Date:   08/07/2021    Order Specific Question:   If indicated for the ordered procedure, I  authorize the administration of a radiopharmaceutical per Radiology protocol    Answer:   Yes    Order Specific Question:   Preferred imaging location?    Answer:   [name]   All questions were answered. The patient knows to call the clinic with any problems, questions or concerns.  A total of more than 30 minutes were spent on this encounter and over half of that time was spent on counseling and coordination of care as outlined above.   [name], MD Department of Hematology/Oncology [facility] Phone: [phone] Pager: [phone] Email: [email or web address]  08/07/2020 2:34 PM

## 2020-08-15 ENCOUNTER — Ambulatory Visit (HOSPITAL_COMMUNITY)
Admission: RE | Admit: 2020-08-15 | Discharge: 2020-08-15 | Disposition: A | Discharge status: Home / Self Care | Payer: Medicare PPO | Source: Ambulatory Visit | Attending: Hematology and Oncology | Admitting: Hematology and Oncology

## 2020-08-15 ENCOUNTER — Other Ambulatory Visit: Payer: Self-pay

## 2020-08-15 ENCOUNTER — Other Ambulatory Visit: Payer: Self-pay | Admitting: *Deleted

## 2020-08-15 DIAGNOSIS — N183 Chronic kidney disease, stage 3 unspecified: Secondary | ICD-10-CM | POA: Diagnosis present

## 2020-08-15 DIAGNOSIS — Z85828 Personal history of other malignant neoplasm of skin: Secondary | ICD-10-CM | POA: Diagnosis not present

## 2020-08-15 DIAGNOSIS — I129 Hypertensive chronic kidney disease with stage 1 through stage 4 chronic kidney disease, or unspecified chronic kidney disease: Secondary | ICD-10-CM | POA: Diagnosis present

## 2020-08-15 DIAGNOSIS — E785 Hyperlipidemia, unspecified: Secondary | ICD-10-CM | POA: Diagnosis present

## 2020-08-15 DIAGNOSIS — Z8673 Personal history of transient ischemic attack (TIA), and cerebral infarction without residual deficits: Secondary | ICD-10-CM | POA: Diagnosis not present

## 2020-08-15 DIAGNOSIS — Z20822 Contact with and (suspected) exposure to covid-19: Secondary | ICD-10-CM | POA: Diagnosis present

## 2020-08-15 DIAGNOSIS — C8338 Diffuse large B-cell lymphoma, lymph nodes of multiple sites: Secondary | ICD-10-CM | POA: Diagnosis not present

## 2020-08-15 DIAGNOSIS — C8333 Diffuse large B-cell lymphoma, intra-abdominal lymph nodes: Secondary | ICD-10-CM

## 2020-08-15 DIAGNOSIS — Z8616 Personal history of COVID-19: Secondary | ICD-10-CM | POA: Diagnosis not present

## 2020-08-15 DIAGNOSIS — R918 Other nonspecific abnormal finding of lung field: Secondary | ICD-10-CM | POA: Diagnosis present

## 2020-08-15 DIAGNOSIS — Z96653 Presence of artificial knee joint, bilateral: Secondary | ICD-10-CM | POA: Diagnosis present

## 2020-08-15 DIAGNOSIS — K219 Gastro-esophageal reflux disease without esophagitis: Secondary | ICD-10-CM | POA: Diagnosis present

## 2020-08-15 DIAGNOSIS — Z9221 Personal history of antineoplastic chemotherapy: Secondary | ICD-10-CM | POA: Diagnosis not present

## 2020-08-15 DIAGNOSIS — D72819 Decreased white blood cell count, unspecified: Secondary | ICD-10-CM | POA: Diagnosis not present

## 2020-08-15 DIAGNOSIS — Z5111 Encounter for antineoplastic chemotherapy: Secondary | ICD-10-CM | POA: Diagnosis not present

## 2020-08-15 DIAGNOSIS — Z79899 Other long term (current) drug therapy: Secondary | ICD-10-CM | POA: Diagnosis not present

## 2020-08-15 LAB — GLUCOSE, CAPILLARY: Glucose-Capillary: 83 mg/dL (ref 70–99)

## 2020-08-15 MED ORDER — FLUDEOXYGLUCOSE F - 18 (FDG) INJECTION
7.9700 | Freq: Once | INTRAVENOUS | Status: AC
  Administered 2020-08-15: 7.97 via INTRAVENOUS

## 2020-08-15 MED ORDER — PREDNISONE 20 MG PO TABS: 20.0000 mg | ORAL_TABLET | Freq: Every day | ORAL | 0 refills | Status: DC

## 2020-08-16 ENCOUNTER — Other Ambulatory Visit (HOSPITAL_COMMUNITY)
Admission: RE | Admit: 2020-08-16 | Discharge: 2020-08-16 | Disposition: A | Discharge status: Home / Self Care | Payer: Medicare PPO | Source: Ambulatory Visit | Attending: Hematology and Oncology | Admitting: Hematology and Oncology

## 2020-08-16 DIAGNOSIS — Z20822 Contact with and (suspected) exposure to covid-19: Secondary | ICD-10-CM | POA: Insufficient documentation

## 2020-08-16 DIAGNOSIS — Z01812 Encounter for preprocedural laboratory examination: Secondary | ICD-10-CM | POA: Insufficient documentation

## 2020-08-17 LAB — SARS CORONAVIRUS 2 (TAT 6-24 HRS): SARS Coronavirus 2: NEGATIVE

## 2020-08-17 NOTE — H&P (Addendum)
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   1) 04/17/2020:  CT A/P showed dominant nodal mass (11.5 x 10.9 cm) within the jejunal mesentery, highly suspicious for lymphoma. Additionally there are left lower lobe pleural-based pulmonary nodules 2) 04/24/2020: establish care with Dr. Lorenso Courier 3) 04/28/2020:  PET CT scan shows Large solid left mesenteric mass 13.0 cm with maximum SUV of 21.2, Deauville 5 with local hypermetabolic porta hepatis and retroperitoneal lymph nodes. Constitutes bulky Stage I disease.   Reason for Admission: double hit lymphoma, Cycle #5 EPOCH-R  HPI: Connor Adams 74 y.o. 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   On review of the previous records Connor Adams underwent a CT abdomen pelvis on 04/17/2020 at which time a nodal mass 11.5 x 10.9 cm was noted within the jejunal mesentery.  There were also left lower lobe pleural-based pulmonary nodules noted at that time as well as soft tissue fullness at the ileocecal valve.  Due to concern for this finding the patient was referred to oncology for further evaluation and management.  A PET scan was performed on 04/28/2020 showed a large solid left mesenteric mass 13.0 cm with maximum SUV of 21.2, Deauville 5, hypermetabolic porta hepatis and retroperitoneal lymph nodes along with small but hypermetabolic juxtadiaphragmatic/pericardial lymph nodes in the lower chest, generally Deauville 4 and Deauville 5. A biopsy of the mesenteric mass was performed on 05/08/2020 which was consistent with Large B-cell lymphoma. 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 It was recommend for him to begin chemotherapy with EPOCH-R.   In anticipation of chemotherapy, the patient had a PAC placed on 05/19/2020 and an echocardiogram completed on 05/22/2020 which showed an LVEF of 60-65%.  The patient was seen for admission today.  He continues to feel well overall.  Appetite has been decreased somewhat due to recent issues with mucositis.  He reports that his mucositis is much improved today.  The patient denies recent fevers and chills.  Denies chest pain, shortness of breath, abdominal pain, nausea, vomiting.  Bowels are moving without any difficulty. Denies headaches and dizziness.  Denies bleeding.  The patient is seen today for admission to the hospital for cycle #5 EPOCH-R.    Diagnosis Date  . Arthritis   . Chronic kidney disease (CKD), stage III (moderate) (HCC)   . COVID-19   . GERD (gastroesophageal reflux disease)    occ  . Hemorrhoids   . History of ETT 3/05   low risk  . History of hiatal hernia   . History of kidney stones   . HLD (hyperlipidemia)   . HTN (hypertension)   . Left inguinal hernia   . Melanoma ([place])    excied  . 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 Stroke [facility]) 2011   tia   . TIA (transient ischemic attack) 4/11   associated w slurred speecha ndmild facial droop. lasted for about 10 minutes. Had MRI, etc with [facility] that per his report was ok (dr. Stephannie Li). Echo (4/11): EF 55-60%, no regional WMAs, normal diastolic function, mild LAE, no source of embolus  Past Surgical History:  Procedure Laterality Date  . BMI 30.2 muscular  12/08/04  . curretage actinic keratosis R ear  01/20/05  . ETT low prob of CAD  11/21/03  . excision melanoma in situ back  01/20/05  . hemorrhoids sclerosed at colonoscopy  12/19/05  . IR IMAGING GUIDED PORT INSERTION  05/19/2020  . LAPAROSCOPIC INGUINAL HERNIA REPAIR Left 12/20/1995  . LUMBAR DISC SURGERY    . NERVE REPAIR     back of neck  .  retinal laser surgery Left 09/20/1986  . SEPTOPLASTY  11/19/98  . TOTAL KNEE ARTHROPLASTY Left 12/13/2016   Procedure: TOTAL KNEE ARTHROPLASTY WITH RIGHT KNEE CORTISONE INJECTION;  Surgeon: Frederik Pear, MD;  Location: [place];  Service: Orthopedics;  Laterality: Left;  . uric acid 6.9  12/22/04  . x-ray l great toe  07/21/00       Fre Past Surgical History:  Procedure Laterality Date  . BMI 30.2 muscular  12/08/04  . curretage actinic keratosis R ear  01/20/05  . ETT low prob of CAD  11/21/03  . excision melanoma in situ back  01/20/05  . hemorrhoids sclerosed at colonoscopy  12/19/05  . IR IMAGING GUIDED PORT INSERTION  05/19/2020  . LAPAROSCOPIC INGUINAL HERNIA REPAIR Left 12/20/1995  . LUMBAR DISC SURGERY    . NERVE REPAIR     back of neck  . retinal laser surgery Left 09/20/1986  . SEPTOPLASTY  11/19/98  . TOTAL KNEE ARTHROPLASTY Left 12/13/2016   Procedure: TOTAL KNEE ARTHROPLASTY WITH RIGHT KNEE CORTISONE INJECTION;  Surgeon: Frederik Pear, MD;  Location: [place];  Service: Orthopedics;  Laterality: Left;  . uric acid 6.9  12/22/04  . x-ray l great toe  07/21/00  quency Provider Last Rate Last Admin  . acetaminophen (TYLENOL) tablet 650 mg  650 mg Oral Q6H PRN Curcio, Kristin R, NP      . allopurinol (ZYLOPRIM) tablet 300 mg  300 mg Oral Daily Curcio, Roselie Awkward, NP      . Chlorhexidine Gluconate Cloth 2 % PADS 6 each  6 each Topical Daily Curcio, Kristin R, NP      . enoxaparin (LOVENOX) injection 40 mg  40 mg Subcutaneous Q24H Curcio, Kristin R, NP      . feeding supplement (ENSURE ENLIVE / ENSURE PLUS) liquid 237 mL  237 mL Oral BID BM Curcio, Roselie Awkward, NP      . hydrocortisone (ANUSOL-HC) 2.5 % rectal cream 1 application  1 application Rectal Daily PRN Curcio, Roselie Awkward, NP      . losartan (COZAAR) tablet 25 mg  25 mg Oral Daily Curcio, Kristin R, NP      . multivitamins ther. w/minerals tablet 1 tablet  1 tablet Oral Daily Curcio, Kristin R, NP      . ondansetron (ZOFRAN-ODT) disintegrating tablet  8 mg  8 mg Oral Q8H PRN Curcio, Kristin R, NP      . pantoprazole (PROTONIX) EC tablet 40 mg  40 mg Oral BID Curcio, Kristin R, NP      . polyethylene glycol (MIRALAX / GLYCOLAX) packet 17 g  17 g Oral Daily Curcio, Kristin R, NP      . prochlorperazine (COMPAZINE) tablet 10 mg  10 mg Oral Q6H PRN Curcio, Kristin R, NP      . rosuvastatin (CRESTOR) tablet 5 mg  5 mg Oral Daily Curcio, Kristin R, NP      . senna-docusate (Senokot-S) tablet 2 tablet  2 tablet Oral BID [name]  R, NP      . sodium bicarbonate/sodium chloride mouthwash [id number]  1 application Mouth Rinse PRN Curcio, Kristin R, NP      . sodium chloride flush (NS) 0.9 % injection 10-40 mL  10-40 mL Intracatheter Q12H Curcio, Kristin R, NP      . sodium chloride flush (NS) 0.9 % injection 10-40 mL  10-40 mL Intracatheter PRN Curcio, Kristin R, NP      . sucralfate (CARAFATE) 1 GM/10ML suspension 1 g  1 g Oral TID WC & HS Curcio, Roselie Awkward, NP           Family History  Problem Relation Age of Onset  . Liver cancer Sister   . Heart failure Brother 44       CABGx4  . Heart attack Mother 71  . Uterine cancer Mother   . Heart attack Father 12  . Asthma Son   . Diabetes Brother   . Prostate cancer Brother   Relation Age of Onset  . Liver cancer Sister   . Heart failure Brother 44       CABGx4  . Heart attack Mother 22  . Uterine cancer Mother   . Heart attack Father 83  . Asthma Son   . Diabetes Brother   . Prostate cancer Brother      Socioeconomic History  . Marital status: Married    Spouse name: [name]  . Number of children: 2  . Years of education: 44  . Highest education level: Not on file  Occupational History  . Occupation: Systems developer: [employer]  Tobacco Use  . Smoking status: Never Smoker  . Smokeless tobacco: Never Used  Vaping Use  . Vaping Use: Never used  Substance and Sexual Activity  . Alcohol use: No  . Drug use: No  . Sexual activity: Not on file  Other Topics  Concern  . Not on file  Social History Narrative   Married to [employer] at [employer].   Son, [name] married   Son, [name], Married.    Caffeine use: 1 cup coffee per day   Social Determinants of Health   Financial Resource Strain:   . Difficulty of Paying Living Expenses: Not on file  Food Insecurity:   . Worried About Charity fundraiser in the Last Year: Not on file  . Ran Out of Food in the Last Year: Not on file  Transportation Needs:   . Lack of Transportation (Medical): Not on file  . Lack of Transportation (Non-Medical): Not on file  Physical Activity:   . Days of Exercise per Week: Not on file  . Minutes of Exercise per Session: Not on file  Stress:   . Feeling of Stress : Not on file  Social Connections:   . Frequency of Communication with Friends and Family: Not on file  . Frequency of Social Gatherings with Friends and Family: Not on file  . Attends Religious Services: Not on file  . Active Member of Clubs or Organizations: Not on file  . Attends Archivist Meetings: Not on file  . Marital Status: Not on file  Intimate Partner Violence:   . Fear of Current or Ex-Partner: Not on file  . Emotionally Abused: Not on file  . Physically Abused: Not on file  . Sexually Abused: Not on file  :  Review of Systems: A comprehensive 14 point review of systems was negative except as  noted in the HPI.  Exam: Patient Vitals for the past 24 hrs:  BP Temp Temp src Pulse Resp SpO2  07/07/20 1050 (!) 144/64 98 F (36.7 C) Oral (!) 57 13 100 %    General:  well-nourished in no acute distress.   Eyes:  no scleral icterus.   ENT:  There were no oropharyngeal lesions.   Neck was without thyromegaly.   Lymphatics:  Negative cervical, supraclavicular or axillary adenopathy.   Respiratory: lungs were clear bilaterally without wheezing or crackles.   Cardiovascular:  Regular rate and rhythm, S1/S2, without murmur, rub or gallop.  There was no pedal  edema.   GI:  Positive bowel sounds.  I was unable to palpate the mass in the left upper quadrant today.  Musculoskeletal:  no spinal tenderness of palpation of vertebral spine.   Skin exam was without echymosis, petichae.   Neuro exam was nonfocal. Patient was alert and oriented.  Attention was good.   Language was appropriate.  Mood was normal without depression.  Speech was not pressured.  Thought content was not tangential.     Lab Results  Component Value Date   WBC 6.0 06/23/2020   HGB 9.6 (L) 06/23/2020   HCT 28.8 (L) 06/23/2020   PLT 325 06/23/2020   GLUCOSE 120 (H) 06/23/2020   CHOL  12/31/2009    160        ATP III CLASSIFICATION:  <200     mg/dL   Desirable  200-239  mg/dL   Borderline High  >=240    mg/dL   High          TRIG 213 (H) 12/31/2009   HDL 28 (L) 12/31/2009   LDLCALC  12/31/2009    89        Total Cholesterol/HDL:CHD Risk Coronary Heart Disease Risk Table                     Men   Women  1/2 Average Risk   3.4   3.3  Average Risk       5.0   4.4  2 X Average Risk   9.6   7.1  3 X Average Risk  23.4   11.0        Use the calculated Patient Ratio above and the CHD Risk Table to determine the patient's CHD Risk.        ATP III CLASSIFICATION (LDL):  <100     mg/dL   Optimal  100-129  mg/dL   Near or Above                    Optimal  130-159  mg/dL   Borderline  160-189  mg/dL   High  >190     mg/dL   Very High   ALT 40 06/23/2020   AST 16 06/23/2020   NA 140 06/23/2020   K 3.6 06/23/2020   CL 104 06/23/2020   CREATININE 0.99 06/23/2020   BUN 25 (H) 06/23/2020   CO2 28 06/23/2020    No results found.   No results found.  Assessment and Plan:  Connor Adams 74 y.o. male with medical history significant for Stage I DLBCL who presents for a follow up visit.  After review the labs, review the imaging, review the pathology and discussion with the patient the findings are most consistent with a stage I diffuse large B-cell lymphoma  predominantly involving the lymph nodes of the abdomen, double hit lymphoma  with rearrangements of BCL-2 and MYC. Recommend for him to proceed with EPOCH-R as an inpatient.  He is here to begin cycle #5 today.   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 Stage I bulky disease.   --Recommend EPOCH-R -begin cycle #5 on 08/18/2020. --Adverse effects have been reviewed including, but not limited to, alopecia, myelosuppression, peripheral neuropathy, mucositis, liver/renal dysfunction, and possible infusion reaction associated with Rituxan. He agrees to proceed. --PAC in place and ready for use. --Echocardiogram from 05/22/2020 shows LVEF of 60-65%  --CBC with differential, CMET, LDH, and uric acid from today have been reviewed and are adequate for treatment. --Will monitor closely with daily CBC with diff, CMET, LDH, and uric acid --Lovenox for DVT prophylaxis. --Continue home medications for HTN and hyperlipidemia.  --Continue PRN antiemetics. --He has scheduled MiraLAX daily and Senokot-S 2 tablets twice a day.   The patient is scheduled for lab, follow-up visit, Rituxan and Neulasta on Monday, 08/25/2020.   [name], DNP, AGPCNP-BC, AOCNP  I have read the above note and personally examined the patient. I agree with the assessment and plan as noted above.  Briefly Mr. Joslin is a 74 year old male who presents for admission for scheduled Cycle 5 of R-EPOCH chemotherapy for double hit DLBCL.  Today Mr. Hanken reports that he feels quite well.  He has not had any nausea, vomiting, or diarrhea since we last saw him in clinic following cycle 4 of treatment.  He notes he has not had any cough or shortness of breath.  Overall he feels well and is willing and able to proceed with treatment today.  Labs appear stable other than a hemoglobin of 8.6.  Unfortunately  his interval PET CT scan performed last Friday showed evidence of new pulmonary nodules.  The primary mass in his abdomen responded well to the chemotherapy treatment and is now a Deauville Score 3 (implying complete response to treatment).  These new pulmonary nodules have a wide differential including progressive metastatic disease (far less likely), atypical bacterial or fungal infection, or new lung primary.  Given these findings we will make a referral to pulmonary to review the imaging and make recommendations regarding these new lung nodules.   [name], MD Department of Hematology/Oncology [facility] Phone: [phone] Pager: [phone] Email: [email or web address]

## 2020-08-18 ENCOUNTER — Encounter (HOSPITAL_COMMUNITY): Payer: Self-pay | Admitting: Hematology and Oncology

## 2020-08-18 ENCOUNTER — Inpatient Hospital Stay (HOSPITAL_COMMUNITY)
Admission: AD | Admit: 2020-08-18 | Discharge: 2020-08-22 | DRG: 847 | Disposition: A | Discharge status: Home / Self Care | Payer: Medicare PPO | Source: Ambulatory Visit | Attending: Hematology and Oncology | Admitting: Hematology and Oncology

## 2020-08-18 ENCOUNTER — Other Ambulatory Visit: Payer: Self-pay | Admitting: Hematology and Oncology

## 2020-08-18 ENCOUNTER — Other Ambulatory Visit: Payer: Self-pay

## 2020-08-18 DIAGNOSIS — N183 Chronic kidney disease, stage 3 unspecified: Secondary | ICD-10-CM | POA: Diagnosis present

## 2020-08-18 DIAGNOSIS — C8338 Diffuse large B-cell lymphoma, lymph nodes of multiple sites: Secondary | ICD-10-CM | POA: Diagnosis not present

## 2020-08-18 DIAGNOSIS — Z8616 Personal history of COVID-19: Secondary | ICD-10-CM | POA: Diagnosis not present

## 2020-08-18 DIAGNOSIS — Z20822 Contact with and (suspected) exposure to covid-19: Secondary | ICD-10-CM | POA: Diagnosis present

## 2020-08-18 DIAGNOSIS — C8333 Diffuse large B-cell lymphoma, intra-abdominal lymph nodes: Secondary | ICD-10-CM | POA: Diagnosis present

## 2020-08-18 DIAGNOSIS — E785 Hyperlipidemia, unspecified: Secondary | ICD-10-CM | POA: Diagnosis present

## 2020-08-18 DIAGNOSIS — K219 Gastro-esophageal reflux disease without esophagitis: Secondary | ICD-10-CM | POA: Diagnosis present

## 2020-08-18 DIAGNOSIS — R918 Other nonspecific abnormal finding of lung field: Secondary | ICD-10-CM | POA: Diagnosis present

## 2020-08-18 DIAGNOSIS — Z96653 Presence of artificial knee joint, bilateral: Secondary | ICD-10-CM | POA: Diagnosis present

## 2020-08-18 DIAGNOSIS — Z85828 Personal history of other malignant neoplasm of skin: Secondary | ICD-10-CM | POA: Diagnosis not present

## 2020-08-18 DIAGNOSIS — I129 Hypertensive chronic kidney disease with stage 1 through stage 4 chronic kidney disease, or unspecified chronic kidney disease: Secondary | ICD-10-CM | POA: Diagnosis present

## 2020-08-18 DIAGNOSIS — Z5111 Encounter for antineoplastic chemotherapy: Secondary | ICD-10-CM | POA: Diagnosis not present

## 2020-08-18 DIAGNOSIS — Z9221 Personal history of antineoplastic chemotherapy: Secondary | ICD-10-CM

## 2020-08-18 DIAGNOSIS — Z8673 Personal history of transient ischemic attack (TIA), and cerebral infarction without residual deficits: Secondary | ICD-10-CM | POA: Diagnosis not present

## 2020-08-18 DIAGNOSIS — Z79899 Other long term (current) drug therapy: Secondary | ICD-10-CM | POA: Diagnosis not present

## 2020-08-18 DIAGNOSIS — D72819 Decreased white blood cell count, unspecified: Secondary | ICD-10-CM | POA: Diagnosis not present

## 2020-08-18 LAB — COMPREHENSIVE METABOLIC PANEL
ALT: 26 U/L (ref 0–44)
AST: 22 U/L (ref 15–41)
Albumin: 3.7 g/dL (ref 3.5–5.0)
Alkaline Phosphatase: 69 U/L (ref 38–126)
Anion gap: 10 (ref 5–15)
BUN: 23 mg/dL (ref 8–23)
CO2: 24 mmol/L (ref 22–32)
Calcium: 9.1 mg/dL (ref 8.9–10.3)
Chloride: 108 mmol/L (ref 98–111)
Creatinine, Ser: 1.05 mg/dL (ref 0.61–1.24)
GFR, Estimated: >60 mL/min (ref >60)
Glucose, Bld: 115 mg/dL — ABNORMAL HIGH (ref 70–99)
Potassium: 3.8 mmol/L (ref 3.5–5.1)
Sodium: 142 mmol/L (ref 135–145)
Total Bilirubin: 0.2 mg/dL — ABNORMAL LOW (ref 0.3–1.2)
Total Protein: 6.5 g/dL (ref 6.5–8.1)

## 2020-08-18 LAB — CBC WITH DIFFERENTIAL/PLATELET
Abs Immature Granulocytes: 0.18 10*3/uL — ABNORMAL HIGH (ref 0.00–0.07)
Basophils Absolute: 0.1 10*3/uL (ref 0.0–0.1)
Basophils Relative: 1 %
Eosinophils Absolute: 0 10*3/uL (ref 0.0–0.5)
Eosinophils Relative: 0 %
HCT: 28.1 % — ABNORMAL LOW (ref 39.0–52.0)
Hemoglobin: 8.6 g/dL — ABNORMAL LOW (ref 13.0–17.0)
Immature Granulocytes: 2 %
Lymphocytes Relative: 3 %
Lymphs Abs: 0.3 10*3/uL — ABNORMAL LOW (ref 0.7–4.0)
MCH: 28.5 pg (ref 26.0–34.0)
MCHC: 30.6 g/dL (ref 30.0–36.0)
MCV: 93 fL (ref 80.0–100.0)
Monocytes Absolute: 1 10*3/uL (ref 0.1–1.0)
Monocytes Relative: 10 %
Neutro Abs: 8.4 10*3/uL — ABNORMAL HIGH (ref 1.7–7.7)
Neutrophils Relative %: 84 %
Platelets: 342 10*3/uL (ref 150–400)
RBC: 3.02 MIL/uL — ABNORMAL LOW (ref 4.22–5.81)
RDW: 20.3 % — ABNORMAL HIGH (ref 11.5–15.5)
WBC: 9.9 10*3/uL (ref 4.0–10.5)
nRBC: 0 % (ref 0.0–0.2)

## 2020-08-18 LAB — LACTATE DEHYDROGENASE: LDH: 182 U/L (ref 98–192)

## 2020-08-18 LAB — URIC ACID: Uric Acid, Serum: 6.7 mg/dL (ref 3.7–8.6)

## 2020-08-18 MED ORDER — SODIUM CHLORIDE 0.9 % IV SOLN
Freq: Once | INTRAVENOUS | Status: AC
  Administered 2020-08-18: 8 mg via INTRAVENOUS
  Filled 2020-08-18: qty 4

## 2020-08-18 MED ORDER — SODIUM CHLORIDE 0.9% FLUSH: 10.0000 mL | INTRAVENOUS | Status: DC | PRN

## 2020-08-18 MED ORDER — PROCHLORPERAZINE MALEATE 10 MG PO TABS: 10.0000 mg | ORAL_TABLET | Freq: Four times a day (QID) | ORAL | Status: DC | PRN

## 2020-08-18 MED ORDER — PREDNISONE 50 MG PO TABS
120.0000 mg | ORAL_TABLET | Freq: Every day | ORAL | Status: AC
  Administered 2020-08-19 – 2020-08-22 (×4): 120 mg via ORAL
  Filled 2020-08-18 (×5): qty 1

## 2020-08-18 MED ORDER — SODIUM CHLORIDE 0.9 % IV SOLN: INTRAVENOUS | Status: DC

## 2020-08-18 MED ORDER — PREDNISONE 20 MG PO TABS
60.0000 mg | ORAL_TABLET | Freq: Every day | ORAL | Status: DC
  Administered 2020-08-18: 60 mg via ORAL
  Filled 2020-08-18: qty 3

## 2020-08-18 MED ORDER — SUCRALFATE 1 G PO TABS
1.0000 g | ORAL_TABLET | Freq: Three times a day (TID) | ORAL | Status: DC
  Administered 2020-08-18 – 2020-08-22 (×15): 1 g via ORAL
  Filled 2020-08-18 (×15): qty 1

## 2020-08-18 MED ORDER — PANTOPRAZOLE SODIUM 40 MG PO TBEC
40.0000 mg | DELAYED_RELEASE_TABLET | Freq: Two times a day (BID) | ORAL | Status: DC
  Administered 2020-08-18 – 2020-08-22 (×8): 40 mg via ORAL
  Filled 2020-08-18 (×8): qty 1

## 2020-08-18 MED ORDER — ROSUVASTATIN CALCIUM 5 MG PO TABS
5.0000 mg | ORAL_TABLET | Freq: Every day | ORAL | Status: DC
  Administered 2020-08-19 – 2020-08-22 (×4): 5 mg via ORAL
  Filled 2020-08-18 (×4): qty 1

## 2020-08-18 MED ORDER — SODIUM BICARBONATE/SODIUM CHLORIDE MOUTHWASH
1.0000 "application " | Freq: Four times a day (QID) | OROMUCOSAL | Status: DC
  Administered 2020-08-18 – 2020-08-22 (×14): 1 via OROMUCOSAL
  Filled 2020-08-18: qty 1000

## 2020-08-18 MED ORDER — ENOXAPARIN SODIUM 40 MG/0.4ML ~~LOC~~ SOLN
40.0000 mg | SUBCUTANEOUS | Status: DC
  Administered 2020-08-18 – 2020-08-22 (×5): 40 mg via SUBCUTANEOUS
  Filled 2020-08-18 (×5): qty 0.4

## 2020-08-18 MED ORDER — PREDNISONE 20 MG PO TABS
60.0000 mg | ORAL_TABLET | Freq: Once | ORAL | Status: AC
  Administered 2020-08-18: 60 mg via ORAL
  Filled 2020-08-18: qty 3

## 2020-08-18 MED ORDER — ONDANSETRON 4 MG PO TBDP: 8.0000 mg | ORAL_TABLET | Freq: Three times a day (TID) | ORAL | Status: DC | PRN

## 2020-08-18 MED ORDER — CHLORHEXIDINE GLUCONATE CLOTH 2 % EX PADS
6.0000 | MEDICATED_PAD | Freq: Every day | CUTANEOUS | Status: DC
  Administered 2020-08-18 – 2020-08-22 (×4): 6 via TOPICAL

## 2020-08-18 MED ORDER — ADULT MULTIVITAMIN W/MINERALS CH
1.0000 | ORAL_TABLET | Freq: Every day | ORAL | Status: DC
  Administered 2020-08-19 – 2020-08-22 (×4): 1 via ORAL
  Filled 2020-08-18 (×4): qty 1

## 2020-08-18 MED ORDER — SENNOSIDES-DOCUSATE SODIUM 8.6-50 MG PO TABS
2.0000 | ORAL_TABLET | Freq: Two times a day (BID) | ORAL | Status: DC
  Administered 2020-08-18 – 2020-08-22 (×8): 2 via ORAL
  Filled 2020-08-18 (×8): qty 2

## 2020-08-18 MED ORDER — LOSARTAN POTASSIUM 50 MG PO TABS
25.0000 mg | ORAL_TABLET | Freq: Every day | ORAL | Status: DC
  Administered 2020-08-19 – 2020-08-22 (×4): 25 mg via ORAL
  Filled 2020-08-18 (×4): qty 1

## 2020-08-18 MED ORDER — POLYVINYL ALCOHOL 1.4 % OP SOLN
1.0000 [drp] | OPHTHALMIC | Status: DC | PRN
  Filled 2020-08-18: qty 15

## 2020-08-18 MED ORDER — MAGIC MOUTHWASH W/LIDOCAINE
5.0000 mL | Freq: Four times a day (QID) | ORAL | Status: DC
  Administered 2020-08-18 – 2020-08-20 (×3): 5 mL via ORAL
  Filled 2020-08-18 (×19): qty 5

## 2020-08-18 MED ORDER — ENSURE ENLIVE PO LIQD
237.0000 mL | Freq: Two times a day (BID) | ORAL | Status: DC
  Administered 2020-08-18 – 2020-08-22 (×8): 237 mL via ORAL

## 2020-08-18 MED ORDER — VINCRISTINE SULFATE CHEMO INJECTION 1 MG/ML
Freq: Once | INTRAVENOUS | Status: AC
  Filled 2020-08-18: qty 14

## 2020-08-18 MED ORDER — POLYETHYLENE GLYCOL 3350 17 G PO PACK
17.0000 g | PACK | Freq: Every day | ORAL | Status: DC
  Administered 2020-08-18 – 2020-08-19 (×2): 17 g via ORAL
  Filled 2020-08-18 (×2): qty 1

## 2020-08-18 MED ORDER — HYDROCORTISONE 2.5 % RE CREA
1.0000 "application " | TOPICAL_CREAM | Freq: Every day | RECTAL | Status: DC | PRN
  Filled 2020-08-18: qty 28.35

## 2020-08-18 MED ORDER — SODIUM CHLORIDE 0.9% FLUSH
10.0000 mL | Freq: Two times a day (BID) | INTRAVENOUS | Status: DC
  Administered 2020-08-18 – 2020-08-21 (×6): 10 mL
  Administered 2020-08-22: 20 mL

## 2020-08-18 MED ORDER — ACETAMINOPHEN 325 MG PO TABS: 650.0000 mg | ORAL_TABLET | Freq: Four times a day (QID) | ORAL | Status: DC | PRN

## 2020-08-18 MED ORDER — FLUTICASONE PROPIONATE 50 MCG/ACT NA SUSP
1.0000 | Freq: Every day | NASAL | Status: DC | PRN
  Filled 2020-08-18: qty 16

## 2020-08-18 NOTE — Progress Notes (Signed)
Per MD, plans for this cycle of EPOCH to be increased by 20%. Orders updated and signed by provider. MD also states patient should be receiving 60 mg/m2 of prednisone x5 days, orders updated to refect this dose.   Eddie Candle, PharmD, BCPS PGY-2 Hematology/Oncology Pharmacy Resident

## 2020-08-18 NOTE — Consult Note (Addendum)
NAME:  Connor Adams, MRN:  [id number], DOB:  1946-03-02, LOS: 0 ADMISSION DATE:  08/18/2020, CONSULTATION DATE:  08/18/2020  REFERRING MD:  Orson Slick, MD, CHIEF COMPLAINT:  Pulmonary nodules  History of present illness   Connor Adams is a 74 y.o. 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  Staging CT scan shows multiple  Pulmonary nodules which are new since July for which we are consulted. Otherwise has been tolerating chemotherapy fairly well. In between cycles has good energy. No constipation, no further mucositis.   Social Hx: Never smoker, passive smoke exposure through spouse. 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 Lots of exposure to cars, engines and brakes. 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 Has a pet dog at home. Has lived in [place] for over 40 years.   Past Medical History  He,  has a past medical history of Arthritis, Chronic kidney disease (CKD), stage III (moderate) ([place]), COVID-19, GERD (gastroesophageal reflux disease), Hemorrhoids, History of ETT (3/05), History of hiatal hernia, History of kidney stones, HLD (hyperlipidemia), HTN (hypertension), Left inguinal hernia, Melanoma ([place]), Sciatica, Stroke ([place]) (2011), and TIA (transient ischemic attack) (4/11).   Consults:  PCCM    Procedures:    Significant Diagnostic Tests:  PET scan 11/27 shows reduction in interval size of primary abdominal mass but several new pulmonary nodules  Micro Data:    Antimicrobials:     Objective   Blood pressure 121/69, pulse 66, temperature 98.1 F (36.7 C), temperature source Oral, resp. rate 17, height 5\' 8"  (1.727 m), weight 74.4 kg, SpO2 99 %.       No intake or output data in the 24 hours ending 08/18/20 1402 Filed Weights   08/18/20 1050  Weight: 74.4 kg    Examination: General: alopecia, no acute distress HENT:  diffuse erythema on face, no oral mucositis Lungs: ctab no wheezes or crackles Cardiovascular: RRR Abdomen: soft, nontender Extremities: no edema Neuro: normal speech no focal asymmetry AOx3 MSK: ecchymoses noted on upper extremities   Resolved Hospital Problem list     Assessment & Plan:  Connor Adams is a 74 y.o. gentleman with history of DLBCL admitted for R-EPOCH cycle 5. During his re-staging scan he has developed interval pulmonary nodules for which we are consulted today.  DLBCL - on R-EPOCH Cycle 5 Multiple pulmonary nodules He has had improvement of his primary mass with treatment.  Given this improvement with no new worsening respiratory or constitutional symptoms that would otherwise be explained by his treatment, I would follow these nodules with a repeat CT at 2 months. They are currently too small with not enough of a solid component to make a CT-guided biopsy worthwhile.   Bronchoscopy would not currently be helpful here.   This would be a very unusual presentation for primary lung malignancy but given their pet avidity, random distribution suggestive of hematogenous spread, metastasis is high on my differential. Not classic for appearance of opportunistic infection such as nocardia, aspergillosis, pneumocystis. Discussed with Dr. Lorenso Courier, his plan is for repeat imaging in 6 weeks and he will reach out to pulmonary based on those findings for additional recommendations.   If they are enlarging at repeat CT scan can make a case for navigational bronchoscopy.   Lenice Llamas, MD Pulmonary and [place] Pager: [place]  Labs   CBC: Recent Labs  Lab 08/18/20 1008  WBC 9.9  NEUTROABS 8.4*  HGB 8.6*  HCT 28.1*  MCV 93.0  PLT 644    Basic Metabolic Panel: Recent Labs  Lab 08/18/20 1008  NA 142  K 3.8  CL 108  CO2 24  GLUCOSE 115*  BUN 23  CREATININE 1.05  CALCIUM 9.1   GFR: Estimated Creatinine  Clearance: 59.7 mL/min (by C-G formula based on SCr of 1.05 mg/dL). Recent Labs  Lab 08/18/20 1008  WBC 9.9    Liver Function Tests: Recent Labs  Lab 08/18/20 1008  AST 22  ALT 26  ALKPHOS 69  BILITOT 0.2*  PROT 6.5  ALBUMIN 3.7   No results for input(s): LIPASE, AMYLASE in the last 168 hours. No results for input(s): AMMONIA in the last 168 hours.  ABG    Component Value Date/Time   TCO2 27 08/22/2011 0625     Coagulation Profile: No results for input(s): INR, PROTIME in the last 168 hours.  Cardiac Enzymes: No results for input(s): CKTOTAL, CKMB, CKMBINDEX, TROPONINI in the last 168 hours.  HbA1C: Hgb A1c MFr Bld  Date/Time Value Ref Range Status  01/01/2010 12:07 AM (H) <5.7 % Final   6.2 (NOTE)                                                                       According to the ADA Clinical Practice Recommendations for 2011, when HbA1c is used as a screening test:   >=6.5%   Diagnostic of Diabetes Mellitus           (if abnormal result  is confirmed)  5.7-6.4%   Increased risk of developing Diabetes Mellitus  References:Diagnosis and Classification of Diabetes Mellitus,Diabetes Care,2011,34(Suppl 1):S62-S69 and Standards of Medical Care in         Diabetes - 2011,Diabetes IHKV,4259,56  (Suppl 1):S11-S61.  12/31/2009 03:45 PM (H) <5.7 % Final   6.2 (NOTE)                                                                       According to the ADA Clinical Practice Recommendations for 2011, when HbA1c is used as a screening test:   >=6.5%   Diagnostic of Diabetes Mellitus           (if abnormal result  is confirmed)  5.7-6.4%   Increased risk of developing Diabetes Mellitus  References:Diagnosis and Classification of Diabetes Mellitus,Diabetes LOVF,6433,29(JJOAC 1):S62-S69 and Standards of Medical Care in         Diabetes - 2011,Diabetes Care,2011,34  (Suppl 1):S11-S61.    CBG: Recent Labs  Lab 08/15/20 1430  GLUCAP 83    Review of Systems:   - fevers,  chills, night sweats - hemoptysis, dyspnea - chest pain - diarrhea +constipation   Surgical History    Past Surgical History:  Procedure Laterality Date  . BMI 30.2 muscular  12/08/04  . curretage actinic keratosis R ear  01/20/05  . ETT  low prob of CAD  11/21/03  . excision melanoma in situ back  01/20/05  . hemorrhoids sclerosed at colonoscopy  12/19/05  . IR IMAGING GUIDED PORT INSERTION  05/19/2020  . LAPAROSCOPIC INGUINAL HERNIA REPAIR Left 12/20/1995  . LUMBAR DISC SURGERY    . NERVE REPAIR     back of neck  . retinal laser surgery Left 09/20/1986  . SEPTOPLASTY  11/19/98  . TOTAL KNEE ARTHROPLASTY Left 12/13/2016   Procedure: TOTAL KNEE ARTHROPLASTY WITH RIGHT KNEE CORTISONE INJECTION;  Surgeon: Frederik Pear, MD;  Location: [facility];  Service: Orthopedics;  Laterality: Left;  . uric acid 6.9  12/22/04  . x-ray l great toe  07/21/00     Social History   reports that he has never smoked. He has never used smokeless tobacco. He reports that he does not drink alcohol and does not use drugs.   Family History   His family history includes Asthma in his son; Diabetes in his brother; Heart attack (age of onset: 39) in his father; Heart attack (age of onset: 52) in his mother; Heart failure (age of onset: 17) in his brother; Liver cancer in his sister; Prostate cancer in his brother; Uterine cancer in his mother.   Allergies No Known Allergies   Home Medications  Prior to Admission medications   Medication Sig Start Date End Date Taking? Authorizing Provider  feeding supplement, ENSURE ENLIVE, (ENSURE ENLIVE) LIQD Take 237 mLs by mouth 2 (two) times daily between meals. 06/20/20  Yes Curcio, Roselie Awkward, NP  hydrocortisone (ANUSOL-HC) 2.5 % rectal cream Place 1 application rectally daily as needed. Patient taking differently: Place 1 application rectally daily.  05/30/20  Yes Curcio, Roselie Awkward, NP  losartan (COZAAR) 25 MG tablet Take 25 mg by mouth daily.  04/09/20  Yes [provider]    magic mouthwash w/lidocaine SOLN Take 5 mLs by mouth 4 (four) times daily. Lidocaine 80 mls; benadryl liquid 80 mls; maalox 80 mls Patient taking differently: Take 5 mLs by mouth 4 (four) times daily as needed for mouth pain. Lidocaine 80 mls; benadryl liquid 80 mls; maalox 80 mls 08/07/20  Yes Orson Slick, MD  Multiple Vitamins-Minerals (MULTIVITAMINS THER. W/MINERALS) TABS Take 1 tablet by mouth daily.     Yes [provider]  ondansetron (ZOFRAN ODT) 8 MG disintegrating tablet Take 1 tablet (8 mg total) by mouth every 8 (eight) hours as needed for nausea or vomiting. 05/13/20  Yes Tanner, Lyndon Code., PA-C  pantoprazole (PROTONIX) 40 MG tablet Take 1 tablet (40 mg total) by mouth 2 (two) times daily. 05/13/20  Yes Tanner, Lyndon Code., PA-C  polyethylene glycol (MIRALAX / GLYCOLAX) 17 g packet Take 17 g by mouth daily as needed for moderate constipation. 05/30/20  Yes Curcio, Roselie Awkward, NP  polyvinyl alcohol (LIQUIFILM TEARS) 1.4 % ophthalmic solution Place 1 drop into both eyes as needed for dry eyes.   Yes [provider]  prochlorperazine (COMPAZINE) 10 MG tablet Take 1 tablet (10 mg total) by mouth every 6 (six) hours as needed for nausea or vomiting. 05/15/20  Yes Orson Slick, MD  promethazine (PHENERGAN) 25 MG tablet Take 1 tablet (25 mg total) by mouth every 6 (six) hours as needed for nausea or vomiting. 05/13/20  Yes Tanner, Lyndon Code., PA-C  rosuvastatin (CRESTOR) 5 MG tablet Take 5 mg by mouth daily.    Yes [provider]  senna-docusate (SENOKOT-S) 8.6-50 MG tablet Take 2 tablets by mouth 2 (two)  times daily as needed for mild constipation. 05/30/20  Yes Curcio, Roselie Awkward, NP  Sodium Chloride-Sodium Bicarb (SODIUM BICARBONATE/SODIUM CHLORIDE) SOLN 1 application by Mouth Rinse route as needed for dry mouth. 05/30/20  Yes Curcio, Roselie Awkward, NP  sucralfate (CARAFATE) 1 g tablet Take 1 tablet (1 g total) by mouth 4 (four) times daily -  with meals and at bedtime. 05/20/20   Yes Nandigam, Venia Minks, MD  acetaminophen (TYLENOL) 325 MG tablet Take 2 tablets (650 mg total) by mouth every 6 (six) hours as needed for mild pain, moderate pain or fever. Patient not taking: Reported on 08/18/2020 06/20/20   Maryanna Shape, NP  predniSONE (DELTASONE) 20 MG tablet Take 1 tablet (20 mg total) by mouth daily with breakfast. Pt to take 6 tablets the morning of 08/18/20, prior to hospital admission for chemotherapy 08/15/20   Orson Slick, MD

## 2020-08-18 NOTE — Addendum Note (Signed)
Addended by: Tora Kindred on: 08/18/2020 12:13 PM   Modules accepted: Orders

## 2020-08-19 DIAGNOSIS — D72819 Decreased white blood cell count, unspecified: Secondary | ICD-10-CM

## 2020-08-19 DIAGNOSIS — C8338 Diffuse large B-cell lymphoma, lymph nodes of multiple sites: Secondary | ICD-10-CM | POA: Diagnosis not present

## 2020-08-19 LAB — CBC WITH DIFFERENTIAL/PLATELET
Abs Immature Granulocytes: 0.26 K/uL — ABNORMAL HIGH (ref 0.00–0.07)
Basophils Absolute: 0 K/uL (ref 0.0–0.1)
Basophils Relative: 0 %
Eosinophils Absolute: 0 K/uL (ref 0.0–0.5)
Eosinophils Relative: 0 %
HCT: 25 % — ABNORMAL LOW (ref 39.0–52.0)
Hemoglobin: 7.6 g/dL — ABNORMAL LOW (ref 13.0–17.0)
Immature Granulocytes: 2 %
Lymphocytes Relative: 2 %
Lymphs Abs: 0.3 K/uL — ABNORMAL LOW (ref 0.7–4.0)
MCH: 28.6 pg (ref 26.0–34.0)
MCHC: 30.4 g/dL (ref 30.0–36.0)
MCV: 94 fL (ref 80.0–100.0)
Monocytes Absolute: 0.5 K/uL (ref 0.1–1.0)
Monocytes Relative: 3 %
Neutro Abs: 14.1 K/uL — ABNORMAL HIGH (ref 1.7–7.7)
Neutrophils Relative %: 93 %
Platelets: 346 K/uL (ref 150–400)
RBC: 2.66 MIL/uL — ABNORMAL LOW (ref 4.22–5.81)
RDW: 20.3 % — ABNORMAL HIGH (ref 11.5–15.5)
WBC: 15.1 K/uL — ABNORMAL HIGH (ref 4.0–10.5)
nRBC: 0 % (ref 0.0–0.2)

## 2020-08-19 LAB — COMPREHENSIVE METABOLIC PANEL
ALT: 22 U/L (ref 0–44)
AST: 18 U/L (ref 15–41)
Albumin: 3.2 g/dL — ABNORMAL LOW (ref 3.5–5.0)
Alkaline Phosphatase: 57 U/L (ref 38–126)
Anion gap: 8 (ref 5–15)
BUN: 24 mg/dL — ABNORMAL HIGH (ref 8–23)
CO2: 24 mmol/L (ref 22–32)
Calcium: 9 mg/dL (ref 8.9–10.3)
Chloride: 110 mmol/L (ref 98–111)
Creatinine, Ser: 1.01 mg/dL (ref 0.61–1.24)
GFR, Estimated: >60 mL/min (ref >60)
Glucose, Bld: 164 mg/dL — ABNORMAL HIGH (ref 70–99)
Potassium: 4.1 mmol/L (ref 3.5–5.1)
Sodium: 142 mmol/L (ref 135–145)
Total Bilirubin: 0.3 mg/dL (ref 0.3–1.2)
Total Protein: 5.5 g/dL — ABNORMAL LOW (ref 6.5–8.1)

## 2020-08-19 LAB — URIC ACID: Uric Acid, Serum: 5.8 mg/dL (ref 3.7–8.6)

## 2020-08-19 LAB — LACTATE DEHYDROGENASE: LDH: 141 U/L (ref 98–192)

## 2020-08-19 MED ORDER — SODIUM CHLORIDE 0.9 % IV SOLN
Freq: Once | INTRAVENOUS | Status: AC
  Administered 2020-08-19: 8 mg via INTRAVENOUS
  Filled 2020-08-19: qty 4

## 2020-08-19 MED ORDER — PROCHLORPERAZINE MALEATE 10 MG PO TABS: 10.0000 mg | ORAL_TABLET | Freq: Four times a day (QID) | ORAL | Status: DC | PRN

## 2020-08-19 MED ORDER — VINCRISTINE SULFATE CHEMO INJECTION 1 MG/ML
Freq: Once | INTRAVENOUS | Status: AC
  Filled 2020-08-19: qty 14

## 2020-08-19 MED ORDER — POLYETHYLENE GLYCOL 3350 17 G PO PACK
17.0000 g | PACK | Freq: Two times a day (BID) | ORAL | Status: DC | PRN
  Administered 2020-08-20 – 2020-08-21 (×3): 17 g via ORAL
  Filled 2020-08-19 (×3): qty 1

## 2020-08-19 NOTE — Progress Notes (Signed)
Per Lamont Snowball, NP ok to treat today w/ Hgb = 7.6 g/dL. Plan to transfuse if Hgb < 7 g/dL  Kennith Center, Pharm.D., CPP 08/19/2020@10 :08 AM

## 2020-08-19 NOTE — Progress Notes (Signed)
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   1) 04/17/2020: CT A/P showed dominant nodal mass(11.5 x 10.9 cm)within the jejunal mesentery, highly suspicious for lymphoma. Additionally there are left lower lobe pleural-based pulmonary nodules 2) 04/24/2020: establish care with Dr. Lorenso Courier 3) 04/28/2020:  PET CT scan shows Large solid left mesenteric mass 13.0 cm with maximum SUV of 21.2, [place] 5 with local hypermetabolic porta hepatis and retroperitoneal lymph nodes. Constitutes bulky Stage I disease.   Reason for Admission: double hit lymphoma, Cycle #5 EPOCH-R  HPI: Connor Adams is a 74 year old male with medical history significant for double hit DLBCL. He presents for Cycle 4 of R-EPOCH chemotherapy. Risks and benefits have been discussed and he has agreed to proceed  Interval History: The patient tolerated day one of his chemotherapy well overall.  He got about 4 to 5 hours of sleep last night.  Denies mucositis, nausea, vomiting.  Bowels are moving without any difficulty.  Denies fevers, chills, cough, shortness of breath.  He continues to maintain a good energy level and is ambulating in the hallway without any difficulty.  Was evaluated by pulmonology for his pulmonary nodules and they recommend repeat imaging in about 6 weeks.   Diagnosis Date  . Arthritis   . Chronic kidney disease (CKD), stage III (moderate)   . COVID-19   . GERD (gastroesophageal reflux disease)    occ  . Hemorrhoids   . History of ETT 3/05   low risk  . History of hiatal hernia   . History of kidney stones   . HLD (hyperlipidemia)   . HTN (hypertension)   . Left inguinal hernia   . Melanoma ([place])    excied  . 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  Stroke [facility]) 2011   tia   . TIA (transient ischemic attack) 4/11   associated Adams slurred speecha ndmild facial droop. lasted for about 10 minutes. Had MRI, etc with [facility] that per his report was ok (dr. Stephannie Li). Echo (4/11): EF 55-60%, no regional WMAs, normal diastolic function, mild LAE, no source of embolus  Past Surgical History:  Procedure Laterality Date  . BMI 30.2 muscular  12/08/04  . curretage actinic keratosis R ear  01/20/05  . ETT low prob of CAD  11/21/03  . excision melanoma in situ back  01/20/05  . hemorrhoids sclerosed at colonoscopy  12/19/05  . IR IMAGING GUIDED PORT INSERTION  05/19/2020  . LAPAROSCOPIC INGUINAL HERNIA REPAIR Left 12/20/1995  . LUMBAR DISC SURGERY    . NERVE REPAIR     back of neck  . retinal laser surgery Left 09/20/1986  . SEPTOPLASTY  11/19/98  . TOTAL KNEE ARTHROPLASTY Left 12/13/2016   Procedure: TOTAL KNEE ARTHROPLASTY WITH RIGHT KNEE CORTISONE INJECTION;  Surgeon: Frederik Pear, MD;  Location: [place];  Service: Orthopedics;  Laterality: Left;  . uric acid 6.9  12/22/04  . x-ray l great toe  07/21/00       Fre Past Surgical History:  Procedure Laterality Date  . BMI 30.2 muscular  12/08/04  . curretage actinic keratosis R ear  01/20/05  . ETT low prob of CAD  11/21/03  . excision melanoma in situ back  01/20/05  . hemorrhoids sclerosed at colonoscopy  12/19/05  . IR IMAGING GUIDED PORT INSERTION  05/19/2020  . LAPAROSCOPIC INGUINAL HERNIA REPAIR Left 12/20/1995  . LUMBAR DISC SURGERY    . NERVE REPAIR     back of neck  . retinal laser surgery Left 09/20/1986  . SEPTOPLASTY  11/19/98  . TOTAL KNEE ARTHROPLASTY Left 12/13/2016   Procedure: TOTAL KNEE ARTHROPLASTY WITH RIGHT KNEE CORTISONE INJECTION;  Surgeon: Frederik Pear, MD;  Location: [place];  Service: Orthopedics;  Laterality: Left;  . uric acid 6.9  12/22/04  . x-ray l great toe  07/21/00  quency Provider Last Rate Last Admin  . 0.9 %  sodium chloride infusion   Intravenous Continuous [name] IV, MD 20 mL/hr at 05/29/20 0400 Rate Verify at 05/29/20 0400  . allopurinol (ZYLOPRIM) tablet 300 mg  300 mg Oral Daily [name] R, NP   300 mg at 05/29/20 1038  . aspirin tablet 325 mg  325 mg Oral Q4H PRN Maryanna Shape, NP      . Chlorhexidine Gluconate Cloth 2 % PADS 6 each  6 each Topical Daily Maryanna Shape, NP   6 each at 05/29/20 1038  . Cold Pack 1 packet  1 packet Topical Once PRN [name] IV, MD      . DOXOrubicin (ADRIAMYCIN) 20 mg, etoposide (VEPESID) 96 mg, vinCRIStine (ONCOVIN) 0.8 mg in sodium chloride 0.9 % 1,000 mL chemo infusion   Intravenous Once Orson Slick, MD 51 mL/hr at 05/28/20 1422 New Bag at 05/28/20 1422  . DOXOrubicin (ADRIAMYCIN) 20 mg, etoposide (VEPESID) 96 mg, vinCRIStine (ONCOVIN) 0.8 mg in sodium chloride 0.9 % 1,000 mL chemo infusion   Intravenous Once Narda Rutherford T IV, MD      . enoxaparin (LOVENOX) injection 40 mg  40 mg Subcutaneous Q24H [name] R, NP   40 mg at 05/28/20 1116  . Hot Pack 1 packet  1 packet Topical Once PRN [name] IV, MD      . hydrocortisone (ANUSOL-HC) 2.5 % rectal cream 1 application  1 application Rectal Daily PRN Maryanna Shape, NP      . losartan (COZAAR) tablet 25 mg  25 mg Oral Daily [name] R, NP   25 mg at 05/29/20 1038  . multivitamin with minerals tablet 1 tablet  1 tablet Oral Daily Maryanna Shape, NP   1 tablet at 05/29/20 1038  . ondansetron (ZOFRAN-ODT) disintegrating tablet 8 mg  8 mg Oral Q8H PRN Dietra Stokely R, NP      . pantoprazole (PROTONIX) EC tablet 40 mg  40 mg Oral BID [name] R, NP   40 mg at 05/29/20 1038  . polyethylene glycol (MIRALAX / GLYCOLAX) packet 17 g  17 g Oral Daily PRN Narda Rutherford T IV, MD      . predniSONE (DELTASONE) tablet 120 mg  120 mg Oral Q breakfast [name] IV, MD   120 mg at 05/29/20 0819  . prochlorperazine (COMPAZINE) tablet 10 mg  10 mg Oral Q6H PRN [name] R, NP      . rosuvastatin (CRESTOR) tablet 5 mg  5  mg Oral QPM Vera Wishart R, NP   5 mg at 05/28/20 1744  . senna-docusate (Senokot-S) tablet 2 tablet  2 tablet Oral BID Orson Slick, MD   2 tablet at 05/29/20 1038  . sodium bicarbonate/sodium chloride mouthwash 1052ml   Mouth Rinse PRN Maryanna Shape, NP      .  sodium chloride flush (NS) 0.9 % injection 10-40 mL  10-40 mL Intracatheter Q12H Maryanna Shape, NP   10 mL at 05/28/20 2129  . sodium chloride flush (NS) 0.9 % injection 10-40 mL  10-40 mL Intracatheter PRN Marja Adderley R, NP      . sucralfate (CARAFATE) 1 GM/10ML suspension 1 g  1 g Oral TID WC & HS Sayid Moll, Roselie Awkward, NP   1 g at 05/29/20 9485       Family History  Problem Relation Age of Onset  . Liver cancer Sister   . Heart failure Brother 44       CABGx4  . Heart attack Mother 32  . Uterine cancer Mother   . Heart attack Father 64  . Asthma Son   . Diabetes Brother   . Prostate cancer Brother   Relation Age of Onset  . Liver cancer Sister   . Heart failure Brother 44       CABGx4  . Heart attack Mother 17  . Uterine cancer Mother   . Heart attack Father 25  . Asthma Son   . Diabetes Brother   . Prostate cancer Brother      Socioeconomic History  . Marital status: Married    Spouse name: [name]  . Number of children: 2  . Years of education: 51  . Highest education level: Not on file  Occupational History  . Occupation: Systems developer: [employer]  Tobacco Use  . Smoking status: Never Smoker  . Smokeless tobacco: Never Used  Vaping Use  . Vaping Use: Never used  Substance and Sexual Activity  . Alcohol use: No  . Drug use: No  . Sexual activity: Not on file  Other Topics Concern  . Not on file  Social History Narrative   Married to [employer] at [employer].   Son, [name] married   Son, [name], Married.    Caffeine use: 1 cup coffee per day   Social Determinants of Health   Financial Resource Strain:   . Difficulty of Paying Living Expenses:  Not on file  Food Insecurity:   . Worried About Charity fundraiser in the Last Year: Not on file  . Ran Out of Food in the Last Year: Not on file  Transportation Needs:   . Lack of Transportation (Medical): Not on file  . Lack of Transportation (Non-Medical): Not on file  Physical Activity:   . Days of Exercise per Week: Not on file  . Minutes of Exercise per Session: Not on file  Stress:   . Feeling of Stress : Not on file  Social Connections:   . Frequency of Communication with Friends and Family: Not on file  . Frequency of Social Gatherings with Friends and Family: Not on file  . Attends Religious Services: Not on file  . Active Member of Clubs or Organizations: Not on file  . Attends Archivist Meetings: Not on file  . Marital Status: Not on file  Intimate Partner Violence:   . Fear of Current or Ex-Partner: Not on file  . Emotionally Abused: Not on file  . Physically Abused: Not on file  . Sexually Abused: Not on file  :  Review of Systems: A comprehensive 14 point review of systems was negative except as noted in the HPI.  Exam: Patient Vitals for the past 24 hrs:  BP Temp Temp src Pulse Resp SpO2  05/29/20  0600 107/60 98 F (36.7 C) Oral (!) 56 20 97 %  05/28/20 2222 125/68 (!) 97.4 F (36.3 C) Oral 60 20 98 %  05/28/20 1503 113/75 (!) 97.4 F (36.3 C) Oral (!) 53 18 96 %    General:  well-nourished in no acute distress.   Eyes:  no scleral icterus.   ENT:  There were no oropharyngeal lesions.   Neck was without thyromegaly.   Lymphatics:  Negative cervical, supraclavicular or axillary adenopathy.   Respiratory: lungs were clear bilaterally without wheezing or crackles.   Cardiovascular:  Regular rate and rhythm, S1/S2, without murmur, rub or gallop.  There was no pedal edema.   GI:  Positive bowel sounds.  Left upper quadrant mass not palpable. Musculoskeletal:  no spinal tenderness of palpation of vertebral spine.   Skin exam was without echymosis,  petichae.   Neuro exam was nonfocal. Patient was alert and oriented.  Attention was good.   Language was appropriate.  Mood was normal without depression.  Speech was not pressured.  Thought content was not tangential.     Lab Results  Component Value Date   WBC 17.9 (H) 05/29/2020   HGB 9.8 (L) 05/29/2020   HCT 30.3 (L) 05/29/2020   PLT 351 05/29/2020   GLUCOSE 141 (H) 05/29/2020   CHOL  12/31/2009    160        ATP III CLASSIFICATION:  <200     mg/dL   Desirable  476-546  mg/dL   Borderline High  >=503    mg/dL   High          TRIG 546 (H) 12/31/2009   HDL 28 (L) 12/31/2009   LDLCALC  12/31/2009    89        Total Cholesterol/HDL:CHD Risk Coronary Heart Disease Risk Table                     Men   Women  1/2 Average Risk   3.4   3.3  Average Risk       5.0   4.4  2 X Average Risk   9.6   7.1  3 X Average Risk  23.4   11.0        Use the calculated Patient Ratio above and the CHD Risk Table to determine the patient's CHD Risk.        ATP III CLASSIFICATION (LDL):  <100     mg/dL   Optimal  568-127  mg/dL   Near or Above                    Optimal  130-159  mg/dL   Borderline  517-001  mg/dL   High  >749     mg/dL   Very High   ALT 34 44/96/7591   AST 29 05/29/2020   NA 139 05/29/2020   K 4.0 05/29/2020   CL 109 05/29/2020   CREATININE 1.09 05/29/2020   BUN 27 (H) 05/29/2020   CO2 24 05/29/2020    CT Biopsy  Result Date: 05/08/2020 CLINICAL DATA:  13 cm left-sided mesenteric abdominal mass. EXAM: CT GUIDED CORE BIOPSY OF MESENTERIC MASS ANESTHESIA/SEDATION: 1.5 mg IV Versed; 75 mcg IV Fentanyl Total Moderate Sedation Time:  18 minutes. The patient's level of consciousness and physiologic status were continuously monitored during the procedure by Radiology nursing. PROCEDURE: The procedure risks, benefits, and alternatives were explained to the patient. Questions regarding the procedure were encouraged and answered. The  patient understands and consents to the  procedure. A time-out was performed prior to initiating the procedure. CT was performed through the abdomen in a supine position. The left abdominal wall was prepped with chlorhexidine in a sterile fashion, and a sterile drape was applied covering the operative field. A sterile gown and sterile gloves were used for the procedure. Local anesthesia was provided with 1% Lidocaine. Under CT guidance, a 17 gauge trocar needle was advanced to the level of a left-sided mesenteric mass. After confirming needle tip position, 4 separate coaxial 18 gauge core biopsy samples were obtained and submitted in saline. Some Gel-Foam pledgets were advanced through the outer needle as the needle was retracted and removed. COMPLICATIONS: None FINDINGS: Large left-sided intra-abdominal mass within the mesentery measures up to 13.3 cm in maximum diameter. Solid tissue was obtained. IMPRESSION: CT-guided core biopsy performed of large left-sided mesenteric mass measuring just over 13 cm in estimated maximal diameter. Electronically Signed   By: Aletta Edouard M.D.   On: 05/08/2020 15:02   ECHOCARDIOGRAM COMPLETE  Result Date: 05/22/2020    ECHOCARDIOGRAM REPORT   Patient Name:   TANIELA FELTUS [facility] Date of Exam: 05/22/2020 Medical Rec #:  [id number]        Height:       68.0 in Accession #:    [id number]       Weight:       171.0 lb Date of Birth:  1946/03/18        BSA:          1.912 m Patient Age:    22 years         BP:           116/77 mmHg Patient Gender: M                HR:           80 bpm. Exam Location:  Outpatient Procedure: 2D Echo, Cardiac Doppler and Color Doppler Indications:    Chemotherapy evaluation v87.41 / v58.11  History:        Patient has no prior history of Echocardiogram examinations. TIA                 and Stroke; Risk Factors:Hypertension, Dyslipidemia and GERD.  Sonographer:    Bernadene Person RDCS Referring Phys: [id number] [place]  1. Left ventricular ejection fraction, by estimation, is 60 to  65%. The left ventricle has normal function. The left ventricle has no regional wall motion abnormalities. Left ventricular diastolic parameters are consistent with Grade I diastolic dysfunction (impaired relaxation). The average left ventricular global longitudinal strain is -17.6 %. The global longitudinal strain is normal.  2. Right ventricular systolic function is normal. The right ventricular size is normal.  3. The mitral valve is normal in structure. No evidence of mitral valve regurgitation. No evidence of mitral stenosis.  4. The aortic valve is normal in structure. Aortic valve regurgitation is not visualized. No aortic stenosis is present.  5. The inferior vena cava is normal in size with greater than 50% respiratory variability, suggesting right atrial pressure of 3 mmHg. FINDINGS  Left Ventricle: Left ventricular ejection fraction, by estimation, is 60 to 65%. The left ventricle has normal function. The left ventricle has no regional wall motion abnormalities. The average left ventricular global longitudinal strain is -17.6 %. The global longitudinal strain is normal. The left ventricular internal cavity size was normal in size. There is no left ventricular hypertrophy. Left  ventricular diastolic parameters are consistent with Grade I diastolic dysfunction (impaired relaxation). Right Ventricle: The right ventricular size is normal. No increase in right ventricular wall thickness. Right ventricular systolic function is normal. Left Atrium: Left atrial size was normal in size. Right Atrium: Right atrial size was normal in size. Pericardium: There is no evidence of pericardial effusion. Mitral Valve: The mitral valve is normal in structure. Normal mobility of the mitral valve leaflets. No evidence of mitral valve regurgitation. No evidence of mitral valve stenosis. Tricuspid Valve: The tricuspid valve is normal in structure. Tricuspid valve regurgitation is not demonstrated. No evidence of tricuspid  stenosis. Aortic Valve: The aortic valve is normal in structure. Aortic valve regurgitation is not visualized. No aortic stenosis is present. Pulmonic Valve: The pulmonic valve was normal in structure. Pulmonic valve regurgitation is trivial. No evidence of pulmonic stenosis. Aorta: The aortic root is normal in size and structure. Venous: The inferior vena cava is normal in size with greater than 50% respiratory variability, suggesting right atrial pressure of 3 mmHg. IAS/Shunts: No atrial level shunt detected by color flow Doppler.  LEFT VENTRICLE PLAX 2D LVIDd:         5.10 cm  Diastology LVIDs:         3.20 cm  LV e' lateral:   9.94 cm/s LV PW:         0.70 cm  LV E/e' lateral: 4.6 LV IVS:        0.70 cm  LV e' medial:    6.85 cm/s LVOT diam:     2.10 cm  LV E/e' medial:  6.6 LV SV:         81 LV SV Index:   43       2D Longitudinal Strain LVOT Area:     3.46 cm 2D Strain GLS Avg:     -17.6 %  RIGHT VENTRICLE RV S prime:     9.79 cm/s TAPSE (M-mode): 1.6 cm LEFT ATRIUM             Index       RIGHT ATRIUM           Index LA diam:        3.70 cm 1.94 cm/m  RA Area:     14.40 cm LA Vol (A2C):   69.5 ml 36.35 ml/m RA Volume:   31.90 ml  16.68 ml/m LA Vol (A4C):   51.0 ml 26.67 ml/m LA Biplane Vol: 59.4 ml 31.07 ml/m  AORTIC VALVE LVOT Vmax:   143.00 cm/s LVOT Vmean:  98.900 cm/s LVOT VTI:    0.235 m  AORTA Ao Root diam: 3.20 cm Ao Asc diam:  3.40 cm MITRAL VALVE MV Area (PHT): 2.22 cm    SHUNTS MV Decel Time: 342 msec    Systemic VTI:  0.24 m MV E velocity: 45.30 cm/s  Systemic Diam: 2.10 cm MV A velocity: 72.50 cm/s MV E/A ratio:  0.62 Candee Furbish MD Electronically signed by Candee Furbish MD Signature Date/Time: 05/22/2020/12:55:40 PM    Final    IR IMAGING GUIDED PORT INSERTION  Result Date: 05/19/2020 INDICATION: History of diffuse large B-cell lymphoma. In need of durable intravenous access for chemotherapy administration EXAM: IMPLANTED PORT A CATH PLACEMENT WITH ULTRASOUND AND FLUOROSCOPIC GUIDANCE  COMPARISON:  PET-CT-04/28/2020 MEDICATIONS: Ancef 2 gm IV; The antibiotic was administered within an appropriate time interval prior to skin puncture. ANESTHESIA/SEDATION: Moderate (conscious) sedation was employed during this procedure. A total of Versed 2 mg and Fentanyl  100 mcg was administered intravenously. Moderate Sedation Time: 25 minutes. The patient's level of consciousness and vital signs were monitored continuously by radiology nursing throughout the procedure under my direct supervision. CONTRAST:  None FLUOROSCOPY TIME:  24 seconds (5 mGy) COMPLICATIONS: None immediate. PROCEDURE: The procedure, risks, benefits, and alternatives were explained to the patient. Questions regarding the procedure were encouraged and answered. The patient understands and consents to the procedure. The right neck and chest were prepped with chlorhexidine in a sterile fashion, and a sterile drape was applied covering the operative field. Maximum barrier sterile technique with sterile gowns and gloves were used for the procedure. A timeout was performed prior to the initiation of the procedure. Local anesthesia was provided with 1% lidocaine with epinephrine. After creating a small venotomy incision, a micropuncture kit was utilized to access the internal jugular vein. Real-time ultrasound guidance was utilized for vascular access including the acquisition of a permanent ultrasound image documenting patency of the accessed vessel. The microwire was utilized to measure appropriate catheter length. A subcutaneous port pocket was then created along the upper chest wall utilizing a combination of sharp and blunt dissection. The pocket was irrigated with sterile saline. A single lumen "Slim" sized power injectable port was chosen for placement. The 8 Fr catheter was tunneled from the port pocket site to the venotomy incision. The port was placed in the pocket. The external catheter was trimmed to appropriate length. At the  venotomy, an 8 Fr peel-away sheath was placed over a guidewire under fluoroscopic guidance. The catheter was then placed through the sheath and the sheath was removed. Final catheter positioning was confirmed and documented with a fluoroscopic spot radiograph. The port was accessed with a Huber needle, aspirated and flushed with heparinized saline. The venotomy site was closed with an interrupted 4-0 Vicryl suture. The port pocket incision was closed with interrupted 2-0 Vicryl suture. The skin was opposed with a running subcuticular 4-0 Vicryl suture. Dermabond and Steri-strips were applied to both incisions. Dressings were applied. The patient tolerated the procedure well without immediate post procedural complication. FINDINGS: After catheter placement, the tip lies within the superior cavoatrial junction. The catheter aspirates and flushes normally and is ready for immediate use. IMPRESSION: Successful placement of a right internal jugular approach power injectable Port-A-Cath. The catheter is ready for immediate use. Electronically Signed   By: Simonne Come M.D.   On: 05/19/2020 15:59     CT Biopsy  Result Date: 05/08/2020 CLINICAL DATA:  13 cm left-sided mesenteric abdominal mass. EXAM: CT GUIDED CORE BIOPSY OF MESENTERIC MASS ANESTHESIA/SEDATION: 1.5 mg IV Versed; 75 mcg IV Fentanyl Total Moderate Sedation Time:  18 minutes. The patient's level of consciousness and physiologic status were continuously monitored during the procedure by Radiology nursing. PROCEDURE: The procedure risks, benefits, and alternatives were explained to the patient. Questions regarding the procedure were encouraged and answered. The patient understands and consents to the procedure. A time-out was performed prior to initiating the procedure. CT was performed through the abdomen in a supine position. The left abdominal wall was prepped with chlorhexidine in a sterile fashion, and a sterile drape was applied covering the operative  field. A sterile gown and sterile gloves were used for the procedure. Local anesthesia was provided with 1% Lidocaine. Under CT guidance, a 17 gauge trocar needle was advanced to the level of a left-sided mesenteric mass. After confirming needle tip position, 4 separate coaxial 18 gauge core biopsy samples were obtained and submitted in saline. Some  Gel-Foam pledgets were advanced through the outer needle as the needle was retracted and removed. COMPLICATIONS: None FINDINGS: Large left-sided intra-abdominal mass within the mesentery measures up to 13.3 cm in maximum diameter. Solid tissue was obtained. IMPRESSION: CT-guided core biopsy performed of large left-sided mesenteric mass measuring just over 13 cm in estimated maximal diameter. Electronically Signed   By: Irish Lack M.D.   On: 05/08/2020 15:02   ECHOCARDIOGRAM COMPLETE  Result Date: 05/22/2020    ECHOCARDIOGRAM REPORT   Patient Name:   Connor [facility] Date of Exam: 05/22/2020 Medical Rec #:  [id number]        Height:       68.0 in Accession #:    [id number]       Weight:       171.0 lb Date of Birth:  [birth year]        BSA:          1.912 m Patient Age:    73 years         BP:           116/77 mmHg Patient Gender: M                HR:           80 bpm. Exam Location:  Outpatient Procedure: 2D Echo, Cardiac Doppler and Color Doppler Indications:    Chemotherapy evaluation v87.41 / v58.11  History:        Patient has no prior history of Echocardiogram examinations. TIA                 and Stroke; Risk Factors:Hypertension, Dyslipidemia and GERD.  Sonographer:    Eulah Pont RDCS Referring Phys: [id number] Jackquline Denmark DORSEY IV IMPRESSIONS  1. Left ventricular ejection fraction, by estimation, is 60 to 65%. The left ventricle has normal function. The left ventricle has no regional wall motion abnormalities. Left ventricular diastolic parameters are consistent with Grade I diastolic dysfunction (impaired relaxation). The average left ventricular global  longitudinal strain is -17.6 %. The global longitudinal strain is normal.  2. Right ventricular systolic function is normal. The right ventricular size is normal.  3. The mitral valve is normal in structure. No evidence of mitral valve regurgitation. No evidence of mitral stenosis.  4. The aortic valve is normal in structure. Aortic valve regurgitation is not visualized. No aortic stenosis is present.  5. The inferior vena cava is normal in size with greater than 50% respiratory variability, suggesting right atrial pressure of 3 mmHg. FINDINGS  Left Ventricle: Left ventricular ejection fraction, by estimation, is 60 to 65%. The left ventricle has normal function. The left ventricle has no regional wall motion abnormalities. The average left ventricular global longitudinal strain is -17.6 %. The global longitudinal strain is normal. The left ventricular internal cavity size was normal in size. There is no left ventricular hypertrophy. Left ventricular diastolic parameters are consistent with Grade I diastolic dysfunction (impaired relaxation). Right Ventricle: The right ventricular size is normal. No increase in right ventricular wall thickness. Right ventricular systolic function is normal. Left Atrium: Left atrial size was normal in size. Right Atrium: Right atrial size was normal in size. Pericardium: There is no evidence of pericardial effusion. Mitral Valve: The mitral valve is normal in structure. Normal mobility of the mitral valve leaflets. No evidence of mitral valve regurgitation. No evidence of mitral valve stenosis. Tricuspid Valve: The tricuspid valve is normal in structure. Tricuspid valve regurgitation is not demonstrated. No evidence of  tricuspid stenosis. Aortic Valve: The aortic valve is normal in structure. Aortic valve regurgitation is not visualized. No aortic stenosis is present. Pulmonic Valve: The pulmonic valve was normal in structure. Pulmonic valve regurgitation is trivial. No evidence of  pulmonic stenosis. Aorta: The aortic root is normal in size and structure. Venous: The inferior vena cava is normal in size with greater than 50% respiratory variability, suggesting right atrial pressure of 3 mmHg. IAS/Shunts: No atrial level shunt detected by color flow Doppler.  LEFT VENTRICLE PLAX 2D LVIDd:         5.10 cm  Diastology LVIDs:         3.20 cm  LV e' lateral:   9.94 cm/s LV PW:         0.70 cm  LV E/e' lateral: 4.6 LV IVS:        0.70 cm  LV e' medial:    6.85 cm/s LVOT diam:     2.10 cm  LV E/e' medial:  6.6 LV SV:         81 LV SV Index:   43       2D Longitudinal Strain LVOT Area:     3.46 cm 2D Strain GLS Avg:     -17.6 %  RIGHT VENTRICLE RV S prime:     9.79 cm/s TAPSE (M-mode): 1.6 cm LEFT ATRIUM             Index       RIGHT ATRIUM           Index LA diam:        3.70 cm 1.94 cm/m  RA Area:     14.40 cm LA Vol (A2C):   69.5 ml 36.35 ml/m RA Volume:   31.90 ml  16.68 ml/m LA Vol (A4C):   51.0 ml 26.67 ml/m LA Biplane Vol: 59.4 ml 31.07 ml/m  AORTIC VALVE LVOT Vmax:   143.00 cm/s LVOT Vmean:  98.900 cm/s LVOT VTI:    0.235 m  AORTA Ao Root diam: 3.20 cm Ao Asc diam:  3.40 cm MITRAL VALVE MV Area (PHT): 2.22 cm    SHUNTS MV Decel Time: 342 msec    Systemic VTI:  0.24 m MV E velocity: 45.30 cm/s  Systemic Diam: 2.10 cm MV A velocity: 72.50 cm/s MV E/A ratio:  0.62 Donato Schultz MD Electronically signed by Donato Schultz MD Signature Date/Time: 05/22/2020/12:55:40 PM    Final    IR IMAGING GUIDED PORT INSERTION  Result Date: 05/19/2020 INDICATION: History of diffuse large B-cell lymphoma. In need of durable intravenous access for chemotherapy administration EXAM: IMPLANTED PORT A CATH PLACEMENT WITH ULTRASOUND AND FLUOROSCOPIC GUIDANCE COMPARISON:  PET-CT-04/28/2020 MEDICATIONS: Ancef 2 gm IV; The antibiotic was administered within an appropriate time interval prior to skin puncture. ANESTHESIA/SEDATION: Moderate (conscious) sedation was employed during this procedure. A total of Versed 2 mg  and Fentanyl 100 mcg was administered intravenously. Moderate Sedation Time: 25 minutes. The patient's level of consciousness and vital signs were monitored continuously by radiology nursing throughout the procedure under my direct supervision. CONTRAST:  None FLUOROSCOPY TIME:  24 seconds (5 mGy) COMPLICATIONS: None immediate. PROCEDURE: The procedure, risks, benefits, and alternatives were explained to the patient. Questions regarding the procedure were encouraged and answered. The patient understands and consents to the procedure. The right neck and chest were prepped with chlorhexidine in a sterile fashion, and a sterile drape was applied covering the operative field. Maximum barrier sterile technique with sterile gowns and gloves were  used for the procedure. A timeout was performed prior to the initiation of the procedure. Local anesthesia was provided with 1% lidocaine with epinephrine. After creating a small venotomy incision, a micropuncture kit was utilized to access the internal jugular vein. Real-time ultrasound guidance was utilized for vascular access including the acquisition of a permanent ultrasound image documenting patency of the accessed vessel. The microwire was utilized to measure appropriate catheter length. A subcutaneous port pocket was then created along the upper chest wall utilizing a combination of sharp and blunt dissection. The pocket was irrigated with sterile saline. A single lumen "Slim" sized power injectable port was chosen for placement. The 8 Fr catheter was tunneled from the port pocket site to the venotomy incision. The port was placed in the pocket. The external catheter was trimmed to appropriate length. At the venotomy, an 8 Fr peel-away sheath was placed over a guidewire under fluoroscopic guidance. The catheter was then placed through the sheath and the sheath was removed. Final catheter positioning was confirmed and documented with a fluoroscopic spot radiograph. The port  was accessed with a Huber needle, aspirated and flushed with heparinized saline. The venotomy site was closed with an interrupted 4-0 Vicryl suture. The port pocket incision was closed with interrupted 2-0 Vicryl suture. The skin was opposed with a running subcuticular 4-0 Vicryl suture. Dermabond and Steri-strips were applied to both incisions. Dressings were applied. The patient tolerated the procedure well without immediate post procedural complication. FINDINGS: After catheter placement, the tip lies within the superior cavoatrial junction. The catheter aspirates and flushes normally and is ready for immediate use. IMPRESSION: Successful placement of a right internal jugular approach power injectable Port-A-Cath. The catheter is ready for immediate use. Electronically Signed   By: Sandi Mariscal M.D.   On: 05/19/2020 15:59    Assessment and Plan:  Connor Adams 74 y.o. male with medical history significant for Stage I DLBCL who presents for a follow up visit.  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  He is currently receiving cycle #2 of EPOCH-R starting on 06/16/2020.  Today he is Cycle 5 Day 2 of treatment.  He is tolerating his chemotherapy well so far with no nausea or vomiting.  Bowels are moving and he has a good appetite.  There are no barriers to proceeding with treatment.      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 Stage I bulky disease.   --Continue EPOCH-R. Today is Cycle 5 Day 2 --Adverse effects have been reviewed including, but not limited to, alopecia, myelosuppression, peripheral neuropathy, mucositis,  liver/renal dysfunction, and possible infusion reaction associated with Rituxan. He agrees to proceed. --PAC in place and ready for use. --Echocardiogram from 05/22/2020 shows LVEF of 60-65%  --CBC adequate to proceed with treatment today.  Leukocytosis likely related to steroids.  Anemia slightly worse this morning.  He is asymptomatic.  Discussed with patient that he may need PRBC transfusion this admission.  Plan to transfuse for hemoglobin less than 7. --Will monitor closely with CBC with diff, CMET, LDH, and uric acid --Lovenox for DVT prophylaxis. --Continue home medications for HTN and hyperlipidemia.  --Continue PRN antiemetics. --  Continue MiraLAX and Senokot.  #Pulmonary nodules of unclear etiology --PET scan from 08/15/2020 showed new bilateral pulmonary nodules --Etiology unclear --Appreciate pulmonology consult.  They recommend repeat CT scan of the chest in approximately 6 weeks to reevaluate.  The patient is scheduled for labs, follow-up visit, Rituxan, and Neulasta on Monday, 08/25/2020.  [name], DNP, AGPCNP-BC, AOCNP Mon/Tues/Thurs/Fri 7am-5pm; Off Wednesdays Cell: [phone]

## 2020-08-19 NOTE — Progress Notes (Addendum)
Chemotherapy dosages, total VTBI, expiration date/time, 2 pharmacy signatures, and patient identifiers independently verified by me and by Imagene Gurney, RN. Noted provider's approval to administer chemotherapy despite patient's hemoglobin level. [facility] 08/19/2020 11:20 AM

## 2020-08-20 ENCOUNTER — Ambulatory Visit (HOSPITAL_COMMUNITY): Payer: Medicare PPO

## 2020-08-20 DIAGNOSIS — C8338 Diffuse large B-cell lymphoma, lymph nodes of multiple sites: Secondary | ICD-10-CM | POA: Diagnosis not present

## 2020-08-20 DIAGNOSIS — D72819 Decreased white blood cell count, unspecified: Secondary | ICD-10-CM | POA: Diagnosis not present

## 2020-08-20 LAB — CBC WITH DIFFERENTIAL/PLATELET
Abs Immature Granulocytes: 0.27 10*3/uL — ABNORMAL HIGH (ref 0.00–0.07)
Basophils Absolute: 0 10*3/uL (ref 0.0–0.1)
Basophils Relative: 0 %
Eosinophils Absolute: 0 10*3/uL (ref 0.0–0.5)
Eosinophils Relative: 0 %
HCT: 28.6 % — ABNORMAL LOW (ref 39.0–52.0)
Hemoglobin: 8.6 g/dL — ABNORMAL LOW (ref 13.0–17.0)
Immature Granulocytes: 1 %
Lymphocytes Relative: 2 %
Lymphs Abs: 0.3 10*3/uL — ABNORMAL LOW (ref 0.7–4.0)
MCH: 28.5 pg (ref 26.0–34.0)
MCHC: 30.1 g/dL (ref 30.0–36.0)
MCV: 94.7 fL (ref 80.0–100.0)
Monocytes Absolute: 1 10*3/uL (ref 0.1–1.0)
Monocytes Relative: 5 %
Neutro Abs: 20.4 10*3/uL — ABNORMAL HIGH (ref 1.7–7.7)
Neutrophils Relative %: 92 %
Platelets: 429 10*3/uL — ABNORMAL HIGH (ref 150–400)
RBC: 3.02 MIL/uL — ABNORMAL LOW (ref 4.22–5.81)
RDW: 20.8 % — ABNORMAL HIGH (ref 11.5–15.5)
WBC: 22 10*3/uL — ABNORMAL HIGH (ref 4.0–10.5)
nRBC: 0 % (ref 0.0–0.2)

## 2020-08-20 LAB — COMPREHENSIVE METABOLIC PANEL
ALT: 21 U/L (ref 0–44)
AST: 17 U/L (ref 15–41)
Albumin: 3.6 g/dL (ref 3.5–5.0)
Alkaline Phosphatase: 58 U/L (ref 38–126)
Anion gap: 10 (ref 5–15)
BUN: 28 mg/dL — ABNORMAL HIGH (ref 8–23)
CO2: 24 mmol/L (ref 22–32)
Calcium: 9.3 mg/dL (ref 8.9–10.3)
Chloride: 110 mmol/L (ref 98–111)
Creatinine, Ser: 1.11 mg/dL (ref 0.61–1.24)
GFR, Estimated: >60 mL/min (ref >60)
Glucose, Bld: 114 mg/dL — ABNORMAL HIGH (ref 70–99)
Potassium: 4 mmol/L (ref 3.5–5.1)
Sodium: 144 mmol/L (ref 135–145)
Total Bilirubin: 0.5 mg/dL (ref 0.3–1.2)
Total Protein: 6.2 g/dL — ABNORMAL LOW (ref 6.5–8.1)

## 2020-08-20 LAB — URIC ACID: Uric Acid, Serum: 6.2 mg/dL (ref 3.7–8.6)

## 2020-08-20 LAB — LACTATE DEHYDROGENASE: LDH: 158 U/L (ref 98–192)

## 2020-08-20 MED ORDER — SODIUM CHLORIDE 0.9 % IV SOLN
Freq: Once | INTRAVENOUS | Status: AC
  Administered 2020-08-20: 18 mg via INTRAVENOUS
  Filled 2020-08-20: qty 4

## 2020-08-20 MED ORDER — VINCRISTINE SULFATE CHEMO INJECTION 1 MG/ML
Freq: Once | INTRAVENOUS | Status: AC
  Filled 2020-08-20: qty 14

## 2020-08-20 NOTE — Progress Notes (Signed)
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   1) 04/17/2020: CT A/P showed dominant nodal mass(11.5 x 10.9 cm)within the jejunal mesentery, highly suspicious for lymphoma. Additionally there are left lower lobe pleural-based pulmonary nodules 2) 04/24/2020: establish care with Dr. Lorenso Courier 3) 04/28/2020:  PET CT scan shows Large solid left mesenteric mass 13.0 cm with maximum SUV of 21.2, [place] 5 with local hypermetabolic porta hepatis and retroperitoneal lymph nodes. Constitutes bulky Stage I disease.   Reason for Admission: double hit lymphoma, Cycle #5 EPOCH-R  HPI: Connor Adams is a 74 year old male with medical history significant for double hit DLBCL. He presents for Cycle 4 of R-EPOCH chemotherapy. Risks and benefits have been discussed and he has agreed to proceed  Interval History: --slept well overnight, no chest pain or back pain --small BM yesterday and Monday --good appetite, no n/v/d --denies F/C/S. Energy levels good, continues ambulating the halls daily.    Diagnosis Date  . Arthritis   . Chronic kidney disease (CKD), stage III (moderate)   . COVID-19   . GERD (gastroesophageal reflux disease)    occ  . Hemorrhoids   . History of ETT 3/05   low risk  . History of hiatal hernia   . History of kidney stones   . HLD (hyperlipidemia)   . HTN (hypertension)   . Left inguinal hernia   . Melanoma ([place])    excied  . 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 Stroke [facility]) 2011   tia   . TIA (transient ischemic attack) 4/11   associated w slurred speecha ndmild facial droop. lasted for about 10 minutes. Had MRI, etc with [facility] that per his report was ok (dr. Stephannie Li). Echo (4/11): EF 55-60%, no regional  WMAs, normal diastolic function, mild LAE, no source of embolus  Past Surgical History:  Procedure Laterality Date  . BMI 30.2 muscular  12/08/04  . curretage actinic keratosis R ear  01/20/05  . ETT low prob of CAD  11/21/03  . excision melanoma in situ back  01/20/05  . hemorrhoids sclerosed at colonoscopy  12/19/05  . IR IMAGING GUIDED PORT INSERTION  05/19/2020  . LAPAROSCOPIC INGUINAL HERNIA REPAIR Left 12/20/1995  . LUMBAR DISC SURGERY    . NERVE REPAIR     back of neck  . retinal laser surgery Left 09/20/1986  . SEPTOPLASTY  11/19/98  . TOTAL KNEE ARTHROPLASTY Left 12/13/2016   Procedure: TOTAL KNEE ARTHROPLASTY WITH RIGHT KNEE CORTISONE INJECTION;  Surgeon: Frederik Pear, MD;  Location: [place];  Service: Orthopedics;  Laterality: Left;  . uric acid 6.9  12/22/04  . x-ray l great toe  07/21/00       Fre Past Surgical History:  Procedure Laterality Date  . BMI 30.2 muscular  12/08/04  . curretage actinic keratosis R ear  01/20/05  . ETT low prob of CAD  11/21/03  . excision melanoma in situ back  01/20/05  . hemorrhoids sclerosed at colonoscopy  12/19/05  . IR IMAGING GUIDED PORT INSERTION  05/19/2020  . LAPAROSCOPIC INGUINAL HERNIA REPAIR Left 12/20/1995  . LUMBAR DISC SURGERY    . NERVE REPAIR     back of neck  . retinal laser surgery Left 09/20/1986  . SEPTOPLASTY  11/19/98  .  TOTAL KNEE ARTHROPLASTY Left 12/13/2016   Procedure: TOTAL KNEE ARTHROPLASTY WITH RIGHT KNEE CORTISONE INJECTION;  Surgeon: Frederik Pear, MD;  Location: [place];  Service: Orthopedics;  Laterality: Left;  . uric acid 6.9  12/22/04  . x-ray l great toe  07/21/00  quency Provider Last Rate Last Admin  . 0.9 %  sodium chloride infusion   Intravenous Continuous [name] IV, MD 20 mL/hr at 05/29/20 0400 Rate Verify at 05/29/20 0400  . allopurinol (ZYLOPRIM) tablet 300 mg  300 mg Oral Daily [name] R, NP   300 mg at 05/29/20 1038  . aspirin tablet 325 mg  325 mg Oral Q4H PRN Maryanna Shape, NP      . Chlorhexidine  Gluconate Cloth 2 % PADS 6 each  6 each Topical Daily Maryanna Shape, NP   6 each at 05/29/20 1038  . Cold Pack 1 packet  1 packet Topical Once PRN [name] IV, MD      . DOXOrubicin (ADRIAMYCIN) 20 mg, etoposide (VEPESID) 96 mg, vinCRIStine (ONCOVIN) 0.8 mg in sodium chloride 0.9 % 1,000 mL chemo infusion   Intravenous Once Orson Slick, MD 51 mL/hr at 05/28/20 1422 New Bag at 05/28/20 1422  . DOXOrubicin (ADRIAMYCIN) 20 mg, etoposide (VEPESID) 96 mg, vinCRIStine (ONCOVIN) 0.8 mg in sodium chloride 0.9 % 1,000 mL chemo infusion   Intravenous Once Narda Rutherford T IV, MD      . enoxaparin (LOVENOX) injection 40 mg  40 mg Subcutaneous Q24H [name] R, NP   40 mg at 05/28/20 1116  . Hot Pack 1 packet  1 packet Topical Once PRN [name] IV, MD      . hydrocortisone (ANUSOL-HC) 2.5 % rectal cream 1 application  1 application Rectal Daily PRN Maryanna Shape, NP      . losartan (COZAAR) tablet 25 mg  25 mg Oral Daily [name] R, NP   25 mg at 05/29/20 1038  . multivitamin with minerals tablet 1 tablet  1 tablet Oral Daily Maryanna Shape, NP   1 tablet at 05/29/20 1038  . ondansetron (ZOFRAN-ODT) disintegrating tablet 8 mg  8 mg Oral Q8H PRN Curcio, Kristin R, NP      . pantoprazole (PROTONIX) EC tablet 40 mg  40 mg Oral BID [name] R, NP   40 mg at 05/29/20 1038  . polyethylene glycol (MIRALAX / GLYCOLAX) packet 17 g  17 g Oral Daily PRN Narda Rutherford T IV, MD      . predniSONE (DELTASONE) tablet 120 mg  120 mg Oral Q breakfast [name] IV, MD   120 mg at 05/29/20 0819  . prochlorperazine (COMPAZINE) tablet 10 mg  10 mg Oral Q6H PRN [name] R, NP      . rosuvastatin (CRESTOR) tablet 5 mg  5 mg Oral QPM Curcio, Kristin R, NP   5 mg at 05/28/20 1744  . senna-docusate (Senokot-S) tablet 2 tablet  2 tablet Oral BID Orson Slick, MD   2 tablet at 05/29/20 1038  . sodium bicarbonate/sodium chloride mouthwash 1067ml   Mouth Rinse PRN Curcio, Kristin  R, NP      . sodium chloride flush (NS) 0.9 % injection 10-40 mL  10-40 mL Intracatheter Q12H Maryanna Shape, NP   10 mL at 05/28/20 2129  . sodium chloride flush (NS) 0.9 % injection 10-40 mL  10-40 mL Intracatheter PRN Maryanna Shape, NP      .  sucralfate (CARAFATE) 1 GM/10ML suspension 1 g  1 g Oral TID WC & HS Maryanna Shape, NP   1 g at 05/29/20 8099       Family History  Problem Relation Age of Onset  . Liver cancer Sister   . Heart failure Brother 44       CABGx4  . Heart attack Mother 36  . Uterine cancer Mother   . Heart attack Father 62  . Asthma Son   . Diabetes Brother   . Prostate cancer Brother   Relation Age of Onset  . Liver cancer Sister   . Heart failure Brother 44       CABGx4  . Heart attack Mother 36  . Uterine cancer Mother   . Heart attack Father 75  . Asthma Son   . Diabetes Brother   . Prostate cancer Brother      Socioeconomic History  . Marital status: Married    Spouse name: [name]  . Number of children: 2  . Years of education: 13  . Highest education level: Not on file  Occupational History  . Occupation: Systems developer: [employer]  Tobacco Use  . Smoking status: Never Smoker  . Smokeless tobacco: Never Used  Vaping Use  . Vaping Use: Never used  Substance and Sexual Activity  . Alcohol use: No  . Drug use: No  . Sexual activity: Not on file  Other Topics Concern  . Not on file  Social History Narrative   Married to [employer] at [employer].   Son, [name] married   Son, [name], Married.    Caffeine use: 1 cup coffee per day   Social Determinants of Health   Financial Resource Strain:   . Difficulty of Paying Living Expenses: Not on file  Food Insecurity:   . Worried About Charity fundraiser in the Last Year: Not on file  . Ran Out of Food in the Last Year: Not on file  Transportation Needs:   . Lack of Transportation (Medical): Not on file  . Lack of Transportation  (Non-Medical): Not on file  Physical Activity:   . Days of Exercise per Week: Not on file  . Minutes of Exercise per Session: Not on file  Stress:   . Feeling of Stress : Not on file  Social Connections:   . Frequency of Communication with Friends and Family: Not on file  . Frequency of Social Gatherings with Friends and Family: Not on file  . Attends Religious Services: Not on file  . Active Member of Clubs or Organizations: Not on file  . Attends Archivist Meetings: Not on file  . Marital Status: Not on file  Intimate Partner Violence:   . Fear of Current or Ex-Partner: Not on file  . Emotionally Abused: Not on file  . Physically Abused: Not on file  . Sexually Abused: Not on file  :  Review of Systems: A comprehensive 14 point review of systems was negative except as noted in the HPI.  Exam: Patient Vitals for the past 24 hrs:  BP Temp Temp src Pulse Resp SpO2  05/29/20 0600 107/60 98 F (36.7 C) Oral (!) 56 20 97 %  05/28/20 2222 125/68 (!) 97.4 F (36.3 C) Oral 60 20 98 %  05/28/20 1503 113/75 (!) 97.4 F (36.3 C) Oral (!) 53 18 96 %    General:  well-nourished in no acute distress.  Eyes:  no scleral icterus.   ENT:  There were no oropharyngeal lesions.   Neck was without thyromegaly.   Lymphatics:  Negative cervical, supraclavicular or axillary adenopathy.   Respiratory: lungs were clear bilaterally without wheezing or crackles.   Cardiovascular:  Regular rate and rhythm, S1/S2, without murmur, rub or gallop.  There was no pedal edema.   GI:  Positive bowel sounds.  Left upper quadrant mass not palpable. Musculoskeletal:  no spinal tenderness of palpation of vertebral spine.   Skin exam was without echymosis, petichae.   Neuro exam was nonfocal. Patient was alert and oriented.  Attention was good.   Language was appropriate.  Mood was normal without depression.  Speech was not pressured.  Thought content was not tangential.     Lab Results  Component  Value Date   WBC 17.9 (H) 05/29/2020   HGB 9.8 (L) 05/29/2020   HCT 30.3 (L) 05/29/2020   PLT 351 05/29/2020   GLUCOSE 141 (H) 05/29/2020   CHOL  12/31/2009    160        ATP III CLASSIFICATION:  <200     mg/dL   Desirable  200-239  mg/dL   Borderline High  >=240    mg/dL   High          TRIG 213 (H) 12/31/2009   HDL 28 (L) 12/31/2009   LDLCALC  12/31/2009    89        Total Cholesterol/HDL:CHD Risk Coronary Heart Disease Risk Table                     Men   Women  1/2 Average Risk   3.4   3.3  Average Risk       5.0   4.4  2 X Average Risk   9.6   7.1  3 X Average Risk  23.4   11.0        Use the calculated Patient Ratio above and the CHD Risk Table to determine the patient's CHD Risk.        ATP III CLASSIFICATION (LDL):  <100     mg/dL   Optimal  100-129  mg/dL   Near or Above                    Optimal  130-159  mg/dL   Borderline  160-189  mg/dL   High  >190     mg/dL   Very High   ALT 34 05/29/2020   AST 29 05/29/2020   NA 139 05/29/2020   K 4.0 05/29/2020   CL 109 05/29/2020   CREATININE 1.09 05/29/2020   BUN 27 (H) 05/29/2020   CO2 24 05/29/2020    CT Biopsy  Result Date: 05/08/2020 CLINICAL DATA:  13 cm left-sided mesenteric abdominal mass. EXAM: CT GUIDED CORE BIOPSY OF MESENTERIC MASS ANESTHESIA/SEDATION: 1.5 mg IV Versed; 75 mcg IV Fentanyl Total Moderate Sedation Time:  18 minutes. The patient's level of consciousness and physiologic status were continuously monitored during the procedure by Radiology nursing. PROCEDURE: The procedure risks, benefits, and alternatives were explained to the patient. Questions regarding the procedure were encouraged and answered. The patient understands and consents to the procedure. A time-out was performed prior to initiating the procedure. CT was performed through the abdomen in a supine position. The left abdominal wall was prepped with chlorhexidine in a sterile fashion, and a sterile drape was applied covering the  operative field. A sterile gown and  sterile gloves were used for the procedure. Local anesthesia was provided with 1% Lidocaine. Under CT guidance, a 17 gauge trocar needle was advanced to the level of a left-sided mesenteric mass. After confirming needle tip position, 4 separate coaxial 18 gauge core biopsy samples were obtained and submitted in saline. Some Gel-Foam pledgets were advanced through the outer needle as the needle was retracted and removed. COMPLICATIONS: None FINDINGS: Large left-sided intra-abdominal mass within the mesentery measures up to 13.3 cm in maximum diameter. Solid tissue was obtained. IMPRESSION: CT-guided core biopsy performed of large left-sided mesenteric mass measuring just over 13 cm in estimated maximal diameter. Electronically Signed   By: Aletta Edouard M.D.   On: 05/08/2020 15:02   ECHOCARDIOGRAM COMPLETE  Result Date: 05/22/2020    ECHOCARDIOGRAM REPORT   Patient Name:   Connor Adams [facility] Date of Exam: 05/22/2020 Medical Rec #:  [id number]        Height:       68.0 in Accession #:    [id number]       Weight:       171.0 lb Date of Birth:  [birth year]        BSA:          1.912 m Patient Age:    52 years         BP:           116/77 mmHg Patient Gender: M                HR:           80 bpm. Exam Location:  Outpatient Procedure: 2D Echo, Cardiac Doppler and Color Doppler Indications:    Chemotherapy evaluation v87.41 / v58.11  History:        Patient has no prior history of Echocardiogram examinations. TIA                 and Stroke; Risk Factors:Hypertension, Dyslipidemia and GERD.  Sonographer:    Bernadene Person RDCS Referring Phys: [id number] [place]  1. Left ventricular ejection fraction, by estimation, is 60 to 65%. The left ventricle has normal function. The left ventricle has no regional wall motion abnormalities. Left ventricular diastolic parameters are consistent with Grade I diastolic dysfunction (impaired relaxation). The average left ventricular  global longitudinal strain is -17.6 %. The global longitudinal strain is normal.  2. Right ventricular systolic function is normal. The right ventricular size is normal.  3. The mitral valve is normal in structure. No evidence of mitral valve regurgitation. No evidence of mitral stenosis.  4. The aortic valve is normal in structure. Aortic valve regurgitation is not visualized. No aortic stenosis is present.  5. The inferior vena cava is normal in size with greater than 50% respiratory variability, suggesting right atrial pressure of 3 mmHg. FINDINGS  Left Ventricle: Left ventricular ejection fraction, by estimation, is 60 to 65%. The left ventricle has normal function. The left ventricle has no regional wall motion abnormalities. The average left ventricular global longitudinal strain is -17.6 %. The global longitudinal strain is normal. The left ventricular internal cavity size was normal in size. There is no left ventricular hypertrophy. Left ventricular diastolic parameters are consistent with Grade I diastolic dysfunction (impaired relaxation). Right Ventricle: The right ventricular size is normal. No increase in right ventricular wall thickness. Right ventricular systolic function is normal. Left Atrium: Left atrial size was normal in size. Right Atrium: Right atrial size was normal in size. Pericardium: There  is no evidence of pericardial effusion. Mitral Valve: The mitral valve is normal in structure. Normal mobility of the mitral valve leaflets. No evidence of mitral valve regurgitation. No evidence of mitral valve stenosis. Tricuspid Valve: The tricuspid valve is normal in structure. Tricuspid valve regurgitation is not demonstrated. No evidence of tricuspid stenosis. Aortic Valve: The aortic valve is normal in structure. Aortic valve regurgitation is not visualized. No aortic stenosis is present. Pulmonic Valve: The pulmonic valve was normal in structure. Pulmonic valve regurgitation is trivial. No  evidence of pulmonic stenosis. Aorta: The aortic root is normal in size and structure. Venous: The inferior vena cava is normal in size with greater than 50% respiratory variability, suggesting right atrial pressure of 3 mmHg. IAS/Shunts: No atrial level shunt detected by color flow Doppler.  LEFT VENTRICLE PLAX 2D LVIDd:         5.10 cm  Diastology LVIDs:         3.20 cm  LV e' lateral:   9.94 cm/s LV PW:         0.70 cm  LV E/e' lateral: 4.6 LV IVS:        0.70 cm  LV e' medial:    6.85 cm/s LVOT diam:     2.10 cm  LV E/e' medial:  6.6 LV SV:         81 LV SV Index:   43       2D Longitudinal Strain LVOT Area:     3.46 cm 2D Strain GLS Avg:     -17.6 %  RIGHT VENTRICLE RV S prime:     9.79 cm/s TAPSE (M-mode): 1.6 cm LEFT ATRIUM             Index       RIGHT ATRIUM           Index LA diam:        3.70 cm 1.94 cm/m  RA Area:     14.40 cm LA Vol (A2C):   69.5 ml 36.35 ml/m RA Volume:   31.90 ml  16.68 ml/m LA Vol (A4C):   51.0 ml 26.67 ml/m LA Biplane Vol: 59.4 ml 31.07 ml/m  AORTIC VALVE LVOT Vmax:   143.00 cm/s LVOT Vmean:  98.900 cm/s LVOT VTI:    0.235 m  AORTA Ao Root diam: 3.20 cm Ao Asc diam:  3.40 cm MITRAL VALVE MV Area (PHT): 2.22 cm    SHUNTS MV Decel Time: 342 msec    Systemic VTI:  0.24 m MV E velocity: 45.30 cm/s  Systemic Diam: 2.10 cm MV A velocity: 72.50 cm/s MV E/A ratio:  0.62 Candee Furbish MD Electronically signed by Candee Furbish MD Signature Date/Time: 05/22/2020/12:55:40 PM    Final    IR IMAGING GUIDED PORT INSERTION  Result Date: 05/19/2020 INDICATION: History of diffuse large B-cell lymphoma. In need of durable intravenous access for chemotherapy administration EXAM: IMPLANTED PORT A CATH PLACEMENT WITH ULTRASOUND AND FLUOROSCOPIC GUIDANCE COMPARISON:  PET-CT-04/28/2020 MEDICATIONS: Ancef 2 gm IV; The antibiotic was administered within an appropriate time interval prior to skin puncture. ANESTHESIA/SEDATION: Moderate (conscious) sedation was employed during this procedure. A total of  Versed 2 mg and Fentanyl 100 mcg was administered intravenously. Moderate Sedation Time: 25 minutes. The patient's level of consciousness and vital signs were monitored continuously by radiology nursing throughout the procedure under my direct supervision. CONTRAST:  None FLUOROSCOPY TIME:  24 seconds (5 mGy) COMPLICATIONS: None immediate. PROCEDURE: The procedure, risks, benefits, and alternatives were explained  to the patient. Questions regarding the procedure were encouraged and answered. The patient understands and consents to the procedure. The right neck and chest were prepped with chlorhexidine in a sterile fashion, and a sterile drape was applied covering the operative field. Maximum barrier sterile technique with sterile gowns and gloves were used for the procedure. A timeout was performed prior to the initiation of the procedure. Local anesthesia was provided with 1% lidocaine with epinephrine. After creating a small venotomy incision, a micropuncture kit was utilized to access the internal jugular vein. Real-time ultrasound guidance was utilized for vascular access including the acquisition of a permanent ultrasound image documenting patency of the accessed vessel. The microwire was utilized to measure appropriate catheter length. A subcutaneous port pocket was then created along the upper chest wall utilizing a combination of sharp and blunt dissection. The pocket was irrigated with sterile saline. A single lumen "Slim" sized power injectable port was chosen for placement. The 8 Fr catheter was tunneled from the port pocket site to the venotomy incision. The port was placed in the pocket. The external catheter was trimmed to appropriate length. At the venotomy, an 8 Fr peel-away sheath was placed over a guidewire under fluoroscopic guidance. The catheter was then placed through the sheath and the sheath was removed. Final catheter positioning was confirmed and documented with a fluoroscopic spot  radiograph. The port was accessed with a Huber needle, aspirated and flushed with heparinized saline. The venotomy site was closed with an interrupted 4-0 Vicryl suture. The port pocket incision was closed with interrupted 2-0 Vicryl suture. The skin was opposed with a running subcuticular 4-0 Vicryl suture. Dermabond and Steri-strips were applied to both incisions. Dressings were applied. The patient tolerated the procedure well without immediate post procedural complication. FINDINGS: After catheter placement, the tip lies within the superior cavoatrial junction. The catheter aspirates and flushes normally and is ready for immediate use. IMPRESSION: Successful placement of a right internal jugular approach power injectable Port-A-Cath. The catheter is ready for immediate use. Electronically Signed   By: Sandi Mariscal M.D.   On: 05/19/2020 15:59     CT Biopsy  Result Date: 05/08/2020 CLINICAL DATA:  13 cm left-sided mesenteric abdominal mass. EXAM: CT GUIDED CORE BIOPSY OF MESENTERIC MASS ANESTHESIA/SEDATION: 1.5 mg IV Versed; 75 mcg IV Fentanyl Total Moderate Sedation Time:  18 minutes. The patient's level of consciousness and physiologic status were continuously monitored during the procedure by Radiology nursing. PROCEDURE: The procedure risks, benefits, and alternatives were explained to the patient. Questions regarding the procedure were encouraged and answered. The patient understands and consents to the procedure. A time-out was performed prior to initiating the procedure. CT was performed through the abdomen in a supine position. The left abdominal wall was prepped with chlorhexidine in a sterile fashion, and a sterile drape was applied covering the operative field. A sterile gown and sterile gloves were used for the procedure. Local anesthesia was provided with 1% Lidocaine. Under CT guidance, a 17 gauge trocar needle was advanced to the level of a left-sided mesenteric mass. After confirming needle  tip position, 4 separate coaxial 18 gauge core biopsy samples were obtained and submitted in saline. Some Gel-Foam pledgets were advanced through the outer needle as the needle was retracted and removed. COMPLICATIONS: None FINDINGS: Large left-sided intra-abdominal mass within the mesentery measures up to 13.3 cm in maximum diameter. Solid tissue was obtained. IMPRESSION: CT-guided core biopsy performed of large left-sided mesenteric mass measuring just over 13 cm in  estimated maximal diameter. Electronically Signed   By: Aletta Edouard M.D.   On: 05/08/2020 15:02   ECHOCARDIOGRAM COMPLETE  Result Date: 05/22/2020    ECHOCARDIOGRAM REPORT   Patient Name:   Connor Adams [facility] Date of Exam: 05/22/2020 Medical Rec #:  [id number]        Height:       68.0 in Accession #:    [id number]       Weight:       171.0 lb Date of Birth:  1945/11/14        BSA:          1.912 m Patient Age:    67 years         BP:           116/77 mmHg Patient Gender: M                HR:           80 bpm. Exam Location:  Outpatient Procedure: 2D Echo, Cardiac Doppler and Color Doppler Indications:    Chemotherapy evaluation v87.41 / v58.11  History:        Patient has no prior history of Echocardiogram examinations. TIA                 and Stroke; Risk Factors:Hypertension, Dyslipidemia and GERD.  Sonographer:    Bernadene Person RDCS Referring Phys: [id number] [place]  1. Left ventricular ejection fraction, by estimation, is 60 to 65%. The left ventricle has normal function. The left ventricle has no regional wall motion abnormalities. Left ventricular diastolic parameters are consistent with Grade I diastolic dysfunction (impaired relaxation). The average left ventricular global longitudinal strain is -17.6 %. The global longitudinal strain is normal.  2. Right ventricular systolic function is normal. The right ventricular size is normal.  3. The mitral valve is normal in structure. No evidence of mitral valve regurgitation. No  evidence of mitral stenosis.  4. The aortic valve is normal in structure. Aortic valve regurgitation is not visualized. No aortic stenosis is present.  5. The inferior vena cava is normal in size with greater than 50% respiratory variability, suggesting right atrial pressure of 3 mmHg. FINDINGS  Left Ventricle: Left ventricular ejection fraction, by estimation, is 60 to 65%. The left ventricle has normal function. The left ventricle has no regional wall motion abnormalities. The average left ventricular global longitudinal strain is -17.6 %. The global longitudinal strain is normal. The left ventricular internal cavity size was normal in size. There is no left ventricular hypertrophy. Left ventricular diastolic parameters are consistent with Grade I diastolic dysfunction (impaired relaxation). Right Ventricle: The right ventricular size is normal. No increase in right ventricular wall thickness. Right ventricular systolic function is normal. Left Atrium: Left atrial size was normal in size. Right Atrium: Right atrial size was normal in size. Pericardium: There is no evidence of pericardial effusion. Mitral Valve: The mitral valve is normal in structure. Normal mobility of the mitral valve leaflets. No evidence of mitral valve regurgitation. No evidence of mitral valve stenosis. Tricuspid Valve: The tricuspid valve is normal in structure. Tricuspid valve regurgitation is not demonstrated. No evidence of tricuspid stenosis. Aortic Valve: The aortic valve is normal in structure. Aortic valve regurgitation is not visualized. No aortic stenosis is present. Pulmonic Valve: The pulmonic valve was normal in structure. Pulmonic valve regurgitation is trivial. No evidence of pulmonic stenosis. Aorta: The aortic root is normal in size and structure. Venous: The  inferior vena cava is normal in size with greater than 50% respiratory variability, suggesting right atrial pressure of 3 mmHg. IAS/Shunts: No atrial level shunt  detected by color flow Doppler.  LEFT VENTRICLE PLAX 2D LVIDd:         5.10 cm  Diastology LVIDs:         3.20 cm  LV e' lateral:   9.94 cm/s LV PW:         0.70 cm  LV E/e' lateral: 4.6 LV IVS:        0.70 cm  LV e' medial:    6.85 cm/s LVOT diam:     2.10 cm  LV E/e' medial:  6.6 LV SV:         81 LV SV Index:   43       2D Longitudinal Strain LVOT Area:     3.46 cm 2D Strain GLS Avg:     -17.6 %  RIGHT VENTRICLE RV S prime:     9.79 cm/s TAPSE (M-mode): 1.6 cm LEFT ATRIUM             Index       RIGHT ATRIUM           Index LA diam:        3.70 cm 1.94 cm/m  RA Area:     14.40 cm LA Vol (A2C):   69.5 ml 36.35 ml/m RA Volume:   31.90 ml  16.68 ml/m LA Vol (A4C):   51.0 ml 26.67 ml/m LA Biplane Vol: 59.4 ml 31.07 ml/m  AORTIC VALVE LVOT Vmax:   143.00 cm/s LVOT Vmean:  98.900 cm/s LVOT VTI:    0.235 m  AORTA Ao Root diam: 3.20 cm Ao Asc diam:  3.40 cm MITRAL VALVE MV Area (PHT): 2.22 cm    SHUNTS MV Decel Time: 342 msec    Systemic VTI:  0.24 m MV E velocity: 45.30 cm/s  Systemic Diam: 2.10 cm MV A velocity: 72.50 cm/s MV E/A ratio:  0.62 Candee Furbish MD Electronically signed by Candee Furbish MD Signature Date/Time: 05/22/2020/12:55:40 PM    Final    IR IMAGING GUIDED PORT INSERTION  Result Date: 05/19/2020 INDICATION: History of diffuse large B-cell lymphoma. In need of durable intravenous access for chemotherapy administration EXAM: IMPLANTED PORT A CATH PLACEMENT WITH ULTRASOUND AND FLUOROSCOPIC GUIDANCE COMPARISON:  PET-CT-04/28/2020 MEDICATIONS: Ancef 2 gm IV; The antibiotic was administered within an appropriate time interval prior to skin puncture. ANESTHESIA/SEDATION: Moderate (conscious) sedation was employed during this procedure. A total of Versed 2 mg and Fentanyl 100 mcg was administered intravenously. Moderate Sedation Time: 25 minutes. The patient's level of consciousness and vital signs were monitored continuously by radiology nursing throughout the procedure under my direct supervision.  CONTRAST:  None FLUOROSCOPY TIME:  24 seconds (5 mGy) COMPLICATIONS: None immediate. PROCEDURE: The procedure, risks, benefits, and alternatives were explained to the patient. Questions regarding the procedure were encouraged and answered. The patient understands and consents to the procedure. The right neck and chest were prepped with chlorhexidine in a sterile fashion, and a sterile drape was applied covering the operative field. Maximum barrier sterile technique with sterile gowns and gloves were used for the procedure. A timeout was performed prior to the initiation of the procedure. Local anesthesia was provided with 1% lidocaine with epinephrine. After creating a small venotomy incision, a micropuncture kit was utilized to access the internal jugular vein. Real-time ultrasound guidance was utilized for vascular access including the acquisition of  a permanent ultrasound image documenting patency of the accessed vessel. The microwire was utilized to measure appropriate catheter length. A subcutaneous port pocket was then created along the upper chest wall utilizing a combination of sharp and blunt dissection. The pocket was irrigated with sterile saline. A single lumen "Slim" sized power injectable port was chosen for placement. The 8 Fr catheter was tunneled from the port pocket site to the venotomy incision. The port was placed in the pocket. The external catheter was trimmed to appropriate length. At the venotomy, an 8 Fr peel-away sheath was placed over a guidewire under fluoroscopic guidance. The catheter was then placed through the sheath and the sheath was removed. Final catheter positioning was confirmed and documented with a fluoroscopic spot radiograph. The port was accessed with a Huber needle, aspirated and flushed with heparinized saline. The venotomy site was closed with an interrupted 4-0 Vicryl suture. The port pocket incision was closed with interrupted 2-0 Vicryl suture. The skin was opposed  with a running subcuticular 4-0 Vicryl suture. Dermabond and Steri-strips were applied to both incisions. Dressings were applied. The patient tolerated the procedure well without immediate post procedural complication. FINDINGS: After catheter placement, the tip lies within the superior cavoatrial junction. The catheter aspirates and flushes normally and is ready for immediate use. IMPRESSION: Successful placement of a right internal jugular approach power injectable Port-A-Cath. The catheter is ready for immediate use. Electronically Signed   By: Sandi Mariscal M.D.   On: 05/19/2020 15:59    Assessment and Plan:  Connor Adams 74 y.o. male with medical history significant for Stage I DLBCL who presents for a follow up visit.  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  He is currently receiving cycle #5 of EPOCH-R starting on 08/18/2020.   Today he is Cycle 5 Day 3 of treatment.  He is tolerating his chemotherapy well so far with no nausea or vomiting.  Bowels are moving and he has a good appetite.  There are no barriers to proceeding with treatment.      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 Stage I bulky disease.   --Continue EPOCH-R. Today is Cycle 5 Day 3 --Adverse effects have been reviewed including, but not limited to, alopecia, myelosuppression, peripheral neuropathy, mucositis, liver/renal dysfunction, and possible infusion reaction associated with Rituxan. He agrees to proceed. --PAC in place and ready for use. --Echocardiogram from 05/22/2020 shows LVEF of 60-65%  --CBC adequate to proceed with treatment today.   Leukocytosis likely related to steroids.  Anemia slightly worse this morning.  He is asymptomatic.  Discussed with patient that he may need PRBC transfusion this admission.  Plan to transfuse for hemoglobin less than 7. --Will monitor closely with CBC with diff, CMET, LDH, and uric acid --Lovenox for DVT prophylaxis. --Continue home medications for HTN and hyperlipidemia.  --Continue PRN antiemetics. --Continue MiraLAX and Senokot. --completion of chemotherapy and hospital discharged planned for 08/22/2020.   #Pulmonary nodules of unclear etiology --PET scan from 08/15/2020 showed new bilateral pulmonary nodules --Etiology unclear also etiologies could include infection, metastatic spread of lymphoma, or new metastatic disease from other primary --Appreciate pulmonology consult.  They  recommend repeat CT scan of the chest in approximately 6 weeks to reevaluate.  The patient is scheduled for labs, follow-up visit, Rituxan, and Neulasta on Monday, 08/25/2020.  [name], MD Department of Hematology/Oncology [place] at [facility] Phone: [phone] Pager: [phone] Email: [email or web address]

## 2020-08-20 NOTE — Plan of Care (Signed)
  Problem: Education: Goal: Knowledge of General Education information will improve Description: Including pain rating scale, medication(s)/side effects and non-pharmacologic comfort measures Outcome: Progressing   Problem: Health Behavior/Discharge Planning: Goal: Ability to manage health-related needs will improve Outcome: Progressing   Problem: Clinical Measurements: Goal: Ability to maintain clinical measurements within normal limits will improve Outcome: Progressing Goal: Will remain free from infection Outcome: Progressing Goal: Diagnostic test results will improve Outcome: Progressing Goal: Respiratory complications will improve Outcome: Progressing Goal: Cardiovascular complication will be avoided Outcome: Progressing   Problem: Activity: Goal: Risk for activity intolerance will decrease Outcome: Progressing   Problem: Nutrition: Goal: Adequate nutrition will be maintained Outcome: Progressing   Problem: Coping: Goal: Level of anxiety will decrease Outcome: Progressing   Problem: Elimination: Goal: Will not experience complications related to bowel motility Outcome: Progressing Goal: Will not experience complications related to urinary retention Outcome: Progressing   Problem: Pain Managment: Goal: General experience of comfort will improve Outcome: Progressing   Problem: Safety: Goal: Ability to remain free from injury will improve Outcome: Progressing   Problem: Skin Integrity: Goal: Risk for impaired skin integrity will decrease Outcome: Progressing   Problem: Education: Goal: Knowledge of the prescribed therapeutic regimen will improve Outcome: Progressing   Problem: Activity: Goal: Ability to implement measures to reduce episodes of fatigue will improve Outcome: Progressing   Problem: Bowel/Gastric: Goal: Will not experience complications related to bowel motility Outcome: Progressing   Problem: Coping: Goal: Ability to identify and develop  effective coping behavior will improve Outcome: Progressing   Problem: Nutritional: Goal: Maintenance of adequate nutrition will improve Outcome: Progressing   

## 2020-08-21 LAB — CBC WITH DIFFERENTIAL/PLATELET
Abs Immature Granulocytes: 0.22 10*3/uL — ABNORMAL HIGH (ref 0.00–0.07)
Basophils Absolute: 0 10*3/uL (ref 0.0–0.1)
Basophils Relative: 0 %
Eosinophils Absolute: 0 10*3/uL (ref 0.0–0.5)
Eosinophils Relative: 0 %
HCT: 24.2 % — ABNORMAL LOW (ref 39.0–52.0)
Hemoglobin: 7.5 g/dL — ABNORMAL LOW (ref 13.0–17.0)
Immature Granulocytes: 2 %
Lymphocytes Relative: 2 %
Lymphs Abs: 0.2 10*3/uL — ABNORMAL LOW (ref 0.7–4.0)
MCH: 29.3 pg (ref 26.0–34.0)
MCHC: 31 g/dL (ref 30.0–36.0)
MCV: 94.5 fL (ref 80.0–100.0)
Monocytes Absolute: 0.3 10*3/uL (ref 0.1–1.0)
Monocytes Relative: 3 %
Neutro Abs: 12.1 10*3/uL — ABNORMAL HIGH (ref 1.7–7.7)
Neutrophils Relative %: 93 %
Platelets: 315 10*3/uL (ref 150–400)
RBC: 2.56 MIL/uL — ABNORMAL LOW (ref 4.22–5.81)
RDW: 20.6 % — ABNORMAL HIGH (ref 11.5–15.5)
WBC: 12.9 10*3/uL — ABNORMAL HIGH (ref 4.0–10.5)
nRBC: 0 % (ref 0.0–0.2)

## 2020-08-21 LAB — COMPREHENSIVE METABOLIC PANEL
ALT: 18 U/L (ref 0–44)
AST: 15 U/L (ref 15–41)
Albumin: 3.1 g/dL — ABNORMAL LOW (ref 3.5–5.0)
Alkaline Phosphatase: 47 U/L (ref 38–126)
Anion gap: 7 (ref 5–15)
BUN: 32 mg/dL — ABNORMAL HIGH (ref 8–23)
CO2: 25 mmol/L (ref 22–32)
Calcium: 8.7 mg/dL — ABNORMAL LOW (ref 8.9–10.3)
Chloride: 109 mmol/L (ref 98–111)
Creatinine, Ser: 1.1 mg/dL (ref 0.61–1.24)
GFR, Estimated: >60 mL/min (ref >60)
Glucose, Bld: 112 mg/dL — ABNORMAL HIGH (ref 70–99)
Potassium: 3.8 mmol/L (ref 3.5–5.1)
Sodium: 141 mmol/L (ref 135–145)
Total Bilirubin: 0.4 mg/dL (ref 0.3–1.2)
Total Protein: 5.1 g/dL — ABNORMAL LOW (ref 6.5–8.1)

## 2020-08-21 LAB — LACTATE DEHYDROGENASE: LDH: 116 U/L (ref 98–192)

## 2020-08-21 LAB — URIC ACID: Uric Acid, Serum: 6.5 mg/dL (ref 3.7–8.6)

## 2020-08-21 MED ORDER — SODIUM CHLORIDE 0.9 % IV SOLN
Freq: Once | INTRAVENOUS | Status: AC
  Administered 2020-08-22: 8 mg via INTRAVENOUS
  Filled 2020-08-21: qty 8

## 2020-08-21 MED ORDER — SODIUM CHLORIDE 0.9 % IV SOLN
1040.0000 mg/m2 | Freq: Once | INTRAVENOUS | Status: AC
  Administered 2020-08-22: 2000 mg via INTRAVENOUS
  Filled 2020-08-21: qty 100

## 2020-08-21 MED ORDER — VINCRISTINE SULFATE CHEMO INJECTION 1 MG/ML
Freq: Once | INTRAVENOUS | Status: AC
  Filled 2020-08-21: qty 14

## 2020-08-21 MED ORDER — SODIUM CHLORIDE 0.9 % IV SOLN
Freq: Once | INTRAVENOUS | Status: AC
  Administered 2020-08-21: 18 mg via INTRAVENOUS
  Filled 2020-08-21: qty 4

## 2020-08-21 NOTE — Care Management Important Message (Signed)
Important Message  Patient Details IM Letter given to the Patient. Name: Connor Adams MRN: [id number] Date of Birth: Mar 12, 1946   Medicare Important Message Given:  Yes     [name] 08/21/2020, 9:33 AM

## 2020-08-21 NOTE — Progress Notes (Signed)
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   1) 04/17/2020: CT A/P showed dominant nodal mass(11.5 x 10.9 cm)within the jejunal mesentery, highly suspicious for lymphoma. Additionally there are left lower lobe pleural-based pulmonary nodules 2) 04/24/2020: establish care with Dr. Lorenso Courier 3) 04/28/2020:  PET CT scan shows Large solid left mesenteric mass 13.0 cm with maximum SUV of 21.2, [place] 5 with local hypermetabolic porta hepatis and retroperitoneal lymph nodes. Constitutes bulky Stage I disease.   Reason for Admission: double hit lymphoma, Cycle #5 EPOCH-R  HPI: Connor Adams is a 74 year old male with medical history significant for double hit DLBCL. He presents for Cycle 5 of R-EPOCH chemotherapy. Risks and benefits have been discussed and he has agreed to proceed  Interval History: The patient continues to tolerate his chemotherapy well overall.  Denies mucositis, nausea, vomiting.  Bowels are moving without any difficulty.  Denies fevers, chills, cough, shortness of breath.  He continues to maintain a good energy level and is ambulating in the hallway without any difficulty.     Diagnosis Date  . Arthritis   . Chronic kidney disease (CKD), stage III (moderate)   . COVID-19   . GERD (gastroesophageal reflux disease)    occ  . Hemorrhoids   . History of ETT 3/05   low risk  . History of hiatal hernia   . History of kidney stones   . HLD (hyperlipidemia)   . HTN (hypertension)   . Left inguinal hernia   . Melanoma ([place])    excied  . 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 Stroke [facility]) 2011   tia   . TIA (transient ischemic attack) 4/11   associated w slurred speecha ndmild facial droop. lasted for about 10 minutes. Had  MRI, etc with [facility] that per his report was ok (dr. Stephannie Li). Echo (4/11): EF 55-60%, no regional WMAs, normal diastolic function, mild LAE, no source of embolus  Past Surgical History:  Procedure Laterality Date  . BMI 30.2 muscular  12/08/04  . curretage actinic keratosis R ear  01/20/05  . ETT low prob of CAD  11/21/03  . excision melanoma in situ back  01/20/05  . hemorrhoids sclerosed at colonoscopy  12/19/05  . IR IMAGING GUIDED PORT INSERTION  05/19/2020  . LAPAROSCOPIC INGUINAL HERNIA REPAIR Left 12/20/1995  . LUMBAR DISC SURGERY    . NERVE REPAIR     back of neck  . retinal laser surgery Left 09/20/1986  . SEPTOPLASTY  11/19/98  . TOTAL KNEE ARTHROPLASTY Left 12/13/2016   Procedure: TOTAL KNEE ARTHROPLASTY WITH RIGHT KNEE CORTISONE INJECTION;  Surgeon: Frederik Pear, MD;  Location: [facility];  Service: Orthopedics;  Laterality: Left;  . uric acid 6.9  12/22/04  . x-ray l great toe  07/21/00       Fre Past Surgical History:  Procedure Laterality Date  . BMI 30.2 muscular  12/08/04  . curretage actinic keratosis R ear  01/20/05  . ETT low prob of CAD  11/21/03  . excision melanoma in situ back  01/20/05  . hemorrhoids sclerosed at colonoscopy  12/19/05  . IR IMAGING GUIDED PORT INSERTION  05/19/2020  . LAPAROSCOPIC INGUINAL HERNIA REPAIR Left 12/20/1995  . LUMBAR DISC SURGERY    . NERVE REPAIR  back of neck  . retinal laser surgery Left 09/20/1986  . SEPTOPLASTY  11/19/98  . TOTAL KNEE ARTHROPLASTY Left 12/13/2016   Procedure: TOTAL KNEE ARTHROPLASTY WITH RIGHT KNEE CORTISONE INJECTION;  Surgeon: Frederik Pear, MD;  Location: [place];  Service: Orthopedics;  Laterality: Left;  . uric acid 6.9  12/22/04  . x-ray l great toe  07/21/00  quency Provider Last Rate Last Admin  . 0.9 %  sodium chloride infusion   Intravenous Continuous [name] IV, MD 20 mL/hr at 05/29/20 0400 Rate Verify at 05/29/20 0400  . allopurinol (ZYLOPRIM) tablet 300 mg  300 mg Oral Daily [name] R, NP   300  mg at 05/29/20 1038  . aspirin tablet 325 mg  325 mg Oral Q4H PRN Maryanna Shape, NP      . Chlorhexidine Gluconate Cloth 2 % PADS 6 each  6 each Topical Daily Maryanna Shape, NP   6 each at 05/29/20 1038  . Cold Pack 1 packet  1 packet Topical Once PRN [name] IV, MD      . DOXOrubicin (ADRIAMYCIN) 20 mg, etoposide (VEPESID) 96 mg, vinCRIStine (ONCOVIN) 0.8 mg in sodium chloride 0.9 % 1,000 mL chemo infusion   Intravenous Once Orson Slick, MD 51 mL/hr at 05/28/20 1422 New Bag at 05/28/20 1422  . DOXOrubicin (ADRIAMYCIN) 20 mg, etoposide (VEPESID) 96 mg, vinCRIStine (ONCOVIN) 0.8 mg in sodium chloride 0.9 % 1,000 mL chemo infusion   Intravenous Once Narda Rutherford T IV, MD      . enoxaparin (LOVENOX) injection 40 mg  40 mg Subcutaneous Q24H [name] R, NP   40 mg at 05/28/20 1116  . Hot Pack 1 packet  1 packet Topical Once PRN [name] IV, MD      . hydrocortisone (ANUSOL-HC) 2.5 % rectal cream 1 application  1 application Rectal Daily PRN Maryanna Shape, NP      . losartan (COZAAR) tablet 25 mg  25 mg Oral Daily [name] R, NP   25 mg at 05/29/20 1038  . multivitamin with minerals tablet 1 tablet  1 tablet Oral Daily Maryanna Shape, NP   1 tablet at 05/29/20 1038  . ondansetron (ZOFRAN-ODT) disintegrating tablet 8 mg  8 mg Oral Q8H PRN Chrisann Melaragno R, NP      . pantoprazole (PROTONIX) EC tablet 40 mg  40 mg Oral BID [name] R, NP   40 mg at 05/29/20 1038  . polyethylene glycol (MIRALAX / GLYCOLAX) packet 17 g  17 g Oral Daily PRN Narda Rutherford T IV, MD      . predniSONE (DELTASONE) tablet 120 mg  120 mg Oral Q breakfast [name] IV, MD   120 mg at 05/29/20 0819  . prochlorperazine (COMPAZINE) tablet 10 mg  10 mg Oral Q6H PRN [name] R, NP      . rosuvastatin (CRESTOR) tablet 5 mg  5 mg Oral QPM Karn Derk R, NP   5 mg at 05/28/20 1744  . senna-docusate (Senokot-S) tablet 2 tablet  2 tablet Oral BID Orson Slick, MD   2  tablet at 05/29/20 1038  . sodium bicarbonate/sodium chloride mouthwash 1035ml   Mouth Rinse PRN Talar Fraley R, NP      . sodium chloride flush (NS) 0.9 % injection 10-40 mL  10-40 mL Intracatheter Q12H Maryanna Shape, NP   10 mL at 05/28/20 2129  . sodium chloride flush (NS) 0.9 %  injection 10-40 mL  10-40 mL Intracatheter PRN Letcher Schweikert R, NP      . sucralfate (CARAFATE) 1 GM/10ML suspension 1 g  1 g Oral TID WC & HS Maryanna Shape, NP   1 g at 05/29/20 1219       Family History  Problem Relation Age of Onset  . Liver cancer Sister   . Heart failure Brother 44       CABGx4  . Heart attack Mother 74  . Uterine cancer Mother   . Heart attack Father 74  . Asthma Son   . Diabetes Brother   . Prostate cancer Brother   Relation Age of Onset  . Liver cancer Sister   . Heart failure Brother 44       CABGx4  . Heart attack Mother 53  . Uterine cancer Mother   . Heart attack Father 41  . Asthma Son   . Diabetes Brother   . Prostate cancer Brother      Socioeconomic History  . Marital status: Married    Spouse name: [name]  . Number of children: 2  . Years of education: 86  . Highest education level: Not on file  Occupational History  . Occupation: Systems developer: [employer]  Tobacco Use  . Smoking status: Never Smoker  . Smokeless tobacco: Never Used  Vaping Use  . Vaping Use: Never used  Substance and Sexual Activity  . Alcohol use: No  . Drug use: No  . Sexual activity: Not on file  Other Topics Concern  . Not on file  Social History Narrative   Married to [employer] at [employer].   Son, [name] married   Son, [name], Married.    Caffeine use: 1 cup coffee per day   Social Determinants of Health   Financial Resource Strain:   . Difficulty of Paying Living Expenses: Not on file  Food Insecurity:   . Worried About Charity fundraiser in the Last Year: Not on file  . Ran Out of Food in the Last Year: Not on file   Transportation Needs:   . Lack of Transportation (Medical): Not on file  . Lack of Transportation (Non-Medical): Not on file  Physical Activity:   . Days of Exercise per Week: Not on file  . Minutes of Exercise per Session: Not on file  Stress:   . Feeling of Stress : Not on file  Social Connections:   . Frequency of Communication with Friends and Family: Not on file  . Frequency of Social Gatherings with Friends and Family: Not on file  . Attends Religious Services: Not on file  . Active Member of Clubs or Organizations: Not on file  . Attends Archivist Meetings: Not on file  . Marital Status: Not on file  Intimate Partner Violence:   . Fear of Current or Ex-Partner: Not on file  . Emotionally Abused: Not on file  . Physically Abused: Not on file  . Sexually Abused: Not on file  :  Review of Systems: A comprehensive 14 point review of systems was negative except as noted in the HPI.  Exam: Patient Vitals for the past 24 hrs:  BP Temp Temp src Pulse Resp SpO2  05/29/20 0600 107/60 98 F (36.7 C) Oral (!) 56 20 97 %  05/28/20 2222 125/68 (!) 97.4 F (36.3 C) Oral 60 20 98 %  05/28/20 1503 113/75 (!) 97.4 F (  36.3 C) Oral (!) 53 18 96 %    General:  well-nourished in no acute distress.   Eyes:  no scleral icterus.   ENT:  There were no oropharyngeal lesions.   Neck was without thyromegaly.   Lymphatics:  Negative cervical, supraclavicular or axillary adenopathy.   Respiratory: lungs were clear bilaterally without wheezing or crackles.   Cardiovascular:  Regular rate and rhythm, S1/S2, without murmur, rub or gallop.  There was no pedal edema.   GI:  Positive bowel sounds.  Left upper quadrant mass not palpable. Musculoskeletal:  no spinal tenderness of palpation of vertebral spine.   Skin exam was without echymosis, petichae.   Neuro exam was nonfocal. Patient was alert and oriented.  Attention was good.   Language was appropriate.  Mood was normal without  depression.  Speech was not pressured.  Thought content was not tangential.     Lab Results  Component Value Date   WBC 17.9 (H) 05/29/2020   HGB 9.8 (L) 05/29/2020   HCT 30.3 (L) 05/29/2020   PLT 351 05/29/2020   GLUCOSE 141 (H) 05/29/2020   CHOL  12/31/2009    160        ATP III CLASSIFICATION:  <200     mg/dL   Desirable  200-239  mg/dL   Borderline High  >=240    mg/dL   High          TRIG 213 (H) 12/31/2009   HDL 28 (L) 12/31/2009   LDLCALC  12/31/2009    89        Total Cholesterol/HDL:CHD Risk Coronary Heart Disease Risk Table                     Men   Women  1/2 Average Risk   3.4   3.3  Average Risk       5.0   4.4  2 X Average Risk   9.6   7.1  3 X Average Risk  23.4   11.0        Use the calculated Patient Ratio above and the CHD Risk Table to determine the patient's CHD Risk.        ATP III CLASSIFICATION (LDL):  <100     mg/dL   Optimal  100-129  mg/dL   Near or Above                    Optimal  130-159  mg/dL   Borderline  160-189  mg/dL   High  >190     mg/dL   Very High   ALT 34 05/29/2020   AST 29 05/29/2020   NA 139 05/29/2020   K 4.0 05/29/2020   CL 109 05/29/2020   CREATININE 1.09 05/29/2020   BUN 27 (H) 05/29/2020   CO2 24 05/29/2020    CT Biopsy  Result Date: 05/08/2020 CLINICAL DATA:  13 cm left-sided mesenteric abdominal mass. EXAM: CT GUIDED CORE BIOPSY OF MESENTERIC MASS ANESTHESIA/SEDATION: 1.5 mg IV Versed; 75 mcg IV Fentanyl Total Moderate Sedation Time:  18 minutes. The patient's level of consciousness and physiologic status were continuously monitored during the procedure by Radiology nursing. PROCEDURE: The procedure risks, benefits, and alternatives were explained to the patient. Questions regarding the procedure were encouraged and answered. The patient understands and consents to the procedure. A time-out was performed prior to initiating the procedure. CT was performed through the abdomen in a supine position. The left abdominal  wall was prepped  with chlorhexidine in a sterile fashion, and a sterile drape was applied covering the operative field. A sterile gown and sterile gloves were used for the procedure. Local anesthesia was provided with 1% Lidocaine. Under CT guidance, a 17 gauge trocar needle was advanced to the level of a left-sided mesenteric mass. After confirming needle tip position, 4 separate coaxial 18 gauge core biopsy samples were obtained and submitted in saline. Some Gel-Foam pledgets were advanced through the outer needle as the needle was retracted and removed. COMPLICATIONS: None FINDINGS: Large left-sided intra-abdominal mass within the mesentery measures up to 13.3 cm in maximum diameter. Solid tissue was obtained. IMPRESSION: CT-guided core biopsy performed of large left-sided mesenteric mass measuring just over 13 cm in estimated maximal diameter. Electronically Signed   By: Aletta Edouard M.D.   On: 05/08/2020 15:02   ECHOCARDIOGRAM COMPLETE  Result Date: 05/22/2020    ECHOCARDIOGRAM REPORT   Patient Name:   Connor Adams [facility] Date of Exam: 05/22/2020 Medical Rec #:  [id number]        Height:       68.0 in Accession #:    [id number]       Weight:       171.0 lb Date of Birth:  1946-03-29        BSA:          1.912 m Patient Age:    56 years         BP:           116/77 mmHg Patient Gender: M                HR:           80 bpm. Exam Location:  Outpatient Procedure: 2D Echo, Cardiac Doppler and Color Doppler Indications:    Chemotherapy evaluation v87.41 / v58.11  History:        Patient has no prior history of Echocardiogram examinations. TIA                 and Stroke; Risk Factors:Hypertension, Dyslipidemia and GERD.  Sonographer:    Bernadene Person RDCS Referring Phys: [id number] [place]  1. Left ventricular ejection fraction, by estimation, is 60 to 65%. The left ventricle has normal function. The left ventricle has no regional wall motion abnormalities. Left ventricular diastolic parameters are  consistent with Grade I diastolic dysfunction (impaired relaxation). The average left ventricular global longitudinal strain is -17.6 %. The global longitudinal strain is normal.  2. Right ventricular systolic function is normal. The right ventricular size is normal.  3. The mitral valve is normal in structure. No evidence of mitral valve regurgitation. No evidence of mitral stenosis.  4. The aortic valve is normal in structure. Aortic valve regurgitation is not visualized. No aortic stenosis is present.  5. The inferior vena cava is normal in size with greater than 50% respiratory variability, suggesting right atrial pressure of 3 mmHg. FINDINGS  Left Ventricle: Left ventricular ejection fraction, by estimation, is 60 to 65%. The left ventricle has normal function. The left ventricle has no regional wall motion abnormalities. The average left ventricular global longitudinal strain is -17.6 %. The global longitudinal strain is normal. The left ventricular internal cavity size was normal in size. There is no left ventricular hypertrophy. Left ventricular diastolic parameters are consistent with Grade I diastolic dysfunction (impaired relaxation). Right Ventricle: The right ventricular size is normal. No increase in right ventricular wall thickness. Right ventricular systolic function is normal.  Left Atrium: Left atrial size was normal in size. Right Atrium: Right atrial size was normal in size. Pericardium: There is no evidence of pericardial effusion. Mitral Valve: The mitral valve is normal in structure. Normal mobility of the mitral valve leaflets. No evidence of mitral valve regurgitation. No evidence of mitral valve stenosis. Tricuspid Valve: The tricuspid valve is normal in structure. Tricuspid valve regurgitation is not demonstrated. No evidence of tricuspid stenosis. Aortic Valve: The aortic valve is normal in structure. Aortic valve regurgitation is not visualized. No aortic stenosis is present. Pulmonic  Valve: The pulmonic valve was normal in structure. Pulmonic valve regurgitation is trivial. No evidence of pulmonic stenosis. Aorta: The aortic root is normal in size and structure. Venous: The inferior vena cava is normal in size with greater than 50% respiratory variability, suggesting right atrial pressure of 3 mmHg. IAS/Shunts: No atrial level shunt detected by color flow Doppler.  LEFT VENTRICLE PLAX 2D LVIDd:         5.10 cm  Diastology LVIDs:         3.20 cm  LV e' lateral:   9.94 cm/s LV PW:         0.70 cm  LV E/e' lateral: 4.6 LV IVS:        0.70 cm  LV e' medial:    6.85 cm/s LVOT diam:     2.10 cm  LV E/e' medial:  6.6 LV SV:         81 LV SV Index:   43       2D Longitudinal Strain LVOT Area:     3.46 cm 2D Strain GLS Avg:     -17.6 %  RIGHT VENTRICLE RV S prime:     9.79 cm/s TAPSE (M-mode): 1.6 cm LEFT ATRIUM             Index       RIGHT ATRIUM           Index LA diam:        3.70 cm 1.94 cm/m  RA Area:     14.40 cm LA Vol (A2C):   69.5 ml 36.35 ml/m RA Volume:   31.90 ml  16.68 ml/m LA Vol (A4C):   51.0 ml 26.67 ml/m LA Biplane Vol: 59.4 ml 31.07 ml/m  AORTIC VALVE LVOT Vmax:   143.00 cm/s LVOT Vmean:  98.900 cm/s LVOT VTI:    0.235 m  AORTA Ao Root diam: 3.20 cm Ao Asc diam:  3.40 cm MITRAL VALVE MV Area (PHT): 2.22 cm    SHUNTS MV Decel Time: 342 msec    Systemic VTI:  0.24 m MV E velocity: 45.30 cm/s  Systemic Diam: 2.10 cm MV A velocity: 72.50 cm/s MV E/A ratio:  0.62 Candee Furbish MD Electronically signed by Candee Furbish MD Signature Date/Time: 05/22/2020/12:55:40 PM    Final    IR IMAGING GUIDED PORT INSERTION  Result Date: 05/19/2020 INDICATION: History of diffuse large B-cell lymphoma. In need of durable intravenous access for chemotherapy administration EXAM: IMPLANTED PORT A CATH PLACEMENT WITH ULTRASOUND AND FLUOROSCOPIC GUIDANCE COMPARISON:  PET-CT-04/28/2020 MEDICATIONS: Ancef 2 gm IV; The antibiotic was administered within an appropriate time interval prior to skin puncture.  ANESTHESIA/SEDATION: Moderate (conscious) sedation was employed during this procedure. A total of Versed 2 mg and Fentanyl 100 mcg was administered intravenously. Moderate Sedation Time: 25 minutes. The patient's level of consciousness and vital signs were monitored continuously by radiology nursing throughout the procedure under my direct supervision. CONTRAST:  None FLUOROSCOPY TIME:  24 seconds (5 mGy) COMPLICATIONS: None immediate. PROCEDURE: The procedure, risks, benefits, and alternatives were explained to the patient. Questions regarding the procedure were encouraged and answered. The patient understands and consents to the procedure. The right neck and chest were prepped with chlorhexidine in a sterile fashion, and a sterile drape was applied covering the operative field. Maximum barrier sterile technique with sterile gowns and gloves were used for the procedure. A timeout was performed prior to the initiation of the procedure. Local anesthesia was provided with 1% lidocaine with epinephrine. After creating a small venotomy incision, a micropuncture kit was utilized to access the internal jugular vein. Real-time ultrasound guidance was utilized for vascular access including the acquisition of a permanent ultrasound image documenting patency of the accessed vessel. The microwire was utilized to measure appropriate catheter length. A subcutaneous port pocket was then created along the upper chest wall utilizing a combination of sharp and blunt dissection. The pocket was irrigated with sterile saline. A single lumen "Slim" sized power injectable port was chosen for placement. The 8 Fr catheter was tunneled from the port pocket site to the venotomy incision. The port was placed in the pocket. The external catheter was trimmed to appropriate length. At the venotomy, an 8 Fr peel-away sheath was placed over a guidewire under fluoroscopic guidance. The catheter was then placed through the sheath and the sheath was  removed. Final catheter positioning was confirmed and documented with a fluoroscopic spot radiograph. The port was accessed with a Huber needle, aspirated and flushed with heparinized saline. The venotomy site was closed with an interrupted 4-0 Vicryl suture. The port pocket incision was closed with interrupted 2-0 Vicryl suture. The skin was opposed with a running subcuticular 4-0 Vicryl suture. Dermabond and Steri-strips were applied to both incisions. Dressings were applied. The patient tolerated the procedure well without immediate post procedural complication. FINDINGS: After catheter placement, the tip lies within the superior cavoatrial junction. The catheter aspirates and flushes normally and is ready for immediate use. IMPRESSION: Successful placement of a right internal jugular approach power injectable Port-A-Cath. The catheter is ready for immediate use. Electronically Signed   By: Sandi Mariscal M.D.   On: 05/19/2020 15:59     CT Biopsy  Result Date: 05/08/2020 CLINICAL DATA:  13 cm left-sided mesenteric abdominal mass. EXAM: CT GUIDED CORE BIOPSY OF MESENTERIC MASS ANESTHESIA/SEDATION: 1.5 mg IV Versed; 75 mcg IV Fentanyl Total Moderate Sedation Time:  18 minutes. The patient's level of consciousness and physiologic status were continuously monitored during the procedure by Radiology nursing. PROCEDURE: The procedure risks, benefits, and alternatives were explained to the patient. Questions regarding the procedure were encouraged and answered. The patient understands and consents to the procedure. A time-out was performed prior to initiating the procedure. CT was performed through the abdomen in a supine position. The left abdominal wall was prepped with chlorhexidine in a sterile fashion, and a sterile drape was applied covering the operative field. A sterile gown and sterile gloves were used for the procedure. Local anesthesia was provided with 1% Lidocaine. Under CT guidance, a 17 gauge trocar  needle was advanced to the level of a left-sided mesenteric mass. After confirming needle tip position, 4 separate coaxial 18 gauge core biopsy samples were obtained and submitted in saline. Some Gel-Foam pledgets were advanced through the outer needle as the needle was retracted and removed. COMPLICATIONS: None FINDINGS: Large left-sided intra-abdominal mass within the mesentery measures up to 13.3 cm in maximum diameter.  Solid tissue was obtained. IMPRESSION: CT-guided core biopsy performed of large left-sided mesenteric mass measuring just over 13 cm in estimated maximal diameter. Electronically Signed   By: Aletta Edouard M.D.   On: 05/08/2020 15:02   ECHOCARDIOGRAM COMPLETE  Result Date: 05/22/2020    ECHOCARDIOGRAM REPORT   Patient Name:   Connor Adams [facility] Date of Exam: 05/22/2020 Medical Rec #:  [id number]        Height:       68.0 in Accession #:    [id number]       Weight:       171.0 lb Date of Birth:  1945-12-31        BSA:          1.912 m Patient Age:    73 years         BP:           116/77 mmHg Patient Gender: M                HR:           80 bpm. Exam Location:  Outpatient Procedure: 2D Echo, Cardiac Doppler and Color Doppler Indications:    Chemotherapy evaluation v87.41 / v58.11  History:        Patient has no prior history of Echocardiogram examinations. TIA                 and Stroke; Risk Factors:Hypertension, Dyslipidemia and GERD.  Sonographer:    Bernadene Person RDCS Referring Phys: [id number] [place]  1. Left ventricular ejection fraction, by estimation, is 60 to 65%. The left ventricle has normal function. The left ventricle has no regional wall motion abnormalities. Left ventricular diastolic parameters are consistent with Grade I diastolic dysfunction (impaired relaxation). The average left ventricular global longitudinal strain is -17.6 %. The global longitudinal strain is normal.  2. Right ventricular systolic function is normal. The right ventricular size is normal.   3. The mitral valve is normal in structure. No evidence of mitral valve regurgitation. No evidence of mitral stenosis.  4. The aortic valve is normal in structure. Aortic valve regurgitation is not visualized. No aortic stenosis is present.  5. The inferior vena cava is normal in size with greater than 50% respiratory variability, suggesting right atrial pressure of 3 mmHg. FINDINGS  Left Ventricle: Left ventricular ejection fraction, by estimation, is 60 to 65%. The left ventricle has normal function. The left ventricle has no regional wall motion abnormalities. The average left ventricular global longitudinal strain is -17.6 %. The global longitudinal strain is normal. The left ventricular internal cavity size was normal in size. There is no left ventricular hypertrophy. Left ventricular diastolic parameters are consistent with Grade I diastolic dysfunction (impaired relaxation). Right Ventricle: The right ventricular size is normal. No increase in right ventricular wall thickness. Right ventricular systolic function is normal. Left Atrium: Left atrial size was normal in size. Right Atrium: Right atrial size was normal in size. Pericardium: There is no evidence of pericardial effusion. Mitral Valve: The mitral valve is normal in structure. Normal mobility of the mitral valve leaflets. No evidence of mitral valve regurgitation. No evidence of mitral valve stenosis. Tricuspid Valve: The tricuspid valve is normal in structure. Tricuspid valve regurgitation is not demonstrated. No evidence of tricuspid stenosis. Aortic Valve: The aortic valve is normal in structure. Aortic valve regurgitation is not visualized. No aortic stenosis is present. Pulmonic Valve: The pulmonic valve was normal in structure. Pulmonic valve  regurgitation is trivial. No evidence of pulmonic stenosis. Aorta: The aortic root is normal in size and structure. Venous: The inferior vena cava is normal in size with greater than 50% respiratory  variability, suggesting right atrial pressure of 3 mmHg. IAS/Shunts: No atrial level shunt detected by color flow Doppler.  LEFT VENTRICLE PLAX 2D LVIDd:         5.10 cm  Diastology LVIDs:         3.20 cm  LV e' lateral:   9.94 cm/s LV PW:         0.70 cm  LV E/e' lateral: 4.6 LV IVS:        0.70 cm  LV e' medial:    6.85 cm/s LVOT diam:     2.10 cm  LV E/e' medial:  6.6 LV SV:         81 LV SV Index:   43       2D Longitudinal Strain LVOT Area:     3.46 cm 2D Strain GLS Avg:     -17.6 %  RIGHT VENTRICLE RV S prime:     9.79 cm/s TAPSE (M-mode): 1.6 cm LEFT ATRIUM             Index       RIGHT ATRIUM           Index LA diam:        3.70 cm 1.94 cm/m  RA Area:     14.40 cm LA Vol (A2C):   69.5 ml 36.35 ml/m RA Volume:   31.90 ml  16.68 ml/m LA Vol (A4C):   51.0 ml 26.67 ml/m LA Biplane Vol: 59.4 ml 31.07 ml/m  AORTIC VALVE LVOT Vmax:   143.00 cm/s LVOT Vmean:  98.900 cm/s LVOT VTI:    0.235 m  AORTA Ao Root diam: 3.20 cm Ao Asc diam:  3.40 cm MITRAL VALVE MV Area (PHT): 2.22 cm    SHUNTS MV Decel Time: 342 msec    Systemic VTI:  0.24 m MV E velocity: 45.30 cm/s  Systemic Diam: 2.10 cm MV A velocity: 72.50 cm/s MV E/A ratio:  0.62 Candee Furbish MD Electronically signed by Candee Furbish MD Signature Date/Time: 05/22/2020/12:55:40 PM    Final    IR IMAGING GUIDED PORT INSERTION  Result Date: 05/19/2020 INDICATION: History of diffuse large B-cell lymphoma. In need of durable intravenous access for chemotherapy administration EXAM: IMPLANTED PORT A CATH PLACEMENT WITH ULTRASOUND AND FLUOROSCOPIC GUIDANCE COMPARISON:  PET-CT-04/28/2020 MEDICATIONS: Ancef 2 gm IV; The antibiotic was administered within an appropriate time interval prior to skin puncture. ANESTHESIA/SEDATION: Moderate (conscious) sedation was employed during this procedure. A total of Versed 2 mg and Fentanyl 100 mcg was administered intravenously. Moderate Sedation Time: 25 minutes. The patient's level of consciousness and vital signs were monitored  continuously by radiology nursing throughout the procedure under my direct supervision. CONTRAST:  None FLUOROSCOPY TIME:  24 seconds (5 mGy) COMPLICATIONS: None immediate. PROCEDURE: The procedure, risks, benefits, and alternatives were explained to the patient. Questions regarding the procedure were encouraged and answered. The patient understands and consents to the procedure. The right neck and chest were prepped with chlorhexidine in a sterile fashion, and a sterile drape was applied covering the operative field. Maximum barrier sterile technique with sterile gowns and gloves were used for the procedure. A timeout was performed prior to the initiation of the procedure. Local anesthesia was provided with 1% lidocaine with epinephrine. After creating a small venotomy incision, a micropuncture kit  was utilized to access the internal jugular vein. Real-time ultrasound guidance was utilized for vascular access including the acquisition of a permanent ultrasound image documenting patency of the accessed vessel. The microwire was utilized to measure appropriate catheter length. A subcutaneous port pocket was then created along the upper chest wall utilizing a combination of sharp and blunt dissection. The pocket was irrigated with sterile saline. A single lumen "Slim" sized power injectable port was chosen for placement. The 8 Fr catheter was tunneled from the port pocket site to the venotomy incision. The port was placed in the pocket. The external catheter was trimmed to appropriate length. At the venotomy, an 8 Fr peel-away sheath was placed over a guidewire under fluoroscopic guidance. The catheter was then placed through the sheath and the sheath was removed. Final catheter positioning was confirmed and documented with a fluoroscopic spot radiograph. The port was accessed with a Huber needle, aspirated and flushed with heparinized saline. The venotomy site was closed with an interrupted 4-0 Vicryl suture. The  port pocket incision was closed with interrupted 2-0 Vicryl suture. The skin was opposed with a running subcuticular 4-0 Vicryl suture. Dermabond and Steri-strips were applied to both incisions. Dressings were applied. The patient tolerated the procedure well without immediate post procedural complication. FINDINGS: After catheter placement, the tip lies within the superior cavoatrial junction. The catheter aspirates and flushes normally and is ready for immediate use. IMPRESSION: Successful placement of a right internal jugular approach power injectable Port-A-Cath. The catheter is ready for immediate use. Electronically Signed   By: Sandi Mariscal M.D.   On: 05/19/2020 15:59    Assessment and Plan:  Connor Adams 74 y.o. male with medical history significant for Stage I DLBCL who presents for a follow up visit.  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  He is currently receiving cycle #2 of EPOCH-R starting on 06/16/2020.  Today he is Cycle 5 Day 4 of treatment.  He is tolerating his chemotherapy well so far with no nausea or vomiting.  Bowels are moving and he has a good appetite.  There are no barriers to proceeding with treatment.      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 Stage I bulky disease.   --Continue EPOCH-R. Today is Cycle 5 Day 4 --Adverse effects have been reviewed including, but not limited to, alopecia, myelosuppression, peripheral neuropathy, mucositis, liver/renal dysfunction, and possible infusion reaction associated with Rituxan. He agrees to proceed. --PAC in place and ready for use. --Echocardiogram  from 05/22/2020 shows LVEF of 60-65%  --CBC adequate to proceed with treatment today.  Leukocytosis likely related to steroids.  Hemoglobin is 7.5 this morning.  He is asymptomatic.  Discussed with patient that he may need PRBC transfusion this admission.  Plan to transfuse for hemoglobin less than 7. --Will monitor closely with CBC with diff, CMET, LDH, and uric acid --Lovenox for DVT prophylaxis. --Continue home medications for HTN and hyperlipidemia.  --Continue PRN antiemetics. --Continue MiraLAX and Senokot.  #Pulmonary nodules of unclear etiology --PET scan from 08/15/2020 showed new bilateral pulmonary nodules --Etiology unclear --Appreciate pulmonology consult.  They recommend repeat CT scan of the chest  in approximately 6 weeks to reevaluate.  The patient is scheduled for labs, follow-up visit, Rituxan, and Neulasta on Monday, 08/25/2020.  [name], DNP, AGPCNP-BC, AOCNP Mon/Tues/Thurs/Fri 7am-5pm; Off Wednesdays Cell: [phone]

## 2020-08-22 LAB — CBC WITH DIFFERENTIAL/PLATELET
Abs Immature Granulocytes: 0.09 10*3/uL — ABNORMAL HIGH (ref 0.00–0.07)
Basophils Absolute: 0 10*3/uL (ref 0.0–0.1)
Basophils Relative: 0 %
Eosinophils Absolute: 0 10*3/uL (ref 0.0–0.5)
Eosinophils Relative: 0 %
HCT: 24.9 % — ABNORMAL LOW (ref 39.0–52.0)
Hemoglobin: 7.9 g/dL — ABNORMAL LOW (ref 13.0–17.0)
Immature Granulocytes: 1 %
Lymphocytes Relative: 2 %
Lymphs Abs: 0.2 10*3/uL — ABNORMAL LOW (ref 0.7–4.0)
MCH: 29.4 pg (ref 26.0–34.0)
MCHC: 31.7 g/dL (ref 30.0–36.0)
MCV: 92.6 fL (ref 80.0–100.0)
Monocytes Absolute: 0.1 10*3/uL (ref 0.1–1.0)
Monocytes Relative: 1 %
Neutro Abs: 10.3 10*3/uL — ABNORMAL HIGH (ref 1.7–7.7)
Neutrophils Relative %: 96 %
Platelets: 307 10*3/uL (ref 150–400)
RBC: 2.69 MIL/uL — ABNORMAL LOW (ref 4.22–5.81)
RDW: 20 % — ABNORMAL HIGH (ref 11.5–15.5)
WBC: 10.6 10*3/uL — ABNORMAL HIGH (ref 4.0–10.5)
nRBC: 0 % (ref 0.0–0.2)

## 2020-08-22 LAB — COMPREHENSIVE METABOLIC PANEL
ALT: 19 U/L (ref 0–44)
AST: 17 U/L (ref 15–41)
Albumin: 3.1 g/dL — ABNORMAL LOW (ref 3.5–5.0)
Alkaline Phosphatase: 42 U/L (ref 38–126)
Anion gap: 8 (ref 5–15)
BUN: 30 mg/dL — ABNORMAL HIGH (ref 8–23)
CO2: 26 mmol/L (ref 22–32)
Calcium: 8.7 mg/dL — ABNORMAL LOW (ref 8.9–10.3)
Chloride: 109 mmol/L (ref 98–111)
Creatinine, Ser: 1 mg/dL (ref 0.61–1.24)
GFR, Estimated: >60 mL/min (ref >60)
Glucose, Bld: 130 mg/dL — ABNORMAL HIGH (ref 70–99)
Potassium: 3.7 mmol/L (ref 3.5–5.1)
Sodium: 143 mmol/L (ref 135–145)
Total Bilirubin: 0.5 mg/dL (ref 0.3–1.2)
Total Protein: 5.1 g/dL — ABNORMAL LOW (ref 6.5–8.1)

## 2020-08-22 LAB — URIC ACID: Uric Acid, Serum: 6.8 mg/dL (ref 3.7–8.6)

## 2020-08-22 LAB — LACTATE DEHYDROGENASE: LDH: 118 U/L (ref 98–192)

## 2020-08-22 LAB — PREPARE RBC (CROSSMATCH)

## 2020-08-22 MED ORDER — FLUTICASONE PROPIONATE 50 MCG/ACT NA SUSP
1.0000 | Freq: Every day | NASAL | 2 refills | Status: AC | PRN
Start: 2020-08-22 — End: ?

## 2020-08-22 MED ORDER — DIPHENHYDRAMINE HCL 25 MG PO CAPS
25.0000 mg | ORAL_CAPSULE | Freq: Once | ORAL | Status: AC
  Administered 2020-08-22: 25 mg via ORAL
  Filled 2020-08-22: qty 1

## 2020-08-22 MED ORDER — ACETAMINOPHEN 325 MG PO TABS
650.0000 mg | ORAL_TABLET | Freq: Once | ORAL | Status: AC
  Administered 2020-08-22: 650 mg via ORAL
  Filled 2020-08-22: qty 2

## 2020-08-22 MED ORDER — SODIUM CHLORIDE 0.9% IV SOLUTION: Freq: Once | INTRAVENOUS | Status: DC

## 2020-08-22 MED ORDER — HEPARIN SOD (PORK) LOCK FLUSH 100 UNIT/ML IV SOLN
500.0000 [IU] | INTRAVENOUS | Status: AC | PRN
  Administered 2020-08-22: 500 [IU]
  Filled 2020-08-22: qty 5

## 2020-08-22 NOTE — Discharge Summary (Signed)
Physician Discharge Summary  Patient ID: Connor Adams MRN: [id number] DOB/AGE: 1946/09/14 74 y.o.  Admit date: 08/18/2020 Discharge date: 08/22/2020  Discharge Diagnoses:  Active Problems:   Encounter for antineoplastic chemotherapy   Diffuse large b-cell lymphoma, intra-abdominal lymph nodes ([facility])   Diffuse large B-cell lymphoma of intra-abdominal lymph nodes ([place])  Discharged Condition: good  Discharge Labs:  None pending.   Significant Diagnostic Studies:   CBC Latest Ref Rng & Units 08/22/2020 08/21/2020 08/20/2020  WBC 4.0 - 10.5 K/uL 10.6(H) 12.9(H) 22.0(H)  Hemoglobin 13.0 - 17.0 g/dL 7.9(L) 7.5(L) 8.6(L)  Hematocrit 39 - 52 % 24.9(L) 24.2(L) 28.6(L)  Platelets 150 - 400 K/uL 307 315 429(H)   CMP Latest Ref Rng & Units 08/22/2020 08/21/2020 08/20/2020  Glucose 70 - 99 mg/dL 130(H) 112(H) 114(H)  BUN 8 - 23 mg/dL 30(H) 32(H) 28(H)  Creatinine 0.61 - 1.24 mg/dL 1.00 1.10 1.11  Sodium 135 - 145 mmol/L 143 141 144  Potassium 3.5 - 5.1 mmol/L 3.7 3.8 4.0  Chloride 98 - 111 mmol/L 109 109 110  CO2 22 - 32 mmol/L 26 25 24   Calcium 8.9 - 10.3 mg/dL 8.7(L) 8.7(L) 9.3  Total Protein 6.5 - 8.1 g/dL 5.1(L) 5.1(L) 6.2(L)  Total Bilirubin 0.3 - 1.2 mg/dL 0.5 0.4 0.5  Alkaline Phos 38 - 126 U/L 42 47 58  AST 15 - 41 U/L 17 15 17   ALT 0 - 44 U/L 19 18 21    Consults: Pulmonology  Procedures: 08/22/2020 - 1 unit PRBCs for hemoglobin 7.9.  Disposition:  Discharge disposition: 01-Home or Self Care     Day of Discharge Physical Exam:  General:  well-nourished in no acute distress.   Eyes:  no scleral icterus.   Respiratory: lungs were clear bilaterally without wheezing or crackles.   Cardiovascular:  Regular rate and rhythm, S1/S2, without murmur, rub or gallop. There was no pedal edema.   GI:  Positive bowel sounds.  Left upper quadrant abdominal mass nonpalpable.   Musculoskeletal:  no spinal tenderness of palpation of vertebral spine.   Skin exam was without echymosis,  petichae.   Neuro exam was nonfocal. Patient was alert and oriented.  Attention was good.  Language was appropriate.  Mood was normal without depression.  Speech was not pressured.  Thought content was not tangential.    Allergies as of 08/22/2020   No Known Allergies     Medication List    STOP taking these medications   predniSONE 20 MG tablet Commonly known as: Deltasone     TAKE these medications   acetaminophen 325 MG tablet Commonly known as: TYLENOL Take 2 tablets (650 mg total) by mouth every 6 (six) hours as needed for mild pain, moderate pain or fever.   Cozaar 25 MG tablet Generic drug: losartan Take 25 mg by mouth daily.   Crestor 5 MG tablet Generic drug: rosuvastatin Take 5 mg by mouth daily.   feeding supplement Liqd Take 237 mLs by mouth 2 (two) times daily between meals.   fluticasone 50 MCG/ACT nasal spray Commonly known as: FLONASE Place 1-2 sprays into both nostrils daily as needed for allergies or rhinitis.   hydrocortisone 2.5 % rectal cream Commonly known as: ANUSOL-HC Place 1 application rectally daily as needed. What changed: when to take this   magic mouthwash w/lidocaine Soln Take 5 mLs by mouth 4 (four) times daily. Lidocaine 80 mls; benadryl liquid 80 mls; maalox 80 mls What changed:   when to take this  reasons to take  this   multivitamins ther. w/minerals Tabs tablet Take 1 tablet by mouth daily.   ondansetron 8 MG disintegrating tablet Commonly known as: Zofran ODT Take 1 tablet (8 mg total) by mouth every 8 (eight) hours as needed for nausea or vomiting.   pantoprazole 40 MG tablet Commonly known as: PROTONIX Take 1 tablet (40 mg total) by mouth 2 (two) times daily.   polyethylene glycol 17 g packet Commonly known as: MIRALAX / GLYCOLAX Take 17 g by mouth daily as needed for moderate constipation.   polyvinyl alcohol 1.4 % ophthalmic solution Commonly known as: LIQUIFILM TEARS Place 1 drop into both eyes as needed for dry  eyes.   prochlorperazine 10 MG tablet Commonly known as: COMPAZINE Take 1 tablet (10 mg total) by mouth every 6 (six) hours as needed for nausea or vomiting.   promethazine 25 MG tablet Commonly known as: PHENERGAN Take 1 tablet (25 mg total) by mouth every 6 (six) hours as needed for nausea or vomiting.   senna-docusate 8.6-50 MG tablet Commonly known as: Senokot-S Take 2 tablets by mouth 2 (two) times daily as needed for mild constipation.   sodium bicarbonate/sodium chloride Soln 1 application by Mouth Rinse route as needed for dry mouth.   sucralfate 1 g tablet Commonly known as: Carafate Take 1 tablet (1 g total) by mouth 4 (four) times daily -  with meals and at bedtime.        Hospital Course: Mr. [name] is a 74 year old male with medical history significant for double hit DLBCL.  He presented to the hospital on 08/18/2020 for cycle 5-day 1 of EPOCH-R chemotherapy.  Chemotherapy started as planned on 08/18/2020.  Throughout the course of the patient's admission he has had no complications.  Bowels have been moving without any difficulty with scheduled MiraLAX and Senokot-S. He was able to tolerate food well without difficulty.  He did not have any issues with fevers, chills, sweats, nausea, vomiting or diarrhea.  He successfully completed all chemotherapy at full dose as prescribed. On the final day he had no concerning symptoms. However, his hemoglobin was 7.9 on the day of discharge. He was given 1 unit PRBCs prior to discharge.   He was seen by Pulmonology this admission for pulmonary nodules noted on outpatient PET scan. Etiology of these nodules is unclear. He is asymptomatic. They recommend repeat imaging in about 6 weeks to monitor these nodules.    He is currently scheduled for labs, visit, G-CSF, and rituximab at the cancer center on 08/25/2020.  He has our contact information and has been instructed to contact us in the event he were to develop any new concerning  symptoms or issues in the interim.  He was discharged on 08/22/2020 in excellent condition.   [name], DNP, AGPCNP-BC, AOCNP Mon/Tues/Thurs/Fri 7am-5pm; Off Wednesdays Cell: [phone]

## 2020-08-25 ENCOUNTER — Other Ambulatory Visit: Payer: Self-pay

## 2020-08-25 ENCOUNTER — Inpatient Hospital Stay: Payer: Medicare PPO | Admitting: Hematology and Oncology

## 2020-08-25 ENCOUNTER — Inpatient Hospital Stay: Payer: Medicare PPO | Attending: Hematology and Oncology

## 2020-08-25 ENCOUNTER — Inpatient Hospital Stay: Payer: Medicare PPO

## 2020-08-25 VITALS — BP 128/82 | HR 86 | Temp 97.2°F | Resp 15 | Ht 68.0 in | Wt 168.9 lb

## 2020-08-25 VITALS — BP 120/71 | HR 60 | Temp 97.8°F | Resp 20

## 2020-08-25 DIAGNOSIS — C8333 Diffuse large B-cell lymphoma, intra-abdominal lymph nodes: Secondary | ICD-10-CM

## 2020-08-25 DIAGNOSIS — Z5111 Encounter for antineoplastic chemotherapy: Secondary | ICD-10-CM | POA: Insufficient documentation

## 2020-08-25 DIAGNOSIS — R918 Other nonspecific abnormal finding of lung field: Secondary | ICD-10-CM | POA: Diagnosis not present

## 2020-08-25 DIAGNOSIS — Z95828 Presence of other vascular implants and grafts: Secondary | ICD-10-CM

## 2020-08-25 DIAGNOSIS — Z79899 Other long term (current) drug therapy: Secondary | ICD-10-CM | POA: Insufficient documentation

## 2020-08-25 DIAGNOSIS — C8338 Diffuse large B-cell lymphoma, lymph nodes of multiple sites: Secondary | ICD-10-CM | POA: Diagnosis not present

## 2020-08-25 LAB — CBC WITH DIFFERENTIAL (CANCER CENTER ONLY)
Abs Immature Granulocytes: 0.07 10*3/uL (ref 0.00–0.07)
Basophils Absolute: 0 10*3/uL (ref 0.0–0.1)
Basophils Relative: 0 %
Eosinophils Absolute: 0 10*3/uL (ref 0.0–0.5)
Eosinophils Relative: 1 %
HCT: 29.5 % — ABNORMAL LOW (ref 39.0–52.0)
Hemoglobin: 9.1 g/dL — ABNORMAL LOW (ref 13.0–17.0)
Immature Granulocytes: 1 %
Lymphocytes Relative: 1 %
Lymphs Abs: 0.1 10*3/uL — ABNORMAL LOW (ref 0.7–4.0)
MCH: 28.1 pg (ref 26.0–34.0)
MCHC: 30.8 g/dL (ref 30.0–36.0)
MCV: 91 fL (ref 80.0–100.0)
Monocytes Absolute: 0 10*3/uL — ABNORMAL LOW (ref 0.1–1.0)
Monocytes Relative: 0 %
Neutro Abs: 5.5 10*3/uL (ref 1.7–7.7)
Neutrophils Relative %: 97 %
Platelet Count: 278 10*3/uL (ref 150–400)
RBC: 3.24 MIL/uL — ABNORMAL LOW (ref 4.22–5.81)
RDW: 19.5 % — ABNORMAL HIGH (ref 11.5–15.5)
WBC Count: 5.7 10*3/uL (ref 4.0–10.5)
nRBC: 0 % (ref 0.0–0.2)

## 2020-08-25 LAB — CMP (CANCER CENTER ONLY)
ALT: 17 U/L (ref 0–44)
AST: 15 U/L (ref 15–41)
Albumin: 3.3 g/dL — ABNORMAL LOW (ref 3.5–5.0)
Alkaline Phosphatase: 52 U/L (ref 38–126)
Anion gap: 5 (ref 5–15)
BUN: 25 mg/dL — ABNORMAL HIGH (ref 8–23)
CO2: 31 mmol/L (ref 22–32)
Calcium: 8.9 mg/dL (ref 8.9–10.3)
Chloride: 106 mmol/L (ref 98–111)
Creatinine: 1 mg/dL (ref 0.61–1.24)
GFR, Estimated: >60 mL/min (ref >60)
Glucose, Bld: 128 mg/dL — ABNORMAL HIGH (ref 70–99)
Potassium: 3.9 mmol/L (ref 3.5–5.1)
Sodium: 142 mmol/L (ref 135–145)
Total Bilirubin: 0.7 mg/dL (ref 0.3–1.2)
Total Protein: 5.6 g/dL — ABNORMAL LOW (ref 6.5–8.1)

## 2020-08-25 LAB — TYPE AND SCREEN
ABO/RH(D): A POS
Antibody Screen: NEGATIVE
Unit division: 0

## 2020-08-25 LAB — BPAM RBC
Blood Product Expiration Date: 202112112359
ISSUE DATE / TIME: 202112031036
Unit Type and Rh: 6200

## 2020-08-25 LAB — SAMPLE TO BLOOD BANK

## 2020-08-25 LAB — LACTATE DEHYDROGENASE: LDH: 156 U/L (ref 98–192)

## 2020-08-25 LAB — URIC ACID: Uric Acid, Serum: 6.5 mg/dL (ref 3.7–8.6)

## 2020-08-25 MED ORDER — PEGFILGRASTIM-CBQV 6 MG/0.6ML ~~LOC~~ SOSY
6.0000 mg | PREFILLED_SYRINGE | Freq: Once | SUBCUTANEOUS | Status: AC
  Administered 2020-08-25: 6 mg via SUBCUTANEOUS

## 2020-08-25 MED ORDER — SODIUM CHLORIDE 0.9% FLUSH
10.0000 mL | Freq: Once | INTRAVENOUS | Status: AC
  Administered 2020-08-25: 10 mL via INTRAVENOUS
  Filled 2020-08-25: qty 10

## 2020-08-25 MED ORDER — DIPHENHYDRAMINE HCL 25 MG PO CAPS
25.0000 mg | ORAL_CAPSULE | Freq: Once | ORAL | Status: AC
  Administered 2020-08-25: 25 mg via ORAL

## 2020-08-25 MED ORDER — SODIUM CHLORIDE 0.9 % IV SOLN
INTRAVENOUS | Status: DC
  Filled 2020-08-25: qty 250

## 2020-08-25 MED ORDER — SODIUM CHLORIDE 0.9 % IV SOLN
375.0000 mg/m2 | Freq: Once | INTRAVENOUS | Status: AC
  Administered 2020-08-25: 700 mg via INTRAVENOUS
  Filled 2020-08-25: qty 20

## 2020-08-25 MED ORDER — ACETAMINOPHEN 325 MG PO TABS
ORAL_TABLET | ORAL | Status: AC
  Filled 2020-08-25: qty 2

## 2020-08-25 MED ORDER — PEGFILGRASTIM-CBQV 6 MG/0.6ML ~~LOC~~ SOSY
PREFILLED_SYRINGE | SUBCUTANEOUS | Status: AC
  Filled 2020-08-25: qty 0.6

## 2020-08-25 MED ORDER — DIPHENHYDRAMINE HCL 25 MG PO CAPS
ORAL_CAPSULE | ORAL | Status: AC
  Filled 2020-08-25: qty 1

## 2020-08-25 MED ORDER — ACETAMINOPHEN 325 MG PO TABS
650.0000 mg | ORAL_TABLET | Freq: Once | ORAL | Status: AC
  Administered 2020-08-25: 650 mg via ORAL

## 2020-08-25 NOTE — Patient Instructions (Signed)

## 2020-08-25 NOTE — Patient Instructions (Signed)
[facility] Discharge Instructions for Patients Receiving Chemotherapy  Today you received the following chemotherapy agents Rituximab-pvvr (RUXIENCE).  To help prevent nausea and vomiting after your treatment, we encourage you to take your nausea medication as prescribed.   If you develop nausea and vomiting that is not controlled by your nausea medication, call the clinic.   BELOW ARE SYMPTOMS THAT SHOULD BE REPORTED IMMEDIATELY:  *FEVER GREATER THAN 100.5 F  *CHILLS WITH OR WITHOUT FEVER  NAUSEA AND VOMITING THAT IS NOT CONTROLLED WITH YOUR NAUSEA MEDICATION  *UNUSUAL SHORTNESS OF BREATH  *UNUSUAL BRUISING OR BLEEDING  TENDERNESS IN MOUTH AND THROAT WITH OR WITHOUT PRESENCE OF ULCERS  *URINARY PROBLEMS  *BOWEL PROBLEMS  UNUSUAL RASH Items with * indicate a potential emergency and should be followed up as soon as possible.  Feel free to call the clinic should you have any questions or concerns. The clinic phone number is [phone].  Please show the CHEMO ALERT CARD at check-in to the Emergency Department and triage nurse.   

## 2020-08-25 NOTE — Progress Notes (Signed)
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   1) 04/17/2020:  CT A/P showed dominant nodal mass (11.5 x 10.9 cm) within the jejunal mesentery, highly suspicious for lymphoma. Additionally there are left lower lobe pleural-based pulmonary nodules 2) 04/24/2020: establish care with Dr. Lorenso Courier 3) 04/28/2020:  PET CT scan shows Large solid left mesenteric mass 13.0 cm with maximum SUV of 21.2, Deauville 5 with local hypermetabolic porta hepatis and retroperitoneal lymph nodes. Constitutes bulky Stage I disease.  4) 05/27/2020-05/31/2020: Cycle 1 of R-EPOCH 5) 06/16/2020-06/23/2020: Cycle 2 of R-EPOCH 6) 07/07/2020-07/11/2020: Cycle 3 of R-EPOCH 7) 07/28/2020-08/01/2020: Cycle 4 of R-EPOCH 8) 08/18/2020-08/22/2020: Cycle 5 of R-EPOCH  Interval History:  Connor Adams 74 y.o. male with medical history significant for Stage I DLBCL who presents for a follow up visit. The patient was last seen while in house from 08/18/2020-08/22/2020 for R-EPOCH. In the interim since then he received rituximab and GCSF therapy today.   On exam today Connor Adams is accompanied by his wife (on speaker phone).  He notes he has been well in the interim since his discharge from the hospital.  He reports that "my legs have been less shaky".  He reports he did a good bowel movement yesterday which was a little bit runny and therefore he is going to back down on his stool softeners.  He also was having some dripping of blood from his hemorrhoid.  He denies having any pain with that bowel movement.  Otherwise his energy has been good and has not yet developed any sores in his mouth.  He still has left over Magic mouthwash in the event that this were to occur.  On further discussion he notes that his appetite is  quite good and his taste is beginning to come back and is not having any problem with smells.  On Friday and Saturday he was able to work in the yard without difficulty.  He denies any fevers, chills, sweats, nausea, vomiting or frank diarrhea.  A full 10 point ROS is listed below.  MEDICAL HISTORY:  Past Medical History:  Diagnosis Date  . Arthritis   . Chronic kidney disease (CKD), stage III (moderate) (HCC)   . COVID-19   . GERD (gastroesophageal reflux disease)    occ  . Hemorrhoids   . History of ETT 3/05   low risk  . History of hiatal hernia   . History of kidney stones   . HLD (hyperlipidemia)   . HTN (hypertension)   . Left inguinal hernia   . Melanoma ([place])    excied  . 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 Stroke [facility]) 2011   tia   . TIA (transient ischemic attack) 4/11   associated w slurred speecha ndmild facial droop. lasted for about 10 minutes. Had MRI, etc with [facility] that per his report was ok (dr. Stephannie Li). Echo (4/11): EF 55-60%, no regional WMAs, normal diastolic function, mild LAE, no source of embolus    SURGICAL HISTORY: Past Surgical History:  Procedure Laterality Date  . BMI 30.2 muscular  12/08/04  . curretage actinic keratosis R ear  01/20/05  . ETT low prob of CAD  11/21/03  . excision melanoma in situ back  01/20/05  . hemorrhoids sclerosed at colonoscopy  12/19/05  .  IR IMAGING GUIDED PORT INSERTION  05/19/2020  . LAPAROSCOPIC INGUINAL HERNIA REPAIR Left 12/20/1995  . LUMBAR DISC SURGERY    . NERVE REPAIR     back of neck  . retinal laser surgery Left 09/20/1986  . SEPTOPLASTY  11/19/98  . TOTAL KNEE ARTHROPLASTY Left 12/13/2016   Procedure: TOTAL KNEE ARTHROPLASTY WITH RIGHT KNEE CORTISONE INJECTION;  Surgeon: Frederik Pear, MD;  Location: [facility];  Service: Orthopedics;  Laterality: Left;  . uric acid 6.9  12/22/04  . x-ray l great toe  07/21/00    SOCIAL HISTORY: Social History   Socioeconomic History  . Marital status: Married     Spouse name: [name]  . Number of children: 2  . Years of education: 100  . Highest education level: Not on file  Occupational History  . Occupation: Systems developer: [employer]  Tobacco Use  . Smoking status: Never Smoker  . Smokeless tobacco: Never Used  Vaping Use  . Vaping Use: Never used  Substance and Sexual Activity  . Alcohol use: No  . Drug use: No  . Sexual activity: Yes  Other Topics Concern  . Not on file  Social History Narrative   Married to [employer] at [employer].   Son, [name] married   Son, [name], Married.    Caffeine use: 1 cup coffee per day   Social Determinants of Health   Financial Resource Strain:   . Difficulty of Paying Living Expenses: Not on file  Food Insecurity:   . Worried About Charity fundraiser in the Last Year: Not on file  . Ran Out of Food in the Last Year: Not on file  Transportation Needs:   . Lack of Transportation (Medical): Not on file  . Lack of Transportation (Non-Medical): Not on file  Physical Activity:   . Days of Exercise per Week: Not on file  . Minutes of Exercise per Session: Not on file  Stress:   . Feeling of Stress : Not on file  Social Connections:   . Frequency of Communication with Friends and Family: Not on file  . Frequency of Social Gatherings with Friends and Family: Not on file  . Attends Religious Services: Not on file  . Active Member of Clubs or Organizations: Not on file  . Attends Archivist Meetings: Not on file  . Marital Status: Not on file  Intimate Partner Violence:   . Fear of Current or Ex-Partner: Not on file  . Emotionally Abused: Not on file  . Physically Abused: Not on file  . Sexually Abused: Not on file    FAMILY HISTORY: Family History  Problem Relation Age of Onset  . Liver cancer Sister   . Heart failure Brother 44       CABGx4  . Heart attack Mother 62  . Uterine cancer Mother   . Heart attack Father 62  . Asthma Son   .  Diabetes Brother   . Prostate cancer Brother     ALLERGIES:  has No Known Allergies.  MEDICATIONS:  Current Outpatient Medications  Medication Sig Dispense Refill  . acetaminophen (TYLENOL) 325 MG tablet Take 2 tablets (650 mg total) by mouth every 6 (six) hours as needed for mild pain, moderate pain or fever. (Patient not taking: Reported on 08/18/2020)    . feeding supplement, ENSURE ENLIVE, (ENSURE ENLIVE) LIQD Take 237 mLs by mouth 2 (two) times daily between meals. 237 mL 12  .  fluticasone (FLONASE) 50 MCG/ACT nasal spray Place 1-2 sprays into both nostrils daily as needed for allergies or rhinitis.  2  . hydrocortisone (ANUSOL-HC) 2.5 % rectal cream Place 1 application rectally daily as needed. (Patient taking differently: Place 1 application rectally daily. ) 30 g 0  . losartan (COZAAR) 25 MG tablet Take 25 mg by mouth daily.     . magic mouthwash w/lidocaine SOLN Take 5 mLs by mouth 4 (four) times daily. Lidocaine 80 mls; benadryl liquid 80 mls; maalox 80 mls (Patient not taking: Reported on 08/25/2020) 240 mL 1  . Multiple Vitamins-Minerals (MULTIVITAMINS THER. W/MINERALS) TABS Take 1 tablet by mouth daily.      . ondansetron (ZOFRAN ODT) 8 MG disintegrating tablet Take 1 tablet (8 mg total) by mouth every 8 (eight) hours as needed for nausea or vomiting. 30 tablet 5  . pantoprazole (PROTONIX) 40 MG tablet Take 1 tablet (40 mg total) by mouth 2 (two) times daily. 60 tablet 5  . polyethylene glycol (MIRALAX / GLYCOLAX) 17 g packet Take 17 g by mouth daily as needed for moderate constipation. 14 each 0  . polyvinyl alcohol (LIQUIFILM TEARS) 1.4 % ophthalmic solution Place 1 drop into both eyes as needed for dry eyes.    Marland Kitchen prochlorperazine (COMPAZINE) 10 MG tablet Take 1 tablet (10 mg total) by mouth every 6 (six) hours as needed for nausea or vomiting. 30 tablet 0  . promethazine (PHENERGAN) 25 MG tablet Take 1 tablet (25 mg total) by mouth every 6 (six) hours as needed for nausea or  vomiting. 45 tablet 2  . rosuvastatin (CRESTOR) 5 MG tablet Take 5 mg by mouth daily.     Marland Kitchen senna-docusate (SENOKOT-S) 8.6-50 MG tablet Take 2 tablets by mouth 2 (two) times daily as needed for mild constipation.    . Sodium Chloride-Sodium Bicarb (SODIUM BICARBONATE/SODIUM CHLORIDE) SOLN 1 application by Mouth Rinse route as needed for dry mouth.    . sucralfate (CARAFATE) 1 g tablet Take 1 tablet (1 g total) by mouth 4 (four) times daily -  with meals and at bedtime. 60 tablet 3   No current facility-administered medications for this visit.   Facility-Administered Medications Ordered in Other Visits  Medication Dose Route Frequency Provider Last Rate Last Admin  . 0.9 %  sodium chloride infusion   Intravenous Continuous Orson Slick, MD   Stopped at 08/25/20 1441    REVIEW OF SYSTEMS:   Constitutional: ( - ) fevers, ( - )  chills , ( - ) night sweats Eyes: ( - ) blurriness of vision, ( - ) double vision, ( - ) watery eyes Ears, nose, mouth, throat, and face: ( - ) mucositis, ( - ) sore throat Respiratory: ( - ) cough, ( - ) dyspnea, ( - ) wheezes Cardiovascular: ( - ) palpitation, ( - ) chest discomfort, ( - ) lower extremity swelling Gastrointestinal:  ( - ) nausea, ( - ) heartburn, ( - ) change in bowel habits Skin: ( - ) abnormal skin rashes Lymphatics: ( - ) new lymphadenopathy, ( - ) easy bruising Neurological: ( - ) numbness, ( - ) tingling, ( - ) new weaknesses Behavioral/Psych: ( - ) mood change, ( - ) new changes  All other systems were reviewed with the patient and are negative.  PHYSICAL EXAMINATION: ECOG PERFORMANCE STATUS: 1 - Symptomatic but completely ambulatory  Vitals:   08/25/20 1141  BP: 128/82  Pulse: 86  Resp: 15  Temp: (!) 97.2  F (36.2 C)  SpO2: 100%   Filed Weights   08/25/20 1141  Weight: 168 lb 14.4 oz (76.6 kg)    GENERAL: well appearing elderly Caucasian male. alert, no distress and comfortable SKIN: skin color, texture, turgor are  normal, no rashes or significant lesions. Now bald, prior erythematous skin lesions clearing up.  EYES: conjunctiva are pink and non-injected, sclera clear LUNGS: clear to auscultation and percussion with normal breathing effort HEART: regular rate & rhythm and no murmurs and no lower extremity edema Musculoskeletal: no cyanosis of digits and no clubbing  PSYCH: alert & oriented x 3, fluent speech NEURO: no focal motor/sensory deficits  LABORATORY DATA:  I have reviewed the data as listed CBC Latest Ref Rng & Units 08/25/2020 08/22/2020 08/21/2020  WBC 4.0 - 10.5 K/uL 5.7 10.6(H) 12.9(H)  Hemoglobin 13.0 - 17.0 g/dL 9.1(L) 7.9(L) 7.5(L)  Hematocrit 39 - 52 % 29.5(L) 24.9(L) 24.2(L)  Platelets 150 - 400 K/uL 278 307 315    CMP Latest Ref Rng & Units 08/25/2020 08/22/2020 08/21/2020  Glucose 70 - 99 mg/dL 128(H) 130(H) 112(H)  BUN 8 - 23 mg/dL 25(H) 30(H) 32(H)  Creatinine 0.61 - 1.24 mg/dL 1.00 1.00 1.10  Sodium 135 - 145 mmol/L 142 143 141  Potassium 3.5 - 5.1 mmol/L 3.9 3.7 3.8  Chloride 98 - 111 mmol/L 106 109 109  CO2 22 - 32 mmol/L 31 26 25   Calcium 8.9 - 10.3 mg/dL 8.9 8.7(L) 8.7(L)  Total Protein 6.5 - 8.1 g/dL 5.6(L) 5.1(L) 5.1(L)  Total Bilirubin 0.3 - 1.2 mg/dL 0.7 0.5 0.4  Alkaline Phos 38 - 126 U/L 52 42 47  AST 15 - 41 U/L 15 17 15   ALT 0 - 44 U/L 17 19 18     RADIOGRAPHIC STUDIES: I have personally reviewed the radiological images as listed and agreed with the findings in the report: large abdominal mass markedly decreased in FDG avidity. New diffuse pulmonary nodules.   NM PET Image Restag (PS) Skull Base To Thigh  Result Date: 08/16/2020 CLINICAL DATA:  Subsequent treatment strategy for diffuse large B-cell lymphoma. EXAM: NUCLEAR MEDICINE PET SKULL BASE TO THIGH TECHNIQUE: 7.97 mCi F-18 FDG was injected intravenously. Full-ring PET imaging was performed from the skull base to thigh after the radiotracer. CT data was obtained and used for attenuation correction and  anatomic localization. Fasting blood glucose: 83 mg/dl COMPARISON:  04/29/2019 FINDINGS: Mediastinal blood pool activity: SUV max 2.18 Liver activity: SUV max 2.84 NECK: No hypermetabolic lymph nodes in the neck. Incidental CT findings: none CHEST: No FDG avid axillary, supraclavicular, mediastinal, or hilar lymph nodes. Interval development of multifocal bilateral FDG avid pulmonary nodules. -index nodule in the left upper lobe measures 0.8 cm and has an SUV max of 4.86, image 19/8. -Anteromedial left upper lobe nodule measures 0.9 cm within SUV max of 4.83, image 15/8. Index nodule in the right middle lobe measures 1 cm and has an SUV max of 7.08. -index nodule within the lateral left lung base measures 1.4 cm within SUV max of 3.93 -increased pleural nodularity overlying the posterior left lower lobe with corresponding increased uptake within SUV max of 7.31, image 89/4. Incidental CT findings: Aortic atherosclerosis. Coronary artery atherosclerotic calcifications. New trace pericardial effusion, image 103/4. ABDOMEN/PELVIS: No abnormal uptake within the liver, pancreas, or adrenal glands. The spleen is normal in size. Mild increased uptake within the spleen has an SUV max of 2.98. Normal adrenal glands. Mesenteric mass is again noted. This has decreased in size in  the interval measuring 7.1 x 3.8 cm with SUV max of 2.52. Small soft tissue nodule within the peritoneum just below the level of the mesenteric mass measures 1.7 cm and has an SUV max of 3.84. Incidental CT findings: Aortic atherosclerosis. No aneurysm. Bilateral nephrolithiasis. Calcified granulomas noted within the spleen. SKELETON: No focal hypermetabolic activity to suggest skeletal metastasis. There is diffuse bone marrow uptake noted throughout the axial and appendicular skeleton compatible with treatment related changes. Incidental CT findings: 2 new left posterior eighth and ninth rib fractures identified. IMPRESSION: 1. Interval decrease in  size and degree of FDG uptake associated with left mesenteric mass. SUV max is equal to 2.52, Deauville criteria 3. 2. New small peritoneal nodule within the left hemiabdomen has an SUV max of 3.8, Deauville criteria 4. 3. Multifocal FDG avid bilateral pulmonary nodules are new from previous exam. SUV max within these nodules ranges between 3.93 and 7.08. Deauville criteria 5. 4. Interval increase in size and degree of FDG uptake associated with pleural nodularity overlying the posterior left lower lobe with SUV max of 7.3. Deauville criteria 5. 5. Diffuse increased tracer uptake throughout the bone marrow consistent with treatment related changes. 6. Interval fracture of the left posterior eighth and ninth ribs 7. Aortic Atherosclerosis (ICD10-I70.0). Coronary artery calcifications. Electronically Signed   By: Kerby Moors M.D.   On: 08/16/2020 15:26    ASSESSMENT & PLAN Connor Adams 74 y.o. male with medical history significant for Stage I DLBCL who presents for a follow up visit.  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  At this time I think he continues to be an excellent candidate for continued chemotherapy treatment.   On exam today Mr. Ratz is doing quite well.  His hemoglobin is 9.1 and he is not having any focal symptoms at this time.  His weight is stable and otherwise he is quite healthy.   The current plan is for 6 cycles of R-EPOCH chemotherapy to be administered in the inpatient setting. He is currently s/p Cycle 5 with a tentative start date of Cycle 6 on 09/08/2020.   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  --findings consistent with a double hit DLBCL Treatment of  choice is R-EPOCH chemotherapy in the inpatient setting. --he is currently s/p Cycle 5 administered on 08/18/2020 --patient has a port in place. TTE was performed and showed strong baseline heart function. --plan for Cycle 6 of R-EPOCH to start on 09/08/2020. Direct admission to the hospital to be planned for that time.   #Lung Nodules -- noted on interim PET CT scan after Cycle 4.  --unclear if these represent areas of infection, metastatic disease, or progression of lymphoma --pulmonology consulted, appreciate their input.  --will monitor respiratory status closely --repeat PET CT scan after Cycle 6 (Jan 2022)   #Oral Ulcers, resolved -- oral lesion on roof of mouth and buccal membranes, resolved by start of Cycle 5.  --patient still has magic mouthwash  #Symptom Management --discontinued allopurinol 300mg  PO daily  --prescribed ondansetron 8mg  PO q8H PRN and compazine 10mg  PO q6H prn for nausea --continue Protonix in the setting of high dose steroids --encourage daily BM, prescribed senna docusate and miralax.  No  orders of the defined types were placed in this encounter.  All questions were answered. The patient knows to call the clinic with any problems, questions or concerns.  A total of more than 30 minutes were spent on this encounter and over half of that time was spent on counseling and coordination of care as outlined above.   [name], MD Department of Hematology/Oncology [place] at [facility] Phone: [phone] Pager: [phone] Email: [email or web address]  08/25/2020 4:48 PM

## 2020-09-01 MED FILL — MAGIC MOUTHWASH D/M/L: 8 days supply | Qty: 240 | Fill #1

## 2020-09-06 ENCOUNTER — Other Ambulatory Visit (HOSPITAL_COMMUNITY)
Admission: RE | Admit: 2020-09-06 | Discharge: 2020-09-06 | Disposition: A | Discharge status: Home / Self Care | Payer: Medicare PPO | Source: Ambulatory Visit | Attending: Hematology and Oncology | Admitting: Hematology and Oncology

## 2020-09-06 DIAGNOSIS — I129 Hypertensive chronic kidney disease with stage 1 through stage 4 chronic kidney disease, or unspecified chronic kidney disease: Secondary | ICD-10-CM | POA: Diagnosis present

## 2020-09-06 DIAGNOSIS — Z20822 Contact with and (suspected) exposure to covid-19: Secondary | ICD-10-CM | POA: Diagnosis present

## 2020-09-06 DIAGNOSIS — Z79899 Other long term (current) drug therapy: Secondary | ICD-10-CM | POA: Diagnosis not present

## 2020-09-06 DIAGNOSIS — D72828 Other elevated white blood cell count: Secondary | ICD-10-CM | POA: Diagnosis present

## 2020-09-06 DIAGNOSIS — T380X5A Adverse effect of glucocorticoids and synthetic analogues, initial encounter: Secondary | ICD-10-CM | POA: Diagnosis present

## 2020-09-06 DIAGNOSIS — Z96652 Presence of left artificial knee joint: Secondary | ICD-10-CM | POA: Diagnosis present

## 2020-09-06 DIAGNOSIS — I10 Essential (primary) hypertension: Secondary | ICD-10-CM | POA: Diagnosis not present

## 2020-09-06 DIAGNOSIS — Z8616 Personal history of COVID-19: Secondary | ICD-10-CM | POA: Diagnosis not present

## 2020-09-06 DIAGNOSIS — Z5111 Encounter for antineoplastic chemotherapy: Secondary | ICD-10-CM | POA: Diagnosis present

## 2020-09-06 DIAGNOSIS — Z8673 Personal history of transient ischemic attack (TIA), and cerebral infarction without residual deficits: Secondary | ICD-10-CM | POA: Diagnosis not present

## 2020-09-06 DIAGNOSIS — D72829 Elevated white blood cell count, unspecified: Secondary | ICD-10-CM | POA: Diagnosis not present

## 2020-09-06 DIAGNOSIS — C8333 Diffuse large B-cell lymphoma, intra-abdominal lymph nodes: Secondary | ICD-10-CM | POA: Diagnosis present

## 2020-09-06 DIAGNOSIS — Z01812 Encounter for preprocedural laboratory examination: Secondary | ICD-10-CM | POA: Insufficient documentation

## 2020-09-06 DIAGNOSIS — K219 Gastro-esophageal reflux disease without esophagitis: Secondary | ICD-10-CM | POA: Diagnosis present

## 2020-09-06 DIAGNOSIS — E785 Hyperlipidemia, unspecified: Secondary | ICD-10-CM | POA: Diagnosis present

## 2020-09-06 DIAGNOSIS — N183 Chronic kidney disease, stage 3 unspecified: Secondary | ICD-10-CM | POA: Diagnosis present

## 2020-09-06 DIAGNOSIS — D6489 Other specified anemias: Secondary | ICD-10-CM | POA: Diagnosis present

## 2020-09-06 DIAGNOSIS — Z86006 Personal history of melanoma in-situ: Secondary | ICD-10-CM | POA: Diagnosis not present

## 2020-09-06 DIAGNOSIS — R918 Other nonspecific abnormal finding of lung field: Secondary | ICD-10-CM | POA: Diagnosis present

## 2020-09-06 DIAGNOSIS — C8338 Diffuse large B-cell lymphoma, lymph nodes of multiple sites: Secondary | ICD-10-CM | POA: Diagnosis not present

## 2020-09-06 LAB — SARS CORONAVIRUS 2 (TAT 6-24 HRS): SARS Coronavirus 2: NEGATIVE

## 2020-09-08 ENCOUNTER — Inpatient Hospital Stay (HOSPITAL_COMMUNITY)
Admission: AD | Admit: 2020-09-08 | Discharge: 2020-09-12 | DRG: 847 | Disposition: A | Discharge status: Home / Self Care | Payer: Medicare PPO | Attending: Hematology and Oncology | Admitting: Hematology and Oncology

## 2020-09-08 ENCOUNTER — Other Ambulatory Visit: Payer: Self-pay | Admitting: Hematology and Oncology

## 2020-09-08 ENCOUNTER — Encounter (HOSPITAL_COMMUNITY): Payer: Self-pay | Admitting: Hematology and Oncology

## 2020-09-08 DIAGNOSIS — C8333 Diffuse large B-cell lymphoma, intra-abdominal lymph nodes: Secondary | ICD-10-CM | POA: Diagnosis present

## 2020-09-08 DIAGNOSIS — T380X5A Adverse effect of glucocorticoids and synthetic analogues, initial encounter: Secondary | ICD-10-CM | POA: Diagnosis present

## 2020-09-08 DIAGNOSIS — Z96652 Presence of left artificial knee joint: Secondary | ICD-10-CM | POA: Diagnosis present

## 2020-09-08 DIAGNOSIS — Z5111 Encounter for antineoplastic chemotherapy: Secondary | ICD-10-CM | POA: Diagnosis present

## 2020-09-08 DIAGNOSIS — C8338 Diffuse large B-cell lymphoma, lymph nodes of multiple sites: Secondary | ICD-10-CM | POA: Diagnosis not present

## 2020-09-08 DIAGNOSIS — Z20822 Contact with and (suspected) exposure to covid-19: Secondary | ICD-10-CM | POA: Diagnosis present

## 2020-09-08 DIAGNOSIS — K219 Gastro-esophageal reflux disease without esophagitis: Secondary | ICD-10-CM | POA: Diagnosis present

## 2020-09-08 DIAGNOSIS — N183 Chronic kidney disease, stage 3 unspecified: Secondary | ICD-10-CM | POA: Diagnosis present

## 2020-09-08 DIAGNOSIS — Z8673 Personal history of transient ischemic attack (TIA), and cerebral infarction without residual deficits: Secondary | ICD-10-CM | POA: Diagnosis not present

## 2020-09-08 DIAGNOSIS — R918 Other nonspecific abnormal finding of lung field: Secondary | ICD-10-CM | POA: Diagnosis present

## 2020-09-08 DIAGNOSIS — Z79899 Other long term (current) drug therapy: Secondary | ICD-10-CM

## 2020-09-08 DIAGNOSIS — D72829 Elevated white blood cell count, unspecified: Secondary | ICD-10-CM | POA: Diagnosis not present

## 2020-09-08 DIAGNOSIS — D6489 Other specified anemias: Secondary | ICD-10-CM | POA: Diagnosis present

## 2020-09-08 DIAGNOSIS — D72828 Other elevated white blood cell count: Secondary | ICD-10-CM | POA: Diagnosis present

## 2020-09-08 DIAGNOSIS — Z86006 Personal history of melanoma in-situ: Secondary | ICD-10-CM

## 2020-09-08 DIAGNOSIS — Z8616 Personal history of COVID-19: Secondary | ICD-10-CM

## 2020-09-08 DIAGNOSIS — I129 Hypertensive chronic kidney disease with stage 1 through stage 4 chronic kidney disease, or unspecified chronic kidney disease: Secondary | ICD-10-CM | POA: Diagnosis present

## 2020-09-08 DIAGNOSIS — E785 Hyperlipidemia, unspecified: Secondary | ICD-10-CM | POA: Diagnosis present

## 2020-09-08 DIAGNOSIS — I10 Essential (primary) hypertension: Secondary | ICD-10-CM | POA: Diagnosis not present

## 2020-09-08 LAB — CBC WITH DIFFERENTIAL/PLATELET
Abs Immature Granulocytes: 0.15 10*3/uL — ABNORMAL HIGH (ref 0.00–0.07)
Basophils Absolute: 0.1 10*3/uL (ref 0.0–0.1)
Basophils Relative: 1 %
Eosinophils Absolute: 0 10*3/uL (ref 0.0–0.5)
Eosinophils Relative: 0 %
HCT: 30 % — ABNORMAL LOW (ref 39.0–52.0)
Hemoglobin: 9.1 g/dL — ABNORMAL LOW (ref 13.0–17.0)
Immature Granulocytes: 1 %
Lymphocytes Relative: 3 %
Lymphs Abs: 0.3 10*3/uL — ABNORMAL LOW (ref 0.7–4.0)
MCH: 28.6 pg (ref 26.0–34.0)
MCHC: 30.3 g/dL (ref 30.0–36.0)
MCV: 94.3 fL (ref 80.0–100.0)
Monocytes Absolute: 0.2 10*3/uL (ref 0.1–1.0)
Monocytes Relative: 2 %
Neutro Abs: 9.9 10*3/uL — ABNORMAL HIGH (ref 1.7–7.7)
Neutrophils Relative %: 93 %
Platelets: 317 10*3/uL (ref 150–400)
RBC: 3.18 MIL/uL — ABNORMAL LOW (ref 4.22–5.81)
RDW: 20 % — ABNORMAL HIGH (ref 11.5–15.5)
WBC: 10.7 10*3/uL — ABNORMAL HIGH (ref 4.0–10.5)
nRBC: 0 % (ref 0.0–0.2)

## 2020-09-08 LAB — COMPREHENSIVE METABOLIC PANEL
ALT: 27 U/L (ref 0–44)
AST: 29 U/L (ref 15–41)
Albumin: 3.7 g/dL (ref 3.5–5.0)
Alkaline Phosphatase: 73 U/L (ref 38–126)
Anion gap: 11 (ref 5–15)
BUN: 22 mg/dL (ref 8–23)
CO2: 24 mmol/L (ref 22–32)
Calcium: 9.2 mg/dL (ref 8.9–10.3)
Chloride: 105 mmol/L (ref 98–111)
Creatinine, Ser: 1.16 mg/dL (ref 0.61–1.24)
GFR, Estimated: >60 mL/min (ref >60)
Glucose, Bld: 151 mg/dL — ABNORMAL HIGH (ref 70–99)
Potassium: 4.1 mmol/L (ref 3.5–5.1)
Sodium: 140 mmol/L (ref 135–145)
Total Bilirubin: 0.4 mg/dL (ref 0.3–1.2)
Total Protein: 6.4 g/dL — ABNORMAL LOW (ref 6.5–8.1)

## 2020-09-08 LAB — URIC ACID: Uric Acid, Serum: 6.6 mg/dL (ref 3.7–8.6)

## 2020-09-08 LAB — LACTATE DEHYDROGENASE: LDH: 199 U/L — ABNORMAL HIGH (ref 98–192)

## 2020-09-08 MED ORDER — PREDNISONE 50 MG PO TABS
120.0000 mg | ORAL_TABLET | Freq: Every day | ORAL | Status: DC
  Administered 2020-09-09 – 2020-09-12 (×4): 120 mg via ORAL
  Filled 2020-09-08 (×4): qty 1

## 2020-09-08 MED ORDER — HYDROCORTISONE 2.5 % RE CREA
1.0000 "application " | TOPICAL_CREAM | Freq: Every day | RECTAL | Status: DC
  Administered 2020-09-08 – 2020-09-12 (×5): 1 via RECTAL
  Filled 2020-09-08: qty 28.35

## 2020-09-08 MED ORDER — SUCRALFATE 1 G PO TABS
1.0000 g | ORAL_TABLET | Freq: Three times a day (TID) | ORAL | Status: DC
  Administered 2020-09-08 – 2020-09-12 (×16): 1 g via ORAL
  Filled 2020-09-08 (×16): qty 1

## 2020-09-08 MED ORDER — MAGIC MOUTHWASH W/LIDOCAINE
5.0000 mL | Freq: Four times a day (QID) | ORAL | Status: DC | PRN
  Filled 2020-09-08: qty 5

## 2020-09-08 MED ORDER — VINCRISTINE SULFATE CHEMO INJECTION 1 MG/ML
Freq: Once | INTRAVENOUS | Status: AC
  Filled 2020-09-08: qty 14

## 2020-09-08 MED ORDER — SODIUM CHLORIDE 0.9% FLUSH
10.0000 mL | Freq: Two times a day (BID) | INTRAVENOUS | Status: DC
  Administered 2020-09-08: 20 mL
  Administered 2020-09-08: 10 mL
  Administered 2020-09-09 – 2020-09-10 (×2): 20 mL
  Administered 2020-09-11 – 2020-09-12 (×2): 10 mL

## 2020-09-08 MED ORDER — VINCRISTINE SULFATE CHEMO INJECTION 1 MG/ML
Freq: Once | INTRAVENOUS | Status: AC
  Filled 2020-09-08 (×2): qty 14

## 2020-09-08 MED ORDER — FLUTICASONE PROPIONATE 50 MCG/ACT NA SUSP: 1.0000 | Freq: Every day | NASAL | Status: DC | PRN

## 2020-09-08 MED ORDER — SODIUM CHLORIDE 0.9 % IV SOLN
Freq: Once | INTRAVENOUS | Status: AC
  Administered 2020-09-08: 8 mg via INTRAVENOUS
  Filled 2020-09-08: qty 4

## 2020-09-08 MED ORDER — SODIUM BICARBONATE/SODIUM CHLORIDE MOUTHWASH
1.0000 "application " | Freq: Four times a day (QID) | OROMUCOSAL | Status: DC
  Administered 2020-09-08 – 2020-09-12 (×13): 1 via OROMUCOSAL
  Filled 2020-09-08 (×2): qty 1000

## 2020-09-08 MED ORDER — ADULT MULTIVITAMIN W/MINERALS CH
1.0000 | ORAL_TABLET | Freq: Every day | ORAL | Status: DC
  Administered 2020-09-09 – 2020-09-12 (×4): 1 via ORAL
  Filled 2020-09-08 (×5): qty 1

## 2020-09-08 MED ORDER — ACETAMINOPHEN 325 MG PO TABS: 650.0000 mg | ORAL_TABLET | Freq: Four times a day (QID) | ORAL | Status: DC | PRN

## 2020-09-08 MED ORDER — ONDANSETRON 4 MG PO TBDP: 8.0000 mg | ORAL_TABLET | Freq: Three times a day (TID) | ORAL | Status: DC | PRN

## 2020-09-08 MED ORDER — POLYETHYLENE GLYCOL 3350 17 G PO PACK
17.0000 g | PACK | Freq: Every day | ORAL | Status: DC | PRN
  Administered 2020-09-08: 17 g via ORAL
  Filled 2020-09-08: qty 1

## 2020-09-08 MED ORDER — ENOXAPARIN SODIUM 40 MG/0.4ML ~~LOC~~ SOLN: 40.0000 mg | SUBCUTANEOUS | Status: DC

## 2020-09-08 MED ORDER — LOSARTAN POTASSIUM 50 MG PO TABS
25.0000 mg | ORAL_TABLET | Freq: Every day | ORAL | Status: DC
  Administered 2020-09-09 – 2020-09-12 (×4): 25 mg via ORAL
  Filled 2020-09-08 (×4): qty 1

## 2020-09-08 MED ORDER — PROCHLORPERAZINE MALEATE 10 MG PO TABS: 10.0000 mg | ORAL_TABLET | Freq: Four times a day (QID) | ORAL | Status: DC | PRN

## 2020-09-08 MED ORDER — POLYVINYL ALCOHOL 1.4 % OP SOLN: 1.0000 [drp] | OPHTHALMIC | Status: DC | PRN

## 2020-09-08 MED ORDER — ROSUVASTATIN CALCIUM 5 MG PO TABS
5.0000 mg | ORAL_TABLET | Freq: Every day | ORAL | Status: DC
  Administered 2020-09-08 – 2020-09-12 (×5): 5 mg via ORAL
  Filled 2020-09-08 (×5): qty 1

## 2020-09-08 MED ORDER — ENOXAPARIN SODIUM 40 MG/0.4ML ~~LOC~~ SOLN
40.0000 mg | Freq: Every day | SUBCUTANEOUS | Status: DC
  Administered 2020-09-08 – 2020-09-12 (×5): 40 mg via SUBCUTANEOUS
  Filled 2020-09-08 (×5): qty 0.4

## 2020-09-08 MED ORDER — ENSURE ENLIVE PO LIQD
237.0000 mL | Freq: Two times a day (BID) | ORAL | Status: DC
  Administered 2020-09-08 (×2): 237 mL via ORAL

## 2020-09-08 MED ORDER — SENNOSIDES-DOCUSATE SODIUM 8.6-50 MG PO TABS
2.0000 | ORAL_TABLET | Freq: Two times a day (BID) | ORAL | Status: DC | PRN
  Administered 2020-09-08: 2 via ORAL
  Filled 2020-09-08: qty 2

## 2020-09-08 MED ORDER — HYDROCORTISONE 2.5 % RE CREA: 1.0000 "application " | TOPICAL_CREAM | Freq: Every day | RECTAL | Status: DC | PRN

## 2020-09-08 MED ORDER — SODIUM CHLORIDE 0.9% FLUSH: 10.0000 mL | INTRAVENOUS | Status: DC | PRN

## 2020-09-08 MED ORDER — CHLORHEXIDINE GLUCONATE CLOTH 2 % EX PADS
6.0000 | MEDICATED_PAD | Freq: Every day | CUTANEOUS | Status: DC
  Administered 2020-09-08 – 2020-09-12 (×5): 6 via TOPICAL

## 2020-09-08 MED ORDER — PANTOPRAZOLE SODIUM 40 MG PO TBEC
40.0000 mg | DELAYED_RELEASE_TABLET | Freq: Two times a day (BID) | ORAL | Status: DC
  Administered 2020-09-08 – 2020-09-12 (×8): 40 mg via ORAL
  Filled 2020-09-08 (×8): qty 1

## 2020-09-08 NOTE — H&P (Signed)
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   1) 04/17/2020:  CT A/P showed dominant nodal mass (11.5 x 10.9 cm) within the jejunal mesentery, highly suspicious for lymphoma. Additionally there are left lower lobe pleural-based pulmonary nodules 2) 04/24/2020: establish care with Dr. Lorenso Adams 3) 04/28/2020:  PET CT scan shows Large solid left mesenteric mass 13.0 cm with maximum SUV of 21.2, Deauville 5 with local hypermetabolic porta hepatis and retroperitoneal lymph nodes. Constitutes bulky Stage I disease.   Reason for Admission: double hit lymphoma, Cycle #6 EPOCH-[facility]  HPI: Connor Adams 74 y.o. 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   On review of the previous records Connor Adams underwent a CT abdomen pelvis on 04/17/2020 at which time a nodal mass 11.5 x 10.9 cm was noted within the jejunal mesentery.  There were also left lower lobe pleural-based pulmonary nodules noted at that time as well as soft tissue fullness at the ileocecal valve.  Due to concern for this finding the patient was referred to oncology for further evaluation and management.  A PET scan was performed on 04/28/2020 showed a large solid left mesenteric mass 13.0 cm with maximum SUV of 21.2, Deauville 5, hypermetabolic porta hepatis and retroperitoneal lymph nodes along with small but hypermetabolic juxtadiaphragmatic/pericardial lymph nodes in the lower chest, generally Deauville 4 and Deauville 5. A biopsy of the mesenteric mass was performed on 05/08/2020 which was consistent with Large B-cell lymphoma. 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 It was recommend for him to begin chemotherapy with EPOCH-[facility].   In anticipation of chemotherapy, the patient had a PAC placed on 05/19/2020 and an echocardiogram completed on 05/22/2020 which showed an LVEF of 60-65%.  The patient was seen for admission today.  He continues to feel well overall.  He experienced more fatigue following the last cycle of chemotherapy.  He also had mucositis which has improved. The patient denies recent fevers and chills.  Denies chest pain, shortness of breath, abdominal pain, nausea, vomiting.  Bowels are moving without any difficulty. Denies headaches and dizziness.  Denies bleeding.  The patient is seen today for admission to the [facility] for cycle #6 [facility].    Diagnosis Date  . Arthritis   . Chronic kidney disease (CKD), stage III (moderate) (HCC)   . COVID-19   . GERD (gastroesophageal reflux disease)    occ  . Hemorrhoids   . History of ETT 3/05   low risk  . History of hiatal hernia   . History of kidney stones   . HLD (hyperlipidemia)   . HTN (hypertension)   . Left inguinal hernia   . Melanoma ([place])    excied  . 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 Stroke Connor [facility]) 2011   tia   . TIA (transient ischemic attack) 4/11   associated w slurred speecha ndmild facial droop. lasted for about 10 minutes. Had MRI, etc with [facility] that per his report was ok (Connor [facility]). Echo (4/11): EF 55-60%, no regional WMAs, normal diastolic function, mild LAE, no source of embolus  Past Surgical History:  Procedure Laterality Date  . BMI 30.2 muscular  12/08/04  . curretage actinic keratosis Adams ear  01/20/05  . ETT low prob of CAD  11/21/03  . excision melanoma in situ back  01/20/05  . hemorrhoids sclerosed at colonoscopy  12/19/05  . IR IMAGING GUIDED PORT INSERTION  05/19/2020  . LAPAROSCOPIC INGUINAL HERNIA REPAIR Left 12/20/1995  . LUMBAR DISC SURGERY    . NERVE REPAIR     back of neck  . retinal laser  surgery Left 09/20/1986  . SEPTOPLASTY  11/19/98  . TOTAL KNEE ARTHROPLASTY Left 12/13/2016   Procedure: TOTAL KNEE ARTHROPLASTY WITH RIGHT KNEE CORTISONE INJECTION;  Surgeon: Connor Pear, MD;  Location: [facility];  Service: Orthopedics;  Laterality: Left;  . uric acid 6.9  12/22/04  . x-ray l great toe  07/21/00       [facility] Past Surgical History:  Procedure Laterality Date  . BMI 30.2 muscular  12/08/04  . curretage actinic keratosis Adams ear  01/20/05  . ETT low prob of CAD  11/21/03  . excision melanoma in situ back  01/20/05  . hemorrhoids sclerosed at colonoscopy  12/19/05  . IR IMAGING GUIDED PORT INSERTION  05/19/2020  . LAPAROSCOPIC INGUINAL HERNIA REPAIR Left 12/20/1995  . LUMBAR DISC SURGERY    . NERVE REPAIR     back of neck  . retinal laser surgery Left 09/20/1986  . SEPTOPLASTY  11/19/98  . TOTAL KNEE ARTHROPLASTY Left 12/13/2016   Procedure: TOTAL KNEE ARTHROPLASTY WITH RIGHT KNEE CORTISONE INJECTION;  Surgeon: Connor Pear, MD;  Location: [facility];  Service: Orthopedics;  Laterality: Left;  . uric acid 6.9  12/22/04  . x-ray l great toe  07/21/00  quency Provider Last Rate Last Admin  . acetaminophen (TYLENOL) tablet 650 mg  650 mg Oral Q6H PRN Connor Sansoucie R, NP      . allopurinol (ZYLOPRIM) tablet 300 mg  300 mg Oral Daily Connor [facility], Roselie Awkward, NP      . Chlorhexidine Gluconate Cloth 2 % PADS 6 each  6 each Topical Daily Connor Vazguez R, NP      . enoxaparin (LOVENOX) injection 40 mg  40 mg Subcutaneous Q24H Connor Ivins R, NP      . feeding supplement (ENSURE ENLIVE / ENSURE PLUS) liquid 237 mL  237 mL Oral BID BM [facility], Roselie Awkward, NP      . hydrocortisone (ANUSOL-HC) 2.5 % rectal cream 1 application  1 application Rectal Daily PRN Connor [facility], Roselie Awkward, NP      . losartan (COZAAR) tablet 25 mg  25 mg Oral Daily Connor Buschman R, NP      . multivitamins ther. w/minerals tablet 1 tablet  1 tablet Oral Daily [facility] R, NP      . ondansetron (ZOFRAN-ODT) disintegrating tablet 8 mg  8 mg  Oral Q8H PRN Connor Barillas R, NP      . pantoprazole (PROTONIX) EC tablet 40 mg  40 mg Oral BID Connor Livesey R, NP      . polyethylene glycol (MIRALAX / GLYCOLAX) packet 17 g  17 g Oral Daily Salisa Broz R, NP      . prochlorperazine (COMPAZINE) tablet 10 mg  10 mg Oral Q6H PRN Kaien Pezzullo R, NP      . rosuvastatin (CRESTOR) tablet 5 mg  5 mg Oral Daily Paighton Godette R, NP      . senna-docusate (Senokot-S) tablet 2 tablet  2 tablet Oral BID Maryanna Shape, NP      .  sodium bicarbonate/sodium chloride mouthwash [id number]  1 application Mouth Rinse PRN Jaella Weinert R, NP      . sodium chloride flush (NS) 0.9 % injection 10-40 mL  10-40 mL Intracatheter Q12H David Rodriquez R, NP      . sodium chloride flush (NS) 0.9 % injection 10-40 mL  10-40 mL Intracatheter PRN Nissan Frazzini R, NP      . sucralfate (CARAFATE) 1 GM/10ML suspension 1 g  1 g Oral TID WC & HS Chavy Avera, Roselie Awkward, NP           Family History  Problem Relation Age of Onset  . Liver cancer Sister   . Heart failure Brother 44       CABGx4  . Heart attack Mother 49  . Uterine cancer Mother   . Heart attack Father 71  . Asthma Son   . Diabetes Brother   . Prostate cancer Brother   Relation Age of Onset  . Liver cancer Sister   . Heart failure Brother 44       CABGx4  . Heart attack Mother 59  . Uterine cancer Mother   . Heart attack Father 65  . Asthma Son   . Diabetes Brother   . Prostate cancer Brother      Socioeconomic History  . Marital status: Married    Spouse name: [name]  . Number of children: 2  . Years of education: 93  . Highest education level: Not on file  Occupational History  . Occupation: Systems developer: [employer]  Tobacco Use  . Smoking status: Never Smoker  . Smokeless tobacco: Never Used  Vaping Use  . Vaping Use: Never used  Substance and Sexual Activity  . Alcohol use: No  . Drug use: No  . Sexual activity: Not on file  Other Topics Concern  .  Not on file  Social History Narrative   Married to [employer] at [employer].   Son, Camila [facility] married   Son, [name], Married.    Caffeine use: 1 cup coffee per day   Social Determinants of Health   Financial Resource Strain:   . Difficulty of Paying Living Expenses: Not on file  Food Insecurity:   . Worried About Charity fundraiser in the Last Year: Not on file  . Ran Out of Food in the Last Year: Not on file  Transportation Needs:   . Lack of Transportation (Medical): Not on file  . Lack of Transportation (Non-Medical): Not on file  Physical Activity:   . Days of Exercise per Week: Not on file  . Minutes of Exercise per Session: Not on file  Stress:   . Feeling of Stress : Not on file  Social Connections:   . Frequency of Communication with Friends and Family: Not on file  . Frequency of Social Gatherings with Friends and Family: Not on file  . Attends Religious Services: Not on file  . Active Member of Clubs or Organizations: Not on file  . Attends Archivist Meetings: Not on file  . Marital Status: Not on file  Intimate Partner Violence:   . Fear of Current or Ex-Partner: Not on file  . Emotionally Abused: Not on file  . Physically Abused: Not on file  . Sexually Abused: Not on file  :  Review of Systems: A comprehensive 14 point review of systems was negative except as noted in the HPI.  Exam: Patient Vitals  for the past 24 hrs:  BP Temp Temp src Pulse Resp SpO2  07/07/20 1050 (!) 144/64 98 F (36.7 C) Oral (!) 57 13 100 %    General:  well-nourished in no acute distress.   Eyes:  no scleral icterus.   ENT:  There were no oropharyngeal lesions.   Neck was without thyromegaly.   Lymphatics:  Negative cervical, supraclavicular or axillary adenopathy.   Respiratory: lungs were clear bilaterally without wheezing or crackles.   Cardiovascular:  Regular rate and rhythm, S1/S2, without murmur, rub or gallop.  There was no pedal edema.   GI:   Positive bowel sounds.  I was unable to palpate the mass in the left upper quadrant today.   Musculoskeletal:  no spinal tenderness of palpation of vertebral spine.   Skin exam was without echymosis, petichae.   Neuro exam was nonfocal. Patient was alert and oriented.  Attention was good.   Language was appropriate.  Mood was normal without depression.  Speech was not pressured.  Thought content was not tangential.    CBC    Component Value Date/Time   WBC 5.7 08/25/2020 1127   WBC 10.6 (H) 08/22/2020 0528   RBC 3.24 (L) 08/25/2020 1127   HGB 9.1 (L) 08/25/2020 1127   HCT 29.5 (L) 08/25/2020 1127   PLT 278 08/25/2020 1127   MCV 91.0 08/25/2020 1127   MCH 28.1 08/25/2020 1127   MCHC 30.8 08/25/2020 1127   RDW 19.5 (H) 08/25/2020 1127   LYMPHSABS 0.1 (L) 08/25/2020 1127   MONOABS 0.0 (L) 08/25/2020 1127   EOSABS 0.0 08/25/2020 1127   BASOSABS 0.0 08/25/2020 1127   CMP Latest Ref Rng & Units 08/25/2020 08/22/2020 08/21/2020  Glucose 70 - 99 mg/dL 128(H) 130(H) 112(H)  BUN 8 - 23 mg/dL 25(H) 30(H) 32(H)  Creatinine 0.61 - 1.24 mg/dL 1.00 1.00 1.10  Sodium 135 - 145 mmol/L 142 143 141  Potassium 3.5 - 5.1 mmol/L 3.9 3.7 3.8  Chloride 98 - 111 mmol/L 106 109 109  CO2 22 - 32 mmol/L 31 26 25   Calcium 8.9 - 10.3 mg/dL 8.9 8.7(L) 8.7(L)  Total Protein 6.5 - 8.1 g/dL 5.6(L) 5.1(L) 5.1(L)  Total Bilirubin 0.3 - 1.2 mg/dL 0.7 0.5 0.4  Alkaline Phos 38 - 126 U/L 52 42 47  AST 15 - 41 U/L 15 17 15   ALT 0 - 44 U/L 17 19 18    No results found.   No results found.  Assessment and Plan:  Connor [facility] 74 y.o. male with medical history significant for Stage I DLBCL who presents for a follow up visit.  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  Recommend for him to proceed with EPO[facility] as an inpatient.  He is here to begin cycle #6 today.   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 Stage I bulky disease.   --Recommend EPOCH-[facility] -begin cycle #6 on 09/08/2020 pending review of today's labs. --Adverse effects have been reviewed including, but not limited to, alopecia, myelosuppression, peripheral neuropathy, mucositis, liver/renal dysfunction, and possible infusion reaction associated with Rituxan. He agrees to proceed. --PAC in place and ready for use. --Echocardiogram  from 05/22/2020 shows LVEF of 60-65%  --Will monitor closely with daily CBC with diff, CMET, LDH, and uric acid --Lovenox for DVT prophylaxis. --Continue home medications for HTN and hyperlipidemia.  --Continue PRN antiemetics. --He has scheduled MiraLAX daily and Senokot-S 2 tablets twice a day. --Sodium bicarbonate mouth rinses have been ordered 4 times a day and he has Magic mouthwash ordered as needed.   Labs, Rituxan, Neulasta are scheduled for 09/15/2020.  Outpatient follow-up visit will need to be scheduled.  [name], DNP, AGPCNP-BC, AOCNP

## 2020-09-08 NOTE — Progress Notes (Signed)
Chemotherapy dosing checked independently with Adela Ports, RN.  Dosing of Doxorubicin, Etoposide, and Vincristine checked based on patient's BSA and normal dosing of the medications.

## 2020-09-09 ENCOUNTER — Other Ambulatory Visit: Payer: Self-pay

## 2020-09-09 LAB — COMPREHENSIVE METABOLIC PANEL
ALT: 23 U/L (ref 0–44)
AST: 19 U/L (ref 15–41)
Albumin: 3.4 g/dL — ABNORMAL LOW (ref 3.5–5.0)
Alkaline Phosphatase: 60 U/L (ref 38–126)
Anion gap: 10 (ref 5–15)
BUN: 27 mg/dL — ABNORMAL HIGH (ref 8–23)
CO2: 24 mmol/L (ref 22–32)
Calcium: 9.1 mg/dL (ref 8.9–10.3)
Chloride: 108 mmol/L (ref 98–111)
Creatinine, Ser: 1.09 mg/dL (ref 0.61–1.24)
GFR, Estimated: >60 mL/min (ref >60)
Glucose, Bld: 133 mg/dL — ABNORMAL HIGH (ref 70–99)
Potassium: 4.2 mmol/L (ref 3.5–5.1)
Sodium: 142 mmol/L (ref 135–145)
Total Bilirubin: 0.4 mg/dL (ref 0.3–1.2)
Total Protein: 5.6 g/dL — ABNORMAL LOW (ref 6.5–8.1)

## 2020-09-09 LAB — CBC WITH DIFFERENTIAL/PLATELET
Abs Immature Granulocytes: 0.19 10*3/uL — ABNORMAL HIGH (ref 0.00–0.07)
Basophils Absolute: 0 10*3/uL (ref 0.0–0.1)
Basophils Relative: 0 %
Eosinophils Absolute: 0 10*3/uL (ref 0.0–0.5)
Eosinophils Relative: 0 %
HCT: 25.7 % — ABNORMAL LOW (ref 39.0–52.0)
Hemoglobin: 8 g/dL — ABNORMAL LOW (ref 13.0–17.0)
Immature Granulocytes: 2 %
Lymphocytes Relative: 3 %
Lymphs Abs: 0.3 10*3/uL — ABNORMAL LOW (ref 0.7–4.0)
MCH: 29.7 pg (ref 26.0–34.0)
MCHC: 31.1 g/dL (ref 30.0–36.0)
MCV: 95.5 fL (ref 80.0–100.0)
Monocytes Absolute: 0.6 10*3/uL (ref 0.1–1.0)
Monocytes Relative: 5 %
Neutro Abs: 11 10*3/uL — ABNORMAL HIGH (ref 1.7–7.7)
Neutrophils Relative %: 90 %
Platelets: 275 10*3/uL (ref 150–400)
RBC: 2.69 MIL/uL — ABNORMAL LOW (ref 4.22–5.81)
RDW: 20.1 % — ABNORMAL HIGH (ref 11.5–15.5)
WBC: 12.1 10*3/uL — ABNORMAL HIGH (ref 4.0–10.5)
nRBC: 0 % (ref 0.0–0.2)

## 2020-09-09 LAB — LACTATE DEHYDROGENASE: LDH: 152 U/L (ref 98–192)

## 2020-09-09 LAB — URIC ACID: Uric Acid, Serum: 6.1 mg/dL (ref 3.7–8.6)

## 2020-09-09 MED ORDER — POLYETHYLENE GLYCOL 3350 17 G PO PACK
17.0000 g | PACK | Freq: Every day | ORAL | Status: DC
  Administered 2020-09-09 – 2020-09-10 (×2): 17 g via ORAL
  Filled 2020-09-09 (×2): qty 1

## 2020-09-09 MED ORDER — SODIUM CHLORIDE 0.9 % IV SOLN
Freq: Once | INTRAVENOUS | Status: AC
  Administered 2020-09-09: 8 mg via INTRAVENOUS
  Filled 2020-09-09: qty 4

## 2020-09-09 MED ORDER — SODIUM CHLORIDE 0.9 % IV SOLN
Freq: Once | INTRAVENOUS | Status: AC
  Administered 2020-09-10: 8 mg via INTRAVENOUS
  Filled 2020-09-09: qty 4

## 2020-09-09 MED ORDER — SENNOSIDES-DOCUSATE SODIUM 8.6-50 MG PO TABS
2.0000 | ORAL_TABLET | Freq: Two times a day (BID) | ORAL | Status: DC
  Administered 2020-09-09 – 2020-09-12 (×7): 2 via ORAL
  Filled 2020-09-09 (×7): qty 2

## 2020-09-09 MED ORDER — VINCRISTINE SULFATE CHEMO INJECTION 1 MG/ML
Freq: Once | INTRAVENOUS | Status: AC
  Filled 2020-09-09: qty 14

## 2020-09-09 NOTE — Progress Notes (Signed)
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   1) 04/17/2020: CT A/P showed dominant nodal mass(11.5 x 10.9 cm)within the jejunal mesentery, highly suspicious for lymphoma. Additionally there are left lower lobe pleural-based pulmonary nodules 2) 04/24/2020: establish care with Dr. Lorenso Courier 3) 04/28/2020:  PET CT scan shows Large solid left mesenteric mass 13.0 cm with maximum SUV of 21.2, [place] 5 with local hypermetabolic porta hepatis and retroperitoneal lymph nodes. Constitutes bulky Stage I disease.   Reason for Admission: double hit lymphoma, Cycle #6 EPOCH-R  HPI: Mr. Deese is a 74 year old male with medical history significant for double hit DLBCL. He presents for Cycle 6 of R-EPOCH chemotherapy. Risks and benefits have been discussed and he has agreed to proceed  Interval History: The patient tolerated day one of his chemotherapy well overall.  He got about 3 hours of sleep last night.  Denies mucositis, nausea, vomiting.  Bowels moved last evening.  Denies fevers, chills, cough, shortness of breath.  He continues to maintain a good energy level and is ambulating in the hallway without any difficulty.    Diagnosis Date  . Arthritis   . Chronic kidney disease (CKD), stage III (moderate)   . COVID-19   . GERD (gastroesophageal reflux disease)    occ  . Hemorrhoids   . History of ETT 3/05   low risk  . History of hiatal hernia   . History of kidney stones   . HLD (hyperlipidemia)   . HTN (hypertension)   . Left inguinal hernia   . Melanoma ([place])    excied  . 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 Stroke [facility] 2011   tia   . TIA (transient ischemic attack) 4/11   associated w slurred speecha ndmild facial droop. lasted  for about 10 minutes. Had MRI, etc with [facility] that per his report was ok (dr. Stephannie Li). Echo (4/11): EF 55-60%, no regional WMAs, normal diastolic function, mild LAE, no source of embolus  Past Surgical History:  Procedure Laterality Date  . BMI 30.2 muscular  12/08/04  . curretage actinic keratosis R ear  01/20/05  . ETT low prob of CAD  11/21/03  . excision melanoma in situ back  01/20/05  . hemorrhoids sclerosed at colonoscopy  12/19/05  . IR IMAGING GUIDED PORT INSERTION  05/19/2020  . LAPAROSCOPIC INGUINAL HERNIA REPAIR Left 12/20/1995  . LUMBAR DISC SURGERY    . NERVE REPAIR     back of neck  . retinal laser surgery Left 09/20/1986  . SEPTOPLASTY  11/19/98  . TOTAL KNEE ARTHROPLASTY Left 12/13/2016   Procedure: TOTAL KNEE ARTHROPLASTY WITH RIGHT KNEE CORTISONE INJECTION;  Surgeon: Frederik Pear, MD;  Location: [place];  Service: Orthopedics;  Laterality: Left;  . uric acid 6.9  12/22/04  . x-ray l great toe  07/21/00       Fre Past Surgical History:  Procedure Laterality Date  . BMI 30.2 muscular  12/08/04  . curretage actinic keratosis R ear  01/20/05  . ETT low prob of CAD  11/21/03  . excision melanoma in situ back  01/20/05  . hemorrhoids sclerosed at colonoscopy  12/19/05  . IR IMAGING GUIDED PORT INSERTION  05/19/2020  . LAPAROSCOPIC INGUINAL HERNIA REPAIR Left 12/20/1995  . LUMBAR  [place] SURGERY    . NERVE REPAIR     back of neck  . retinal laser surgery Left 09/20/1986  . SEPTOPLASTY  11/19/98  . TOTAL KNEE ARTHROPLASTY Left 12/13/2016   Procedure: TOTAL KNEE ARTHROPLASTY WITH RIGHT KNEE CORTISONE INJECTION;  Surgeon: Frederik Pear, MD;  Location: [place];  Service: Orthopedics;  Laterality: Left;  . uric acid 6.9  12/22/04  . x-ray l great toe  07/21/00  quency Provider Last Rate Last Admin  . 0.9 %  sodium chloride infusion   Intravenous Continuous [name] IV, MD 20 mL/hr at 05/29/20 0400 Rate Verify at 05/29/20 0400  . allopurinol (ZYLOPRIM) tablet 300 mg  300 mg Oral Daily  [name] R, NP   300 mg at 05/29/20 1038  . aspirin tablet 325 mg  325 mg Oral Q4H PRN Maryanna Shape, NP      . Chlorhexidine Gluconate Cloth 2 % PADS 6 each  6 each Topical Daily Maryanna Shape, NP   6 each at 05/29/20 1038  . Cold Pack 1 packet  1 packet Topical Once PRN [name] IV, MD      . DOXOrubicin (ADRIAMYCIN) 20 mg, etoposide (VEPESID) 96 mg, vinCRIStine (ONCOVIN) 0.8 mg in sodium chloride 0.9 % 1,000 mL chemo infusion   Intravenous Once Orson Slick, MD 51 mL/hr at 05/28/20 1422 New Bag at 05/28/20 1422  . DOXOrubicin (ADRIAMYCIN) 20 mg, etoposide (VEPESID) 96 mg, vinCRIStine (ONCOVIN) 0.8 mg in sodium chloride 0.9 % 1,000 mL chemo infusion   Intravenous Once Narda Rutherford T IV, MD      . enoxaparin (LOVENOX) injection 40 mg  40 mg Subcutaneous Q24H [name] R, NP   40 mg at 05/28/20 1116  . Hot Pack 1 packet  1 packet Topical Once PRN [name] IV, MD      . hydrocortisone (ANUSOL-HC) 2.5 % rectal cream 1 application  1 application Rectal Daily PRN Maryanna Shape, NP      . losartan (COZAAR) tablet 25 mg  25 mg Oral Daily [name] R, NP   25 mg at 05/29/20 1038  . multivitamin with minerals tablet 1 tablet  1 tablet Oral Daily Maryanna Shape, NP   1 tablet at 05/29/20 1038  . ondansetron (ZOFRAN-ODT) disintegrating tablet 8 mg  8 mg Oral Q8H PRN Nari Vannatter R, NP      . pantoprazole (PROTONIX) EC tablet 40 mg  40 mg Oral BID [name] R, NP   40 mg at 05/29/20 1038  . polyethylene glycol (MIRALAX / GLYCOLAX) packet 17 g  17 g Oral Daily PRN Narda Rutherford T IV, MD      . predniSONE (DELTASONE) tablet 120 mg  120 mg Oral Q breakfast [name] IV, MD   120 mg at 05/29/20 0819  . prochlorperazine (COMPAZINE) tablet 10 mg  10 mg Oral Q6H PRN [name] R, NP      . rosuvastatin (CRESTOR) tablet 5 mg  5 mg Oral QPM Rinoa Garramone R, NP   5 mg at 05/28/20 1744  . senna-docusate (Senokot-S) tablet 2 tablet  2 tablet Oral BID  Orson Slick, MD   2 tablet at 05/29/20 1038  . sodium bicarbonate/sodium chloride mouthwash 1084m   Mouth Rinse PRN Vignesh Willert R, NP      . sodium chloride flush (NS) 0.9 % injection 10-40 mL  10-40 mL Intracatheter Q12H Nalina Yeatman, KRoselie Awkward NP  10 mL at 05/28/20 2129  . sodium chloride flush (NS) 0.9 % injection 10-40 mL  10-40 mL Intracatheter PRN Janson Lamar R, NP      . sucralfate (CARAFATE) 1 GM/10ML suspension 1 g  1 g Oral TID WC & HS Makya Yurko, Roselie Awkward, NP   1 g at 05/29/20 1610       Family History  Problem Relation Age of Onset  . Liver cancer Sister   . Heart failure Brother 44       CABGx4  . Heart attack Mother 18  . Uterine cancer Mother   . Heart attack Father 7  . Asthma Son   . Diabetes Brother   . Prostate cancer Brother   Relation Age of Onset  . Liver cancer Sister   . Heart failure Brother 44       CABGx4  . Heart attack Mother 5  . Uterine cancer Mother   . Heart attack Father 45  . Asthma Son   . Diabetes Brother   . Prostate cancer Brother      Socioeconomic History  . Marital status: Married    Spouse name: [name]  . Number of children: 2  . Years of education: 72  . Highest education level: Not on file  Occupational History  . Occupation: Systems developer: [employer]  Tobacco Use  . Smoking status: Never Smoker  . Smokeless tobacco: Never Used  Vaping Use  . Vaping Use: Never used  Substance and Sexual Activity  . Alcohol use: No  . Drug use: No  . Sexual activity: Not on file  Other Topics Concern  . Not on file  Social History Narrative   Married to [employer] at [employer].   Son, [name] married   Son, [name], Married.    Caffeine use: 1 cup coffee per day   Social Determinants of Health   Financial Resource Strain:   . Difficulty of Paying Living Expenses: Not on file  Food Insecurity:   . Worried About Charity fundraiser in the Last Year: Not on file  . Ran Out of Food in  the Last Year: Not on file  Transportation Needs:   . Lack of Transportation (Medical): Not on file  . Lack of Transportation (Non-Medical): Not on file  Physical Activity:   . Days of Exercise per Week: Not on file  . Minutes of Exercise per Session: Not on file  Stress:   . Feeling of Stress : Not on file  Social Connections:   . Frequency of Communication with Friends and Family: Not on file  . Frequency of Social Gatherings with Friends and Family: Not on file  . Attends Religious Services: Not on file  . Active Member of Clubs or Organizations: Not on file  . Attends Archivist Meetings: Not on file  . Marital Status: Not on file  Intimate Partner Violence:   . Fear of Current or Ex-Partner: Not on file  . Emotionally Abused: Not on file  . Physically Abused: Not on file  . Sexually Abused: Not on file  :  Review of Systems: A comprehensive 14 point review of systems was negative except as noted in the HPI.  Exam: Patient Vitals for the past 24 hrs: BP 104/66 (BP Location: Left Arm)   Pulse (!) 55   Temp 98.2 F (36.8 C) (Oral)   Resp 16   Ht _0  (1.727 m)  Wt 74.4 kg   SpO2 100%   BMI 24.94 kg/m    General:  well-nourished in no acute distress.   Eyes:  no scleral icterus.   ENT:  There were no oropharyngeal lesions.   Neck was without thyromegaly.   Lymphatics:  Negative cervical, supraclavicular or axillary adenopathy.   Respiratory: lungs were clear bilaterally without wheezing or crackles.   Cardiovascular:  Regular rate and rhythm, S1/S2, without murmur, rub or gallop.  There was no pedal edema.   GI:  Positive bowel sounds.  Left upper quadrant mass not palpable. Musculoskeletal:  no spinal tenderness of palpation of vertebral spine.   Skin exam was without echymosis, petichae.   Neuro exam was nonfocal. Patient was alert and oriented.  Attention was good.   Language was appropriate.  Mood was normal without depression.  Speech was not  pressured.  Thought content was not tangential.    CBC    Component Value Date/Time   WBC 12.1 (H) 09/09/2020 0525   RBC 2.69 (L) 09/09/2020 0525   HGB 8.0 (L) 09/09/2020 0525   HGB 9.1 (L) 08/25/2020 1127   HCT 25.7 (L) 09/09/2020 0525   PLT 275 09/09/2020 0525   PLT 278 08/25/2020 1127   MCV 95.5 09/09/2020 0525   MCH 29.7 09/09/2020 0525   MCHC 31.1 09/09/2020 0525   RDW 20.1 (H) 09/09/2020 0525   LYMPHSABS 0.3 (L) 09/09/2020 0525   MONOABS 0.6 09/09/2020 0525   EOSABS 0.0 09/09/2020 0525   BASOSABS 0.0 09/09/2020 0525   CMP Latest Ref Rng & Units 09/09/2020 09/08/2020 08/25/2020  Glucose 70 - 99 mg/dL 133(H) 151(H) 128(H)  BUN 8 - 23 mg/dL 27(H) 22 25(H)  Creatinine 0.61 - 1.24 mg/dL 1.09 1.16 1.00  Sodium 135 - 145 mmol/L 142 140 142  Potassium 3.5 - 5.1 mmol/L 4.2 4.1 3.9  Chloride 98 - 111 mmol/L 108 105 106  CO2 22 - 32 mmol/L _0 Calcium 8.9 - 10.3 mg/dL 9.1 9.2 8.9  Total Protein 6.5 - 8.1 g/dL 5.6(L) 6.4(L) 5.6(L)  Total Bilirubin 0.3 - 1.2 mg/dL 0.4 0.4 0.7  Alkaline Phos 38 - 126 U/L 60 73 52  AST 15 - 41 U/L _1 ALT 0 - 44 U/L _2 ECHOCARDIOGRAM COMPLETE  Result Date: 05/22/2020    ECHOCARDIOGRAM REPORT   Patient Name:   JESS TONEY Kari Date of Exam: 05/22/2020 Medical Rec #:  [id number]        Height:       68.0 in Accession #:    [id number]       Weight:       171.0 lb Date of Birth:  Mar 18, 1946        BSA:          1.912 m Patient Age:    71 years         BP:           116/77 mmHg Patient Gender: M                HR:           80 bpm. Exam Location:  Outpatient Procedure: 2D Echo, Cardiac Doppler and Color Doppler Indications:    Chemotherapy evaluation v87.41 / v58.11  History:        Patient has no prior history of Echocardiogram examinations. TIA                 and  Stroke; Risk Factors:Hypertension, Dyslipidemia and GERD.  Sonographer:    Bernadene Person RDCS Referring Phys: [id number] [place]  1. Left ventricular ejection  fraction, by estimation, is 60 to 65%. The left ventricle has normal function. The left ventricle has no regional wall motion abnormalities. Left ventricular diastolic parameters are consistent with Grade I diastolic dysfunction (impaired relaxation). The average left ventricular global longitudinal strain is -17.6 %. The global longitudinal strain is normal.  2. Right ventricular systolic function is normal. The right ventricular size is normal.  3. The mitral valve is normal in structure. No evidence of mitral valve regurgitation. No evidence of mitral stenosis.  4. The aortic valve is normal in structure. Aortic valve regurgitation is not visualized. No aortic stenosis is present.  5. The inferior vena cava is normal in size with greater than 50% respiratory variability, suggesting right atrial pressure of 3 mmHg. FINDINGS  Left Ventricle: Left ventricular ejection fraction, by estimation, is 60 to 65%. The left ventricle has normal function. The left ventricle has no regional wall motion abnormalities. The average left ventricular global longitudinal strain is -17.6 %. The global longitudinal strain is normal. The left ventricular internal cavity size was normal in size. There is no left ventricular hypertrophy. Left ventricular diastolic parameters are consistent with Grade I diastolic dysfunction (impaired relaxation). Right Ventricle: The right ventricular size is normal. No increase in right ventricular wall thickness. Right ventricular systolic function is normal. Left Atrium: Left atrial size was normal in size. Right Atrium: Right atrial size was normal in size. Pericardium: There is no evidence of pericardial effusion. Mitral Valve: The mitral valve is normal in structure. Normal mobility of the mitral valve leaflets. No evidence of mitral valve regurgitation. No evidence of mitral valve stenosis. Tricuspid Valve: The tricuspid valve is normal in structure. Tricuspid valve regurgitation is not  demonstrated. No evidence of tricuspid stenosis. Aortic Valve: The aortic valve is normal in structure. Aortic valve regurgitation is not visualized. No aortic stenosis is present. Pulmonic Valve: The pulmonic valve was normal in structure. Pulmonic valve regurgitation is trivial. No evidence of pulmonic stenosis. Aorta: The aortic root is normal in size and structure. Venous: The inferior vena cava is normal in size with greater than 50% respiratory variability, suggesting right atrial pressure of 3 mmHg. IAS/Shunts: No atrial level shunt detected by color flow Doppler.  LEFT VENTRICLE PLAX 2D LVIDd:         5.10 cm  Diastology LVIDs:         3.20 cm  LV e' lateral:   9.94 cm/s LV PW:         0.70 cm  LV E/e' lateral: 4.6 LV IVS:        0.70 cm  LV e' medial:    6.85 cm/s LVOT diam:     2.10 cm  LV E/e' medial:  6.6 LV SV:         81 LV SV Index:   43       2D Longitudinal Strain LVOT Area:     3.46 cm 2D Strain GLS Avg:     -17.6 %  RIGHT VENTRICLE RV S prime:     9.79 cm/s TAPSE (M-mode): 1.6 cm LEFT ATRIUM             Index       RIGHT ATRIUM           Index LA diam:        3.70 cm  1.94 cm/m  RA Area:     14.40 cm LA Vol (A2C):   69.5 ml 36.35 ml/m RA Volume:   31.90 ml  16.68 ml/m LA Vol (A4C):   51.0 ml 26.67 ml/m LA Biplane Vol: 59.4 ml 31.07 ml/m  AORTIC VALVE LVOT Vmax:   143.00 cm/s LVOT Vmean:  98.900 cm/s LVOT VTI:    0.235 m  AORTA Ao Root diam: 3.20 cm Ao Asc diam:  3.40 cm MITRAL VALVE MV Area (PHT): 2.22 cm    SHUNTS MV Decel Time: 342 msec    Systemic VTI:  0.24 m MV E velocity: 45.30 cm/s  Systemic Diam: 2.10 cm MV A velocity: 72.50 cm/s MV E/A ratio:  0.62 Candee Furbish MD Electronically signed by Candee Furbish MD Signature Date/Time: 05/22/2020/12:55:40 PM    Final    IR IMAGING GUIDED PORT INSERTION  Result Date: 05/19/2020 INDICATION: History of diffuse large B-cell lymphoma. In need of durable intravenous access for chemotherapy administration EXAM: IMPLANTED PORT A CATH PLACEMENT WITH  ULTRASOUND AND FLUOROSCOPIC GUIDANCE COMPARISON:  PET-CT-04/28/2020 MEDICATIONS: Ancef 2 gm IV; The antibiotic was administered within an appropriate time interval prior to skin puncture. ANESTHESIA/SEDATION: Moderate (conscious) sedation was employed during this procedure. A total of Versed 2 mg and Fentanyl 100 mcg was administered intravenously. Moderate Sedation Time: 25 minutes. The patient's level of consciousness and vital signs were monitored continuously by radiology nursing throughout the procedure under my direct supervision. CONTRAST:  None FLUOROSCOPY TIME:  24 seconds (5 mGy) COMPLICATIONS: None immediate. PROCEDURE: The procedure, risks, benefits, and alternatives were explained to the patient. Questions regarding the procedure were encouraged and answered. The patient understands and consents to the procedure. The right neck and chest were prepped with chlorhexidine in a sterile fashion, and a sterile drape was applied covering the operative field. Maximum barrier sterile technique with sterile gowns and gloves were used for the procedure. A timeout was performed prior to the initiation of the procedure. Local anesthesia was provided with 1% lidocaine with epinephrine. After creating a small venotomy incision, a micropuncture kit was utilized to access the internal jugular vein. Real-time ultrasound guidance was utilized for vascular access including the acquisition of a permanent ultrasound image documenting patency of the accessed vessel. The microwire was utilized to measure appropriate catheter length. A subcutaneous port pocket was then created along the upper chest wall utilizing a combination of sharp and blunt dissection. The pocket was irrigated with sterile saline. A single lumen "Slim" sized power injectable port was chosen for placement. The 8 Fr catheter was tunneled from the port pocket site to the venotomy incision. The port was placed in the pocket. The external catheter was trimmed  to appropriate length. At the venotomy, an 8 Fr peel-away sheath was placed over a guidewire under fluoroscopic guidance. The catheter was then placed through the sheath and the sheath was removed. Final catheter positioning was confirmed and documented with a fluoroscopic spot radiograph. The port was accessed with a Huber needle, aspirated and flushed with heparinized saline. The venotomy site was closed with an interrupted 4-0 Vicryl suture. The port pocket incision was closed with interrupted 2-0 Vicryl suture. The skin was opposed with a running subcuticular 4-0 Vicryl suture. Dermabond and Steri-strips were applied to both incisions. Dressings were applied. The patient tolerated the procedure well without immediate post procedural complication. FINDINGS: After catheter placement, the tip lies within the superior cavoatrial junction. The catheter aspirates and flushes normally and is ready for immediate use. IMPRESSION:  Successful placement of a right internal jugular approach power injectable Port-A-Cath. The catheter is ready for immediate use. Electronically Signed   By: Sandi Mariscal M.D.   On: 05/19/2020 15:59     CT Biopsy  Result Date: 05/08/2020 CLINICAL DATA:  13 cm left-sided mesenteric abdominal mass. EXAM: CT GUIDED CORE BIOPSY OF MESENTERIC MASS ANESTHESIA/SEDATION: 1.5 mg IV Versed; 75 mcg IV Fentanyl Total Moderate Sedation Time:  18 minutes. The patient's level of consciousness and physiologic status were continuously monitored during the procedure by Radiology nursing. PROCEDURE: The procedure risks, benefits, and alternatives were explained to the patient. Questions regarding the procedure were encouraged and answered. The patient understands and consents to the procedure. A time-out was performed prior to initiating the procedure. CT was performed through the abdomen in a supine position. The left abdominal wall was prepped with chlorhexidine in a sterile fashion, and a sterile drape was  applied covering the operative field. A sterile gown and sterile gloves were used for the procedure. Local anesthesia was provided with 1% Lidocaine. Under CT guidance, a 17 gauge trocar needle was advanced to the level of a left-sided mesenteric mass. After confirming needle tip position, 4 separate coaxial 18 gauge core biopsy samples were obtained and submitted in saline. Some Gel-Foam pledgets were advanced through the outer needle as the needle was retracted and removed. COMPLICATIONS: None FINDINGS: Large left-sided intra-abdominal mass within the mesentery measures up to 13.3 cm in maximum diameter. Solid tissue was obtained. IMPRESSION: CT-guided core biopsy performed of large left-sided mesenteric mass measuring just over 13 cm in estimated maximal diameter. Electronically Signed   By: Aletta Edouard M.D.   On: 05/08/2020 15:02   ECHOCARDIOGRAM COMPLETE  Result Date: 05/22/2020    ECHOCARDIOGRAM REPORT   Patient Name:   SABIN GIBEAULT [facility] Date of Exam: 05/22/2020 Medical Rec #:  [id number]        Height:       68.0 in Accession #:    [id number]       Weight:       171.0 lb Date of Birth:  12-15-1945        BSA:          1.912 m Patient Age:    58 years         BP:           116/77 mmHg Patient Gender: M                HR:           80 bpm. Exam Location:  Outpatient Procedure: 2D Echo, Cardiac Doppler and Color Doppler Indications:    Chemotherapy evaluation v87.41 / v58.11  History:        Patient has no prior history of Echocardiogram examinations. TIA                 and Stroke; Risk Factors:Hypertension, Dyslipidemia and GERD.  Sonographer:    Bernadene Person RDCS Referring Phys: [id number] [place]  1. Left ventricular ejection fraction, by estimation, is 60 to 65%. The left ventricle has normal function. The left ventricle has no regional wall motion abnormalities. Left ventricular diastolic parameters are consistent with Grade I diastolic dysfunction (impaired relaxation). The average  left ventricular global longitudinal strain is -17.6 %. The global longitudinal strain is normal.  2. Right ventricular systolic function is normal. The right ventricular size is normal.  3. The mitral valve is normal in structure. No evidence of  mitral valve regurgitation. No evidence of mitral stenosis.  4. The aortic valve is normal in structure. Aortic valve regurgitation is not visualized. No aortic stenosis is present.  5. The inferior vena cava is normal in size with greater than 50% respiratory variability, suggesting right atrial pressure of 3 mmHg. FINDINGS  Left Ventricle: Left ventricular ejection fraction, by estimation, is 60 to 65%. The left ventricle has normal function. The left ventricle has no regional wall motion abnormalities. The average left ventricular global longitudinal strain is -17.6 %. The global longitudinal strain is normal. The left ventricular internal cavity size was normal in size. There is no left ventricular hypertrophy. Left ventricular diastolic parameters are consistent with Grade I diastolic dysfunction (impaired relaxation). Right Ventricle: The right ventricular size is normal. No increase in right ventricular wall thickness. Right ventricular systolic function is normal. Left Atrium: Left atrial size was normal in size. Right Atrium: Right atrial size was normal in size. Pericardium: There is no evidence of pericardial effusion. Mitral Valve: The mitral valve is normal in structure. Normal mobility of the mitral valve leaflets. No evidence of mitral valve regurgitation. No evidence of mitral valve stenosis. Tricuspid Valve: The tricuspid valve is normal in structure. Tricuspid valve regurgitation is not demonstrated. No evidence of tricuspid stenosis. Aortic Valve: The aortic valve is normal in structure. Aortic valve regurgitation is not visualized. No aortic stenosis is present. Pulmonic Valve: The pulmonic valve was normal in structure. Pulmonic valve regurgitation is  trivial. No evidence of pulmonic stenosis. Aorta: The aortic root is normal in size and structure. Venous: The inferior vena cava is normal in size with greater than 50% respiratory variability, suggesting right atrial pressure of 3 mmHg. IAS/Shunts: No atrial level shunt detected by color flow Doppler.  LEFT VENTRICLE PLAX 2D LVIDd:         5.10 cm  Diastology LVIDs:         3.20 cm  LV e' lateral:   9.94 cm/s LV PW:         0.70 cm  LV E/e' lateral: 4.6 LV IVS:        0.70 cm  LV e' medial:    6.85 cm/s LVOT diam:     2.10 cm  LV E/e' medial:  6.6 LV SV:         81 LV SV Index:   43       2D Longitudinal Strain LVOT Area:     3.46 cm 2D Strain GLS Avg:     -17.6 %  RIGHT VENTRICLE RV S prime:     9.79 cm/s TAPSE (M-mode): 1.6 cm LEFT ATRIUM             Index       RIGHT ATRIUM           Index LA diam:        3.70 cm 1.94 cm/m  RA Area:     14.40 cm LA Vol (A2C):   69.5 ml 36.35 ml/m RA Volume:   31.90 ml  16.68 ml/m LA Vol (A4C):   51.0 ml 26.67 ml/m LA Biplane Vol: 59.4 ml 31.07 ml/m  AORTIC VALVE LVOT Vmax:   143.00 cm/s LVOT Vmean:  98.900 cm/s LVOT VTI:    0.235 m  AORTA Ao Root diam: 3.20 cm Ao Asc diam:  3.40 cm MITRAL VALVE MV Area (PHT): 2.22 cm    SHUNTS MV Decel Time: 342 msec    Systemic VTI:  0.24 m MV E  velocity: 45.30 cm/s  Systemic Diam: 2.10 cm MV A velocity: 72.50 cm/s MV E/A ratio:  0.62 Candee Furbish MD Electronically signed by Candee Furbish MD Signature Date/Time: 05/22/2020/12:55:40 PM    Final    IR IMAGING GUIDED PORT INSERTION  Result Date: 05/19/2020 INDICATION: History of diffuse large B-cell lymphoma. In need of durable intravenous access for chemotherapy administration EXAM: IMPLANTED PORT A CATH PLACEMENT WITH ULTRASOUND AND FLUOROSCOPIC GUIDANCE COMPARISON:  PET-CT-04/28/2020 MEDICATIONS: Ancef 2 gm IV; The antibiotic was administered within an appropriate time interval prior to skin puncture. ANESTHESIA/SEDATION: Moderate (conscious) sedation was employed during this procedure.  A total of Versed 2 mg and Fentanyl 100 mcg was administered intravenously. Moderate Sedation Time: 25 minutes. The patient's level of consciousness and vital signs were monitored continuously by radiology nursing throughout the procedure under my direct supervision. CONTRAST:  None FLUOROSCOPY TIME:  24 seconds (5 mGy) COMPLICATIONS: None immediate. PROCEDURE: The procedure, risks, benefits, and alternatives were explained to the patient. Questions regarding the procedure were encouraged and answered. The patient understands and consents to the procedure. The right neck and chest were prepped with chlorhexidine in a sterile fashion, and a sterile drape was applied covering the operative field. Maximum barrier sterile technique with sterile gowns and gloves were used for the procedure. A timeout was performed prior to the initiation of the procedure. Local anesthesia was provided with 1% lidocaine with epinephrine. After creating a small venotomy incision, a micropuncture kit was utilized to access the internal jugular vein. Real-time ultrasound guidance was utilized for vascular access including the acquisition of a permanent ultrasound image documenting patency of the accessed vessel. The microwire was utilized to measure appropriate catheter length. A subcutaneous port pocket was then created along the upper chest wall utilizing a combination of sharp and blunt dissection. The pocket was irrigated with sterile saline. A single lumen "Slim" sized power injectable port was chosen for placement. The 8 Fr catheter was tunneled from the port pocket site to the venotomy incision. The port was placed in the pocket. The external catheter was trimmed to appropriate length. At the venotomy, an 8 Fr peel-away sheath was placed over a guidewire under fluoroscopic guidance. The catheter was then placed through the sheath and the sheath was removed. Final catheter positioning was confirmed and documented with a fluoroscopic  spot radiograph. The port was accessed with a Huber needle, aspirated and flushed with heparinized saline. The venotomy site was closed with an interrupted 4-0 Vicryl suture. The port pocket incision was closed with interrupted 2-0 Vicryl suture. The skin was opposed with a running subcuticular 4-0 Vicryl suture. Dermabond and Steri-strips were applied to both incisions. Dressings were applied. The patient tolerated the procedure well without immediate post procedural complication. FINDINGS: After catheter placement, the tip lies within the superior cavoatrial junction. The catheter aspirates and flushes normally and is ready for immediate use. IMPRESSION: Successful placement of a right internal jugular approach power injectable Port-A-Cath. The catheter is ready for immediate use. Electronically Signed   By: Sandi Mariscal M.D.   On: 05/19/2020 15:59    Assessment and Plan:  RAS KOLLMAN 74 y.o. male with medical history significant for Stage I DLBCL who presents for a follow up visit.  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  He is currently receiving cycle #6 of EPOCH-R starting 09/08/2020.  Today he is Cycle 6 Day 2 of treatment.  He is tolerating his chemotherapy well so far with no nausea or vomiting.  Bowels are moving and he has a good appetite.  There are no barriers to proceeding with treatment.      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 Stage I bulky disease.   --Continue EPOCH-R. Today is Cycle 6 Day 2 --Adverse effects have been reviewed including, but not limited to, alopecia, myelosuppression, peripheral  neuropathy, mucositis, liver/renal dysfunction, and possible infusion reaction associated with Rituxan. He agrees to proceed. --PAC in place and ready for use. --Echocardiogram from 05/22/2020 shows LVEF of 60-65%  --CBC adequate to proceed with treatment today.  Leukocytosis likely related to steroids.  Anemia slightly worse this morning.  He is asymptomatic.  Discussed with patient that he may need PRBC transfusion this admission.   --Will monitor closely with CBC with diff, CMET, LDH, and uric acid --Lovenox for DVT prophylaxis. --Continue home medications for HTN and hyperlipidemia.  --Continue PRN antiemetics. --Continue MiraLAX and Senokot.  #Pulmonary nodules of unclear etiology --PET scan from 08/15/2020 showed new bilateral pulmonary nodules --Etiology unclear --Appreciate pulmonology consult.  They recommend repeat CT scan of the chest in approximately 6 weeks to reevaluate.  The patient is scheduled for labs, follow-up visit, Rituxan, and Neulasta on Monday, 08/25/2020.  [name], DNP, AGPCNP-BC, AOCNP

## 2020-09-10 DIAGNOSIS — R918 Other nonspecific abnormal finding of lung field: Secondary | ICD-10-CM

## 2020-09-10 DIAGNOSIS — D72829 Elevated white blood cell count, unspecified: Secondary | ICD-10-CM

## 2020-09-10 DIAGNOSIS — E785 Hyperlipidemia, unspecified: Secondary | ICD-10-CM

## 2020-09-10 DIAGNOSIS — I10 Essential (primary) hypertension: Secondary | ICD-10-CM

## 2020-09-10 LAB — COMPREHENSIVE METABOLIC PANEL
ALT: 21 U/L (ref 0–44)
AST: 18 U/L (ref 15–41)
Albumin: 3.1 g/dL — ABNORMAL LOW (ref 3.5–5.0)
Alkaline Phosphatase: 51 U/L (ref 38–126)
Anion gap: 9 (ref 5–15)
BUN: 30 mg/dL — ABNORMAL HIGH (ref 8–23)
CO2: 24 mmol/L (ref 22–32)
Calcium: 8.8 mg/dL — ABNORMAL LOW (ref 8.9–10.3)
Chloride: 110 mmol/L (ref 98–111)
Creatinine, Ser: 1 mg/dL (ref 0.61–1.24)
GFR, Estimated: >60 mL/min (ref >60)
Glucose, Bld: 114 mg/dL — ABNORMAL HIGH (ref 70–99)
Potassium: 4.1 mmol/L (ref 3.5–5.1)
Sodium: 143 mmol/L (ref 135–145)
Total Bilirubin: 0.4 mg/dL (ref 0.3–1.2)
Total Protein: 5.3 g/dL — ABNORMAL LOW (ref 6.5–8.1)

## 2020-09-10 LAB — CBC WITH DIFFERENTIAL/PLATELET
Abs Immature Granulocytes: 0.14 10*3/uL — ABNORMAL HIGH (ref 0.00–0.07)
Basophils Absolute: 0 10*3/uL (ref 0.0–0.1)
Basophils Relative: 0 %
Eosinophils Absolute: 0 10*3/uL (ref 0.0–0.5)
Eosinophils Relative: 0 %
HCT: 25.7 % — ABNORMAL LOW (ref 39.0–52.0)
Hemoglobin: 7.8 g/dL — ABNORMAL LOW (ref 13.0–17.0)
Immature Granulocytes: 1 %
Lymphocytes Relative: 2 %
Lymphs Abs: 0.2 10*3/uL — ABNORMAL LOW (ref 0.7–4.0)
MCH: 29.5 pg (ref 26.0–34.0)
MCHC: 30.4 g/dL (ref 30.0–36.0)
MCV: 97.3 fL (ref 80.0–100.0)
Monocytes Absolute: 0.7 10*3/uL (ref 0.1–1.0)
Monocytes Relative: 5 %
Neutro Abs: 12.4 10*3/uL — ABNORMAL HIGH (ref 1.7–7.7)
Neutrophils Relative %: 92 %
Platelets: 283 10*3/uL (ref 150–400)
RBC: 2.64 MIL/uL — ABNORMAL LOW (ref 4.22–5.81)
RDW: 20.2 % — ABNORMAL HIGH (ref 11.5–15.5)
WBC: 13.4 10*3/uL — ABNORMAL HIGH (ref 4.0–10.5)
nRBC: 0 % (ref 0.0–0.2)

## 2020-09-10 LAB — URIC ACID: Uric Acid, Serum: 6.3 mg/dL (ref 3.7–8.6)

## 2020-09-10 LAB — LACTATE DEHYDROGENASE: LDH: 131 U/L (ref 98–192)

## 2020-09-10 MED ORDER — POLYETHYLENE GLYCOL 3350 17 G PO PACK
17.0000 g | PACK | Freq: Two times a day (BID) | ORAL | Status: DC
  Administered 2020-09-10 – 2020-09-12 (×4): 17 g via ORAL
  Filled 2020-09-10 (×4): qty 1

## 2020-09-10 MED ORDER — SODIUM CHLORIDE 0.9 % IV SOLN
Freq: Once | INTRAVENOUS | Status: AC
  Administered 2020-09-11: 8 mg via INTRAVENOUS
  Filled 2020-09-10: qty 4

## 2020-09-10 MED ORDER — VINCRISTINE SULFATE CHEMO INJECTION 1 MG/ML
Freq: Once | INTRAVENOUS | Status: AC
  Filled 2020-09-10: qty 14

## 2020-09-10 NOTE — Progress Notes (Signed)
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   1) 04/17/2020: CT A/P showed dominant nodal mass(11.5 x 10.9 cm)within the jejunal mesentery, highly suspicious for lymphoma. Additionally there are left lower lobe pleural-based pulmonary nodules 2) 04/24/2020: establish care with Dr. Dorsey 3) 04/28/2020:  PET CT scan shows Large solid left mesenteric mass 13.0 cm with maximum SUV of 21.2, Deauville 5 with local hypermetabolic porta hepatis and retroperitoneal lymph nodes. Constitutes bulky Stage I disease.   Reason for Admission: double hit lymphoma, Cycle #6 EPOCH-R  HPI: Connor Adams is a 74 year old male with medical history significant for double hit DLBCL. He presents for Cycle 6 of R-EPOCH chemotherapy. Risks and benefits have been discussed and he has agreed to proceed  Interval History: The patient tolerated day two of his chemotherapy well overall. Denies mucositis, nausea, vomiting.  Small BM today, requesting increase in miralax.  Denies fevers, chills, cough, shortness of breath.  He continues to maintain a good energy level and is ambulating in the hallway multiple times per day without any difficulty.    Diagnosis Date  . Arthritis   . Chronic kidney disease (CKD), stage III (moderate)   . COVID-19   . GERD (gastroesophageal reflux disease)    occ  . Hemorrhoids   . History of ETT 3/05   low risk  . History of hiatal hernia   . History of kidney stones   . HLD (hyperlipidemia)   . HTN (hypertension)   . Left inguinal hernia   . Melanoma (HCC)    excied  . 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 Stroke (HCC) 2011   tia   . TIA (transient ischemic attack) 4/11   associated w slurred speecha ndmild facial droop. lasted  for about 10 minutes. Had MRI, etc with [facility] that per his report was ok (dr. Penumallli). Echo (4/11): EF 55-60%, no regional WMAs, normal diastolic function, mild LAE, no source of embolus  Past Surgical History:  Procedure Laterality Date  . BMI 30.2 muscular  12/08/04  . curretage actinic keratosis R ear  01/20/05  . ETT low prob of CAD  11/21/03  . excision melanoma in situ back  01/20/05  . hemorrhoids sclerosed at colonoscopy  12/19/05  . IR IMAGING GUIDED PORT INSERTION  05/19/2020  . LAPAROSCOPIC INGUINAL HERNIA REPAIR Left 12/20/1995  . LUMBAR DISC SURGERY    . NERVE REPAIR     back of neck  . retinal laser surgery Left 09/20/1986  . SEPTOPLASTY  11/19/98  . TOTAL KNEE ARTHROPLASTY Left 12/13/2016   Procedure: TOTAL KNEE ARTHROPLASTY WITH RIGHT KNEE CORTISONE INJECTION;  Surgeon: Frank Rowan, MD;  Location: [facility] OR;  Service: Orthopedics;  Laterality: Left;  . uric acid 6.9  12/22/04  . x-ray l great toe  07/21/00       [facility] Past Surgical History:  Procedure Laterality Date  . BMI 30.2 muscular  12/08/04  . curretage actinic keratosis R ear  01/20/05  . ETT low prob of CAD  11/21/03  . excision melanoma in situ back  01/20/05  . hemorrhoids sclerosed at colonoscopy  12/19/05  . IR IMAGING GUIDED PORT INSERTION  05/19/2020  . LAPAROSCOPIC INGUINAL HERNIA REPAIR Left 12/20/1995  . LUMBAR DISC SURGERY    .   NERVE REPAIR     back of neck  . retinal laser surgery Left 09/20/1986  . SEPTOPLASTY  11/19/98  . TOTAL KNEE ARTHROPLASTY Left 12/13/2016   Procedure: TOTAL KNEE ARTHROPLASTY WITH RIGHT KNEE CORTISONE INJECTION;  Surgeon: Frederik Pear, MD;  Location: [facility];  Service: Orthopedics;  Laterality: Left;  . uric acid 6.9  12/22/04  . x-ray l great toe  07/21/00  quency Provider Last Rate Last Admin  . 0.9 %  sodium chloride infusion   Intravenous Continuous [name] IV, MD 20 mL/hr at 05/29/20 0400 Rate Verify at 05/29/20 0400  . allopurinol (ZYLOPRIM) tablet 300 mg  300 mg Oral Daily  [name] R, NP   300 mg at 05/29/20 1038  . aspirin tablet 325 mg  325 mg Oral Q4H PRN Maryanna Shape, NP      . Chlorhexidine Gluconate Cloth 2 % PADS 6 each  6 each Topical Daily Maryanna Shape, NP   6 each at 05/29/20 1038  . Cold Pack 1 packet  1 packet Topical Once PRN [name] IV, MD      . DOXOrubicin (ADRIAMYCIN) 20 mg, etoposide (VEPESID) 96 mg, vinCRIStine (ONCOVIN) 0.8 mg in sodium chloride 0.9 % 1,000 mL chemo infusion   Intravenous Once Orson Slick, MD 51 mL/hr at 05/28/20 1422 New Bag at 05/28/20 1422  . DOXOrubicin (ADRIAMYCIN) 20 mg, etoposide (VEPESID) 96 mg, vinCRIStine (ONCOVIN) 0.8 mg in sodium chloride 0.9 % 1,000 mL chemo infusion   Intravenous Once Narda Rutherford T IV, MD      . enoxaparin (LOVENOX) injection 40 mg  40 mg Subcutaneous Q24H [name] R, NP   40 mg at 05/28/20 1116  . Hot Pack 1 packet  1 packet Topical Once PRN [name] IV, MD      . hydrocortisone (ANUSOL-HC) 2.5 % rectal cream 1 application  1 application Rectal Daily PRN Maryanna Shape, NP      . losartan (COZAAR) tablet 25 mg  25 mg Oral Daily [name] R, NP   25 mg at 05/29/20 1038  . multivitamin with minerals tablet 1 tablet  1 tablet Oral Daily Maryanna Shape, NP   1 tablet at 05/29/20 1038  . ondansetron (ZOFRAN-ODT) disintegrating tablet 8 mg  8 mg Oral Q8H PRN Curcio, Kristin R, NP      . pantoprazole (PROTONIX) EC tablet 40 mg  40 mg Oral BID [name] R, NP   40 mg at 05/29/20 1038  . polyethylene glycol (MIRALAX / GLYCOLAX) packet 17 g  17 g Oral Daily PRN Narda Rutherford T IV, MD      . predniSONE (DELTASONE) tablet 120 mg  120 mg Oral Q breakfast [name] IV, MD   120 mg at 05/29/20 0819  . prochlorperazine (COMPAZINE) tablet 10 mg  10 mg Oral Q6H PRN [name] R, NP      . rosuvastatin (CRESTOR) tablet 5 mg  5 mg Oral QPM Curcio, Kristin R, NP   5 mg at 05/28/20 1744  . senna-docusate (Senokot-S) tablet 2 tablet  2 tablet Oral BID  Orson Slick, MD   2 tablet at 05/29/20 1038  . sodium bicarbonate/sodium chloride mouthwash 1050m   Mouth Rinse PRN Curcio, Kristin R, NP      . sodium chloride flush (NS) 0.9 % injection 10-40 mL  10-40 mL Intracatheter Q12H CMaryanna Shape NP   10 mL at 05/28/20 2129  .  sodium chloride flush (NS) 0.9 % injection 10-40 mL  10-40 mL Intracatheter PRN Curcio, Kristin R, NP      . sucralfate (CARAFATE) 1 GM/10ML suspension 1 g  1 g Oral TID WC & HS Curcio, Roselie Awkward, NP   1 g at 05/29/20 3762       Family History  Problem Relation Age of Onset  . Liver cancer Sister   . Heart failure Brother 44       CABGx4  . Heart attack Mother 89  . Uterine cancer Mother   . Heart attack Father 57  . Asthma Son   . Diabetes Brother   . Prostate cancer Brother   Relation Age of Onset  . Liver cancer Sister   . Heart failure Brother 44       CABGx4  . Heart attack Mother 45  . Uterine cancer Mother   . Heart attack Father 66  . Asthma Son   . Diabetes Brother   . Prostate cancer Brother      Socioeconomic History  . Marital status: Married    Spouse name: [name]  . Number of children: 2  . Years of education: 26  . Highest education level: Not on file  Occupational History  . Occupation: Systems developer: [employer]  Tobacco Use  . Smoking status: Never Smoker  . Smokeless tobacco: Never Used  Vaping Use  . Vaping Use: Never used  Substance and Sexual Activity  . Alcohol use: No  . Drug use: No  . Sexual activity: Not on file  Other Topics Concern  . Not on file  Social History Narrative   Married to [employer] at [employer].   Son, [name] married   Son, [name], Married.    Caffeine use: 1 cup coffee per day   Social Determinants of Health   Financial Resource Strain:   . Difficulty of Paying Living Expenses: Not on file  Food Insecurity:   . Worried About Charity fundraiser in the Last Year: Not on file  . Ran Out of Food in  the Last Year: Not on file  Transportation Needs:   . Lack of Transportation (Medical): Not on file  . Lack of Transportation (Non-Medical): Not on file  Physical Activity:   . Days of Exercise per Week: Not on file  . Minutes of Exercise per Session: Not on file  Stress:   . Feeling of Stress : Not on file  Social Connections:   . Frequency of Communication with Friends and Family: Not on file  . Frequency of Social Gatherings with Friends and Family: Not on file  . Attends Religious Services: Not on file  . Active Member of Clubs or Organizations: Not on file  . Attends Archivist Meetings: Not on file  . Marital Status: Not on file  Intimate Partner Violence:   . Fear of Current or Ex-Partner: Not on file  . Emotionally Abused: Not on file  . Physically Abused: Not on file  . Sexually Abused: Not on file  :  Review of Systems: A comprehensive 14 point review of systems was negative except as noted in the HPI.  Exam: Patient Vitals for the past 24 hrs: BP (!) 118/58 (BP Location: Left Arm)   Pulse (!) 54   Temp (!) 97.3 F (36.3 C) (Oral)   Resp 16   Ht 5' 8" (1.727 m)   Wt 164 lb (  74.4 kg)   SpO2 100%   BMI 24.94 kg/m    General:  well-nourished in no acute distress.   Eyes:  no scleral icterus.   ENT:  There were no oropharyngeal lesions.   Neck was without thyromegaly.   Lymphatics:  Negative cervical, supraclavicular or axillary adenopathy.   Respiratory: lungs were clear bilaterally without wheezing or crackles.   Cardiovascular:  Regular rate and rhythm, S1/S2, without murmur, rub or gallop.  There was no pedal edema.   GI:  Positive bowel sounds.  Left upper quadrant mass not palpable. Musculoskeletal:  no spinal tenderness of palpation of vertebral spine.   Skin exam was without echymosis, petichae.   Neuro exam was nonfocal. Patient was alert and oriented.  Attention was good.   Language was appropriate.  Mood was normal without depression.   Speech was not pressured.  Thought content was not tangential.    CBC    Component Value Date/Time   WBC 13.4 (H) 09/10/2020 0546   RBC 2.64 (L) 09/10/2020 0546   HGB 7.8 (L) 09/10/2020 0546   HGB 9.1 (L) 08/25/2020 1127   HCT 25.7 (L) 09/10/2020 0546   PLT 283 09/10/2020 0546   PLT 278 08/25/2020 1127   MCV 97.3 09/10/2020 0546   MCH 29.5 09/10/2020 0546   MCHC 30.4 09/10/2020 0546   RDW 20.2 (H) 09/10/2020 0546   LYMPHSABS 0.2 (L) 09/10/2020 0546   MONOABS 0.7 09/10/2020 0546   EOSABS 0.0 09/10/2020 0546   BASOSABS 0.0 09/10/2020 0546   CMP Latest Ref Rng & Units 09/10/2020 09/09/2020 09/08/2020  Glucose 70 - 99 mg/dL 114(H) 133(H) 151(H)  BUN 8 - 23 mg/dL 30(H) 27(H) 22  Creatinine 0.61 - 1.24 mg/dL 1.00 1.09 1.16  Sodium 135 - 145 mmol/L 143 142 140  Potassium 3.5 - 5.1 mmol/L 4.1 4.2 4.1  Chloride 98 - 111 mmol/L 110 108 105  CO2 22 - 32 mmol/L 24 24 24  Calcium 8.9 - 10.3 mg/dL 8.8(L) 9.1 9.2  Total Protein 6.5 - 8.1 g/dL 5.3(L) 5.6(L) 6.4(L)  Total Bilirubin 0.3 - 1.2 mg/dL 0.4 0.4 0.4  Alkaline Phos 38 - 126 U/L 51 60 73  AST 15 - 41 U/L 18 19 29  ALT 0 - 44 U/L 21 23 27   ECHOCARDIOGRAM COMPLETE  Result Date: 05/22/2020    ECHOCARDIOGRAM REPORT   Patient Name:   Connor Adams Date of Exam: 05/22/2020 Medical Rec #:  [id number]        Height:       68.0 in Accession #:    [id number]       Weight:       171.0 lb Date of Birth:  12/28/1945        BSA:          1.912 m Patient Age:    73 years         BP:           116/77 mmHg Patient Gender: M                HR:           80 bpm. Exam Location:  Outpatient Procedure: 2D Echo, Cardiac Doppler and Color Doppler Indications:    Chemotherapy evaluation v87.41 / v58.11  History:        Patient has no prior history of Echocardiogram examinations. TIA                 and Stroke;   Risk Factors:Hypertension, Dyslipidemia and GERD.  Sonographer:    Sarah Pirrotta RDCS Referring Phys: [id number] JOHN T DORSEY IV IMPRESSIONS  1. Left  ventricular ejection fraction, by estimation, is 60 to 65%. The left ventricle has normal function. The left ventricle has no regional wall motion abnormalities. Left ventricular diastolic parameters are consistent with Grade I diastolic dysfunction (impaired relaxation). The average left ventricular global longitudinal strain is -17.6 %. The global longitudinal strain is normal.  2. Right ventricular systolic function is normal. The right ventricular size is normal.  3. The mitral valve is normal in structure. No evidence of mitral valve regurgitation. No evidence of mitral stenosis.  4. The aortic valve is normal in structure. Aortic valve regurgitation is not visualized. No aortic stenosis is present.  5. The inferior vena cava is normal in size with greater than 50% respiratory variability, suggesting right atrial pressure of 3 mmHg. FINDINGS  Left Ventricle: Left ventricular ejection fraction, by estimation, is 60 to 65%. The left ventricle has normal function. The left ventricle has no regional wall motion abnormalities. The average left ventricular global longitudinal strain is -17.6 %. The global longitudinal strain is normal. The left ventricular internal cavity size was normal in size. There is no left ventricular hypertrophy. Left ventricular diastolic parameters are consistent with Grade I diastolic dysfunction (impaired relaxation). Right Ventricle: The right ventricular size is normal. No increase in right ventricular wall thickness. Right ventricular systolic function is normal. Left Atrium: Left atrial size was normal in size. Right Atrium: Right atrial size was normal in size. Pericardium: There is no evidence of pericardial effusion. Mitral Valve: The mitral valve is normal in structure. Normal mobility of the mitral valve leaflets. No evidence of mitral valve regurgitation. No evidence of mitral valve stenosis. Tricuspid Valve: The tricuspid valve is normal in structure. Tricuspid valve  regurgitation is not demonstrated. No evidence of tricuspid stenosis. Aortic Valve: The aortic valve is normal in structure. Aortic valve regurgitation is not visualized. No aortic stenosis is present. Pulmonic Valve: The pulmonic valve was normal in structure. Pulmonic valve regurgitation is trivial. No evidence of pulmonic stenosis. Aorta: The aortic root is normal in size and structure. Venous: The inferior vena cava is normal in size with greater than 50% respiratory variability, suggesting right atrial pressure of 3 mmHg. IAS/Shunts: No atrial level shunt detected by color flow Doppler.  LEFT VENTRICLE PLAX 2D LVIDd:         5.10 cm  Diastology LVIDs:         3.20 cm  LV e' lateral:   9.94 cm/s LV PW:         0.70 cm  LV E/e' lateral: 4.6 LV IVS:        0.70 cm  LV e' medial:    6.85 cm/s LVOT diam:     2.10 cm  LV E/e' medial:  6.6 LV SV:         81 LV SV Index:   43       2D Longitudinal Strain LVOT Area:     3.46 cm 2D Strain GLS Avg:     -17.6 %  RIGHT VENTRICLE RV S prime:     9.79 cm/s TAPSE (M-mode): 1.6 cm LEFT ATRIUM             Index       RIGHT ATRIUM           Index LA diam:        3.70 cm 1.94   cm/m  RA Area:     14.40 cm LA Vol (A2C):   69.5 ml 36.35 ml/m RA Volume:   31.90 ml  16.68 ml/m LA Vol (A4C):   51.0 ml 26.67 ml/m LA Biplane Vol: 59.4 ml 31.07 ml/m  AORTIC VALVE LVOT Vmax:   143.00 cm/s LVOT Vmean:  98.900 cm/s LVOT VTI:    0.235 m  AORTA Ao Root diam: 3.20 cm Ao Asc diam:  3.40 cm MITRAL VALVE MV Area (PHT): 2.22 cm    SHUNTS MV Decel Time: 342 msec    Systemic VTI:  0.24 m MV E velocity: 45.30 cm/s  Systemic Diam: 2.10 cm MV A velocity: 72.50 cm/s MV E/A ratio:  0.62 Candee Furbish MD Electronically signed by Candee Furbish MD Signature Date/Time: 05/22/2020/12:55:40 PM    Final    IR IMAGING GUIDED PORT INSERTION  Result Date: 05/19/2020 INDICATION: History of diffuse large B-cell lymphoma. In need of durable intravenous access for chemotherapy administration EXAM: IMPLANTED PORT A  CATH PLACEMENT WITH ULTRASOUND AND FLUOROSCOPIC GUIDANCE COMPARISON:  PET-CT-04/28/2020 MEDICATIONS: Ancef 2 gm IV; The antibiotic was administered within an appropriate time interval prior to skin puncture. ANESTHESIA/SEDATION: Moderate (conscious) sedation was employed during this procedure. A total of Versed 2 mg and Fentanyl 100 mcg was administered intravenously. Moderate Sedation Time: 25 minutes. The patient's level of consciousness and vital signs were monitored continuously by radiology nursing throughout the procedure under my direct supervision. CONTRAST:  None FLUOROSCOPY TIME:  24 seconds (5 mGy) COMPLICATIONS: None immediate. PROCEDURE: The procedure, risks, benefits, and alternatives were explained to the patient. Questions regarding the procedure were encouraged and answered. The patient understands and consents to the procedure. The right neck and chest were prepped with chlorhexidine in a sterile fashion, and a sterile drape was applied covering the operative field. Maximum barrier sterile technique with sterile gowns and gloves were used for the procedure. A timeout was performed prior to the initiation of the procedure. Local anesthesia was provided with 1% lidocaine with epinephrine. After creating a small venotomy incision, a micropuncture kit was utilized to access the internal jugular vein. Real-time ultrasound guidance was utilized for vascular access including the acquisition of a permanent ultrasound image documenting patency of the accessed vessel. The microwire was utilized to measure appropriate catheter length. A subcutaneous port pocket was then created along the upper chest wall utilizing a combination of sharp and blunt dissection. The pocket was irrigated with sterile saline. A single lumen "Slim" sized power injectable port was chosen for placement. The 8 Fr catheter was tunneled from the port pocket site to the venotomy incision. The port was placed in the pocket. The external  catheter was trimmed to appropriate length. At the venotomy, an 8 Fr peel-away sheath was placed over a guidewire under fluoroscopic guidance. The catheter was then placed through the sheath and the sheath was removed. Final catheter positioning was confirmed and documented with a fluoroscopic spot radiograph. The port was accessed with a Huber needle, aspirated and flushed with heparinized saline. The venotomy site was closed with an interrupted 4-0 Vicryl suture. The port pocket incision was closed with interrupted 2-0 Vicryl suture. The skin was opposed with a running subcuticular 4-0 Vicryl suture. Dermabond and Steri-strips were applied to both incisions. Dressings were applied. The patient tolerated the procedure well without immediate post procedural complication. FINDINGS: After catheter placement, the tip lies within the superior cavoatrial junction. The catheter aspirates and flushes normally and is ready for immediate use. IMPRESSION: Successful  placement of a right internal jugular approach power injectable Port-A-Cath. The catheter is ready for immediate use. Electronically Signed   By: John  Watts M.D.   On: 05/19/2020 15:59     CT Biopsy  Result Date: 05/08/2020 CLINICAL DATA:  13 cm left-sided mesenteric abdominal mass. EXAM: CT GUIDED CORE BIOPSY OF MESENTERIC MASS ANESTHESIA/SEDATION: 1.5 mg IV Versed; 75 mcg IV Fentanyl Total Moderate Sedation Time:  18 minutes. The patient's level of consciousness and physiologic status were continuously monitored during the procedure by Radiology nursing. PROCEDURE: The procedure risks, benefits, and alternatives were explained to the patient. Questions regarding the procedure were encouraged and answered. The patient understands and consents to the procedure. A time-out was performed prior to initiating the procedure. CT was performed through the abdomen in a supine position. The left abdominal wall was prepped with chlorhexidine in a sterile fashion, and  a sterile drape was applied covering the operative field. A sterile gown and sterile gloves were used for the procedure. Local anesthesia was provided with 1% Lidocaine. Under CT guidance, a 17 gauge trocar needle was advanced to the level of a left-sided mesenteric mass. After confirming needle tip position, 4 separate coaxial 18 gauge core biopsy samples were obtained and submitted in saline. Some Gel-Foam pledgets were advanced through the outer needle as the needle was retracted and removed. COMPLICATIONS: None FINDINGS: Large left-sided intra-abdominal mass within the mesentery measures up to 13.3 cm in maximum diameter. Solid tissue was obtained. IMPRESSION: CT-guided core biopsy performed of large left-sided mesenteric mass measuring just over 13 cm in estimated maximal diameter. Electronically Signed   By: Glenn  Yamagata M.D.   On: 05/08/2020 15:02   ECHOCARDIOGRAM COMPLETE  Result Date: 05/22/2020    ECHOCARDIOGRAM REPORT   Patient Name:   Connor Adams Date of Exam: 05/22/2020 Medical Rec #:  [id number]        Height:       68.0 in Accession #:    [id number]       Weight:       171.0 lb Date of Birth:  02/28/1946        BSA:          1.912 m Patient Age:    73 years         BP:           116/77 mmHg Patient Gender: M                HR:           80 bpm. Exam Location:  Outpatient Procedure: 2D Echo, Cardiac Doppler and Color Doppler Indications:    Chemotherapy evaluation v87.41 / v58.11  History:        Patient has no prior history of Echocardiogram examinations. TIA                 and Stroke; Risk Factors:Hypertension, Dyslipidemia and GERD.  Sonographer:    Sarah Pirrotta RDCS Referring Phys: [id number] JOHN T DORSEY IV IMPRESSIONS  1. Left ventricular ejection fraction, by estimation, is 60 to 65%. The left ventricle has normal function. The left ventricle has no regional wall motion abnormalities. Left ventricular diastolic parameters are consistent with Grade I diastolic dysfunction (impaired  relaxation). The average left ventricular global longitudinal strain is -17.6 %. The global longitudinal strain is normal.  2. Right ventricular systolic function is normal. The right ventricular size is normal.  3. The mitral valve is normal in structure. No evidence of mitral   valve regurgitation. No evidence of mitral stenosis.  4. The aortic valve is normal in structure. Aortic valve regurgitation is not visualized. No aortic stenosis is present.  5. The inferior vena cava is normal in size with greater than 50% respiratory variability, suggesting right atrial pressure of 3 mmHg. FINDINGS  Left Ventricle: Left ventricular ejection fraction, by estimation, is 60 to 65%. The left ventricle has normal function. The left ventricle has no regional wall motion abnormalities. The average left ventricular global longitudinal strain is -17.6 %. The global longitudinal strain is normal. The left ventricular internal cavity size was normal in size. There is no left ventricular hypertrophy. Left ventricular diastolic parameters are consistent with Grade I diastolic dysfunction (impaired relaxation). Right Ventricle: The right ventricular size is normal. No increase in right ventricular wall thickness. Right ventricular systolic function is normal. Left Atrium: Left atrial size was normal in size. Right Atrium: Right atrial size was normal in size. Pericardium: There is no evidence of pericardial effusion. Mitral Valve: The mitral valve is normal in structure. Normal mobility of the mitral valve leaflets. No evidence of mitral valve regurgitation. No evidence of mitral valve stenosis. Tricuspid Valve: The tricuspid valve is normal in structure. Tricuspid valve regurgitation is not demonstrated. No evidence of tricuspid stenosis. Aortic Valve: The aortic valve is normal in structure. Aortic valve regurgitation is not visualized. No aortic stenosis is present. Pulmonic Valve: The pulmonic valve was normal in structure. Pulmonic  valve regurgitation is trivial. No evidence of pulmonic stenosis. Aorta: The aortic root is normal in size and structure. Venous: The inferior vena cava is normal in size with greater than 50% respiratory variability, suggesting right atrial pressure of 3 mmHg. IAS/Shunts: No atrial level shunt detected by color flow Doppler.  LEFT VENTRICLE PLAX 2D LVIDd:         5.10 cm  Diastology LVIDs:         3.20 cm  LV e' lateral:   9.94 cm/s LV PW:         0.70 cm  LV E/e' lateral: 4.6 LV IVS:        0.70 cm  LV e' medial:    6.85 cm/s LVOT diam:     2.10 cm  LV E/e' medial:  6.6 LV SV:         81 LV SV Index:   43       2D Longitudinal Strain LVOT Area:     3.46 cm 2D Strain GLS Avg:     -17.6 %  RIGHT VENTRICLE RV S prime:     9.79 cm/s TAPSE (M-mode): 1.6 cm LEFT ATRIUM             Index       RIGHT ATRIUM           Index LA diam:        3.70 cm 1.94 cm/m  RA Area:     14.40 cm LA Vol (A2C):   69.5 ml 36.35 ml/m RA Volume:   31.90 ml  16.68 ml/m LA Vol (A4C):   51.0 ml 26.67 ml/m LA Biplane Vol: 59.4 ml 31.07 ml/m  AORTIC VALVE LVOT Vmax:   143.00 cm/s LVOT Vmean:  98.900 cm/s LVOT VTI:    0.235 m  AORTA Ao Root diam: 3.20 cm Ao Asc diam:  3.40 cm MITRAL VALVE MV Area (PHT): 2.22 cm    SHUNTS MV Decel Time: 342 msec    Systemic VTI:  0.24 m MV E velocity:   45.30 cm/s  Systemic Diam: 2.10 cm MV A velocity: 72.50 cm/s MV E/A ratio:  0.62 Mark Skains MD Electronically signed by Mark Skains MD Signature Date/Time: 05/22/2020/12:55:40 PM    Final    IR IMAGING GUIDED PORT INSERTION  Result Date: 05/19/2020 INDICATION: History of diffuse large B-cell lymphoma. In need of durable intravenous access for chemotherapy administration EXAM: IMPLANTED PORT A CATH PLACEMENT WITH ULTRASOUND AND FLUOROSCOPIC GUIDANCE COMPARISON:  PET-CT-04/28/2020 MEDICATIONS: Ancef 2 gm IV; The antibiotic was administered within an appropriate time interval prior to skin puncture. ANESTHESIA/SEDATION: Moderate (conscious) sedation was employed  during this procedure. A total of Versed 2 mg and Fentanyl 100 mcg was administered intravenously. Moderate Sedation Time: 25 minutes. The patient's level of consciousness and vital signs were monitored continuously by radiology nursing throughout the procedure under my direct supervision. CONTRAST:  None FLUOROSCOPY TIME:  24 seconds (5 mGy) COMPLICATIONS: None immediate. PROCEDURE: The procedure, risks, benefits, and alternatives were explained to the patient. Questions regarding the procedure were encouraged and answered. The patient understands and consents to the procedure. The right neck and chest were prepped with chlorhexidine in a sterile fashion, and a sterile drape was applied covering the operative field. Maximum barrier sterile technique with sterile gowns and gloves were used for the procedure. A timeout was performed prior to the initiation of the procedure. Local anesthesia was provided with 1% lidocaine with epinephrine. After creating a small venotomy incision, a micropuncture kit was utilized to access the internal jugular vein. Real-time ultrasound guidance was utilized for vascular access including the acquisition of a permanent ultrasound image documenting patency of the accessed vessel. The microwire was utilized to measure appropriate catheter length. A subcutaneous port pocket was then created along the upper chest wall utilizing a combination of sharp and blunt dissection. The pocket was irrigated with sterile saline. A single lumen "Slim" sized power injectable port was chosen for placement. The 8 Fr catheter was tunneled from the port pocket site to the venotomy incision. The port was placed in the pocket. The external catheter was trimmed to appropriate length. At the venotomy, an 8 Fr peel-away sheath was placed over a guidewire under fluoroscopic guidance. The catheter was then placed through the sheath and the sheath was removed. Final catheter positioning was confirmed and documented  with a fluoroscopic spot radiograph. The port was accessed with a Huber needle, aspirated and flushed with heparinized saline. The venotomy site was closed with an interrupted 4-0 Vicryl suture. The port pocket incision was closed with interrupted 2-0 Vicryl suture. The skin was opposed with a running subcuticular 4-0 Vicryl suture. Dermabond and Steri-strips were applied to both incisions. Dressings were applied. The patient tolerated the procedure well without immediate post procedural complication. FINDINGS: After catheter placement, the tip lies within the superior cavoatrial junction. The catheter aspirates and flushes normally and is ready for immediate use. IMPRESSION: Successful placement of a right internal jugular approach power injectable Port-A-Cath. The catheter is ready for immediate use. Electronically Signed   By: John  Watts M.D.   On: 05/19/2020 15:59    Assessment and Plan:  Connor Adams 74 y.o. male with medical history significant for Stage I DLBCL who presents for a follow up visit.  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    He is currently receiving cycle #6 of EPOCH-R starting 09/08/2020.  Today he is Cycle 6 Day 3 of treatment.  He is tolerating his chemotherapy well so far with no nausea or vomiting. Small BM today, increased miralax to BID to help precipitate.  There are no barriers to proceeding with treatment.      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 Stage I bulky disease.   --Continue EPOCH-R. Today is Cycle 6 Day 3 --Adverse effects have been reviewed including, but not limited to,  alopecia, myelosuppression, peripheral neuropathy, mucositis, liver/renal dysfunction, and possible infusion reaction associated with Rituxan. He agrees to proceed. --PAC in place and ready for use. --Echocardiogram from 05/22/2020 shows LVEF of 60-65%  --CBC adequate to proceed with treatment today.  Leukocytosis likely related to steroids.  Anemia slightly worse this morning.  He is asymptomatic.  Discussed with patient that he may need PRBC transfusion this admission, particularly if he is <8.0 on day of discharge.   --Will monitor closely with CBC with diff, CMET, LDH, and uric acid --Lovenox for DVT prophylaxis. --Continue home medications for HTN and hyperlipidemia.  --Continue PRN antiemetics. --Continue MiraLAX and Senokot.  #Pulmonary nodules of unclear etiology --PET scan from 08/15/2020 showed new bilateral pulmonary nodules --Etiology unclear --Appreciate pulmonology consult.  They recommend repeat CT scan of the chest in approximately 6 weeks to reevaluate.  The patient is scheduled for labs, follow-up visit, Rituxan, and Neulasta on Monday, 09/15/2020.  John T. Dorsey, MD Department of Hematology/Oncology [facility] Phone: [phone] Pager: [phone] Email: [email or web address]       

## 2020-09-10 NOTE — Consult Note (Signed)
   [facility] Inpatient Consult   09/10/2020  AMOND SPERANZA 1946/06/22 [id number]  Patient chart has been reviewed for readmissions less than 30 days and for high risk score for unplanned readmissions.  Patient assessed for community [facility] follow up needs.  No identifiable needs for [facility] CM care coordination services.  Netta Cedars, MSN, [facility] Liaison Nurse Mobile Phone [phone]  Toll free office [phone]

## 2020-09-11 LAB — CBC WITH DIFFERENTIAL/PLATELET
Abs Immature Granulocytes: 0.11 10*3/uL — ABNORMAL HIGH (ref 0.00–0.07)
Basophils Absolute: 0 10*3/uL (ref 0.0–0.1)
Basophils Relative: 0 %
Eosinophils Absolute: 0 10*3/uL (ref 0.0–0.5)
Eosinophils Relative: 0 %
HCT: 25.5 % — ABNORMAL LOW (ref 39.0–52.0)
Hemoglobin: 7.9 g/dL — ABNORMAL LOW (ref 13.0–17.0)
Immature Granulocytes: 1 %
Lymphocytes Relative: 2 %
Lymphs Abs: 0.2 10*3/uL — ABNORMAL LOW (ref 0.7–4.0)
MCH: 30 pg (ref 26.0–34.0)
MCHC: 31 g/dL (ref 30.0–36.0)
MCV: 97 fL (ref 80.0–100.0)
Monocytes Absolute: 0.4 10*3/uL (ref 0.1–1.0)
Monocytes Relative: 4 %
Neutro Abs: 9.7 10*3/uL — ABNORMAL HIGH (ref 1.7–7.7)
Neutrophils Relative %: 93 %
Platelets: 264 10*3/uL (ref 150–400)
RBC: 2.63 MIL/uL — ABNORMAL LOW (ref 4.22–5.81)
RDW: 20.2 % — ABNORMAL HIGH (ref 11.5–15.5)
WBC: 10.4 10*3/uL (ref 4.0–10.5)
nRBC: 0 % (ref 0.0–0.2)

## 2020-09-11 LAB — COMPREHENSIVE METABOLIC PANEL
ALT: 20 U/L (ref 0–44)
AST: 18 U/L (ref 15–41)
Albumin: 3.1 g/dL — ABNORMAL LOW (ref 3.5–5.0)
Alkaline Phosphatase: 46 U/L (ref 38–126)
Anion gap: 8 (ref 5–15)
BUN: 32 mg/dL — ABNORMAL HIGH (ref 8–23)
CO2: 25 mmol/L (ref 22–32)
Calcium: 8.6 mg/dL — ABNORMAL LOW (ref 8.9–10.3)
Chloride: 110 mmol/L (ref 98–111)
Creatinine, Ser: 1.12 mg/dL (ref 0.61–1.24)
GFR, Estimated: >60 mL/min (ref >60)
Glucose, Bld: 95 mg/dL (ref 70–99)
Potassium: 3.8 mmol/L (ref 3.5–5.1)
Sodium: 143 mmol/L (ref 135–145)
Total Bilirubin: 0.3 mg/dL (ref 0.3–1.2)
Total Protein: 5.1 g/dL — ABNORMAL LOW (ref 6.5–8.1)

## 2020-09-11 LAB — LACTATE DEHYDROGENASE: LDH: 121 U/L (ref 98–192)

## 2020-09-11 LAB — URIC ACID: Uric Acid, Serum: 5.9 mg/dL (ref 3.7–8.6)

## 2020-09-11 MED ORDER — SODIUM CHLORIDE 0.9 % IV SOLN
900.0000 mg/m2 | Freq: Once | INTRAVENOUS | Status: AC
  Administered 2020-09-12: 1740 mg via INTRAVENOUS
  Filled 2020-09-11: qty 87

## 2020-09-11 MED ORDER — SODIUM CHLORIDE 0.9 % IV SOLN
Freq: Once | INTRAVENOUS | Status: AC
  Administered 2020-09-12: 8 mg via INTRAVENOUS
  Filled 2020-09-11: qty 8

## 2020-09-11 NOTE — Care Management Important Message (Signed)
Important Message  Patient Details IM Letter given to the Patient. Name: DEMARCUS THIELKE MRN: [id number] Date of Birth: May 30, 1946   Medicare Important Message Given:  Yes     [name] 09/11/2020, 11:21 AM

## 2020-09-11 NOTE — Progress Notes (Signed)
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   1) 04/17/2020: CT A/P showed dominant nodal mass(11.5 x 10.9 cm)within the jejunal mesentery, highly suspicious for lymphoma. Additionally there are left lower lobe pleural-based pulmonary nodules 2) 04/24/2020: establish care with Connor Adams 3) 04/28/2020:  PET CT scan shows Large solid left mesenteric mass 13.0 cm with maximum SUV of 21.2, [place] 5 with local hypermetabolic porta hepatis and retroperitoneal lymph nodes. Constitutes bulky Stage I disease.   Reason for Admission: double hit lymphoma, Cycle #6 EPOCH-R  HPI: Connor Adams is a 74 year old male with medical history significant for double hit DLBCL. He presents for Cycle 6 of R-EPOCH chemotherapy. Risks and benefits have been discussed and he has agreed to proceed  Interval History: The patient tolerated day three of his chemotherapy well overall. Denies mucositis, nausea, vomiting.  Bowels moved well last evening. Denies fevers, chills, cough, shortness of breath.  He continues to maintain a good energy level and is ambulating in the hallway multiple times per day without any difficulty.    Diagnosis Date  . Arthritis   . Chronic kidney disease (CKD), stage III (moderate)   . COVID-19   . GERD (gastroesophageal reflux disease)    occ  . Hemorrhoids   . History of ETT 3/05   low risk  . History of hiatal hernia   . History of kidney stones   . HLD (hyperlipidemia)   . HTN (hypertension)   . Left inguinal hernia   . Melanoma ([place])    excied  . 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 Stroke [facility]) 2011   tia   . TIA (transient ischemic attack) 4/11   associated w slurred speecha ndmild facial droop. lasted for about 10  minutes. Had MRI, etc with [facility] that per his report was ok (dr. Stephannie Li). Echo (4/11): EF 55-60%, no regional WMAs, normal diastolic function, mild LAE, no source of embolus  Past Surgical History:  Procedure Laterality Date  . BMI 30.2 muscular  12/08/04  . curretage actinic keratosis R ear  01/20/05  . ETT low prob of CAD  11/21/03  . excision melanoma in situ back  01/20/05  . hemorrhoids sclerosed at colonoscopy  12/19/05  . IR IMAGING GUIDED PORT INSERTION  05/19/2020  . LAPAROSCOPIC INGUINAL HERNIA REPAIR Left 12/20/1995  . LUMBAR DISC SURGERY    . NERVE REPAIR     back of neck  . retinal laser surgery Left 09/20/1986  . SEPTOPLASTY  11/19/98  . TOTAL KNEE ARTHROPLASTY Left 12/13/2016   Procedure: TOTAL KNEE ARTHROPLASTY WITH RIGHT KNEE CORTISONE INJECTION;  Surgeon: Connor Adams;  Location: [facility];  Service: Orthopedics;  Laterality: Left;  . uric acid 6.9  12/22/04  . x-ray l great toe  07/21/00       Fre Past Surgical History:  Procedure Laterality Date  . BMI 30.2 muscular  12/08/04  . curretage actinic keratosis R ear  01/20/05  . ETT low prob of CAD  11/21/03  . excision melanoma in situ back  01/20/05  . hemorrhoids sclerosed at colonoscopy  12/19/05  . IR IMAGING GUIDED PORT INSERTION  05/19/2020  . LAPAROSCOPIC INGUINAL HERNIA REPAIR Left 12/20/1995  . LUMBAR DISC SURGERY    . NERVE  REPAIR     back of neck  . retinal laser surgery Left 09/20/1986  . SEPTOPLASTY  11/19/98  . TOTAL KNEE ARTHROPLASTY Left 12/13/2016   Procedure: TOTAL KNEE ARTHROPLASTY WITH RIGHT KNEE CORTISONE INJECTION;  Surgeon: Connor Adams;  Location: [facility];  Service: Orthopedics;  Laterality: Left;  . uric acid 6.9  12/22/04  . x-ray l great toe  07/21/00  quency Provider Last Rate Last Admin  . 0.9 %  sodium chloride infusion   Intravenous Continuous Connor Adams 20 mL/hr at 05/29/20 0400 Rate Verify at 05/29/20 0400  . allopurinol (ZYLOPRIM) tablet 300 mg  300 mg Oral Daily Connor Adams  R, Adams   300 mg at 05/29/20 1038  . aspirin tablet 325 mg  325 mg Oral Q4H PRN Connor Adams      . Chlorhexidine Gluconate Cloth 2 % PADS 6 each  6 each Topical Daily Connor Adams   6 each at 05/29/20 1038  . Cold Pack 1 packet  1 packet Topical Once PRN Connor Adams      . DOXOrubicin (ADRIAMYCIN) 20 mg, etoposide (VEPESID) 96 mg, vinCRIStine (ONCOVIN) 0.8 mg in sodium chloride 0.9 % 1,000 mL chemo infusion   Intravenous Once Connor Adams 51 mL/hr at 05/28/20 1422 New Bag at 05/28/20 1422  . DOXOrubicin (ADRIAMYCIN) 20 mg, etoposide (VEPESID) 96 mg, vinCRIStine (ONCOVIN) 0.8 mg in sodium chloride 0.9 % 1,000 mL chemo infusion   Intravenous Once Connor Adams      . enoxaparin (LOVENOX) injection 40 mg  40 mg Subcutaneous Q24H Connor Adams   40 mg at 05/28/20 1116  . Hot Pack 1 packet  1 packet Topical Once PRN Connor Adams      . hydrocortisone (ANUSOL-HC) 2.5 % rectal cream 1 application  1 application Rectal Daily PRN Connor Adams      . losartan (COZAAR) tablet 25 mg  25 mg Oral Daily Connor Adams   25 mg at 05/29/20 1038  . multivitamin with minerals tablet 1 tablet  1 tablet Oral Daily Connor Adams   1 tablet at 05/29/20 1038  . ondansetron (ZOFRAN-ODT) disintegrating tablet 8 mg  8 mg Oral Q8H PRN Connor Adams      . pantoprazole (PROTONIX) EC tablet 40 mg  40 mg Oral BID Connor Adams   40 mg at 05/29/20 1038  . polyethylene glycol (MIRALAX / GLYCOLAX) packet 17 g  17 g Oral Daily PRN Connor Adams      . predniSONE (DELTASONE) tablet 120 mg  120 mg Oral Q breakfast Connor Adams   120 mg at 05/29/20 0819  . prochlorperazine (COMPAZINE) tablet 10 mg  10 mg Oral Q6H PRN Connor Adams      . rosuvastatin (CRESTOR) tablet 5 mg  5 mg Oral QPM Connor Adams   5 mg at 05/28/20 1744  . senna-docusate (Senokot-S) tablet 2 tablet  2 tablet Oral BID Connor Adams   2 tablet at 05/29/20 1038  . sodium bicarbonate/sodium chloride mouthwash 1021m   Mouth Rinse PRN Connor Adams      . sodium chloride flush (NS) 0.9 % injection 10-40 mL  10-40 mL Intracatheter Q12H CMaryanna Shape Adams   10 mL at 05/28/20 2129  .  sodium chloride flush (NS) 0.9 % injection 10-40 mL  10-40 mL Intracatheter PRN Dusti Tetro R, Adams      . sucralfate (CARAFATE) 1 GM/10ML suspension 1 g  1 g Oral TID WC & HS Lora Chavers, Roselie Awkward, Adams   1 g at 05/29/20 6812       Family History  Problem Relation Age of Onset  . Liver cancer Sister   . Heart failure Brother 44       CABGx4  . Heart attack Mother 64  . Uterine cancer Mother   . Heart attack Father 80  . Asthma Son   . Diabetes Brother   . Prostate cancer Brother   Relation Age of Onset  . Liver cancer Sister   . Heart failure Brother 44       CABGx4  . Heart attack Mother 37  . Uterine cancer Mother   . Heart attack Father 83  . Asthma Son   . Diabetes Brother   . Prostate cancer Brother      Socioeconomic History  . Marital status: Married    Spouse name: [name]  . Number of children: 2  . Years of education: 24  . Highest education level: Not on file  Occupational History  . Occupation: Systems developer: [employer]  Tobacco Use  . Smoking status: Never Smoker  . Smokeless tobacco: Never Used  Vaping Use  . Vaping Use: Never used  Substance and Sexual Activity  . Alcohol use: No  . Drug use: No  . Sexual activity: Not on file  Other Topics Concern  . Not on file  Social History Narrative   Married to [employer] at [employer].   Son, [name] married   Son, [name], Married.    Caffeine use: 1 cup coffee per day   Social Determinants of Health   Financial Resource Strain:   . Difficulty of Paying Living Expenses: Not on file  Food Insecurity:   . Worried About Charity fundraiser in the Last Year: Not on file  . Ran Out of Food in the Last Year:  Not on file  Transportation Needs:   . Lack of Transportation (Medical): Not on file  . Lack of Transportation (Non-Medical): Not on file  Physical Activity:   . Days of Exercise per Week: Not on file  . Minutes of Exercise per Session: Not on file  Stress:   . Feeling of Stress : Not on file  Social Connections:   . Frequency of Communication with Friends and Family: Not on file  . Frequency of Social Gatherings with Friends and Family: Not on file  . Attends Religious Services: Not on file  . Active Member of Clubs or Organizations: Not on file  . Attends Archivist Meetings: Not on file  . Marital Status: Not on file  Intimate Partner Violence:   . Fear of Current or Ex-Partner: Not on file  . Emotionally Abused: Not on file  . Physically Abused: Not on file  . Sexually Abused: Not on file  :  Review of Systems: A comprehensive 14 point review of systems was negative except as noted in the HPI.  Exam: Patient Vitals for the past 24 hrs: BP 108/68 (BP Location: Left Arm)   Pulse (!) 48   Temp 97.9 F (36.6 C) (Oral)   Resp 16   Ht _0  (1.727 m)   Wt 74.4 kg  SpO2 98%   BMI 24.94 kg/m    General:  well-nourished in no acute distress.   Eyes:  no scleral icterus.   ENT:  There were no oropharyngeal lesions.   Neck was without thyromegaly.   Lymphatics:  Negative cervical, supraclavicular or axillary adenopathy.   Respiratory: lungs were clear bilaterally without wheezing or crackles.   Cardiovascular:  Regular rate and rhythm, S1/S2, without murmur, rub or gallop.  There was no pedal edema.   GI:  Positive bowel sounds.  Left upper quadrant mass not palpable. Musculoskeletal:  no spinal tenderness of palpation of vertebral spine.   Skin exam was without echymosis, petichae.   Neuro exam was nonfocal. Patient was alert and oriented.  Attention was good.   Language was appropriate.  Mood was normal without depression.  Speech was not pressured.  Thought  content was not tangential.    CBC    Component Value Date/Time   WBC 10.4 09/11/2020 0513   RBC 2.63 (L) 09/11/2020 0513   HGB 7.9 (L) 09/11/2020 0513   HGB 9.1 (L) 08/25/2020 1127   HCT 25.5 (L) 09/11/2020 0513   PLT 264 09/11/2020 0513   PLT 278 08/25/2020 1127   MCV 97.0 09/11/2020 0513   MCH 30.0 09/11/2020 0513   MCHC 31.0 09/11/2020 0513   RDW 20.2 (H) 09/11/2020 0513   LYMPHSABS 0.2 (L) 09/11/2020 0513   MONOABS 0.4 09/11/2020 0513   EOSABS 0.0 09/11/2020 0513   BASOSABS 0.0 09/11/2020 0513   CMP Latest Ref Rng & Units 09/11/2020 09/10/2020 09/09/2020  Glucose 70 - 99 mg/dL 95 114(H) 133(H)  BUN 8 - 23 mg/dL 32(H) 30(H) 27(H)  Creatinine 0.61 - 1.24 mg/dL 1.12 1.00 1.09  Sodium 135 - 145 mmol/L 143 143 142  Potassium 3.5 - 5.1 mmol/L 3.8 4.1 4.2  Chloride 98 - 111 mmol/L 110 110 108  CO2 22 - 32 mmol/L _0 Calcium 8.9 - 10.3 mg/dL 8.6(L) 8.8(L) 9.1  Total Protein 6.5 - 8.1 g/dL 5.1(L) 5.3(L) 5.6(L)  Total Bilirubin 0.3 - 1.2 mg/dL 0.3 0.4 0.4  Alkaline Phos 38 - 126 U/L 46 51 60  AST 15 - 41 U/L _1 ALT 0 - 44 U/L _2 ECHOCARDIOGRAM COMPLETE  Result Date: 05/22/2020    ECHOCARDIOGRAM REPORT   Patient Name:   WOODSON MACHA Schmieg Date of Exam: 05/22/2020 Medical Rec #:  [id number]        Height:       68.0 in Accession #:    [id number]       Weight:       171.0 lb Date of Birth:  [birth year]        BSA:          1.912 m Patient Age:    40 years         BP:           116/77 mmHg Patient Gender: M                HR:           80 bpm. Exam Location:  Outpatient Procedure: 2D Echo, Cardiac Doppler and Color Doppler Indications:    Chemotherapy evaluation v87.41 / v58.11  History:        Patient has no prior history of Echocardiogram examinations. TIA                 and Stroke; Risk Factors:Hypertension, Dyslipidemia and GERD.  Sonographer:    Bernadene Person RDCS Referring Phys: [id number] [place]  1. Left ventricular ejection fraction, by  estimation, is 60 to 65%. The left ventricle has normal function. The left ventricle has no regional wall motion abnormalities. Left ventricular diastolic parameters are consistent with Grade I diastolic dysfunction (impaired relaxation). The average left ventricular global longitudinal strain is -17.6 %. The global longitudinal strain is normal.  2. Right ventricular systolic function is normal. The right ventricular size is normal.  3. The mitral valve is normal in structure. No evidence of mitral valve regurgitation. No evidence of mitral stenosis.  4. The aortic valve is normal in structure. Aortic valve regurgitation is not visualized. No aortic stenosis is present.  5. The inferior vena cava is normal in size with greater than 50% respiratory variability, suggesting right atrial pressure of 3 mmHg. FINDINGS  Left Ventricle: Left ventricular ejection fraction, by estimation, is 60 to 65%. The left ventricle has normal function. The left ventricle has no regional wall motion abnormalities. The average left ventricular global longitudinal strain is -17.6 %. The global longitudinal strain is normal. The left ventricular internal cavity size was normal in size. There is no left ventricular hypertrophy. Left ventricular diastolic parameters are consistent with Grade I diastolic dysfunction (impaired relaxation). Right Ventricle: The right ventricular size is normal. No increase in right ventricular wall thickness. Right ventricular systolic function is normal. Left Atrium: Left atrial size was normal in size. Right Atrium: Right atrial size was normal in size. Pericardium: There is no evidence of pericardial effusion. Mitral Valve: The mitral valve is normal in structure. Normal mobility of the mitral valve leaflets. No evidence of mitral valve regurgitation. No evidence of mitral valve stenosis. Tricuspid Valve: The tricuspid valve is normal in structure. Tricuspid valve regurgitation is not demonstrated. No  evidence of tricuspid stenosis. Aortic Valve: The aortic valve is normal in structure. Aortic valve regurgitation is not visualized. No aortic stenosis is present. Pulmonic Valve: The pulmonic valve was normal in structure. Pulmonic valve regurgitation is trivial. No evidence of pulmonic stenosis. Aorta: The aortic root is normal in size and structure. Venous: The inferior vena cava is normal in size with greater than 50% respiratory variability, suggesting right atrial pressure of 3 mmHg. IAS/Shunts: No atrial level shunt detected by color flow Doppler.  LEFT VENTRICLE PLAX 2D LVIDd:         5.10 cm  Diastology LVIDs:         3.20 cm  LV e' lateral:   9.94 cm/s LV PW:         0.70 cm  LV E/e' lateral: 4.6 LV IVS:        0.70 cm  LV e' medial:    6.85 cm/s LVOT diam:     2.10 cm  LV E/e' medial:  6.6 LV SV:         81 LV SV Index:   43       2D Longitudinal Strain LVOT Area:     3.46 cm 2D Strain GLS Avg:     -17.6 %  RIGHT VENTRICLE RV S prime:     9.79 cm/s TAPSE (M-mode): 1.6 cm LEFT ATRIUM             Index       RIGHT ATRIUM           Index LA diam:        3.70 cm 1.94 cm/m  RA Area:  14.40 cm LA Vol (A2C):   69.5 ml 36.35 ml/m RA Volume:   31.90 ml  16.68 ml/m LA Vol (A4C):   51.0 ml 26.67 ml/m LA Biplane Vol: 59.4 ml 31.07 ml/m  AORTIC VALVE LVOT Vmax:   143.00 cm/s LVOT Vmean:  98.900 cm/s LVOT VTI:    0.235 m  AORTA Ao Root diam: 3.20 cm Ao Asc diam:  3.40 cm MITRAL VALVE MV Area (PHT): 2.22 cm    SHUNTS MV Decel Time: 342 msec    Systemic VTI:  0.24 m MV E velocity: 45.30 cm/s  Systemic Diam: 2.10 cm MV A velocity: 72.50 cm/s MV E/A ratio:  0.62 Candee Furbish Adams Electronically signed by Candee Furbish Adams Signature Date/Time: 05/22/2020/12:55:40 PM    Final    IR IMAGING GUIDED PORT INSERTION  Result Date: 05/19/2020 INDICATION: History of diffuse large B-cell lymphoma. In need of durable intravenous access for chemotherapy administration EXAM: IMPLANTED PORT A CATH PLACEMENT WITH ULTRASOUND AND  FLUOROSCOPIC GUIDANCE COMPARISON:  PET-CT-04/28/2020 MEDICATIONS: Ancef 2 gm IV; The antibiotic was administered within an appropriate time interval prior to skin puncture. ANESTHESIA/SEDATION: Moderate (conscious) sedation was employed during this procedure. A total of Versed 2 mg and Fentanyl 100 mcg was administered intravenously. Moderate Sedation Time: 25 minutes. The patient's level of consciousness and vital signs were monitored continuously by radiology nursing throughout the procedure under my direct supervision. CONTRAST:  None FLUOROSCOPY TIME:  24 seconds (5 mGy) COMPLICATIONS: None immediate. PROCEDURE: The procedure, risks, benefits, and alternatives were explained to the patient. Questions regarding the procedure were encouraged and answered. The patient understands and consents to the procedure. The right neck and chest were prepped with chlorhexidine in a sterile fashion, and a sterile drape was applied covering the operative field. Maximum barrier sterile technique with sterile gowns and gloves were used for the procedure. A timeout was performed prior to the initiation of the procedure. Local anesthesia was provided with 1% lidocaine with epinephrine. After creating a small venotomy incision, a micropuncture kit was utilized to access the internal jugular vein. Real-time ultrasound guidance was utilized for vascular access including the acquisition of a permanent ultrasound image documenting patency of the accessed vessel. The microwire was utilized to measure appropriate catheter length. A subcutaneous port pocket was then created along the upper chest wall utilizing a combination of sharp and blunt dissection. The pocket was irrigated with sterile saline. A single lumen "Slim" sized power injectable port was chosen for placement. The 8 Fr catheter was tunneled from the port pocket site to the venotomy incision. The port was placed in the pocket. The external catheter was trimmed to appropriate  length. At the venotomy, an 8 Fr peel-away sheath was placed over a guidewire under fluoroscopic guidance. The catheter was then placed through the sheath and the sheath was removed. Final catheter positioning was confirmed and documented with a fluoroscopic spot radiograph. The port was accessed with a Huber needle, aspirated and flushed with heparinized saline. The venotomy site was closed with an interrupted 4-0 Vicryl suture. The port pocket incision was closed with interrupted 2-0 Vicryl suture. The skin was opposed with a running subcuticular 4-0 Vicryl suture. Dermabond and Steri-strips were applied to both incisions. Dressings were applied. The patient tolerated the procedure well without immediate post procedural complication. FINDINGS: After catheter placement, the tip lies within the superior cavoatrial junction. The catheter aspirates and flushes normally and is ready for immediate use. IMPRESSION: Successful placement of a right internal jugular approach power  injectable Port-A-Cath. The catheter is ready for immediate use. Electronically Signed   By: Sandi Mariscal M.D.   On: 05/19/2020 15:59     CT Biopsy  Result Date: 05/08/2020 CLINICAL DATA:  13 cm left-sided mesenteric abdominal mass. EXAM: CT GUIDED CORE BIOPSY OF MESENTERIC MASS ANESTHESIA/SEDATION: 1.5 mg IV Versed; 75 mcg IV Fentanyl Total Moderate Sedation Time:  18 minutes. The patient's level of consciousness and physiologic status were continuously monitored during the procedure by Radiology nursing. PROCEDURE: The procedure risks, benefits, and alternatives were explained to the patient. Questions regarding the procedure were encouraged and answered. The patient understands and consents to the procedure. A time-out was performed prior to initiating the procedure. CT was performed through the abdomen in a supine position. The left abdominal wall was prepped with chlorhexidine in a sterile fashion, and a sterile drape was applied  covering the operative field. A sterile gown and sterile gloves were used for the procedure. Local anesthesia was provided with 1% Lidocaine. Under CT guidance, a 17 gauge trocar needle was advanced to the level of a left-sided mesenteric mass. After confirming needle tip position, 4 separate coaxial 18 gauge core biopsy samples were obtained and submitted in saline. Some Gel-Foam pledgets were advanced through the outer needle as the needle was retracted and removed. COMPLICATIONS: None FINDINGS: Large left-sided intra-abdominal mass within the mesentery measures up to 13.3 cm in maximum diameter. Solid tissue was obtained. IMPRESSION: CT-guided core biopsy performed of large left-sided mesenteric mass measuring just over 13 cm in estimated maximal diameter. Electronically Signed   By: Aletta Edouard M.D.   On: 05/08/2020 15:02   ECHOCARDIOGRAM COMPLETE  Result Date: 05/22/2020    ECHOCARDIOGRAM REPORT   Patient Name:   PRAVIN PEREZPEREZ [facility] Date of Exam: 05/22/2020 Medical Rec #:  [id number]        Height:       68.0 in Accession #:    [id number]       Weight:       171.0 lb Date of Birth:  [birth year]        BSA:          1.912 m Patient Age:    57 years         BP:           116/77 mmHg Patient Gender: M                HR:           80 bpm. Exam Location:  Outpatient Procedure: 2D Echo, Cardiac Doppler and Color Doppler Indications:    Chemotherapy evaluation v87.41 / v58.11  History:        Patient has no prior history of Echocardiogram examinations. TIA                 and Stroke; Risk Factors:Hypertension, Dyslipidemia and GERD.  Sonographer:    Bernadene Person RDCS Referring Phys: [id number] [place]  1. Left ventricular ejection fraction, by estimation, is 60 to 65%. The left ventricle has normal function. The left ventricle has no regional wall motion abnormalities. Left ventricular diastolic parameters are consistent with Grade I diastolic dysfunction (impaired relaxation). The average left  ventricular global longitudinal strain is -17.6 %. The global longitudinal strain is normal.  2. Right ventricular systolic function is normal. The right ventricular size is normal.  3. The mitral valve is normal in structure. No evidence of mitral valve regurgitation. No evidence of mitral stenosis.  4. The aortic valve is normal in structure. Aortic valve regurgitation is not visualized. No aortic stenosis is present.  5. The inferior vena cava is normal in size with greater than 50% respiratory variability, suggesting right atrial pressure of 3 mmHg. FINDINGS  Left Ventricle: Left ventricular ejection fraction, by estimation, is 60 to 65%. The left ventricle has normal function. The left ventricle has no regional wall motion abnormalities. The average left ventricular global longitudinal strain is -17.6 %. The global longitudinal strain is normal. The left ventricular internal cavity size was normal in size. There is no left ventricular hypertrophy. Left ventricular diastolic parameters are consistent with Grade I diastolic dysfunction (impaired relaxation). Right Ventricle: The right ventricular size is normal. No increase in right ventricular wall thickness. Right ventricular systolic function is normal. Left Atrium: Left atrial size was normal in size. Right Atrium: Right atrial size was normal in size. Pericardium: There is no evidence of pericardial effusion. Mitral Valve: The mitral valve is normal in structure. Normal mobility of the mitral valve leaflets. No evidence of mitral valve regurgitation. No evidence of mitral valve stenosis. Tricuspid Valve: The tricuspid valve is normal in structure. Tricuspid valve regurgitation is not demonstrated. No evidence of tricuspid stenosis. Aortic Valve: The aortic valve is normal in structure. Aortic valve regurgitation is not visualized. No aortic stenosis is present. Pulmonic Valve: The pulmonic valve was normal in structure. Pulmonic valve regurgitation is  trivial. No evidence of pulmonic stenosis. Aorta: The aortic root is normal in size and structure. Venous: The inferior vena cava is normal in size with greater than 50% respiratory variability, suggesting right atrial pressure of 3 mmHg. IAS/Shunts: No atrial level shunt detected by color flow Doppler.  LEFT VENTRICLE PLAX 2D LVIDd:         5.10 cm  Diastology LVIDs:         3.20 cm  LV e' lateral:   9.94 cm/s LV PW:         0.70 cm  LV E/e' lateral: 4.6 LV IVS:        0.70 cm  LV e' medial:    6.85 cm/s LVOT diam:     2.10 cm  LV E/e' medial:  6.6 LV SV:         81 LV SV Index:   43       2D Longitudinal Strain LVOT Area:     3.46 cm 2D Strain GLS Avg:     -17.6 %  RIGHT VENTRICLE RV S prime:     9.79 cm/s TAPSE (M-mode): 1.6 cm LEFT ATRIUM             Index       RIGHT ATRIUM           Index LA diam:        3.70 cm 1.94 cm/m  RA Area:     14.40 cm LA Vol (A2C):   69.5 ml 36.35 ml/m RA Volume:   31.90 ml  16.68 ml/m LA Vol (A4C):   51.0 ml 26.67 ml/m LA Biplane Vol: 59.4 ml 31.07 ml/m  AORTIC VALVE LVOT Vmax:   143.00 cm/s LVOT Vmean:  98.900 cm/s LVOT VTI:    0.235 m  AORTA Ao Root diam: 3.20 cm Ao Asc diam:  3.40 cm MITRAL VALVE MV Area (PHT): 2.22 cm    SHUNTS MV Decel Time: 342 msec    Systemic VTI:  0.24 m MV E velocity: 45.30 cm/s  Systemic Diam: 2.10 cm MV  A velocity: 72.50 cm/s MV E/A ratio:  0.62 Candee Furbish Adams Electronically signed by Candee Furbish Adams Signature Date/Time: 05/22/2020/12:55:40 PM    Final    IR IMAGING GUIDED PORT INSERTION  Result Date: 05/19/2020 INDICATION: History of diffuse large B-cell lymphoma. In need of durable intravenous access for chemotherapy administration EXAM: IMPLANTED PORT A CATH PLACEMENT WITH ULTRASOUND AND FLUOROSCOPIC GUIDANCE COMPARISON:  PET-CT-04/28/2020 MEDICATIONS: Ancef 2 gm IV; The antibiotic was administered within an appropriate time interval prior to skin puncture. ANESTHESIA/SEDATION: Moderate (conscious) sedation was employed during this procedure.  A total of Versed 2 mg and Fentanyl 100 mcg was administered intravenously. Moderate Sedation Time: 25 minutes. The patient's level of consciousness and vital signs were monitored continuously by radiology nursing throughout the procedure under my direct supervision. CONTRAST:  None FLUOROSCOPY TIME:  24 seconds (5 mGy) COMPLICATIONS: None immediate. PROCEDURE: The procedure, risks, benefits, and alternatives were explained to the patient. Questions regarding the procedure were encouraged and answered. The patient understands and consents to the procedure. The right neck and chest were prepped with chlorhexidine in a sterile fashion, and a sterile drape was applied covering the operative field. Maximum barrier sterile technique with sterile gowns and gloves were used for the procedure. A timeout was performed prior to the initiation of the procedure. Local anesthesia was provided with 1% lidocaine with epinephrine. After creating a small venotomy incision, a micropuncture kit was utilized to access the internal jugular vein. Real-time ultrasound guidance was utilized for vascular access including the acquisition of a permanent ultrasound image documenting patency of the accessed vessel. The microwire was utilized to measure appropriate catheter length. A subcutaneous port pocket was then created along the upper chest wall utilizing a combination of sharp and blunt dissection. The pocket was irrigated with sterile saline. A single lumen "Slim" sized power injectable port was chosen for placement. The 8 Fr catheter was tunneled from the port pocket site to the venotomy incision. The port was placed in the pocket. The external catheter was trimmed to appropriate length. At the venotomy, an 8 Fr peel-away sheath was placed over a guidewire under fluoroscopic guidance. The catheter was then placed through the sheath and the sheath was removed. Final catheter positioning was confirmed and documented with a fluoroscopic  spot radiograph. The port was accessed with a Huber needle, aspirated and flushed with heparinized saline. The venotomy site was closed with an interrupted 4-0 Vicryl suture. The port pocket incision was closed with interrupted 2-0 Vicryl suture. The skin was opposed with a running subcuticular 4-0 Vicryl suture. Dermabond and Steri-strips were applied to both incisions. Dressings were applied. The patient tolerated the procedure well without immediate post procedural complication. FINDINGS: After catheter placement, the tip lies within the superior cavoatrial junction. The catheter aspirates and flushes normally and is ready for immediate use. IMPRESSION: Successful placement of a right internal jugular approach power injectable Port-A-Cath. The catheter is ready for immediate use. Electronically Signed   By: Sandi Mariscal M.D.   On: 05/19/2020 15:59    Assessment and Plan:  KODEE RAVERT 74 y.o. male with medical history significant for Stage I DLBCL who presents for a follow up visit.  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  He is currently receiving cycle #6 of  EPOCH-R starting 09/08/2020.  Today he is Cycle 6 Day 4 of treatment.  He is tolerating his chemotherapy well so far with no nausea or vomiting. Bowels moving well with current bowel regimen. There are no barriers to proceeding with treatment.      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 Stage I bulky disease.   --Continue EPOCH-R. Today is Cycle 6 Day 4 --Adverse effects have been reviewed including, but not limited to, alopecia, myelosuppression, peripheral  neuropathy, mucositis, liver/renal dysfunction, and possible infusion reaction associated with Rituxan. He agrees to proceed. --PAC in place and ready for use. --Echocardiogram from 05/22/2020 shows LVEF of 60-65%  --CBC adequate to proceed with treatment today.  Leukocytosis resolved.  Anemia stable this morning.  He is asymptomatic.  Discussed with patient that he may need PRBC transfusion this admission, particularly if he is <8.0 on day of discharge.   --Will monitor closely with CBC with diff, CMET, LDH, and uric acid --Lovenox for DVT prophylaxis. --Continue home medications for HTN and hyperlipidemia.  --Continue PRN antiemetics. --Continue MiraLAX and Senokot.  #Pulmonary nodules of unclear etiology --PET scan from 08/15/2020 showed new bilateral pulmonary nodules --Etiology unclear --Appreciate pulmonology consult.  They recommend repeat CT scan of the chest in approximately 6 weeks to reevaluate.  The patient is scheduled for labs, follow-up visit, Rituxan, and Neulasta on Monday, 09/15/2020.  Follow-up visit as scheduled for 09/24/2020.  Connor Bussing, DNP, AGPCNP-BC, AOCNP

## 2020-09-11 NOTE — Discharge Summary (Signed)
Physician Discharge Summary  Patient ID: Connor Adams MRN: [id number] DOB/AGE: March 10, 1946 74 y.o.  Admit date: 09/08/2020 Discharge date: 09/12/2020  Discharge Diagnoses:  Active Problems:   Encounter for antineoplastic chemotherapy   Diffuse large B-cell lymphoma of intra-abdominal lymph nodes ([place])  Discharged Condition: good  Discharge Labs:  None pending.   Significant Diagnostic Studies:   CBC Latest Ref Rng & Units 09/12/2020 09/11/2020 09/10/2020  WBC 4.0 - 10.5 K/uL 8.3 10.4 13.4(H)  Hemoglobin 13.0 - 17.0 g/dL 8.2(L) 7.9(L) 7.8(L)  Hematocrit 39.0 - 52.0 % 26.8(L) 25.5(L) 25.7(L)  Platelets 150 - 400 K/uL 285 264 283   CMP Latest Ref Rng & Units 09/12/2020 09/11/2020 09/10/2020  Glucose 70 - 99 mg/dL 104(H) 95 114(H)  BUN 8 - 23 mg/dL 29(H) 32(H) 30(H)  Creatinine 0.61 - 1.24 mg/dL 1.03 1.12 1.00  Sodium 135 - 145 mmol/L 143 143 143  Potassium 3.5 - 5.1 mmol/L 3.6 3.8 4.1  Chloride 98 - 111 mmol/L 108 110 110  CO2 22 - 32 mmol/L 24 25 24   Calcium 8.9 - 10.3 mg/dL 8.6(L) 8.6(L) 8.8(L)  Total Protein 6.5 - 8.1 g/dL 5.3(L) 5.1(L) 5.3(L)  Total Bilirubin 0.3 - 1.2 mg/dL 0.2(L) 0.3 0.4  Alkaline Phos 38 - 126 U/L 41 46 51  AST 15 - 41 U/L 17 18 18   ALT 0 - 44 U/L 22 20 21    Consults: None  Procedures: None  Disposition:  Discharge disposition: 01-Home or Self Care     Day of Discharge Physical Exam:  General:  well-nourished in no acute distress.   Eyes:  no scleral icterus.   Respiratory: lungs were clear bilaterally without wheezing or crackles.   Cardiovascular:  Regular rate and rhythm, S1/S2, without murmur, rub or gallop. There was no pedal edema.   GI:  Positive bowel sounds.  Left upper quadrant abdominal mass nonpalpable.   Musculoskeletal:  no spinal tenderness of palpation of vertebral spine.   Skin exam was without echymosis, petichae.   Neuro exam was nonfocal. Patient was alert and oriented.  Attention was good.  Language was  appropriate.  Mood was normal without depression.  Speech was not pressured.  Thought content was not tangential.    Allergies as of 09/12/2020   No Known Allergies     Medication List    TAKE these medications   acetaminophen 325 MG tablet Commonly known as: TYLENOL Take 2 tablets (650 mg total) by mouth every 6 (six) hours as needed for mild pain, moderate pain or fever.   feeding supplement Liqd Take 237 mLs by mouth 2 (two) times daily between meals.   fluticasone 50 MCG/ACT nasal spray Commonly known as: FLONASE Place 1-2 sprays into both nostrils daily as needed for allergies or rhinitis.   hydrocortisone 2.5 % rectal cream Commonly known as: ANUSOL-HC Place 1 application rectally daily as needed. What changed: when to take this   losartan 25 MG tablet Commonly known as: COZAAR Take 25 mg by mouth daily.   magic mouthwash w/lidocaine Soln Take 5 mLs by mouth 4 (four) times daily. Lidocaine 80 mls; benadryl liquid 80 mls; maalox 80 mls   multivitamins ther. w/minerals Tabs tablet Take 1 tablet by mouth daily.   ondansetron 8 MG disintegrating tablet Commonly known as: Zofran ODT Take 1 tablet (8 mg total) by mouth every 8 (eight) hours as needed for nausea or vomiting.   pantoprazole 40 MG tablet Commonly known as: PROTONIX Take 1 tablet (40 mg total) by mouth  2 (two) times daily.   polyethylene glycol 17 g packet Commonly known as: MIRALAX / GLYCOLAX Take 17 g by mouth daily as needed for moderate constipation.   polyvinyl alcohol 1.4 % ophthalmic solution Commonly known as: LIQUIFILM TEARS Place 1 drop into both eyes as needed for dry eyes.   prochlorperazine 10 MG tablet Commonly known as: COMPAZINE Take 1 tablet (10 mg total) by mouth every 6 (six) hours as needed for nausea or vomiting.   promethazine 25 MG tablet Commonly known as: PHENERGAN Take 1 tablet (25 mg total) by mouth every 6 (six) hours as needed for nausea or vomiting.   rosuvastatin 5  MG tablet Commonly known as: CRESTOR Take 5 mg by mouth daily.   senna-docusate 8.6-50 MG tablet Commonly known as: Senokot-S Take 2 tablets by mouth 2 (two) times daily as needed for mild constipation.   sodium bicarbonate/sodium chloride Soln 1 application by Mouth Rinse route as needed for dry mouth.   sucralfate 1 g tablet Commonly known as: Carafate Take 1 tablet (1 g total) by mouth 4 (four) times daily -  with meals and at bedtime.        Hospital Course: Connor Adams is a 74 year old male with medical history significant for double hit DLBCL.  He presented to the hospital on 09/08/2020 for cycle 6-day 1 of EPOCH-R chemotherapy.  Chemotherapy started as planned on 09/08/2020.  Throughout the course of the patient's admission he has had no complications.  Bowels have been moving without any difficulty with scheduled MiraLAX and Senokot-S. He was able to tolerate food well without difficulty.  He did not have any issues with fevers, chills, sweats, nausea, vomiting or diarrhea.  He successfully completed all chemotherapy at full dose as prescribed. On the final day he had no concerning symptoms.   He is currently scheduled for labs, G-CSF, and rituximab at the cancer center on 09/15/2020. Lab and follow-up visit scheduled on 09/24/2020. He has our contact information and has been instructed to contact us in the event he were to develop any new concerning symptoms or issues in the interim.  He was discharged on 09/12/2020 in excellent condition.  Discharge Instructions    Call MD for:   Complete by: As directed    Call MD for:  extreme fatigue   Complete by: As directed    Call MD for:  persistant nausea and vomiting   Complete by: As directed    Call MD for:  redness, tenderness, or signs of infection (pain, swelling, redness, odor or green/yellow discharge around incision site)   Complete by: As directed    Call MD for:  severe uncontrolled pain   Complete by: As directed     Call MD for:  temperature >100.4   Complete by: As directed    Diet general   Complete by: As directed    Discharge patient   Complete by: As directed    Discharge disposition: 01-Home or Self Care   Discharge patient date: 09/12/2020   Increase activity slowly   Complete by: As directed       Discharge took <30 minutes  [name], MD Department of Hematology/Oncology [place] [facility] Phone: [phone] Pager: [phone] Email: [email or web address]

## 2020-09-12 LAB — CBC WITH DIFFERENTIAL/PLATELET
Abs Immature Granulocytes: 0.1 10*3/uL — ABNORMAL HIGH (ref 0.00–0.07)
Basophils Absolute: 0 10*3/uL (ref 0.0–0.1)
Basophils Relative: 0 %
Eosinophils Absolute: 0 10*3/uL (ref 0.0–0.5)
Eosinophils Relative: 0 %
HCT: 26.8 % — ABNORMAL LOW (ref 39.0–52.0)
Hemoglobin: 8.2 g/dL — ABNORMAL LOW (ref 13.0–17.0)
Immature Granulocytes: 1 %
Lymphocytes Relative: 2 %
Lymphs Abs: 0.1 10*3/uL — ABNORMAL LOW (ref 0.7–4.0)
MCH: 28.9 pg (ref 26.0–34.0)
MCHC: 30.6 g/dL (ref 30.0–36.0)
MCV: 94.4 fL (ref 80.0–100.0)
Monocytes Absolute: 0.1 10*3/uL (ref 0.1–1.0)
Monocytes Relative: 1 %
Neutro Abs: 7.9 10*3/uL — ABNORMAL HIGH (ref 1.7–7.7)
Neutrophils Relative %: 96 %
Platelets: 285 10*3/uL (ref 150–400)
RBC: 2.84 MIL/uL — ABNORMAL LOW (ref 4.22–5.81)
RDW: 19.9 % — ABNORMAL HIGH (ref 11.5–15.5)
WBC: 8.3 10*3/uL (ref 4.0–10.5)
nRBC: 0 % (ref 0.0–0.2)

## 2020-09-12 LAB — COMPREHENSIVE METABOLIC PANEL
ALT: 22 U/L (ref 0–44)
AST: 17 U/L (ref 15–41)
Albumin: 3.3 g/dL — ABNORMAL LOW (ref 3.5–5.0)
Alkaline Phosphatase: 41 U/L (ref 38–126)
Anion gap: 11 (ref 5–15)
BUN: 29 mg/dL — ABNORMAL HIGH (ref 8–23)
CO2: 24 mmol/L (ref 22–32)
Calcium: 8.6 mg/dL — ABNORMAL LOW (ref 8.9–10.3)
Chloride: 108 mmol/L (ref 98–111)
Creatinine, Ser: 1.03 mg/dL (ref 0.61–1.24)
GFR, Estimated: >60 mL/min (ref >60)
Glucose, Bld: 104 mg/dL — ABNORMAL HIGH (ref 70–99)
Potassium: 3.6 mmol/L (ref 3.5–5.1)
Sodium: 143 mmol/L (ref 135–145)
Total Bilirubin: 0.2 mg/dL — ABNORMAL LOW (ref 0.3–1.2)
Total Protein: 5.3 g/dL — ABNORMAL LOW (ref 6.5–8.1)

## 2020-09-12 LAB — LACTATE DEHYDROGENASE: LDH: 109 U/L (ref 98–192)

## 2020-09-12 LAB — URIC ACID: Uric Acid, Serum: 6.4 mg/dL (ref 3.7–8.6)

## 2020-09-12 NOTE — Progress Notes (Signed)
Patient discharged to home with family, discharge instructions reviewed with patient who verbalized understanding. No new medications. 

## 2020-09-14 ENCOUNTER — Other Ambulatory Visit: Payer: Self-pay | Admitting: Hematology and Oncology

## 2020-09-14 DIAGNOSIS — C8333 Diffuse large B-cell lymphoma, intra-abdominal lymph nodes: Secondary | ICD-10-CM

## 2020-09-15 ENCOUNTER — Inpatient Hospital Stay: Payer: Medicare PPO

## 2020-09-15 ENCOUNTER — Other Ambulatory Visit: Payer: Self-pay

## 2020-09-15 VITALS — BP 124/62 | HR 64 | Temp 98.3°F | Resp 18 | Wt 171.5 lb

## 2020-09-15 DIAGNOSIS — C8333 Diffuse large B-cell lymphoma, intra-abdominal lymph nodes: Secondary | ICD-10-CM

## 2020-09-15 DIAGNOSIS — Z5111 Encounter for antineoplastic chemotherapy: Secondary | ICD-10-CM | POA: Diagnosis not present

## 2020-09-15 DIAGNOSIS — Z79899 Other long term (current) drug therapy: Secondary | ICD-10-CM | POA: Diagnosis not present

## 2020-09-15 DIAGNOSIS — C8338 Diffuse large B-cell lymphoma, lymph nodes of multiple sites: Secondary | ICD-10-CM | POA: Diagnosis not present

## 2020-09-15 LAB — CBC WITH DIFFERENTIAL (CANCER CENTER ONLY)
Abs Immature Granulocytes: 0.06 10*3/uL (ref 0.00–0.07)
Basophils Absolute: 0 10*3/uL (ref 0.0–0.1)
Basophils Relative: 0 %
Eosinophils Absolute: 0 10*3/uL (ref 0.0–0.5)
Eosinophils Relative: 1 %
HCT: 28.2 % — ABNORMAL LOW (ref 39.0–52.0)
Hemoglobin: 8.8 g/dL — ABNORMAL LOW (ref 13.0–17.0)
Immature Granulocytes: 1 %
Lymphocytes Relative: 1 %
Lymphs Abs: 0 10*3/uL — ABNORMAL LOW (ref 0.7–4.0)
MCH: 28.9 pg (ref 26.0–34.0)
MCHC: 31.2 g/dL (ref 30.0–36.0)
MCV: 92.5 fL (ref 80.0–100.0)
Monocytes Absolute: 0 10*3/uL — ABNORMAL LOW (ref 0.1–1.0)
Monocytes Relative: 0 %
Neutro Abs: 4.6 10*3/uL (ref 1.7–7.7)
Neutrophils Relative %: 97 %
Platelet Count: 230 10*3/uL (ref 150–400)
RBC: 3.05 MIL/uL — ABNORMAL LOW (ref 4.22–5.81)
RDW: 19.9 % — ABNORMAL HIGH (ref 11.5–15.5)
WBC Count: 4.8 10*3/uL (ref 4.0–10.5)
nRBC: 0 % (ref 0.0–0.2)

## 2020-09-15 LAB — CMP (CANCER CENTER ONLY)
ALT: 23 U/L (ref 0–44)
AST: 17 U/L (ref 15–41)
Albumin: 3.4 g/dL — ABNORMAL LOW (ref 3.5–5.0)
Alkaline Phosphatase: 51 U/L (ref 38–126)
Anion gap: 7 (ref 5–15)
BUN: 24 mg/dL — ABNORMAL HIGH (ref 8–23)
CO2: 29 mmol/L (ref 22–32)
Calcium: 8.7 mg/dL — ABNORMAL LOW (ref 8.9–10.3)
Chloride: 105 mmol/L (ref 98–111)
Creatinine: 0.89 mg/dL (ref 0.61–1.24)
GFR, Estimated: >60 mL/min (ref >60)
Glucose, Bld: 107 mg/dL — ABNORMAL HIGH (ref 70–99)
Potassium: 3.6 mmol/L (ref 3.5–5.1)
Sodium: 141 mmol/L (ref 135–145)
Total Bilirubin: 0.8 mg/dL (ref 0.3–1.2)
Total Protein: 5.6 g/dL — ABNORMAL LOW (ref 6.5–8.1)

## 2020-09-15 LAB — LACTATE DEHYDROGENASE: LDH: 167 U/L (ref 98–192)

## 2020-09-15 LAB — SAMPLE TO BLOOD BANK

## 2020-09-15 MED ORDER — PEGFILGRASTIM-CBQV 6 MG/0.6ML ~~LOC~~ SOSY
PREFILLED_SYRINGE | SUBCUTANEOUS | Status: AC
  Filled 2020-09-15: qty 0.6

## 2020-09-15 MED ORDER — DIPHENHYDRAMINE HCL 25 MG PO CAPS
ORAL_CAPSULE | ORAL | Status: AC
  Filled 2020-09-15: qty 1

## 2020-09-15 MED ORDER — SODIUM CHLORIDE 0.9% FLUSH
10.0000 mL | Freq: Once | INTRAVENOUS | Status: AC
  Administered 2020-09-15: 10 mL via INTRAVENOUS
  Filled 2020-09-15: qty 10

## 2020-09-15 MED ORDER — HEPARIN SOD (PORK) LOCK FLUSH 100 UNIT/ML IV SOLN
500.0000 [IU] | Freq: Once | INTRAVENOUS | Status: AC
  Administered 2020-09-15: 500 [IU] via INTRAVENOUS
  Filled 2020-09-15: qty 5

## 2020-09-15 MED ORDER — PEGFILGRASTIM-CBQV 6 MG/0.6ML ~~LOC~~ SOSY
6.0000 mg | PREFILLED_SYRINGE | Freq: Once | SUBCUTANEOUS | Status: AC
  Administered 2020-09-15: 6 mg via SUBCUTANEOUS

## 2020-09-15 MED ORDER — DIPHENHYDRAMINE HCL 25 MG PO CAPS
25.0000 mg | ORAL_CAPSULE | Freq: Once | ORAL | Status: AC
  Administered 2020-09-15: 25 mg via ORAL

## 2020-09-15 MED ORDER — ACETAMINOPHEN 325 MG PO TABS
650.0000 mg | ORAL_TABLET | Freq: Once | ORAL | Status: AC
  Administered 2020-09-15: 650 mg via ORAL

## 2020-09-15 MED ORDER — ACETAMINOPHEN 325 MG PO TABS
ORAL_TABLET | ORAL | Status: AC
  Filled 2020-09-15: qty 2

## 2020-09-15 MED ORDER — SODIUM CHLORIDE 0.9 % IV SOLN
Freq: Once | INTRAVENOUS | Status: AC
  Filled 2020-09-15: qty 250

## 2020-09-15 MED ORDER — SODIUM CHLORIDE 0.9 % IV SOLN
375.0000 mg/m2 | Freq: Once | INTRAVENOUS | Status: AC
  Administered 2020-09-15: 700 mg via INTRAVENOUS
  Filled 2020-09-15: qty 50

## 2020-09-15 NOTE — Patient Instructions (Addendum)
[facility] Discharge Instructions for Patients Receiving Chemotherapy  Today you received the following chemotherapy agents: Rituxan.  To help prevent nausea and vomiting after your treatment, we encourage you to take your nausea medication as directed.   If you develop nausea and vomiting that is not controlled by your nausea medication, call the clinic.   BELOW ARE SYMPTOMS THAT SHOULD BE REPORTED IMMEDIATELY:  *FEVER GREATER THAN 100.5 F  *CHILLS WITH OR WITHOUT FEVER  NAUSEA AND VOMITING THAT IS NOT CONTROLLED WITH YOUR NAUSEA MEDICATION  *UNUSUAL SHORTNESS OF BREATH  *UNUSUAL BRUISING OR BLEEDING  TENDERNESS IN MOUTH AND THROAT WITH OR WITHOUT PRESENCE OF ULCERS  *URINARY PROBLEMS  *BOWEL PROBLEMS  UNUSUAL RASH Items with * indicate a potential emergency and should be followed up as soon as possible.  Feel free to call the clinic should you have any questions or concerns. The clinic phone number is [phone].  Please show the [facility] at check-in to the Emergency Department and triage nurse.  Pegfilgrastim injection What is this medicine? PEGFILGRASTIM (PEG fil gra stim) is a long-acting granulocyte colony-stimulating factor that stimulates the growth of neutrophils, a type of white blood cell important in the body's fight against infection. It is used to reduce the incidence of fever and infection in patients with certain types of cancer who are receiving chemotherapy that affects the bone marrow, and to increase survival after being exposed to high doses of radiation. This medicine may be used for other purposes; ask your health care provider or pharmacist if you have questions. COMMON BRAND NAME(S): Steve Rattler, Ziextenzo What should I tell my health care provider before I take this medicine? They need to know if you have any of these conditions:  kidney disease  latex allergy  ongoing radiation therapy  sickle cell  disease  skin reactions to acrylic adhesives (On-Body Injector only)  an unusual or allergic reaction to pegfilgrastim, filgrastim, other medicines, foods, dyes, or preservatives  pregnant or trying to get pregnant  breast-feeding How should I use this medicine? This medicine is for injection under the skin. If you get this medicine at home, you will be taught how to prepare and give the pre-filled syringe or how to use the On-body Injector. Refer to the patient Instructions for Use for detailed instructions. Use exactly as directed. Tell your healthcare provider immediately if you suspect that the On-body Injector may not have performed as intended or if you suspect the use of the On-body Injector resulted in a missed or partial dose. It is important that you put your used needles and syringes in a special sharps container. Do not put them in a trash can. If you do not have a sharps container, call your pharmacist or healthcare provider to get one. Talk to your pediatrician regarding the use of this medicine in children. While this drug may be prescribed for selected conditions, precautions do apply. Overdosage: If you think you have taken too much of this medicine contact a poison control center or emergency room at once. NOTE: This medicine is only for you. Do not share this medicine with others. What if I miss a dose? It is important not to miss your dose. Call your doctor or health care professional if you miss your dose. If you miss a dose due to an On-body Injector failure or leakage, a new dose should be administered as soon as possible using a single prefilled syringe for manual use. What may interact  with this medicine? Interactions have not been studied. Give your health care provider a list of all the medicines, herbs, non-prescription drugs, or dietary supplements you use. Also tell them if you smoke, drink alcohol, or use illegal drugs. Some items may interact with your  medicine. This list may not describe all possible interactions. Give your health care provider a list of all the medicines, herbs, non-prescription drugs, or dietary supplements you use. Also tell them if you smoke, drink alcohol, or use illegal drugs. Some items may interact with your medicine. What should I watch for while using this medicine? You may need blood work done while you are taking this medicine. If you are going to need a MRI, CT scan, or other procedure, tell your doctor that you are using this medicine (On-Body Injector only). What side effects may I notice from receiving this medicine? Side effects that you should report to your doctor or health care professional as soon as possible:  allergic reactions like skin rash, itching or hives, swelling of the face, lips, or tongue  back pain  dizziness  fever  pain, redness, or irritation at site where injected  pinpoint red spots on the skin  red or dark-brown urine  shortness of breath or breathing problems  stomach or side pain, or pain at the shoulder  swelling  tiredness  trouble passing urine or change in the amount of urine Side effects that usually do not require medical attention (report to your doctor or health care professional if they continue or are bothersome):  bone pain  muscle pain This list may not describe all possible side effects. Call your doctor for medical advice about side effects. You may report side effects to FDA at 1-800-FDA-1088. Where should I keep my medicine? Keep out of the reach of children. If you are using this medicine at home, you will be instructed on how to store it. Throw away any unused medicine after the expiration date on the label. NOTE: This sheet is a summary. It may not cover all possible information. If you have questions about this medicine, talk to your doctor, pharmacist, or health care provider.  2020 [employer]vier/Gold Standard (2017-12-12 16:57:08)

## 2020-09-24 ENCOUNTER — Encounter: Payer: Self-pay | Admitting: Hematology and Oncology

## 2020-09-24 ENCOUNTER — Inpatient Hospital Stay: Payer: Medicare PPO | Attending: Hematology and Oncology | Admitting: Hematology and Oncology

## 2020-09-24 ENCOUNTER — Inpatient Hospital Stay: Payer: Medicare PPO

## 2020-09-24 ENCOUNTER — Other Ambulatory Visit: Payer: Self-pay

## 2020-09-24 ENCOUNTER — Other Ambulatory Visit: Payer: Self-pay | Admitting: Hematology and Oncology

## 2020-09-24 VITALS — BP 129/51 | HR 81 | Temp 98.3°F | Resp 17 | Ht 68.0 in | Wt 168.6 lb

## 2020-09-24 DIAGNOSIS — R918 Other nonspecific abnormal finding of lung field: Secondary | ICD-10-CM

## 2020-09-24 DIAGNOSIS — K219 Gastro-esophageal reflux disease without esophagitis: Secondary | ICD-10-CM | POA: Diagnosis not present

## 2020-09-24 DIAGNOSIS — C8338 Diffuse large B-cell lymphoma, lymph nodes of multiple sites: Secondary | ICD-10-CM | POA: Insufficient documentation

## 2020-09-24 DIAGNOSIS — N179 Acute kidney failure, unspecified: Secondary | ICD-10-CM | POA: Diagnosis not present

## 2020-09-24 DIAGNOSIS — M1990 Unspecified osteoarthritis, unspecified site: Secondary | ICD-10-CM | POA: Diagnosis not present

## 2020-09-24 DIAGNOSIS — Z95828 Presence of other vascular implants and grafts: Secondary | ICD-10-CM

## 2020-09-24 DIAGNOSIS — I129 Hypertensive chronic kidney disease with stage 1 through stage 4 chronic kidney disease, or unspecified chronic kidney disease: Secondary | ICD-10-CM | POA: Insufficient documentation

## 2020-09-24 DIAGNOSIS — C8333 Diffuse large B-cell lymphoma, intra-abdominal lymph nodes: Secondary | ICD-10-CM

## 2020-09-24 DIAGNOSIS — Z85820 Personal history of malignant melanoma of skin: Secondary | ICD-10-CM | POA: Insufficient documentation

## 2020-09-24 DIAGNOSIS — R531 Weakness: Secondary | ICD-10-CM | POA: Insufficient documentation

## 2020-09-24 DIAGNOSIS — Z8673 Personal history of transient ischemic attack (TIA), and cerebral infarction without residual deficits: Secondary | ICD-10-CM | POA: Insufficient documentation

## 2020-09-24 DIAGNOSIS — N183 Chronic kidney disease, stage 3 unspecified: Secondary | ICD-10-CM | POA: Insufficient documentation

## 2020-09-24 DIAGNOSIS — E785 Hyperlipidemia, unspecified: Secondary | ICD-10-CM | POA: Diagnosis not present

## 2020-09-24 DIAGNOSIS — Z8616 Personal history of COVID-19: Secondary | ICD-10-CM | POA: Diagnosis not present

## 2020-09-24 LAB — CMP (CANCER CENTER ONLY)
ALT: 26 U/L (ref 0–44)
AST: 23 U/L (ref 15–41)
Albumin: 3.3 g/dL — ABNORMAL LOW (ref 3.5–5.0)
Alkaline Phosphatase: 95 U/L (ref 38–126)
Anion gap: 7 (ref 5–15)
BUN: 16 mg/dL (ref 8–23)
CO2: 26 mmol/L (ref 22–32)
Calcium: 8.9 mg/dL (ref 8.9–10.3)
Chloride: 109 mmol/L (ref 98–111)
Creatinine: 1.14 mg/dL (ref 0.61–1.24)
GFR, Estimated: >60 mL/min (ref >60)
Glucose, Bld: 103 mg/dL — ABNORMAL HIGH (ref 70–99)
Potassium: 3.7 mmol/L (ref 3.5–5.1)
Sodium: 142 mmol/L (ref 135–145)
Total Bilirubin: 0.3 mg/dL (ref 0.3–1.2)
Total Protein: 5.8 g/dL — ABNORMAL LOW (ref 6.5–8.1)

## 2020-09-24 LAB — LACTATE DEHYDROGENASE: LDH: 212 U/L — ABNORMAL HIGH (ref 98–192)

## 2020-09-24 LAB — CBC WITH DIFFERENTIAL (CANCER CENTER ONLY)
Abs Immature Granulocytes: 0.67 10*3/uL — ABNORMAL HIGH (ref 0.00–0.07)
Basophils Absolute: 0 10*3/uL (ref 0.0–0.1)
Basophils Relative: 0 %
Eosinophils Absolute: 0.1 10*3/uL (ref 0.0–0.5)
Eosinophils Relative: 1 %
HCT: 23.3 % — ABNORMAL LOW (ref 39.0–52.0)
Hemoglobin: 7.3 g/dL — ABNORMAL LOW (ref 13.0–17.0)
Immature Granulocytes: 7 %
Lymphocytes Relative: 2 %
Lymphs Abs: 0.2 10*3/uL — ABNORMAL LOW (ref 0.7–4.0)
MCH: 29.6 pg (ref 26.0–34.0)
MCHC: 31.3 g/dL (ref 30.0–36.0)
MCV: 94.3 fL (ref 80.0–100.0)
Monocytes Absolute: 0.8 10*3/uL (ref 0.1–1.0)
Monocytes Relative: 9 %
Neutro Abs: 7.3 10*3/uL (ref 1.7–7.7)
Neutrophils Relative %: 81 %
Platelet Count: 188 10*3/uL (ref 150–400)
RBC: 2.47 MIL/uL — ABNORMAL LOW (ref 4.22–5.81)
RDW: 20.4 % — ABNORMAL HIGH (ref 11.5–15.5)
WBC Count: 9.1 10*3/uL (ref 4.0–10.5)
nRBC: 0 % (ref 0.0–0.2)

## 2020-09-24 LAB — SAMPLE TO BLOOD BANK

## 2020-09-24 MED ORDER — HEPARIN SOD (PORK) LOCK FLUSH 100 UNIT/ML IV SOLN
500.0000 [IU] | Freq: Once | INTRAVENOUS | Status: AC
  Administered 2020-09-24: 500 [IU] via INTRAVENOUS
  Filled 2020-09-24: qty 5

## 2020-09-24 MED ORDER — SODIUM CHLORIDE 0.9% FLUSH
10.0000 mL | Freq: Once | INTRAVENOUS | Status: AC
  Administered 2020-09-24: 10 mL via INTRAVENOUS
  Filled 2020-09-24: qty 10

## 2020-09-24 NOTE — Progress Notes (Signed)
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   1) 04/17/2020:  CT A/P showed dominant nodal mass (11.5 x 10.9 cm) within the jejunal mesentery, highly suspicious for lymphoma. Additionally there are left lower lobe pleural-based pulmonary nodules 2) 04/24/2020: establish care with Dr. Lorenso Courier 3) 04/28/2020:  PET CT scan shows Large solid left mesenteric mass 13.0 cm with maximum SUV of 21.2, [place] 5 with local hypermetabolic porta hepatis and retroperitoneal lymph nodes. Constitutes bulky Stage I disease.  4) 05/27/2020-05/31/2020: Cycle 1 of R-EPOCH 5) 06/16/2020-06/23/2020: Cycle 2 of R-EPOCH 6) 07/07/2020-07/11/2020: Cycle 3 of R-EPOCH 7) 07/28/2020-08/01/2020: Cycle 4 of R-EPOCH 8) 08/18/2020-08/22/2020: Cycle 5 of R-EPOCH 9) 09/08/2020-09/12/2020: Cycle 6 of R-EPOCH  Interval History:  Connor Adams 75 y.o. male with medical history significant for Stage I DLBCL who presents for a follow up visit. The patient was last seen while in house from 09/08/2020-09/12/2020 for R-EPOCH. In the interim since then he received rituximab and GCSF therapy on 09/15/2020.   On exam today Connor Adams is accompanied by his wife.  He notes he did well in the interim since his discharge from the hospital.  He reports that he maintained 2 good days of energy after discharge, however after that he had his "slump".  He notes that he is beginning to recover from this and that yesterday he even was able to work out because his energy was coming back.  He reports that he does still have some trouble with appetite as food does not have taste.  He also has a small ulcer on the left side of his tongue which is currently in the process of resolving.  He has been using his Magic mouthwash to try to help  with this.  Otherwise he is not had any fevers, chills, sweats, nausea vomiting or diarrhea.  Reports that he is sleeping well and is not having any other new symptoms at this time.  A full 10 point ROS is listed below.  MEDICAL HISTORY:  Past Medical History:  Diagnosis Date  . Arthritis   . Chronic kidney disease (CKD), stage III (moderate) (HCC)   . COVID-19   . GERD (gastroesophageal reflux disease)    occ  . Hemorrhoids   . History of ETT 3/05   low risk  . History of hiatal hernia   . History of kidney stones   . HLD (hyperlipidemia)   . HTN (hypertension)   . Left inguinal hernia   . Melanoma ([place])    excied  . 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 Stroke [facility]) 2011   tia   . TIA (transient ischemic attack) 4/11   associated w slurred speecha ndmild facial droop. lasted for about 10 minutes. Had MRI, etc with [facility] that per his report was ok (dr. Stephannie Li). Echo (4/11): EF 55-60%, no regional WMAs, normal diastolic function, mild LAE, no source of embolus    SURGICAL HISTORY: Past Surgical History:  Procedure Laterality Date  . BMI 30.2 muscular  12/08/04  . curretage actinic keratosis R ear  01/20/05  . ETT low prob of CAD  11/21/03  . excision melanoma in situ back  01/20/05  . hemorrhoids sclerosed at colonoscopy  12/19/05  . IR IMAGING GUIDED PORT INSERTION  05/19/2020  . LAPAROSCOPIC INGUINAL  HERNIA REPAIR Left 12/20/1995  . LUMBAR DISC SURGERY    . NERVE REPAIR     back of neck  . retinal laser surgery Left 09/20/1986  . SEPTOPLASTY  11/19/98  . TOTAL KNEE ARTHROPLASTY Left 12/13/2016   Procedure: TOTAL KNEE ARTHROPLASTY WITH RIGHT KNEE CORTISONE INJECTION;  Surgeon: Frederik Pear, MD;  Location: [place];  Service: Orthopedics;  Laterality: Left;  . uric acid 6.9  12/22/04  . x-ray l great toe  07/21/00    SOCIAL HISTORY: Social History   Socioeconomic History  . Marital status: Married    Spouse name: [name]  . Number of children: 2  . Years of  education: 67  . Highest education level: Not on file  Occupational History  . Occupation: Systems developer: [employer]  Tobacco Use  . Smoking status: Never Smoker  . Smokeless tobacco: Never Used  Vaping Use  . Vaping Use: Never used  Substance and Sexual Activity  . Alcohol use: No  . Drug use: No  . Sexual activity: Yes  Other Topics Concern  . Not on file  Social History Narrative   Married to [employer] at [employer].   Son, [name] married   Son, [name], Married.    Caffeine use: 1 cup coffee per day   Social Determinants of Health   Financial Resource Strain: Not on file  Food Insecurity: Not on file  Transportation Needs: Not on file  Physical Activity: Not on file  Stress: Not on file  Social Connections: Not on file  Intimate Partner Violence: Not on file    FAMILY HISTORY: Family History  Problem Relation Age of Onset  . Liver cancer Sister   . Heart failure Brother 44       CABGx4  . Heart attack Mother 56  . Uterine cancer Mother   . Heart attack Father 42  . Asthma Son   . Diabetes Brother   . Prostate cancer Brother     ALLERGIES:  has No Known Allergies.  MEDICATIONS:  Current Outpatient Medications  Medication Sig Dispense Refill  . fluticasone (FLONASE) 50 MCG/ACT nasal spray Place 1-2 sprays into both nostrils daily as needed for allergies or rhinitis.  2  . hydrocortisone 2.5 % cream     . acetaminophen (TYLENOL) 325 MG tablet Take 2 tablets (650 mg total) by mouth every 6 (six) hours as needed for mild pain, moderate pain or fever. (Patient not taking: No sig reported)    . feeding supplement, ENSURE ENLIVE, (ENSURE ENLIVE) LIQD Take 237 mLs by mouth 2 (two) times daily between meals. 237 mL 12  . hydrocortisone (ANUSOL-HC) 2.5 % rectal cream Place 1 application rectally daily as needed. (Patient not taking: Reported on 09/24/2020) 30 g 0  . ibuprofen (ADVIL) 200 MG tablet     . losartan (COZAAR) 25 MG  tablet Take 25 mg by mouth daily.     . magic mouthwash w/lidocaine SOLN Take 5 mLs by mouth 4 (four) times daily. Lidocaine 80 mls; benadryl liquid 80 mls; maalox 80 mls 240 mL 1  . Multiple Vitamin (MULTI VITAMIN) TABS     . ondansetron (ZOFRAN ODT) 8 MG disintegrating tablet Take 1 tablet (8 mg total) by mouth every 8 (eight) hours as needed for nausea or vomiting. (Patient not taking: Reported on 09/08/2020) 30 tablet 5  . pantoprazole (PROTONIX) 40 MG tablet Take 1 tablet (40 mg total) by mouth 2 (two) times  daily. 60 tablet 5  . polyethylene glycol (MIRALAX / GLYCOLAX) 17 g packet Take 17 g by mouth daily as needed for moderate constipation. 14 each 0  . polyvinyl alcohol (LIQUIFILM TEARS) 1.4 % ophthalmic solution Place 1 drop into both eyes as needed for dry eyes.    Marland Kitchen prochlorperazine (COMPAZINE) 10 MG tablet Take 1 tablet (10 mg total) by mouth every 6 (six) hours as needed for nausea or vomiting. (Patient not taking: Reported on 09/08/2020) 30 tablet 0  . promethazine (PHENERGAN) 25 MG tablet Take 1 tablet (25 mg total) by mouth every 6 (six) hours as needed for nausea or vomiting. (Patient not taking: Reported on 09/08/2020) 45 tablet 2  . rosuvastatin (CRESTOR) 5 MG tablet Take 5 mg by mouth daily.    Marland Kitchen senna-docusate (SENOKOT-S) 8.6-50 MG tablet Take 2 tablets by mouth 2 (two) times daily as needed for mild constipation.    . Sodium Chloride-Sodium Bicarb (SODIUM BICARBONATE/SODIUM CHLORIDE) SOLN 1 application by Mouth Rinse route as needed for dry mouth.    . sucralfate (CARAFATE) 1 g tablet Take 1 tablet (1 g total) by mouth 4 (four) times daily -  with meals and at bedtime. 60 tablet 3   No current facility-administered medications for this visit.    REVIEW OF SYSTEMS:   Constitutional: ( - ) fevers, ( - )  chills , ( - ) night sweats Eyes: ( - ) blurriness of vision, ( - ) double vision, ( - ) watery eyes Ears, nose, mouth, throat, and face: ( - ) mucositis, ( - ) sore  throat Respiratory: ( - ) cough, ( - ) dyspnea, ( - ) wheezes Cardiovascular: ( - ) palpitation, ( - ) chest discomfort, ( - ) lower extremity swelling Gastrointestinal:  ( - ) nausea, ( - ) heartburn, ( - ) change in bowel habits Skin: ( - ) abnormal skin rashes Lymphatics: ( - ) new lymphadenopathy, ( - ) easy bruising Neurological: ( - ) numbness, ( - ) tingling, ( - ) new weaknesses Behavioral/Psych: ( - ) mood change, ( - ) new changes  All other systems were reviewed with the patient and are negative.  PHYSICAL EXAMINATION: ECOG PERFORMANCE STATUS: 1 - Symptomatic but completely ambulatory  Vitals:   09/24/20 1029  BP: (!) 129/51  Pulse: 81  Resp: 17  Temp: 98.3 F (36.8 C)  SpO2: 98%   Filed Weights   09/24/20 1029  Weight: 168 lb 9.6 oz (76.5 kg)    GENERAL: well appearing elderly Caucasian male. alert, no distress and comfortable SKIN: skin color, texture, turgor are normal, no rashes or significant lesions. Bald, prior erythematous skin lesions clearing up.  EYES: conjunctiva are pink and non-injected, sclera clear LUNGS: clear to auscultation and percussion with normal breathing effort HEART: regular rate & rhythm and no murmurs and no lower extremity edema ABDOMEN: soft, nontender. No evidence of previously noted mass.  Musculoskeletal: no cyanosis of digits and no clubbing  PSYCH: alert & oriented x 3, fluent speech NEURO: no focal motor/sensory deficits  LABORATORY DATA:  I have reviewed the data as listed CBC Latest Ref Rng & Units 09/24/2020 09/15/2020 09/12/2020  WBC 4.0 - 10.5 K/uL 9.1 4.8 8.3  Hemoglobin 13.0 - 17.0 g/dL 7.3(L) 8.8(L) 8.2(L)  Hematocrit 39.0 - 52.0 % 23.3(L) 28.2(L) 26.8(L)  Platelets 150 - 400 K/uL 188 230 285    CMP Latest Ref Rng & Units 09/24/2020 09/15/2020 09/12/2020  Glucose 70 - 99 mg/dL 103(H)  107(H) 104(H)  BUN 8 - 23 mg/dL 16 24(H) 29(H)  Creatinine 0.61 - 1.24 mg/dL 1.14 0.89 1.03  Sodium 135 - 145 mmol/L 142 141 143   Potassium 3.5 - 5.1 mmol/L 3.7 3.6 3.6  Chloride 98 - 111 mmol/L 109 105 108  CO2 22 - 32 mmol/L 26 29 24   Calcium 8.9 - 10.3 mg/dL 8.9 8.7(L) 8.6(L)  Total Protein 6.5 - 8.1 g/dL 5.8(L) 5.6(L) 5.3(L)  Total Bilirubin 0.3 - 1.2 mg/dL 0.3 0.8 0.2(L)  Alkaline Phos 38 - 126 U/L 95 51 41  AST 15 - 41 U/L 23 17 17   ALT 0 - 44 U/L 26 23 22     RADIOGRAPHIC STUDIES: I have personally reviewed the radiological images as listed and agreed with the findings in the report: large abdominal mass markedly decreased in FDG avidity. New diffuse pulmonary nodules.   No results found.  ASSESSMENT & PLAN Connor Adams 75 y.o. male with medical history significant for Stage I DLBCL who presents for a follow up visit.  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  At this time he has completed all planned chemotherapy treatment (R-EPOCH x 6 cycles).   On exam today Connor Adams appears to be recovering well from his chemotherapy.  His hemoglobin is 7.2, but his energy level is good and is able to perform his activities of daily living without difficulty.  His weight is stable and otherwise he is quite healthy.   The current plan is for a repeat PET CT scan as he has completed 6 cycles of R-EPOCH chemotherapy in the inpatient setting. He is currently s/p Cycle 6 with a PET CT scan to be performed this month.   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  --findings consistent with a double hit DLBCL Treatment of choice is R-EPOCH chemotherapy in the inpatient setting. --he is currently s/p Cycle 6 administered on 09/08/2020 --patient has a port in place. TTE was performed and showed strong baseline heart  function. --plan for repeat evaluation with PET CT scan to be performed this month.  --RTC in approximately 3 weeks to reassess.   #Lung Nodules -- noted on interim PET CT scan after Cycle 4.  --unclear if these represent areas of infection, metastatic disease, or progression of lymphoma --pulmonology consulted, appreciate their input.  --will monitor respiratory status closely --repeat PET CT scan after Cycle 6 (Jan 2022)   #Oral Ulcers, resolving -- oral lesion on left side of tongue. During prior cycles was noted to be on roof of mouth and buccal membranes --patient using magic mouthwash  #Symptom Management --discontinued allopurinol 300mg  PO daily  --prescribed ondansetron 8mg  PO q8H PRN and compazine 10mg  PO q6H prn for nausea --discontinue Protonix/sulcrafate as these were administered in the setting of high dose steroids --encourage daily BM, prescribed senna docusate and miralax.  Orders Placed This Encounter  Procedures  . NM PET Image Restag (PS) Skull Base To Thigh    Standing Status:   Future    Standing Expiration Date:   09/24/2021    Order Specific Question:   If indicated for the ordered procedure, I authorize the administration of a radiopharmaceutical per Radiology protocol    Answer:  Yes    Order Specific Question:   Preferred imaging location?    Answer:   [facility]   All questions were answered. The patient knows to call the clinic with any problems, questions or concerns.  A total of more than 30 minutes were spent on this encounter and over half of that time was spent on counseling and coordination of care as outlined above.   Ulysees Barns, MD Department of Hematology/Oncology [facility] Phone: [phone] Pager: [phone] Email: [email or web address]  09/24/2020 11:31 AM

## 2020-09-25 ENCOUNTER — Telehealth: Payer: Self-pay | Admitting: Hematology and Oncology

## 2020-09-25 NOTE — Telephone Encounter (Signed)
Scheduled per 1/5 los. Called pt snd spoke with wife to confirmed 1/26 appts

## 2020-10-02 ENCOUNTER — Encounter: Payer: Self-pay | Admitting: *Deleted

## 2020-10-06 ENCOUNTER — Other Ambulatory Visit: Payer: Self-pay

## 2020-10-06 ENCOUNTER — Ambulatory Visit (HOSPITAL_COMMUNITY)
Admission: RE | Admit: 2020-10-06 | Discharge: 2020-10-06 | Disposition: A | Discharge status: Home / Self Care | Payer: Medicare PPO | Source: Ambulatory Visit | Attending: Hematology and Oncology | Admitting: Hematology and Oncology

## 2020-10-06 DIAGNOSIS — C8333 Diffuse large B-cell lymphoma, intra-abdominal lymph nodes: Secondary | ICD-10-CM | POA: Insufficient documentation

## 2020-10-06 DIAGNOSIS — R918 Other nonspecific abnormal finding of lung field: Secondary | ICD-10-CM | POA: Insufficient documentation

## 2020-10-06 LAB — GLUCOSE, CAPILLARY: Glucose-Capillary: 114 mg/dL — ABNORMAL HIGH (ref 70–99)

## 2020-10-06 MED ORDER — FLUDEOXYGLUCOSE F - 18 (FDG) INJECTION
8.4000 | Freq: Once | INTRAVENOUS | Status: AC | PRN
  Administered 2020-10-06: 8.4 via INTRAVENOUS

## 2020-10-08 ENCOUNTER — Telehealth: Payer: Self-pay | Admitting: Hematology and Oncology

## 2020-10-08 NOTE — Telephone Encounter (Signed)
Called Connor Adams to discuss the results of his recent PET CT scan.  PET CT scan shows a good response in his abdominal mass with near resolution of all of the previously noted FDG avid lesions within the lung.  He does have a small 1.1 cm lesion which may represent a reactive lymph node in the chest.  Additionally has a small mesenteric lymph node which is brighter than background.  Otherwise I do believe the results of the scan are quite good and show an excellent response to chemotherapy.  Mr. and Mrs. Connor Adams voiced understanding of these findings.  We will plan to see them back next week for clinic visit.  [name], MD Department of Hematology/Oncology [place] [facility] Phone: [phone] Pager: [phone] Email: [email or web address]

## 2020-10-14 ENCOUNTER — Other Ambulatory Visit: Payer: Self-pay | Admitting: Hematology and Oncology

## 2020-10-14 DIAGNOSIS — C8333 Diffuse large B-cell lymphoma, intra-abdominal lymph nodes: Secondary | ICD-10-CM

## 2020-10-14 NOTE — Progress Notes (Signed)
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   1) 04/17/2020:  CT A/P showed dominant nodal mass (11.5 x 10.9 cm) within the jejunal mesentery, highly suspicious for lymphoma. Additionally there are left lower lobe pleural-based pulmonary nodules 2) 04/24/2020: establish care with Dr. Lorenso Courier 3) 04/28/2020:  PET CT scan shows Large solid left mesenteric mass 13.0 cm with maximum SUV of 21.2, Deauville 5 with local hypermetabolic porta hepatis and retroperitoneal lymph nodes. Constitutes bulky Stage I disease.  4) 05/27/2020-05/31/2020: Cycle 1 of R-EPOCH 5) 06/16/2020-06/23/2020: Cycle 2 of R-EPOCH 6) 07/07/2020-07/11/2020: Cycle 3 of R-EPOCH  7) 07/28/2020-08/01/2020: Cycle 4 of R-EPOCH 8) 08/18/2020-08/22/2020: Cycle 5 of R-EPOCH 9) 09/08/2020-09/12/2020: Cycle 6 of R-EPOCH 10) 10/06/2020: PET CT scan shows decreased size of dominant mesenteric mass, however there was hypermetabolic activity within a single left anterior mesenteric lymph node   Interval History:  Connor Adams 75 y.o. male with medical history significant for Stage I DLBCL who presents for a follow up visit. The patient was last seen on 09/24/2020 after he completed his last cycle of R-EPOCH. In the interim since then he underwent a PET CT scan which showed good response in his tumor and resolution of the PET avid lesions in the chest.   On exam today Connor Adams is accompanied by his wife.  He has not had his usual level of energy.  He has had some cough, runny nose, and has been taking loratadine without much resolution.  He reports that he is also lost quite a bit of weight in the interim as well.  He has had considerable changes in his taste and reports that "sweet things taste too sweet".  He has been trying to  drink water more but has not been able to drink ensures due to the change in the taste.  He currently denies having issues with fevers, chills, sweats, nausea, vomiting or diarrhea.  A full 10 point ROS is listed below.  MEDICAL HISTORY:  Past Medical History:  Diagnosis Date  . Arthritis   . Chronic kidney disease (CKD), stage III (moderate) (HCC)   . COVID-19   . GERD (gastroesophageal reflux disease)    occ  . Hemorrhoids   . History of ETT 3/05   low risk  . History of hiatal hernia   . History of kidney stones   . HLD (hyperlipidemia)   . HTN (hypertension)   . Left inguinal hernia   . Melanoma ([place])    excied  . 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 Stroke [facility]) 2011   tia   . TIA (transient ischemic attack) 4/11   associated w slurred speecha ndmild facial droop. lasted for about 10 minutes. Had MRI, etc with [facility] that per his report was ok (dr. Stephannie Adams). Echo (4/11): EF 55-60%, no regional WMAs, normal diastolic function, mild LAE, no source of embolus    SURGICAL HISTORY: Past Surgical History:  Procedure Laterality Date  . BMI 30.2 muscular  12/08/04  . curretage actinic keratosis R ear  01/20/05  . ETT low prob of CAD  11/21/03  . excision melanoma in situ back  01/20/05  . hemorrhoids sclerosed at colonoscopy  12/19/05  . IR IMAGING GUIDED PORT INSERTION  05/19/2020  . LAPAROSCOPIC INGUINAL HERNIA REPAIR Left 12/20/1995  .  LUMBAR DISC SURGERY    . NERVE REPAIR     back of neck  . retinal laser surgery Left 09/20/1986  . SEPTOPLASTY  11/19/98  . TOTAL KNEE ARTHROPLASTY Left 12/13/2016   Procedure: TOTAL KNEE ARTHROPLASTY WITH RIGHT KNEE CORTISONE INJECTION;  Surgeon: Frederik Pear, MD;  Location: [place];  Service: Orthopedics;  Laterality: Left;  . uric acid 6.9  12/22/04  . x-ray l great toe  07/21/00    SOCIAL HISTORY: Social History   Socioeconomic History  . Marital status: Married    Spouse name: Connor Adams  . Number of children: 2  . Years of  education: 74  . Highest education level: Not on file  Occupational History  . Occupation: Systems developer: [employer]  Tobacco Use  . Smoking status: Never Smoker  . Smokeless tobacco: Never Used  Vaping Use  . Vaping Use: Never used  Substance and Sexual Activity  . Alcohol use: No  . Drug use: No  . Sexual activity: Yes  Other Topics Concern  . Not on file  Social History Narrative   Married to [employer] at [employer].   Son, Connor Adams married   Son, Connor Adams, Married.    Caffeine use: 1 cup coffee per day   Social Determinants of Health   Financial Resource Strain: Not on file  Food Insecurity: Not on file  Transportation Needs: Not on file  Physical Activity: Not on file  Stress: Not on file  Social Connections: Not on file  Intimate Partner Violence: Not on file    FAMILY HISTORY: Family History  Problem Relation Age of Onset  . Liver cancer Sister   . Heart failure Brother 44       CABGx4  . Heart attack Mother 33  . Uterine cancer Mother   . Heart attack Father 9  . Asthma Son   . Diabetes Brother   . Prostate cancer Brother     ALLERGIES:  has No Known Allergies.  MEDICATIONS:  Current Outpatient Medications  Medication Sig Dispense Refill  . acetaminophen (TYLENOL) 325 MG tablet Take 2 tablets (650 mg total) by mouth every 6 (six) hours as needed for mild pain, moderate pain or fever. (Patient not taking: No sig reported)    . feeding supplement, ENSURE ENLIVE, (ENSURE ENLIVE) LIQD Take 237 mLs by mouth 2 (two) times daily between meals. 237 mL 12  . fluticasone (FLONASE) 50 MCG/ACT nasal spray Place 1-2 sprays into both nostrils daily as needed for allergies or rhinitis.  2  . hydrocortisone (ANUSOL-HC) 2.5 % rectal cream Place 1 application rectally daily as needed. (Patient not taking: Reported on 09/24/2020) 30 g 0  . hydrocortisone 2.5 % cream     . ibuprofen (ADVIL) 200 MG tablet     . losartan (COZAAR) 25 MG  tablet Take 25 mg by mouth daily.     . magic mouthwash w/lidocaine SOLN Take 5 mLs by mouth 4 (four) times daily. Lidocaine 80 mls; benadryl liquid 80 mls; maalox 80 mls 240 mL 1  . Multiple Vitamin (MULTI VITAMIN) TABS     . ondansetron (ZOFRAN ODT) 8 MG disintegrating tablet Take 1 tablet (8 mg total) by mouth every 8 (eight) hours as needed for nausea or vomiting. (Patient not taking: Reported on 09/08/2020) 30 tablet 5  . pantoprazole (PROTONIX) 40 MG tablet Take 1 tablet (40 mg total) by mouth 2 (two) times daily. 60 tablet 5  .  polyethylene glycol (MIRALAX / GLYCOLAX) 17 g packet Take 17 g by mouth daily as needed for moderate constipation. 14 each 0  . polyvinyl alcohol (LIQUIFILM TEARS) 1.4 % ophthalmic solution Place 1 drop into both eyes as needed for dry eyes.    Marland Kitchen prochlorperazine (COMPAZINE) 10 MG tablet Take 1 tablet (10 mg total) by mouth every 6 (six) hours as needed for nausea or vomiting. (Patient not taking: Reported on 09/08/2020) 30 tablet 0  . promethazine (PHENERGAN) 25 MG tablet Take 1 tablet (25 mg total) by mouth every 6 (six) hours as needed for nausea or vomiting. (Patient not taking: Reported on 09/08/2020) 45 tablet 2  . rosuvastatin (CRESTOR) 5 MG tablet Take 5 mg by mouth daily.    Marland Kitchen senna-docusate (SENOKOT-S) 8.6-50 MG tablet Take 2 tablets by mouth 2 (two) times daily as needed for mild constipation.    . Sodium Chloride-Sodium Bicarb (SODIUM BICARBONATE/SODIUM CHLORIDE) SOLN 1 application by Mouth Rinse route as needed for dry mouth.    . sucralfate (CARAFATE) 1 g tablet Take 1 tablet (1 g total) by mouth 4 (four) times daily -  with meals and at bedtime. 60 tablet 3   No current facility-administered medications for this visit.    REVIEW OF SYSTEMS:   Constitutional: ( - ) fevers, ( - )  chills , ( - ) night sweats Eyes: ( - ) blurriness of vision, ( - ) double vision, ( - ) watery eyes Ears, nose, mouth, throat, and face: ( - ) mucositis, ( - ) sore  throat Respiratory: ( - ) cough, ( - ) dyspnea, ( - ) wheezes Cardiovascular: ( - ) palpitation, ( - ) chest discomfort, ( - ) lower extremity swelling Gastrointestinal:  ( - ) nausea, ( - ) heartburn, ( - ) change in bowel habits Skin: ( - ) abnormal skin rashes Lymphatics: ( - ) new lymphadenopathy, ( - ) easy bruising Neurological: ( - ) numbness, ( - ) tingling, ( - ) new weaknesses Behavioral/Psych: ( - ) mood change, ( - ) new changes  All other systems were reviewed with the patient and are negative.  PHYSICAL EXAMINATION: ECOG PERFORMANCE STATUS: 1 - Symptomatic but completely ambulatory  There were no vitals filed for this visit. There were no vitals filed for this visit.  GENERAL: well appearing elderly Caucasian male. alert, no distress and comfortable SKIN: skin color, texture, turgor are normal, no rashes or significant lesions. Bald, prior erythematous skin lesions clearing up.  EYES: conjunctiva are pink and non-injected, sclera clear LUNGS: clear to auscultation and percussion with normal breathing effort HEART: regular rate & rhythm and no murmurs and no lower extremity edema ABDOMEN: soft, nontender. No evidence of previously noted mass.  Musculoskeletal: no cyanosis of digits and no clubbing  PSYCH: alert & oriented x 3, fluent speech NEURO: no focal motor/sensory deficits  LABORATORY DATA:  I have reviewed the data as listed CBC Latest Ref Rng & Units 09/24/2020 09/15/2020 09/12/2020  WBC 4.0 - 10.5 K/uL 9.1 4.8 8.3  Hemoglobin 13.0 - 17.0 g/dL 7.3(L) 8.8(L) 8.2(L)  Hematocrit 39.0 - 52.0 % 23.3(L) 28.2(L) 26.8(L)  Platelets 150 - 400 K/uL 188 230 285    CMP Latest Ref Rng & Units 09/24/2020 09/15/2020 09/12/2020  Glucose 70 - 99 mg/dL 103(H) 107(H) 104(H)  BUN 8 - 23 mg/dL 16 24(H) 29(H)  Creatinine 0.61 - 1.24 mg/dL 1.14 0.89 1.03  Sodium 135 - 145 mmol/L 142 141 143  Potassium 3.5 -  5.1 mmol/L 3.7 3.6 3.6  Chloride 98 - 111 mmol/L 109 105 108  CO2 22 - 32  mmol/L 26 29 24   Calcium 8.9 - 10.3 mg/dL 8.9 8.7(L) 8.6(L)  Total Protein 6.5 - 8.1 g/dL 5.8(L) 5.6(L) 5.3(L)  Total Bilirubin 0.3 - 1.2 mg/dL 0.3 0.8 0.2(L)  Alkaline Phos 38 - 126 U/L 95 51 41  AST 15 - 41 U/L 23 17 17   ALT 0 - 44 U/L 26 23 22     RADIOGRAPHIC STUDIES: I have personally reviewed the radiological images as listed and agreed with the findings in the report: large abdominal mass markedly decreased in FDG avidity. Resolution of diffuse pulmonary nodules.   NM PET Image Restag (PS) Skull Base To Thigh  Result Date: 10/06/2020 CLINICAL DATA:  Subsequent treatment strategy for diffuse B-cell lymphoma. EXAM: NUCLEAR MEDICINE PET SKULL BASE TO THIGH TECHNIQUE: 8.4 mCi F-18 FDG was injected intravenously. Full-ring PET imaging was performed from the skull base to thigh after the radiotracer. CT data was obtained and used for attenuation correction and anatomic localization. Fasting blood glucose: 114 mg/dl COMPARISON:  PET-CT 08/15/2020 and 04/28/2020. FINDINGS: Mediastinal blood pool activity: SUV max 1.9 Liver activity: SUV max 3.2 NECK: No hypermetabolic cervical lymph nodes are identified.Asymmetrically increased metabolic activity along the right aspect of the vocal cord, similar to the most recent previous study (SUV max 7.7). This could be physiologic or relate to contralateral vocal cord paralysis. No other lesions of the pharyngeal mucosal space. Incidental CT findings: none CHEST: Mildly increased metabolic activity medially in the AP window, corresponding with a 1.1 cm short axis lymph node on image 74/4 (SUV max 4.4). No other hypermetabolic mediastinal, hilar or axillary lymph nodes. Interval improvement in the multifocal pulmonary nodularity which had progressed on the most recent study. There is associated mild hypermetabolic activity which has generally improved. Representative lesions include a 6 mm left upper lobe nodule on image 23/8 (SUV max 2.2), previously 8 mm and SUV  max 4.9; 7 mm medial left upper lobe nodule on image 18/8 (SUV max 3.6), previously 9 mm and SUV max 4.8; and 7 mm right middle lobe nodule on image 42/8 (SUV max 1.4), previously 10 mm an SUV max 7.1. The patchy nodular airspace opacities in the left lower lobe have improved, likely inflammatory. No new or enlarging nodules. Incidental CT findings: Right IJ Port-A-Cath extends to the mid right atrium. Mild atherosclerosis of the aorta, great vessels and coronary arteries. Stable small pericardial effusion. No significant pleural fluid. ABDOMEN/PELVIS: There is no hypermetabolic activity within the liver, adrenal glands, spleen or pancreas. The spleen is normal in size without hypermetabolic activity (SUV max 3.2). The dominant mesenteric mass has decreased in size, measuring approximately 5.8 x 3.8 cm on image 135/4 (previously 7.1 x 3.8 cm). This demonstrates metabolic activity similar to background bowel activity (SUV max 4.3). There is a persistent hypermetabolic left mesenteric node measuring 1.9 x 0.9 cm on image 142/4 (SUV max 5.5). This previously was less well-defined and smaller, measuring 1.7 x 0.8 cm with an SUV max of 3.8. No other hypermetabolic lymph nodes are seen. Incidental CT findings: Multiple nonobstructing renal calculi bilaterally. Calcified granulomas in the spleen and mild aortic atherosclerosis. SKELETON: There is no hypermetabolic activity to suggest osseous metastatic disease. The diffusely increased marrow uptake on the prior study has resolved, attributed to treatment related changes. 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 Healing fractures of the left 7th  and 8th ribs posteriorly. IMPRESSION: 1. Further decreased size of dominant mesenteric mass with metabolic activity similar to background bowel (Deauville 3/4). 2. Increased size and increased hypermetabolic activity within a single left anterior mesenteric lymph node  (Deauville 4/5). 3. Interval improvement in the size and metabolic activity of multiple previously demonstrated pulmonary nodules. It is uncertain as to whether these relate to the patient's lymphoma or superimposed inflammation/infection. 4. Single low level hypermetabolic activity within an AP window lymph node, probably reactive. 5. 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 Correlate clinically. Electronically Signed   By: Richardean Sale M.D.   On: 10/06/2020 10:05    ASSESSMENT & PLAN NAYQUAN STRIMPLE 75 y.o. male with medical history significant for Stage I DLBCL who presents for a follow up visit.  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  At this time he has completed all planned chemotherapy treatment (R-EPOCH x 6 cycles).   On exam today Mr. Verdun not his usual self.  He has hypercalcemia, elevation in creatinine, and leukocytosis.  Fortunately he tested negative for Covid.  We are having him admitted for further evaluation of this issue.  At this time I do not believe it is related to his cancer as his PET scan from a few days ago shows an excellent response to treatment.  IPI Score: 2 points, good prognosis/ low-intermediate risk group (81% OS, 80% PFS)  #Hypercalcemia #Acute Kidney Injury #Weakness --Labs today unfortunately show a calcium 14.8, creatinine of 2.08.  Hemoglobin is at 9.2 and white blood cell count is elevated at 11.1. --patient is COVID negative, PET CT scan from last week shows good response to chemotherapy with no strong evidence of active disease.  --Etiology of the findings are concerning for either adrenal insufficiency or renal dysfunction.  Patient has been eating and drinking normally, though his  energy levels have been low. --We will need to admit patient for observation and continued monitoring with hydration. --Appreciate the care of the hospitalist team.  We will continue to follow while in house.  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  --findings consistent with a double hit DLBCL Treatment of choice is R-EPOCH chemotherapy in the inpatient setting. --he is currently s/p Cycle 6 administered on 09/08/2020 --patient has a port in place. TTE was performed and showed strong baseline heart function. --repeat evaluation PET CT scan shows good response to chemotherapy.  --RTC pending disposition from the hospital  #Lung Nodules, resolved -- noted on interim PET CT scan after Cycle 4.  --unclear if these represent areas of infection, metastatic disease, or progression of lymphoma --pulmonology consulted, appreciate their input.  --will monitor respiratory status closely --repeat PET CT in Jan 2022 showed resolution of the lesions  #Oral Ulcers, resolved -- oral lesion on left side of tongue. During prior cycles was noted to be on roof of mouth and buccal membranes --patient used magic mouthwash --no currently present  #Symptom Management --discontinued allopurinol 300mg  PO daily  --prescribed ondansetron 8mg  PO q8H PRN and compazine 10mg  PO q6H prn for nausea --discontinue Protonix/sulcrafate as these were administered in the setting of high dose steroids --encourage daily BM, prescribed senna docusate and miralax.  No orders of the defined types were placed in  this encounter.  All questions were answered. The patient knows to call the clinic with any problems, questions or concerns.  A total of more than 30 minutes were spent on this encounter and over half of that time was spent on counseling and coordination of care as outlined above.   [name], MD Department of Hematology/Oncology [place] [facility] Phone: [phone] Pager: [phone] Email: [email or web address]  10/14/2020 7:15 PM

## 2020-10-15 ENCOUNTER — Inpatient Hospital Stay: Payer: Medicare PPO | Admitting: Hematology and Oncology

## 2020-10-15 ENCOUNTER — Telehealth: Payer: Self-pay | Admitting: *Deleted

## 2020-10-15 ENCOUNTER — Inpatient Hospital Stay: Payer: Medicare PPO

## 2020-10-15 ENCOUNTER — Other Ambulatory Visit: Payer: Self-pay

## 2020-10-15 ENCOUNTER — Inpatient Hospital Stay (HOSPITAL_COMMUNITY)
Admission: AD | Admit: 2020-10-15 | Discharge: 2020-10-21 | DRG: 641 | Disposition: A | Discharge status: Home / Self Care | Payer: Medicare PPO | Source: Ambulatory Visit | Attending: Internal Medicine | Admitting: Internal Medicine

## 2020-10-15 ENCOUNTER — Encounter: Payer: Self-pay | Admitting: Hematology and Oncology

## 2020-10-15 ENCOUNTER — Inpatient Hospital Stay (HOSPITAL_BASED_OUTPATIENT_CLINIC_OR_DEPARTMENT_OTHER): Payer: Medicare PPO | Admitting: Medical

## 2020-10-15 ENCOUNTER — Other Ambulatory Visit: Payer: Self-pay | Admitting: Medical

## 2020-10-15 ENCOUNTER — Encounter (HOSPITAL_COMMUNITY): Payer: Self-pay | Admitting: Family Medicine

## 2020-10-15 VITALS — BP 128/77 | HR 75 | Temp 98.2°F | Resp 18 | Ht 68.0 in | Wt 157.9 lb

## 2020-10-15 DIAGNOSIS — C8333 Diffuse large B-cell lymphoma, intra-abdominal lymph nodes: Secondary | ICD-10-CM | POA: Diagnosis not present

## 2020-10-15 DIAGNOSIS — Z8249 Family history of ischemic heart disease and other diseases of the circulatory system: Secondary | ICD-10-CM | POA: Diagnosis not present

## 2020-10-15 DIAGNOSIS — N183 Chronic kidney disease, stage 3 unspecified: Secondary | ICD-10-CM | POA: Diagnosis not present

## 2020-10-15 DIAGNOSIS — E860 Dehydration: Secondary | ICD-10-CM

## 2020-10-15 DIAGNOSIS — Z9221 Personal history of antineoplastic chemotherapy: Secondary | ICD-10-CM | POA: Diagnosis not present

## 2020-10-15 DIAGNOSIS — Z95828 Presence of other vascular implants and grafts: Secondary | ICD-10-CM

## 2020-10-15 DIAGNOSIS — Z8673 Personal history of transient ischemic attack (TIA), and cerebral infarction without residual deficits: Secondary | ICD-10-CM

## 2020-10-15 DIAGNOSIS — M519 Unspecified thoracic, thoracolumbar and lumbosacral intervertebral disc disorder: Secondary | ICD-10-CM | POA: Diagnosis present

## 2020-10-15 DIAGNOSIS — Z6824 Body mass index (BMI) 24.0-24.9, adult: Secondary | ICD-10-CM

## 2020-10-15 DIAGNOSIS — R0902 Hypoxemia: Secondary | ICD-10-CM | POA: Diagnosis not present

## 2020-10-15 DIAGNOSIS — R0602 Shortness of breath: Secondary | ICD-10-CM

## 2020-10-15 DIAGNOSIS — K219 Gastro-esophageal reflux disease without esophagitis: Secondary | ICD-10-CM | POA: Diagnosis present

## 2020-10-15 DIAGNOSIS — Z8616 Personal history of COVID-19: Secondary | ICD-10-CM

## 2020-10-15 DIAGNOSIS — R7989 Other specified abnormal findings of blood chemistry: Secondary | ICD-10-CM | POA: Diagnosis not present

## 2020-10-15 DIAGNOSIS — I129 Hypertensive chronic kidney disease with stage 1 through stage 4 chronic kidney disease, or unspecified chronic kidney disease: Secondary | ICD-10-CM | POA: Diagnosis not present

## 2020-10-15 DIAGNOSIS — E785 Hyperlipidemia, unspecified: Secondary | ICD-10-CM | POA: Diagnosis not present

## 2020-10-15 DIAGNOSIS — Z20822 Contact with and (suspected) exposure to covid-19: Secondary | ICD-10-CM | POA: Diagnosis not present

## 2020-10-15 DIAGNOSIS — C8590 Non-Hodgkin lymphoma, unspecified, unspecified site: Secondary | ICD-10-CM | POA: Diagnosis not present

## 2020-10-15 DIAGNOSIS — Z79899 Other long term (current) drug therapy: Secondary | ICD-10-CM

## 2020-10-15 DIAGNOSIS — R634 Abnormal weight loss: Secondary | ICD-10-CM | POA: Diagnosis present

## 2020-10-15 DIAGNOSIS — R918 Other nonspecific abnormal finding of lung field: Secondary | ICD-10-CM | POA: Diagnosis present

## 2020-10-15 DIAGNOSIS — Z87442 Personal history of urinary calculi: Secondary | ICD-10-CM | POA: Diagnosis not present

## 2020-10-15 DIAGNOSIS — M1990 Unspecified osteoarthritis, unspecified site: Secondary | ICD-10-CM | POA: Diagnosis present

## 2020-10-15 DIAGNOSIS — K5900 Constipation, unspecified: Secondary | ICD-10-CM | POA: Diagnosis present

## 2020-10-15 DIAGNOSIS — K649 Unspecified hemorrhoids: Secondary | ICD-10-CM | POA: Diagnosis present

## 2020-10-15 DIAGNOSIS — R9431 Abnormal electrocardiogram [ECG] [EKG]: Secondary | ICD-10-CM | POA: Diagnosis not present

## 2020-10-15 DIAGNOSIS — D039 Melanoma in situ, unspecified: Secondary | ICD-10-CM | POA: Diagnosis not present

## 2020-10-15 DIAGNOSIS — N281 Cyst of kidney, acquired: Secondary | ICD-10-CM | POA: Diagnosis not present

## 2020-10-15 DIAGNOSIS — E440 Moderate protein-calorie malnutrition: Secondary | ICD-10-CM | POA: Diagnosis present

## 2020-10-15 DIAGNOSIS — C8330 Diffuse large B-cell lymphoma, unspecified site: Secondary | ICD-10-CM | POA: Diagnosis not present

## 2020-10-15 DIAGNOSIS — N179 Acute kidney failure, unspecified: Secondary | ICD-10-CM | POA: Diagnosis present

## 2020-10-15 DIAGNOSIS — R059 Cough, unspecified: Secondary | ICD-10-CM

## 2020-10-15 DIAGNOSIS — R944 Abnormal results of kidney function studies: Secondary | ICD-10-CM | POA: Diagnosis not present

## 2020-10-15 LAB — CMP (CANCER CENTER ONLY)
ALT: 16 U/L (ref 0–44)
AST: 39 U/L (ref 15–41)
Albumin: 3.3 g/dL — ABNORMAL LOW (ref 3.5–5.0)
Alkaline Phosphatase: 53 U/L (ref 38–126)
Anion gap: 7 (ref 5–15)
BUN: 45 mg/dL — ABNORMAL HIGH (ref 8–23)
CO2: 28 mmol/L (ref 22–32)
Calcium: 14.8 mg/dL (ref 8.9–10.3)
Chloride: 104 mmol/L (ref 98–111)
Creatinine: 2.08 mg/dL — ABNORMAL HIGH (ref 0.61–1.24)
GFR, Estimated: 33 mL/min — ABNORMAL LOW (ref >60)
Glucose, Bld: 107 mg/dL — ABNORMAL HIGH (ref 70–99)
Potassium: 4.1 mmol/L (ref 3.5–5.1)
Sodium: 139 mmol/L (ref 135–145)
Total Bilirubin: 0.4 mg/dL (ref 0.3–1.2)
Total Protein: 5.7 g/dL — ABNORMAL LOW (ref 6.5–8.1)

## 2020-10-15 LAB — SARS CORONAVIRUS 2 (TAT 6-24 HRS): SARS Coronavirus 2: NEGATIVE

## 2020-10-15 LAB — CBC WITH DIFFERENTIAL (CANCER CENTER ONLY)
Abs Immature Granulocytes: 0.03 10*3/uL (ref 0.00–0.07)
Basophils Absolute: 0.1 10*3/uL (ref 0.0–0.1)
Basophils Relative: 1 %
Eosinophils Absolute: 0.1 10*3/uL (ref 0.0–0.5)
Eosinophils Relative: 1 %
HCT: 30.2 % — ABNORMAL LOW (ref 39.0–52.0)
Hemoglobin: 9.2 g/dL — ABNORMAL LOW (ref 13.0–17.0)
Immature Granulocytes: 0 %
Lymphocytes Relative: 20 %
Lymphs Abs: 2.3 10*3/uL (ref 0.7–4.0)
MCH: 27.6 pg (ref 26.0–34.0)
MCHC: 30.5 g/dL (ref 30.0–36.0)
MCV: 90.7 fL (ref 80.0–100.0)
Monocytes Absolute: 1.1 10*3/uL — ABNORMAL HIGH (ref 0.1–1.0)
Monocytes Relative: 9 %
Neutro Abs: 8 10*3/uL — ABNORMAL HIGH (ref 1.7–7.7)
Neutrophils Relative %: 69 %
Platelet Count: 319 10*3/uL (ref 150–400)
RBC: 3.33 MIL/uL — ABNORMAL LOW (ref 4.22–5.81)
RDW: 18.4 % — ABNORMAL HIGH (ref 11.5–15.5)
WBC Count: 11.7 10*3/uL — ABNORMAL HIGH (ref 4.0–10.5)
nRBC: 0 % (ref 0.0–0.2)

## 2020-10-15 LAB — LACTATE DEHYDROGENASE: LDH: 239 U/L — ABNORMAL HIGH (ref 98–192)

## 2020-10-15 LAB — PHOSPHORUS: Phosphorus: 4.3 mg/dL (ref 2.5–4.6)

## 2020-10-15 MED ORDER — FLUTICASONE PROPIONATE 50 MCG/ACT NA SUSP: 1.0000 | Freq: Every day | NASAL | Status: DC | PRN

## 2020-10-15 MED ORDER — ACETAMINOPHEN 325 MG PO TABS: 650.0000 mg | ORAL_TABLET | Freq: Four times a day (QID) | ORAL | Status: DC | PRN

## 2020-10-15 MED ORDER — HYDROCORTISONE 2.5 % RE CREA: 1.0000 "application " | TOPICAL_CREAM | Freq: Every day | RECTAL | Status: DC | PRN

## 2020-10-15 MED ORDER — SODIUM CHLORIDE 0.9 % IV SOLN: INTRAVENOUS | Status: DC

## 2020-10-15 MED ORDER — SENNOSIDES-DOCUSATE SODIUM 8.6-50 MG PO TABS: 2.0000 | ORAL_TABLET | Freq: Two times a day (BID) | ORAL | Status: DC | PRN

## 2020-10-15 MED ORDER — SODIUM CHLORIDE 0.9% FLUSH
10.0000 mL | INTRAVENOUS | Status: DC | PRN
  Administered 2020-10-15: 10 mL via INTRAVENOUS
  Filled 2020-10-15: qty 10

## 2020-10-15 MED ORDER — HEPARIN SOD (PORK) LOCK FLUSH 100 UNIT/ML IV SOLN
500.0000 [IU] | Freq: Once | INTRAVENOUS | Status: AC
  Administered 2020-10-15: 500 [IU] via INTRAVENOUS
  Filled 2020-10-15: qty 5

## 2020-10-15 MED ORDER — POLYVINYL ALCOHOL 1.4 % OP SOLN: 1.0000 [drp] | OPHTHALMIC | Status: DC | PRN

## 2020-10-15 MED ORDER — ENSURE ENLIVE PO LIQD
237.0000 mL | Freq: Two times a day (BID) | ORAL | Status: DC
  Administered 2020-10-15 – 2020-10-21 (×10): 237 mL via ORAL

## 2020-10-15 MED ORDER — ENOXAPARIN SODIUM 30 MG/0.3ML ~~LOC~~ SOLN
30.0000 mg | SUBCUTANEOUS | Status: DC
  Administered 2020-10-15: 30 mg via SUBCUTANEOUS
  Filled 2020-10-15: qty 0.3

## 2020-10-15 MED ORDER — SODIUM CHLORIDE 0.9 % IV SOLN
INTRAVENOUS | Status: DC
  Filled 2020-10-15 (×2): qty 250

## 2020-10-15 MED ORDER — POLYETHYLENE GLYCOL 3350 17 G PO PACK: 17.0000 g | PACK | Freq: Every day | ORAL | Status: DC | PRN

## 2020-10-15 NOTE — H&P (Signed)
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 Arthritis   . Chronic kidney disease (CKD), stage III (moderate) (HCC)   . COVID-19   . GERD (gastroesophageal reflux disease)    occ  . Hemorrhoids   . History of ETT 3/05   low risk  . History of hiatal hernia   . History of kidney stones   . HLD (hyperlipidemia)   . HTN (hypertension)   . Left inguinal hernia   . Melanoma ([place])    excied  . 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 Stroke [facility]) 2011   tia   . TIA (transient ischemic attack) 4/11   associated w slurred speecha ndmild facial droop. lasted for about 10 minutes. Had MRI, etc with [facility] that per his report was ok (dr. Stephannie Li). Echo (4/11): EF 55-60%, no regional WMAs, normal diastolic function, mild LAE, no source of embolus   Past  Surgical History:  Procedure Laterality Date  . BMI 30.2 muscular  12/08/04  . curretage actinic keratosis R ear  01/20/05  . ETT low prob of CAD  11/21/03  . excision melanoma in situ back  01/20/05  . hemorrhoids sclerosed at colonoscopy  12/19/05  . IR IMAGING GUIDED PORT INSERTION  05/19/2020  . LAPAROSCOPIC INGUINAL HERNIA REPAIR Left 12/20/1995  . LUMBAR DISC SURGERY    . NERVE REPAIR     back of neck  . retinal laser surgery Left 09/20/1986  . SEPTOPLASTY  11/19/98  . TOTAL KNEE ARTHROPLASTY Left 12/13/2016   Procedure: TOTAL KNEE ARTHROPLASTY WITH RIGHT KNEE CORTISONE INJECTION;  Surgeon: Frederik Pear, MD;  Location: [place];  Service: Orthopedics;  Laterality: Left;  . uric acid 6.9  12/22/04  . x-ray l great toe  07/21/00    reports that he has never smoked. He has never used smokeless tobacco. He reports that he does not drink alcohol and does not use drugs.  Mobility: Independent still drives very active  No Known Allergies Family History  Problem Relation Age of Onset  . Liver cancer Sister   . Heart failure Brother 44       CABGx4  . Heart attack Mother 42  . Uterine cancer Mother   . Heart attack Father 12  . Asthma Son   . Diabetes Brother   . Prostate cancer Brother  Prior to Admission medications   Medication Sig Start Date End Date Taking? Authorizing Provider  acetaminophen (TYLENOL) 325 MG tablet Take 2 tablets (650 mg total) by mouth every 6 (six) hours as needed for mild pain, moderate pain or fever. Patient not taking: No sig reported 06/20/20   Maryanna Shape, NP  feeding supplement, ENSURE ENLIVE, (ENSURE ENLIVE) LIQD Take 237 mLs by mouth 2 (two) times daily between meals. 06/20/20   Maryanna Shape, NP  fluticasone (FLONASE) 50 MCG/ACT nasal spray Place 1-2 sprays into both nostrils daily as needed for allergies or rhinitis. 08/22/20   Maryanna Shape, NP  hydrocortisone (ANUSOL-HC) 2.5 % rectal cream Place 1 application rectally daily as needed. Patient not  taking: Reported on 09/24/2020 05/30/20   Maryanna Shape, NP  hydrocortisone 2.5 % cream  01/29/19   [provider]  ibuprofen (ADVIL) 200 MG tablet     [provider]  losartan (COZAAR) 25 MG tablet Take 25 mg by mouth daily.  04/09/20   [provider]  Multiple Vitamin (MULTI VITAMIN) TABS     [provider]  polyethylene glycol (MIRALAX / GLYCOLAX) 17 g packet Take 17 g by mouth daily as needed for moderate constipation. 05/30/20   Maryanna Shape, NP  polyvinyl alcohol (LIQUIFILM TEARS) 1.4 % ophthalmic solution Place 1 drop into both eyes as needed for dry eyes.    [provider]  rosuvastatin (CRESTOR) 5 MG tablet Take 5 mg by mouth daily.    [provider]  senna-docusate (SENOKOT-S) 8.6-50 MG tablet Take 2 tablets by mouth 2 (two) times daily as needed for mild constipation. 05/30/20   Maryanna Shape, NP    Physical Exam:  Vitals:   10/15/20 1526  BP: (!) 123/56  Pulse: (!) 57  Resp: 17  Temp: 97.8 F (36.6 C)  SpO2: (!) 89%     Awake alert coherent no distress EOMI NCAT looks younger than stated age  Neck soft supple no appreciable lymphadenopathy in submandibular submental region throat clear  Anicteric no pallor  S1-S2 no murmur no rub no gallop  Chest clear no rales rhonchi  Abdomen soft nontender  No lower extremity edema  Neurologically intact power 5/5 grossly rest deferred  Psych euthymic very pleasant  I have personally reviewed following labs and imaging studies  Labs:   Baseline BUN/creatinine 16/1.1-->45/2.08  Calcium up from 8.9 at baseline to 14.8  White count 11.7  Platelet 319  Covid test negative  Imaging studies:   None yet  Medical tests:   EKG independently reviewed: None  Test discussed with performing physician:  No  Decision to obtain old records:   Yes  Review and summation of old records:   Yes  Active Problems:   * No active hospital problems.  *   Assessment/Plan AKI/hypercalcemia Both may be related-patient also on supplements at home but does not take any over-the-counter meds Obtain PT H, PT HRP, ionized calcium, vitamin D, phosphorus Start saline 75 cc an hour Do not give Lasix as does not appear volume overloaded We will give consideration for bisphosphonate if next labs shows calcium still markedly elevated I will CC Dr. Lorenso Courier to let him know this patient is admitted Diffuse large cell B cell lymphoma Last Rx 09/08/2020-further treatment as per Dr. Lorenso Courier HTN Hold losartan as may have contributed to AKI will need to reassess trends in a.m. Hemorrhoids Continue Anusol Prior TIA Aspirin and secondary prevention   Severity of  Illness: The appropriate patient status for this patient is INPATIENT. Inpatient status is judged to be reasonable and necessary in order to provide the required intensity of service to ensure the patient's safety. The patient's presenting symptoms, physical exam findings, and initial radiographic and laboratory data in the context of their chronic comorbidities is felt to place them at high risk for further clinical deterioration. Furthermore, it is not anticipated that the patient will be medically stable for discharge from the hospital within 2 midnights of admission. The following factors support the patient status of inpatient.   " The patient's presenting symptoms include weakness. " The worrisome physical exam findings include none. " The initial radiographic and laboratory data are worrisome because of high calcium. " The chronic co-morbidities include cancer.   * I certify that at the point of admission it is my clinical judgment that the patient will require inpatient hospital care spanning beyond 2 midnights from the point of admission due to high intensity of service, high risk for further deterioration and high frequency of surveillance required.*    DVT prophylaxis: Lovenox Code  Status: Full Family Communication: Discussed with wife [name] Consults called: Informed oncology no formal consult placed  Time spent: 55 minutes  Verlon Au, MD [days-call my NP partners at night for Care related issues] [facility] --[facility]ia [employer] [facility] , [email or web address]; password [facility]  10/15/2020, 3:37 PM

## 2020-10-15 NOTE — Telephone Encounter (Signed)
Critical for Calcium 14.8. Dr.Dorsey desk nurse Bertis Ruddy made aware.

## 2020-10-15 NOTE — Progress Notes (Signed)
Received confirmation of a room for patient-[id number]. Report called to [place], Therapist, sports. Pt has port accessed with sorba- view dressing. IV fluids detached prior to transport.Transported via wheelchair with wife in attendance.

## 2020-10-15 NOTE — Patient Instructions (Signed)

## 2020-10-16 ENCOUNTER — Telehealth: Payer: Self-pay | Admitting: Hematology and Oncology

## 2020-10-16 LAB — COMPREHENSIVE METABOLIC PANEL
ALT: 15 U/L (ref 0–44)
AST: 26 U/L (ref 15–41)
Albumin: 2.9 g/dL — ABNORMAL LOW (ref 3.5–5.0)
Alkaline Phosphatase: 42 U/L (ref 38–126)
Anion gap: 6 (ref 5–15)
BUN: 42 mg/dL — ABNORMAL HIGH (ref 8–23)
CO2: 27 mmol/L (ref 22–32)
Calcium: 13.3 mg/dL (ref 8.9–10.3)
Chloride: 107 mmol/L (ref 98–111)
Creatinine, Ser: 1.84 mg/dL — ABNORMAL HIGH (ref 0.61–1.24)
GFR, Estimated: 38 mL/min — ABNORMAL LOW (ref >60)
Glucose, Bld: 105 mg/dL — ABNORMAL HIGH (ref 70–99)
Potassium: 4 mmol/L (ref 3.5–5.1)
Sodium: 140 mmol/L (ref 135–145)
Total Bilirubin: 0.5 mg/dL (ref 0.3–1.2)
Total Protein: 5.1 g/dL — ABNORMAL LOW (ref 6.5–8.1)

## 2020-10-16 LAB — VITAMIN D 25 HYDROXY (VIT D DEFICIENCY, FRACTURES): Vit D, 25-Hydroxy: 42.11 ng/mL (ref 30–100)

## 2020-10-16 LAB — PTH, INTACT AND CALCIUM
Calcium, Total (PTH): 12.9 mg/dL — ABNORMAL HIGH (ref 8.6–10.2)
PTH: 9 pg/mL — ABNORMAL LOW (ref 15–65)

## 2020-10-16 LAB — CORTISOL-AM, BLOOD: Cortisol - AM: 12.3 ug/dL (ref 6.7–22.6)

## 2020-10-16 MED ORDER — CHLORHEXIDINE GLUCONATE CLOTH 2 % EX PADS
6.0000 | MEDICATED_PAD | Freq: Every day | CUTANEOUS | Status: DC
  Administered 2020-10-16 – 2020-10-21 (×6): 6 via TOPICAL

## 2020-10-16 MED ORDER — SENNOSIDES-DOCUSATE SODIUM 8.6-50 MG PO TABS
2.0000 | ORAL_TABLET | Freq: Two times a day (BID) | ORAL | Status: DC
  Administered 2020-10-16 – 2020-10-18 (×5): 2 via ORAL
  Filled 2020-10-16 (×8): qty 2

## 2020-10-16 MED ORDER — ENOXAPARIN SODIUM 40 MG/0.4ML ~~LOC~~ SOLN
40.0000 mg | SUBCUTANEOUS | Status: DC
  Administered 2020-10-16 – 2020-10-20 (×5): 40 mg via SUBCUTANEOUS
  Filled 2020-10-16 (×5): qty 0.4

## 2020-10-16 MED ORDER — POLYETHYLENE GLYCOL 3350 17 G PO PACK
17.0000 g | PACK | Freq: Every day | ORAL | Status: DC
  Administered 2020-10-16: 17 g via ORAL
  Filled 2020-10-16: qty 1

## 2020-10-16 NOTE — Telephone Encounter (Signed)
Per 12/6 los, no changes made to pt schedule  °

## 2020-10-16 NOTE — Progress Notes (Addendum)
PROGRESS NOTE    Connor Adams  LOV:[id number] DOB: [birth year] DOA: 10/15/2020 PCP: Shirline Frees, MD    Brief Narrative: 75 year old male with history of diffuse large B-cell lymphoma followed by Dr. Lorenso Courier on chemotherapy.  He finished his last chemo in December 2021 and follow-up PET scan showed remission. He was admitted with generalized weakness poor appetite poor p.o. intake constipation dyspnea on exertion.  He was found to have hypercalcemia and was admitted for further management.  Assessment & Plan:   Active Problems:   Hypercalcemia  #1 hypercalcemia-? Etiology   Oncology does not think it is related to malignancy since he is felt to be in remission after chemo with PET scan showing much improved. Calcium down to 13.3 from 14.8.  Continue IV fluids. Patient gives history of consuming a lot of milk at home. However his total PTH is 12.9 which is elevated and intact PTH is low. NL cortisol am Check vitamin D level  #2 AKI due to decreased p.o. intake dehydration improving creatinine on IV fluids.  #3 hypertension continue current meds  #4 history of TIA continue home meds  #5 constipation we will start MiraLAX.   Estimated body mass index is 24.45 kg/m as calculated from the following:   Height as of this encounter: 5\' 8"  (1.727 m).   Weight as of this encounter: 72.9 kg.  DVT prophylaxis: Lovenox Code Status: Full code Family Communication: None at bedside  disposition Plan:  Status is: Inpatient  Dispo: The patient is from: Home              Anticipated d/c is to: Home              Anticipated d/c date is: 1 day              Patient currently is not medically stable to d/c.   Difficult to place patient na  Consultants:   onc  Procedures:none Antimicrobials:none  Subjective: He is resting in bed complaining of some constipation otherwise no other new complaints  Objective: Vitals:   10/15/20 2324 10/16/20 0331 10/16/20 0939 10/16/20 0941   BP: (!) 112/58 138/72    Pulse: 62 69    Resp: 16 16    Temp: 98.3 F (36.8 C) 99.1 F (37.3 C)    TempSrc: Oral Oral    SpO2: 91% 90% (!) 82% 93%  Weight:      Height:        Intake/Output Summary (Last 24 hours) at 10/16/2020 1231 Last data filed at 10/16/2020 0900 Gross per 24 hour  Intake 1305.98 ml  Output --  Net 1305.98 ml   Filed Weights   10/15/20 1527  Weight: 72.9 kg    Examination:  General exam: Appears calm and comfortable  Respiratory system: Clear to auscultation. Respiratory effort normal. Cardiovascular system: S1 & S2 heard, RRR. No JVD, murmurs, rubs, gallops or clicks. No pedal edema. Gastrointestinal system: Abdomen is nondistended, soft and nontender. No organomegaly or masses felt. Normal bowel sounds heard. Central nervous system: Alert and oriented. No focal neurological deficits. Extremities: Symmetric 5 x 5 power. Skin: No rashes, lesions or ulcers Psychiatry: Judgement and insight appear normal. Mood & affect appropriate.     Data Reviewed: I have personally reviewed following labs and imaging studies  CBC: Recent Labs  Lab 10/15/20 0820  WBC 11.7*  NEUTROABS 8.0*  HGB 9.2*  HCT 30.2*  MCV 90.7  PLT 884   Basic Metabolic Panel: Recent Labs  Lab  10/15/20 0820 10/15/20 1650 10/16/20 0519  NA 139  --  140  K 4.1  --  4.0  CL 104  --  107  CO2 28  --  27  GLUCOSE 107*  --  105*  BUN 45*  --  42*  CREATININE 2.08*  --  1.84*  CALCIUM 14.8* 12.9* 13.3*  PHOS  --  4.3  --    GFR: Estimated Creatinine Clearance: 34.1 mL/min (A) (by C-G formula based on SCr of 1.84 mg/dL (H)). Liver Function Tests: Recent Labs  Lab 10/15/20 0820 10/16/20 0519  AST 39 26  ALT 16 15  ALKPHOS 53 42  BILITOT 0.4 0.5  PROT 5.7* 5.1*  ALBUMIN 3.3* 2.9*   No results for input(s): LIPASE, AMYLASE in the last 168 hours. No results for input(s): AMMONIA in the last 168 hours. Coagulation Profile: No results for input(s): INR, PROTIME in the  last 168 hours. Cardiac Enzymes: No results for input(s): CKTOTAL, CKMB, CKMBINDEX, TROPONINI in the last 168 hours. BNP (last 3 results) No results for input(s): PROBNP in the last 8760 hours. HbA1C: No results for input(s): HGBA1C in the last 72 hours. CBG: No results for input(s): GLUCAP in the last 168 hours. Lipid Profile: No results for input(s): CHOL, HDL, LDLCALC, TRIG, CHOLHDL, LDLDIRECT in the last 72 hours. Thyroid Function Tests: No results for input(s): TSH, T4TOTAL, FREET4, T3FREE, THYROIDAB in the last 72 hours. Anemia Panel: No results for input(s): VITAMINB12, FOLATE, FERRITIN, TIBC, IRON, RETICCTPCT in the last 72 hours. Sepsis Labs: No results for input(s): PROCALCITON, LATICACIDVEN in the last 168 hours.  Recent Results (from the past 240 hour(s))  SARS Coronavirus 2 (TAT 6-24 hrs)     Status: None   Collection Time: 10/15/20  9:40 AM   Specimen: Nasopharyngeal Swab  Result Value Ref Range Status   SARS Coronavirus 2 NEGATIVE NEGATIVE Final    Comment: (NOTE) SARS-CoV-2 target nucleic acids are NOT DETECTED.  The SARS-CoV-2 RNA is generally detectable in upper and lower respiratory specimens during the acute phase of infection. Negative results do not preclude SARS-CoV-2 infection, do not rule out co-infections with other pathogens, and should not be used as the sole basis for treatment or other patient management decisions. Negative results must be combined with clinical observations, patient history, and epidemiological information. The expected result is Negative.  Fact Sheet for Patients: SugarRoll.be  Fact Sheet for Healthcare Providers: https://www.[employer].com/  This test is not yet approved or cleared by the [place] FDA and  has been authorized for detection and/or diagnosis of SARS-CoV-2 by FDA under an Emergency Use Authorization (EUA). This EUA will remain  in effect (meaning this test can  be used) for the duration of the COVID-19 declaration under Se ction 564(b)(1) of the Act, 21 U.S.C. section 360bbb-3(b)(1), unless the authorization is terminated or revoked sooner.  Performed at [facility], [place] [address], [place], [place] [address]          Radiology Studies: No results found.      Scheduled Meds: . Chlorhexidine Gluconate Cloth  6 each Topical Daily  . enoxaparin (LOVENOX) injection  40 mg Subcutaneous Q24H  . feeding supplement  237 mL Oral BID BM  . polyethylene glycol  17 g Oral Daily  . senna-docusate  2 tablet Oral BID   Continuous Infusions: . sodium chloride 100 mL/hr at 10/16/20 1140     LOS: 1 day   Georgette Shell, MD  10/16/2020, 12:31 PM

## 2020-10-16 NOTE — Progress Notes (Signed)
Mr. Fanfan was seen per the request of Dr. Narda Rutherford for COVID-19 testing prior to a hospital admission.  A sample was collected and submitted.  Sandi Mealy, MHS, PA-C Physician Assistant

## 2020-10-16 NOTE — Progress Notes (Addendum)
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   1) 04/17/2020: CT A/P showed dominant nodal mass(11.5 x 10.9 cm)within the jejunal mesentery, highly suspicious for lymphoma. Additionally there are left lower lobe pleural-based pulmonary nodules 2) 04/24/2020: establish care with Dr. Lorenso Courier 3) 04/28/2020:  PET CT scan shows Large solid left mesenteric mass 13.0 cm with maximum SUV of 21.2, Deauville 5 with local hypermetabolic porta hepatis and retroperitoneal lymph nodes. Constitutes bulky Stage I disease.  4) 05/27/2020-05/31/2020: Cycle 1 of R-EPOCH 5) 06/16/2020-06/23/2020: Cycle 2 of R-EPOCH 6) 07/07/2020-07/11/2020: Cycle 3 of R-EPOCH  7) 07/28/2020-08/01/2020: Cycle 4 of R-EPOCH 8) 08/18/2020-08/22/2020: Cycle 5 of R-EPOCH 9) 09/08/2020-09/12/2020: Cycle 6 of R-EPOCH 10) 10/06/2020: PET CT scan shows decreased size of dominant mesenteric mass, however there was hypermetabolic activity within a single left anterior mesenteric lymph node   SUBJECTIVE: Connor Adams was seen in our office yesterday for routine follow-up and was having difficulty with decreased energy, generalized weakness, cough, runny nose.  He had also lost about 20 pounds since his last visit with [place].  He was having taste changes.  Labs in our office showed a calcium of 4.8, creatinine 2.08, hemoglobin 9.2, and WBC 11.1.  He was sent to the hospital due to weakness, AKI and hypercalcemia.  The patient was seen this morning.  His wife is at the bedside.  Reports that he feels better overall but reports ongoing weakness in his arms and legs.  He was able to eat breakfast but did not eat much of it because it was cold.  He tells me that he has not had any nausea, vomiting, diarrhea.  Has not had a bowel movement in 2 days.  He has needed to use increased stool softeners and  laxatives at home.  No confusion reported. The patient tells me that he was drinking about a gallon of milk every 4 days.  Of note, the patient drank 2 milk cartons with breakfast this morning which were 240 mL each.  The patient denies taking calcium supplementation including Tums at home.  Oncology History  DLBCL (diffuse large B cell lymphoma) ([place])  05/24/2020 Initial Diagnosis   DLBCL (diffuse large B cell lymphoma) ([place])   05/27/2020 -  Chemotherapy   The patient had dexamethasone (DECADRON) 4 MG tablet, 8 mg, Oral, 2 times daily with meals, 1 of 1 cycle, Start date: --, End date: -- pegfilgrastim-cbqv (UDENYCA) injection 6 mg, 6 mg, Subcutaneous, Once, 6 of 6 cycles Administration: 6 mg (06/02/2020), 6 mg (06/23/2020), 6 mg (07/14/2020), 6 mg (08/04/2020), 6 mg (08/25/2020), 6 mg (09/15/2020) DOXOrubicin (ADRIAMYCIN) 20 mg, etoposide (VEPESID) 96 mg, vinCRIStine (ONCOVIN) 0.8 mg in sodium chloride 0.9 % 1,000 mL chemo infusion, , Intravenous, Once, 6 of 6 cycles Administration:  (05/27/2020),  (05/28/2020),  (06/16/2020),  (06/17/2020),  (05/29/2020),  (05/30/2020),  (06/18/2020),  (06/19/2020),  (07/07/2020),  (07/08/2020),  (07/09/2020),  (07/10/2020),  (07/28/2020),  (07/29/2020),  (08/18/2020),  (08/19/2020),  (08/20/2020),  (08/21/2020),  (09/08/2020),  (09/09/2020),  (09/10/2020),  (09/11/2020),  (07/30/2020),  (07/31/2020) ondansetron (ZOFRAN) 8 mg, dexamethasone (DECADRON) 10 mg in sodium chloride 0.9 % 50 mL IVPB, , Intravenous,  Once, 6 of 6 cycles Administration: 8 mg (05/27/2020), 18 mg (05/28/2020), 16 mg (05/31/2020), 18 mg (06/16/2020), 18 mg (06/17/2020), 16 mg (06/20/2020), 18 mg (05/29/2020), 18 mg (05/30/2020), 18 mg (06/18/2020), 8 mg (06/19/2020), 18 mg (07/07/2020), 18  mg (07/08/2020), 36 mg (07/11/2020), 18 mg (07/09/2020), 8 mg (07/10/2020), 18 mg (07/28/2020), 18 mg (07/29/2020), 36 mg (08/01/2020), 8 mg (08/18/2020), 8 mg (08/19/2020), 18 mg (08/20/2020), 18 mg (08/21/2020), 8 mg (08/22/2020), 8 mg  (09/08/2020), 8 mg (09/09/2020), 8 mg (09/10/2020), 8 mg (09/11/2020), 8 mg (09/12/2020), 18 mg (07/30/2020), 18 mg (07/31/2020) cyclophosphamide (CYTOXAN) 1,440 mg in sodium chloride 0.9 % 250 mL chemo infusion, 750 mg/m2 = 1,440 mg, Intravenous,  Once, 6 of 6 cycles Dose modification: 900 mg/m2 (original dose 750 mg/m2, Cycle 4, Reason: Provider Judgment), 1,080 mg/m2 (original dose 750 mg/m2, Cycle 5, Reason: Provider Judgment), 900 mg/m2 (original dose 750 mg/m2, Cycle 6, Reason: Provider Judgment) Administration: 1,440 mg (05/31/2020), 1,440 mg (06/20/2020), 1,440 mg (07/11/2020), 1,740 mg (08/01/2020), 2,000 mg (08/22/2020), 1,740 mg (09/12/2020) riTUXimab-pvvr (RUXIENCE) 700 mg in sodium chloride 0.9 % 250 mL (2.1875 mg/mL) infusion, 375 mg/m2 = 700 mg (100 % of original dose 375 mg/m2), Intravenous,  Once, 1 of 1 cycle Dose modification: 375 mg/m2 (original dose 375 mg/m2, Cycle 2) Administration: 700 mg (06/23/2020) riTUXimab-abbs (TRUXIMA) 700 mg in sodium chloride 0.9 % 250 mL (2.1875 mg/mL) infusion, 375 mg/m2 = 700 mg (100 % of original dose 375 mg/m2), Intravenous,  Once, 1 of 1 cycle Dose modification: 375 mg/m2 (original dose 375 mg/m2, Cycle 1) Administration: 700 mg (06/02/2020)  for chemotherapy treatment.       REVIEW OF SYSTEMS:   Constitutional: Denies fevers, chills.  Reports generalized weakness and a weight loss of about 20 pounds. Eyes: Denies blurriness of vision Ears, nose, mouth, throat, and face: Denies mucositis or sore throat Respiratory: Denies cough, dyspnea or wheezes Cardiovascular: Denies palpitation, chest discomfort Gastrointestinal:  Denies nausea, heartburn or change in bowel habits Skin: Denies abnormal skin rashes Lymphatics: Denies new lymphadenopathy or easy bruising Neurological:Denies numbness, tingling or new weaknesses Behavioral/Psych: Mood is stable, no new changes  Extremities: No lower extremity edema All other systems were reviewed with the  patient and are negative.  I have reviewed the past medical history, past surgical history, social history and family history with the patient and they are unchanged from previous note.   PHYSICAL EXAMINATION: ECOG PERFORMANCE STATUS: 2 - Symptomatic, <50% confined to bed  Vitals:   10/16/20 0939 10/16/20 0941  BP:    Pulse:    Resp:    Temp:    SpO2: (!) 82% 93%   Filed Weights   10/15/20 1527  Weight: 72.9 kg    Intake/Output from previous day: 01/26 0701 - 01/27 0700 In: 1066 [I.V.:1066] Out: -   GENERAL:alert, no distress and comfortable SKIN: skin color, texture, turgor are normal, no rashes or significant lesions EYES: normal, Conjunctiva are pink and non-injected, sclera clear OROPHARYNX:no exudate, no erythema and lips, buccal mucosa, and tongue normal  LYMPH:  no palpable lymphadenopathy in the cervical, axillary or inguinal LUNGS: clear to auscultation and percussion with normal breathing effort HEART: regular rate & rhythm and no murmurs and no lower extremity edema ABDOMEN:abdomen soft, non-tender and normal bowel sounds Musculoskeletal: Strength symmetrical in the upper and lower extremities NEURO: alert & oriented x 3 with fluent speech, no focal motor/sensory deficits  LABORATORY DATA:  I have reviewed the data as listed CMP Latest Ref Rng & Units 10/16/2020 10/15/2020 10/15/2020  Glucose 70 - 99 mg/dL 161(W105(H) 960(A107(H) -  BUN 8 - 23 mg/dL 54(U42(H) 98(J45(H) -  Creatinine 0.61 - 1.24 mg/dL 1.91(Y1.84(H) 7.82(N2.08(H) -  Sodium 135 - 145 mmol/L 140 139 -  Potassium  3.5 - 5.1 mmol/L 4.0 4.1 -  Chloride 98 - 111 mmol/L 107 104 -  CO2 22 - 32 mmol/L 27 28 -  Calcium 8.9 - 10.3 mg/dL 13.3(HH) 12.9(H) 14.8(HH)  Total Protein 6.5 - 8.1 g/dL 5.1(L) 5.7(L) -  Total Bilirubin 0.3 - 1.2 mg/dL 0.5 0.4 -  Alkaline Phos 38 - 126 U/L 42 53 -  AST 15 - 41 U/L 26 39 -  ALT 0 - 44 U/L 15 16 -    Lab Results  Component Value Date   WBC 11.7 (H) 10/15/2020   HGB 9.2 (L) 10/15/2020   HCT  30.2 (L) 10/15/2020   MCV 90.7 10/15/2020   PLT 319 10/15/2020   NEUTROABS 8.0 (H) 10/15/2020    NM PET Image Restag (PS) Skull Base To Thigh  Result Date: 10/06/2020 CLINICAL DATA:  Subsequent treatment strategy for diffuse B-cell lymphoma. EXAM: NUCLEAR MEDICINE PET SKULL BASE TO THIGH TECHNIQUE: 8.4 mCi F-18 FDG was injected intravenously. Full-ring PET imaging was performed from the skull base to thigh after the radiotracer. CT data was obtained and used for attenuation correction and anatomic localization. Fasting blood glucose: 114 mg/dl COMPARISON:  PET-CT 08/15/2020 and 04/28/2020. FINDINGS: Mediastinal blood pool activity: SUV max 1.9 Liver activity: SUV max 3.2 NECK: No hypermetabolic cervical lymph nodes are identified.Asymmetrically increased metabolic activity along the right aspect of the vocal cord, similar to the most recent previous study (SUV max 7.7). This could be physiologic or relate to contralateral vocal cord paralysis. No other lesions of the pharyngeal mucosal space. Incidental CT findings: none CHEST: Mildly increased metabolic activity medially in the AP window, corresponding with a 1.1 cm short axis lymph node on image 74/4 (SUV max 4.4). No other hypermetabolic mediastinal, hilar or axillary lymph nodes. Interval improvement in the multifocal pulmonary nodularity which had progressed on the most recent study. There is associated mild hypermetabolic activity which has generally improved. Representative lesions include a 6 mm left upper lobe nodule on image 23/8 (SUV max 2.2), previously 8 mm and SUV max 4.9; 7 mm medial left upper lobe nodule on image 18/8 (SUV max 3.6), previously 9 mm and SUV max 4.8; and 7 mm right middle lobe nodule on image 42/8 (SUV max 1.4), previously 10 mm an SUV max 7.1. The patchy nodular airspace opacities in the left lower lobe have improved, likely inflammatory. No new or enlarging nodules. Incidental CT findings: Right IJ Port-A-Cath extends to the  mid right atrium. Mild atherosclerosis of the aorta, great vessels and coronary arteries. Stable small pericardial effusion. No significant pleural fluid. ABDOMEN/PELVIS: There is no hypermetabolic activity within the liver, adrenal glands, spleen or pancreas. The spleen is normal in size without hypermetabolic activity (SUV max 3.2). The dominant mesenteric mass has decreased in size, measuring approximately 5.8 x 3.8 cm on image 135/4 (previously 7.1 x 3.8 cm). This demonstrates metabolic activity similar to background bowel activity (SUV max 4.3). There is a persistent hypermetabolic left mesenteric node measuring 1.9 x 0.9 cm on image 142/4 (SUV max 5.5). This previously was less well-defined and smaller, measuring 1.7 x 0.8 cm with an SUV max of 3.8. No other hypermetabolic lymph nodes are seen. Incidental CT findings: Multiple nonobstructing renal calculi bilaterally. Calcified granulomas in the spleen and mild aortic atherosclerosis. SKELETON: There is no hypermetabolic activity to suggest osseous metastatic disease. The diffusely increased marrow uptake on the prior study has resolved, attributed to treatment related changes. 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 Healing fractures of the left 7th and 8th ribs posteriorly. IMPRESSION: 1. Further decreased size of dominant mesenteric mass with metabolic activity similar to background bowel (Deauville 3/4). 2. Increased size and increased hypermetabolic activity within a single left anterior mesenteric lymph node (Deauville 4/5). 3. Interval improvement in the size and metabolic activity of multiple previously demonstrated pulmonary nodules. It is uncertain as to whether these relate to the patient's lymphoma or superimposed inflammation/infection. 4. Single low level hypermetabolic activity within an AP window lymph node, probably reactive. 5. Similar asymmetric activity within the right aspect  of the glottis, possibly related to left cord paralysis. Correlate clinically. Electronically Signed   By: Richardean Sale M.D.   On: 10/06/2020 10:05    ASSESSMENT AND PLAN: Connor Adams 75 y.o. male with medical history significant for Stage I DLBCL who presents for a follow up visit.  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  At this time he has completed all planned chemotherapy treatment (R-EPOCH x 6 cycles).   On exam today Mr. Rack not his usual self.  He has hypercalcemia, elevation in creatinine, and leukocytosis.  Fortunately he tested negative for Covid.  We are having him admitted for further evaluation of this issue.  At this time I do not believe it is related to his cancer as his PET scan from a few days ago shows an excellent response to treatment.  IPI Score: 2 points, good prognosis/ low-intermediate risk group (81% OS, 80% PFS)  #Hypercalcemia #Acute Kidney Injury #Weakness --Labs in our office 10/15/2020 show a calcium 14.8, creatinine of 2.08.  Hemoglobin is at 9.2 and white blood cell count is elevated at 11.1. --patient is COVID negative, PET CT scan from last week shows good response to chemotherapy with no strong evidence of active disease.  --Etiology of the findings are concerning for either adrenal insufficiency or renal dysfunction.  Patient has been eating and drinking normally but has had a weight loss of 20 pounds recently and his energy levels have been low. --The patient is currently admitted to the hospital for work-up and treatment of his hypercalcemia and AKI --He is receiving IV fluids with improvement of his calcium down to 13.3 this morning and improvement of his creatinine down to 1.84 --Additional work-up pending including vitamin D and PTH levels.   --Advised the patient to avoid increased intake of dairy products --Cortisol level this morning was normal --I note the patient is taking an Ensure and I will ask the dietitian to consult to make recommendations regarding supplementation to try to decrease the amount of calcium in his diet --Appreciate the care of the hospitalist team.  We will continue to follow while in house.  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  --findings consistent with a double hit DLBCL Treatment of choice is R-EPOCH chemotherapy in the inpatient setting. --he is currently s/p Cycle 6 administered on 09/08/2020 --patient has a port in place. TTE was performed and showed strong baseline heart function. --repeat evaluation PET CT scan shows good response to chemotherapy.  --RTC pending disposition from the hospital  #Lung Nodules, resolved -- noted on interim PET CT scan after Cycle 4.  --unclear if these represent areas of infection, metastatic disease, or progression of lymphoma --pulmonology consulted,  appreciate their input.  --will monitor respiratory status closely --repeat PET CT in Jan 2022 showed resolution of the lesions  #Symptom Management --The patient is experiencing constipation likely due to hypercalcemia and I will change his MiraLAX and Senokot-S to scheduled dosing    LOS: 1 day   [name], DNP, AGPCNP-BC, AOCNP 10/16/20   I have read the above note and personally examined the patient. I agree with the assessment and plan as noted above.  Briefly Mr. Brester is a 75 year old male with medical history significant for double hit DLBCL who presents with hypercalcemia of unclear etiology. Agree with hypercalcemia workup per primary team. AM cortisol normal, less likely to be adrenal insufficiency. Hypercalcemia of malignancy less likely due to good response to chemotherapy based on PET CT scan from last week. Agree with hydration. Can consider zometa 4mg   IV x 1 dose while in house if no etiology can be found. Appreciate internal medicine assistance.    [name], MD Department of Hematology/Oncology [place] [facility] Phone: [phone] Pager: [phone] Email: [email or web address]

## 2020-10-16 NOTE — Progress Notes (Signed)
CRITICAL VALUE ALERT  Critical Value:  Calcium 13.3  Date & Time Notied:  10/16/2020 at 3662  Provider Notified: Lennox Grumbles, NP  Orders Received/Actions taken: Improvement from yesterday (14.8). No orders at this time.

## 2020-10-17 ENCOUNTER — Inpatient Hospital Stay (HOSPITAL_COMMUNITY): Payer: Medicare PPO

## 2020-10-17 DIAGNOSIS — R9431 Abnormal electrocardiogram [ECG] [EKG]: Secondary | ICD-10-CM | POA: Diagnosis not present

## 2020-10-17 LAB — ECHOCARDIOGRAM COMPLETE
Area-P 1/2: 1.87 cm2
Height: 68 in
S' Lateral: 3.8 cm
Weight: 2572.8 oz

## 2020-10-17 LAB — COMPREHENSIVE METABOLIC PANEL
ALT: 17 U/L (ref 0–44)
AST: 27 U/L (ref 15–41)
Albumin: 2.8 g/dL — ABNORMAL LOW (ref 3.5–5.0)
Alkaline Phosphatase: 41 U/L (ref 38–126)
Anion gap: 4 — ABNORMAL LOW (ref 5–15)
BUN: 34 mg/dL — ABNORMAL HIGH (ref 8–23)
CO2: 26 mmol/L (ref 22–32)
Calcium: 12.2 mg/dL — ABNORMAL HIGH (ref 8.9–10.3)
Chloride: 109 mmol/L (ref 98–111)
Creatinine, Ser: 1.94 mg/dL — ABNORMAL HIGH (ref 0.61–1.24)
GFR, Estimated: 36 mL/min — ABNORMAL LOW (ref >60)
Glucose, Bld: 98 mg/dL (ref 70–99)
Potassium: 3.6 mmol/L (ref 3.5–5.1)
Sodium: 139 mmol/L (ref 135–145)
Total Bilirubin: 0.5 mg/dL (ref 0.3–1.2)
Total Protein: 5 g/dL — ABNORMAL LOW (ref 6.5–8.1)

## 2020-10-17 LAB — CALCITRIOL (1,25 DI-OH VIT D): Vit D, 1,25-Dihydroxy: 197 pg/mL — ABNORMAL HIGH (ref 19.9–79.3)

## 2020-10-17 MED ORDER — FUROSEMIDE 10 MG/ML IJ SOLN
40.0000 mg | Freq: Once | INTRAMUSCULAR | Status: AC
  Administered 2020-10-17: 40 mg via INTRAVENOUS
  Filled 2020-10-17: qty 4

## 2020-10-17 MED ORDER — BISACODYL 5 MG PO TBEC
10.0000 mg | DELAYED_RELEASE_TABLET | Freq: Every day | ORAL | Status: DC
  Administered 2020-10-17: 10 mg via ORAL
  Filled 2020-10-17 (×3): qty 2

## 2020-10-17 MED ORDER — ZOLEDRONIC ACID 4 MG IV SOLR
Freq: Once | INTRAVENOUS | Status: AC
  Filled 2020-10-17: qty 1

## 2020-10-17 MED ORDER — BISACODYL 10 MG RE SUPP
10.0000 mg | Freq: Once | RECTAL | Status: AC
  Administered 2020-10-17: 10 mg via RECTAL
  Filled 2020-10-17: qty 1

## 2020-10-17 MED ORDER — ZOLEDRONIC ACID 4 MG/100ML IV SOLN
4.0000 mg | Freq: Once | INTRAVENOUS | Status: DC
  Filled 2020-10-17: qty 100

## 2020-10-17 MED ORDER — POLYETHYLENE GLYCOL 3350 17 G PO PACK
17.0000 g | PACK | Freq: Two times a day (BID) | ORAL | Status: DC
  Administered 2020-10-17 – 2020-10-18 (×3): 17 g via ORAL
  Filled 2020-10-17 (×6): qty 1

## 2020-10-17 NOTE — Progress Notes (Signed)
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  GOAL:   Patient will meet greater than or equal to 90% of their needs  MONITOR:   PO intake,Weight trends,Labs,Supplement acceptance  REASON FOR ASSESSMENT:   Consult Assessment of nutrition requirement/status,Diet education  ASSESSMENT:   39 YOM presented during routine oncology follow-up with poor po, weakness and decreased energy. Pt was admitted for hypercalcemia. PMH of large B cell lymphoma (completeted last cycle Dec. 2021), TIA, Stroke, HTN, HLD, GERD, CKD-3.   Pt indicated that he has had a poor appetite for the past year. He mentioned that he had COVID about a year ago and developed parosmia which distorts smells. This condition ultimately led to a decrease in his appetite. Pt mentioned that since then, he has had poor appetite due to this symptom as well as altered taste changes from chemo. Pt described that these taste changes made foods and beverages taste much sweeter than they were. Intern provided suggestion of altering the taste by using lemon or diluting the beverage. Pt also mentioned that foods that are browned such as browned meat are no longer appetizing and he much prefers meat that is baked or grilled. Pt mentioned that smells can occasionally cause nausea. Chicken was specifically a cause of nausea.  Pt's notes indicate that  Pt consumed 90% of breakfast on 01/27.  Pt mentioned that he tries to eat 3 meals a day and an afternoon snack. However, Pt's wife discussed that due to his decreased appetite  he now typically only eats a few bites of food at a time.   Pt mentioned that milk and dairy products such as cheese have consistently tasted appetizing to him even with the distorted taste and smells he's experienced. Intern discussed with pt his hypercalcemia diagnosis and high sources of calcium. Intern discussed with pt his daily calcium needs of 1200 mg/day for his age and gender. Intern discussed with pt that while he does not need to eliminate dairy and milk products from his diet, he does need to be mindful of his consumption and the amount of calcium he is consuming daily. Intern provided pt and his wife with 2 handouts on calcium consumption.   Pt mentioned that at home he does consume [employer] brand protein shakes. Intern discussed with pt protein's role in the healing process and building LBM.  Pt is currently consuming Ensure Enlive while at the hospital. However due to it's high calcium, intern discussed with pt the rationale behind discontinuing this ONS and rather having ProSource Plus which would still provide adequate protein. Pt was willing to try this ONS.  Pt discussed with intern that his UBW was 185#, but he felt he had lost weight since his cancer diagnosis. He mentioned that his weight fluctuates in between his chemo treatments. Pt's noted weights indicate that his last weight was 10/15/20 where he weight 160#. Pt did experience a significant weight loss of 7% in 1 month between 12/27 when his weight was 171# and 10/15/20.   Pt denied any nausea or vomiting recently. Pt denied any diarrhea nor abdominal pain, but mentioned  he had been suffering from constipation. Pt mentioned he was taking a stool softener at home.  Meds Reviewed: Dulcolax (10 mg, daily), EE (237 mL, BID), Miralax (17 g, BID), Seneko-S (2 tablets, BID)  Labs Reviewed: BUN (34 mg/dL), creatinine (1.94 mg/dL), calcium (12.2 mg/dL), GFR (36 mL/min), glucose (98 mg/dL)  NUTRITION - FOCUSED PHYSICAL EXAM:  Flowsheet  Row Most Recent Value  Orbital Region No depletion  Upper Arm Region Mild depletion  Thoracic and Lumbar Region Mild depletion  Buccal Region Mild depletion  Temple Region Moderate depletion  Clavicle Bone Region Moderate depletion  Clavicle and Acromion Bone Region Moderate depletion  Scapular Bone Region Moderate depletion  Dorsal Hand No depletion  Patellar Region Mild depletion  Anterior Thigh Region Moderate depletion  Posterior Calf Region Moderate depletion  Edema (RD Assessment) None  Hair Reviewed  Eyes Reviewed  Mouth Reviewed  Skin Reviewed  Nails Reviewed       Diet Order:   Diet Order            Diet regular Room service appropriate? Yes; Fluid consistency: Thin  Diet effective now                 EDUCATION NEEDS:   Education needs have been addressed (Education needs were addressed for hypercalcemia.)  Skin:  Skin Assessment: Reviewed RN Assessment  Last BM:  10/14/20  Height:   Ht Readings from Last 1 Encounters:  10/15/20 5\' 8"  (1.727 m)    Weight:   Wt Readings from Last 1 Encounters:  10/15/20 72.9 kg    Ideal Body Weight:  70 kg  BMI:  Body mass index is 24.45 kg/m.  Estimated Nutritional Needs:   Kcal:  2100-2300   Protein:  105-120   Fluid:  >2.1 L    Salvadore Oxford, Dietetic Intern 10/17/2020 2:51 PM

## 2020-10-17 NOTE — Progress Notes (Signed)
*  PRELIMINARY RESULTS* Echocardiogram 2D Echocardiogram has been performed.  Leavy Cella 10/17/2020, 3:41 PM

## 2020-10-17 NOTE — Progress Notes (Signed)
Brief Oncology Note:  Chart and labs have been reviewed.  He continues to have persistent hypercalcemia although it is slightly improved.  He also continues to have AKI.  Calcitriol level was elevated at 197, PTH low at 9 and vitamin D, 25-hydroxy was normal at 42.11.  PTH related peptide is still pending.  Etiology of his hypercalcemia still remains unclear.   Connor Adams 75 y.o. male with medical history significant for Stage I DLBCL who presents for a follow up visit. 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 At this time he has completed all planned chemotherapy treatment (R-EPOCH x 6 cycles).   On exam today Connor Adams his usual self. He has hypercalcemia, elevation in creatinine, and leukocytosis. Fortunately he tested negative for Covid. We are having him admitted for further evaluation of this issue. At this time I do not believe it is related to his cancer as his PET scan from a few days ago shows an excellent response to treatment.  IPI Score: 2 points, good prognosis/ low-intermediate risk group (81% OS, 80% PFS)  #Hypercalcemia #Acute Kidney Injury #Weakness --Labs in our office 10/15/2020 show a calcium 14.8, creatinine of 2.08. Hemoglobin is at 9.2 and white blood cell count is elevated at 11.1. --patient is COVID negative, PET CT scan from last week shows good response to chemotherapy with no strong evidence of active disease.  --Etiology of the findings are concerning for either adrenal insufficiency or renal dysfunction. Patient has been eating and drinking normally but has had a weight loss of 20 pounds recently and his energy levels have been low. --The patient is currently admitted to the hospital for work-up and treatment of his hypercalcemia and AKI --Calcium  level is very slowly trending downward with hydration.  AKI persists. --Additional work-up pending including vitamin D 25-hydroxy normal, cortisol level normal, phosphorus normal, PTH low, Calcitrol elevated, PTH related peptide pending. --Advised patient to decrease calcium intake. --Chart reviewed with on-call physician and given the degree of hypercalcemia, will go ahead and administer Zometa 4 mg x 1 dose. --If hypercalcemia continues to persist, consider calcitonin.  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  --findings consistent with a double hit DLBCL Treatment of choice is R-EPOCH chemotherapy in the inpatient setting. --he is currently s/p Cycle 6 administered on 09/08/2020 --patient has a port in place. TTE was performed and showed strong baseline heart function. --repeat evaluation PET CT scanshows good response to chemotherapy.  --RTCpending disposition from the hospital  #Lung Nodules, resolved -- noted on interim PET CT scan after Cycle 4.  --unclear if these represent areas of infection, metastatic disease, or progression of lymphoma --pulmonology consulted, appreciate their input.  --will monitor respiratory status closely --repeat PET CTinJan 2022 showed resolution of the lesions --?could represent a granulomatous process which may have been responsive to steroids which he was receiving as part of his chemotherapy regimen. --Further work-up per hospitalist. --Could consider a trial of steroids.

## 2020-10-17 NOTE — Progress Notes (Signed)
PROGRESS NOTE    Connor Adams  JJK:[id number] DOB: 1945-12-13 DOA: 10/15/2020 PCP: Shirline Frees, MD    Brief Narrative: 75 year old male with history of diffuse large B-cell lymphoma followed by Dr. Lorenso Courier on chemotherapy.  He finished his last chemo in December 2021 and follow-up PET scan showed remission. He was admitted with generalized weakness poor appetite poor p.o. intake constipation dyspnea on exertion.  He was found to have hypercalcemia and was admitted for further management.  Assessment & Plan:   Active Problems:   Hypercalcemia  #1 hypercalcemia-? Etiology.  Upon admission his corrected calcium was over 15.  He has received IV fluids since admission.  Work-up shows very elevated 125 dihydroxy vitamin D.  PTH is low.  Phosphorus is normal.  Alkaline phosphatase is normal. Patient and his wife tells me that he does not take any supplements at home other than a regular multivitamin. He has ongoing constipation, complaining of shortness of breath, hypoxic when off of oxygen.  I will order chest x-ray and echocardiogram to rule out any infiltrative granulomatous diseases. I will hold off on CT his chest abdomen and pelvis with contrast due to elevated creatinine. I will continue IV fluids.  His creatinine has slightly bumped since yesterday in spite of being on IV fluids.  Cortisol level is normal.  #2 AKI due to decreased p.o. intake dehydration improving creatinine on IV fluids.  Renal ultrasound done 10/17/2020 with no acute findings other than 2 cysts.  #3 hypertension continue current meds  #4 history of TIA continue home meds  #5 constipation we will start MiraLAX.  #6 elevated vitamin D 125 dihydroxy vitamin D at 197.  Reference ranges between 19 and 79.  Patient denies taking any supplements at home other than a regular multivitamin. Will check chest xray echo ekg  Need to rule out granulomatous disease lymphoma etc.  #7 hypoxia patient positive over 4 L on IV  fluids will give 1 dose of Lasix.  Obtain chest x-ray.  Estimated body mass index is 24.45 kg/m as calculated from the following:   Height as of this encounter: 5\' 8"  (1.727 m).   Weight as of this encounter: 72.9 kg.  DVT prophylaxis: Lovenox Code Status: Full code Family Communication: Discussed with patient's wife  disposition Plan:  Status is: Inpatient  Dispo: The patient is from: Home              Anticipated d/c is to: Home              Anticipated d/c date is: 1 day              Patient currently is not medically stable to d/c.   Difficult to place patient na  Consultants:   onc  Procedures:none Antimicrobials:none  Subjective: When I saw him earlier he was still complaining of constipation on stool softeners gave him a Dulcolax suppository with good results. Objective: Vitals:   10/16/20 1326 10/16/20 1356 10/16/20 2025 10/17/20 0626  BP: 124/67  (!) 131/99 (!) 149/68  Pulse: (!) 57  66 61  Resp: 18  16 14   Temp: 98.5 F (36.9 C)  98.1 F (36.7 C) 98.5 F (36.9 C)  TempSrc: Oral  Oral Oral  SpO2: 92% 92% 90% 92%  Weight:      Height:        Intake/Output Summary (Last 24 hours) at 10/17/2020 1349 Last data filed at 10/17/2020 0625 Gross per 24 hour  Intake 3665.64 ml  Output 350  ml  Net 3315.64 ml   Filed Weights   10/15/20 1527  Weight: 72.9 kg    Examination:  General exam: Appears calm and comfortable  Respiratory system: Diminished breath sounds at the bases to auscultation. Respiratory effort normal. Cardiovascular system: S1 & S2 heard, RRR. No JVD, murmurs, rubs, gallops or clicks. No pedal edema. Gastrointestinal system: Abdomen is nondistended, soft and nontender. No organomegaly or masses felt. Normal bowel sounds heard. Central nervous system: Alert and oriented. No focal neurological deficits. Extremities: Symmetric 5 x 5 power. Skin: No rashes, lesions or ulcers Psychiatry: Judgement and insight appear normal. Mood & affect  appropriate.     Data Reviewed: I have personally reviewed following labs and imaging studies  CBC: Recent Labs  Lab 10/15/20 0820  WBC 11.7*  NEUTROABS 8.0*  HGB 9.2*  HCT 30.2*  MCV 90.7  PLT 99991111   Basic Metabolic Panel: Recent Labs  Lab 10/15/20 0820 10/15/20 1650 10/16/20 0519 10/17/20 0558  NA 139  --  140 139  K 4.1  --  4.0 3.6  CL 104  --  107 109  CO2 28  --  27 26  GLUCOSE 107*  --  105* 98  BUN 45*  --  42* 34*  CREATININE 2.08*  --  1.84* 1.94*  CALCIUM 14.8* 12.9* 13.3* 12.2*  PHOS  --  4.3  --   --    GFR: Estimated Creatinine Clearance: 32.3 mL/min (A) (by C-G formula based on SCr of 1.94 mg/dL (H)). Liver Function Tests: Recent Labs  Lab 10/15/20 0820 10/16/20 0519 10/17/20 0558  AST 39 26 27  ALT 16 15 17   ALKPHOS 53 42 41  BILITOT 0.4 0.5 0.5  PROT 5.7* 5.1* 5.0*  ALBUMIN 3.3* 2.9* 2.8*   No results for input(s): LIPASE, AMYLASE in the last 168 hours. No results for input(s): AMMONIA in the last 168 hours. Coagulation Profile: No results for input(s): INR, PROTIME in the last 168 hours. Cardiac Enzymes: No results for input(s): CKTOTAL, CKMB, CKMBINDEX, TROPONINI in the last 168 hours. BNP (last 3 results) No results for input(s): PROBNP in the last 8760 hours. HbA1C: No results for input(s): HGBA1C in the last 72 hours. CBG: No results for input(s): GLUCAP in the last 168 hours. Lipid Profile: No results for input(s): CHOL, HDL, LDLCALC, TRIG, CHOLHDL, LDLDIRECT in the last 72 hours. Thyroid Function Tests: No results for input(s): TSH, T4TOTAL, FREET4, T3FREE, THYROIDAB in the last 72 hours. Anemia Panel: No results for input(s): VITAMINB12, FOLATE, FERRITIN, TIBC, IRON, RETICCTPCT in the last 72 hours. Sepsis Labs: No results for input(s): PROCALCITON, LATICACIDVEN in the last 168 hours.  Recent Results (from the past 240 hour(s))  SARS Coronavirus 2 (TAT 6-24 hrs)     Status: None   Collection Time: 10/15/20  9:40 AM    Specimen: Nasopharyngeal Swab  Result Value Ref Range Status   SARS Coronavirus 2 NEGATIVE NEGATIVE Final    Comment: (NOTE) SARS-CoV-2 target nucleic acids are NOT DETECTED.  The SARS-CoV-2 RNA is generally detectable in upper and lower respiratory specimens during the acute phase of infection. Negative results do not preclude SARS-CoV-2 infection, do not rule out co-infections with other pathogens, and should not be used as the sole basis for treatment or other patient management decisions. Negative results must be combined with clinical observations, patient history, and epidemiological information. The expected result is Negative.  Fact Sheet for Patients: SugarRoll.be  Fact Sheet for Healthcare Providers: https://www.[employer] [employer].com/  This test is  not yet approved or cleared by the [place] and  has been authorized for detection and/or diagnosis of SARS-CoV-2 by FDA under an Emergency Use Authorization (EUA). This EUA will remain  in effect (meaning this test can be used) for the duration of the COVID-19 declaration under Se ction 564(b)(1) of the Act, 21 U.S.C. section 360bbb-3(b)(1), unless the authorization is terminated or revoked sooner.  Performed at [facility], [place] [address], [place], [place] [address]          Radiology Studies: US RENAL  Result Date: 10/17/2020 CLINICAL DATA:  Elevated creatinine EXAM: RENAL / URINARY TRACT ULTRASOUND COMPLETE COMPARISON:  CT abdomen and pelvis April 17, 2020. FINDINGS: Right Kidney: Renal measurements: 12.8 x 7.0 x 4.5 cm = volume: 213 mL. Echogenicity within normal limits. 1 cm interpolar renal cyst. No solid mass or hydronephrosis visualized. Left Kidney: Renal measurements: 12.7 x 6.5 x 6.4 cm = volume: 279 mL. Echogenicity within normal limits. 1.5 cm lower pole renal cyst. No solid mass or hydronephrosis visualized. Bladder: Appears normal for degree of bladder  distention. Bilateral ureteral jets visualized. Other: None. IMPRESSION: 1. No hydronephrosis. 2. Small bilateral renal cysts. Electronically Signed   By: Dahlia Bailiff MD   On: 10/17/2020 08:21        Scheduled Meds: . bisacodyl  10 mg Oral Daily  . Chlorhexidine Gluconate Cloth  6 each Topical Daily  . enoxaparin (LOVENOX) injection  40 mg Subcutaneous Q24H  . feeding supplement  237 mL Oral BID BM  . polyethylene glycol  17 g Oral BID  . senna-docusate  2 tablet Oral BID   Continuous Infusions: . sodium chloride 100 mL/hr at 10/17/20 4259     LOS: 2 days   Georgette Shell, MD  10/17/2020, 1:49 PM

## 2020-10-17 NOTE — Care Management Important Message (Signed)
Important Message  Patient Details IM Letter given to the Patient. Name: Connor Adams MRN: [id number] Date of Birth: 04-Mar-1946   Medicare Important Message Given:  Yes     [name] 10/17/2020, 12:33 PM

## 2020-10-18 LAB — C-REACTIVE PROTEIN: CRP: 2.4 mg/dL — ABNORMAL HIGH (ref <1.0)

## 2020-10-18 LAB — COMPREHENSIVE METABOLIC PANEL
ALT: 19 U/L (ref 0–44)
AST: 29 U/L (ref 15–41)
Albumin: 3.3 g/dL — ABNORMAL LOW (ref 3.5–5.0)
Alkaline Phosphatase: 46 U/L (ref 38–126)
Anion gap: 7 (ref 5–15)
BUN: 38 mg/dL — ABNORMAL HIGH (ref 8–23)
CO2: 26 mmol/L (ref 22–32)
Calcium: 12.2 mg/dL — ABNORMAL HIGH (ref 8.9–10.3)
Chloride: 110 mmol/L (ref 98–111)
Creatinine, Ser: 1.76 mg/dL — ABNORMAL HIGH (ref 0.61–1.24)
GFR, Estimated: 40 mL/min — ABNORMAL LOW (ref >60)
Glucose, Bld: 101 mg/dL — ABNORMAL HIGH (ref 70–99)
Potassium: 3.5 mmol/L (ref 3.5–5.1)
Sodium: 143 mmol/L (ref 135–145)
Total Bilirubin: 0.4 mg/dL (ref 0.3–1.2)
Total Protein: 5.4 g/dL — ABNORMAL LOW (ref 6.5–8.1)

## 2020-10-18 LAB — SEDIMENTATION RATE: Sed Rate: 28 mm/hr — ABNORMAL HIGH (ref 0–16)

## 2020-10-18 MED ORDER — FUROSEMIDE 10 MG/ML IJ SOLN
40.0000 mg | Freq: Once | INTRAMUSCULAR | Status: AC
  Administered 2020-10-18: 40 mg via INTRAVENOUS
  Filled 2020-10-18: qty 4

## 2020-10-18 NOTE — Progress Notes (Signed)
PROGRESS NOTE    Connor Adams  [id number] DOB: [birth year] DOA: 10/15/2020 PCP: Shirline Frees, MD    Brief Narrative: 75 year old male with history of diffuse large B-cell lymphoma followed by Dr. Lorenso Courier on chemotherapy.  He finished his last chemo in December 2021 and follow-up PET scan showed remission. He was admitted with generalized weakness poor appetite poor p.o. intake constipation dyspnea on exertion.  He was found to have hypercalcemia and was admitted for further management.  Assessment & Plan:   Active Problems:   Hypercalcemia  #1 hypercalcemia-? Etiology.  Upon admission his corrected calcium was over 15.  He has received IV fluids since admission.  Work-up shows very elevated 125 dihydroxy vitamin D is 197.0.  PTH is low.  Phosphorus is normal.  Alkaline phosphatase is normal. Patient and his wife tells me that he does not take any supplements at home other than a regular multivitamin. He has ongoing constipation, which has been resolved since he has been on multiple stool softeners and laxatives.  Chest x-ray shows no acute findings.  Echo with normal ejection fraction.  He was given 1 dose of Lasix yesterday 40 mg with Zometa.  He is now on room air however he still remains hypercalcemic.  I will hold off on CT his chest abdomen and pelvis with contrast due to elevated creatinine. I will continue IV fluids.  His creatinine has improved to 1.76 from 1.94 on IV fluids and Lasix.  Normal cortisol levels.   Will give lasix 40 mg today  #2 AKI due to decreased p.o. intake dehydration improving creatinine on IV fluids.  Renal ultrasound done 10/17/2020 with no acute findings other than 2 cysts.  #3 hypertension continue current meds  #4 history of TIA continue home meds  #5 constipation resolved continue miralax senna dulcolax  #6 elevated vitamin D 125 dihydroxy vitamin D at 197.  Reference ranges between 19 and 79.  Patient denies taking any supplements at home  other than a regular multivitamin. Need to rule out granulomatous disease lymphoma etc.  #7 hypoxia patient positive over 4 L on IV fluids will give 1 dose of Lasix.  Obtain chest x-ray.  Estimated body mass index is 24.45 kg/m as calculated from the following:   Height as of this encounter: 5\' 8"  (1.727 m).   Weight as of this encounter: 72.9 kg.  DVT prophylaxis: Lovenox Code Status: Full code Family Communication: Discussed with patient's wife  disposition Plan:  Status is: Inpatient  Dispo: The patient is from: Home              Anticipated d/c is to: Home              Anticipated d/c date is: 1 day              Patient currently is not medically stable to d/c.   Difficult to place patient na  Consultants:   onc  Procedures:none Antimicrobials:none  Subjective: When I saw him earlier he was still complaining of constipation on stool softeners gave him a Dulcolax suppository with good results. Objective: Vitals:   10/16/20 2025 10/17/20 0626 10/17/20 1400 10/18/20 0456  BP: (!) 131/99 (!) 149/68 123/63 137/73  Pulse: 66 61 79 73  Resp: 16 14 16 14   Temp: 98.1 F (36.7 C) 98.5 F (36.9 C) 98 F (36.7 C) 99.3 F (37.4 C)  TempSrc: Oral Oral Oral Oral  SpO2: 90% 92% 93% 90%  Weight:      Height:  Intake/Output Summary (Last 24 hours) at 10/18/2020 1252 Last data filed at 10/18/2020 0501 Gross per 24 hour  Intake 2162.74 ml  Output --  Net 2162.74 ml   Filed Weights   10/15/20 1527  Weight: 72.9 kg    Examination:  General exam: Appears calm and comfortable  Respiratory system: Diminished breath sounds at the bases to auscultation. Respiratory effort normal. Cardiovascular system: S1 & S2 heard, RRR. No JVD, murmurs, rubs, gallops or clicks. No pedal edema. Gastrointestinal system: Abdomen is nondistended, soft and nontender. No organomegaly or masses felt. Normal bowel sounds heard. Central nervous system: Alert and oriented. No focal neurological  deficits. Extremities: Symmetric 5 x 5 power. Skin: No rashes, lesions or ulcers Psychiatry: Judgement and insight appear normal. Mood & affect appropriate.     Data Reviewed: I have personally reviewed following labs and imaging studies  CBC: Recent Labs  Lab 10/15/20 0820  WBC 11.7*  NEUTROABS 8.0*  HGB 9.2*  HCT 30.2*  MCV 90.7  PLT 025   Basic Metabolic Panel: Recent Labs  Lab 10/15/20 0820 10/15/20 1650 10/16/20 0519 10/17/20 0558 10/18/20 0744  NA 139  --  140 139 143  K 4.1  --  4.0 3.6 3.5  CL 104  --  107 109 110  CO2 28  --  27 26 26   GLUCOSE 107*  --  105* 98 101*  BUN 45*  --  42* 34* 38*  CREATININE 2.08*  --  1.84* 1.94* 1.76*  CALCIUM 14.8* 12.9* 13.3* 12.2* 12.2*  PHOS  --  4.3  --   --   --    GFR: Estimated Creatinine Clearance: 35.6 mL/min (A) (by C-G formula based on SCr of 1.76 mg/dL (H)). Liver Function Tests: Recent Labs  Lab 10/15/20 0820 10/16/20 0519 10/17/20 0558 10/18/20 0744  AST 39 26 27 29   ALT 16 15 17 19   ALKPHOS 53 42 41 46  BILITOT 0.4 0.5 0.5 0.4  PROT 5.7* 5.1* 5.0* 5.4*  ALBUMIN 3.3* 2.9* 2.8* 3.3*   No results for input(s): LIPASE, AMYLASE in the last 168 hours. No results for input(s): AMMONIA in the last 168 hours. Coagulation Profile: No results for input(s): INR, PROTIME in the last 168 hours. Cardiac Enzymes: No results for input(s): CKTOTAL, CKMB, CKMBINDEX, TROPONINI in the last 168 hours. BNP (last 3 results) No results for input(s): PROBNP in the last 8760 hours. HbA1C: No results for input(s): HGBA1C in the last 72 hours. CBG: No results for input(s): GLUCAP in the last 168 hours. Lipid Profile: No results for input(s): CHOL, HDL, LDLCALC, TRIG, CHOLHDL, LDLDIRECT in the last 72 hours. Thyroid Function Tests: No results for input(s): TSH, T4TOTAL, FREET4, T3FREE, THYROIDAB in the last 72 hours. Anemia Panel: No results for input(s): VITAMINB12, FOLATE, FERRITIN, TIBC, IRON, RETICCTPCT in the last  72 hours. Sepsis Labs: No results for input(s): PROCALCITON, LATICACIDVEN in the last 168 hours.  Recent Results (from the past 240 hour(s))  SARS Coronavirus 2 (TAT 6-24 hrs)     Status: None   Collection Time: 10/15/20  9:40 AM   Specimen: Nasopharyngeal Swab  Result Value Ref Range Status   SARS Coronavirus 2 NEGATIVE NEGATIVE Final    Comment: (NOTE) SARS-CoV-2 target nucleic acids are NOT DETECTED.  The SARS-CoV-2 RNA is generally detectable in upper and lower respiratory specimens during the acute phase of infection. Negative results do not preclude SARS-CoV-2 infection, do not rule out co-infections with other pathogens, and should not be used as  the sole basis for treatment or other patient management decisions. Negative results must be combined with clinical observations, patient history, and epidemiological information. The expected result is Negative.  Fact Sheet for Patients: SugarRoll.be  Fact Sheet for Healthcare Providers: https://www.[employer].com/  This test is not yet approved or cleared by the [place] FDA and  has been authorized for detection and/or diagnosis of SARS-CoV-2 by FDA under an Emergency Use Authorization (EUA). This EUA will remain  in effect (meaning this test can be used) for the duration of the COVID-19 declaration under Se ction 564(b)(1) of the Act, 21 U.S.C. section 360bbb-3(b)(1), unless the authorization is terminated or revoked sooner.  Performed at [facility], [place] [address], [place], [place] [address]          Radiology Studies: DG Chest 1 View  Result Date: 10/17/2020 CLINICAL DATA:  B-cell lymphoma.  Shortness of breath EXAM: CHEST  1 VIEW COMPARISON:  01/05/2014 FINDINGS: Right Port-A-Cath in place with the tip at the cavoatrial junction. Heart and mediastinal contours are within normal limits. No focal opacities or effusions. No acute bony abnormality. Old healed  left rib fractures. IMPRESSION: No active cardiopulmonary disease. Electronically Signed   By: Rolm Baptise M.D.   On: 10/17/2020 15:08   US RENAL  Result Date: 10/17/2020 CLINICAL DATA:  Elevated creatinine EXAM: RENAL / URINARY TRACT ULTRASOUND COMPLETE COMPARISON:  CT abdomen and pelvis April 17, 2020. FINDINGS: Right Kidney: Renal measurements: 12.8 x 7.0 x 4.5 cm = volume: 213 mL. Echogenicity within normal limits. 1 cm interpolar renal cyst. No solid mass or hydronephrosis visualized. Left Kidney: Renal measurements: 12.7 x 6.5 x 6.4 cm = volume: 279 mL. Echogenicity within normal limits. 1.5 cm lower pole renal cyst. No solid mass or hydronephrosis visualized. Bladder: Appears normal for degree of bladder distention. Bilateral ureteral jets visualized. Other: None. IMPRESSION: 1. No hydronephrosis. 2. Small bilateral renal cysts. Electronically Signed   By: Dahlia Bailiff MD   On: 10/17/2020 08:21   ECHOCARDIOGRAM COMPLETE  Result Date: 10/17/2020    ECHOCARDIOGRAM REPORT   Patient Name:   DHEERAJ HAIL [facility] Date of Exam: 10/17/2020 Medical Rec #:  [id number]        Height:       68.0 in Accession #:    [id number]       Weight:       160.8 lb Date of Birth:  September 06, 1946        BSA:          1.863 m Patient Age:    61 years         BP:           149/68 mmHg Patient Gender: M                HR:           61 bpm. Exam Location:  Inpatient Procedure: 2D Echo Indications:    Abnormal ECG R94.31  History:        Patient has prior history of Echocardiogram examinations, most                 recent 09/06/2020. Risk Factors:Hypertension, Dyslipidemia and                 Non-Smoker.  Sonographer:    Leavy Cella Referring Phys: [id number] [place]  1. Left ventricular ejection fraction, by estimation, is 55 to 60%. The left ventricle has normal function. The left ventricle has  no regional wall motion abnormalities. Left ventricular diastolic parameters were normal.  2. Right ventricular  systolic function is normal. The right ventricular size is normal.  3. Left atrial size was mildly dilated.  4. The mitral valve is grossly normal. Trivial mitral valve regurgitation.  5. The aortic valve is tricuspid. Aortic valve regurgitation is not visualized.  6. The inferior vena cava is normal in size with greater than 50% respiratory variability, suggesting right atrial pressure of 3 mmHg. Comparison(s): Changes from prior study are noted. 09/06/2020: LVEF 60-65%, grade 1 DD. FINDINGS  Left Ventricle: Left ventricular ejection fraction, by estimation, is 55 to 60%. The left ventricle has normal function. The left ventricle has no regional wall motion abnormalities. The left ventricular internal cavity size was normal in size. There is  no left ventricular hypertrophy. Left ventricular diastolic parameters were normal. Right Ventricle: The right ventricular size is normal. No increase in right ventricular wall thickness. Right ventricular systolic function is normal. Left Atrium: Left atrial size was mildly dilated. Right Atrium: Right atrial size was normal in size. Pericardium: There is no evidence of pericardial effusion. Mitral Valve: The mitral valve is grossly normal. Trivial mitral valve regurgitation. Tricuspid Valve: The tricuspid valve is grossly normal. Tricuspid valve regurgitation is trivial. Aortic Valve: The aortic valve is tricuspid. Aortic valve regurgitation is not visualized. Pulmonic Valve: The pulmonic valve was normal in structure. Pulmonic valve regurgitation is not visualized. Aorta: The aortic root and ascending aorta are structurally normal, with no evidence of dilitation. Venous: The inferior vena cava is normal in size with greater than 50% respiratory variability, suggesting right atrial pressure of 3 mmHg. IAS/Shunts: No atrial level shunt detected by color flow Doppler.  LEFT VENTRICLE PLAX 2D LVIDd:         5.40 cm  Diastology LVIDs:         3.80 cm  LV e' medial:    9.57 cm/s  LV PW:         1.20 cm  LV E/e' medial:  6.9 LV IVS:        0.90 cm  LV e' lateral:   13.30 cm/s LVOT diam:     2.00 cm  LV E/e' lateral: 5.0 LVOT Area:     3.14 cm  RIGHT VENTRICLE RV S prime:     10.80 cm/s TAPSE (M-mode): 2.2 cm LEFT ATRIUM             Index       RIGHT ATRIUM           Index LA diam:        3.80 cm 2.04 cm/m  RA Area:     13.10 cm LA Vol (A2C):   77.9 ml 41.82 ml/m RA Volume:   29.60 ml  15.89 ml/m LA Vol (A4C):   60.0 ml 32.21 ml/m LA Biplane Vol: 69.8 ml 37.47 ml/m   AORTA Ao Root diam: 2.70 cm MITRAL VALVE MV Area (PHT): 1.87 cm    SHUNTS MV Decel Time: 405 msec    Systemic Diam: 2.00 cm MV E velocity: 66.00 cm/s MV A velocity: 54.80 cm/s MV E/A ratio:  1.20 Lyman Bishop MD Electronically signed by Lyman Bishop MD Signature Date/Time: 10/17/2020/4:19:47 PM    Final         Scheduled Meds: . bisacodyl  10 mg Oral Daily  . Chlorhexidine Gluconate Cloth  6 each Topical Daily  . enoxaparin (LOVENOX) injection  40 mg Subcutaneous Q24H  . feeding supplement  237 mL Oral BID BM  . polyethylene glycol  17 g Oral BID  . senna-docusate  2 tablet Oral BID   Continuous Infusions: . sodium chloride 100 mL/hr at 10/18/20 0457     LOS: 3 days   Georgette Shell, MD  10/18/2020, 12:52 PM

## 2020-10-19 DIAGNOSIS — R7989 Other specified abnormal findings of blood chemistry: Secondary | ICD-10-CM | POA: Diagnosis not present

## 2020-10-19 LAB — COMPREHENSIVE METABOLIC PANEL
ALT: 20 U/L (ref 0–44)
AST: 27 U/L (ref 15–41)
Albumin: 3.1 g/dL — ABNORMAL LOW (ref 3.5–5.0)
Alkaline Phosphatase: 41 U/L (ref 38–126)
Anion gap: 8 (ref 5–15)
BUN: 33 mg/dL — ABNORMAL HIGH (ref 8–23)
CO2: 26 mmol/L (ref 22–32)
Calcium: 11.8 mg/dL — ABNORMAL HIGH (ref 8.9–10.3)
Chloride: 106 mmol/L (ref 98–111)
Creatinine, Ser: 1.66 mg/dL — ABNORMAL HIGH (ref 0.61–1.24)
GFR, Estimated: 43 mL/min — ABNORMAL LOW (ref >60)
Glucose, Bld: 98 mg/dL (ref 70–99)
Potassium: 3.2 mmol/L — ABNORMAL LOW (ref 3.5–5.1)
Sodium: 140 mmol/L (ref 135–145)
Total Bilirubin: 0.2 mg/dL — ABNORMAL LOW (ref 0.3–1.2)
Total Protein: 5.2 g/dL — ABNORMAL LOW (ref 6.5–8.1)

## 2020-10-19 LAB — PTH-RELATED PEPTIDE: PTH-related peptide: <2 pmol/L

## 2020-10-19 MED ORDER — POTASSIUM CHLORIDE CRYS ER 20 MEQ PO TBCR
40.0000 meq | EXTENDED_RELEASE_TABLET | Freq: Once | ORAL | Status: AC
  Administered 2020-10-19: 40 meq via ORAL
  Filled 2020-10-19: qty 2

## 2020-10-19 MED ORDER — FUROSEMIDE 10 MG/ML IJ SOLN
60.0000 mg | Freq: Once | INTRAMUSCULAR | Status: AC
  Administered 2020-10-19: 60 mg via INTRAVENOUS
  Filled 2020-10-19 (×2): qty 6

## 2020-10-19 NOTE — Progress Notes (Signed)
PROGRESS NOTE    Romero LinerJerry W Mcquaig  ZOX:[id number] DOB: 25-Nov-1945 DOA: 10/15/2020 PCP: Johny BlamerHarris, William, MD    Brief Narrative: 75 year old male with history of diffuse large B-cell lymphoma followed by Dr. Leonides Schanzorsey on chemotherapy.  He finished his last chemo in December 2021 and follow-up PET scan showed remission. He was admitted with generalized weakness poor appetite poor p.o. intake constipation dyspnea on exertion.  He was found to have hypercalcemia and was admitted for further management.  Assessment & Plan:   Active Problems:   Hypercalcemia  #1 hypercalcemia-? Etiology.  Upon admission his corrected calcium was over 15.  He has received IV fluids since admission.  Work-up shows very elevated 125 dihydroxy vitamin D is 197.0.  PTH is low.  Phosphorus is normal.  Alkaline phosphatase is normal. Patient and his wife tells me that he does not take any supplements at home other than a regular multivitamin. He has ongoing constipation, which has been resolved since he has been on multiple stool softeners and laxatives.  Chest x-ray shows no acute findings.  Echo with normal ejection fraction.  He was given 1 dose of Lasix yesterday 40 mg with Zometa.  He is now on room air however he still remains hypercalcemic.  I will hold off on CT his chest abdomen and pelvis with contrast due to elevated creatinine. I will continue IV fluids.  His creatinine has improved to 1.66 from 1.94 on IV fluids and Lasix.  Normal cortisol levels.   Will give lasix 60 mg iv  #2 AKI due to decreased p.o. intake dehydration improving creatinine on IV fluids.  Renal ultrasound done 10/17/2020 with no acute findings other than 2 cysts.  #3 hypertension continue current meds  #4 history of TIA continue home meds  #5 constipation resolved continue miralax senna dulcolax  #6 elevated vitamin D 125 dihydroxy vitamin D at 197.  Reference ranges between 19 and 79.  Patient denies taking any supplements at home  other than a regular multivitamin. Need to rule out granulomatous disease lymphoma etc.  #7 hypoxia patient positive over 8 L on IV fluids will give lasix 60 mg.chest xray clear.   Estimated body mass index is 24.45 kg/m as calculated from the following:   Height as of this encounter: 5\' 8"  (1.727 m).   Weight as of this encounter: 72.9 kg.  DVT prophylaxis: Lovenox Code Status: Full code Family Communication: Discussed with patient's wife  disposition Plan:  Status is: Inpatient  Dispo: The patient is from: Home              Anticipated d/c is to: Home              Anticipated d/c date is: 1 day              Patient currently is not medically stable to d/c.   Difficult to place patient na  Consultants:   onc  Procedures:none Antimicrobials:none  Subjective: Ambulating in hall way  No new c/o Objective: Vitals:   10/17/20 1400 10/18/20 0456 10/18/20 2052 10/19/20 0520  BP: 123/63 137/73 118/68 139/67  Pulse: 79 73 (!) 57 68  Resp: 16 14 14 14   Temp: 98 F (36.7 C) 99.3 F (37.4 C) (!) 97.4 F (36.3 C) 98.9 F (37.2 C)  TempSrc: Oral Oral Oral Oral  SpO2: 93% 90% 98% 97%  Weight:      Height:        Intake/Output Summary (Last 24 hours) at 10/19/2020 1234 Last  data filed at 10/19/2020 0540 Gross per 24 hour  Intake 2442.03 ml  Output 750 ml  Net 1692.03 ml   Filed Weights   10/15/20 1527  Weight: 72.9 kg    Examination:  General exam: Appears calm and comfortable  Respiratory system: Diminished breath sounds at the bases to auscultation. Respiratory effort normal. Cardiovascular system: S1 & S2 heard, RRR. No JVD, murmurs, rubs, gallops or clicks. No pedal edema. Gastrointestinal system: Abdomen is nondistended, soft and nontender. No organomegaly or masses felt. Normal bowel sounds heard. Central nervous system: Alert and oriented. No focal neurological deficits. Extremities: Symmetric 5 x 5 power. Skin: No rashes, lesions or ulcers Psychiatry:  Judgement and insight appear normal. Mood & affect appropriate.     Data Reviewed: I have personally reviewed following labs and imaging studies  CBC: Recent Labs  Lab 10/15/20 0820  WBC 11.7*  NEUTROABS 8.0*  HGB 9.2*  HCT 30.2*  MCV 90.7  PLT [id number]   Basic Metabolic Panel: Recent Labs  Lab 10/15/20 0820 10/15/20 1650 10/16/20 0519 10/17/20 0558 10/18/20 0744 10/19/20 0539  NA 139  --  140 139 143 140  K 4.1  --  4.0 3.6 3.5 3.2*  CL 104  --  107 109 110 106  CO2 28  --  27 26 26 26   GLUCOSE 107*  --  105* 98 101* 98  BUN 45*  --  42* 34* 38* 33*  CREATININE 2.08*  --  1.84* 1.94* 1.76* 1.66*  CALCIUM 14.8* 12.9* 13.3* 12.2* 12.2* 11.8*  PHOS  --  4.3  --   --   --   --    GFR: Estimated Creatinine Clearance: 37.8 mL/min (A) (by C-G formula based on SCr of 1.66 mg/dL (H)). Liver Function Tests: Recent Labs  Lab 10/15/20 0820 10/16/20 0519 10/17/20 0558 10/18/20 0744 10/19/20 0539  AST 39 26 27 29 27   ALT 16 15 17 19 20   ALKPHOS 53 42 41 46 41  BILITOT 0.4 0.5 0.5 0.4 0.2*  PROT 5.7* 5.1* 5.0* 5.4* 5.2*  ALBUMIN 3.3* 2.9* 2.8* 3.3* 3.1*   No results for input(s): LIPASE, AMYLASE in the last 168 hours. No results for input(s): AMMONIA in the last 168 hours. Coagulation Profile: No results for input(s): INR, PROTIME in the last 168 hours. Cardiac Enzymes: No results for input(s): CKTOTAL, CKMB, CKMBINDEX, TROPONINI in the last 168 hours. BNP (last 3 results) No results for input(s): PROBNP in the last 8760 hours. HbA1C: No results for input(s): HGBA1C in the last 72 hours. CBG: No results for input(s): GLUCAP in the last 168 hours. Lipid Profile: No results for input(s): CHOL, HDL, LDLCALC, TRIG, CHOLHDL, LDLDIRECT in the last 72 hours. Thyroid Function Tests: No results for input(s): TSH, T4TOTAL, FREET4, T3FREE, THYROIDAB in the last 72 hours. Anemia Panel: No results for input(s): VITAMINB12, FOLATE, FERRITIN, TIBC, IRON, RETICCTPCT in the last 72  hours. Sepsis Labs: No results for input(s): PROCALCITON, LATICACIDVEN in the last 168 hours.  Recent Results (from the past 240 hour(s))  SARS Coronavirus 2 (TAT 6-24 hrs)     Status: None   Collection Time: 10/15/20  9:40 AM   Specimen: Nasopharyngeal Swab  Result Value Ref Range Status   SARS Coronavirus 2 NEGATIVE NEGATIVE Final    Comment: (NOTE) SARS-CoV-2 target nucleic acids are NOT DETECTED.  The SARS-CoV-2 RNA is generally detectable in upper and lower respiratory specimens during the acute phase of infection. Negative results do not preclude SARS-CoV-2 infection, do  not rule out co-infections with other pathogens, and should not be used as the sole basis for treatment or other patient management decisions. Negative results must be combined with clinical observations, patient history, and epidemiological information. The expected result is Negative.  Fact Sheet for Patients: SugarRoll.be  Fact Sheet for Healthcare Providers: https://www.woods-mathews.com/  This test is not yet approved or cleared by the [place] FDA and  has been authorized for detection and/or diagnosis of SARS-CoV-2 by FDA under an Emergency Use Authorization (EUA). This EUA will remain  in effect (meaning this test can be used) for the duration of the COVID-19 declaration under Se ction 564(b)(1) of the Act, 21 U.S.C. section 360bbb-3(b)(1), unless the authorization is terminated or revoked sooner.  Performed at [facility], [place] [address], [place], [place] [address]          Radiology Studies: DG Chest 1 View  Result Date: 10/17/2020 CLINICAL DATA:  B-cell lymphoma.  Shortness of breath EXAM: CHEST  1 VIEW COMPARISON:  01/05/2014 FINDINGS: Right Port-A-Cath in place with the tip at the cavoatrial junction. Heart and mediastinal contours are within normal limits. No focal opacities or effusions. No acute bony abnormality. Old healed left  rib fractures. IMPRESSION: No active cardiopulmonary disease. Electronically Signed   By: Rolm Baptise M.D.   On: 10/17/2020 15:08   ECHOCARDIOGRAM COMPLETE  Result Date: 10/17/2020    ECHOCARDIOGRAM REPORT   Patient Name:   DREVIN ORTNER [facility] Date of Exam: 10/17/2020 Medical Rec #:  [id number]        Height:       68.0 in Accession #:    [id number]       Weight:       160.8 lb Date of Birth:  08-31-1946        BSA:          1.863 m Patient Age:    21 years         BP:           149/68 mmHg Patient Gender: M                HR:           61 bpm. Exam Location:  Inpatient Procedure: 2D Echo Indications:    Abnormal ECG R94.31  History:        Patient has prior history of Echocardiogram examinations, most                 recent 09/06/2020. Risk Factors:Hypertension, Dyslipidemia and                 Non-Smoker.  Sonographer:    Leavy Cella Referring Phys: [id number] [place]  1. Left ventricular ejection fraction, by estimation, is 55 to 60%. The left ventricle has normal function. The left ventricle has no regional wall motion abnormalities. Left ventricular diastolic parameters were normal.  2. Right ventricular systolic function is normal. The right ventricular size is normal.  3. Left atrial size was mildly dilated.  4. The mitral valve is grossly normal. Trivial mitral valve regurgitation.  5. The aortic valve is tricuspid. Aortic valve regurgitation is not visualized.  6. The inferior vena cava is normal in size with greater than 50% respiratory variability, suggesting right atrial pressure of 3 mmHg. Comparison(s): Changes from prior study are noted. 09/06/2020: LVEF 60-65%, grade 1 DD. FINDINGS  Left Ventricle: Left ventricular ejection fraction, by estimation, is 55 to 60%. The left ventricle has normal  function. The left ventricle has no regional wall motion abnormalities. The left ventricular internal cavity size was normal in size. There is  no left ventricular hypertrophy. Left  ventricular diastolic parameters were normal. Right Ventricle: The right ventricular size is normal. No increase in right ventricular wall thickness. Right ventricular systolic function is normal. Left Atrium: Left atrial size was mildly dilated. Right Atrium: Right atrial size was normal in size. Pericardium: There is no evidence of pericardial effusion. Mitral Valve: The mitral valve is grossly normal. Trivial mitral valve regurgitation. Tricuspid Valve: The tricuspid valve is grossly normal. Tricuspid valve regurgitation is trivial. Aortic Valve: The aortic valve is tricuspid. Aortic valve regurgitation is not visualized. Pulmonic Valve: The pulmonic valve was normal in structure. Pulmonic valve regurgitation is not visualized. Aorta: The aortic root and ascending aorta are structurally normal, with no evidence of dilitation. Venous: The inferior vena cava is normal in size with greater than 50% respiratory variability, suggesting right atrial pressure of 3 mmHg. IAS/Shunts: No atrial level shunt detected by color flow Doppler.  LEFT VENTRICLE PLAX 2D LVIDd:         5.40 cm  Diastology LVIDs:         3.80 cm  LV e' medial:    9.57 cm/s LV PW:         1.20 cm  LV E/e' medial:  6.9 LV IVS:        0.90 cm  LV e' lateral:   13.30 cm/s LVOT diam:     2.00 cm  LV E/e' lateral: 5.0 LVOT Area:     3.14 cm  RIGHT VENTRICLE RV S prime:     10.80 cm/s TAPSE (M-mode): 2.2 cm LEFT ATRIUM             Index       RIGHT ATRIUM           Index LA diam:        3.80 cm 2.04 cm/m  RA Area:     13.10 cm LA Vol (A2C):   77.9 ml 41.82 ml/m RA Volume:   29.60 ml  15.89 ml/m LA Vol (A4C):   60.0 ml 32.21 ml/m LA Biplane Vol: 69.8 ml 37.47 ml/m   AORTA Ao Root diam: 2.70 cm MITRAL VALVE MV Area (PHT): 1.87 cm    SHUNTS MV Decel Time: 405 msec    Systemic Diam: 2.00 cm MV E velocity: 66.00 cm/s MV A velocity: 54.80 cm/s MV E/A ratio:  1.20 Lyman Bishop MD Electronically signed by Lyman Bishop MD Signature Date/Time:  10/17/2020/4:19:47 PM    Final         Scheduled Meds: . bisacodyl  10 mg Oral Daily  . Chlorhexidine Gluconate Cloth  6 each Topical Daily  . enoxaparin (LOVENOX) injection  40 mg Subcutaneous Q24H  . feeding supplement  237 mL Oral BID BM  . polyethylene glycol  17 g Oral BID  . senna-docusate  2 tablet Oral BID   Continuous Infusions: . sodium chloride 100 mL/hr at 10/19/20 0935     LOS: 4 days   Georgette Shell, MD  10/19/2020, 12:34 PM

## 2020-10-20 DIAGNOSIS — R0602 Shortness of breath: Secondary | ICD-10-CM | POA: Diagnosis not present

## 2020-10-20 DIAGNOSIS — R7989 Other specified abnormal findings of blood chemistry: Secondary | ICD-10-CM | POA: Diagnosis not present

## 2020-10-20 LAB — CBC
HCT: 28.6 % — ABNORMAL LOW (ref 39.0–52.0)
Hemoglobin: 8.8 g/dL — ABNORMAL LOW (ref 13.0–17.0)
MCH: 27.8 pg (ref 26.0–34.0)
MCHC: 30.8 g/dL (ref 30.0–36.0)
MCV: 90.5 fL (ref 80.0–100.0)
Platelets: 292 10*3/uL (ref 150–400)
RBC: 3.16 MIL/uL — ABNORMAL LOW (ref 4.22–5.81)
RDW: 18.1 % — ABNORMAL HIGH (ref 11.5–15.5)
WBC: 10.7 10*3/uL — ABNORMAL HIGH (ref 4.0–10.5)
nRBC: 0 % (ref 0.0–0.2)

## 2020-10-20 LAB — COMPREHENSIVE METABOLIC PANEL
ALT: 20 U/L (ref 0–44)
AST: 25 U/L (ref 15–41)
Albumin: 3.5 g/dL (ref 3.5–5.0)
Alkaline Phosphatase: 47 U/L (ref 38–126)
Anion gap: 9 (ref 5–15)
BUN: 29 mg/dL — ABNORMAL HIGH (ref 8–23)
CO2: 25 mmol/L (ref 22–32)
Calcium: 11.6 mg/dL — ABNORMAL HIGH (ref 8.9–10.3)
Chloride: 105 mmol/L (ref 98–111)
Creatinine, Ser: 1.73 mg/dL — ABNORMAL HIGH (ref 0.61–1.24)
GFR, Estimated: 41 mL/min — ABNORMAL LOW (ref >60)
Glucose, Bld: 97 mg/dL (ref 70–99)
Potassium: 3.5 mmol/L (ref 3.5–5.1)
Sodium: 139 mmol/L (ref 135–145)
Total Bilirubin: 0.5 mg/dL (ref 0.3–1.2)
Total Protein: 5.8 g/dL — ABNORMAL LOW (ref 6.5–8.1)

## 2020-10-20 MED ORDER — HEPARIN SOD (PORK) LOCK FLUSH 100 UNIT/ML IV SOLN
500.0000 [IU] | INTRAVENOUS | Status: AC | PRN
  Administered 2020-10-21: 500 [IU]
  Filled 2020-10-20 (×2): qty 5

## 2020-10-20 MED ORDER — AMLODIPINE BESYLATE 2.5 MG PO TABS: 2.5000 mg | ORAL_TABLET | Freq: Every day | ORAL | 2 refills | Status: DC

## 2020-10-20 MED ORDER — FUROSEMIDE 10 MG/ML IJ SOLN
40.0000 mg | Freq: Once | INTRAMUSCULAR | Status: AC
  Administered 2020-10-20: 40 mg via INTRAVENOUS
  Filled 2020-10-20: qty 4

## 2020-10-20 MED ORDER — PREDNISONE 20 MG PO TABS
80.0000 mg | ORAL_TABLET | Freq: Once | ORAL | Status: AC
  Administered 2020-10-20: 80 mg via ORAL
  Filled 2020-10-20: qty 4

## 2020-10-20 MED ORDER — PREDNISONE 10 MG PO TABS: ORAL_TABLET | ORAL | 0 refills | Status: DC

## 2020-10-20 MED FILL — Zoledronic Acid Inj Conc For IV Infusion 4 MG/5ML: INTRAVENOUS | Qty: 5 | Status: AC

## 2020-10-20 MED FILL — Sodium Chloride IV Soln 0.9%: INTRAVENOUS | Qty: 100 | Status: AC

## 2020-10-20 NOTE — Care Management Important Message (Signed)
Important Message  Patient Details IM Letter given to the Patient Name: Connor Adams MRN: [id number] Date of Birth: 1946-07-22   Medicare Important Message Given:  Yes     [name] 10/20/2020, 3:17 PM

## 2020-10-20 NOTE — Progress Notes (Signed)
PROGRESS NOTE    Connor Adams  XBM:[id number] DOB: Jan 06, 1946 DOA: 10/15/2020 PCP: Shirline Frees, MD    Brief Narrative: 75 year old male with history of diffuse large B-cell lymphoma followed by Dr. Lorenso Courier on chemotherapy.  He finished his last chemo in December 2021 and follow-up PET scan showed remission. He was admitted with generalized weakness poor appetite poor p.o. intake constipation dyspnea on exertion.  He was found to have hypercalcemia and was admitted for further management.  Assessment & Plan:   Active Problems:   Hypercalcemia   Elevated serum creatinine  #1 hypercalcemia-? Etiology.  Upon admission his corrected calcium was over 15.  He has received IV fluids since admission.  Work-up shows very elevated 125 dihydroxy vitamin D is 197.0.  PTH is low.  Phosphorus is normal.  Alkaline phosphatase is normal. Patient and his wife tells me that he does not take any supplements at home other than a regular multivitamin. He has ongoing constipation, which has been resolved since he has been on multiple stool softeners and laxatives.  Chest x-ray shows no acute findings.  Echo with normal ejection fraction.   Patient is being treated with IV Lasix and IV fluids.  He is calcium is trending down.  Patient would like to stay another night and go home tomorrow.  Discussed with Dr. Marin Olp.  Prednisone 80 mg given today.  Plan on discharging home on prednisone. Normal cortisol levels.    #2 AKI due to decreased p.o. intake dehydration improving creatinine on IV fluids.  Renal ultrasound done 10/17/2020 with no acute findings other than 2 cysts.  #3 hypertension continue current meds  #4 history of TIA continue home meds  #5 constipation resolved continue miralax senna dulcolax  #6 elevated vitamin D 125 dihydroxy vitamin D at 197.  Reference ranges between 19 and 79.  Patient denies taking any supplements at home other than a regular multivitamin. Need to rule out  granulomatous disease lymphoma etc.  #7 hypoxia patient positive over 8 L on IV fluids will give lasix 60 mg.chest xray clear.   Estimated body mass index is 24.45 kg/m as calculated from the following:   Height as of this encounter: 5\' 8"  (1.727 m).   Weight as of this encounter: 72.9 kg.  DVT prophylaxis: Lovenox Code Status: Full code Family Communication: Discussed with patient's wife  disposition Plan:  Status is: Inpatient  Dispo: The patient is from: Home              Anticipated d/c is to: Home              Anticipated d/c date is: 1 day              Patient currently is not medically stable to d/c.   Difficult to place patient na  Consultants:   onc  Procedures:none Antimicrobials:none  Subjective: Breathing better constipation improved on stool softeners Anxious to go home however nervous to go home and asking to stay another night.  Objective: Vitals:   10/19/20 1351 10/19/20 2135 10/20/20 0559 10/20/20 1500  BP: (!) 143/66 125/79 (!) 141/57 127/66  Pulse: 62 63 63 63  Resp: 16 17 18 16   Temp: 98.8 F (37.1 C) 98.1 F (36.7 C) 98.7 F (37.1 C) 98 F (36.7 C)  TempSrc: Oral Oral Oral Oral  SpO2: (!) 87% 97% 93% 95%  Weight:      Height:        Intake/Output Summary (Last 24 hours) at 10/20/2020 1509  Last data filed at 10/20/2020 0950 Gross per 24 hour  Intake 2511.26 ml  Output 1700 ml  Net 811.26 ml   Filed Weights   10/15/20 1527  Weight: 72.9 kg    Examination:  General exam: Appears calm and comfortable  Respiratory system: Diminished breath sounds at the bases to auscultation. Respiratory effort normal. Cardiovascular system: S1 & S2 heard, RRR. No JVD, murmurs, rubs, gallops or clicks. No pedal edema. Gastrointestinal system: Abdomen is nondistended, soft and nontender. No organomegaly or masses felt. Normal bowel sounds heard. Central nervous system: Alert and oriented. No focal neurological deficits. Extremities: Symmetric 5 x 5  power. Skin: No rashes, lesions or ulcers Psychiatry: Judgement and insight appear normal. Mood & affect appropriate.     Data Reviewed: I have personally reviewed following labs and imaging studies  CBC: Recent Labs  Lab 10/15/20 0820 10/20/20 0741  WBC 11.7* 10.7*  NEUTROABS 8.0*  --   HGB 9.2* 8.8*  HCT 30.2* 28.6*  MCV 90.7 90.5  PLT 319 270   Basic Metabolic Panel: Recent Labs  Lab 10/15/20 1650 10/16/20 0519 10/17/20 0558 10/18/20 0744 10/19/20 0539 10/20/20 0741  NA  --  140 139 143 140 139  K  --  4.0 3.6 3.5 3.2* 3.5  CL  --  107 109 110 106 105  CO2  --  27 26 26 26 25   GLUCOSE  --  105* 98 101* 98 97  BUN  --  42* 34* 38* 33* 29*  CREATININE  --  1.84* 1.94* 1.76* 1.66* 1.73*  CALCIUM 12.9* 13.3* 12.2* 12.2* 11.8* 11.6*  PHOS 4.3  --   --   --   --   --    GFR: Estimated Creatinine Clearance: 36.2 mL/min (A) (by C-G formula based on SCr of 1.73 mg/dL (H)). Liver Function Tests: Recent Labs  Lab 10/16/20 0519 10/17/20 0558 10/18/20 0744 10/19/20 0539 10/20/20 0741  AST 26 27 29 27 25   ALT 15 17 19 20 20   ALKPHOS 42 41 46 41 47  BILITOT 0.5 0.5 0.4 0.2* 0.5  PROT 5.1* 5.0* 5.4* 5.2* 5.8*  ALBUMIN 2.9* 2.8* 3.3* 3.1* 3.5   No results for input(s): LIPASE, AMYLASE in the last 168 hours. No results for input(s): AMMONIA in the last 168 hours. Coagulation Profile: No results for input(s): INR, PROTIME in the last 168 hours. Cardiac Enzymes: No results for input(s): CKTOTAL, CKMB, CKMBINDEX, TROPONINI in the last 168 hours. BNP (last 3 results) No results for input(s): PROBNP in the last 8760 hours. HbA1C: No results for input(s): HGBA1C in the last 72 hours. CBG: No results for input(s): GLUCAP in the last 168 hours. Lipid Profile: No results for input(s): CHOL, HDL, LDLCALC, TRIG, CHOLHDL, LDLDIRECT in the last 72 hours. Thyroid Function Tests: No results for input(s): TSH, T4TOTAL, FREET4, T3FREE, THYROIDAB in the last 72 hours. Anemia  Panel: No results for input(s): VITAMINB12, FOLATE, FERRITIN, TIBC, IRON, RETICCTPCT in the last 72 hours. Sepsis Labs: No results for input(s): PROCALCITON, LATICACIDVEN in the last 168 hours.  Recent Results (from the past 240 hour(s))  SARS Coronavirus 2 (TAT 6-24 hrs)     Status: None   Collection Time: 10/15/20  9:40 AM   Specimen: Nasopharyngeal Swab  Result Value Ref Range Status   SARS Coronavirus 2 NEGATIVE NEGATIVE Final    Comment: (NOTE) SARS-CoV-2 target nucleic acids are NOT DETECTED.  The SARS-CoV-2 RNA is generally detectable in upper and lower respiratory specimens during the acute  phase of infection. Negative results do not preclude SARS-CoV-2 infection, do not rule out co-infections with other pathogens, and should not be used as the sole basis for treatment or other patient management decisions. Negative results must be combined with clinical observations, patient history, and epidemiological information. The expected result is Negative.  Fact Sheet for Patients: SugarRoll.be  Fact Sheet for Healthcare Providers: https://www.woods-Zyniah Ferraiolo.com/  This test is not yet approved or cleared by the [place] FDA and  has been authorized for detection and/or diagnosis of SARS-CoV-2 by FDA under an Emergency Use Authorization (EUA). This EUA will remain  in effect (meaning this test can be used) for the duration of the COVID-19 declaration under Se ction 564(b)(1) of the Act, 21 U.S.C. section 360bbb-3(b)(1), unless the authorization is terminated or revoked sooner.  Performed at [facility], [place] [address], [place], [place] [address]          Radiology Studies: No results found.      Scheduled Meds: . bisacodyl  10 mg Oral Daily  . Chlorhexidine Gluconate Cloth  6 each Topical Daily  . enoxaparin (LOVENOX) injection  40 mg Subcutaneous Q24H  . feeding supplement  237 mL Oral BID BM  . polyethylene  glycol  17 g Oral BID  . senna-docusate  2 tablet Oral BID   Continuous Infusions: . sodium chloride 100 mL/hr at 10/20/20 0541     LOS: 5 days   Georgette Shell, MD  10/20/2020, 3:09 PM

## 2020-10-21 DIAGNOSIS — R7989 Other specified abnormal findings of blood chemistry: Secondary | ICD-10-CM | POA: Diagnosis not present

## 2020-10-21 DIAGNOSIS — R0602 Shortness of breath: Secondary | ICD-10-CM | POA: Diagnosis not present

## 2020-10-21 LAB — COMPREHENSIVE METABOLIC PANEL
ALT: 22 U/L (ref 0–44)
AST: 24 U/L (ref 15–41)
Albumin: 3.6 g/dL (ref 3.5–5.0)
Alkaline Phosphatase: 48 U/L (ref 38–126)
Anion gap: 11 (ref 5–15)
BUN: 41 mg/dL — ABNORMAL HIGH (ref 8–23)
CO2: 24 mmol/L (ref 22–32)
Calcium: 11.6 mg/dL — ABNORMAL HIGH (ref 8.9–10.3)
Chloride: 107 mmol/L (ref 98–111)
Creatinine, Ser: 1.72 mg/dL — ABNORMAL HIGH (ref 0.61–1.24)
GFR, Estimated: 41 mL/min — ABNORMAL LOW (ref >60)
Glucose, Bld: 187 mg/dL — ABNORMAL HIGH (ref 70–99)
Potassium: 3.4 mmol/L — ABNORMAL LOW (ref 3.5–5.1)
Sodium: 142 mmol/L (ref 135–145)
Total Bilirubin: 0.5 mg/dL (ref 0.3–1.2)
Total Protein: 6.2 g/dL — ABNORMAL LOW (ref 6.5–8.1)

## 2020-10-21 MED ORDER — PREDNISONE 20 MG PO TABS: 40.0000 mg | ORAL_TABLET | Freq: Every day | ORAL | 0 refills | Status: DC

## 2020-10-21 MED ORDER — PREDNISONE 20 MG PO TABS
40.0000 mg | ORAL_TABLET | Freq: Every day | ORAL | Status: DC
  Administered 2020-10-21: 40 mg via ORAL

## 2020-10-21 MED ORDER — PREDNISONE 20 MG PO TABS
40.0000 mg | ORAL_TABLET | Freq: Every day | ORAL | Status: DC
  Filled 2020-10-21: qty 2

## 2020-10-21 NOTE — Discharge Summary (Signed)
Physician Discharge Summary  Connor Adams [id number] DOB: [birth year] DOA: 10/15/2020  PCP: Shirline Frees, MD  Admit date: 10/15/2020 Discharge date: 10/21/2020  Admitted From: HOME Disposition:  HOME Recommendations for Outpatient Follow-up:  1. Follow up with PCP in 1-2 weeks 2. Please obtain BMP/CBC in one week 3. Please follow up WITH DR [facility] Equipment/Devices NONE  Discharge Condition STABLE CODE STATUS FULL Diet recommendation:CARDIAC  Brief/Interim Summary:75 year old male with history of diffuse large B-cell lymphoma followed by Dr. Lorenso Courier on chemotherapy.  He finished his last chemo in December 2021 and follow-up PET scan showed remission. He was admitted with generalized weakness poor appetite poor p.o. intake constipation dyspnea on exertion.  He was found to have hypercalcemia and was admitted for further management.   Discharge Diagnoses:  Active Problems:   Hypercalcemia   Elevated serum creatinine   SOB (shortness of breath)   #1 hypercalcemia-? Etiology.  Upon admission his corrected calcium was over 15.  Patient has received IV fluids, Lasix, Zometa and prednisone.  He will be discharged home on prednisone 40 mg daily for 10 days.  He will follow-up with Dr. Lorenso Courier on October 30, 2020.   Work-up showed  very elevated 125 dihydroxy vitamin D is 197.0.  PTH is low.  Phosphorus is normal.  Alkaline phosphatase is normal.  PTH related protein was low. Patient and his wife tells me that he does not take any supplements at home other than a regular multivitamin. He has ongoing constipation, which has been resolved since he has been on multiple stool softeners and laxatives.  Chest x-ray shows no acute findings.  Echo with normal ejection fraction.   Normal cortisol levels.   Calcium on the day of discharge was 11.6. Hypercalcemia was thought to be related to granulomatous disease versus lymphoma.  Follow-up with oncology.  #2 AKI due to  decreased p.o. intake dehydration.  Treated with IV fluids.  Creatinine on the day of discharge was 1.72.  Renal ultrasound done 10/17/2020 with no acute findings other than 2 cysts. Patient probably has some CKD which is not documented.  His creatinine clearance was 36.5.  #3 hypertension continue current meds  #4 history of TIA continue home meds  #5 constipation resolved continue miralax senna dulcolax  #6 elevated vitamin D 125 dihydroxy vitamin D at 197.  Reference ranges between 19 and 79.  Patient denies taking any supplements at home other than a regular multivitamin. ? granulomatous disease lymphoma etc.  #7 hypoxia -improved with Lasix and checking pulse ox in his earlobe.  He was ambulatory asymptomatic on the day of discharge with normal saturation on room air.   Nutrition Problem: Moderate Malnutrition Etiology: cancer and cancer related treatments,chronic illness    Signs/Symptoms: percent weight loss,moderate muscle depletion,mild fat depletion (7% weight loss in 1 month.) Percent weight loss: 7 %     Interventions: Refer to RD note for recommendations,Prostat,Snacks  Estimated body mass index is 24.45 kg/m as calculated from the following:   Height as of this encounter: 5\' 8"  (1.727 m).   Weight as of this encounter: 72.9 kg.  Discharge Instructions  Discharge Instructions    Call MD for:  difficulty breathing, headache or visual disturbances   Complete by: As directed    Call MD for:  persistant dizziness or light-headedness   Complete by: As directed    Call MD for:  redness, tenderness, or signs of infection (pain, swelling, redness, odor or green/yellow discharge around incision site)  Complete by: As directed    Diet - low sodium heart healthy   Complete by: As directed    Diet - low sodium heart healthy   Complete by: As directed    Increase activity slowly   Complete by: As directed    Increase activity slowly   Complete by: As directed       Allergies as of 10/21/2020   No Known Allergies     Medication List    STOP taking these medications   losartan 25 MG tablet Commonly known as: COZAAR     TAKE these medications   acetaminophen 325 MG tablet Commonly known as: TYLENOL Take 2 tablets (650 mg total) by mouth every 6 (six) hours as needed for mild pain, moderate pain or fever.   amLODipine 2.5 MG tablet Commonly known as: NORVASC Take 1 tablet (2.5 mg total) by mouth daily.   aspirin EC 81 MG tablet Take 81 mg by mouth daily. Swallow whole.   feeding supplement Liqd Take 237 mLs by mouth 2 (two) times daily between meals.   fluticasone 50 MCG/ACT nasal spray Commonly known as: FLONASE Place 1-2 sprays into both nostrils daily as needed for allergies or rhinitis.   hydrocortisone 2.5 % rectal cream Commonly known as: ANUSOL-HC Place 1 application rectally daily as needed. What changed: reasons to take this   Multi Vitamin Tabs Take 1 tablet by mouth daily.   mupirocin cream 2 % Commonly known as: BACTROBAN Apply 1 application topically daily.   polyethylene glycol 17 g packet Commonly known as: MIRALAX / GLYCOLAX Take 17 g by mouth daily as needed for moderate constipation.   polyvinyl alcohol 1.4 % ophthalmic solution Commonly known as: LIQUIFILM TEARS Place 1 drop into both eyes daily as needed for dry eyes.   predniSONE 20 MG tablet Commonly known as: DELTASONE Take 2 tablets (40 mg total) by mouth daily with breakfast. Start taking on: October 22, 2020   rosuvastatin 5 MG tablet Commonly known as: CRESTOR Take 5 mg by mouth daily.   senna-docusate 8.6-50 MG tablet Commonly known as: Senokot-S Take 2 tablets by mouth 2 (two) times daily as needed for mild constipation.       Follow-up Information    Shirline Frees, MD Follow up.   Specialty: Family Medicine Contact information: [place] [address] [phone]        Orson Slick, MD Follow up.    Specialty: Hematology and Oncology Contact information: [place] [address] [phone]              No Known Allergies  Consultations: Oncology  Procedures/Studies: DG Chest 1 View  Result Date: 10/17/2020 CLINICAL DATA:  B-cell lymphoma.  Shortness of breath EXAM: CHEST  1 VIEW COMPARISON:  01/05/2014 FINDINGS: Right Port-A-Cath in place with the tip at the cavoatrial junction. Heart and mediastinal contours are within normal limits. No focal opacities or effusions. No acute bony abnormality. Old healed left rib fractures. IMPRESSION: No active cardiopulmonary disease. Electronically Signed   By: Rolm Baptise M.D.   On: 10/17/2020 15:08   US RENAL  Result Date: 10/17/2020 CLINICAL DATA:  Elevated creatinine EXAM: RENAL / URINARY TRACT ULTRASOUND COMPLETE COMPARISON:  CT abdomen and pelvis April 17, 2020. FINDINGS: Right Kidney: Renal measurements: 12.8 x 7.0 x 4.5 cm = volume: 213 mL. Echogenicity within normal limits. 1 cm interpolar renal cyst. No solid mass or hydronephrosis visualized. Left Kidney: Renal measurements:  12.7 x 6.5 x 6.4 cm = volume: 279 mL. Echogenicity within normal limits. 1.5 cm lower pole renal cyst. No solid mass or hydronephrosis visualized. Bladder: Appears normal for degree of bladder distention. Bilateral ureteral jets visualized. Other: None. IMPRESSION: 1. No hydronephrosis. 2. Small bilateral renal cysts. Electronically Signed   By: Dahlia Bailiff MD   On: 10/17/2020 08:21   NM PET Image Restag (PS) Skull Base To Thigh  Result Date: 10/06/2020 CLINICAL DATA:  Subsequent treatment strategy for diffuse B-cell lymphoma. EXAM: NUCLEAR MEDICINE PET SKULL BASE TO THIGH TECHNIQUE: 8.4 mCi F-18 FDG was injected intravenously. Full-ring PET imaging was performed from the skull base to thigh after the radiotracer. CT data was obtained and used for attenuation correction and anatomic localization. Fasting blood glucose: 114 mg/dl COMPARISON:   PET-CT 08/15/2020 and 04/28/2020. FINDINGS: Mediastinal blood pool activity: SUV max 1.9 Liver activity: SUV max 3.2 NECK: No hypermetabolic cervical lymph nodes are identified.Asymmetrically increased metabolic activity along the right aspect of the vocal cord, similar to the most recent previous study (SUV max 7.7). This could be physiologic or relate to contralateral vocal cord paralysis. No other lesions of the pharyngeal mucosal space. Incidental CT findings: none CHEST: Mildly increased metabolic activity medially in the AP window, corresponding with a 1.1 cm short axis lymph node on image 74/4 (SUV max 4.4). No other hypermetabolic mediastinal, hilar or axillary lymph nodes. Interval improvement in the multifocal pulmonary nodularity which had progressed on the most recent study. There is associated mild hypermetabolic activity which has generally improved. Representative lesions include a 6 mm left upper lobe nodule on image 23/8 (SUV max 2.2), previously 8 mm and SUV max 4.9; 7 mm medial left upper lobe nodule on image 18/8 (SUV max 3.6), previously 9 mm and SUV max 4.8; and 7 mm right middle lobe nodule on image 42/8 (SUV max 1.4), previously 10 mm an SUV max 7.1. The patchy nodular airspace opacities in the left lower lobe have improved, likely inflammatory. No new or enlarging nodules. Incidental CT findings: Right IJ Port-A-Cath extends to the mid right atrium. Mild atherosclerosis of the aorta, great vessels and coronary arteries. Stable small pericardial effusion. No significant pleural fluid. ABDOMEN/PELVIS: There is no hypermetabolic activity within the liver, adrenal glands, spleen or pancreas. The spleen is normal in size without hypermetabolic activity (SUV max 3.2). The dominant mesenteric mass has decreased in size, measuring approximately 5.8 x 3.8 cm on image 135/4 (previously 7.1 x 3.8 cm). This demonstrates metabolic activity similar to background bowel activity (SUV max 4.3). There is a  persistent hypermetabolic left mesenteric node measuring 1.9 x 0.9 cm on image 142/4 (SUV max 5.5). This previously was less well-defined and smaller, measuring 1.7 x 0.8 cm with an SUV max of 3.8. No other hypermetabolic lymph nodes are seen. Incidental CT findings: Multiple nonobstructing renal calculi bilaterally. Calcified granulomas in the spleen and mild aortic atherosclerosis. SKELETON: There is no hypermetabolic activity to suggest osseous metastatic disease. The diffusely increased marrow uptake on the prior study has resolved, attributed to treatment related changes. 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 Healing fractures of the left 7th and 8th ribs posteriorly. IMPRESSION: 1. Further decreased size of dominant mesenteric mass with metabolic activity similar to background bowel ([place] 3/4). 2. Increased size and increased hypermetabolic activity within a single left anterior mesenteric lymph node ([place] 4/5). 3. Interval improvement in the size and metabolic activity of multiple previously demonstrated  pulmonary nodules. It is uncertain as to whether these relate to the patient's lymphoma or superimposed inflammation/infection. 4. Single low level hypermetabolic activity within an AP window lymph node, probably reactive. 5. 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 Correlate clinically. Electronically Signed   By: Richardean Sale M.D.   On: 10/06/2020 10:05   ECHOCARDIOGRAM COMPLETE  Result Date: 10/17/2020    ECHOCARDIOGRAM REPORT   Patient Name:   Y[facility]WEH POLLOK [facility] Date of Exam: 10/17/2020 Medical Rec #:  [id number]        Height:       68.0 in Accession #:    [id number]       Weight:       160.8 lb Date of Birth:  1945/10/14        BSA:          1.863 m Patient Age:    75 years         BP:           149/68 mmHg Patient Gender: M                HR:           61  bpm. Exam Location:  Inpatient Procedure: 2D Echo Indications:    Abnormal ECG R94.31  History:        Patient has prior history of Echocardiogram examinations, most                 recent 09/06/2020. Risk Factors:Hypertension, Dyslipidemia and                 Non-Smoker.  Sonographer:    Leavy Cella Referring Phys: [id number] [place]  1. Left ventricular ejection fraction, by estimation, is 55 to 60%. The left ventricle has normal function. The left ventricle has no regional wall motion abnormalities. Left ventricular diastolic parameters were normal.  2. Right ventricular systolic function is normal. The right ventricular size is normal.  3. Left atrial size was mildly dilated.  4. The mitral valve is grossly normal. Trivial mitral valve regurgitation.  5. The aortic valve is tricuspid. Aortic valve regurgitation is not visualized.  6. The inferior vena cava is normal in size with greater than 50% respiratory variability, suggesting right atrial pressure of 3 mmHg. Comparison(s): Changes from prior study are noted. 09/06/2020: LVEF 60-65%, grade 1 DD. FINDINGS  Left Ventricle: Left ventricular ejection fraction, by estimation, is 55 to 60%. The left ventricle has normal function. The left ventricle has no regional wall motion abnormalities. The left ventricular internal cavity size was normal in size. There is  no left ventricular hypertrophy. Left ventricular diastolic parameters were normal. Right Ventricle: The right ventricular size is normal. No increase in right ventricular wall thickness. Right ventricular systolic function is normal. Left Atrium: Left atrial size was mildly dilated. Right Atrium: Right atrial size was normal in size. Pericardium: There is no evidence of pericardial effusion. Mitral Valve: The mitral valve is grossly normal. Trivial mitral valve regurgitation. Tricuspid Valve: The tricuspid valve is grossly normal. Tricuspid valve regurgitation is trivial. Aortic  Valve: The aortic valve is tricuspid. Aortic valve regurgitation is not visualized. Pulmonic Valve: The pulmonic valve was normal in structure. Pulmonic valve regurgitation is not visualized. Aorta: The aortic root and ascending aorta are structurally normal, with no evidence of dilitation. Venous: The inferior vena cava is normal in size with greater than 50% respiratory variability, suggesting right  atrial pressure of 3 mmHg. IAS/Shunts: No atrial level shunt detected by color flow Doppler.  LEFT VENTRICLE PLAX 2D LVIDd:         5.40 cm  Diastology LVIDs:         3.80 cm  LV e' medial:    9.57 cm/s LV PW:         1.20 cm  LV E/e' medial:  6.9 LV IVS:        0.90 cm  LV e' lateral:   13.30 cm/s LVOT diam:     2.00 cm  LV E/e' lateral: 5.0 LVOT Area:     3.14 cm  RIGHT VENTRICLE RV S prime:     10.80 cm/s TAPSE (M-mode): 2.2 cm LEFT ATRIUM             Index       RIGHT ATRIUM           Index LA diam:        3.80 cm 2.04 cm/m  RA Area:     13.10 cm LA Vol (A2C):   77.9 ml 41.82 ml/m RA Volume:   29.60 ml  15.89 ml/m LA Vol (A4C):   60.0 ml 32.21 ml/m LA Biplane Vol: 69.8 ml 37.47 ml/m   AORTA Ao Root diam: 2.70 cm MITRAL VALVE MV Area (PHT): 1.87 cm    SHUNTS MV Decel Time: 405 msec    Systemic Diam: 2.00 cm MV E velocity: 66.00 cm/s MV A velocity: 54.80 cm/s MV E/A ratio:  1.20 Lyman Bishop MD Electronically signed by Lyman Bishop MD Signature Date/Time: 10/17/2020/4:19:47 PM    Final     (Echo, Carotid, EGD, Colonoscopy, ERCP)    Subjective: Very pleasant young man in no acute distress anxious to go home  Discharge Exam: Vitals:   10/20/20 2124 10/21/20 0616  BP: 121/75 116/69  Pulse: (!) 59 (!) 57  Resp: 16 16  Temp: 98.2 F (36.8 C) 97.8 F (36.6 C)  SpO2: 96% 96%   Vitals:   10/20/20 0559 10/20/20 1500 10/20/20 2124 10/21/20 0616  BP: (!) 141/57 127/66 121/75 116/69  Pulse: 63 63 (!) 59 (!) 57  Resp: 18 16 16 16   Temp: 98.7 F (37.1 C) 98 F (36.7 C) 98.2 F (36.8 C) 97.8 F  (36.6 C)  TempSrc: Oral Oral Oral Oral  SpO2: 93% 95% 96% 96%  Weight:      Height:        General: Pt is alert, awake, not in acute distress Cardiovascular: RRR, S1/S2 +, no rubs, no gallops Respiratory: CTA bilaterally, no wheezing, no rhonchi Abdominal: Soft, NT, ND, bowel sounds + Extremities: no edema, no cyanosis    The results of significant diagnostics from this hospitalization (including imaging, microbiology, ancillary and laboratory) are listed below for reference.     Microbiology: Recent Results (from the past 240 hour(s))  SARS Coronavirus 2 (TAT 6-24 hrs)     Status: None   Collection Time: 10/15/20  9:40 AM   Specimen: Nasopharyngeal Swab  Result Value Ref Range Status   SARS Coronavirus 2 NEGATIVE NEGATIVE Final    Comment: (NOTE) SARS-CoV-2 target nucleic acids are NOT DETECTED.  The SARS-CoV-2 RNA is generally detectable in upper and lower respiratory specimens during the acute phase of infection. Negative results do not preclude SARS-CoV-2 infection, do not rule out co-infections with other pathogens, and should not be used as the sole basis for treatment or other patient management decisions. Negative results must be combined  with clinical observations, patient history, and epidemiological information. The expected result is Negative.  Fact Sheet for Patients: SugarRoll.be  Fact Sheet for Healthcare Providers: https://www.woods-mathews.com/  This test is not yet approved or cleared by the [place] FDA and  has been authorized for detection and/or diagnosis of SARS-CoV-2 by FDA under an Emergency Use Authorization (EUA). This EUA will remain  in effect (meaning this test can be used) for the duration of the COVID-19 declaration under Se ction 564(b)(1) of the Act, 21 U.S.C. section 360bbb-3(b)(1), unless the authorization is terminated or revoked sooner.  Performed at [facility], [place]  [address] [place],  [address]      Labs: BNP (last 3 results) No results for input(s): BNP in the last 8760 hours. Basic Metabolic Panel: Recent Labs  Lab 10/15/20 1650 10/16/20 0519 10/17/20 0558 10/18/20 0744 10/19/20 0539 10/20/20 0741 10/21/20 1003  NA  --    < > 139 143 140 139 142  K  --    < > 3.6 3.5 3.2* 3.5 3.4*  CL  --    < > 109 110 106 105 107  CO2  --    < > 26 26 26 25 24   GLUCOSE  --    < > 98 101* 98 97 187*  BUN  --    < > 34* 38* 33* 29* 41*  CREATININE  --    < > 1.94* 1.76* 1.66* 1.73* 1.72*  CALCIUM 12.9*   < > 12.2* 12.2* 11.8* 11.6* 11.6*  PHOS 4.3  --   --   --   --   --   --    < > = values in this interval not displayed.   Liver Function Tests: Recent Labs  Lab 10/17/20 0558 10/18/20 0744 10/19/20 0539 10/20/20 0741 10/21/20 1003  AST 27 29 27 25 24   ALT 17 19 20 20 22   ALKPHOS 41 46 41 47 48  BILITOT 0.5 0.4 0.2* 0.5 0.5  PROT 5.0* 5.4* 5.2* 5.8* 6.2*  ALBUMIN 2.8* 3.3* 3.1* 3.5 3.6   No results for input(s): LIPASE, AMYLASE in the last 168 hours. No results for input(s): AMMONIA in the last 168 hours. CBC: Recent Labs  Lab 10/15/20 0820 10/20/20 0741  WBC 11.7* 10.7*  NEUTROABS 8.0*  --   HGB 9.2* 8.8*  HCT 30.2* 28.6*  MCV 90.7 90.5  PLT 319 292   Cardiac Enzymes: No results for input(s): CKTOTAL, CKMB, CKMBINDEX, TROPONINI in the last 168 hours. BNP: Invalid input(s): POCBNP CBG: No results for input(s): GLUCAP in the last 168 hours. D-Dimer No results for input(s): DDIMER in the last 72 hours. Hgb A1c No results for input(s): HGBA1C in the last 72 hours. Lipid Profile No results for input(s): CHOL, HDL, LDLCALC, TRIG, CHOLHDL, LDLDIRECT in the last 72 hours. Thyroid function studies No results for input(s): TSH, T4TOTAL, T3FREE, THYROIDAB in the last 72 hours.  Invalid input(s): FREET3 Anemia work up No results for input(s): VITAMINB12, FOLATE, FERRITIN, TIBC, IRON, RETICCTPCT in the last 72  hours. Urinalysis    Component Value Date/Time   COLORURINE YELLOW 12/03/2016 1138   APPEARANCEUR CLEAR 12/03/2016 1138   LABSPEC 1.018 12/03/2016 1138   PHURINE 6.0 12/03/2016 [place] 12/03/2016 1138   [place] 12/03/2016 1138   [place] 12/03/2016 [place] 12/03/2016 1138   PROTEINUR NEGATIVE 12/03/2016 1138   UROBILINOGEN 0.2 01/05/2014 1155   NITRITE NEGATIVE 12/03/2016 1138  LEUKOCYTESUR NEGATIVE 12/03/2016 1138   Sepsis Labs Invalid input(s): PROCALCITONIN,  WBC,  LACTICIDVEN Microbiology Recent Results (from the past 240 hour(s))  SARS Coronavirus 2 (TAT 6-24 hrs)     Status: None   Collection Time: 10/15/20  9:40 AM   Specimen: Nasopharyngeal Swab  Result Value Ref Range Status   SARS Coronavirus 2 NEGATIVE NEGATIVE Final    Comment: (NOTE) SARS-CoV-2 target nucleic acids are NOT DETECTED.  The SARS-CoV-2 RNA is generally detectable in upper and lower respiratory specimens during the acute phase of infection. Negative results do not preclude SARS-CoV-2 infection, do not rule out co-infections with other pathogens, and should not be used as the sole basis for treatment or other patient management decisions. Negative results must be combined with clinical observations, patient history, and epidemiological information. The expected result is Negative.  Fact Sheet for Patients: SugarRoll.be  Fact Sheet for Healthcare Providers: https://www.woods-mathews.com/  This test is not yet approved or cleared by the [place] FDA and  has been authorized for detection and/or diagnosis of SARS-CoV-2 by FDA under an Emergency Use Authorization (EUA). This EUA will remain  in effect (meaning this test can be used) for the duration of the COVID-19 declaration under Se ction 564(b)(1) of the Act, 21 U.S.C. section 360bbb-3(b)(1), unless the authorization is terminated or revoked  sooner.  Performed at [facility], [place] [address], [place], [place] [address]      Time coordinating discharge:  39 minutes  SIGNED:   Georgette Shell, MD  [facility] 10/21/2020, 12:04 PM

## 2020-10-21 NOTE — Progress Notes (Signed)
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   1) 04/17/2020: CT A/P showed dominant nodal mass(11.5 x 10.9 cm)within the jejunal mesentery, highly suspicious for lymphoma. Additionally there are left lower lobe pleural-based pulmonary nodules 2) 04/24/2020: establish care with Dr. Lorenso Courier 3) 04/28/2020:  PET CT scan shows Large solid left mesenteric mass 13.0 cm with maximum SUV of 21.2, Deauville 5 with local hypermetabolic porta hepatis and retroperitoneal lymph nodes. Constitutes bulky Stage I disease.  4) 05/27/2020-05/31/2020: Cycle 1 of R-EPOCH 5) 06/16/2020-06/23/2020: Cycle 2 of R-EPOCH 6) 07/07/2020-07/11/2020: Cycle 3 of R-EPOCH  7) 07/28/2020-08/01/2020: Cycle 4 of R-EPOCH 8) 08/18/2020-08/22/2020: Cycle 5 of R-EPOCH 9) 09/08/2020-09/12/2020: Cycle 6 of R-EPOCH 10) 10/06/2020: PET CT scan shows decreased size of dominant mesenteric mass, however there was hypermetabolic activity within a single left anterior mesenteric lymph node   SUBJECTIVE: The patient was seen this morning and his wife is at the bedside.  Reports that he is feeling better today.  Now off oxygen.  Received dose of Zometa 10/18/2020.  He received a dose of prednisone 80 mg yesterday and hospitalist help but she plans to give 40 mg of prednisone today.  He is not having any cough, shortness of breath, chest pain, abdominal pain, nausea, vomiting.  For possible discharge to home later today.  Oncology History  DLBCL (diffuse large B cell lymphoma) ([place])  05/24/2020 Initial Diagnosis   DLBCL (diffuse large B cell lymphoma) ([place])   05/27/2020 -  Chemotherapy   The patient had dexamethasone (DECADRON) 4 MG tablet, 8 mg, Oral, 2 times daily with meals, 1 of 1 cycle, Start date: --, End date: -- pegfilgrastim-cbqv (UDENYCA) injection 6 mg, 6 mg, Subcutaneous, Once, 6  of 6 cycles Administration: 6 mg (06/02/2020), 6 mg (06/23/2020), 6 mg (07/14/2020), 6 mg (08/04/2020), 6 mg (08/25/2020), 6 mg (09/15/2020) DOXOrubicin (ADRIAMYCIN) 20 mg, etoposide (VEPESID) 96 mg, vinCRIStine (ONCOVIN) 0.8 mg in sodium chloride 0.9 % 1,000 mL chemo infusion, , Intravenous, Once, 6 of 6 cycles Administration:  (05/27/2020),  (05/28/2020),  (06/16/2020),  (06/17/2020),  (05/29/2020),  (05/30/2020),  (06/18/2020),  (06/19/2020),  (07/07/2020),  (07/08/2020),  (07/09/2020),  (07/10/2020),  (07/28/2020),  (07/29/2020),  (08/18/2020),  (08/19/2020),  (08/20/2020),  (08/21/2020),  (09/08/2020),  (09/09/2020),  (09/10/2020),  (09/11/2020),  (07/30/2020),  (07/31/2020) ondansetron (ZOFRAN) 8 mg, dexamethasone (DECADRON) 10 mg in sodium chloride 0.9 % 50 mL IVPB, , Intravenous,  Once, 6 of 6 cycles Administration: 8 mg (05/27/2020), 18 mg (05/28/2020), 16 mg (05/31/2020), 18 mg (06/16/2020), 18 mg (06/17/2020), 16 mg (06/20/2020), 18 mg (05/29/2020), 18 mg (05/30/2020), 18 mg (06/18/2020), 8 mg (06/19/2020), 18 mg (07/07/2020), 18 mg (07/08/2020), 36 mg (07/11/2020), 18 mg (07/09/2020), 8 mg (07/10/2020), 18 mg (07/28/2020), 18 mg (07/29/2020), 36 mg (08/01/2020), 8 mg (08/18/2020), 8 mg (08/19/2020), 18 mg (08/20/2020), 18 mg (08/21/2020), 8 mg (08/22/2020), 8 mg (09/08/2020), 8 mg (09/09/2020), 8 mg (09/10/2020), 8 mg (09/11/2020), 8 mg (09/12/2020), 18 mg (07/30/2020), 18 mg (07/31/2020) cyclophosphamide (CYTOXAN) 1,440 mg in sodium chloride 0.9 % 250 mL chemo infusion, 750 mg/m2 = 1,440 mg, Intravenous,  Once, 6 of 6 cycles Dose modification: 900 mg/m2 (original dose 750 mg/m2, Cycle 4, Reason: Provider Judgment), 1,080 mg/m2 (original dose 750 mg/m2, Cycle 5, Reason: Provider Judgment), 900 mg/m2 (original dose 750 mg/m2, Cycle 6, Reason: Provider Judgment) Administration: 1,440 mg (05/31/2020), 1,440 mg (06/20/2020), 1,440 mg (07/11/2020), 1,740 mg (  08/01/2020), 2,000 mg (08/22/2020), 1,740 mg (09/12/2020) riTUXimab-pvvr  (RUXIENCE) 700 mg in sodium chloride 0.9 % 250 mL (2.1875 mg/mL) infusion, 375 mg/m2 = 700 mg (100 % of original dose 375 mg/m2), Intravenous,  Once, 1 of 1 cycle Dose modification: 375 mg/m2 (original dose 375 mg/m2, Cycle 2) Administration: 700 mg (06/23/2020) riTUXimab-abbs (TRUXIMA) 700 mg in sodium chloride 0.9 % 250 mL (2.1875 mg/mL) infusion, 375 mg/m2 = 700 mg (100 % of original dose 375 mg/m2), Intravenous,  Once, 1 of 1 cycle Dose modification: 375 mg/m2 (original dose 375 mg/m2, Cycle 1) Administration: 700 mg (06/02/2020)  for chemotherapy treatment.       REVIEW OF SYSTEMS:   Constitutional: Denies fevers, chills.  Reports generalized weakness and a weight loss of about 20 pounds recently. Eyes: Denies blurriness of vision Ears, nose, mouth, throat, and face: Denies mucositis or sore throat Respiratory: Denies cough, dyspnea or wheezes Cardiovascular: Denies palpitation, chest discomfort Gastrointestinal:  Denies nausea, heartburn or change in bowel habits Skin: Denies abnormal skin rashes Lymphatics: Denies new lymphadenopathy or easy bruising Neurological:Denies numbness, tingling or new weaknesses Behavioral/Psych: Mood is stable, no new changes  Extremities: No lower extremity edema All other systems were reviewed with the patient and are negative.  I have reviewed the past medical history, past surgical history, social history and family history with the patient and they are unchanged from previous note.   PHYSICAL EXAMINATION: ECOG PERFORMANCE STATUS: 2 - Symptomatic, <50% confined to bed  Vitals:   10/20/20 2124 10/21/20 0616  BP: 121/75 116/69  Pulse: (!) 59 (!) 57  Resp: 16 16  Temp: 98.2 F (36.8 C) 97.8 F (36.6 C)  SpO2: 96% 96%   Filed Weights   10/15/20 1527  Weight: 72.9 kg    Intake/Output from previous day: 01/31 0701 - 02/01 0700 In: 1440.2 [P.O.:120; I.V.:1320.2] Out: 1800 [Urine:1800]  GENERAL:alert, no distress and comfortable SKIN:  skin color, texture, turgor are normal, no rashes or significant lesions EYES: normal, Conjunctiva are pink and non-injected, sclera clear OROPHARYNX:no exudate, no erythema and lips, buccal mucosa, and tongue normal  LYMPH:  no palpable lymphadenopathy in the cervical, axillary or inguinal LUNGS: clear to auscultation and percussion with normal breathing effort HEART: regular rate & rhythm and no murmurs and no lower extremity edema ABDOMEN:abdomen soft, non-tender and normal bowel sounds NEURO: alert & oriented x 3 with fluent speech, no focal motor/sensory deficits  LABORATORY DATA:  I have reviewed the data as listed CMP Latest Ref Rng & Units 10/21/2020 10/20/2020 10/19/2020  Glucose 70 - 99 mg/dL 187(H) 97 98  BUN 8 - 23 mg/dL 41(H) 29(H) 33(H)  Creatinine 0.61 - 1.24 mg/dL 1.72(H) 1.73(H) 1.66(H)  Sodium 135 - 145 mmol/L 142 139 140  Potassium 3.5 - 5.1 mmol/L 3.4(L) 3.5 3.2(L)  Chloride 98 - 111 mmol/L 107 105 106  CO2 22 - 32 mmol/L 24 25 26   Calcium 8.9 - 10.3 mg/dL 11.6(H) 11.6(H) 11.8(H)  Total Protein 6.5 - 8.1 g/dL 6.2(L) 5.8(L) 5.2(L)  Total Bilirubin 0.3 - 1.2 mg/dL 0.5 0.5 0.2(L)  Alkaline Phos 38 - 126 U/L 48 47 41  AST 15 - 41 U/L 24 25 27   ALT 0 - 44 U/L 22 20 20     Lab Results  Component Value Date   WBC 10.7 (H) 10/20/2020   HGB 8.8 (L) 10/20/2020   HCT 28.6 (L) 10/20/2020   MCV 90.5 10/20/2020   PLT 292 10/20/2020   NEUTROABS 8.0 (H) 10/15/2020  DG Chest 1 View  Result Date: 10/17/2020 CLINICAL DATA:  B-cell lymphoma.  Shortness of breath EXAM: CHEST  1 VIEW COMPARISON:  01/05/2014 FINDINGS: Right Port-A-Cath in place with the tip at the cavoatrial junction. Heart and mediastinal contours are within normal limits. No focal opacities or effusions. No acute bony abnormality. Old healed left rib fractures. IMPRESSION: No active cardiopulmonary disease. Electronically Signed   By: Rolm Baptise M.D.   On: 10/17/2020 15:08   US RENAL  Result Date:  10/17/2020 CLINICAL DATA:  Elevated creatinine EXAM: RENAL / URINARY TRACT ULTRASOUND COMPLETE COMPARISON:  CT abdomen and pelvis April 17, 2020. FINDINGS: Right Kidney: Renal measurements: 12.8 x 7.0 x 4.5 cm = volume: 213 mL. Echogenicity within normal limits. 1 cm interpolar renal cyst. No solid mass or hydronephrosis visualized. Left Kidney: Renal measurements: 12.7 x 6.5 x 6.4 cm = volume: 279 mL. Echogenicity within normal limits. 1.5 cm lower pole renal cyst. No solid mass or hydronephrosis visualized. Bladder: Appears normal for degree of bladder distention. Bilateral ureteral jets visualized. Other: None. IMPRESSION: 1. No hydronephrosis. 2. Small bilateral renal cysts. Electronically Signed   By: Dahlia Bailiff MD   On: 10/17/2020 08:21   NM PET Image Restag (PS) Skull Base To Thigh  Result Date: 10/06/2020 CLINICAL DATA:  Subsequent treatment strategy for diffuse B-cell lymphoma. EXAM: NUCLEAR MEDICINE PET SKULL BASE TO THIGH TECHNIQUE: 8.4 mCi F-18 FDG was injected intravenously. Full-ring PET imaging was performed from the skull base to thigh after the radiotracer. CT data was obtained and used for attenuation correction and anatomic localization. Fasting blood glucose: 114 mg/dl COMPARISON:  PET-CT 08/15/2020 and 04/28/2020. FINDINGS: Mediastinal blood pool activity: SUV max 1.9 Liver activity: SUV max 3.2 NECK: No hypermetabolic cervical lymph nodes are identified.Asymmetrically increased metabolic activity along the right aspect of the vocal cord, similar to the most recent previous study (SUV max 7.7). This could be physiologic or relate to contralateral vocal cord paralysis. No other lesions of the pharyngeal mucosal space. Incidental CT findings: none CHEST: Mildly increased metabolic activity medially in the AP window, corresponding with a 1.1 cm short axis lymph node on image 74/4 (SUV max 4.4). No other hypermetabolic mediastinal, hilar or axillary lymph nodes. Interval improvement in the  multifocal pulmonary nodularity which had progressed on the most recent study. There is associated mild hypermetabolic activity which has generally improved. Representative lesions include a 6 mm left upper lobe nodule on image 23/8 (SUV max 2.2), previously 8 mm and SUV max 4.9; 7 mm medial left upper lobe nodule on image 18/8 (SUV max 3.6), previously 9 mm and SUV max 4.8; and 7 mm right middle lobe nodule on image 42/8 (SUV max 1.4), previously 10 mm an SUV max 7.1. The patchy nodular airspace opacities in the left lower lobe have improved, likely inflammatory. No new or enlarging nodules. Incidental CT findings: Right IJ Port-A-Cath extends to the mid right atrium. Mild atherosclerosis of the aorta, great vessels and coronary arteries. Stable small pericardial effusion. No significant pleural fluid. ABDOMEN/PELVIS: There is no hypermetabolic activity within the liver, adrenal glands, spleen or pancreas. The spleen is normal in size without hypermetabolic activity (SUV max 3.2). The dominant mesenteric mass has decreased in size, measuring approximately 5.8 x 3.8 cm on image 135/4 (previously 7.1 x 3.8 cm). This demonstrates metabolic activity similar to background bowel activity (SUV max 4.3). There is a persistent hypermetabolic left mesenteric node measuring 1.9 x 0.9 cm on image 142/4 (SUV max 5.5). This  previously was less well-defined and smaller, measuring 1.7 x 0.8 cm with an SUV max of 3.8. No other hypermetabolic lymph nodes are seen. Incidental CT findings: Multiple nonobstructing renal calculi bilaterally. Calcified granulomas in the spleen and mild aortic atherosclerosis. SKELETON: There is no hypermetabolic activity to suggest osseous metastatic disease. The diffusely increased marrow uptake on the prior study has resolved, attributed to treatment related changes. 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 Healing fractures of the left 7th  and 8th ribs posteriorly. IMPRESSION: 1. Further decreased size of dominant mesenteric mass with metabolic activity similar to background bowel (Deauville 3/4). 2. Increased size and increased hypermetabolic activity within a single left anterior mesenteric lymph node (Deauville 4/5). 3. Interval improvement in the size and metabolic activity of multiple previously demonstrated pulmonary nodules. It is uncertain as to whether these relate to the patient's lymphoma or superimposed inflammation/infection. 4. Single low level hypermetabolic activity within an AP window lymph node, probably reactive. 5. 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 Correlate clinically. Electronically Signed   By: Richardean Sale M.D.   On: 10/06/2020 10:05   ECHOCARDIOGRAM COMPLETE  Result Date: 10/17/2020    ECHOCARDIOGRAM REPORT   Patient Name:   Connor Adams [facility] Date of Exam: 10/17/2020 Medical Rec #:  [id number]        Height:       68.0 in Accession #:    [id number]       Weight:       160.8 lb Date of Birth:  August 09, 1946        BSA:          1.863 m Patient Age:    75 years         BP:           149/68 mmHg Patient Gender: M                HR:           61 bpm. Exam Location:  Inpatient Procedure: 2D Echo Indications:    Abnormal ECG R94.31  History:        Patient has prior history of Echocardiogram examinations, most                 recent 09/06/2020. Risk Factors:Hypertension, Dyslipidemia and                 Non-Smoker.  Sonographer:    Leavy Cella Referring Phys: [id number] [place]  1. Left ventricular ejection fraction, by estimation, is 55 to 60%. The left ventricle has normal function. The left ventricle has no regional wall motion abnormalities. Left ventricular diastolic parameters were normal.  2. Right ventricular systolic function is normal. The right ventricular size is normal.  3. Left atrial size was mildly dilated.  4. The  mitral valve is grossly normal. Trivial mitral valve regurgitation.  5. The aortic valve is tricuspid. Aortic valve regurgitation is not visualized.  6. The inferior vena cava is normal in size with greater than 50% respiratory variability, suggesting right atrial pressure of 3 mmHg. Comparison(s): Changes from prior study are noted. 09/06/2020: LVEF 60-65%, grade 1 DD. FINDINGS  Left Ventricle: Left ventricular ejection fraction, by estimation, is 55 to 60%. The left ventricle has normal function. The left ventricle has no regional wall motion abnormalities. The left ventricular internal cavity size was normal in size. There is  no left ventricular hypertrophy. Left ventricular diastolic parameters were normal. Right Ventricle: The right ventricular size is normal. No increase in right ventricular wall thickness. Right ventricular systolic function is normal. Left Atrium: Left atrial size was mildly dilated. Right Atrium: Right atrial size was normal in size. Pericardium: There is no evidence of pericardial effusion. Mitral Valve: The mitral valve is grossly normal. Trivial mitral valve regurgitation. Tricuspid Valve: The tricuspid valve is grossly normal. Tricuspid valve regurgitation is trivial. Aortic Valve: The aortic valve is tricuspid. Aortic valve regurgitation is not visualized. Pulmonic Valve: The pulmonic valve was normal in structure. Pulmonic valve regurgitation is not visualized. Aorta: The aortic root and ascending aorta are structurally normal, with no evidence of dilitation. Venous: The inferior vena cava is normal in size with greater than 50% respiratory variability, suggesting right atrial pressure of 3 mmHg. IAS/Shunts: No atrial level shunt detected by color flow Doppler.  LEFT VENTRICLE PLAX 2D LVIDd:         5.40 cm  Diastology LVIDs:         3.80 cm  LV e' medial:    9.57 cm/s LV PW:         1.20 cm  LV E/e' medial:  6.9 LV IVS:        0.90 cm  LV e' lateral:   13.30 cm/s LVOT diam:     2.00  cm  LV E/e' lateral: 5.0 LVOT Area:     3.14 cm  RIGHT VENTRICLE RV S prime:     10.80 cm/s TAPSE (M-mode): 2.2 cm LEFT ATRIUM             Index       RIGHT ATRIUM           Index LA diam:        3.80 cm 2.04 cm/m  RA Area:     13.10 cm LA Vol (A2C):   77.9 ml 41.82 ml/m RA Volume:   29.60 ml  15.89 ml/m LA Vol (A4C):   60.0 ml 32.21 ml/m LA Biplane Vol: 69.8 ml 37.47 ml/m   AORTA Ao Root diam: 2.70 cm MITRAL VALVE MV Area (PHT): 1.87 cm    SHUNTS MV Decel Time: 405 msec    Systemic Diam: 2.00 cm MV E velocity: 66.00 cm/s MV A velocity: 54.80 cm/s MV E/A ratio:  1.20 Lyman Bishop MD Electronically signed by Lyman Bishop MD Signature Date/Time: 10/17/2020/4:19:47 PM    Final     ASSESSMENT AND PLAN: Connor Adams 75 y.o. male with medical history significant for Stage I DLBCL who presents for a follow up visit.  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  At this time he has completed all planned chemotherapy treatment (R-EPOCH x 6 cycles).   In our office last week, labs showed hypercalcemia, elevation in creatinine, and leukocytosis. Fortunately he tested negative for Covid.    He was admitted for further evaluation of this issue.  At this time I do not believe it is related to his cancer as his PET scan from 10/06/2020 shows an excellent response to treatment.  IPI Score: 2 points, good prognosis/ low-intermediate risk group (81% OS, 80% PFS)  #Hypercalcemia #Acute Kidney Injury #Weakness --Labs in our office 10/15/2020 show a calcium 14.8, creatinine of 2.08.  Hemoglobin is at 9.2  and white blood cell count is elevated at 11.1. --patient is COVID negative, PET CT scan from last week shows good response to chemotherapy with no strong evidence of active disease.  --Etiology of the findings are  concerning for either adrenal insufficiency or renal dysfunction.  Patient has been eating and drinking normally but has had a weight loss of 20 pounds recently and his energy levels have been low. --The patient is currently admitted to the hospital for work-up and treatment of his hypercalcemia and AKI --Cortisol level normal. --Work-up from this hospitalization has been reviewed including a low PTH at 9, elevated vitamin D, 1, 25-dihydroxy at 197, PTH related peptide less than 2,, vitamin D, 25-hydroxy normal at 42.11.  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 --The patient has received IV fluids this admission has also received a dose of Zometa 4 mg IV on 10/18/2020. --Repeat calcium pending this morning but is now down to 11.6 as of yesterday. --Renal function also improved overall but not yet back to baseline. --He was started on prednisone 80 mg on 10/20/2020 for possible granulomatous disease.  Hypercalcemia secondary to malignancy thought to be less likely due to good response on recent PET scan. Recommend prednisone 40 mg daily today and to continue upon discharge.  Will adjust dose as an outpatient. --Advised the patient to avoid increased intake of dairy products --Outpatient follow-up has been scheduled for 10/30/2020.  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  --findings consistent with a double hit DLBCL Treatment of choice is R-EPOCH chemotherapy in the inpatient setting. --he is currently s/p Cycle 6 administered on 09/08/2020 --patient has a port in place. TTE was performed and showed strong baseline heart function. --repeat evaluation PET CT scan shows good response to chemotherapy.  --Follow-up 10/30/2020.  #Lung Nodules, resolved -- noted on interim PET CT scan after Cycle 4.  --unclear if these represent areas of infection, metastatic disease, or progression of lymphoma --pulmonology consulted, appreciate their input.  --will  monitor respiratory status closely --repeat PET CT in Jan 2022 showed resolution of the lesions  #Symptom Management --Continue MiraLAX and Senokot-S for constipation.    LOS: 6 days   [name], DNP, AGPCNP-BC, AOCNP 10/21/20

## 2020-10-23 ENCOUNTER — Ambulatory Visit: Payer: Medicare PPO | Admitting: Hematology and Oncology

## 2020-10-23 ENCOUNTER — Other Ambulatory Visit: Payer: Medicare PPO

## 2020-10-24 ENCOUNTER — Other Ambulatory Visit: Payer: Self-pay | Admitting: *Deleted

## 2020-10-24 ENCOUNTER — Telehealth: Payer: Self-pay | Admitting: *Deleted

## 2020-10-24 MED ORDER — BISACODYL 10 MG RE SUPP: 10.0000 mg | RECTAL | 0 refills | Status: DC | PRN

## 2020-10-24 NOTE — Telephone Encounter (Signed)
Received call from pt's wife, Connor Adams. She states that Connor Adams is quite constipated. No BM since Tuesday. He is taking Miralax occasionally, Senna S 2tabs in the morning and 2 tabs at night. without relief.  Advised to increas fluid intake and to increase Senna sto 3 at night and 2 in the morning. Also advised to pick up Magnesium Citrate @ their drugstore. Advised to drink 1/2 bottle once he is home from the store and if no results in 4 hours, drink the other half. Also advised that I would send in prescription for Dulcolax suppositories (pt stated that this helped when he was in the hospital) Asked pt to call back if the above does not help.

## 2020-10-24 NOTE — Progress Notes (Signed)
ul ?

## 2020-10-29 ENCOUNTER — Other Ambulatory Visit: Payer: Self-pay | Admitting: Hematology and Oncology

## 2020-10-29 DIAGNOSIS — C8333 Diffuse large B-cell lymphoma, intra-abdominal lymph nodes: Secondary | ICD-10-CM

## 2020-10-30 ENCOUNTER — Inpatient Hospital Stay: Payer: Medicare PPO | Admitting: Hematology and Oncology

## 2020-10-30 ENCOUNTER — Inpatient Hospital Stay: Payer: Medicare PPO

## 2020-10-30 ENCOUNTER — Inpatient Hospital Stay: Payer: Medicare PPO | Attending: Hematology and Oncology

## 2020-10-30 ENCOUNTER — Other Ambulatory Visit: Payer: Self-pay

## 2020-10-30 VITALS — BP 165/78 | HR 61 | Temp 97.5°F | Resp 18 | Ht 68.0 in | Wt 158.8 lb

## 2020-10-30 DIAGNOSIS — Z836 Family history of other diseases of the respiratory system: Secondary | ICD-10-CM | POA: Diagnosis not present

## 2020-10-30 DIAGNOSIS — Z833 Family history of diabetes mellitus: Secondary | ICD-10-CM | POA: Insufficient documentation

## 2020-10-30 DIAGNOSIS — C8333 Diffuse large B-cell lymphoma, intra-abdominal lymph nodes: Secondary | ICD-10-CM | POA: Diagnosis not present

## 2020-10-30 DIAGNOSIS — Z79899 Other long term (current) drug therapy: Secondary | ICD-10-CM | POA: Diagnosis not present

## 2020-10-30 DIAGNOSIS — E860 Dehydration: Secondary | ICD-10-CM

## 2020-10-30 DIAGNOSIS — Z95828 Presence of other vascular implants and grafts: Secondary | ICD-10-CM | POA: Diagnosis not present

## 2020-10-30 DIAGNOSIS — R0602 Shortness of breath: Secondary | ICD-10-CM | POA: Diagnosis not present

## 2020-10-30 DIAGNOSIS — Z8042 Family history of malignant neoplasm of prostate: Secondary | ICD-10-CM | POA: Diagnosis not present

## 2020-10-30 DIAGNOSIS — C8338 Diffuse large B-cell lymphoma, lymph nodes of multiple sites: Secondary | ICD-10-CM | POA: Diagnosis not present

## 2020-10-30 DIAGNOSIS — Z8049 Family history of malignant neoplasm of other genital organs: Secondary | ICD-10-CM | POA: Insufficient documentation

## 2020-10-30 DIAGNOSIS — Z8249 Family history of ischemic heart disease and other diseases of the circulatory system: Secondary | ICD-10-CM | POA: Diagnosis not present

## 2020-10-30 DIAGNOSIS — Z800 Family history of malignant neoplasm of digestive organs: Secondary | ICD-10-CM | POA: Diagnosis not present

## 2020-10-30 LAB — CBC WITH DIFFERENTIAL (CANCER CENTER ONLY)
Abs Immature Granulocytes: 0.01 10*3/uL (ref 0.00–0.07)
Basophils Absolute: 0 10*3/uL (ref 0.0–0.1)
Basophils Relative: 1 %
Eosinophils Absolute: 0.1 10*3/uL (ref 0.0–0.5)
Eosinophils Relative: 1 %
HCT: 29.3 % — ABNORMAL LOW (ref 39.0–52.0)
Hemoglobin: 9.1 g/dL — ABNORMAL LOW (ref 13.0–17.0)
Immature Granulocytes: 0 %
Lymphocytes Relative: 15 %
Lymphs Abs: 1 10*3/uL (ref 0.7–4.0)
MCH: 27.5 pg (ref 26.0–34.0)
MCHC: 31.1 g/dL (ref 30.0–36.0)
MCV: 88.5 fL (ref 80.0–100.0)
Monocytes Absolute: 0.7 10*3/uL (ref 0.1–1.0)
Monocytes Relative: 11 %
Neutro Abs: 4.7 10*3/uL (ref 1.7–7.7)
Neutrophils Relative %: 72 %
Platelet Count: 262 10*3/uL (ref 150–400)
RBC: 3.31 MIL/uL — ABNORMAL LOW (ref 4.22–5.81)
RDW: 18.9 % — ABNORMAL HIGH (ref 11.5–15.5)
WBC Count: 6.6 10*3/uL (ref 4.0–10.5)
nRBC: 0 % (ref 0.0–0.2)

## 2020-10-30 LAB — CMP (CANCER CENTER ONLY)
ALT: 48 U/L — ABNORMAL HIGH (ref 0–44)
AST: 34 U/L (ref 15–41)
Albumin: 3.7 g/dL (ref 3.5–5.0)
Alkaline Phosphatase: 49 U/L (ref 38–126)
Anion gap: 8 (ref 5–15)
BUN: 45 mg/dL — ABNORMAL HIGH (ref 8–23)
CO2: 25 mmol/L (ref 22–32)
Calcium: 9.4 mg/dL (ref 8.9–10.3)
Chloride: 110 mmol/L (ref 98–111)
Creatinine: 1.23 mg/dL (ref 0.61–1.24)
GFR, Estimated: >60 mL/min (ref >60)
Glucose, Bld: 109 mg/dL — ABNORMAL HIGH (ref 70–99)
Potassium: 3.3 mmol/L — ABNORMAL LOW (ref 3.5–5.1)
Sodium: 143 mmol/L (ref 135–145)
Total Bilirubin: 0.3 mg/dL (ref 0.3–1.2)
Total Protein: 5.9 g/dL — ABNORMAL LOW (ref 6.5–8.1)

## 2020-10-30 LAB — TSH: TSH: 1.455 u[IU]/mL (ref 0.320–4.118)

## 2020-10-30 LAB — LACTATE DEHYDROGENASE: LDH: 198 U/L — ABNORMAL HIGH (ref 98–192)

## 2020-10-30 MED ORDER — HEPARIN SOD (PORK) LOCK FLUSH 100 UNIT/ML IV SOLN
500.0000 [IU] | Freq: Once | INTRAVENOUS | Status: AC
  Administered 2020-10-30: 500 [IU] via INTRAVENOUS
  Filled 2020-10-30: qty 5

## 2020-10-30 MED ORDER — SODIUM CHLORIDE 0.9% FLUSH
10.0000 mL | INTRAVENOUS | Status: DC | PRN
  Administered 2020-10-30: 10 mL via INTRAVENOUS
  Filled 2020-10-30: qty 10

## 2020-10-30 NOTE — Progress Notes (Signed)
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   1) 04/17/2020:  CT A/P showed dominant nodal mass (11.5 x 10.9 cm) within the jejunal mesentery, highly suspicious for lymphoma. Additionally there are left lower lobe pleural-based pulmonary nodules 2) 04/24/2020: establish care with Dr. Lorenso Courier 3) 04/28/2020:  PET CT scan shows Large solid left mesenteric mass 13.0 cm with maximum SUV of 21.2, Deauville 5 with local hypermetabolic porta hepatis and retroperitoneal lymph nodes. Constitutes bulky Stage I disease.  4) 05/27/2020-05/31/2020: Cycle 1 of R-EPOCH 5) 06/16/2020-06/23/2020: Cycle 2 of R-EPOCH 6) 07/07/2020-07/11/2020: Cycle 3 of R-EPOCH  7) 07/28/2020-08/01/2020: Cycle 4 of R-EPOCH 8) 08/18/2020-08/22/2020: Cycle 5 of R-EPOCH 9) 09/08/2020-09/12/2020: Cycle 6 of R-EPOCH 10) 10/06/2020: PET CT scan shows decreased size of dominant mesenteric mass, however there was hypermetabolic activity within a single left anterior mesenteric lymph node   Interval History:  ALIAS VILLAGRAN 75 y.o. male with medical history significant for Stage I DLBCL who presents for a follow up visit. The patient was last seen on 10/16/2020 . At that time he was unfortunately admitted for marked hypercalcemia (>15 corrected). He was subsequently treated with Zometa therapy and hydration with normalization of his calcium. He presents today for follow-up.  On exam today Mr. Dini is accompanied by his wife by phone. He reports he is having some continual numbness in his fingers, but otherwise is quite well. He reports that he is feeling great and his appetite is good. He is up 1 pound from his last visit. He has cut back on milk but is still drinking two protein shakes per day. He is doing his best to try to  get in shape and is exercising again. He says that today he feels "more like [place] Side". He is not having any further muscle cramping which was of concern to him when his calcium was quite high. He notes that his bowels are now moving with regularity. He currently denies having issues with fevers, chills, sweats, nausea, vomiting or diarrhea.  A full 10 point ROS is listed below.  MEDICAL HISTORY:  Past Medical History:  Diagnosis Date  . Arthritis   . Chronic kidney disease (CKD), stage III (moderate) (HCC)   . COVID-19   . GERD (gastroesophageal reflux disease)    occ  . Hemorrhoids   . History of ETT 3/05   low risk  . History of hiatal hernia   . History of kidney stones   . HLD (hyperlipidemia)   . HTN (hypertension)   . Left inguinal hernia   . Melanoma ([place])    excied  . 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 Stroke [facility]) 2011   tia   . TIA (transient ischemic attack) 4/11   associated w slurred speecha ndmild facial droop. lasted for about 10 minutes. Had MRI, etc with [facility] that per his report was ok (dr. Stephannie Li). Echo (4/11): EF 55-60%, no regional WMAs, normal diastolic function, mild LAE, no source of embolus    SURGICAL HISTORY: Past Surgical History:  Procedure Laterality Date  . BMI 30.2 muscular  12/08/04  . curretage actinic keratosis R ear  01/20/05  . ETT low prob of CAD  11/21/03  . excision melanoma in situ back  01/20/05  . hemorrhoids sclerosed at colonoscopy  12/19/05  .  IR IMAGING GUIDED PORT INSERTION  05/19/2020  . LAPAROSCOPIC INGUINAL HERNIA REPAIR Left 12/20/1995  . LUMBAR DISC SURGERY    . NERVE REPAIR     back of neck  . retinal laser surgery Left 09/20/1986  . SEPTOPLASTY  11/19/98  . TOTAL KNEE ARTHROPLASTY Left 12/13/2016   Procedure: TOTAL KNEE ARTHROPLASTY WITH RIGHT KNEE CORTISONE INJECTION;  Surgeon: Frederik Pear, MD;  Location: [facility];  Service: Orthopedics;  Laterality: Left;  . uric acid 6.9  12/22/04  . x-ray l great toe  07/21/00     SOCIAL HISTORY: Social History   Socioeconomic History  . Marital status: Married    Spouse name: [name]  . Number of children: 2  . Years of education: 12  . Highest education level: Not on file  Occupational History  . Occupation: Systems developer: [employer]  Tobacco Use  . Smoking status: Never Smoker  . Smokeless tobacco: Never Used  Vaping Use  . Vaping Use: Never used  Substance and Sexual Activity  . Alcohol use: No  . Drug use: No  . Sexual activity: Yes  Other Topics Concern  . Not on file  Social History Narrative   Married to [employer] at [employer].   Son, [name] married   Son, [name], Married.    Caffeine use: 1 cup coffee per day   Social Determinants of Health   Financial Resource Strain: Not on file  Food Insecurity: Not on file  Transportation Needs: Not on file  Physical Activity: Not on file  Stress: Not on file  Social Connections: Not on file  Intimate Partner Violence: Not on file    FAMILY HISTORY: Family History  Problem Relation Age of Onset  . Liver cancer Sister   . Heart failure Brother 44       CABGx4  . Heart attack Mother 57  . Uterine cancer Mother   . Heart attack Father 50  . Asthma Son   . Diabetes Brother   . Prostate cancer Brother     ALLERGIES:  has No Known Allergies.  MEDICATIONS:  Current Outpatient Medications  Medication Sig Dispense Refill  . acetaminophen (TYLENOL) 325 MG tablet Take 2 tablets (650 mg total) by mouth every 6 (six) hours as needed for mild pain, moderate pain or fever.    Marland Kitchen amLODipine (NORVASC) 2.5 MG tablet Take 1 tablet (2.5 mg total) by mouth daily. 30 tablet 2  . aspirin EC 81 MG tablet Take 81 mg by mouth daily. Swallow whole.    . bisacodyl (DULCOLAX) 10 MG suppository Place 1 suppository (10 mg total) rectally as needed for moderate constipation. 12 suppository 0  . feeding supplement, ENSURE ENLIVE, (ENSURE ENLIVE) LIQD Take 237 mLs by mouth 2  (two) times daily between meals. 237 mL 12  . fluticasone (FLONASE) 50 MCG/ACT nasal spray Place 1-2 sprays into both nostrils daily as needed for allergies or rhinitis.  2  . hydrocortisone (ANUSOL-HC) 2.5 % rectal cream Place 1 application rectally daily as needed. (Patient taking differently: Place 1 application rectally daily as needed for hemorrhoids or anal itching.) 30 g 0  . Multiple Vitamin (MULTI VITAMIN) TABS Take 1 tablet by mouth daily.    . mupirocin cream (BACTROBAN) 2 % Apply 1 application topically daily.    . polyethylene glycol (MIRALAX / GLYCOLAX) 17 g packet Take 17 g by mouth daily as needed for moderate constipation. 14 each 0  .  polyvinyl alcohol (LIQUIFILM TEARS) 1.4 % ophthalmic solution Place 1 drop into both eyes daily as needed for dry eyes.    . predniSONE (DELTASONE) 20 MG tablet Take 2 tablets (40 mg total) by mouth daily with breakfast. 20 tablet 0  . rosuvastatin (CRESTOR) 5 MG tablet Take 5 mg by mouth daily.    Marland Kitchen senna-docusate (SENOKOT-S) 8.6-50 MG tablet Take 2 tablets by mouth 2 (two) times daily as needed for mild constipation.     No current facility-administered medications for this visit.    REVIEW OF SYSTEMS:   Constitutional: ( - ) fevers, ( - )  chills , ( - ) night sweats Eyes: ( - ) blurriness of vision, ( - ) double vision, ( - ) watery eyes Ears, nose, mouth, throat, and face: ( - ) mucositis, ( - ) sore throat Respiratory: ( - ) cough, ( - ) dyspnea, ( - ) wheezes Cardiovascular: ( - ) palpitation, ( - ) chest discomfort, ( - ) lower extremity swelling Gastrointestinal:  ( - ) nausea, ( - ) heartburn, ( - ) change in bowel habits Skin: ( - ) abnormal skin rashes Lymphatics: ( - ) new lymphadenopathy, ( - ) easy bruising Neurological: ( - ) numbness, ( - ) tingling, ( - ) new weaknesses Behavioral/Psych: ( - ) mood change, ( - ) new changes  All other systems were reviewed with the patient and are negative.  PHYSICAL EXAMINATION: ECOG  PERFORMANCE STATUS: 1 - Symptomatic but completely ambulatory  Vitals:   10/30/20 0830  BP: (!) 165/78  Pulse: 61  Resp: 18  Temp: (!) 97.5 F (36.4 C)  SpO2: 99%   Filed Weights   10/30/20 0830  Weight: 158 lb 12.8 oz (72 kg)    GENERAL: well appearing elderly Caucasian male. alert, no distress and comfortable SKIN: skin color, texture, turgor are normal, no rashes or significant lesions. Bald, prior erythematous skin lesions clearing up.  EYES: conjunctiva are pink and non-injected, sclera clear LUNGS: clear to auscultation and percussion with normal breathing effort HEART: regular rate & rhythm and no murmurs and no lower extremity edema ABDOMEN: soft, nontender. No evidence of previously noted mass.  Musculoskeletal: no cyanosis of digits and no clubbing  PSYCH: alert & oriented x 3, fluent speech NEURO: no focal motor/sensory deficits  LABORATORY DATA:  I have reviewed the data as listed CBC Latest Ref Rng & Units 10/30/2020 10/20/2020 10/15/2020  WBC 4.0 - 10.5 K/uL 6.6 10.7(H) 11.7(H)  Hemoglobin 13.0 - 17.0 g/dL 9.1(L) 8.8(L) 9.2(L)  Hematocrit 39.0 - 52.0 % 29.3(L) 28.6(L) 30.2(L)  Platelets 150 - 400 K/uL 262 292 319    CMP Latest Ref Rng & Units 10/30/2020 10/21/2020 10/20/2020  Glucose 70 - 99 mg/dL 109(H) 187(H) 97  BUN 8 - 23 mg/dL 45(H) 41(H) 29(H)  Creatinine 0.61 - 1.24 mg/dL 1.23 1.72(H) 1.73(H)  Sodium 135 - 145 mmol/L 143 142 139  Potassium 3.5 - 5.1 mmol/L 3.3(L) 3.4(L) 3.5  Chloride 98 - 111 mmol/L 110 107 105  CO2 22 - 32 mmol/L 25 24 25   Calcium 8.9 - 10.3 mg/dL 9.4 11.6(H) 11.6(H)  Total Protein 6.5 - 8.1 g/dL 5.9(L) 6.2(L) 5.8(L)  Total Bilirubin 0.3 - 1.2 mg/dL 0.3 0.5 0.5  Alkaline Phos 38 - 126 U/L 49 48 47  AST 15 - 41 U/L 34 24 25  ALT 0 - 44 U/L 48(H) 22 20    RADIOGRAPHIC STUDIES: I have personally reviewed the radiological images as  listed and agreed with the findings in the report: large abdominal mass markedly decreased in FDG avidity.  Resolution of diffuse pulmonary nodules.   DG Chest 1 View  Result Date: 10/17/2020 CLINICAL DATA:  B-cell lymphoma.  Shortness of breath EXAM: CHEST  1 VIEW COMPARISON:  01/05/2014 FINDINGS: Right Port-A-Cath in place with the tip at the cavoatrial junction. Heart and mediastinal contours are within normal limits. No focal opacities or effusions. No acute bony abnormality. Old healed left rib fractures. IMPRESSION: No active cardiopulmonary disease. Electronically Signed   By: Rolm Baptise M.D.   On: 10/17/2020 15:08   US RENAL  Result Date: 10/17/2020 CLINICAL DATA:  Elevated creatinine EXAM: RENAL / URINARY TRACT ULTRASOUND COMPLETE COMPARISON:  CT abdomen and pelvis April 17, 2020. FINDINGS: Right Kidney: Renal measurements: 12.8 x 7.0 x 4.5 cm = volume: 213 mL. Echogenicity within normal limits. 1 cm interpolar renal cyst. No solid mass or hydronephrosis visualized. Left Kidney: Renal measurements: 12.7 x 6.5 x 6.4 cm = volume: 279 mL. Echogenicity within normal limits. 1.5 cm lower pole renal cyst. No solid mass or hydronephrosis visualized. Bladder: Appears normal for degree of bladder distention. Bilateral ureteral jets visualized. Other: None. IMPRESSION: 1. No hydronephrosis. 2. Small bilateral renal cysts. Electronically Signed   By: Dahlia Bailiff MD   On: 10/17/2020 08:21   NM PET Image Restag (PS) Skull Base To Thigh  Result Date: 10/06/2020 CLINICAL DATA:  Subsequent treatment strategy for diffuse B-cell lymphoma. EXAM: NUCLEAR MEDICINE PET SKULL BASE TO THIGH TECHNIQUE: 8.4 mCi F-18 FDG was injected intravenously. Full-ring PET imaging was performed from the skull base to thigh after the radiotracer. CT data was obtained and used for attenuation correction and anatomic localization. Fasting blood glucose: 114 mg/dl COMPARISON:  PET-CT 08/15/2020 and 04/28/2020. FINDINGS: Mediastinal blood pool activity: SUV max 1.9 Liver activity: SUV max 3.2 NECK: No hypermetabolic cervical lymph nodes  are identified.Asymmetrically increased metabolic activity along the right aspect of the vocal cord, similar to the most recent previous study (SUV max 7.7). This could be physiologic or relate to contralateral vocal cord paralysis. No other lesions of the pharyngeal mucosal space. Incidental CT findings: none CHEST: Mildly increased metabolic activity medially in the AP window, corresponding with a 1.1 cm short axis lymph node on image 74/4 (SUV max 4.4). No other hypermetabolic mediastinal, hilar or axillary lymph nodes. Interval improvement in the multifocal pulmonary nodularity which had progressed on the most recent study. There is associated mild hypermetabolic activity which has generally improved. Representative lesions include a 6 mm left upper lobe nodule on image 23/8 (SUV max 2.2), previously 8 mm and SUV max 4.9; 7 mm medial left upper lobe nodule on image 18/8 (SUV max 3.6), previously 9 mm and SUV max 4.8; and 7 mm right middle lobe nodule on image 42/8 (SUV max 1.4), previously 10 mm an SUV max 7.1. The patchy nodular airspace opacities in the left lower lobe have improved, likely inflammatory. No new or enlarging nodules. Incidental CT findings: Right IJ Port-A-Cath extends to the mid right atrium. Mild atherosclerosis of the aorta, great vessels and coronary arteries. Stable small pericardial effusion. No significant pleural fluid. ABDOMEN/PELVIS: There is no hypermetabolic activity within the liver, adrenal glands, spleen or pancreas. The spleen is normal in size without hypermetabolic activity (SUV max 3.2). The dominant mesenteric mass has decreased in size, measuring approximately 5.8 x 3.8 cm on image 135/4 (previously 7.1 x 3.8 cm). This demonstrates metabolic activity similar to background bowel  activity (SUV max 4.3). There is a persistent hypermetabolic left mesenteric node measuring 1.9 x 0.9 cm on image 142/4 (SUV max 5.5). This previously was less well-defined and smaller, measuring 1.7  x 0.8 cm with an SUV max of 3.8. No other hypermetabolic lymph nodes are seen. Incidental CT findings: Multiple nonobstructing renal calculi bilaterally. Calcified granulomas in the spleen and mild aortic atherosclerosis. SKELETON: There is no hypermetabolic activity to suggest osseous metastatic disease. The diffusely increased marrow uptake on the prior study has resolved, attributed to treatment related changes. 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 Healing fractures of the left 7th and 8th ribs posteriorly. IMPRESSION: 1. Further decreased size of dominant mesenteric mass with metabolic activity similar to background bowel (Deauville 3/4). 2. Increased size and increased hypermetabolic activity within a single left anterior mesenteric lymph node (Deauville 4/5). 3. Interval improvement in the size and metabolic activity of multiple previously demonstrated pulmonary nodules. It is uncertain as to whether these relate to the patient's lymphoma or superimposed inflammation/infection. 4. Single low level hypermetabolic activity within an AP window lymph node, probably reactive. 5. 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 Correlate clinically. Electronically Signed   By: Richardean Sale M.D.   On: 10/06/2020 10:05   ECHOCARDIOGRAM COMPLETE  Result Date: 10/17/2020    ECHOCARDIOGRAM REPORT   Patient Name:   LAVI SHEEHAN [facility] Date of Exam: 10/17/2020 Medical Rec #:  [id number]        Height:       68.0 in Accession #:    [id number]       Weight:       160.8 lb Date of Birth:  1946-02-23        BSA:          1.863 m Patient Age:    75 years         BP:           149/68 mmHg Patient Gender: M                HR:           61 bpm. Exam Location:  Inpatient Procedure: 2D Echo Indications:    Abnormal ECG R94.31  History:        Patient has prior history of Echocardiogram examinations, most                  recent 09/06/2020. Risk Factors:Hypertension, Dyslipidemia and                 Non-Smoker.  Sonographer:    Leavy Cella Referring Phys: [id number] [place]  1. Left ventricular ejection fraction, by estimation, is 55 to 60%. The left ventricle has normal function. The left ventricle has no regional wall motion abnormalities. Left ventricular diastolic parameters were normal.  2. Right ventricular systolic function is normal. The right ventricular size is normal.  3. Left atrial size was mildly dilated.  4. The mitral valve is grossly normal. Trivial mitral valve regurgitation.  5. The aortic valve is tricuspid. Aortic valve regurgitation is not visualized.  6. The inferior vena cava is normal in size with greater than 50% respiratory variability, suggesting right atrial pressure of 3 mmHg. Comparison(s): Changes from prior study are noted. 09/06/2020: LVEF 60-65%, grade 1 DD. FINDINGS  Left Ventricle: Left ventricular ejection fraction, by estimation, is 55 to 60%. The left ventricle has  normal function. The left ventricle has no regional wall motion abnormalities. The left ventricular internal cavity size was normal in size. There is  no left ventricular hypertrophy. Left ventricular diastolic parameters were normal. Right Ventricle: The right ventricular size is normal. No increase in right ventricular wall thickness. Right ventricular systolic function is normal. Left Atrium: Left atrial size was mildly dilated. Right Atrium: Right atrial size was normal in size. Pericardium: There is no evidence of pericardial effusion. Mitral Valve: The mitral valve is grossly normal. Trivial mitral valve regurgitation. Tricuspid Valve: The tricuspid valve is grossly normal. Tricuspid valve regurgitation is trivial. Aortic Valve: The aortic valve is tricuspid. Aortic valve regurgitation is not visualized. Pulmonic Valve: The pulmonic valve was normal in structure. Pulmonic valve  regurgitation is not visualized. Aorta: The aortic root and ascending aorta are structurally normal, with no evidence of dilitation. Venous: The inferior vena cava is normal in size with greater than 50% respiratory variability, suggesting right atrial pressure of 3 mmHg. IAS/Shunts: No atrial level shunt detected by color flow Doppler.  LEFT VENTRICLE PLAX 2D LVIDd:         5.40 cm  Diastology LVIDs:         3.80 cm  LV e' medial:    9.57 cm/s LV PW:         1.20 cm  LV E/e' medial:  6.9 LV IVS:        0.90 cm  LV e' lateral:   13.30 cm/s LVOT diam:     2.00 cm  LV E/e' lateral: 5.0 LVOT Area:     3.14 cm  RIGHT VENTRICLE RV S prime:     10.80 cm/s TAPSE (M-mode): 2.2 cm LEFT ATRIUM             Index       RIGHT ATRIUM           Index LA diam:        3.80 cm 2.04 cm/m  RA Area:     13.10 cm LA Vol (A2C):   77.9 ml 41.82 ml/m RA Volume:   29.60 ml  15.89 ml/m LA Vol (A4C):   60.0 ml 32.21 ml/m LA Biplane Vol: 69.8 ml 37.47 ml/m   AORTA Ao Root diam: 2.70 cm MITRAL VALVE MV Area (PHT): 1.87 cm    SHUNTS MV Decel Time: 405 msec    Systemic Diam: 2.00 cm MV E velocity: 66.00 cm/s MV A velocity: 54.80 cm/s MV E/A ratio:  1.20 Lyman Bishop MD Electronically signed by Lyman Bishop MD Signature Date/Time: 10/17/2020/4:19:47 PM    Final     ASSESSMENT & PLAN Ellwood Sayers 75 y.o. male with medical history significant for Stage I DLBCL who presents for a follow up visit.  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  At this time he has completed all planned chemotherapy treatment (R-EPOCH x 6 cycles).   On exam today Mr. Rockhill is back to his usual self.  His hypercalcemia, elevation in creatinine, and leukocytosis have resolved.  Fortunately he tested negative for Covid. The etiology of his hypercalcemia and  renal failure is not entirely clear. We will keep a close eye on his labs in the interim and consider repeat scan for progression of disease if these labs creep up again.  IPI Score:  2 points, good prognosis/ low-intermediate risk group (81% OS, 80% PFS)  #Hypercalcemia, resolved  #Acute Kidney Injury, resolved  --Labs today how a calcium 9.4, creatinine of 1.23.  Hemoglobin is at 9.1 and white blood cell count is 6.6 --patient is COVID negative, PET CT scan from last week shows good response to chemotherapy with no strong evidence of active disease.  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  --findings consistent with a double hit DLBCL Treatment of choice is R-EPOCH chemotherapy in the inpatient setting. --he is currently s/p Cycle 6 administered on 09/08/2020 --patient has a port in place. TTE was performed and showed strong baseline heart function. --repeat evaluation PET CT scan shows good response to chemotherapy.  --RTC in 4 weeks with interval 2 week labs  #Lung Nodules, resolved -- noted on interim PET CT scan after Cycle 4.  --unclear if these represent areas of infection, metastatic disease, or progression of lymphoma --pulmonology consulted, appreciate their input.  --will monitor respiratory status closely --repeat PET CT in Jan 2022 showed resolution of the lesions  #Oral Ulcers, resolved -- oral lesion on left side of tongue. During prior cycles was noted to be on roof of mouth and buccal membranes --patient used magic mouthwash --no currently present  #Symptom Management --discontinued allopurinol 300mg  PO daily  --prescribed ondansetron 8mg  PO q8H PRN and compazine 10mg  PO q6H prn for nausea --discontinue Protonix/sulcrafate as these were administered in the setting of high dose steroids --encourage daily BM, prescribed senna  docusate and miralax.  No orders of the defined types were placed in this encounter.  All questions were answered. The patient knows to call the clinic with any problems, questions or concerns.  A total of more than 30 minutes were spent on this encounter and over half of that time was spent on counseling and coordination of care as outlined above.   [name], MD Department of Hematology/Oncology [place] at [facility] Phone: [phone] Pager: [phone] Email: [email or web address]  11/01/2020 5:13 PM

## 2020-10-30 NOTE — Patient Instructions (Signed)
Implanted Port Insertion, Care After This sheet gives you information about how to care for yourself after your procedure. Your health care provider may also give you more specific instructions. If you have problems or questions, contact your health care provider. What can I expect after the procedure? After the procedure, it is common to have:  Discomfort at the port insertion site.  Bruising on the skin over the port. This should improve over 3-4 days. Follow these instructions at home: Port care  After your port is placed, you will get a manufacturer's information card. The card has information about your port. Keep this card with you at all times.  Take care of the port as told by your health care provider. Ask your health care provider if you or a family member can get training for taking care of the port at home. A home health care nurse may also take care of the port.  Make sure to remember what type of port you have. Incision care  Follow instructions from your health care provider about how to take care of your port insertion site. Make sure you: ? Wash your hands with soap and water before and after you change your bandage (dressing). If soap and water are not available, use hand sanitizer. ? Change your dressing as told by your health care provider. ? Leave stitches (sutures), skin glue, or adhesive strips in place. These skin closures may need to stay in place for 2 weeks or longer. If adhesive strip edges start to loosen and curl up, you may trim the loose edges. Do not remove adhesive strips completely unless your health care provider tells you to do that.  Check your port insertion site every day for signs of infection. Check for: ? Redness, swelling, or pain. ? Fluid or blood. ? Warmth. ? Pus or a bad smell.      Activity  Return to your normal activities as told by your health care provider. Ask your health care provider what activities are safe for you.  Do not  lift anything that is heavier than 10 lb (4.5 kg), or the limit that you are told, until your health care provider says that it is safe. General instructions  Take over-the-counter and prescription medicines only as told by your health care provider.  Do not take baths, swim, or use a hot tub until your health care provider approves. Ask your health care provider if you may take showers. You may only be allowed to take sponge baths.  Do not drive for 24 hours if you were given a sedative during your procedure.  Wear a medical alert bracelet in case of an emergency. This will tell any health care providers that you have a port.  Keep all follow-up visits as told by your health care provider. This is important. Contact a health care provider if:  You cannot flush your port with saline as directed, or you cannot draw blood from the port.  You have a fever or chills.  You have redness, swelling, or pain around your port insertion site.  You have fluid or blood coming from your port insertion site.  Your port insertion site feels warm to the touch.  You have pus or a bad smell coming from the port insertion site. Get help right away if:  You have chest pain or shortness of breath.  You have bleeding from your port that you cannot control. Summary  Take care of the port as told by your   health care provider. Keep the manufacturer's information card with you at all times.  Change your dressing as told by your health care provider.  Contact a health care provider if you have a fever or chills or if you have redness, swelling, or pain around your port insertion site.  Keep all follow-up visits as told by your health care provider. This information is not intended to replace advice given to you by your health care provider. Make sure you discuss any questions you have with your health care provider. Document Revised: 04/04/2018 Document Reviewed: 04/04/2018 Elsevier Patient Education   2021 Elsevier Inc.  

## 2020-11-01 MED ORDER — POTASSIUM CHLORIDE CRYS ER 20 MEQ PO TBCR: 20.0000 meq | EXTENDED_RELEASE_TABLET | Freq: Every day | ORAL | 0 refills | Status: DC

## 2020-11-03 ENCOUNTER — Telehealth: Payer: Self-pay | Admitting: Hematology and Oncology

## 2020-11-03 NOTE — Telephone Encounter (Signed)
Scheduled per 2/10 los. Called and spoke with pt, confirmed 2/24 and 3/10 appts

## 2020-11-04 DIAGNOSIS — N183 Chronic kidney disease, stage 3 unspecified: Secondary | ICD-10-CM | POA: Diagnosis not present

## 2020-11-04 DIAGNOSIS — Z8579 Personal history of other malignant neoplasms of lymphoid, hematopoietic and related tissues: Secondary | ICD-10-CM | POA: Diagnosis not present

## 2020-11-04 DIAGNOSIS — I10 Essential (primary) hypertension: Secondary | ICD-10-CM | POA: Diagnosis not present

## 2020-11-06 ENCOUNTER — Telehealth: Payer: Self-pay | Admitting: *Deleted

## 2020-11-06 ENCOUNTER — Other Ambulatory Visit: Payer: Self-pay | Admitting: *Deleted

## 2020-11-06 MED ORDER — PREDNISONE 20 MG PO TABS: 20.0000 mg | ORAL_TABLET | Freq: Every day | ORAL | 0 refills | Status: AC

## 2020-11-06 MED ORDER — PREDNISONE 10 MG PO TABS: 10.0000 mg | ORAL_TABLET | Freq: Every day | ORAL | 0 refills | Status: AC

## 2020-11-06 NOTE — Telephone Encounter (Signed)
Received call from pt's wife, [name]. She state Jiaire will need refill on his prednisone by Sunday but is insure of dosage. Does he need to continue 40 mg day or start decreasing. Advised that I would check with Dr. Lorenso Courier and call them back  Per Dr. Lorenso Courier: Decrease prednisone to 20 mg daily starting on 11/09/20 for 2 weeks then go to 10 mg daily for 2 weeks. Prescriptions have been escribed to pt's pharmacy   TCT patient and made him aware.

## 2020-11-13 ENCOUNTER — Inpatient Hospital Stay: Payer: Medicare PPO

## 2020-11-13 ENCOUNTER — Other Ambulatory Visit: Payer: Self-pay

## 2020-11-13 ENCOUNTER — Other Ambulatory Visit: Payer: Self-pay | Admitting: *Deleted

## 2020-11-13 DIAGNOSIS — C8338 Diffuse large B-cell lymphoma, lymph nodes of multiple sites: Secondary | ICD-10-CM | POA: Diagnosis not present

## 2020-11-13 DIAGNOSIS — Z833 Family history of diabetes mellitus: Secondary | ICD-10-CM | POA: Diagnosis not present

## 2020-11-13 DIAGNOSIS — Z79899 Other long term (current) drug therapy: Secondary | ICD-10-CM | POA: Diagnosis not present

## 2020-11-13 DIAGNOSIS — C8333 Diffuse large B-cell lymphoma, intra-abdominal lymph nodes: Secondary | ICD-10-CM

## 2020-11-13 DIAGNOSIS — Z95828 Presence of other vascular implants and grafts: Secondary | ICD-10-CM

## 2020-11-13 DIAGNOSIS — R0602 Shortness of breath: Secondary | ICD-10-CM | POA: Diagnosis not present

## 2020-11-13 DIAGNOSIS — Z836 Family history of other diseases of the respiratory system: Secondary | ICD-10-CM | POA: Diagnosis not present

## 2020-11-13 DIAGNOSIS — Z8049 Family history of malignant neoplasm of other genital organs: Secondary | ICD-10-CM | POA: Diagnosis not present

## 2020-11-13 DIAGNOSIS — Z8249 Family history of ischemic heart disease and other diseases of the circulatory system: Secondary | ICD-10-CM | POA: Diagnosis not present

## 2020-11-13 DIAGNOSIS — Z800 Family history of malignant neoplasm of digestive organs: Secondary | ICD-10-CM | POA: Diagnosis not present

## 2020-11-13 LAB — CBC WITH DIFFERENTIAL (CANCER CENTER ONLY)
Abs Immature Granulocytes: 0.03 10*3/uL (ref 0.00–0.07)
Basophils Absolute: 0 10*3/uL (ref 0.0–0.1)
Basophils Relative: 0 %
Eosinophils Absolute: 0.1 10*3/uL (ref 0.0–0.5)
Eosinophils Relative: 1 %
HCT: 33 % — ABNORMAL LOW (ref 39.0–52.0)
Hemoglobin: 10.1 g/dL — ABNORMAL LOW (ref 13.0–17.0)
Immature Granulocytes: 0 %
Lymphocytes Relative: 8 %
Lymphs Abs: 0.7 10*3/uL (ref 0.7–4.0)
MCH: 28.4 pg (ref 26.0–34.0)
MCHC: 30.6 g/dL (ref 30.0–36.0)
MCV: 92.7 fL (ref 80.0–100.0)
Monocytes Absolute: 0.5 10*3/uL (ref 0.1–1.0)
Monocytes Relative: 6 %
Neutro Abs: 7.5 10*3/uL (ref 1.7–7.7)
Neutrophils Relative %: 85 %
Platelet Count: 186 10*3/uL (ref 150–400)
RBC: 3.56 MIL/uL — ABNORMAL LOW (ref 4.22–5.81)
RDW: 21.1 % — ABNORMAL HIGH (ref 11.5–15.5)
WBC Count: 8.9 10*3/uL (ref 4.0–10.5)
nRBC: 0 % (ref 0.0–0.2)

## 2020-11-13 LAB — CMP (CANCER CENTER ONLY)
ALT: 61 U/L — ABNORMAL HIGH (ref 0–44)
AST: 35 U/L (ref 15–41)
Albumin: 3.8 g/dL (ref 3.5–5.0)
Alkaline Phosphatase: 48 U/L (ref 38–126)
Anion gap: 10 (ref 5–15)
BUN: 38 mg/dL — ABNORMAL HIGH (ref 8–23)
CO2: 26 mmol/L (ref 22–32)
Calcium: 8.7 mg/dL — ABNORMAL LOW (ref 8.9–10.3)
Chloride: 107 mmol/L (ref 98–111)
Creatinine: 1.16 mg/dL (ref 0.61–1.24)
GFR, Estimated: >60 mL/min (ref >60)
Glucose, Bld: 102 mg/dL — ABNORMAL HIGH (ref 70–99)
Potassium: 4 mmol/L (ref 3.5–5.1)
Sodium: 143 mmol/L (ref 135–145)
Total Bilirubin: 0.4 mg/dL (ref 0.3–1.2)
Total Protein: 6.1 g/dL — ABNORMAL LOW (ref 6.5–8.1)

## 2020-11-13 LAB — TSH: TSH: 1.345 u[IU]/mL (ref 0.320–4.118)

## 2020-11-13 LAB — LACTATE DEHYDROGENASE: LDH: 181 U/L (ref 98–192)

## 2020-11-13 MED ORDER — SODIUM CHLORIDE 0.9% FLUSH
10.0000 mL | INTRAVENOUS | Status: DC | PRN
  Administered 2020-11-13: 10 mL via INTRAVENOUS
  Filled 2020-11-13: qty 10

## 2020-11-13 MED ORDER — HEPARIN SOD (PORK) LOCK FLUSH 100 UNIT/ML IV SOLN
500.0000 [IU] | Freq: Once | INTRAVENOUS | Status: AC
  Administered 2020-11-13: 500 [IU] via INTRAVENOUS
  Filled 2020-11-13: qty 5

## 2020-11-13 NOTE — Patient Instructions (Signed)

## 2020-11-17 ENCOUNTER — Telehealth: Payer: Self-pay | Admitting: *Deleted

## 2020-11-17 NOTE — Telephone Encounter (Signed)
TCT patient regarding recent lab results.  Spoke with wife, [name]. Advise dthat Roshad's labs are good and stable at this time.. She appreciated the info.  She states that Ona is not feeling good-upper respiratory issues. His wife is being treated for bronchitis with a Zpack. Asked her if they had been tested for Covid. She said they had not. Advised that they should get tested as soon as they can and let us and their PCP know the results.  Asked [name] to call back with the Covid results.  She stated she would.

## 2020-11-17 NOTE — Telephone Encounter (Signed)
-----   Message from Orson Slick, MD sent at 11/14/2020  4:53 PM EST ----- Please let Mr. Oros know his Calcium looks great at 8.7 and his Hgb is rising, currently at 10.1. Everything looks stable at this time. We will see him back as scheduled on 11/27/2020.    ----- Message ----- From: Buel Ream, Lab [facility] [place]: 11/13/2020   9:00 AM EST To: Orson Slick, MD

## 2020-11-20 DIAGNOSIS — C4442 Squamous cell carcinoma of skin of scalp and neck: Secondary | ICD-10-CM | POA: Diagnosis not present

## 2020-11-20 DIAGNOSIS — C44319 Basal cell carcinoma of skin of other parts of face: Secondary | ICD-10-CM | POA: Diagnosis not present

## 2020-11-20 DIAGNOSIS — C44222 Squamous cell carcinoma of skin of right ear and external auricular canal: Secondary | ICD-10-CM | POA: Diagnosis not present

## 2020-11-21 DIAGNOSIS — J209 Acute bronchitis, unspecified: Secondary | ICD-10-CM | POA: Diagnosis not present

## 2020-11-21 DIAGNOSIS — C8330 Diffuse large B-cell lymphoma, unspecified site: Secondary | ICD-10-CM | POA: Diagnosis not present

## 2020-11-27 ENCOUNTER — Encounter: Payer: Self-pay | Admitting: Hematology and Oncology

## 2020-11-27 ENCOUNTER — Other Ambulatory Visit: Payer: Self-pay

## 2020-11-27 ENCOUNTER — Other Ambulatory Visit: Payer: Self-pay | Admitting: Hematology and Oncology

## 2020-11-27 ENCOUNTER — Inpatient Hospital Stay: Payer: Medicare PPO

## 2020-11-27 ENCOUNTER — Inpatient Hospital Stay: Payer: Medicare PPO | Attending: Hematology and Oncology | Admitting: Hematology and Oncology

## 2020-11-27 VITALS — BP 147/80 | HR 57 | Temp 98.2°F | Resp 16 | Ht 68.0 in | Wt 168.4 lb

## 2020-11-27 DIAGNOSIS — C8333 Diffuse large B-cell lymphoma, intra-abdominal lymph nodes: Secondary | ICD-10-CM

## 2020-11-27 DIAGNOSIS — Z7982 Long term (current) use of aspirin: Secondary | ICD-10-CM | POA: Diagnosis not present

## 2020-11-27 DIAGNOSIS — Z8616 Personal history of COVID-19: Secondary | ICD-10-CM | POA: Insufficient documentation

## 2020-11-27 DIAGNOSIS — Z452 Encounter for adjustment and management of vascular access device: Secondary | ICD-10-CM | POA: Diagnosis not present

## 2020-11-27 DIAGNOSIS — Z8673 Personal history of transient ischemic attack (TIA), and cerebral infarction without residual deficits: Secondary | ICD-10-CM | POA: Insufficient documentation

## 2020-11-27 DIAGNOSIS — Z79899 Other long term (current) drug therapy: Secondary | ICD-10-CM | POA: Diagnosis not present

## 2020-11-27 DIAGNOSIS — N183 Chronic kidney disease, stage 3 unspecified: Secondary | ICD-10-CM | POA: Insufficient documentation

## 2020-11-27 DIAGNOSIS — Z95828 Presence of other vascular implants and grafts: Secondary | ICD-10-CM

## 2020-11-27 DIAGNOSIS — C8338 Diffuse large B-cell lymphoma, lymph nodes of multiple sites: Secondary | ICD-10-CM | POA: Diagnosis not present

## 2020-11-27 LAB — CMP (CANCER CENTER ONLY)
ALT: 40 U/L (ref 0–44)
AST: 25 U/L (ref 15–41)
Albumin: 3.9 g/dL (ref 3.5–5.0)
Alkaline Phosphatase: 48 U/L (ref 38–126)
Anion gap: 6 (ref 5–15)
BUN: 31 mg/dL — ABNORMAL HIGH (ref 8–23)
CO2: 27 mmol/L (ref 22–32)
Calcium: 9.2 mg/dL (ref 8.9–10.3)
Chloride: 107 mmol/L (ref 98–111)
Creatinine: 1.15 mg/dL (ref 0.61–1.24)
GFR, Estimated: >60 mL/min (ref >60)
Glucose, Bld: 119 mg/dL — ABNORMAL HIGH (ref 70–99)
Potassium: 4 mmol/L (ref 3.5–5.1)
Sodium: 140 mmol/L (ref 135–145)
Total Bilirubin: 0.4 mg/dL (ref 0.3–1.2)
Total Protein: 6.4 g/dL — ABNORMAL LOW (ref 6.5–8.1)

## 2020-11-27 LAB — CBC WITH DIFFERENTIAL (CANCER CENTER ONLY)
Abs Immature Granulocytes: 0.05 10*3/uL (ref 0.00–0.07)
Basophils Absolute: 0 10*3/uL (ref 0.0–0.1)
Basophils Relative: 0 %
Eosinophils Absolute: 0 10*3/uL (ref 0.0–0.5)
Eosinophils Relative: 0 %
HCT: 35.9 % — ABNORMAL LOW (ref 39.0–52.0)
Hemoglobin: 11.4 g/dL — ABNORMAL LOW (ref 13.0–17.0)
Immature Granulocytes: 1 %
Lymphocytes Relative: 11 %
Lymphs Abs: 1.1 10*3/uL (ref 0.7–4.0)
MCH: 29.7 pg (ref 26.0–34.0)
MCHC: 31.8 g/dL (ref 30.0–36.0)
MCV: 93.5 fL (ref 80.0–100.0)
Monocytes Absolute: 0.4 10*3/uL (ref 0.1–1.0)
Monocytes Relative: 4 %
Neutro Abs: 8.1 10*3/uL — ABNORMAL HIGH (ref 1.7–7.7)
Neutrophils Relative %: 84 %
Platelet Count: 213 10*3/uL (ref 150–400)
RBC: 3.84 MIL/uL — ABNORMAL LOW (ref 4.22–5.81)
RDW: 20 % — ABNORMAL HIGH (ref 11.5–15.5)
WBC Count: 9.7 10*3/uL (ref 4.0–10.5)
nRBC: 0 % (ref 0.0–0.2)

## 2020-11-27 LAB — LACTATE DEHYDROGENASE: LDH: 205 U/L — ABNORMAL HIGH (ref 98–192)

## 2020-11-27 MED ORDER — HEPARIN SOD (PORK) LOCK FLUSH 100 UNIT/ML IV SOLN
500.0000 [IU] | Freq: Once | INTRAVENOUS | Status: AC
  Administered 2020-11-27: 500 [IU] via INTRAVENOUS
  Filled 2020-11-27: qty 5

## 2020-11-27 MED ORDER — SODIUM CHLORIDE 0.9% FLUSH
10.0000 mL | Freq: Once | INTRAVENOUS | Status: AC
  Administered 2020-11-27: 10 mL via INTRAVENOUS
  Filled 2020-11-27: qty 10

## 2020-11-27 NOTE — Progress Notes (Signed)
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   1) 04/17/2020:  CT A/P showed dominant nodal mass (11.5 x 10.9 cm) within the jejunal mesentery, highly suspicious for lymphoma. Additionally there are left lower lobe pleural-based pulmonary nodules 2) 04/24/2020: establish care with Dr. Lorenso Courier 3) 04/28/2020:  PET CT scan shows Large solid left mesenteric mass 13.0 cm with maximum SUV of 21.2, [place] 5 with local hypermetabolic porta hepatis and retroperitoneal lymph nodes. Constitutes bulky Stage I disease.  4) 05/27/2020-05/31/2020: Cycle 1 of R-EPOCH 5) 06/16/2020-06/23/2020: Cycle 2 of R-EPOCH 6) 07/07/2020-07/11/2020: Cycle 3 of R-EPOCH  7) 07/28/2020-08/01/2020: Cycle 4 of R-EPOCH 8) 08/18/2020-08/22/2020: Cycle 5 of R-EPOCH 9) 09/08/2020-09/12/2020: Cycle 6 of R-EPOCH 10) 10/06/2020: PET CT scan shows decreased size of dominant mesenteric mass, however there was hypermetabolic activity within a single left anterior mesenteric lymph node   Interval History:  Connor Adams 75 y.o. male with medical history significant for Stage I DLBCL who presents for a follow up visit. The patient was last seen on 10/30/2020 . At that time his marked hypercalcemia had corrected. He presents today for follow-up.  On exam today Connor Adams is accompanied by his wife by phone. He reports he has been quite well in the interim since our last visit.  He is beginning to grow back some of his hair and his weight is currently up to 168 pounds, 10 pounds heavier than he was in February.  He reports that he feels good overall and that he is regaining mostly feeling back in his fingers.  He was having some numbness that extended to the full length of the finger, but now is limited to mostly just the tips.   He also feels like he is getting back to full strength up and working out several times per week.  He has not been having any issues with fatigue, shortness of breath, or poor appetite.  He notes that food is tasting and smelling better and therefore he is eating more.  He currently denies having issues with fevers, chills, sweats, nausea, vomiting or diarrhea.  A full 10 point ROS is listed below.  MEDICAL HISTORY:  Past Medical History:  Diagnosis Date  . Arthritis   . Chronic kidney disease (CKD), stage III (moderate) (HCC)   . COVID-19   . GERD (gastroesophageal reflux disease)    occ  . Hemorrhoids   . History of ETT 3/05   low risk  . History of hiatal hernia   . History of kidney stones   . HLD (hyperlipidemia)   . HTN (hypertension)   . Left inguinal hernia   . Melanoma ([place])    excied  . 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 Stroke [facility]) 2011   tia   . TIA (transient ischemic attack) 4/11   associated w slurred speecha ndmild facial droop. lasted for about 10 minutes. Had MRI, etc with [facility] that per his report was ok (dr. Stephannie Li). Echo (4/11): EF 55-60%, no regional WMAs, normal diastolic function, mild LAE, no source of embolus    SURGICAL HISTORY: Past Surgical History:  Procedure Laterality Date  . BMI 30.2 muscular  12/08/04  . curretage actinic keratosis R ear  01/20/05  . ETT low prob of CAD  11/21/03  . excision  melanoma in situ back  01/20/05  . hemorrhoids sclerosed at colonoscopy  12/19/05  . IR IMAGING GUIDED PORT INSERTION  05/19/2020  . LAPAROSCOPIC INGUINAL HERNIA REPAIR Left 12/20/1995  . LUMBAR DISC SURGERY    . NERVE REPAIR     back of neck  . retinal laser surgery Left 09/20/1986  . SEPTOPLASTY  11/19/98  . TOTAL KNEE ARTHROPLASTY Left 12/13/2016   Procedure: TOTAL KNEE ARTHROPLASTY WITH RIGHT KNEE CORTISONE INJECTION;  Surgeon: Frederik Pear, MD;  Location: [facility];  Service: Orthopedics;  Laterality: Left;  . uric acid 6.9  12/22/04  . x-ray  l great toe  07/21/00    SOCIAL HISTORY: Social History   Socioeconomic History  . Marital status: Married    Spouse name: [name]  . Number of children: 2  . Years of education: 22  . Highest education level: Not on file  Occupational History  . Occupation: Systems developer: [employer]  Tobacco Use  . Smoking status: Never Smoker  . Smokeless tobacco: Never Used  Vaping Use  . Vaping Use: Never used  Substance and Sexual Activity  . Alcohol use: No  . Drug use: No  . Sexual activity: Yes  Other Topics Concern  . Not on file  Social History Narrative   Married to [employer] at [employer].   Son, [name] married   Son, [name], Married.    Caffeine use: 1 cup coffee per day   Social Determinants of Health   Financial Resource Strain: Not on file  Food Insecurity: Not on file  Transportation Needs: Not on file  Physical Activity: Not on file  Stress: Not on file  Social Connections: Not on file  Intimate Partner Violence: Not on file    FAMILY HISTORY: Family History  Problem Relation Age of Onset  . Liver cancer Sister   . Heart failure Brother 44       CABGx4  . Heart attack Mother 80  . Uterine cancer Mother   . Heart attack Father 54  . Asthma Son   . Diabetes Brother   . Prostate cancer Brother     ALLERGIES:  has No Known Allergies.  MEDICATIONS:  Current Outpatient Medications  Medication Sig Dispense Refill  . acetaminophen (TYLENOL) 325 MG tablet Take 2 tablets (650 mg total) by mouth every 6 (six) hours as needed for mild pain, moderate pain or fever.    Marland Kitchen amLODipine (NORVASC) 2.5 MG tablet Take 1 tablet (2.5 mg total) by mouth daily. 30 tablet 2  . aspirin EC 81 MG tablet Take 81 mg by mouth daily. Swallow whole.    . bisacodyl (DULCOLAX) 10 MG suppository Place 1 suppository (10 mg total) rectally as needed for moderate constipation. 12 suppository 0  . feeding supplement, ENSURE ENLIVE, (ENSURE ENLIVE) LIQD  Take 237 mLs by mouth 2 (two) times daily between meals. 237 mL 12  . fluticasone (FLONASE) 50 MCG/ACT nasal spray Place 1-2 sprays into both nostrils daily as needed for allergies or rhinitis.  2  . hydrocortisone (ANUSOL-HC) 2.5 % rectal cream Place 1 application rectally daily as needed. (Patient taking differently: Place 1 application rectally daily as needed for hemorrhoids or anal itching.) 30 g 0  . Multiple Vitamin (MULTI VITAMIN) TABS Take 1 tablet by mouth daily.    . mupirocin cream (BACTROBAN) 2 % Apply 1 application topically daily.    . polyethylene glycol (MIRALAX / GLYCOLAX) 17 g packet  Take 17 g by mouth daily as needed for moderate constipation. 14 each 0  . polyvinyl alcohol (LIQUIFILM TEARS) 1.4 % ophthalmic solution Place 1 drop into both eyes daily as needed for dry eyes.    . potassium chloride SA (KLOR-CON) 20 MEQ tablet Take 1 tablet (20 mEq total) by mouth daily. 14 tablet 0  . rosuvastatin (CRESTOR) 5 MG tablet Take 5 mg by mouth daily.    Marland Kitchen senna-docusate (SENOKOT-S) 8.6-50 MG tablet Take 2 tablets by mouth 2 (two) times daily as needed for mild constipation.     No current facility-administered medications for this visit.   Facility-Administered Medications Ordered in Other Visits  Medication Dose Route Frequency Provider Last Rate Last Admin  . heparin lock flush 100 unit/mL  500 Units Intravenous Once Narda Rutherford T IV, MD      . sodium chloride flush (NS) 0.9 % injection 10 mL  10 mL Intravenous Once Orson Slick, MD        REVIEW OF SYSTEMS:   Constitutional: ( - ) fevers, ( - )  chills , ( - ) night sweats Eyes: ( - ) blurriness of vision, ( - ) double vision, ( - ) watery eyes Ears, nose, mouth, throat, and face: ( - ) mucositis, ( - ) sore throat Respiratory: ( - ) cough, ( - ) dyspnea, ( - ) wheezes Cardiovascular: ( - ) palpitation, ( - ) chest discomfort, ( - ) lower extremity swelling Gastrointestinal:  ( - ) nausea, ( - ) heartburn, ( - ) change  in bowel habits Skin: ( - ) abnormal skin rashes Lymphatics: ( - ) new lymphadenopathy, ( - ) easy bruising Neurological: ( - ) numbness, ( - ) tingling, ( - ) new weaknesses Behavioral/Psych: ( - ) mood change, ( - ) new changes  All other systems were reviewed with the patient and are negative.  PHYSICAL EXAMINATION: ECOG PERFORMANCE STATUS: 1 - Symptomatic but completely ambulatory  There were no vitals filed for this visit. There were no vitals filed for this visit.  GENERAL: well appearing elderly Caucasian male. alert, no distress and comfortable SKIN: skin color, texture, turgor are normal, no rashes or significant lesions. Bald, prior erythematous skin lesions clearing up.  EYES: conjunctiva are pink and non-injected, sclera clear LUNGS: clear to auscultation and percussion with normal breathing effort HEART: regular rate & rhythm and no murmurs and no lower extremity edema ABDOMEN: soft, nontender. No evidence of previously noted mass.  Musculoskeletal: no cyanosis of digits and no clubbing  PSYCH: alert & oriented x 3, fluent speech NEURO: no focal motor/sensory deficits  LABORATORY DATA:  I have reviewed the data as listed CBC Latest Ref Rng & Units 11/13/2020 10/30/2020 10/20/2020  WBC 4.0 - 10.5 K/uL 8.9 6.6 10.7(H)  Hemoglobin 13.0 - 17.0 g/dL 10.1(L) 9.1(L) 8.8(L)  Hematocrit 39.0 - 52.0 % 33.0(L) 29.3(L) 28.6(L)  Platelets 150 - 400 K/uL 186 262 292    CMP Latest Ref Rng & Units 11/13/2020 10/30/2020 10/21/2020  Glucose 70 - 99 mg/dL 102(H) 109(H) 187(H)  BUN 8 - 23 mg/dL 38(H) 45(H) 41(H)  Creatinine 0.61 - 1.24 mg/dL 1.16 1.23 1.72(H)  Sodium 135 - 145 mmol/L 143 143 142  Potassium 3.5 - 5.1 mmol/L 4.0 3.3(L) 3.4(L)  Chloride 98 - 111 mmol/L 107 110 107  CO2 22 - 32 mmol/L 26 25 24   Calcium 8.9 - 10.3 mg/dL 8.7(L) 9.4 11.6(H)  Total Protein 6.5 - 8.1 g/dL 6.1(L)  5.9(L) 6.2(L)  Total Bilirubin 0.3 - 1.2 mg/dL 0.4 0.3 0.5  Alkaline Phos 38 - 126 U/L 48 49 48  AST  15 - 41 U/L 35 34 24  ALT 0 - 44 U/L 61(H) 48(H) 22    RADIOGRAPHIC STUDIES: I have personally reviewed the radiological images as listed and agreed with the findings in the report: large abdominal mass markedly decreased in FDG avidity. Resolution of diffuse pulmonary nodules.   No results found.  ASSESSMENT & PLAN Connor Adams 75 y.o. male with medical history significant for Stage I DLBCL who presents for a follow up visit.  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  At this time he has completed all planned chemotherapy treatment (R-EPOCH x 6 cycles).   On exam today Connor Adams is continuing to steadily improve.  His hypercalcemia, elevation in creatinine, and leukocytosis have resolved.  Fortunately he tested negative for Covid. The etiology of his hypercalcemia and renal failure is not entirely clear. We will keep a close eye on his labs in the interim and consider repeat scan for progression of disease if these labs creep up again.  IPI Score: 2 points, good prognosis/ low-intermediate risk group (81% OS, 80% PFS)  #Hypercalcemia, resolved  #Acute Kidney Injury, resolved  --Labs today how a calcium 9.2, creatinine of 1.15.  Hemoglobin is at 11.4and white blood cell count is 9.7 --patient is COVID negative, PET CT scan from last week shows good response to chemotherapy with no strong evidence of active disease.  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  --findings consistent with a double hit DLBCL Treatment of choice is R-EPOCH chemotherapy in the inpatient setting. --he is currently s/p Cycle 6 administered on 09/08/2020 --patient has a port in  place. TTE was performed and showed strong baseline heart function. --repeat evaluation PET CT scan shows good response to chemotherapy. Repeat PET CT scan due early April 2022 --RTC in 4 weeks following repeat PET.   #Lung Nodules, resolved -- noted on interim PET CT scan after Cycle 4.  --unclear if these represent areas of infection, metastatic disease, or progression of lymphoma --pulmonology consulted, appreciate their input.  --will monitor respiratory status closely --repeat PET CT in Jan 2022 showed resolution of the lesions  #Oral Ulcers, resolved -- oral lesion on left side of tongue. During prior cycles was noted to be on roof of mouth and buccal membranes --patient used magic mouthwash --no currently present  #Symptom Management --discontinued allopurinol 300mg  PO daily  --prescribed ondansetron 8mg  PO q8H PRN and compazine 10mg  PO q6H prn for nausea --discontinue Protonix/sulcrafate as these were administered in the setting of high dose steroids --encourage daily BM, prescribed senna docusate and miralax.  No orders of the defined types were placed in this encounter.  All questions were answered. The patient knows to call the clinic with any problems, questions or concerns.  A total of more than 30 minutes were spent on this encounter and over half of that time was spent on counseling and coordination of care as outlined above.   [name], MD Department of Hematology/Oncology [place] [facility] Phone: [phone] Pager: [phone] Email:  [email or web address]  11/27/2020 10:50 AM

## 2020-11-27 NOTE — Patient Instructions (Signed)

## 2020-12-01 ENCOUNTER — Telehealth: Payer: Self-pay | Admitting: Hematology and Oncology

## 2020-12-01 NOTE — Telephone Encounter (Signed)
Scheduled per los. Called and spoke with patient. Confirmed appt 

## 2020-12-17 ENCOUNTER — Telehealth: Payer: Self-pay | Admitting: Hematology and Oncology

## 2020-12-17 NOTE — Telephone Encounter (Signed)
R/s appts per 3/30 sch msg. Pt already aware.

## 2020-12-20 ENCOUNTER — Other Ambulatory Visit (HOSPITAL_COMMUNITY): Payer: Self-pay

## 2020-12-25 ENCOUNTER — Inpatient Hospital Stay: Payer: Medicare PPO

## 2020-12-25 ENCOUNTER — Inpatient Hospital Stay: Payer: Medicare PPO | Admitting: Hematology and Oncology

## 2020-12-30 ENCOUNTER — Ambulatory Visit (HOSPITAL_COMMUNITY)
Admission: RE | Admit: 2020-12-30 | Discharge: 2020-12-30 | Disposition: A | Discharge status: Home / Self Care | Payer: Medicare PPO | Source: Ambulatory Visit | Attending: Hematology and Oncology | Admitting: Hematology and Oncology

## 2020-12-30 ENCOUNTER — Other Ambulatory Visit: Payer: Self-pay

## 2020-12-30 DIAGNOSIS — R160 Hepatomegaly, not elsewhere classified: Secondary | ICD-10-CM | POA: Insufficient documentation

## 2020-12-30 DIAGNOSIS — C8333 Diffuse large B-cell lymphoma, intra-abdominal lymph nodes: Secondary | ICD-10-CM | POA: Insufficient documentation

## 2020-12-30 DIAGNOSIS — R918 Other nonspecific abnormal finding of lung field: Secondary | ICD-10-CM | POA: Insufficient documentation

## 2020-12-30 LAB — GLUCOSE, CAPILLARY: Glucose-Capillary: 109 mg/dL — ABNORMAL HIGH (ref 70–99)

## 2020-12-30 MED ORDER — FLUDEOXYGLUCOSE F - 18 (FDG) INJECTION
8.7000 | Freq: Once | INTRAVENOUS | Status: AC | PRN
  Administered 2020-12-30: 8.34 via INTRAVENOUS

## 2021-01-01 ENCOUNTER — Inpatient Hospital Stay: Payer: Medicare PPO | Admitting: Hematology and Oncology

## 2021-01-01 ENCOUNTER — Other Ambulatory Visit: Payer: Self-pay | Admitting: Hematology and Oncology

## 2021-01-01 ENCOUNTER — Other Ambulatory Visit: Payer: Self-pay

## 2021-01-01 ENCOUNTER — Inpatient Hospital Stay: Payer: Medicare PPO | Attending: Hematology and Oncology

## 2021-01-01 ENCOUNTER — Inpatient Hospital Stay: Payer: Medicare PPO

## 2021-01-01 VITALS — BP 150/98 | HR 60 | Temp 97.7°F | Resp 18 | Ht 68.0 in | Wt 179.9 lb

## 2021-01-01 DIAGNOSIS — Z95828 Presence of other vascular implants and grafts: Secondary | ICD-10-CM | POA: Diagnosis not present

## 2021-01-01 DIAGNOSIS — C8333 Diffuse large B-cell lymphoma, intra-abdominal lymph nodes: Secondary | ICD-10-CM

## 2021-01-01 DIAGNOSIS — C8330 Diffuse large B-cell lymphoma, unspecified site: Secondary | ICD-10-CM | POA: Diagnosis not present

## 2021-01-01 DIAGNOSIS — Z79899 Other long term (current) drug therapy: Secondary | ICD-10-CM | POA: Insufficient documentation

## 2021-01-01 DIAGNOSIS — C8338 Diffuse large B-cell lymphoma, lymph nodes of multiple sites: Secondary | ICD-10-CM | POA: Diagnosis present

## 2021-01-01 LAB — CMP (CANCER CENTER ONLY)
ALT: 26 U/L (ref 0–44)
AST: 30 U/L (ref 15–41)
Albumin: 4 g/dL (ref 3.5–5.0)
Alkaline Phosphatase: 49 U/L (ref 38–126)
Anion gap: 10 (ref 5–15)
BUN: 24 mg/dL — ABNORMAL HIGH (ref 8–23)
CO2: 27 mmol/L (ref 22–32)
Calcium: 9.1 mg/dL (ref 8.9–10.3)
Chloride: 106 mmol/L (ref 98–111)
Creatinine: 1.19 mg/dL (ref 0.61–1.24)
GFR, Estimated: >60 mL/min (ref >60)
Glucose, Bld: 110 mg/dL — ABNORMAL HIGH (ref 70–99)
Potassium: 4 mmol/L (ref 3.5–5.1)
Sodium: 143 mmol/L (ref 135–145)
Total Bilirubin: 0.4 mg/dL (ref 0.3–1.2)
Total Protein: 6.4 g/dL — ABNORMAL LOW (ref 6.5–8.1)

## 2021-01-01 LAB — CBC WITH DIFFERENTIAL (CANCER CENTER ONLY)
Abs Immature Granulocytes: 0.01 10*3/uL (ref 0.00–0.07)
Basophils Absolute: 0.1 10*3/uL (ref 0.0–0.1)
Basophils Relative: 2 %
Eosinophils Absolute: 0.1 10*3/uL (ref 0.0–0.5)
Eosinophils Relative: 3 %
HCT: 37.7 % — ABNORMAL LOW (ref 39.0–52.0)
Hemoglobin: 12.2 g/dL — ABNORMAL LOW (ref 13.0–17.0)
Immature Granulocytes: 0 %
Lymphocytes Relative: 33 %
Lymphs Abs: 1.3 10*3/uL (ref 0.7–4.0)
MCH: 30.1 pg (ref 26.0–34.0)
MCHC: 32.4 g/dL (ref 30.0–36.0)
MCV: 93.1 fL (ref 80.0–100.0)
Monocytes Absolute: 0.6 10*3/uL (ref 0.1–1.0)
Monocytes Relative: 14 %
Neutro Abs: 2 10*3/uL (ref 1.7–7.7)
Neutrophils Relative %: 48 %
Platelet Count: 176 10*3/uL (ref 150–400)
RBC: 4.05 MIL/uL — ABNORMAL LOW (ref 4.22–5.81)
RDW: 16.7 % — ABNORMAL HIGH (ref 11.5–15.5)
WBC Count: 4.1 10*3/uL (ref 4.0–10.5)
nRBC: 0 % (ref 0.0–0.2)

## 2021-01-01 LAB — LACTATE DEHYDROGENASE: LDH: 217 U/L — ABNORMAL HIGH (ref 98–192)

## 2021-01-01 MED ORDER — HEPARIN SOD (PORK) LOCK FLUSH 100 UNIT/ML IV SOLN
500.0000 [IU] | Freq: Once | INTRAVENOUS | Status: AC
Start: 2021-01-01 — End: 2021-01-01
  Administered 2021-01-01: 500 [IU] via INTRAVENOUS
  Filled 2021-01-01: qty 5

## 2021-01-01 MED ORDER — SODIUM CHLORIDE 0.9% FLUSH
10.0000 mL | INTRAVENOUS | Status: DC | PRN
  Administered 2021-01-01: 10 mL
  Filled 2021-01-01: qty 10

## 2021-01-01 NOTE — Patient Instructions (Signed)

## 2021-01-03 ENCOUNTER — Encounter: Payer: Self-pay | Admitting: Hematology and Oncology

## 2021-01-03 NOTE — Progress Notes (Signed)
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   1) 04/17/2020:  CT A/P showed dominant nodal mass (11.5 x 10.9 cm) within the jejunal mesentery, highly suspicious for lymphoma. Additionally there are left lower lobe pleural-based pulmonary nodules 2) 04/24/2020: establish care with Dr. Lorenso Courier 3) 04/28/2020:  PET CT scan shows Large solid left mesenteric mass 13.0 cm with maximum SUV of 21.2, Deauville 5 with local hypermetabolic porta hepatis and retroperitoneal lymph nodes. Constitutes bulky Stage I disease.  4) 05/27/2020-05/31/2020: Cycle 1 of R-EPOCH 5) 06/16/2020-06/23/2020: Cycle 2 of R-EPOCH 6) 07/07/2020-07/11/2020: Cycle 3 of R-EPOCH  7) 07/28/2020-08/01/2020: Cycle 4 of R-EPOCH 8) 08/18/2020-08/22/2020: Cycle 5 of R-EPOCH 9) 09/08/2020-09/12/2020: Cycle 6 of R-EPOCH 10) 10/06/2020: PET CT scan shows decreased size of dominant mesenteric mass, however there was hypermetabolic activity within a single left anterior mesenteric lymph node   Interval History:  Connor Adams 75 y.o. male with medical history significant for Stage I DLBCL who presents for a follow up visit. The patient was last seen on 11/27/2020 .  In the interim since his last visit he had a PET CT scan which showed no overt signs of recurrence. He presents today for follow-up.  On exam today Connor Adams is accompanied by his wife.  He reports that he has been doing great in the interim since her last visit.  He has been very physically active and has gained an impressive 11 pounds since March.  He is up to 179 from 168 previously.  He notes that he has a bruise on his right arm from working on a lawnmower and reports that he has been outside and working quite hard with no difficulties with his energy level.  He  has no real concerns or complaints today and is quite happy to be is fit and healthy as he is.  He currently denies having issues with fevers, chills, sweats, nausea, vomiting or diarrhea.  A full 10 point ROS is listed below.  MEDICAL HISTORY:  Past Medical History:  Diagnosis Date  . Arthritis   . Chronic kidney disease (CKD), stage III (moderate) (HCC)   . COVID-19   . GERD (gastroesophageal reflux disease)    occ  . Hemorrhoids   . History of ETT 3/05   low risk  . History of hiatal hernia   . History of kidney stones   . HLD (hyperlipidemia)   . HTN (hypertension)   . Left inguinal hernia   . Melanoma ([place])    excied  . 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 Stroke [facility]) 2011   tia   . TIA (transient ischemic attack) 4/11   associated w slurred speecha ndmild facial droop. lasted for about 10 minutes. Had MRI, etc with [facility] that per his report was ok (dr. Stephannie Li). Echo (4/11): EF 55-60%, no regional WMAs, normal diastolic function, mild LAE, no source of embolus    SURGICAL HISTORY: Past Surgical History:  Procedure Laterality Date  . BMI 30.2 muscular  12/08/04  . curretage actinic keratosis R ear  01/20/05  . ETT low prob of CAD  11/21/03  . excision melanoma in situ back  01/20/05  . hemorrhoids sclerosed at colonoscopy  12/19/05  . IR IMAGING GUIDED PORT INSERTION  05/19/2020  . LAPAROSCOPIC INGUINAL HERNIA  REPAIR Left 12/20/1995  . LUMBAR DISC SURGERY    . NERVE REPAIR     back of neck  . retinal laser surgery Left 09/20/1986  . SEPTOPLASTY  11/19/98  . TOTAL KNEE ARTHROPLASTY Left 12/13/2016   Procedure: TOTAL KNEE ARTHROPLASTY WITH RIGHT KNEE CORTISONE INJECTION;  Surgeon: Frederik Pear, MD;  Location: [facility];  Service: Orthopedics;  Laterality: Left;  . uric acid 6.9  12/22/04  . x-ray l great toe  07/21/00    SOCIAL HISTORY: Social History   Socioeconomic History  . Marital status: Married    Spouse name: [name]  . Number of children: 2  . Years  of education: 52  . Highest education level: Not on file  Occupational History  . Occupation: Systems developer: [employer]  Tobacco Use  . Smoking status: Never Smoker  . Smokeless tobacco: Never Used  Vaping Use  . Vaping Use: Never used  Substance and Sexual Activity  . Alcohol use: No  . Drug use: No  . Sexual activity: Yes  Other Topics Concern  . Not on file  Social History Narrative   Married to [employer] at [employer].   Son, [name] married   Son, [name], Married.    Caffeine use: 1 cup coffee per day   Social Determinants of Health   Financial Resource Strain: Not on file  Food Insecurity: Not on file  Transportation Needs: Not on file  Physical Activity: Not on file  Stress: Not on file  Social Connections: Not on file  Intimate Partner Violence: Not on file    FAMILY HISTORY: Family History  Problem Relation Age of Onset  . Liver cancer Sister   . Heart failure Brother 44       CABGx4  . Heart attack Mother 15  . Uterine cancer Mother   . Heart attack Father 68  . Asthma Son   . Diabetes Brother   . Prostate cancer Brother     ALLERGIES:  has No Known Allergies.  MEDICATIONS:  Current Outpatient Medications  Medication Sig Dispense Refill  . acetaminophen (TYLENOL) 325 MG tablet Take 2 tablets (650 mg total) by mouth every 6 (six) hours as needed for mild pain, moderate pain or fever.    Marland Kitchen amLODipine (NORVASC) 2.5 MG tablet Take 1 tablet (2.5 mg total) by mouth daily. 30 tablet 2  . aspirin EC 81 MG tablet Take 81 mg by mouth daily. Swallow whole.    . feeding supplement, ENSURE ENLIVE, (ENSURE ENLIVE) LIQD Take 237 mLs by mouth 2 (two) times daily between meals. 237 mL 12  . fluticasone (FLONASE) 50 MCG/ACT nasal spray Place 1-2 sprays into both nostrils daily as needed for allergies or rhinitis.  2  . hydrocortisone (ANUSOL-HC) 2.5 % rectal cream Place 1 application rectally daily as needed. (Patient taking  differently: Place 1 application rectally daily as needed for hemorrhoids or anal itching.) 30 g 0  . Multiple Vitamin (MULTI VITAMIN) TABS Take 1 tablet by mouth daily.    . mupirocin cream (BACTROBAN) 2 % Apply 1 application topically daily.    . polyethylene glycol (MIRALAX / GLYCOLAX) 17 g packet Take 17 g by mouth daily as needed for moderate constipation. 14 each 0  . polyvinyl alcohol (LIQUIFILM TEARS) 1.4 % ophthalmic solution Place 1 drop into both eyes daily as needed for dry eyes.    . potassium chloride SA (KLOR-CON) 20 MEQ tablet Take 1 tablet (  20 mEq total) by mouth daily. 14 tablet 0  . rosuvastatin (CRESTOR) 5 MG tablet Take 5 mg by mouth daily.    Marland Kitchen senna-docusate (SENOKOT-S) 8.6-50 MG tablet Take 2 tablets by mouth 2 (two) times daily as needed for mild constipation.     No current facility-administered medications for this visit.    REVIEW OF SYSTEMS:   Constitutional: ( - ) fevers, ( - )  chills , ( - ) night sweats Eyes: ( - ) blurriness of vision, ( - ) double vision, ( - ) watery eyes Ears, nose, mouth, throat, and face: ( - ) mucositis, ( - ) sore throat Respiratory: ( - ) cough, ( - ) dyspnea, ( - ) wheezes Cardiovascular: ( - ) palpitation, ( - ) chest discomfort, ( - ) lower extremity swelling Gastrointestinal:  ( - ) nausea, ( - ) heartburn, ( - ) change in bowel habits Skin: ( - ) abnormal skin rashes Lymphatics: ( - ) new lymphadenopathy, ( - ) easy bruising Neurological: ( - ) numbness, ( - ) tingling, ( - ) new weaknesses Behavioral/Psych: ( - ) mood change, ( - ) new changes  All other systems were reviewed with the patient and are negative.  PHYSICAL EXAMINATION: ECOG PERFORMANCE STATUS: 1 - Symptomatic but completely ambulatory  Vitals:   01/01/21 1420  BP: (!) 150/98  Pulse: 60  Resp: 18  Temp: 97.7 F (36.5 C)  SpO2: 99%   Filed Weights   01/01/21 1420  Weight: 179 lb 14.4 oz (81.6 kg)    GENERAL: well appearing elderly Caucasian male.  alert, no distress and comfortable SKIN: skin color, texture, turgor are normal, no rashes or significant lesions. Bald, prior erythematous skin lesions clearing up.  EYES: conjunctiva are pink and non-injected, sclera clear LUNGS: clear to auscultation and percussion with normal breathing effort HEART: regular rate & rhythm and no murmurs and no lower extremity edema ABDOMEN: soft, nontender. No evidence of previously noted mass.  Musculoskeletal: no cyanosis of digits and no clubbing  PSYCH: alert & oriented x 3, fluent speech NEURO: no focal motor/sensory deficits  LABORATORY DATA:  I have reviewed the data as listed CBC Latest Ref Rng & Units 01/01/2021 11/27/2020 11/13/2020  WBC 4.0 - 10.5 K/uL 4.1 9.7 8.9  Hemoglobin 13.0 - 17.0 g/dL 12.2(L) 11.4(L) 10.1(L)  Hematocrit 39.0 - 52.0 % 37.7(L) 35.9(L) 33.0(L)  Platelets 150 - 400 K/uL 176 213 186    CMP Latest Ref Rng & Units 01/01/2021 11/27/2020 11/13/2020  Glucose 70 - 99 mg/dL 110(H) 119(H) 102(H)  BUN 8 - 23 mg/dL 24(H) 31(H) 38(H)  Creatinine 0.61 - 1.24 mg/dL 1.19 1.15 1.16  Sodium 135 - 145 mmol/L 143 140 143  Potassium 3.5 - 5.1 mmol/L 4.0 4.0 4.0  Chloride 98 - 111 mmol/L 106 107 107  CO2 22 - 32 mmol/L 27 27 26   Calcium 8.9 - 10.3 mg/dL 9.1 9.2 8.7(L)  Total Protein 6.5 - 8.1 g/dL 6.4(L) 6.4(L) 6.1(L)  Total Bilirubin 0.3 - 1.2 mg/dL 0.4 0.4 0.4  Alkaline Phos 38 - 126 U/L 49 48 48  AST 15 - 41 U/L 30 25 35  ALT 0 - 44 U/L 26 40 61(H)    RADIOGRAPHIC STUDIES: I have personally reviewed the radiological images as listed and agreed with the findings in the report: large abdominal mass markedly decreased in FDG avidity. Resolution of diffuse pulmonary nodules.   NM PET Image Restag (PS) Skull Base To Thigh  Result  Date: 12/31/2020 CLINICAL DATA:  Subsequent treatment strategy for diffuse large B-cell lymphoma. Intra-abdominal lymph nodes. EXAM: NUCLEAR MEDICINE PET SKULL BASE TO THIGH TECHNIQUE: 8.3 mCi F-18 FDG was  injected intravenously. Full-ring PET imaging was performed from the skull base to thigh after the radiotracer. CT data was obtained and used for attenuation correction and anatomic localization. Fasting blood glucose: 109 mg/dl COMPARISON:  PET-CT 10/06/2020 FINDINGS: Mediastinal blood pool activity: SUV max 1.7 Liver activity: SUV max 2.9 NECK: No hypermetabolic lymph nodes in the neck. Incidental CT findings: none CHEST: Stable peripheral nodules in the RIGHT upper lobe without with low level metabolic activity. For example 7 mm nodule on image 68 unchanged in size with SUV max equal 1.6 compared SUV max equal 2.7. No hypermetabolic mediastinal lymph nodes. Nodule within the medial aspect of the RIGHT lower lobe is increased in size measuring 1.3 by 0.9 cm increased from mild linear thickening and now with significant metabolic activity (SUV max equal 4.0) on image 96. Incidental CT findings: none ABDOMEN/PELVIS: Ill-defined mesenteric thickening in LEFT upper quadrant measures 6.4 x 3.1 cm compared to 5.8 x 3.8 cm for no interval change. Activity is low with SUV max equal 2.4 compared SUV max equal 4.3. Previous described new hypermetabolic mesenteric nodule anteriorly measures 2.3 cm (image 138) similar to to 1.8 cm. Lesion with SUV max equal 3.5 compared SUV max equal 5.5. No new mesenteric lesions. Incidental CT findings: none SKELETON: No focal hypermetabolic activity to suggest skeletal metastasis. Incidental CT findings: Stable anterolisthesis of L5 on S1. IMPRESSION: 1. New hypermetabolic nodular thickening the medial aspect of the RIGHT lower lobe is concerning for neoplasm versus atypical inflammatory response. 2. Stable LEFT upper lobe pulmonary nodules. 3. Decrease in metabolic activity LEFT upper quadrant mesenteric mass with activity now below liver activity (SUV max Deauville 3). 4. Decrease in metabolic activity of anterior abdominal mesenteric nodule with activity slightly above liver (Deauville  3/4). Electronically Signed   By: Suzy Bouchard M.D.   On: 12/31/2020 10:45    ASSESSMENT & PLAN Connor Adams 75 y.o. male with medical history significant for Stage I DLBCL who presents for a follow up visit.  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  At this time he has completed all planned chemotherapy treatment (R-EPOCH x 6 cycles).   On exam today Connor Adams is continuing to steadily improve.  His hypercalcemia, elevation in creatinine, and leukocytosis have resolved.  His hemoglobin has risen to 12.2 and his energy levels are great.  He is gaining weight and is markedly active.  Most recent PET CT scan shows some mild FDG avidity in the abdomen with an area in the lungs which is of unclear etiology.  IPI Score: 2 points, good prognosis/ low-intermediate risk group (81% OS, 80% PFS)  #Hypercalcemia, resolved  #Acute Kidney Injury, resolved  --Labs today show a calcium 9.1, creatinine of 1.19.  Hemoglobin is at 12.2 and white blood cell count is.41 --PET CT scan from last week shows good response to chemotherapy with no strong evidence of active disease.  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  --findings consistent with a double hit DLBCL Treatment of choice is R-EPOCH  chemotherapy in the inpatient setting. --he is currently s/p Cycle 6 administered on 09/08/2020 --patient has a port in place. TTE was performed and showed strong baseline heart function. --repeat evaluation PET CT scan shows good response to chemotherapy. Repeat PET CT scan  April 2022 shows no clear evidence of recurrence, but will review with [facility] team.  --RTC in 6 weeks    #Lung Nodules, resolved -- noted on interim PET CT scan after Cycle 4.  --unclear if these represent areas of infection, metastatic disease, or progression of lymphoma --pulmonology consulted, appreciate their input.  --will monitor respiratory status closely --repeat PET CT in Jan 2022 showed resolution of the lesions   No orders of the defined types were placed in this encounter.  All questions were answered. The patient knows to call the clinic with any problems, questions or concerns.  A total of more than 30 minutes were spent on this encounter and over half of that time was spent on counseling and coordination of care as outlined above.   [name], MD Department of Hematology/Oncology [place] [facility] Phone: [phone] Pager: [phone] Email: [email or web address]  01/03/2021 10:16 AM

## 2021-01-05 ENCOUNTER — Telehealth: Payer: Self-pay | Admitting: Hematology and Oncology

## 2021-01-05 NOTE — Telephone Encounter (Signed)
Scheduled appt per 4/16 sch msg. Pt aware.

## 2021-01-08 DIAGNOSIS — X32XXXD Exposure to sunlight, subsequent encounter: Secondary | ICD-10-CM | POA: Diagnosis not present

## 2021-01-08 DIAGNOSIS — L570 Actinic keratosis: Secondary | ICD-10-CM | POA: Diagnosis not present

## 2021-01-08 DIAGNOSIS — D0462 Carcinoma in situ of skin of left upper limb, including shoulder: Secondary | ICD-10-CM | POA: Diagnosis not present

## 2021-01-08 DIAGNOSIS — Z08 Encounter for follow-up examination after completed treatment for malignant neoplasm: Secondary | ICD-10-CM | POA: Diagnosis not present

## 2021-01-08 DIAGNOSIS — Z85828 Personal history of other malignant neoplasm of skin: Secondary | ICD-10-CM | POA: Diagnosis not present

## 2021-01-08 DIAGNOSIS — C44319 Basal cell carcinoma of skin of other parts of face: Secondary | ICD-10-CM | POA: Diagnosis not present

## 2021-02-19 ENCOUNTER — Other Ambulatory Visit: Payer: Self-pay | Admitting: Hematology and Oncology

## 2021-02-19 ENCOUNTER — Encounter: Payer: Self-pay | Admitting: Hematology and Oncology

## 2021-02-19 ENCOUNTER — Inpatient Hospital Stay: Payer: Medicare PPO | Admitting: Hematology and Oncology

## 2021-02-19 ENCOUNTER — Other Ambulatory Visit: Payer: Self-pay

## 2021-02-19 ENCOUNTER — Inpatient Hospital Stay: Payer: Medicare PPO | Attending: Hematology and Oncology

## 2021-02-19 VITALS — BP 184/77 | HR 55 | Temp 97.2°F | Resp 18 | Ht 68.0 in | Wt 178.2 lb

## 2021-02-19 DIAGNOSIS — I129 Hypertensive chronic kidney disease with stage 1 through stage 4 chronic kidney disease, or unspecified chronic kidney disease: Secondary | ICD-10-CM | POA: Diagnosis not present

## 2021-02-19 DIAGNOSIS — C8333 Diffuse large B-cell lymphoma, intra-abdominal lymph nodes: Secondary | ICD-10-CM

## 2021-02-19 DIAGNOSIS — Z87442 Personal history of urinary calculi: Secondary | ICD-10-CM | POA: Insufficient documentation

## 2021-02-19 DIAGNOSIS — N183 Chronic kidney disease, stage 3 unspecified: Secondary | ICD-10-CM | POA: Insufficient documentation

## 2021-02-19 DIAGNOSIS — E785 Hyperlipidemia, unspecified: Secondary | ICD-10-CM | POA: Insufficient documentation

## 2021-02-19 DIAGNOSIS — Z79899 Other long term (current) drug therapy: Secondary | ICD-10-CM | POA: Diagnosis not present

## 2021-02-19 DIAGNOSIS — C8338 Diffuse large B-cell lymphoma, lymph nodes of multiple sites: Secondary | ICD-10-CM | POA: Diagnosis not present

## 2021-02-19 DIAGNOSIS — Z95828 Presence of other vascular implants and grafts: Secondary | ICD-10-CM | POA: Diagnosis not present

## 2021-02-19 DIAGNOSIS — Z7982 Long term (current) use of aspirin: Secondary | ICD-10-CM | POA: Diagnosis not present

## 2021-02-19 DIAGNOSIS — Z8673 Personal history of transient ischemic attack (TIA), and cerebral infarction without residual deficits: Secondary | ICD-10-CM | POA: Insufficient documentation

## 2021-02-19 DIAGNOSIS — Z85820 Personal history of malignant melanoma of skin: Secondary | ICD-10-CM | POA: Diagnosis not present

## 2021-02-19 DIAGNOSIS — Z8616 Personal history of COVID-19: Secondary | ICD-10-CM | POA: Insufficient documentation

## 2021-02-19 DIAGNOSIS — K219 Gastro-esophageal reflux disease without esophagitis: Secondary | ICD-10-CM | POA: Insufficient documentation

## 2021-02-19 LAB — CBC WITH DIFFERENTIAL (CANCER CENTER ONLY)
Abs Immature Granulocytes: 0.04 10*3/uL (ref 0.00–0.07)
Basophils Absolute: 0 10*3/uL (ref 0.0–0.1)
Basophils Relative: 1 %
Eosinophils Absolute: 0.1 10*3/uL (ref 0.0–0.5)
Eosinophils Relative: 3 %
HCT: 40.7 % (ref 39.0–52.0)
Hemoglobin: 13.1 g/dL (ref 13.0–17.0)
Immature Granulocytes: 1 %
Lymphocytes Relative: 43 %
Lymphs Abs: 1.6 10*3/uL (ref 0.7–4.0)
MCH: 30.3 pg (ref 26.0–34.0)
MCHC: 32.2 g/dL (ref 30.0–36.0)
MCV: 94 fL (ref 80.0–100.0)
Monocytes Absolute: 0.4 10*3/uL (ref 0.1–1.0)
Monocytes Relative: 12 %
Neutro Abs: 1.5 10*3/uL — ABNORMAL LOW (ref 1.7–7.7)
Neutrophils Relative %: 40 %
Platelet Count: 170 10*3/uL (ref 150–400)
RBC: 4.33 MIL/uL (ref 4.22–5.81)
RDW: 14 % (ref 11.5–15.5)
WBC Count: 3.6 10*3/uL — ABNORMAL LOW (ref 4.0–10.5)
nRBC: 0 % (ref 0.0–0.2)

## 2021-02-19 LAB — CMP (CANCER CENTER ONLY)
ALT: 18 U/L (ref 0–44)
AST: 26 U/L (ref 15–41)
Albumin: 4.1 g/dL (ref 3.5–5.0)
Alkaline Phosphatase: 54 U/L (ref 38–126)
Anion gap: 10 (ref 5–15)
BUN: 23 mg/dL (ref 8–23)
CO2: 26 mmol/L (ref 22–32)
Calcium: 9.7 mg/dL (ref 8.9–10.3)
Chloride: 106 mmol/L (ref 98–111)
Creatinine: 1.23 mg/dL (ref 0.61–1.24)
GFR, Estimated: >60 mL/min (ref >60)
Glucose, Bld: 95 mg/dL (ref 70–99)
Potassium: 4.1 mmol/L (ref 3.5–5.1)
Sodium: 142 mmol/L (ref 135–145)
Total Bilirubin: 0.5 mg/dL (ref 0.3–1.2)
Total Protein: 7 g/dL (ref 6.5–8.1)

## 2021-02-19 LAB — LACTATE DEHYDROGENASE: LDH: 250 U/L — ABNORMAL HIGH (ref 98–192)

## 2021-02-19 MED ORDER — SODIUM CHLORIDE 0.9% FLUSH
10.0000 mL | Freq: Once | INTRAVENOUS | Status: AC
  Administered 2021-02-19: 10 mL via INTRAVENOUS
  Filled 2021-02-19: qty 10

## 2021-02-19 MED ORDER — HEPARIN SOD (PORK) LOCK FLUSH 100 UNIT/ML IV SOLN
500.0000 [IU] | Freq: Once | INTRAVENOUS | Status: AC
  Administered 2021-02-19: 500 [IU] via INTRAVENOUS
  Filled 2021-02-19: qty 5

## 2021-02-19 NOTE — Progress Notes (Signed)
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   1) 04/17/2020:  CT A/P showed dominant nodal mass (11.5 x 10.9 cm) within the jejunal mesentery, highly suspicious for lymphoma. Additionally there are left lower lobe pleural-based pulmonary nodules 2) 04/24/2020: establish care with Dr. Lorenso Courier 3) 04/28/2020:  PET CT scan shows Large solid left mesenteric mass 13.0 cm with maximum SUV of 21.2, [place] 5 with local hypermetabolic porta hepatis and retroperitoneal lymph nodes. Constitutes bulky Stage I disease.  4) 05/27/2020-05/31/2020: Cycle 1 of R-EPOCH 5) 06/16/2020-06/23/2020: Cycle 2 of R-EPOCH 6) 07/07/2020-07/11/2020: Cycle 3 of R-EPOCH  7) 07/28/2020-08/01/2020: Cycle 4 of R-EPOCH 8) 08/18/2020-08/22/2020: Cycle 5 of R-EPOCH 9) 09/08/2020-09/12/2020: Cycle 6 of R-EPOCH 10) 10/06/2020: PET CT scan shows decreased size of dominant mesenteric mass, however there was hypermetabolic activity within a single left anterior mesenteric lymph node  11) 12/31/2020: PET CT scan showed hypermetabolic nodular thickening the medial aspect of the RIGHT lower lobe is concerning for neoplasm versus atypical inflammatory response  Interval History:  Connor Adams 75 y.o. male with medical history significant for Stage I DLBCL who presents for a follow up visit. The patient was last seen on 01/01/2021 .  In the interim since he has had no major changes in his health.  On exam today Connor Adams reports that he is doing well overall.  He notes that his energy is quite good and that his appetite has been good as well.  He has been working quite actively and has had no limitations in his activity.  His weight is quite stable.  He is concerned about some lower extremity swelling which appears to worsen  throughout the day.  He notes that when he wakes up in the morning the swelling is quite low but it progressively worsens throughout the day.  He currently denies any shortness of breath or fluid accumulation elsewhere.  He is also concerned that his wife fell and broke her hip and is currently admitted to [facility].  He currently denies having issues with fevers, chills, sweats, nausea, vomiting or diarrhea.  A full 10 point ROS is listed below.  MEDICAL HISTORY:  Past Medical History:  Diagnosis Date  . Arthritis   . Chronic kidney disease (CKD), stage III (moderate) (HCC)   . COVID-19   . GERD (gastroesophageal reflux disease)    occ  . Hemorrhoids   . History of ETT 3/05   low risk  . History of hiatal hernia   . History of kidney stones   . HLD (hyperlipidemia)   . HTN (hypertension)   . Left inguinal hernia   . Melanoma ([place])    excied  . 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 Stroke [facility]) 2011   tia   . TIA (transient ischemic attack) 4/11   associated w slurred speecha ndmild facial droop. lasted for about 10 minutes. Had MRI, etc with [facility] that per his report was ok (dr. Stephannie Li). Echo (4/11): EF 55-60%, no regional WMAs, normal diastolic function, mild LAE, no source of embolus    SURGICAL HISTORY: Past Surgical History:  Procedure Laterality Date  . BMI 30.2 muscular  12/08/04  . curretage actinic keratosis R ear  01/20/05  . ETT low prob of CAD  11/21/03  . excision  melanoma in situ back  01/20/05  . hemorrhoids sclerosed at colonoscopy  12/19/05  . IR IMAGING GUIDED PORT INSERTION  05/19/2020  . LAPAROSCOPIC INGUINAL HERNIA REPAIR Left 12/20/1995  . LUMBAR DISC SURGERY    . NERVE REPAIR     back of neck  . retinal laser surgery Left 09/20/1986  . SEPTOPLASTY  11/19/98  . TOTAL KNEE ARTHROPLASTY Left 12/13/2016   Procedure: TOTAL KNEE ARTHROPLASTY WITH RIGHT KNEE CORTISONE INJECTION;  Surgeon: Frederik Pear, MD;  Location: [place];  Service:  Orthopedics;  Laterality: Left;  . uric acid 6.9  12/22/04  . x-ray l great toe  07/21/00    SOCIAL HISTORY: Social History   Socioeconomic History  . Marital status: Married    Spouse name: [name]  . Number of children: 2  . Years of education: 55  . Highest education level: Not on file  Occupational History  . Occupation: Systems developer: [employer]  Tobacco Use  . Smoking status: Never Smoker  . Smokeless tobacco: Never Used  Vaping Use  . Vaping Use: Never used  Substance and Sexual Activity  . Alcohol use: No  . Drug use: No  . Sexual activity: Yes  Other Topics Concern  . Not on file  Social History Narrative   Married to [employer] at [employer].   Son, [name] married   Son, [name], Married.    Caffeine use: 1 cup coffee per day   Social Determinants of Health   Financial Resource Strain: Not on file  Food Insecurity: Not on file  Transportation Needs: Not on file  Physical Activity: Not on file  Stress: Not on file  Social Connections: Not on file  Intimate Partner Violence: Not on file    FAMILY HISTORY: Family History  Problem Relation Age of Onset  . Liver cancer Sister   . Heart failure Brother 44       CABGx4  . Heart attack Mother 54  . Uterine cancer Mother   . Heart attack Father 65  . Asthma Son   . Diabetes Brother   . Prostate cancer Brother     ALLERGIES:  has No Known Allergies.  MEDICATIONS:  Current Outpatient Medications  Medication Sig Dispense Refill  . acetaminophen (TYLENOL) 325 MG tablet Take 2 tablets (650 mg total) by mouth every 6 (six) hours as needed for mild pain, moderate pain or fever.    Marland Kitchen amLODipine (NORVASC) 2.5 MG tablet Take 1 tablet (2.5 mg total) by mouth daily. 30 tablet 2  . aspirin EC 81 MG tablet Take 81 mg by mouth daily. Swallow whole.    . feeding supplement, ENSURE ENLIVE, (ENSURE ENLIVE) LIQD Take 237 mLs by mouth 2 (two) times daily between meals. 237 mL 12  .  fluticasone (FLONASE) 50 MCG/ACT nasal spray Place 1-2 sprays into both nostrils daily as needed for allergies or rhinitis.  2  . hydrocortisone (ANUSOL-HC) 2.5 % rectal cream Place 1 application rectally daily as needed. (Patient taking differently: Place 1 application rectally daily as needed for hemorrhoids or anal itching.) 30 g 0  . Multiple Vitamin (MULTI VITAMIN) TABS Take 1 tablet by mouth daily.    . mupirocin cream (BACTROBAN) 2 % Apply 1 application topically daily.    . polyethylene glycol (MIRALAX / GLYCOLAX) 17 g packet Take 17 g by mouth daily as needed for moderate constipation. 14 each 0  . polyvinyl alcohol (LIQUIFILM TEARS) 1.4 %  ophthalmic solution Place 1 drop into both eyes daily as needed for dry eyes.    . potassium chloride SA (KLOR-CON) 20 MEQ tablet Take 1 tablet (20 mEq total) by mouth daily. 14 tablet 0  . rosuvastatin (CRESTOR) 5 MG tablet Take 5 mg by mouth daily.    Marland Kitchen senna-docusate (SENOKOT-S) 8.6-50 MG tablet Take 2 tablets by mouth 2 (two) times daily as needed for mild constipation.     No current facility-administered medications for this visit.    REVIEW OF SYSTEMS:   Constitutional: ( - ) fevers, ( - )  chills , ( - ) night sweats Eyes: ( - ) blurriness of vision, ( - ) double vision, ( - ) watery eyes Ears, nose, mouth, throat, and face: ( - ) mucositis, ( - ) sore throat Respiratory: ( - ) cough, ( - ) dyspnea, ( - ) wheezes Cardiovascular: ( - ) palpitation, ( - ) chest discomfort, ( - ) lower extremity swelling Gastrointestinal:  ( - ) nausea, ( - ) heartburn, ( - ) change in bowel habits Skin: ( - ) abnormal skin rashes Lymphatics: ( - ) new lymphadenopathy, ( - ) easy bruising Neurological: ( - ) numbness, ( - ) tingling, ( - ) new weaknesses Behavioral/Psych: ( - ) mood change, ( - ) new changes  All other systems were reviewed with the patient and are negative.  PHYSICAL EXAMINATION: ECOG PERFORMANCE STATUS: 1 - Symptomatic but completely  ambulatory  Vitals:   02/19/21 0950  BP: (!) 184/77  Pulse: (!) 55  Resp: 18  Temp: (!) 97.2 F (36.2 C)  SpO2: 99%   Filed Weights   02/19/21 0950  Weight: 178 lb 3.2 oz (80.8 kg)    GENERAL: well appearing elderly Caucasian male. alert, no distress and comfortable SKIN: skin color, texture, turgor are normal, no rashes or significant lesions.  EYES: conjunctiva are pink and non-injected, sclera clear LUNGS: clear to auscultation and percussion with normal breathing effort HEART: regular rate & rhythm and no murmurs and trace lower extremity edema Musculoskeletal: no cyanosis of digits and no clubbing  PSYCH: alert & oriented x 3, fluent speech NEURO: no focal motor/sensory deficits  LABORATORY DATA:  I have reviewed the data as listed CBC Latest Ref Rng & Units 02/19/2021 01/01/2021 11/27/2020  WBC 4.0 - 10.5 K/uL 3.6(L) 4.1 9.7  Hemoglobin 13.0 - 17.0 g/dL 13.1 12.2(L) 11.4(L)  Hematocrit 39.0 - 52.0 % 40.7 37.7(L) 35.9(L)  Platelets 150 - 400 K/uL 170 176 213    CMP Latest Ref Rng & Units 02/19/2021 01/01/2021 11/27/2020  Glucose 70 - 99 mg/dL 95 110(H) 119(H)  BUN 8 - 23 mg/dL 23 24(H) 31(H)  Creatinine 0.61 - 1.24 mg/dL 1.23 1.19 1.15  Sodium 135 - 145 mmol/L 142 143 140  Potassium 3.5 - 5.1 mmol/L 4.1 4.0 4.0  Chloride 98 - 111 mmol/L 106 106 107  CO2 22 - 32 mmol/L 26 27 27   Calcium 8.9 - 10.3 mg/dL 9.7 9.1 9.2  Total Protein 6.5 - 8.1 g/dL 7.0 6.4(L) 6.4(L)  Total Bilirubin 0.3 - 1.2 mg/dL 0.5 0.4 0.4  Alkaline Phos 38 - 126 U/L 54 49 48  AST 15 - 41 U/L 26 30 25   ALT 0 - 44 U/L 18 26 40    RADIOGRAPHIC STUDIES: I have personally reviewed the radiological images as listed and agreed with the findings in the report: large abdominal mass markedly decreased in FDG avidity. Resolution of diffuse pulmonary nodules.  No results found.  ASSESSMENT & PLAN Connor Adams 75 y.o. male with medical history significant for Stage I DLBCL who presents for a follow up  visit.  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  At this time he has completed all planned chemotherapy treatment (R-EPOCH x 6 cycles).   On exam today Connor Adams is continuing to steadily improve.  His hemoglobin has risen to 13.1 and his energy levels are great.  He is gaining weight and is markedly active.  Most recent PET CT scan shows some mild FDG avidity in the abdomen with an area in the lungs which is of unclear etiology.  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  --findings consistent with a double hit DLBCL Treatment of choice is R-EPOCH chemotherapy in the inpatient setting. --he is currently s/p Cycle 6 administered on 09/08/2020 --patient has a port in place. TTE was performed and showed strong baseline heart function. --repeat evaluation PET CT scan shows good response to chemotherapy. Repeat PET CT scan  April 2022 shows no clear evidence of recurrence. Reviewed with [facility] who recommended serial monitoring.  --RTC in 6 weeks with repeat PET CT scan  #Lung Nodules, resolved -- noted on interim PET CT scan after Cycle 4.  --unclear if these represent areas of infection, metastatic disease, or progression of lymphoma --pulmonology consulted, appreciate their input.  --will monitor respiratory status closely --repeat PET CT in Jan 2022 showed resolution of the lesions   Orders Placed This Encounter  Procedures  . ECHOCARDIOGRAM COMPLETE    Patient underwent cardiotoxic chemotherapy, having some persistent leg swelling. Assess for CHF.    Standing Status:   Future    Standing Expiration Date:   02/19/2022    Order Specific Question:   Where should this test be performed     Answer:   [place]    Order Specific Question:   Perflutren DEFINITY (image enhancing agent) should be administered unless hypersensitivity or allergy exist    Answer:   Administer Perflutren    Order Specific Question:   Is a special reader required? (athlete or structural heart)    Answer:   No    Order Specific Question:   Does this study need to be read by the Structural team/Level 3 readers?    Answer:   No    Order Specific Question:   Reason for exam-Echo    Answer:   Chemo  Z09   All questions were answered. The patient knows to call the clinic with any problems, questions or concerns.  A total of more than 30 minutes were spent on this encounter and over half of that time was spent on counseling and coordination of care as outlined above.   [name], MD Department of Hematology/Oncology [place] at [facility] Phone: [phone] Pager: [phone] Email: [email or web address]  02/19/2021 11:22 AM

## 2021-02-24 ENCOUNTER — Telehealth: Payer: Self-pay | Admitting: Hematology and Oncology

## 2021-02-24 NOTE — Telephone Encounter (Signed)
Scheduled per los. Called and spoke with patient. Confirmed appt 

## 2021-02-26 DIAGNOSIS — Z85828 Personal history of other malignant neoplasm of skin: Secondary | ICD-10-CM | POA: Diagnosis not present

## 2021-02-26 DIAGNOSIS — L570 Actinic keratosis: Secondary | ICD-10-CM | POA: Diagnosis not present

## 2021-02-26 DIAGNOSIS — Z08 Encounter for follow-up examination after completed treatment for malignant neoplasm: Secondary | ICD-10-CM | POA: Diagnosis not present

## 2021-02-26 DIAGNOSIS — X32XXXD Exposure to sunlight, subsequent encounter: Secondary | ICD-10-CM | POA: Diagnosis not present

## 2021-03-05 ENCOUNTER — Ambulatory Visit (HOSPITAL_COMMUNITY)
Admission: RE | Admit: 2021-03-05 | Discharge: 2021-03-05 | Disposition: A | Discharge status: Home / Self Care | Payer: Medicare PPO | Source: Ambulatory Visit | Attending: Hematology and Oncology | Admitting: Hematology and Oncology

## 2021-03-05 ENCOUNTER — Other Ambulatory Visit: Payer: Self-pay

## 2021-03-05 DIAGNOSIS — I10 Essential (primary) hypertension: Secondary | ICD-10-CM | POA: Diagnosis not present

## 2021-03-05 DIAGNOSIS — Z8673 Personal history of transient ischemic attack (TIA), and cerebral infarction without residual deficits: Secondary | ICD-10-CM | POA: Diagnosis not present

## 2021-03-05 DIAGNOSIS — Z0189 Encounter for other specified special examinations: Secondary | ICD-10-CM | POA: Diagnosis not present

## 2021-03-05 DIAGNOSIS — C8333 Diffuse large B-cell lymphoma, intra-abdominal lymph nodes: Secondary | ICD-10-CM | POA: Insufficient documentation

## 2021-03-05 DIAGNOSIS — E785 Hyperlipidemia, unspecified: Secondary | ICD-10-CM | POA: Diagnosis not present

## 2021-03-05 DIAGNOSIS — Z01818 Encounter for other preprocedural examination: Secondary | ICD-10-CM | POA: Insufficient documentation

## 2021-03-05 NOTE — Progress Notes (Signed)
*  PRELIMINARY RESULTS* Echocardiogram 2D Echocardiogram has been performed.  Luisa Hart RDCS 03/05/2021, 9:51 AM

## 2021-03-06 LAB — ECHOCARDIOGRAM COMPLETE
AR max vel: 2.91 cm2
AV Area VTI: 2.72 cm2
AV Area mean vel: 2.73 cm2
AV Mean grad: 6 mmHg
AV Peak grad: 11.2 mmHg
Ao pk vel: 1.67 m/s
Area-P 1/2: 3.68 cm2
S' Lateral: 4.3 cm

## 2021-03-11 ENCOUNTER — Telehealth: Payer: Self-pay | Admitting: *Deleted

## 2021-03-11 NOTE — Telephone Encounter (Signed)
-----   Message from Orson Slick, MD sent at 03/11/2021 11:09 AM EDT ----- Please let Connor Adams know his echo showed he has a strong heart, it does not look like chemo had a negative effect on his heart function.  ----- Message ----- From: Interface, [facility] Sent: 03/06/2021   4:42 AM EDT To: Orson Slick, MD

## 2021-03-11 NOTE — Telephone Encounter (Signed)
TCT patient regarding recent cardiac echo. Spoke with pt's wife, [name]. Advised that [facility]'s echo was fine. No cardiac effects from his recent chemotherapy. [name] states he will be very pleased about these results.  No questions or concerns

## 2021-03-27 ENCOUNTER — Ambulatory Visit (HOSPITAL_COMMUNITY)
Admission: RE | Admit: 2021-03-27 | Discharge: 2021-03-27 | Disposition: A | Discharge status: Home / Self Care | Payer: Medicare PPO | Source: Ambulatory Visit | Attending: Hematology and Oncology | Admitting: Hematology and Oncology

## 2021-03-27 ENCOUNTER — Other Ambulatory Visit: Payer: Self-pay

## 2021-03-27 DIAGNOSIS — R911 Solitary pulmonary nodule: Secondary | ICD-10-CM | POA: Diagnosis not present

## 2021-03-27 DIAGNOSIS — C8333 Diffuse large B-cell lymphoma, intra-abdominal lymph nodes: Secondary | ICD-10-CM | POA: Diagnosis not present

## 2021-03-27 DIAGNOSIS — R918 Other nonspecific abnormal finding of lung field: Secondary | ICD-10-CM | POA: Diagnosis not present

## 2021-03-27 DIAGNOSIS — I700 Atherosclerosis of aorta: Secondary | ICD-10-CM | POA: Diagnosis not present

## 2021-03-27 DIAGNOSIS — I2510 Atherosclerotic heart disease of native coronary artery without angina pectoris: Secondary | ICD-10-CM | POA: Insufficient documentation

## 2021-03-27 LAB — GLUCOSE, CAPILLARY: Glucose-Capillary: 101 mg/dL — ABNORMAL HIGH (ref 70–99)

## 2021-03-27 MED ORDER — FLUDEOXYGLUCOSE F - 18 (FDG) INJECTION
8.9000 | Freq: Once | INTRAVENOUS | Status: AC
  Administered 2021-03-27: 8.93 via INTRAVENOUS

## 2021-04-02 ENCOUNTER — Inpatient Hospital Stay: Payer: Medicare PPO

## 2021-04-02 ENCOUNTER — Inpatient Hospital Stay: Payer: Medicare PPO | Attending: Hematology and Oncology | Admitting: Hematology and Oncology

## 2021-04-02 ENCOUNTER — Other Ambulatory Visit: Payer: Self-pay

## 2021-04-02 ENCOUNTER — Other Ambulatory Visit: Payer: Self-pay | Admitting: Hematology and Oncology

## 2021-04-02 VITALS — BP 137/89 | HR 64 | Temp 98.5°F | Resp 17 | Ht 68.0 in | Wt 180.0 lb

## 2021-04-02 DIAGNOSIS — N183 Chronic kidney disease, stage 3 unspecified: Secondary | ICD-10-CM | POA: Diagnosis not present

## 2021-04-02 DIAGNOSIS — Z8673 Personal history of transient ischemic attack (TIA), and cerebral infarction without residual deficits: Secondary | ICD-10-CM | POA: Diagnosis not present

## 2021-04-02 DIAGNOSIS — Z87442 Personal history of urinary calculi: Secondary | ICD-10-CM | POA: Insufficient documentation

## 2021-04-02 DIAGNOSIS — I129 Hypertensive chronic kidney disease with stage 1 through stage 4 chronic kidney disease, or unspecified chronic kidney disease: Secondary | ICD-10-CM | POA: Diagnosis not present

## 2021-04-02 DIAGNOSIS — E785 Hyperlipidemia, unspecified: Secondary | ICD-10-CM | POA: Insufficient documentation

## 2021-04-02 DIAGNOSIS — N200 Calculus of kidney: Secondary | ICD-10-CM | POA: Insufficient documentation

## 2021-04-02 DIAGNOSIS — K219 Gastro-esophageal reflux disease without esophagitis: Secondary | ICD-10-CM | POA: Insufficient documentation

## 2021-04-02 DIAGNOSIS — C8333 Diffuse large B-cell lymphoma, intra-abdominal lymph nodes: Secondary | ICD-10-CM

## 2021-04-02 DIAGNOSIS — Z79899 Other long term (current) drug therapy: Secondary | ICD-10-CM | POA: Diagnosis not present

## 2021-04-02 DIAGNOSIS — I2510 Atherosclerotic heart disease of native coronary artery without angina pectoris: Secondary | ICD-10-CM | POA: Insufficient documentation

## 2021-04-02 DIAGNOSIS — C8338 Diffuse large B-cell lymphoma, lymph nodes of multiple sites: Secondary | ICD-10-CM | POA: Insufficient documentation

## 2021-04-02 DIAGNOSIS — Z8616 Personal history of COVID-19: Secondary | ICD-10-CM | POA: Insufficient documentation

## 2021-04-02 DIAGNOSIS — Z85820 Personal history of malignant melanoma of skin: Secondary | ICD-10-CM | POA: Insufficient documentation

## 2021-04-02 DIAGNOSIS — I700 Atherosclerosis of aorta: Secondary | ICD-10-CM | POA: Insufficient documentation

## 2021-04-02 DIAGNOSIS — Z95828 Presence of other vascular implants and grafts: Secondary | ICD-10-CM | POA: Diagnosis not present

## 2021-04-02 LAB — CBC WITH DIFFERENTIAL (CANCER CENTER ONLY)
Abs Immature Granulocytes: 0.01 10*3/uL (ref 0.00–0.07)
Basophils Absolute: 0.1 10*3/uL (ref 0.0–0.1)
Basophils Relative: 1 %
Eosinophils Absolute: 0.1 10*3/uL (ref 0.0–0.5)
Eosinophils Relative: 2 %
HCT: 38.8 % — ABNORMAL LOW (ref 39.0–52.0)
Hemoglobin: 12.8 g/dL — ABNORMAL LOW (ref 13.0–17.0)
Immature Granulocytes: 0 %
Lymphocytes Relative: 19 %
Lymphs Abs: 1.3 10*3/uL (ref 0.7–4.0)
MCH: 30.7 pg (ref 26.0–34.0)
MCHC: 33 g/dL (ref 30.0–36.0)
MCV: 93 fL (ref 80.0–100.0)
Monocytes Absolute: 0.5 10*3/uL (ref 0.1–1.0)
Monocytes Relative: 7 %
Neutro Abs: 4.7 10*3/uL (ref 1.7–7.7)
Neutrophils Relative %: 71 %
Platelet Count: 185 10*3/uL (ref 150–400)
RBC: 4.17 MIL/uL — ABNORMAL LOW (ref 4.22–5.81)
RDW: 14.3 % (ref 11.5–15.5)
WBC Count: 6.7 10*3/uL (ref 4.0–10.5)
nRBC: 0 % (ref 0.0–0.2)

## 2021-04-02 LAB — CMP (CANCER CENTER ONLY)
ALT: 20 U/L (ref 0–44)
AST: 22 U/L (ref 15–41)
Albumin: 4 g/dL (ref 3.5–5.0)
Alkaline Phosphatase: 57 U/L (ref 38–126)
Anion gap: 8 (ref 5–15)
BUN: 24 mg/dL — ABNORMAL HIGH (ref 8–23)
CO2: 29 mmol/L (ref 22–32)
Calcium: 9.5 mg/dL (ref 8.9–10.3)
Chloride: 105 mmol/L (ref 98–111)
Creatinine: 1.37 mg/dL — ABNORMAL HIGH (ref 0.61–1.24)
GFR, Estimated: 54 mL/min — ABNORMAL LOW (ref >60)
Glucose, Bld: 98 mg/dL (ref 70–99)
Potassium: 4.2 mmol/L (ref 3.5–5.1)
Sodium: 142 mmol/L (ref 135–145)
Total Bilirubin: 0.4 mg/dL (ref 0.3–1.2)
Total Protein: 6.9 g/dL (ref 6.5–8.1)

## 2021-04-02 LAB — LACTATE DEHYDROGENASE: LDH: 190 U/L (ref 98–192)

## 2021-04-02 MED ORDER — SODIUM CHLORIDE 0.9% FLUSH
10.0000 mL | Freq: Once | INTRAVENOUS | Status: AC
  Administered 2021-04-02: 10 mL via INTRAVENOUS
  Filled 2021-04-02: qty 10

## 2021-04-02 MED ORDER — HEPARIN SOD (PORK) LOCK FLUSH 100 UNIT/ML IV SOLN
500.0000 [IU] | Freq: Once | INTRAVENOUS | Status: AC
  Administered 2021-04-02: 500 [IU] via INTRAVENOUS
  Filled 2021-04-02: qty 5

## 2021-04-09 ENCOUNTER — Telehealth (INDEPENDENT_AMBULATORY_CARE_PROVIDER_SITE_OTHER): Payer: Medicare PPO | Admitting: Pulmonary Disease

## 2021-04-09 ENCOUNTER — Telehealth: Payer: Self-pay | Admitting: Pulmonary Disease

## 2021-04-09 ENCOUNTER — Telehealth: Payer: Self-pay | Admitting: *Deleted

## 2021-04-09 DIAGNOSIS — R590 Localized enlarged lymph nodes: Secondary | ICD-10-CM | POA: Diagnosis not present

## 2021-04-09 NOTE — Telephone Encounter (Signed)
TCT patient regarding plans for his disease progression. Spoke with him. Advised that the doctors @ [facility] Heme/Onco have agreed to assist in his care but the next thing we need to do is get another biopsy, Pt states that Dr. Tretha Sciara office called him yesterday and will be scheduling him for the biopsy one day next week.  Pt is in agreement for this and and is pleased with involving Dr. Cassell Clement @ [facility].  Otherwise he is doing well. No c/o

## 2021-04-09 NOTE — Progress Notes (Signed)
Virtual Visit via Telephone Note  I connected with Connor Adams on 04/09/21 at  by telephone and verified that I am speaking with the correct person using two identifiers.  Location: Patient: Home Provider: Home office   I discussed the limitations, risks, security and privacy concerns of performing an evaluation and management service by telephone and the availability of in person appointments. I also discussed with the patient that there may be a patient responsible charge related to this service. The patient expressed understanding and agreed to proceed.   History of Present Illness: 75 year old male with history of stage I DLBCL s/p chemotherapy. PET CT completed on 03/27/21 demonstrated interval progression of malignancy including new areas of FDG uptake in the mediastinal and hilar lymph nodes bilaterally. He denies any respiratory complaints except very rare wheezing due to history of asthma when triggered by allergens. Does not routinely use or need bronchodilators. At this time denies shortness of breath, wheeze or cough or recent URI. Per his wife, he is active and has been working in the garden this morning.   Observations/Objective: No respiratory distress. No audible wheezing.  PET CT 03/27/21 - interval progression of malignancy including new areas of FDG uptake in the mediastinal and hilar lymph nodes bilaterally  Assessment and Plan: Stage I Diffuse Large B Cell Lymphoma s/p R-EPOCH x 6 cycles Intermittent asthma  His oncologist reached out to me regarding patient's recent PET/CT concerning for recurrent lymphoma and to discuss evaluation with bronchoscopy. I reviewed imaging and determined lesions are amenable to bronchoscopy sampling.  The patient and I discussed diagnostic testing including CT-guided biopsy, bronchoscopy vs conservative management with further imaging. After addressing patient/family's questions, patient wishes to pursue diagnostic testing via  bronchoscopy. We discussed risks and benefits of procedure including infection, bleeding and lung collapse. Patient consented to procedure. Will coordinate procedure with RN and endoscopy scheduler for next week   Follow Up Instructions:    I discussed the assessment and treatment plan with the patient. The patient was provided an opportunity to ask questions and all were answered. The patient agreed with the plan and demonstrated an understanding of the instructions.   The patient was advised to call back or seek an in-person evaluation if the symptoms worsen or if the condition fails to improve as anticipated.  I provided 30 minutes of non-face-to-face time during this encounter.   Connor Lavell Rodman Pickle, MD

## 2021-04-09 NOTE — Telephone Encounter (Signed)
Pt informed of the following:  Covid test 7/25 at 12:00pm  Bronch 7/27 at 8:45am; 6:15am arrival time @ [facility]

## 2021-04-12 ENCOUNTER — Encounter: Payer: Self-pay | Admitting: Hematology and Oncology

## 2021-04-12 NOTE — Progress Notes (Signed)
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   1) 04/17/2020:  CT A/P showed dominant nodal mass (11.5 x 10.9 cm) within the jejunal mesentery, highly suspicious for lymphoma. Additionally there are left lower lobe pleural-based pulmonary nodules 2) 04/24/2020: establish care with Dr. Lorenso Courier 3) 04/28/2020:  PET CT scan shows Large solid left mesenteric mass 13.0 cm with maximum SUV of 21.2, Deauville 5 with local hypermetabolic porta hepatis and retroperitoneal lymph nodes. Constitutes bulky Stage I disease.  4) 05/27/2020-05/31/2020: Cycle 1 of R-EPOCH 5) 06/16/2020-06/23/2020: Cycle 2 of R-EPOCH 6) 07/07/2020-07/11/2020: Cycle 3 of R-EPOCH  7) 07/28/2020-08/01/2020: Cycle 4 of R-EPOCH 8) 08/18/2020-08/22/2020: Cycle 5 of R-EPOCH 9) 09/08/2020-09/12/2020: Cycle 6 of R-EPOCH 10) 10/06/2020: PET CT scan shows decreased size of dominant mesenteric mass, however there was hypermetabolic activity within a single left anterior mesenteric lymph node  11) 12/31/2020: PET CT scan showed hypermetabolic nodular thickening the medial aspect of the RIGHT lower lobe is concerning for neoplasm versus atypical inflammatory response 12) 03/27/2021: PET CT Scan showed progression of disease compared with 12/30/2020. Multiple mediastinal and hilar lymph nodes exhibit new FDG uptake compatible with Deauville criteria 5.  Interval History:  Connor Adams 75 y.o. male with medical history significant for Stage I DLBCL who presents for a follow up visit. The patient was last seen on 02/19/2021 .  In the interim since he has had no major changes in his health.  On exam today Connor Adams reports he continues to have enormous amounts of energy and enthusiasm.  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  His appetite is been good and his weight has been stable.  He is virtually asymptomatic on exam today.  He currently denies having issues with fevers, chills, sweats, nausea, vomiting or diarrhea.  A full 10 point ROS is listed below.  MEDICAL HISTORY:  Past Medical History:  Diagnosis Date   Arthritis    Chronic kidney disease (CKD), stage III (moderate) (HCC)    COVID-19    GERD (gastroesophageal reflux disease)    occ   Hemorrhoids    History of ETT 3/05   low risk   History of hiatal hernia    History of kidney stones    HLD (hyperlipidemia)    HTN (hypertension)    Left inguinal hernia    Melanoma ([place])    excied   Sciatica    from lumbar disc disease   Stroke ([place]) 2011   tia    TIA (transient ischemic attack) 4/11   associated w slurred speecha ndmild facial droop. lasted for about 10 minutes. Had MRI, etc with [facility] that per his report was ok (dr. Stephannie Li). Echo (4/11): EF 55-60%, no regional WMAs, normal diastolic function, mild LAE, no source of embolus    SURGICAL HISTORY: Past Surgical History:  Procedure Laterality Date   BMI 30.2 muscular  12/08/04   curretage actinic keratosis R ear  01/20/05   ETT low prob of CAD  11/21/03   excision melanoma in situ back  01/20/05   hemorrhoids sclerosed at colonoscopy  12/19/05   IR IMAGING GUIDED PORT INSERTION  05/19/2020   LAPAROSCOPIC INGUINAL HERNIA REPAIR Left 12/20/1995   LUMBAR [place]  NERVE REPAIR     back of neck   retinal laser surgery Left 09/20/1986   SEPTOPLASTY  11/19/98   TOTAL KNEE ARTHROPLASTY Left 12/13/2016   Procedure: TOTAL KNEE ARTHROPLASTY WITH RIGHT KNEE CORTISONE INJECTION;  Surgeon: Frederik Pear, MD;  Location: [facility];  Service: Orthopedics;  Laterality: Left;   uric acid 6.9  12/22/04   x-ray l great toe  07/21/00    SOCIAL HISTORY: Social History   Socioeconomic History   Marital status: Married     Spouse name: [name]   Number of children: 2   Years of education: 12   Highest education level: Not on file  Occupational History   Occupation: Systems developer: [employer]  Tobacco Use   Smoking status: Never   Smokeless tobacco: Never  Vaping Use   Vaping Use: Never used  Substance and Sexual Activity   Alcohol use: No   Drug use: No   Sexual activity: Yes  Other Topics Concern   Not on file  Social History Narrative   Married to [employer] at [employer].   Son, [name] married   Son, [name], Married.    Caffeine use: 1 cup coffee per day   Social Determinants of Health   Financial Resource Strain: Not on file  Food Insecurity: Not on file  Transportation Needs: Not on file  Physical Activity: Not on file  Stress: Not on file  Social Connections: Not on file  Intimate Partner Violence: Not on file    FAMILY HISTORY: Family History  Problem Relation Age of Onset   Liver cancer Sister    Heart failure Brother 9       CABGx4   Heart attack Mother 19   Uterine cancer Mother    Heart attack Father 62   Asthma Son    Diabetes Brother    Prostate cancer Brother     ALLERGIES:  has No Known Allergies.  MEDICATIONS:  Current Outpatient Medications  Medication Sig Dispense Refill   acetaminophen (TYLENOL) 325 MG tablet Take 2 tablets (650 mg total) by mouth every 6 (six) hours as needed for mild pain, moderate pain or fever.     albuterol (VENTOLIN HFA) 108 (90 Base) MCG/ACT inhaler Inhale 1-2 puffs into the lungs every 6 (six) hours as needed for wheezing or shortness of breath.     amLODipine (NORVASC) 2.5 MG tablet Take 1 tablet (2.5 mg total) by mouth daily. (Patient not taking: No sig reported) 30 tablet 2   amLODipine (NORVASC) 5 MG tablet Take 5 mg by mouth in the morning.     aspirin EC 81 MG tablet Take 81 mg by mouth in the morning. Swallow whole.     feeding supplement, ENSURE ENLIVE, (ENSURE ENLIVE) LIQD Take 237 mLs by  mouth 2 (two) times daily between meals. (Patient not taking: Reported on 04/10/2021) 237 mL 12   fluticasone (FLONASE) 50 MCG/ACT nasal spray Place 1-2 sprays into both nostrils daily as needed for allergies or rhinitis.  2   hydrocortisone (ANUSOL-HC) 2.5 % rectal cream Place 1 application rectally daily as needed. (Patient taking differently: Place 1 application rectally daily as needed for hemorrhoids or anal itching.) 30 g 0   indomethacin (INDOCIN) 50 MG capsule Take 50 mg by mouth 3 (three) times daily as needed (GOUT).     Multiple Vitamin (MULTIVITAMIN WITH MINERALS) TABS tablet Take 1 tablet by mouth in the morning.  Omega-3 Fatty Acids (FISH OIL) 600 MG CAPS Take 600 mg by mouth in the morning.     polyvinyl alcohol (LIQUIFILM TEARS) 1.4 % ophthalmic solution Place 1 drop into both eyes in the morning.     potassium chloride SA (KLOR-CON) 20 MEQ tablet Take 1 tablet (20 mEq total) by mouth daily. (Patient not taking: No sig reported) 14 tablet 0   rosuvastatin (CRESTOR) 5 MG tablet Take 5 mg by mouth in the morning.     No current facility-administered medications for this visit.    REVIEW OF SYSTEMS:   Constitutional: ( - ) fevers, ( - )  chills , ( - ) night sweats Eyes: ( - ) blurriness of vision, ( - ) double vision, ( - ) watery eyes Ears, nose, mouth, throat, and face: ( - ) mucositis, ( - ) sore throat Respiratory: ( - ) cough, ( - ) dyspnea, ( - ) wheezes Cardiovascular: ( - ) palpitation, ( - ) chest discomfort, ( - ) lower extremity swelling Gastrointestinal:  ( - ) nausea, ( - ) heartburn, ( - ) change in bowel habits Skin: ( - ) abnormal skin rashes Lymphatics: ( - ) new lymphadenopathy, ( - ) easy bruising Neurological: ( - ) numbness, ( - ) tingling, ( - ) new weaknesses Behavioral/Psych: ( - ) mood change, ( - ) new changes  All other systems were reviewed with the patient and are negative.  PHYSICAL EXAMINATION: ECOG PERFORMANCE STATUS: 1 - Symptomatic but  completely ambulatory  Vitals:   04/02/21 1446  BP: 137/89  Pulse: 64  Resp: 17  Temp: 98.5 F (36.9 C)  SpO2: 98%   Filed Weights   04/02/21 1446  Weight: 180 lb (81.6 kg)    GENERAL: well appearing elderly Caucasian male. alert, no distress and comfortable SKIN: skin color, texture, turgor are normal, no rashes or significant lesions.  EYES: conjunctiva are pink and non-injected, sclera clear LUNGS: clear to auscultation and percussion with normal breathing effort HEART: regular rate & rhythm and no murmurs and trace lower extremity edema Musculoskeletal: no cyanosis of digits and no clubbing  PSYCH: alert & oriented x 3, fluent speech NEURO: no focal motor/sensory deficits  LABORATORY DATA:  I have reviewed the data as listed CBC Latest Ref Rng & Units 04/02/2021 02/19/2021 01/01/2021  WBC 4.0 - 10.5 K/uL 6.7 3.6(L) 4.1  Hemoglobin 13.0 - 17.0 g/dL 12.8(L) 13.1 12.2(L)  Hematocrit 39.0 - 52.0 % 38.8(L) 40.7 37.7(L)  Platelets 150 - 400 K/uL 185 170 176    CMP Latest Ref Rng & Units 04/02/2021 02/19/2021 01/01/2021  Glucose 70 - 99 mg/dL 98 95 110(H)  BUN 8 - 23 mg/dL 24(H) 23 24(H)  Creatinine 0.61 - 1.24 mg/dL 1.37(H) 1.23 1.19  Sodium 135 - 145 mmol/L 142 142 143  Potassium 3.5 - 5.1 mmol/L 4.2 4.1 4.0  Chloride 98 - 111 mmol/L 105 106 106  CO2 22 - 32 mmol/L '29 26 27  '$ Calcium 8.9 - 10.3 mg/dL 9.5 9.7 9.1  Total Protein 6.5 - 8.1 g/dL 6.9 7.0 6.4(L)  Total Bilirubin 0.3 - 1.2 mg/dL 0.4 0.5 0.4  Alkaline Phos 38 - 126 U/L 57 54 49  AST 15 - 41 U/L '22 26 30  '$ ALT 0 - 44 U/L '20 18 26    '$ RADIOGRAPHIC STUDIES: I have personally reviewed the radiological images as listed and agreed with the findings in the report: large abdominal mass markedly decreased in FDG avidity.  Increased  brightness of lymph nodes in the chest, concerning for progression.  NM PET Image Restag (PS) Skull Base To Thigh  Result Date: 03/27/2021 CLINICAL DATA:  Subsequent treatment strategy for diffuse  large B-cell lymphoma. EXAM: NUCLEAR MEDICINE PET SKULL BASE TO THIGH TECHNIQUE: 8.9 mCi F-18 FDG was injected intravenously. Full-ring PET imaging was performed from the skull base to thigh after the radiotracer. CT data was obtained and used for attenuation correction and anatomic localization. Fasting blood glucose: 101 mg/dl COMPARISON:  12/30/2020 FINDINGS: Mediastinal blood pool activity: SUV max 2.35 Liver activity: SUV max 3.29 NECK: No hypermetabolic lymph nodes in the neck. Incidental CT findings: none CHEST: Interval increase in FDG uptake within mediastinal and hilar lymph nodes compared with previous exam. -index the left paratracheal lymph node has an SUV max of 5.76. New since previous exam -index right hilar lymph node has an SUV max of 5.92. Also new in the interval. -index subcarinal lymph node has an SUV max of 5.06. New from previous exam. FDG avid paravertebral nodule within the medial right lower lobe measures 1.7 cm and has an SUV max of 4.14. Previously this measured 1.7 cm within SUV max of 3.98. The other paravertebral nodule within the medial right base measures 2.1 cm and has an SUV max 4.39. Previously this measured 2.1 cm within SUV max of 3.98. A third nodule within the medial right base measures 1.8 cm within SUV max of 2.36. Previously this measured 1.6 cm within SUV max of 3.73. Incidental CT findings: Aortic atherosclerosis. Coronary artery calcifications. ABDOMEN/PELVIS: Soft tissue infiltration within the jejunal mesentery is again noted within the left abdomen. Within this area there is an FDG avid lymph node measuring 1.1 x 2.3 cm within SUV max of 5.72, image 145/4. Previously this measured 2.4 x 1.0 cm within SUV max of 3.54. Incidental CT findings: Bilateral kidney stones are again noted. Exophytic lesion off the anterior cortex of the inferior pole of right kidney measures 1.2 cm without increased FDG uptake. Aortic atherosclerosis. Calcified granulomas within the liver and  spleen. SKELETON: No focal hypermetabolic activity to suggest skeletal metastasis. Incidental CT findings: Anterolisthesis of L5 on S1 is identified measuring 1.5 cm. Bilateral L5 pars defects are identified and there is marked L5-S1 degenerative disc disease. IMPRESSION: 1. Interval progression of disease compared with 12/30/2020. 2. Multiple mediastinal and hilar lymph nodes exhibit new FDG uptake compatible with Deauville criteria 5. 3. Persistent FDG avid nodules within the medial right lower lobe. When compared with the previous exam 2 of these nodules exhibit progressive FDG uptake now consistent with Deauville criteria 4. 4. Progressive FDG uptake associated with previous index lymph node. This is compatible with Deauville criteria 4. Electronically Signed   By: Kerby Moors M.D.   On: 03/27/2021 16:24    ASSESSMENT & PLAN ELDRED BOETTCHER 75 y.o. male with medical history significant for Stage I DLBCL who presents for a follow up visit.  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  At this time he has completed all planned chemotherapy treatment (R-EPOCH x 6 cycles).   On exam today Connor Adams continues to have good levels of energy with no symptoms.  Unfortunately his most recent PET CT scan on 03/27/2021 showed concern for progression.  We are currently planning to have a bronchoscopy performed to biopsy  1 of these lymph nodes to confirm recurrence.  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  --findings consistent with a double hit DLBCL Treatment of choice is R-EPOCH chemotherapy in the inpatient setting. --he is currently s/p Cycle 6 administered on 09/08/2020 --patient has a port in  place. TTE was performed and showed strong baseline heart function. --PET CT scan on 04/16/2021 showed concern for progressive disease in the chest. --will pursue biopsy via bronchoscopy to confirm this is recurrence of his DLBCL --case discussed with Dr. Cassell Clement of [facility]. She recommendations after biopsy confirmation for consideration of rituximab/polatuzumab. She will schedule an appointment to see if CAR-T therapy may be an option. -- Return to clinic following the results of the pulmonary biopsy for consideration of starting rituximab polatuzumab  #Lung Nodules, resolved -- noted on interim PET CT scan after Cycle 4.  --unclear if these represent areas of infection, metastatic disease, or progression of lymphoma --pulmonology consulted, appreciate their input.  --will monitor respiratory status closely   No orders of the defined types were placed in this encounter.  All questions were answered. The patient knows to call the clinic with any problems, questions or concerns.  A total of more than 30 minutes were spent on this encounter and over half of that time was spent on counseling and coordination of care as outlined above.   [name], MD Department of Hematology/Oncology [place] at [facility] Phone: [phone] Pager: [phone] Email: [email or web address]  04/12/2021 4:57 PM

## 2021-04-13 ENCOUNTER — Other Ambulatory Visit (HOSPITAL_COMMUNITY)
Admission: RE | Admit: 2021-04-13 | Discharge: 2021-04-13 | Disposition: A | Discharge status: Home / Self Care | Payer: Medicare PPO | Source: Ambulatory Visit | Attending: Obstetrics and Gynecology | Admitting: Obstetrics and Gynecology

## 2021-04-13 DIAGNOSIS — Z01812 Encounter for preprocedural laboratory examination: Secondary | ICD-10-CM | POA: Diagnosis not present

## 2021-04-13 DIAGNOSIS — Z20822 Contact with and (suspected) exposure to covid-19: Secondary | ICD-10-CM | POA: Diagnosis not present

## 2021-04-13 LAB — SARS CORONAVIRUS 2 (TAT 6-24 HRS): SARS Coronavirus 2: NEGATIVE

## 2021-04-14 ENCOUNTER — Telehealth: Payer: Self-pay | Admitting: Pulmonary Disease

## 2021-04-14 NOTE — Telephone Encounter (Signed)
ATC wife Connor Adams, [place] When she calls back we need to confirm that there is someone coming with the patient for procedure.

## 2021-04-14 NOTE — Telephone Encounter (Signed)
Spoke with wife [name] who is asking if JE will call her after procedure tomorrow just to let her know how he is doing. She states that she fell and broke her femur so their grandson Gwyndolyn Saxon will be bringing patient to procedure. You can call her at [phone] or [phone].  Routing to Dr. Loanne Drilling

## 2021-04-15 ENCOUNTER — Other Ambulatory Visit: Payer: Self-pay

## 2021-04-15 ENCOUNTER — Ambulatory Visit (HOSPITAL_COMMUNITY): Payer: Medicare PPO | Admitting: Anesthesiology

## 2021-04-15 ENCOUNTER — Ambulatory Visit (HOSPITAL_COMMUNITY)
Admission: RE | Admit: 2021-04-15 | Discharge: 2021-04-15 | Disposition: A | Discharge status: Home / Self Care | Payer: Medicare PPO | Attending: Pulmonary Disease | Admitting: Pulmonary Disease

## 2021-04-15 ENCOUNTER — Encounter (HOSPITAL_COMMUNITY)
Admission: RE | Disposition: A | Discharge status: Home / Self Care | Payer: Self-pay | Source: Home / Self Care | Attending: Pulmonary Disease

## 2021-04-15 ENCOUNTER — Encounter (HOSPITAL_COMMUNITY): Payer: Self-pay | Admitting: Pulmonary Disease

## 2021-04-15 DIAGNOSIS — Z79899 Other long term (current) drug therapy: Secondary | ICD-10-CM | POA: Insufficient documentation

## 2021-04-15 DIAGNOSIS — Z8616 Personal history of COVID-19: Secondary | ICD-10-CM | POA: Diagnosis not present

## 2021-04-15 DIAGNOSIS — I129 Hypertensive chronic kidney disease with stage 1 through stage 4 chronic kidney disease, or unspecified chronic kidney disease: Secondary | ICD-10-CM | POA: Diagnosis not present

## 2021-04-15 DIAGNOSIS — Z96652 Presence of left artificial knee joint: Secondary | ICD-10-CM | POA: Diagnosis not present

## 2021-04-15 DIAGNOSIS — Z85820 Personal history of malignant melanoma of skin: Secondary | ICD-10-CM | POA: Insufficient documentation

## 2021-04-15 DIAGNOSIS — E785 Hyperlipidemia, unspecified: Secondary | ICD-10-CM | POA: Diagnosis not present

## 2021-04-15 DIAGNOSIS — Z9221 Personal history of antineoplastic chemotherapy: Secondary | ICD-10-CM | POA: Diagnosis not present

## 2021-04-15 DIAGNOSIS — I898 Other specified noninfective disorders of lymphatic vessels and lymph nodes: Secondary | ICD-10-CM | POA: Diagnosis not present

## 2021-04-15 DIAGNOSIS — R590 Localized enlarged lymph nodes: Secondary | ICD-10-CM

## 2021-04-15 DIAGNOSIS — N183 Chronic kidney disease, stage 3 unspecified: Secondary | ICD-10-CM | POA: Diagnosis not present

## 2021-04-15 HISTORY — PX: ENDOBRONCHIAL ULTRASOUND: SHX5096

## 2021-04-15 HISTORY — PX: BRONCHIAL NEEDLE ASPIRATION BIOPSY: SHX5106

## 2021-04-15 HISTORY — PX: BRONCHIAL BRUSHINGS: SHX5108

## 2021-04-15 SURGERY — ENDOBRONCHIAL ULTRASOUND (EBUS)
Anesthesia: Monitor Anesthesia Care

## 2021-04-15 MED ORDER — FENTANYL CITRATE (PF) 100 MCG/2ML IJ SOLN: 25.0000 ug | INTRAMUSCULAR | Status: DC | PRN

## 2021-04-15 MED ORDER — EPHEDRINE SULFATE-NACL 50-0.9 MG/10ML-% IV SOSY
PREFILLED_SYRINGE | INTRAVENOUS | Status: DC | PRN
  Administered 2021-04-15 (×5): 10 mg via INTRAVENOUS

## 2021-04-15 MED ORDER — ONDANSETRON HCL 4 MG/2ML IJ SOLN
INTRAMUSCULAR | Status: DC | PRN
  Administered 2021-04-15: 4 mg via INTRAVENOUS

## 2021-04-15 MED ORDER — PROPOFOL 10 MG/ML IV BOLUS
INTRAVENOUS | Status: DC | PRN
  Administered 2021-04-15: 50 mg via INTRAVENOUS
  Administered 2021-04-15: 150 mg via INTRAVENOUS

## 2021-04-15 MED ORDER — FENTANYL CITRATE (PF) 100 MCG/2ML IJ SOLN
INTRAMUSCULAR | Status: DC | PRN
  Administered 2021-04-15: 100 ug via INTRAVENOUS

## 2021-04-15 MED ORDER — OXYCODONE HCL 5 MG PO TABS: 5.0000 mg | ORAL_TABLET | Freq: Once | ORAL | Status: DC | PRN

## 2021-04-15 MED ORDER — SUGAMMADEX SODIUM 200 MG/2ML IV SOLN
INTRAVENOUS | Status: DC | PRN
  Administered 2021-04-15: 100 mg via INTRAVENOUS

## 2021-04-15 MED ORDER — DEXAMETHASONE SODIUM PHOSPHATE 10 MG/ML IJ SOLN
INTRAMUSCULAR | Status: DC | PRN
  Administered 2021-04-15: 5 mg via INTRAVENOUS

## 2021-04-15 MED ORDER — PHENYLEPHRINE 40 MCG/ML (10ML) SYRINGE FOR IV PUSH (FOR BLOOD PRESSURE SUPPORT)
PREFILLED_SYRINGE | INTRAVENOUS | Status: DC | PRN
  Administered 2021-04-15 (×4): 80 ug via INTRAVENOUS

## 2021-04-15 MED ORDER — LIDOCAINE 2% (20 MG/ML) 5 ML SYRINGE
INTRAMUSCULAR | Status: DC | PRN
  Administered 2021-04-15: 80 mg via INTRAVENOUS

## 2021-04-15 MED ORDER — ROCURONIUM BROMIDE 10 MG/ML (PF) SYRINGE
PREFILLED_SYRINGE | INTRAVENOUS | Status: DC | PRN
  Administered 2021-04-15: 40 mg via INTRAVENOUS

## 2021-04-15 MED ORDER — PROMETHAZINE HCL 25 MG/ML IJ SOLN: 6.2500 mg | INTRAMUSCULAR | Status: DC | PRN

## 2021-04-15 MED ORDER — MIDAZOLAM HCL 2 MG/2ML IJ SOLN
INTRAMUSCULAR | Status: DC | PRN
  Administered 2021-04-15: 2 mg via INTRAVENOUS

## 2021-04-15 MED ORDER — OXYCODONE HCL 5 MG/5ML PO SOLN: 5.0000 mg | Freq: Once | ORAL | Status: DC | PRN

## 2021-04-15 MED ORDER — CHLORHEXIDINE GLUCONATE 0.12 % MT SOLN
OROMUCOSAL | Status: AC
  Administered 2021-04-15: 15 mL
  Filled 2021-04-15: qty 15

## 2021-04-15 MED ORDER — LACTATED RINGERS IV SOLN: INTRAVENOUS | Status: DC

## 2021-04-15 MED ORDER — SUCCINYLCHOLINE CHLORIDE 200 MG/10ML IV SOSY
PREFILLED_SYRINGE | INTRAVENOUS | Status: DC | PRN
  Administered 2021-04-15: 140 mg via INTRAVENOUS

## 2021-04-15 NOTE — Op Note (Signed)
[facility] Cardiopulmonary Patient Name: Connor Adams Date: 04/15/2021 MRN: [id number] Attending MD: Rodman Pickle , MD Date of Birth: 08/13/1946 CSN: Finalized Age: 75 Admit Type: Inpatient Gender: Male Procedure:             Bronchoscopy Indications:           Bilateral hilar lymphadenopathy, Mediastinal adenopathy Providers:             Rodman Pickle, MD, Terrall Laity, Doristine Johns,                         RN, Benetta Spar, Technician Referring MD:           Medicines:             General Anesthesia Complications:         No immediate complications Estimated Blood Loss:  Estimated blood loss was minimal. Procedure:             Pre-Anesthesia Assessment:                        - A History and Physical has been performed. Patient                         meds and allergies have been reviewed. The risks and                         benefits of the procedure and the sedation options and                         risks were discussed with the patient. All questions                         were answered and informed consent was obtained.                         Patient identification and proposed procedure were                         verified prior to the procedure by the physician in                         the pre-procedure area. Mental Status Examination:                         alert and oriented. Airway Examination: normal                         oropharyngeal airway. Respiratory Examination: clear                         to auscultation. CV Examination: RRR, no murmurs, no                         S3 or S4. ASA Grade Assessment: I - A normal healthy                         patient. After reviewing the risks and benefits, the  patient was deemed in satisfactory condition to                         undergo the procedure. The anesthesia plan was to use                         general anesthesia. Immediately prior to                          administration of medications, the patient was                         re-assessed for adequacy to receive sedatives. The                         heart rate, respiratory rate, oxygen saturations,                         blood pressure, adequacy of pulmonary ventilation, and                         response to care were monitored throughout the                         procedure. The physical status of the patient was                         re-assessed after the procedure.                        After obtaining informed consent, the bronchoscope was                         passed under direct vision. Throughout the procedure,                         the patient's blood pressure, pulse, and oxygen                         saturations were monitored continuously. the [id number]                         [id number]) Olympus bronchoscope was introduced through                         the mouth, via the endotracheal tube (the patient was                         intubated for the procedure) and advanced to the                         tracheobronchial tree. The procedure was accomplished                         without difficulty. The patient tolerated the                         procedure well. Scope In: Scope Out: Findings:      The endotracheal tube is  in good position. The visualized portion of the       trachea is of normal caliber. The carina is sharp. The tracheobronchial       tree was examined to at least the first subsegmental level. Bronchial       mucosa and anatomy are normal; there are no endobronchial lesions, and       no secretions.      Brushings were obtained in the anterior segment of the right upper lobe       with a cytology brush and sent for routine cytology. Three samples were       obtained plus brush tip. Flexible bronchoscope was exchanged for       bronchoscope with endobronchial ultrasound. Transbronchial needle       aspirations of the following lesions were  performed using a Research officer, political party 19 gauge needle and sent for routine cytology.      - 4R x 3 (3 slides)      - Station 7 x 3 (3 slides)      - 4L x 9 (4 slides and remainder for flow cytometry) Impression:            - Bilateral hilar lymphadenopathy                        - Mediastinal adenopathy                        - The airway examination was normal.                        - Brushings were obtained.                        - A transbronchial needle aspiration was performed. Moderate Sedation:      General anesthesia Recommendation:        - Await biopsy and brushing results. Procedure Code(s):     --- Professional ---                        [phone], Bronchoscopy, rigid or flexible, including                         fluoroscopic guidance, when performed; with                         transbronchial needle aspiration biopsy(s), trachea,                         main stem and/or lobar bronchus(i)                        [id number], Bronchoscopy, rigid or flexible, including                         fluoroscopic guidance, when performed; with brushing                         or protected brushings Diagnosis Code(s):     --- Professional ---                        R59.0, Localized enlarged lymph nodes  CPT copyright 2019 American Medical Association. All rights reserved. The codes documented in this report are preliminary and upon coder review may  be revised to meet current compliance requirements. Rodman Pickle, MD 04/15/2021 8:01:21 PM This report has been signed electronically. Number of Addenda: 0

## 2021-04-15 NOTE — Anesthesia Procedure Notes (Addendum)
Procedure Name: Intubation Date/Time: 04/15/2021 9:02 AM Performed by: Reece Agar, CRNA Pre-anesthesia Checklist: Patient identified, Emergency Drugs available, Suction available and Patient being monitored Patient Re-evaluated:Patient Re-evaluated prior to induction Oxygen Delivery Method: Circle System Utilized Preoxygenation: Pre-oxygenation with 100% oxygen Induction Type: IV induction Ventilation: Mask ventilation without difficulty Laryngoscope Size: Mac and 4 Grade View: Grade I Tube type: Oral Tube size: 9.0 mm Number of attempts: 1 Airway Equipment and Method: Stylet Placement Confirmation: ETT inserted through vocal cords under direct vision, positive ETCO2 and breath sounds checked- equal and bilateral Secured at: 21 cm Tube secured with: Tape Dental Injury: Teeth and Oropharynx as per pre-operative assessment

## 2021-04-15 NOTE — H&P (Signed)
NAME:  Connor Adams, MRN:  [id number], DOB:  Oct 05, 1945, LOS: 0 ADMISSION DATE:  04/15/2021, CONSULTATION DATE:  04/15/21 REFERRING MD:  Loma Messing, MD CHIEF COMPLAINT:  Concern for recurrent lymphoma   History of Present Illness:  Connor Adams is a 75 year old male with stage I DLBCL s/p chemotherapy. PET CT completed on 03/27/21 demonstrated interval progression of malignancy including new areas of FDG uptake in the mediastinal and hilar lymph nodes bilaterally.  Plan for bronchoscopy today. Denies shortness of breath, cough, wheezing. Has unchanged mild lower extremity edema.  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   HEARING LOSS NOS OR DEAFNESS 11/17/2006   HYPERTENSION, BENIGN SYSTEMIC 11/17/2006   RHINITIS, ALLERGIC 11/17/2006   ASTHMA, UNSPECIFIED 11/17/2006   NEPHROLITHIASIS 11/17/2006   ACTINIC DERMATITIS 11/17/2006   APNEA, SLEEP 11/17/2006   Primary osteoarthritis of left knee 12/09/2016   Primary localized osteoarthritis of left knee 12/13/2016   DLBCL (diffuse large B cell lymphoma) ([place]) 05/24/2020   Encounter for antineoplastic chemotherapy 06/16/2020   Diffuse large b-cell lymphoma, intra-abdominal lymph nodes ([place]) 07/07/2020   Diffuse large B-cell lymphoma of intra-abdominal lymph nodes ([place]) 07/28/2020   Hypercalcemia 10/15/2020   Elevated serum creatinine    SOB (shortness of breath)    Resolved Ambulatory Problems    Diagnosis Date Noted   No Resolved Ambulatory Problems   Past Medical History:  Diagnosis Date   Arthritis    Chronic kidney disease (CKD), stage III (moderate) (HCC)    COVID-19    GERD (gastroesophageal reflux disease)    Hemorrhoids    History of ETT 3/05   History of hiatal hernia    History of kidney stones    HLD (hyperlipidemia)    HTN (hypertension)    Left inguinal hernia    Melanoma ([place])     Sciatica    Stroke ([place]) 2011   TIA (transient ischemic attack) 4/11    Objective   There were no vitals taken for this visit. Physical Exam: General: Well-appearing, no acute distress HENT: [place], AT Eyes: EOMI, no scleral icterus Respiratory: Clear to auscultation bilaterally.  No crackles, wheezing or rales Cardiovascular: RRR, -M/R/G, no JVD GI: BS+, soft, nontender Extremities: Trace pedal edema,-tenderness Neuro: AAO x4, CNII-XII grossly intact Psych: Normal mood, normal affect  Assessment & Plan:   Stage I DLBCL s/p chemotherapy Recent PET CT reviewed with PET demonstrating hypermetabolic lesions in the mediastinal and hilar lymph nodes. Amenable to bronchoscopy for tissue sampling.    We discussed diagnostic testing including with bronchoscopy. After addressing patient/family's questions, patient wishes to pursue diagnostic testing via bronchoscopy. We discussed risks and benefits of procedure including infection, bleeding and lung collapse. Patient consented to procedure.   Review of Systems:   Review of Systems  Constitutional:  Negative for chills, diaphoresis, fever, malaise/fatigue and weight loss.  HENT:  Negative for congestion, ear pain and sore throat.   Respiratory:  Negative for cough, hemoptysis, sputum production, shortness of breath and wheezing.   Cardiovascular:  Negative for chest pain, palpitations and leg swelling.  Gastrointestinal:  Negative for abdominal pain, heartburn and nausea.  Genitourinary:  Negative for frequency.  Musculoskeletal:  Negative for joint pain and myalgias.  Skin:  Negative for itching and rash.  Neurological:  Negative for dizziness, weakness and headaches.  Endo/Heme/Allergies:  Does not bruise/bleed easily.  Psychiatric/Behavioral:  Negative for depression. The patient is not nervous/anxious.     Past Medical History:  He,  has a past medical history of Arthritis, Chronic kidney disease (CKD), stage III (moderate) ([place]),  COVID-19, GERD (gastroesophageal reflux disease), Hemorrhoids, History of ETT (3/05), History of hiatal hernia, History of kidney stones, HLD (hyperlipidemia), HTN (hypertension), Left inguinal hernia, Melanoma ([place]), Sciatica, Stroke ([place]) (2011), and TIA (transient ischemic attack) (4/11).   Surgical History:   Past Surgical History:  Procedure Laterality Date   BMI 30.2 muscular  12/08/04   curretage actinic keratosis R ear  01/20/05   ETT low prob of CAD  11/21/03   excision melanoma in situ back  01/20/05   hemorrhoids sclerosed at colonoscopy  12/19/05   IR IMAGING GUIDED PORT INSERTION  05/19/2020   LAPAROSCOPIC INGUINAL HERNIA REPAIR Left 12/20/1995   LUMBAR DISC SURGERY     NERVE REPAIR     back of neck   retinal laser surgery Left 09/20/1986   SEPTOPLASTY  11/19/98   TOTAL KNEE ARTHROPLASTY Left 12/13/2016   Procedure: TOTAL KNEE ARTHROPLASTY WITH RIGHT KNEE CORTISONE INJECTION;  Surgeon: Frederik Pear, MD;  Location: [facility];  Service: Orthopedics;  Laterality: Left;   uric acid 6.9  12/22/04   x-ray l great toe  07/21/00     Social History:   reports that he has never smoked. He has never used smokeless tobacco. He reports that he does not drink alcohol and does not use drugs.   Family History:  His family history includes Asthma in his son; Diabetes in his brother; Heart attack (age of onset: 48) in his father; Heart attack (age of onset: 63) in his mother; Heart failure (age of onset: 27) in his brother; Liver cancer in his sister; Prostate cancer in his brother; Uterine cancer in his mother.   Allergies No Known Allergies   Home Medications  Prior to Admission medications   Medication Sig Start Date End Date Taking? Authorizing Provider  acetaminophen (TYLENOL) 325 MG tablet Take 2 tablets (650 mg total) by mouth every 6 (six) hours as needed for mild pain, moderate pain or fever. 06/20/20  Yes Curcio, Roselie Awkward, NP  albuterol (VENTOLIN HFA) 108 (90 Base) MCG/ACT inhaler Inhale 1-2  puffs into the lungs every 6 (six) hours as needed for wheezing or shortness of breath.   Yes [provider]  amLODipine (NORVASC) 5 MG tablet Take 5 mg by mouth in the morning. 03/16/21  Yes [provider]  aspirin EC 81 MG tablet Take 81 mg by mouth in the morning. Swallow whole.   Yes [provider]  fluticasone (FLONASE) 50 MCG/ACT nasal spray Place 1-2 sprays into both nostrils daily as needed for allergies or rhinitis. 08/22/20  Yes Curcio, Roselie Awkward, NP  hydrocortisone (ANUSOL-HC) 2.5 % rectal cream Place 1 application rectally daily as needed. Patient taking differently: Place 1 application rectally daily as needed for hemorrhoids or anal itching. 05/30/20  Yes Curcio, Roselie Awkward, NP  indomethacin (INDOCIN) 50 MG capsule Take 50 mg by mouth 3 (three) times daily as needed (GOUT). 04/03/21  Yes [provider]  Multiple Vitamin (MULTIVITAMIN WITH MINERALS) TABS tablet Take 1 tablet by mouth in the morning.   Yes [provider]  Omega-3 Fatty Acids (FISH OIL) 600 MG CAPS Take 600 mg by mouth in the morning.   Yes [provider]  polyvinyl alcohol (LIQUIFILM TEARS) 1.4 % ophthalmic solution Place 1 drop into both eyes in the morning.  Yes [provider]  rosuvastatin (CRESTOR) 5 MG tablet Take 5 mg by mouth in the morning.   Yes [provider]  amLODipine (NORVASC) 2.5 MG tablet Take 1 tablet (2.5 mg total) by mouth daily. Patient not taking: No sig reported 10/20/20 10/20/21  Georgette Shell, MD  feeding supplement, ENSURE ENLIVE, (ENSURE ENLIVE) LIQD Take 237 mLs by mouth 2 (two) times daily between meals. Patient not taking: Reported on 04/10/2021 06/20/20   Maryanna Shape, NP  potassium chloride SA (KLOR-CON) 20 MEQ tablet Take 1 tablet (20 mEq total) by mouth daily. Patient not taking: No sig reported 11/01/20   Orson Slick, MD    Rodman Pickle, M.D. [facility] Pulmonary/Critical Care Medicine 04/15/2021 8:36  AM   See Shea Evans for personal pager For hours between 7 PM to 7 AM, please call Elink for urgent questions

## 2021-04-15 NOTE — Transfer of Care (Signed)
Immediate Anesthesia Transfer of Care Note  Patient: Connor Adams  Procedure(s) Performed: ENDOBRONCHIAL ULTRASOUND BRONCHIAL BRUSHINGS BRONCHIAL NEEDLE ASPIRATION BIOPSIES  Patient Location: Endoscopy Unit  Anesthesia Type:General  Level of Consciousness: drowsy and patient cooperative  Airway & Oxygen Therapy: Patient Spontanous Breathing and Patient connected to nasal cannula oxygen  Post-op Assessment: Report given to RN, Post -op Vital signs reviewed and stable and Patient moving all extremities  Post vital signs: Reviewed and stable  Last Vitals:  Vitals Value Taken Time  BP 134/51 04/15/21 1057  Temp    Pulse 74 04/15/21 1057  Resp 16 04/15/21 1057  SpO2 100 % 04/15/21 1057  Vitals shown include unvalidated device data.  Last Pain:  Vitals:   04/15/21 1055  TempSrc: Oral  PainSc:          Complications: No notable events documented.

## 2021-04-15 NOTE — Anesthesia Preprocedure Evaluation (Signed)
Anesthesia Evaluation  Patient identified by MRN, date of birth, ID band Patient awake    Reviewed: Allergy & Precautions, NPO status , Patient's Chart, lab work & pertinent test results  Airway Mallampati: II  TM Distance: >3 FB Neck ROM: Full    Dental no notable dental hx.    Pulmonary sleep apnea ,    Pulmonary exam normal breath sounds clear to auscultation       Cardiovascular Exercise Tolerance: Good hypertension, Normal cardiovascular exam Rhythm:Regular Rate:Normal     Neuro/Psych sciatica TIA   GI/Hepatic hiatal hernia, GERD  ,  Endo/Other    Renal/GU Renal InsufficiencyRenal disease     Musculoskeletal  (+) Arthritis , Osteoarthritis,    Abdominal   Peds  Hematology   Anesthesia Other Findings   Reproductive/Obstetrics                             Anesthesia Physical Anesthesia Plan  ASA: 3  Anesthesia Plan: MAC   Post-op Pain Management:    Induction: Intravenous  PONV Risk Score and Plan: 1 and Propofol infusion, TIVA and Treatment may vary due to age or medical condition  Airway Management Planned: Natural Airway and Nasal Cannula  Additional Equipment:   Intra-op Plan:   Post-operative Plan:   Informed Consent: I have reviewed the patients History and Physical, chart, labs and discussed the procedure including the risks, benefits and alternatives for the proposed anesthesia with the patient or authorized representative who has indicated his/her understanding and acceptance.     Dental advisory given  Plan Discussed with: CRNA and Anesthesiologist  Anesthesia Plan Comments: (Posted as MAC. Will discuss to see if GETA more appropriate. Both options discussed with patient. )        Anesthesia Quick Evaluation

## 2021-04-15 NOTE — Anesthesia Postprocedure Evaluation (Signed)
Anesthesia Post Note  Patient: Connor Adams  Procedure(s) Performed: ENDOBRONCHIAL ULTRASOUND BRONCHIAL BRUSHINGS BRONCHIAL NEEDLE ASPIRATION BIOPSIES     Patient location during evaluation: PACU Anesthesia Type: General Level of consciousness: awake Pain management: pain level controlled Vital Signs Assessment: post-procedure vital signs reviewed and stable Respiratory status: spontaneous breathing and respiratory function stable Cardiovascular status: stable Postop Assessment: no apparent nausea or vomiting Anesthetic complications: no   No notable events documented.  Last Vitals:  Vitals:   04/15/21 1115 04/15/21 1125  BP: (!) 139/54 (!) 141/54  Pulse: 76 63  Resp: 14 15  Temp:    SpO2: 95% 98%    Last Pain:  Vitals:   04/15/21 1125  TempSrc:   PainSc: 0-No pain                 Merlinda Frederick

## 2021-04-16 ENCOUNTER — Encounter (HOSPITAL_COMMUNITY): Payer: Self-pay | Admitting: Pulmonary Disease

## 2021-04-16 DIAGNOSIS — C8333 Diffuse large B-cell lymphoma, intra-abdominal lymph nodes: Secondary | ICD-10-CM | POA: Diagnosis not present

## 2021-04-16 LAB — CYTOLOGY - NON PAP

## 2021-04-22 ENCOUNTER — Telehealth: Payer: Self-pay | Admitting: Hematology and Oncology

## 2021-04-22 ENCOUNTER — Telehealth: Payer: Self-pay | Admitting: *Deleted

## 2021-04-22 NOTE — Telephone Encounter (Signed)
Scheduled appt per 8/3 sch msg. Pt aware.  

## 2021-04-22 NOTE — Telephone Encounter (Signed)
Received call from pt's wife. She said that Connor Adams saw his dentist today and the dentist found a white lesion on his tongue. [name] asking if Dr. Lorenso Courier can see him about this. Advised that we would schedule an appt next week. We should have the biopsy results from 04/15/21 by then. She voiced understanding.

## 2021-04-23 DIAGNOSIS — X32XXXD Exposure to sunlight, subsequent encounter: Secondary | ICD-10-CM | POA: Diagnosis not present

## 2021-04-23 DIAGNOSIS — C8519 Unspecified B-cell lymphoma, extranodal and solid organ sites: Secondary | ICD-10-CM | POA: Diagnosis not present

## 2021-04-23 DIAGNOSIS — L570 Actinic keratosis: Secondary | ICD-10-CM | POA: Diagnosis not present

## 2021-04-30 ENCOUNTER — Inpatient Hospital Stay: Payer: Medicare PPO | Attending: Hematology and Oncology

## 2021-04-30 ENCOUNTER — Inpatient Hospital Stay (HOSPITAL_BASED_OUTPATIENT_CLINIC_OR_DEPARTMENT_OTHER): Payer: Medicare PPO | Admitting: Hematology and Oncology

## 2021-04-30 ENCOUNTER — Other Ambulatory Visit: Payer: Self-pay | Admitting: Hematology and Oncology

## 2021-04-30 ENCOUNTER — Other Ambulatory Visit: Payer: Self-pay

## 2021-04-30 VITALS — BP 163/81 | HR 54 | Temp 95.0°F | Resp 17 | Wt 182.2 lb

## 2021-04-30 DIAGNOSIS — C8333 Diffuse large B-cell lymphoma, intra-abdominal lymph nodes: Secondary | ICD-10-CM

## 2021-04-30 DIAGNOSIS — Z79899 Other long term (current) drug therapy: Secondary | ICD-10-CM | POA: Insufficient documentation

## 2021-04-30 DIAGNOSIS — C8338 Diffuse large B-cell lymphoma, lymph nodes of multiple sites: Secondary | ICD-10-CM | POA: Diagnosis not present

## 2021-04-30 LAB — CMP (CANCER CENTER ONLY)
ALT: 20 U/L (ref 0–44)
AST: 21 U/L (ref 15–41)
Albumin: 4 g/dL (ref 3.5–5.0)
Alkaline Phosphatase: 69 U/L (ref 38–126)
Anion gap: 9 (ref 5–15)
BUN: 24 mg/dL — ABNORMAL HIGH (ref 8–23)
CO2: 26 mmol/L (ref 22–32)
Calcium: 9.5 mg/dL (ref 8.9–10.3)
Chloride: 107 mmol/L (ref 98–111)
Creatinine: 1.36 mg/dL — ABNORMAL HIGH (ref 0.61–1.24)
GFR, Estimated: 55 mL/min — ABNORMAL LOW (ref >60)
Glucose, Bld: 116 mg/dL — ABNORMAL HIGH (ref 70–99)
Potassium: 4.1 mmol/L (ref 3.5–5.1)
Sodium: 142 mmol/L (ref 135–145)
Total Bilirubin: 0.4 mg/dL (ref 0.3–1.2)
Total Protein: 6.9 g/dL (ref 6.5–8.1)

## 2021-04-30 LAB — CBC WITH DIFFERENTIAL (CANCER CENTER ONLY)
Abs Immature Granulocytes: 0.01 10*3/uL (ref 0.00–0.07)
Basophils Absolute: 0 10*3/uL (ref 0.0–0.1)
Basophils Relative: 1 %
Eosinophils Absolute: 0.1 10*3/uL (ref 0.0–0.5)
Eosinophils Relative: 2 %
HCT: 37.8 % — ABNORMAL LOW (ref 39.0–52.0)
Hemoglobin: 12.8 g/dL — ABNORMAL LOW (ref 13.0–17.0)
Immature Granulocytes: 0 %
Lymphocytes Relative: 18 %
Lymphs Abs: 1.4 10*3/uL (ref 0.7–4.0)
MCH: 31.4 pg (ref 26.0–34.0)
MCHC: 33.9 g/dL (ref 30.0–36.0)
MCV: 92.9 fL (ref 80.0–100.0)
Monocytes Absolute: 0.5 10*3/uL (ref 0.1–1.0)
Monocytes Relative: 7 %
Neutro Abs: 5.3 10*3/uL (ref 1.7–7.7)
Neutrophils Relative %: 72 %
Platelet Count: 176 10*3/uL (ref 150–400)
RBC: 4.07 MIL/uL — ABNORMAL LOW (ref 4.22–5.81)
RDW: 14.2 % (ref 11.5–15.5)
WBC Count: 7.4 10*3/uL (ref 4.0–10.5)
nRBC: 0 % (ref 0.0–0.2)

## 2021-04-30 LAB — LACTATE DEHYDROGENASE: LDH: 175 U/L (ref 98–192)

## 2021-04-30 MED ORDER — HEPARIN SOD (PORK) LOCK FLUSH 100 UNIT/ML IV SOLN
500.0000 [IU] | Freq: Once | INTRAVENOUS | Status: AC
  Administered 2021-04-30: 500 [IU] via INTRAVENOUS
  Filled 2021-04-30: qty 5

## 2021-04-30 MED ORDER — SODIUM CHLORIDE 0.9% FLUSH
10.0000 mL | Freq: Once | INTRAVENOUS | Status: AC
  Administered 2021-04-30: 10 mL via INTRAVENOUS
  Filled 2021-04-30: qty 10

## 2021-04-30 NOTE — Progress Notes (Signed)
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   1) 04/17/2020:  CT A/P showed dominant nodal mass (11.5 x 10.9 cm) within the jejunal mesentery, highly suspicious for lymphoma. Additionally there are left lower lobe pleural-based pulmonary nodules 2) 04/24/2020: establish care with Dr. Lorenso Courier 3) 04/28/2020:  PET CT scan shows Large solid left mesenteric mass 13.0 cm with maximum SUV of 21.2, Deauville 5 with local hypermetabolic porta hepatis and retroperitoneal lymph nodes. Constitutes bulky Stage I disease.  4) 05/27/2020-05/31/2020: Cycle 1 of R-EPOCH 5) 06/16/2020-06/23/2020: Cycle 2 of R-EPOCH 6) 07/07/2020-07/11/2020: Cycle 3 of R-EPOCH  7) 07/28/2020-08/01/2020: Cycle 4 of R-EPOCH 8) 08/18/2020-08/22/2020: Cycle 5 of R-EPOCH 9) 09/08/2020-09/12/2020: Cycle 6 of R-EPOCH 10) 10/06/2020: PET CT scan shows decreased size of dominant mesenteric mass, however there was hypermetabolic activity within a single left anterior mesenteric lymph node  11) 12/31/2020: PET CT scan showed hypermetabolic nodular thickening the medial aspect of the RIGHT lower lobe is concerning for neoplasm versus atypical inflammatory response 12) 03/27/2021: PET CT Scan showed progression of disease compared with 12/30/2020. Multiple mediastinal and hilar lymph nodes exhibit new FDG uptake compatible with Deauville criteria 5. 13) 04/15/2021: bronchoscopy performed with biopsy of mediastinal lymph nodes.  Findings consistent with granulomatous disease.  No evidence of recurrence.  Interval History:  Connor Adams 75 y.o. male with medical history significant for Stage I DLBCL who presents for a follow up visit. The patient was last seen on 04/02/2021 .  In the interim since he underwent a bronchoscopy with biopsy  of his enlarged lymph nodes.  Pathology was consistent with granulomatous tissue.  No evidence of recurrent disease.  On exam today Connor Adams is accompanied by his wife.  He reports that he feels great and continues to have large amounts of energy and works Manufacturing engineer.  He has been eating well his weight is stable and he has no questions concerns or complaints.  He did have some whitish spots on the side of his tongue when being evaluated by dentist, however these have resolved with initiation of salt water rinses.  He is virtually asymptomatic on exam today.  He currently denies having issues with fevers, chills, sweats, nausea, vomiting or diarrhea.  A full 10 point ROS is listed below.  MEDICAL HISTORY:  Past Medical History:  Diagnosis Date   Arthritis    Chronic kidney disease (CKD), stage III (moderate) (HCC)    COVID-19    GERD (gastroesophageal reflux disease)    occ   Hemorrhoids    History of ETT 3/05   low risk   History of hiatal hernia    History of kidney stones    HLD (hyperlipidemia)    HTN (hypertension)    Left inguinal hernia    Melanoma ([place])    excied   Sciatica    from lumbar disc disease   Stroke ([place]) 2011   tia    TIA (transient ischemic attack) 4/11   associated w slurred speecha ndmild facial droop. lasted for about 10 minutes. Had MRI, etc with [facility] that per his report was ok (dr. Stephannie Adams). Echo (4/11): EF 55-60%, no regional WMAs, normal diastolic function, mild LAE, no source of embolus    SURGICAL HISTORY: Past Surgical History:  Procedure Laterality Date   BMI 30.2 muscular  12/08/04   BRONCHIAL BRUSHINGS  04/15/2021   Procedure: BRONCHIAL BRUSHINGS;  Surgeon: Margaretha Seeds, MD;  Location: [facility];  Service: Pulmonary;;   BRONCHIAL NEEDLE ASPIRATION BIOPSY  04/15/2021   Procedure: BRONCHIAL NEEDLE ASPIRATION BIOPSIES;  Surgeon: Margaretha Seeds, MD;  Location: [facility] ENDOSCOPY;  Service: Pulmonary;;   curretage actinic  keratosis R ear  01/20/05   ENDOBRONCHIAL ULTRASOUND N/A 04/15/2021   Procedure: ENDOBRONCHIAL ULTRASOUND;  Surgeon: Margaretha Seeds, MD;  Location: [facility];  Service: Pulmonary;  Laterality: N/A;   ETT low prob of CAD  11/21/03   excision melanoma in situ back  01/20/05   hemorrhoids sclerosed at colonoscopy  12/19/05   IR IMAGING GUIDED PORT INSERTION  05/19/2020   LAPAROSCOPIC INGUINAL HERNIA REPAIR Left 12/20/1995   LUMBAR DISC SURGERY     NERVE REPAIR     back of neck   retinal laser surgery Left 09/20/1986   SEPTOPLASTY  11/19/98   TOTAL KNEE ARTHROPLASTY Left 12/13/2016   Procedure: TOTAL KNEE ARTHROPLASTY WITH RIGHT KNEE CORTISONE INJECTION;  Surgeon: Frederik Pear, MD;  Location: [facility];  Service: Orthopedics;  Laterality: Left;   uric acid 6.9  12/22/04   x-ray l great toe  07/21/00    SOCIAL HISTORY: Social History   Socioeconomic History   Marital status: Married    Spouse name: Connor Adams   Number of children: 2   Years of education: 12   Highest education level: Not on file  Occupational History   Occupation: Systems developer: [employer]  Tobacco Use   Smoking status: Never   Smokeless tobacco: Never  Vaping Use   Vaping Use: Never used  Substance and Sexual Activity   Alcohol use: No   Drug use: No   Sexual activity: Yes  Other Topics Concern   Not on file  Social History Narrative   Married to [employer] at [employer].   Son, Connor Adams married   Son, Connor Adams, Married.    Caffeine use: 1 cup coffee per day   Social Determinants of Health   Financial Resource Strain: Not on file  Food Insecurity: Not on file  Transportation Needs: Not on file  Physical Activity: Not on file  Stress: Not on file  Social Connections: Not on file  Intimate Partner Violence: Not on file    FAMILY HISTORY: Family History  Problem Relation Age of Onset   Liver cancer Sister    Heart failure Brother 68       CABGx4   Heart attack Mother 69    Uterine cancer Mother    Heart attack Father 66   Asthma Son    Diabetes Brother    Prostate cancer Brother     ALLERGIES:  has No Known Allergies.  MEDICATIONS:  Current Outpatient Medications  Medication Sig Dispense Refill   acetaminophen (TYLENOL) 325 MG tablet Take 2 tablets (650 mg total) by mouth every 6 (six) hours as needed for mild pain, moderate pain or fever.     albuterol (VENTOLIN HFA) 108 (90 Base) MCG/ACT inhaler Inhale 1-2 puffs into the lungs every 6 (six) hours as needed for wheezing or shortness of breath.     amLODipine (NORVASC) 5 MG tablet Take 5 mg by mouth in the morning.     aspirin EC 81 MG tablet Take 81 mg by mouth in the morning. Swallow whole.     fluticasone (FLONASE) 50 MCG/ACT nasal spray Place 1-2 sprays  into both nostrils daily as needed for allergies or rhinitis.  2   hydrocortisone (ANUSOL-HC) 2.5 % rectal cream Place 1 application rectally daily as needed. (Patient taking differently: Place 1 application rectally daily as needed for hemorrhoids or anal itching.) 30 g 0   indomethacin (INDOCIN) 50 MG capsule Take 50 mg by mouth 3 (three) times daily as needed (GOUT).     Multiple Vitamin (MULTIVITAMIN WITH MINERALS) TABS tablet Take 1 tablet by mouth in the morning.     Omega-3 Fatty Acids (FISH OIL) 600 MG CAPS Take 600 mg by mouth in the morning.     polyvinyl alcohol (LIQUIFILM TEARS) 1.4 % ophthalmic solution Place 1 drop into both eyes in the morning.     potassium chloride SA (KLOR-CON) 20 MEQ tablet Take 1 tablet (20 mEq total) by mouth daily. (Patient not taking: No sig reported) 14 tablet 0   rosuvastatin (CRESTOR) 5 MG tablet Take 5 mg by mouth in the morning.     No current facility-administered medications for this visit.    REVIEW OF SYSTEMS:   Constitutional: ( - ) fevers, ( - )  chills , ( - ) night sweats Eyes: ( - ) blurriness of vision, ( - ) double vision, ( - ) watery eyes Ears, nose, mouth, throat, and face: ( - ) mucositis, (  - ) sore throat Respiratory: ( - ) cough, ( - ) dyspnea, ( - ) wheezes Cardiovascular: ( - ) palpitation, ( - ) chest discomfort, ( - ) lower extremity swelling Gastrointestinal:  ( - ) nausea, ( - ) heartburn, ( - ) change in bowel habits Skin: ( - ) abnormal skin rashes Lymphatics: ( - ) new lymphadenopathy, ( - ) easy bruising Neurological: ( - ) numbness, ( - ) tingling, ( - ) new weaknesses Behavioral/Psych: ( - ) mood change, ( - ) new changes  All other systems were reviewed with the patient and are negative.  PHYSICAL EXAMINATION: ECOG PERFORMANCE STATUS: 1 - Symptomatic but completely ambulatory  Vitals:   04/30/21 1407  BP: (!) 163/81  Pulse: (!) 54  Resp: 17  Temp: (!) 95 F (35 C)  SpO2: 100%   Filed Weights   04/30/21 1407  Weight: 182 lb 3.2 oz (82.6 kg)    GENERAL: well appearing elderly Caucasian male. alert, no distress and comfortable SKIN: skin color, texture, turgor are normal, no rashes or significant lesions.  EYES: conjunctiva are pink and non-injected, sclera clear LUNGS: clear to auscultation and percussion with normal breathing effort HEART: regular rate & rhythm and no murmurs and trace lower extremity edema Musculoskeletal: no cyanosis of digits and no clubbing  PSYCH: alert & oriented x 3, fluent speech NEURO: no focal motor/sensory deficits  LABORATORY DATA:  I have reviewed the data as listed CBC Latest Ref Rng & Units 04/30/2021 04/02/2021 02/19/2021  WBC 4.0 - 10.5 K/uL 7.4 6.7 3.6(L)  Hemoglobin 13.0 - 17.0 g/dL 12.8(L) 12.8(L) 13.1  Hematocrit 39.0 - 52.0 % 37.8(L) 38.8(L) 40.7  Platelets 150 - 400 K/uL 176 185 170    CMP Latest Ref Rng & Units 04/30/2021 04/02/2021 02/19/2021  Glucose 70 - 99 mg/dL 116(H) 98 95  BUN 8 - 23 mg/dL 24(H) 24(H) 23  Creatinine 0.61 - 1.24 mg/dL 1.36(H) 1.37(H) 1.23  Sodium 135 - 145 mmol/L 142 142 142  Potassium 3.5 - 5.1 mmol/L 4.1 4.2 4.1  Chloride 98 - 111 mmol/L 107 105 106  CO2 22 - 32 mmol/L  $'26 29 26   'g$ Calcium 8.9 - 10.3 mg/dL 9.5 9.5 9.7  Total Protein 6.5 - 8.1 g/dL 6.9 6.9 7.0  Total Bilirubin 0.3 - 1.2 mg/dL 0.4 0.4 0.5  Alkaline Phos 38 - 126 U/L 69 57 54  AST 15 - 41 U/L '21 22 26  '$ ALT 0 - 44 U/L '20 20 18    '$ RADIOGRAPHIC STUDIES: I have personally reviewed the radiological images as listed and agreed with the findings in the report: large abdominal mass markedly decreased in FDG avidity.  Increased brightness of lymph nodes in the chest, concerning for progression.  No results found.  ASSESSMENT & PLAN CALLISTER KIESCHNICK 75 y.o. male with medical history significant for Stage I DLBCL who presents for a follow up visit.  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  At this time he has completed all planned chemotherapy treatment (R-EPOCH x 6 cycles).   On exam today Mr. Hodak continues to have good levels of energy with no symptoms.  Unfortunately his most recent PET CT scan on 03/27/2021 showed concern for progression.  We had a bronchoscopy performed to biopsy these lymph nodes, but there is no evidence of recurrence.  Given these findings we will repeat PET CT scan approximately 3 months time in order to assure that these do not represent true recurrence of disease.  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  --findings are consistent with a double hit DLBCL Treatment of choice is R-EPOCH chemotherapy in the inpatient setting. --he is currently s/p Cycle 6 administered on 09/08/2020 --patient has a port in place. TTE was performed and showed strong baseline heart function. --PET CT scan on 04/16/2021 showed concern for progressive disease in the chest. --biopsy via bronchoscopy  shows no clear evidence of recurrent disease.  Findings are most consistent with granulomatous, inflammatory tissue. --case discussed with Dr. Cassell Clement of [facility]. She recommendations if recurrence is found that we consider initiation of rituximab/polatuzumab. She will schedule an appointment to see if CAR-T therapy may be an option. -- Return to clinic following repeat PET CT scan in late Oct 2022.   #Lung Nodules, resolved -- noted on interim PET CT scan after Cycle 4.  --unclear if these represent areas of infection, metastatic disease, or progression of lymphoma --pulmonology consulted, appreciate their input.  --will monitor respiratory status closely   Orders Placed This Encounter  Procedures   NM PET Image Restag (PS) Skull Base To Thigh    Standing Status:   Future    Standing Expiration Date:   04/30/2022    Order Specific Question:   If indicated for the ordered procedure, I authorize the administration of a radiopharmaceutical per Radiology protocol    Answer:   Yes    Order Specific Question:   Preferred imaging location?    Answer:   [name]    All questions were answered. The patient knows to call the clinic with any problems, questions or concerns.  A total of more than 30 minutes were spent on this encounter and over half of that time was spent on counseling and coordination of care as outlined above.   [name], MD Department of Hematology/Oncology [facility]  at [facility] Phone: [phone] Pager: [phone] Email: [email or web address]  04/30/2021 4:28 PM

## 2021-05-07 DIAGNOSIS — Z8579 Personal history of other malignant neoplasms of lymphoid, hematopoietic and related tissues: Secondary | ICD-10-CM | POA: Diagnosis not present

## 2021-05-07 DIAGNOSIS — N183 Chronic kidney disease, stage 3 unspecified: Secondary | ICD-10-CM | POA: Diagnosis not present

## 2021-05-07 DIAGNOSIS — E7800 Pure hypercholesterolemia, unspecified: Secondary | ICD-10-CM | POA: Diagnosis not present

## 2021-05-07 DIAGNOSIS — I10 Essential (primary) hypertension: Secondary | ICD-10-CM | POA: Diagnosis not present

## 2021-05-07 DIAGNOSIS — R7303 Prediabetes: Secondary | ICD-10-CM | POA: Diagnosis not present

## 2021-05-07 DIAGNOSIS — R7309 Other abnormal glucose: Secondary | ICD-10-CM | POA: Diagnosis not present

## 2021-06-04 DIAGNOSIS — L720 Epidermal cyst: Secondary | ICD-10-CM | POA: Diagnosis not present

## 2021-06-04 DIAGNOSIS — X32XXXD Exposure to sunlight, subsequent encounter: Secondary | ICD-10-CM | POA: Diagnosis not present

## 2021-06-04 DIAGNOSIS — L570 Actinic keratosis: Secondary | ICD-10-CM | POA: Diagnosis not present

## 2021-06-04 DIAGNOSIS — L080 Pyoderma: Secondary | ICD-10-CM | POA: Diagnosis not present

## 2021-06-04 DIAGNOSIS — L820 Inflamed seborrheic keratosis: Secondary | ICD-10-CM | POA: Diagnosis not present

## 2021-06-08 ENCOUNTER — Telehealth: Payer: Self-pay | Admitting: Hematology and Oncology

## 2021-06-08 NOTE — Telephone Encounter (Signed)
Sch per 9/18 inbasket, pt aware

## 2021-06-10 ENCOUNTER — Other Ambulatory Visit: Payer: Self-pay

## 2021-06-10 ENCOUNTER — Inpatient Hospital Stay: Payer: Medicare PPO | Attending: Hematology and Oncology

## 2021-06-10 DIAGNOSIS — C8338 Diffuse large B-cell lymphoma, lymph nodes of multiple sites: Secondary | ICD-10-CM | POA: Diagnosis not present

## 2021-06-10 DIAGNOSIS — Z452 Encounter for adjustment and management of vascular access device: Secondary | ICD-10-CM | POA: Diagnosis not present

## 2021-06-10 DIAGNOSIS — Z95828 Presence of other vascular implants and grafts: Secondary | ICD-10-CM

## 2021-06-10 MED ORDER — SODIUM CHLORIDE 0.9% FLUSH
10.0000 mL | Freq: Once | INTRAVENOUS | Status: AC
  Administered 2021-06-10: 10 mL via INTRAVENOUS

## 2021-06-10 MED ORDER — HEPARIN SOD (PORK) LOCK FLUSH 100 UNIT/ML IV SOLN
500.0000 [IU] | Freq: Once | INTRAVENOUS | Status: AC
  Administered 2021-06-10: 500 [IU] via INTRAVENOUS

## 2021-07-01 DIAGNOSIS — H2512 Age-related nuclear cataract, left eye: Secondary | ICD-10-CM | POA: Diagnosis not present

## 2021-07-01 DIAGNOSIS — Z961 Presence of intraocular lens: Secondary | ICD-10-CM | POA: Diagnosis not present

## 2021-07-22 ENCOUNTER — Ambulatory Visit (HOSPITAL_COMMUNITY)
Admission: RE | Admit: 2021-07-22 | Discharge: 2021-07-22 | Disposition: A | Discharge status: Home / Self Care | Payer: Medicare PPO | Source: Ambulatory Visit | Attending: Hematology and Oncology | Admitting: Hematology and Oncology

## 2021-07-22 ENCOUNTER — Other Ambulatory Visit: Payer: Self-pay

## 2021-07-22 DIAGNOSIS — C8333 Diffuse large B-cell lymphoma, intra-abdominal lymph nodes: Secondary | ICD-10-CM | POA: Diagnosis not present

## 2021-07-22 DIAGNOSIS — N200 Calculus of kidney: Secondary | ICD-10-CM | POA: Insufficient documentation

## 2021-07-22 DIAGNOSIS — I2510 Atherosclerotic heart disease of native coronary artery without angina pectoris: Secondary | ICD-10-CM | POA: Diagnosis not present

## 2021-07-22 DIAGNOSIS — R918 Other nonspecific abnormal finding of lung field: Secondary | ICD-10-CM | POA: Insufficient documentation

## 2021-07-22 DIAGNOSIS — I700 Atherosclerosis of aorta: Secondary | ICD-10-CM | POA: Insufficient documentation

## 2021-07-22 LAB — GLUCOSE, CAPILLARY: Glucose-Capillary: 98 mg/dL (ref 70–99)

## 2021-07-22 MED ORDER — FLUDEOXYGLUCOSE F - 18 (FDG) INJECTION
9.1000 | Freq: Once | INTRAVENOUS | Status: AC | PRN
  Administered 2021-07-22: 8.9 via INTRAVENOUS

## 2021-07-23 ENCOUNTER — Other Ambulatory Visit: Payer: Medicare PPO

## 2021-07-23 ENCOUNTER — Other Ambulatory Visit: Payer: Self-pay | Admitting: Hematology and Oncology

## 2021-07-23 ENCOUNTER — Inpatient Hospital Stay: Payer: Medicare PPO

## 2021-07-23 ENCOUNTER — Inpatient Hospital Stay: Payer: Medicare PPO | Attending: Hematology and Oncology | Admitting: Hematology and Oncology

## 2021-07-23 VITALS — BP 166/87 | HR 58 | Temp 97.8°F | Resp 17 | Wt 187.5 lb

## 2021-07-23 DIAGNOSIS — Z95828 Presence of other vascular implants and grafts: Secondary | ICD-10-CM | POA: Insufficient documentation

## 2021-07-23 DIAGNOSIS — Z23 Encounter for immunization: Secondary | ICD-10-CM

## 2021-07-23 DIAGNOSIS — C8333 Diffuse large B-cell lymphoma, intra-abdominal lymph nodes: Secondary | ICD-10-CM

## 2021-07-23 DIAGNOSIS — C8338 Diffuse large B-cell lymphoma, lymph nodes of multiple sites: Secondary | ICD-10-CM | POA: Diagnosis present

## 2021-07-23 DIAGNOSIS — C8330 Diffuse large B-cell lymphoma, unspecified site: Secondary | ICD-10-CM | POA: Diagnosis not present

## 2021-07-23 LAB — CMP (CANCER CENTER ONLY)
ALT: 22 U/L (ref 0–44)
AST: 24 U/L (ref 15–41)
Albumin: 4 g/dL (ref 3.5–5.0)
Alkaline Phosphatase: 62 U/L (ref 38–126)
Anion gap: 9 (ref 5–15)
BUN: 21 mg/dL (ref 8–23)
CO2: 27 mmol/L (ref 22–32)
Calcium: 9.2 mg/dL (ref 8.9–10.3)
Chloride: 107 mmol/L (ref 98–111)
Creatinine: 1.35 mg/dL — ABNORMAL HIGH (ref 0.61–1.24)
GFR, Estimated: 55 mL/min — ABNORMAL LOW (ref >60)
Glucose, Bld: 136 mg/dL — ABNORMAL HIGH (ref 70–99)
Potassium: 4.1 mmol/L (ref 3.5–5.1)
Sodium: 143 mmol/L (ref 135–145)
Total Bilirubin: 0.5 mg/dL (ref 0.3–1.2)
Total Protein: 7 g/dL (ref 6.5–8.1)

## 2021-07-23 LAB — CBC WITH DIFFERENTIAL (CANCER CENTER ONLY)
Abs Immature Granulocytes: 0.01 10*3/uL (ref 0.00–0.07)
Basophils Absolute: 0 10*3/uL (ref 0.0–0.1)
Basophils Relative: 1 %
Eosinophils Absolute: 0.2 10*3/uL (ref 0.0–0.5)
Eosinophils Relative: 3 %
HCT: 40.8 % (ref 39.0–52.0)
Hemoglobin: 13.3 g/dL (ref 13.0–17.0)
Immature Granulocytes: 0 %
Lymphocytes Relative: 34 %
Lymphs Abs: 2.2 10*3/uL (ref 0.7–4.0)
MCH: 31.1 pg (ref 26.0–34.0)
MCHC: 32.6 g/dL (ref 30.0–36.0)
MCV: 95.6 fL (ref 80.0–100.0)
Monocytes Absolute: 0.5 10*3/uL (ref 0.1–1.0)
Monocytes Relative: 7 %
Neutro Abs: 3.5 10*3/uL (ref 1.7–7.7)
Neutrophils Relative %: 55 %
Platelet Count: 167 10*3/uL (ref 150–400)
RBC: 4.27 MIL/uL (ref 4.22–5.81)
RDW: 14 % (ref 11.5–15.5)
WBC Count: 6.4 10*3/uL (ref 4.0–10.5)
nRBC: 0 % (ref 0.0–0.2)

## 2021-07-23 LAB — LACTATE DEHYDROGENASE: LDH: 168 U/L (ref 98–192)

## 2021-07-23 MED ORDER — INFLUENZA VAC A&B SA ADJ QUAD 0.5 ML IM PRSY
0.5000 mL | PREFILLED_SYRINGE | Freq: Once | INTRAMUSCULAR | Status: AC
  Administered 2021-07-23: 0.5 mL via INTRAMUSCULAR
  Filled 2021-07-23: qty 0.5

## 2021-07-23 MED ORDER — HEPARIN SOD (PORK) LOCK FLUSH 100 UNIT/ML IV SOLN
500.0000 [IU] | Freq: Once | INTRAVENOUS | Status: AC
  Administered 2021-07-23: 500 [IU]

## 2021-07-23 MED ORDER — SODIUM CHLORIDE 0.9% FLUSH
10.0000 mL | Freq: Once | INTRAVENOUS | Status: AC
  Administered 2021-07-23: 10 mL

## 2021-07-23 NOTE — Patient Instructions (Signed)
Provided Flu vaccine information

## 2021-07-26 ENCOUNTER — Encounter: Payer: Self-pay | Admitting: Hematology and Oncology

## 2021-07-26 NOTE — Progress Notes (Signed)
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   1) 04/17/2020:  CT A/P showed dominant nodal mass (11.5 x 10.9 cm) within the jejunal mesentery, highly suspicious for lymphoma. Additionally there are left lower lobe pleural-based pulmonary nodules 2) 04/24/2020: establish care with Dr. Lorenso Adams 3) 04/28/2020:  PET CT scan shows Large solid left mesenteric mass 13.0 cm with maximum SUV of 21.2, Deauville 5 with local hypermetabolic porta hepatis and retroperitoneal lymph nodes. Constitutes bulky Stage I disease.  4) 05/27/2020-05/31/2020: Cycle 1 of R-EPOCH 5) 06/16/2020-06/23/2020: Cycle 2 of R-EPOCH 6) 07/07/2020-07/11/2020: Cycle 3 of R-EPOCH  7) 07/28/2020-08/01/2020: Cycle 4 of R-EPOCH 8) 08/18/2020-08/22/2020: Cycle 5 of R-EPOCH 9) 09/08/2020-09/12/2020: Cycle 6 of R-EPOCH 10) 10/06/2020: PET CT scan shows decreased size of dominant mesenteric mass, however there was hypermetabolic activity within a single left anterior mesenteric lymph node  11) 12/31/2020: PET CT scan showed hypermetabolic nodular thickening the medial aspect of the RIGHT lower lobe is concerning for neoplasm versus atypical inflammatory response 12) 03/27/2021: PET CT Scan showed progression of disease compared with 12/30/2020. Multiple mediastinal and hilar lymph nodes exhibit new FDG uptake compatible with Deauville criteria 5. 13) 04/15/2021: bronchoscopy performed with biopsy of mediastinal lymph nodes.  Findings consistent with granulomatous disease.  No evidence of recurrence. 14) 07/22/2021: PET CT scan showed mediastinal, hilar, abdominal and inguinal lymph nodes, compatible with the provided history of lymphoma (overall Deauville 4).  Interval History:  Connor Adams 75 y.o. male with medical history  significant for Stage I DLBCL who presents for a follow up visit. The patient was last seen on 04/30/2021 .  In the interim since he underwent a PET CT scan which showed continued FDG avid lymph nodes.   On exam today Connor Adams is unaccompanied.  He reports that his wife is doing well and that he also maintains excellent levels of energy.  His weight is increasing and is up to 187 pounds.  Has been doing his best to exercise weekly and has been keeping quite busy.  He notes no systemic symptoms of any kind.  He is virtually asymptomatic on exam today.  He currently denies having issues with fevers, chills, sweats, nausea, vomiting or diarrhea.  A full 10 point ROS is listed below.  MEDICAL HISTORY:  Past Medical History:  Diagnosis Date   Arthritis    Chronic kidney disease (CKD), stage III (moderate) (HCC)    COVID-19    GERD (gastroesophageal reflux disease)    occ   Hemorrhoids    History of ETT 3/05   low risk   History of hiatal hernia    History of kidney stones    HLD (hyperlipidemia)    HTN (hypertension)    Left inguinal hernia    Melanoma ([place])    excied   Sciatica    from lumbar disc disease   Stroke ([place]) 2011   tia    TIA (transient ischemic attack) 4/11   associated w slurred speecha ndmild facial droop. lasted for about 10 minutes. Had MRI, etc with [facility] that per his report was ok (dr. Stephannie Adams). Echo (4/11): EF 55-60%, no regional WMAs, normal diastolic function, mild LAE, no source of embolus    SURGICAL HISTORY: Past Surgical History:  Procedure Laterality Date   BMI 30.2 muscular  12/08/04  BRONCHIAL BRUSHINGS  04/15/2021   Procedure: BRONCHIAL BRUSHINGS;  Surgeon: Connor Seeds, MD;  Location: [facility] ENDOSCOPY;  Service: Pulmonary;;   BRONCHIAL NEEDLE ASPIRATION BIOPSY  04/15/2021   Procedure: BRONCHIAL NEEDLE ASPIRATION BIOPSIES;  Surgeon: Connor Seeds, MD;  Location: [facility] ENDOSCOPY;  Service: Pulmonary;;   curretage actinic keratosis R  ear  01/20/05   ENDOBRONCHIAL ULTRASOUND N/A 04/15/2021   Procedure: ENDOBRONCHIAL ULTRASOUND;  Surgeon: Connor Seeds, MD;  Location: [place];  Service: Pulmonary;  Laterality: N/A;   ETT low prob of CAD  11/21/03   excision melanoma in situ back  01/20/05   hemorrhoids sclerosed at colonoscopy  12/19/05   IR IMAGING GUIDED PORT INSERTION  05/19/2020   LAPAROSCOPIC INGUINAL HERNIA REPAIR Left 12/20/1995   LUMBAR DISC SURGERY     NERVE REPAIR     back of neck   retinal laser surgery Left 09/20/1986   SEPTOPLASTY  11/19/98   TOTAL KNEE ARTHROPLASTY Left 12/13/2016   Procedure: TOTAL KNEE ARTHROPLASTY WITH RIGHT KNEE CORTISONE INJECTION;  Surgeon: Frederik Pear, MD;  Location: [place];  Service: Orthopedics;  Laterality: Left;   uric acid 6.9  12/22/04   x-ray l great toe  07/21/00    SOCIAL HISTORY: Social History   Socioeconomic History   Marital status: Married    Spouse name: Connor Adams   Number of children: 2   Years of education: 12   Highest education level: Not on file  Occupational History   Occupation: Systems developer: [employer]  Tobacco Use   Smoking status: Never   Smokeless tobacco: Never  Vaping Use   Vaping Use: Never used  Substance and Sexual Activity   Alcohol use: No   Drug use: No   Sexual activity: Yes  Other Topics Concern   Not on file  Social History Narrative   Married to [employer] at [employer].   Son, Connor Adams married   Son, Connor Adams, Married.    Caffeine use: 1 cup coffee per day   Social Determinants of Health   Financial Resource Strain: Not on file  Food Insecurity: Not on file  Transportation Needs: Not on file  Physical Activity: Not on file  Stress: Not on file  Social Connections: Not on file  Intimate Partner Violence: Not on file    FAMILY HISTORY: Family History  Problem Relation Age of Onset   Liver cancer Sister    Heart failure Brother 51       CABGx4   Heart attack Mother 66   Uterine cancer  Mother    Heart attack Father 67   Asthma Son    Diabetes Brother    Prostate cancer Brother     ALLERGIES:  has No Known Allergies.  MEDICATIONS:  Current Outpatient Medications  Medication Sig Dispense Refill   acetaminophen (TYLENOL) 325 MG tablet Take 2 tablets (650 mg total) by mouth every 6 (six) hours as needed for mild pain, moderate pain or fever.     albuterol (VENTOLIN HFA) 108 (90 Base) MCG/ACT inhaler Inhale 1-2 puffs into the lungs every 6 (six) hours as needed for wheezing or shortness of breath.     amLODipine (NORVASC) 5 MG tablet Take 5 mg by mouth in the morning.     aspirin EC 81 MG tablet Take 81 mg by mouth in the morning. Swallow whole.     fluticasone (FLONASE) 50 MCG/ACT nasal spray Place 1-2 sprays into both nostrils  daily as needed for allergies or rhinitis.  2   hydrocortisone (ANUSOL-HC) 2.5 % rectal cream Place 1 application rectally daily as needed. (Patient taking differently: Place 1 application rectally daily as needed for hemorrhoids or anal itching.) 30 g 0   indomethacin (INDOCIN) 50 MG capsule Take 50 mg by mouth 3 (three) times daily as needed (GOUT).     Multiple Vitamin (MULTIVITAMIN WITH MINERALS) TABS tablet Take 1 tablet by mouth in the morning.     Omega-3 Fatty Acids (FISH OIL) 600 MG CAPS Take 600 mg by mouth in the morning.     polyvinyl alcohol (LIQUIFILM TEARS) 1.4 % ophthalmic solution Place 1 drop into both eyes in the morning.     rosuvastatin (CRESTOR) 5 MG tablet Take 5 mg by mouth in the morning.     No current facility-administered medications for this visit.    REVIEW OF SYSTEMS:   Constitutional: ( - ) fevers, ( - )  chills , ( - ) night sweats Eyes: ( - ) blurriness of vision, ( - ) double vision, ( - ) watery eyes Ears, nose, mouth, throat, and face: ( - ) mucositis, ( - ) sore throat Respiratory: ( - ) cough, ( - ) dyspnea, ( - ) wheezes Cardiovascular: ( - ) palpitation, ( - ) chest discomfort, ( - ) lower extremity  swelling Gastrointestinal:  ( - ) nausea, ( - ) heartburn, ( - ) change in bowel habits Skin: ( - ) abnormal skin rashes Lymphatics: ( - ) new lymphadenopathy, ( - ) easy bruising Neurological: ( - ) numbness, ( - ) tingling, ( - ) new weaknesses Behavioral/Psych: ( - ) mood change, ( - ) new changes  All other systems were reviewed with the patient and are negative.  PHYSICAL EXAMINATION: ECOG PERFORMANCE STATUS: 1 - Symptomatic but completely ambulatory  Vitals:   07/23/21 1050  BP: (!) 166/87  Pulse: (!) 58  Resp: 17  Temp: 97.8 F (36.6 C)  SpO2: 97%   Filed Weights   07/23/21 1050  Weight: 187 lb 8 oz (85 kg)    GENERAL: well appearing elderly Caucasian male. alert, no distress and comfortable SKIN: skin color, texture, turgor are normal, no rashes or significant lesions.  EYES: conjunctiva are pink and non-injected, sclera clear LUNGS: clear to auscultation and percussion with normal breathing effort HEART: regular rate & rhythm and no murmurs and trace lower extremity edema Musculoskeletal: no cyanosis of digits and no clubbing  PSYCH: alert & oriented x 3, fluent speech NEURO: no focal motor/sensory deficits  LABORATORY DATA:  I have reviewed the data as listed CBC Latest Ref Rng & Units 07/23/2021 04/30/2021 04/02/2021  WBC 4.0 - 10.5 K/uL 6.4 7.4 6.7  Hemoglobin 13.0 - 17.0 g/dL 13.3 12.8(L) 12.8(L)  Hematocrit 39.0 - 52.0 % 40.8 37.8(L) 38.8(L)  Platelets 150 - 400 K/uL 167 176 185    CMP Latest Ref Rng & Units 07/23/2021 04/30/2021 04/02/2021  Glucose 70 - 99 mg/dL 136(H) 116(H) 98  BUN 8 - 23 mg/dL 21 24(H) 24(H)  Creatinine 0.61 - 1.24 mg/dL 1.35(H) 1.36(H) 1.37(H)  Sodium 135 - 145 mmol/L 143 142 142  Potassium 3.5 - 5.1 mmol/L 4.1 4.1 4.2  Chloride 98 - 111 mmol/L 107 107 105  CO2 22 - 32 mmol/L 27 26 29   Calcium 8.9 - 10.3 mg/dL 9.2 9.5 9.5  Total Protein 6.5 - 8.1 g/dL 7.0 6.9 6.9  Total Bilirubin 0.3 - 1.2 mg/dL 0.5 0.4  0.4  Alkaline Phos 38 - 126  U/L 62 69 57  AST 15 - 41 U/L 24 21 22   ALT 0 - 44 U/L 22 20 20     RADIOGRAPHIC STUDIES: I have personally reviewed the radiological images as listed and agreed with the findings in the report: large abdominal mass markedly decreased in FDG avidity.  Increased brightness of lymph nodes in the chest, concerning for progression.  NM PET Image Restag (PS) Skull Base To Thigh  Result Date: 07/22/2021 CLINICAL DATA:  Subsequent treatment strategy for large B-cell lymphoma. EXAM: NUCLEAR MEDICINE PET SKULL BASE TO THIGH TECHNIQUE: 8.9 mCi F-18 FDG was injected intravenously. Full-ring PET imaging was performed from the skull base to thigh after the radiotracer. CT data was obtained and used for attenuation correction and anatomic localization. Fasting blood glucose: 98 mg/dl COMPARISON:  03/27/2021. FINDINGS: Mediastinal blood pool activity: SUV max 2.1 Liver activity: SUV max 3.4 NECK: No abnormal hypermetabolism. Incidental CT findings: None. CHEST: Numerous hypermetabolic mediastinal and hilar lymph nodes. Index low left paratracheal lymph node measures 13 mm with an SUV max of 4.4. No hypermetabolic axillary lymph nodes. Bilateral lower lobe pulmonary nodules are unchanged in size. Index medial right lower lobe nodule measures 12 mm (8/50) with an SUV max of 2.2. Incidental CT findings: Atherosclerotic calcification of the aorta and coronary arteries. Heart size normal. No pericardial or pleural effusion. ABDOMEN/PELVIS: There is focal hypermetabolism in the left adrenal gland, SUV max 3.2, similar to prior but without a CT correlate. No abnormal hypermetabolism in the liver, right adrenal gland, spleen or pancreas. Small portacaval lymph nodes measure up to 10 mm (4/123), with an SUV max of 4.2 similar. Mildly hypermetabolic inguinal lymph nodes measure up to 6 mm on the left (4/197) with an SUV max 2.3. Finally soft tissue thickening along the left inguinal canal appears similar and CT morphology by  increased in hypermetabolism, now 4.7, compared to 3.1 previously. Mesenteric haziness and nodularity associated mild hypermetabolism. Index nodule in the left abdomen measures 7 mm (4/142) with an SUV max of 3.1 compared to 5.7 previously. Incidental CT findings: Liver, gallbladder and adrenal glands are unremarkable. Multiple stones in the kidneys. Hyperdense lesions off the left kidney measure up to 1.4 cm, too small to characterize. Spleen is normal in size. Pancreas, stomach and bowel are grossly unremarkable. SKELETON: No abnormal hypermetabolism. Incidental CT findings: Degenerative changes in the spine. Bilateral L5 pars defects. Old left rib fractures. IMPRESSION: 1. Hypermetabolic mediastinal, hilar, abdominal and inguinal lymph nodes, compatible with the provided history of lymphoma (overall Deauville 4). 2. Stable but mildly hypermetabolic pulmonary nodules, indicative of metastatic disease. 3. Increasingly hypermetabolic soft tissue thickening along the superior margin of the left inguinal canal may be postoperative in etiology. Please correlate clinically as lymphomatous involvement cannot be excluded. 4. Bilateral renal stones. 5. Aortic atherosclerosis (ICD10-I70.0). Coronary artery calcification. Electronically Signed   By: Lorin Picket M.D.   On: 07/22/2021 14:53    ASSESSMENT & PLAN Connor Adams 75 y.o. male with medical history significant for Stage I DLBCL who presents for a follow up visit.  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  At this time he has completed all planned chemotherapy treatment (R-EPOCH x 6 cycles).   On exam today Connor Adams continues to have good levels  of energy with no symptoms.  Unfortunately his most recent PET CT scan on 07/22/2021 again showed concern for FDG  avid nodes.  Previously we had a bronchoscopy performed to biopsy these lymph nodes, but there is no evidence of recurrence.  Given these findings we will repeat CT scan approximately 3 months time in order to monitor his lymph nodes.   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  --findings are consistent with a double hit DLBCL Treatment of choice is R-EPOCH chemotherapy in the inpatient setting. --he is currently s/p Cycle 6 administered on 09/08/2020 --patient has a port in place. TTE was performed and showed strong baseline heart function. --PET CT scan on 04/16/2021 showed concern for progressive disease in the chest. This was also seen on most recent scan from 07/22/2021. Prior biopsy showed granulomatous tissue with no evidence of lymphoma. At this time suspect the FDG avid areas are inflammatory and not lymphomatous.  --biopsy via bronchoscopy shows no clear evidence of recurrent disease.  Findings are most consistent with granulomatous, inflammatory tissue. --case previously discussed with Dr. Cassell Clement of [facility]. She recommendations if recurrence is found that we consider initiation of rituximab/polatuzumab. She will schedule an appointment to see if CAR-T therapy may be an option. -- Return to clinic following repeat CT scan in late Jan 2023.   #Lung Nodules, resolved -- noted on interim PET CT scan after Cycle 4.  --unclear if these represent areas of infection, metastatic disease, or progression of lymphoma --pulmonology consulted, appreciate their input.  --will monitor respiratory status closely  Orders Placed This Encounter  Procedures   CT CHEST ABDOMEN PELVIS W CONTRAST    Standing Status:   Future    Standing Expiration Date:   07/26/2022    Order Specific Question:   Preferred imaging location?    Answer:   [facility]    Order Specific Question:   Is Oral  Contrast requested for this exam?    Answer:   Yes, Per Radiology protocol    Order Specific Question:   Reason for Exam (SYMPTOM  OR DIAGNOSIS REQUIRED)    Answer:   lymphoma s/p treatment, assess for recurrence.    All questions were answered. The patient knows to call the clinic with any problems, questions or concerns.  A total of more than 30 minutes were spent on this encounter and over half of that time was spent on counseling and coordination of care as outlined above.   [name], MD Department of Hematology/Oncology [place] [facility] Phone: [phone] Pager: [phone] Email: [email or web address]  07/26/2021 1:37 PM

## 2021-08-03 DIAGNOSIS — K4090 Unilateral inguinal hernia, without obstruction or gangrene, not specified as recurrent: Secondary | ICD-10-CM | POA: Diagnosis not present

## 2021-08-24 ENCOUNTER — Telehealth: Payer: Self-pay | Admitting: Hematology and Oncology

## 2021-08-24 NOTE — Telephone Encounter (Signed)
Scheduled per sch msg. Called and spoke with patients wife. Confirmed appt  

## 2021-08-26 ENCOUNTER — Other Ambulatory Visit: Payer: Self-pay

## 2021-08-26 ENCOUNTER — Inpatient Hospital Stay: Payer: Medicare PPO | Attending: Hematology and Oncology

## 2021-08-26 DIAGNOSIS — Z452 Encounter for adjustment and management of vascular access device: Secondary | ICD-10-CM | POA: Insufficient documentation

## 2021-08-26 DIAGNOSIS — C8338 Diffuse large B-cell lymphoma, lymph nodes of multiple sites: Secondary | ICD-10-CM | POA: Diagnosis not present

## 2021-08-26 DIAGNOSIS — Z95828 Presence of other vascular implants and grafts: Secondary | ICD-10-CM

## 2021-08-26 MED ORDER — SODIUM CHLORIDE 0.9% FLUSH
10.0000 mL | Freq: Once | INTRAVENOUS | Status: AC
  Administered 2021-08-26: 10 mL

## 2021-08-26 MED ORDER — HEPARIN SOD (PORK) LOCK FLUSH 100 UNIT/ML IV SOLN
500.0000 [IU] | Freq: Once | INTRAVENOUS | Status: AC
  Administered 2021-08-26: 500 [IU]

## 2021-08-27 DIAGNOSIS — K4090 Unilateral inguinal hernia, without obstruction or gangrene, not specified as recurrent: Secondary | ICD-10-CM | POA: Diagnosis not present

## 2021-10-21 ENCOUNTER — Ambulatory Visit (HOSPITAL_COMMUNITY)
Admission: RE | Admit: 2021-10-21 | Discharge: 2021-10-21 | Disposition: A | Discharge status: Home / Self Care | Payer: Medicare PPO | Source: Ambulatory Visit | Attending: Hematology and Oncology | Admitting: Hematology and Oncology

## 2021-10-21 ENCOUNTER — Other Ambulatory Visit: Payer: Self-pay

## 2021-10-21 DIAGNOSIS — C8330 Diffuse large B-cell lymphoma, unspecified site: Secondary | ICD-10-CM | POA: Diagnosis not present

## 2021-10-21 DIAGNOSIS — C8333 Diffuse large B-cell lymphoma, intra-abdominal lymph nodes: Secondary | ICD-10-CM | POA: Insufficient documentation

## 2021-10-21 DIAGNOSIS — M4317 Spondylolisthesis, lumbosacral region: Secondary | ICD-10-CM | POA: Diagnosis not present

## 2021-10-21 DIAGNOSIS — N200 Calculus of kidney: Secondary | ICD-10-CM | POA: Diagnosis not present

## 2021-10-21 DIAGNOSIS — D7389 Other diseases of spleen: Secondary | ICD-10-CM | POA: Diagnosis not present

## 2021-10-21 DIAGNOSIS — K4090 Unilateral inguinal hernia, without obstruction or gangrene, not specified as recurrent: Secondary | ICD-10-CM | POA: Diagnosis not present

## 2021-10-21 DIAGNOSIS — I2510 Atherosclerotic heart disease of native coronary artery without angina pectoris: Secondary | ICD-10-CM | POA: Diagnosis not present

## 2021-10-21 DIAGNOSIS — M47814 Spondylosis without myelopathy or radiculopathy, thoracic region: Secondary | ICD-10-CM | POA: Diagnosis not present

## 2021-10-21 LAB — POCT I-STAT CREATININE: Creatinine, Ser: 1.4 mg/dL — ABNORMAL HIGH (ref 0.61–1.24)

## 2021-10-21 MED ORDER — IOHEXOL 300 MG/ML  SOLN
100.0000 mL | Freq: Once | INTRAMUSCULAR | Status: AC | PRN
  Administered 2021-10-21: 100 mL via INTRAVENOUS

## 2021-10-21 MED ORDER — SODIUM CHLORIDE (PF) 0.9 % IJ SOLN
INTRAMUSCULAR | Status: AC
  Filled 2021-10-21: qty 50

## 2021-10-23 ENCOUNTER — Inpatient Hospital Stay: Payer: Medicare PPO | Attending: Hematology and Oncology | Admitting: Hematology and Oncology

## 2021-10-23 ENCOUNTER — Inpatient Hospital Stay: Payer: Medicare PPO

## 2021-10-23 ENCOUNTER — Other Ambulatory Visit: Payer: Self-pay | Admitting: Hematology and Oncology

## 2021-10-23 ENCOUNTER — Other Ambulatory Visit: Payer: Self-pay

## 2021-10-23 ENCOUNTER — Other Ambulatory Visit: Payer: Medicare PPO

## 2021-10-23 VITALS — BP 170/82 | HR 53 | Temp 98.1°F | Resp 18 | Ht 68.0 in | Wt 190.3 lb

## 2021-10-23 DIAGNOSIS — E785 Hyperlipidemia, unspecified: Secondary | ICD-10-CM | POA: Diagnosis not present

## 2021-10-23 DIAGNOSIS — Z79899 Other long term (current) drug therapy: Secondary | ICD-10-CM | POA: Insufficient documentation

## 2021-10-23 DIAGNOSIS — Z8616 Personal history of COVID-19: Secondary | ICD-10-CM | POA: Diagnosis not present

## 2021-10-23 DIAGNOSIS — Z9221 Personal history of antineoplastic chemotherapy: Secondary | ICD-10-CM | POA: Diagnosis not present

## 2021-10-23 DIAGNOSIS — K219 Gastro-esophageal reflux disease without esophagitis: Secondary | ICD-10-CM | POA: Diagnosis not present

## 2021-10-23 DIAGNOSIS — R918 Other nonspecific abnormal finding of lung field: Secondary | ICD-10-CM

## 2021-10-23 DIAGNOSIS — C8333 Diffuse large B-cell lymphoma, intra-abdominal lymph nodes: Secondary | ICD-10-CM

## 2021-10-23 DIAGNOSIS — I129 Hypertensive chronic kidney disease with stage 1 through stage 4 chronic kidney disease, or unspecified chronic kidney disease: Secondary | ICD-10-CM | POA: Diagnosis not present

## 2021-10-23 DIAGNOSIS — Z85820 Personal history of malignant melanoma of skin: Secondary | ICD-10-CM | POA: Insufficient documentation

## 2021-10-23 DIAGNOSIS — Z95828 Presence of other vascular implants and grafts: Secondary | ICD-10-CM

## 2021-10-23 DIAGNOSIS — N183 Chronic kidney disease, stage 3 unspecified: Secondary | ICD-10-CM | POA: Diagnosis not present

## 2021-10-23 DIAGNOSIS — C8338 Diffuse large B-cell lymphoma, lymph nodes of multiple sites: Secondary | ICD-10-CM | POA: Insufficient documentation

## 2021-10-23 LAB — CBC WITH DIFFERENTIAL (CANCER CENTER ONLY)
Abs Immature Granulocytes: 0.01 10*3/uL (ref 0.00–0.07)
Basophils Absolute: 0.1 10*3/uL (ref 0.0–0.1)
Basophils Relative: 1 %
Eosinophils Absolute: 0.2 10*3/uL (ref 0.0–0.5)
Eosinophils Relative: 3 %
HCT: 41.4 % (ref 39.0–52.0)
Hemoglobin: 13.7 g/dL (ref 13.0–17.0)
Immature Granulocytes: 0 %
Lymphocytes Relative: 41 %
Lymphs Abs: 2.4 10*3/uL (ref 0.7–4.0)
MCH: 31.9 pg (ref 26.0–34.0)
MCHC: 33.1 g/dL (ref 30.0–36.0)
MCV: 96.3 fL (ref 80.0–100.0)
Monocytes Absolute: 0.4 10*3/uL (ref 0.1–1.0)
Monocytes Relative: 7 %
Neutro Abs: 2.9 10*3/uL (ref 1.7–7.7)
Neutrophils Relative %: 48 %
Platelet Count: 168 10*3/uL (ref 150–400)
RBC: 4.3 MIL/uL (ref 4.22–5.81)
RDW: 13.5 % (ref 11.5–15.5)
WBC Count: 5.9 10*3/uL (ref 4.0–10.5)
nRBC: 0 % (ref 0.0–0.2)

## 2021-10-23 LAB — CMP (CANCER CENTER ONLY)
ALT: 21 U/L (ref 0–44)
AST: 21 U/L (ref 15–41)
Albumin: 4.3 g/dL (ref 3.5–5.0)
Alkaline Phosphatase: 55 U/L (ref 38–126)
Anion gap: 6 (ref 5–15)
BUN: 23 mg/dL (ref 8–23)
CO2: 30 mmol/L (ref 22–32)
Calcium: 9.5 mg/dL (ref 8.9–10.3)
Chloride: 105 mmol/L (ref 98–111)
Creatinine: 1.33 mg/dL — ABNORMAL HIGH (ref 0.61–1.24)
GFR, Estimated: 56 mL/min — ABNORMAL LOW (ref >60)
Glucose, Bld: 97 mg/dL (ref 70–99)
Potassium: 4.1 mmol/L (ref 3.5–5.1)
Sodium: 141 mmol/L (ref 135–145)
Total Bilirubin: 0.5 mg/dL (ref 0.3–1.2)
Total Protein: 7 g/dL (ref 6.5–8.1)

## 2021-10-23 LAB — LACTATE DEHYDROGENASE: LDH: 141 U/L (ref 98–192)

## 2021-10-23 MED ORDER — SODIUM CHLORIDE 0.9% FLUSH
10.0000 mL | Freq: Once | INTRAVENOUS | Status: AC
  Administered 2021-10-23: 10 mL

## 2021-10-23 MED ORDER — HEPARIN SOD (PORK) LOCK FLUSH 100 UNIT/ML IV SOLN
500.0000 [IU] | Freq: Once | INTRAVENOUS | Status: AC
  Administered 2021-10-23: 500 [IU]

## 2021-10-23 NOTE — Progress Notes (Signed)
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   1) 04/17/2020:  CT A/P showed dominant nodal mass (11.5 x 10.9 cm) within the jejunal mesentery, highly suspicious for lymphoma. Additionally there are left lower lobe pleural-based pulmonary nodules 2) 04/24/2020: establish care with Dr. Lorenso Courier 3) 04/28/2020:  PET CT scan shows Large solid left mesenteric mass 13.0 cm with maximum SUV of 21.2, Deauville 5 with local hypermetabolic porta hepatis and retroperitoneal lymph nodes. Constitutes bulky Stage I disease.  4) 05/27/2020-05/31/2020: Cycle 1 of R-EPOCH 5) 06/16/2020-06/23/2020: Cycle 2 of R-EPOCH 6) 07/07/2020-07/11/2020: Cycle 3 of R-EPOCH  7) 07/28/2020-08/01/2020: Cycle 4 of R-EPOCH 8) 08/18/2020-08/22/2020: Cycle 5 of R-EPOCH 9) 09/08/2020-09/12/2020: Cycle 6 of R-EPOCH 10) 10/06/2020: PET CT scan shows decreased size of dominant mesenteric mass, however there was hypermetabolic activity within a single left anterior mesenteric lymph node  11) 12/31/2020: PET CT scan showed hypermetabolic nodular thickening the medial aspect of the RIGHT lower lobe is concerning for neoplasm versus atypical inflammatory response 12) 03/27/2021: PET CT Scan showed progression of disease compared with 12/30/2020. Multiple mediastinal and hilar lymph nodes exhibit new FDG uptake compatible with Deauville criteria 5. 13) 04/15/2021: bronchoscopy performed with biopsy of mediastinal lymph nodes.  Findings consistent with granulomatous disease.  No evidence of recurrence. 14) 07/22/2021: PET CT scan showed mediastinal, hilar, abdominal and inguinal lymph nodes, compatible with the provided history of lymphoma (overall Deauville 4). 15) 10/21/2021: CT scan shows no clear evidence of recurrence of lymphoma. Stable  previously note lymph nodes and lung nodules.   Interval History:  Connor Adams 76 y.o. male with medical history significant for Stage I DLBCL who presents for a follow up visit. The patient was last seen on 07/23/2021 .  In the interim since he underwent a CT scan which showed stable pulmonary nodules and lymph nodes.   On exam today Connor Adams is unaccompanied.  He reports that he has been feeling good and has great levels of energy.  He notes that he does have a hernia in his groin which is causing him some level of discomfort but that he is currently being evaluated by [facility] surgery for surgical intervention.  He notes that he is not having any systemic symptoms or no clear evidence of lymphadenopathy.  His wife unfortunately had to have her teeth removed recently and implants put in place.  She is having a lot of swelling in her face and discomfort.  He is doing his best to take care of her.  Otherwise he has no questions, concerns or complaints.  He currently denies having issues with fevers, chills, sweats, nausea, vomiting or diarrhea.  A full 10 point ROS is listed below.  MEDICAL HISTORY:  Past Medical History:  Diagnosis Date   Arthritis    Chronic kidney disease (CKD), stage III (moderate) (HCC)    COVID-19    GERD (gastroesophageal reflux disease)    occ   Hemorrhoids    History of ETT 3/05   low risk   History of hiatal hernia    History of kidney stones    HLD (hyperlipidemia)    HTN (hypertension)    Left inguinal hernia    Melanoma ([place])    excied   Sciatica    from lumbar disc disease   Stroke ([place])  2011   tia    TIA (transient ischemic attack) 4/11   associated w slurred speecha ndmild facial droop. lasted for about 10 minutes. Had MRI, etc with [facility] that per his report was ok (dr. Stephannie Li). Echo (4/11): EF 55-60%, no regional WMAs, normal diastolic function, mild LAE, no source of embolus    SURGICAL HISTORY: Past Surgical  History:  Procedure Laterality Date   BMI 30.2 muscular  12/08/04   BRONCHIAL BRUSHINGS  04/15/2021   Procedure: BRONCHIAL BRUSHINGS;  Surgeon: Margaretha Seeds, MD;  Location: [place];  Service: Pulmonary;;   BRONCHIAL NEEDLE ASPIRATION BIOPSY  04/15/2021   Procedure: BRONCHIAL NEEDLE ASPIRATION BIOPSIES;  Surgeon: Margaretha Seeds, MD;  Location: [facility] ENDOSCOPY;  Service: Pulmonary;;   curretage actinic keratosis R ear  01/20/05   ENDOBRONCHIAL ULTRASOUND N/A 04/15/2021   Procedure: ENDOBRONCHIAL ULTRASOUND;  Surgeon: Margaretha Seeds, MD;  Location: [place];  Service: Pulmonary;  Laterality: N/A;   ETT low prob of CAD  11/21/03   excision melanoma in situ back  01/20/05   hemorrhoids sclerosed at colonoscopy  12/19/05   IR IMAGING GUIDED PORT INSERTION  05/19/2020   LAPAROSCOPIC INGUINAL HERNIA REPAIR Left 12/20/1995   LUMBAR DISC SURGERY     NERVE REPAIR     back of neck   retinal laser surgery Left 09/20/1986   SEPTOPLASTY  11/19/98   TOTAL KNEE ARTHROPLASTY Left 12/13/2016   Procedure: TOTAL KNEE ARTHROPLASTY WITH RIGHT KNEE CORTISONE INJECTION;  Surgeon: Frederik Pear, MD;  Location: [place];  Service: Orthopedics;  Laterality: Left;   uric acid 6.9  12/22/04   x-ray l great toe  07/21/00    SOCIAL HISTORY: Social History   Socioeconomic History   Marital status: Married    Spouse name: [name]   Number of children: 2   Years of education: 12   Highest education level: Not on file  Occupational History   Occupation: Systems developer: [employer]  Tobacco Use   Smoking status: Never   Smokeless tobacco: Never  Vaping Use   Vaping Use: Never used  Substance and Sexual Activity   Alcohol use: No   Drug use: No   Sexual activity: Yes  Other Topics Concern   Not on file  Social History Narrative   Married to [employer] at [employer].   Son, [name] married   Son, [name], Married.    Caffeine use: 1 cup coffee per day   Social Determinants of  Health   Financial Resource Strain: Not on file  Food Insecurity: Not on file  Transportation Needs: Not on file  Physical Activity: Not on file  Stress: Not on file  Social Connections: Not on file  Intimate Partner Violence: Not on file    FAMILY HISTORY: Family History  Problem Relation Age of Onset   Liver cancer Sister    Heart failure Brother 61       CABGx4   Heart attack Mother 12   Uterine cancer Mother    Heart attack Father 42   Asthma Son    Diabetes Brother    Prostate cancer Brother     ALLERGIES:  has No Known Allergies.  MEDICATIONS:  Current Outpatient Medications  Medication Sig Dispense Refill   acetaminophen (TYLENOL) 325 MG tablet Take 2 tablets (650 mg total) by mouth every 6 (six) hours as needed for mild pain, moderate pain or fever.     albuterol (  VENTOLIN HFA) 108 (90 Base) MCG/ACT inhaler Inhale 1-2 puffs into the lungs every 6 (six) hours as needed for wheezing or shortness of breath.     amLODipine (NORVASC) 5 MG tablet Take 5 mg by mouth in the morning.     aspirin EC 81 MG tablet Take 81 mg by mouth in the morning. Swallow whole.     fluticasone (FLONASE) 50 MCG/ACT nasal spray Place 1-2 sprays into both nostrils daily as needed for allergies or rhinitis.  2   hydrocortisone (ANUSOL-HC) 2.5 % rectal cream Place 1 application rectally daily as needed. (Patient taking differently: Place 1 application rectally daily as needed for hemorrhoids or anal itching.) 30 g 0   indomethacin (INDOCIN) 50 MG capsule Take 50 mg by mouth 3 (three) times daily as needed (GOUT).     Multiple Vitamin (MULTIVITAMIN WITH MINERALS) TABS tablet Take 1 tablet by mouth in the morning.     Omega-3 Fatty Acids (FISH OIL) 600 MG CAPS Take 600 mg by mouth in the morning.     polyvinyl alcohol (LIQUIFILM TEARS) 1.4 % ophthalmic solution Place 1 drop into both eyes in the morning.     rosuvastatin (CRESTOR) 5 MG tablet Take 5 mg by mouth in the morning.     No current  facility-administered medications for this visit.    REVIEW OF SYSTEMS:   Constitutional: ( - ) fevers, ( - )  chills , ( - ) night sweats Eyes: ( - ) blurriness of vision, ( - ) double vision, ( - ) watery eyes Ears, nose, mouth, throat, and face: ( - ) mucositis, ( - ) sore throat Respiratory: ( - ) cough, ( - ) dyspnea, ( - ) wheezes Cardiovascular: ( - ) palpitation, ( - ) chest discomfort, ( - ) lower extremity swelling Gastrointestinal:  ( - ) nausea, ( - ) heartburn, ( - ) change in bowel habits Skin: ( - ) abnormal skin rashes Lymphatics: ( - ) new lymphadenopathy, ( - ) easy bruising Neurological: ( - ) numbness, ( - ) tingling, ( - ) new weaknesses Behavioral/Psych: ( - ) mood change, ( - ) new changes  All other systems were reviewed with the patient and are negative.  PHYSICAL EXAMINATION: ECOG PERFORMANCE STATUS: 1 - Symptomatic but completely ambulatory  Vitals:   10/23/21 1033  BP: (!) 170/82  Pulse: (!) 53  Resp: 18  Temp: 98.1 F (36.7 C)  SpO2: 100%    Filed Weights   10/23/21 1033  Weight: 190 lb 4.8 oz (86.3 kg)    GENERAL: well appearing elderly Caucasian male. alert, no distress and comfortable SKIN: skin color, texture, turgor are normal, no rashes or significant lesions.  EYES: conjunctiva are pink and non-injected, sclera clear LUNGS: clear to auscultation and percussion with normal breathing effort HEART: regular rate & rhythm and no murmurs and trace lower extremity edema Musculoskeletal: no cyanosis of digits and no clubbing  PSYCH: alert & oriented x 3, fluent speech NEURO: no focal motor/sensory deficits  LABORATORY DATA:  I have reviewed the data as listed CBC Latest Ref Rng & Units 10/23/2021 07/23/2021 04/30/2021  WBC 4.0 - 10.5 K/uL 5.9 6.4 7.4  Hemoglobin 13.0 - 17.0 g/dL 13.7 13.3 12.8(L)  Hematocrit 39.0 - 52.0 % 41.4 40.8 37.8(L)  Platelets 150 - 400 K/uL 168 167 176    CMP Latest Ref Rng & Units 10/23/2021 10/21/2021 07/23/2021   Glucose 70 - 99 mg/dL 97 - 136(H)  BUN 8 -  23 mg/dL 23 - 21  Creatinine 0.61 - 1.24 mg/dL 1.33(H) 1.40(H) 1.35(H)  Sodium 135 - 145 mmol/L 141 - 143  Potassium 3.5 - 5.1 mmol/L 4.1 - 4.1  Chloride 98 - 111 mmol/L 105 - 107  CO2 22 - 32 mmol/L 30 - 27  Calcium 8.9 - 10.3 mg/dL 9.5 - 9.2  Total Protein 6.5 - 8.1 g/dL 7.0 - 7.0  Total Bilirubin 0.3 - 1.2 mg/dL 0.5 - 0.5  Alkaline Phos 38 - 126 U/L 55 - 62  AST 15 - 41 U/L 21 - 24  ALT 0 - 44 U/L 21 - 22    RADIOGRAPHIC STUDIES: I have personally reviewed the radiological images as listed and agreed with the findings in the report: large abdominal mass markedly decreased in FDG avidity.  Increased brightness of lymph nodes in the chest, concerning for progression.  CT CHEST ABDOMEN PELVIS W CONTRAST  Result Date: 10/23/2021 CLINICAL DATA:  Restaging diffuse large B-cell lymphoma. EXAM: CT CHEST, ABDOMEN, AND PELVIS WITH CONTRAST TECHNIQUE: Multidetector CT imaging of the chest, abdomen and pelvis was performed following the standard protocol during bolus administration of intravenous contrast. RADIATION DOSE REDUCTION: This exam was performed according to the departmental dose-optimization program which includes automated exposure control, adjustment of the mA and/or kV according to patient size and/or use of iterative reconstruction technique. CONTRAST:  166mL OMNIPAQUE IOHEXOL 300 MG/ML  SOLN COMPARISON:  Multiple exams, including nuclear medicine PET-CT 07/22/2021 FINDINGS: CT CHEST FINDINGS Cardiovascular: Right Port-A-Cath tip: Right atrium. Coronary, aortic arch, and branch vessel atherosclerotic vascular disease. Mediastinum/Nodes: Right hilar lymph node 1.2 cm in short axis on image 30 series 2 formerly approximally the same on 07/22/2021. AP window lymph node 1.3 cm in short axis on image 26 series 2, formerly same. Prevascular node 0.4 cm in short axis on image 25 series 2, formerly 0.6 cm. Subcarinal node 0.8 cm in short axis on image 30  series 2, formerly 1.0 cm. Lungs/Pleura: Stable mild subpleural nodularity along the fissures. Left lower lobe nodule anteriorly on image 135 series 6, 1.4 by 1.0 cm, formerly 1.4 by 1.2 cm. Left lower lobe nodule on image 122 series 6 measures 1.2 by 0.9 cm, formerly 1.2 by 1.0 cm. A left lower lobe nodule on image 98 series 6 measures 0.9 by 0.9 cm, formerly the same. Other smaller clustered right lower lobe nodules appear similar to previous. 6 by 5 mm left upper lobe subpleural nodule along the mediastinal margin on image 39 series 6, stable. 0.6 by 0.4 cm left upper lobe nodule on image 50 series 6, stable. Lingular subpleural nodule 0.9 by 0.6 cm, stable. Other small nodules which are mainly subpleural location likewise appear stable. Musculoskeletal: Old healed left posterior seventh and eighth rib fractures. Thoracic spondylosis. CT ABDOMEN PELVIS FINDINGS Hepatobiliary: Punctate hepatic calcifications compatible with old granulomatous disease. Gallbladder appears unremarkable. No biliary dilatation. Pancreas: Unremarkable Spleen: Punctate calcifications compatible with old granulomatous disease. Adrenals/Urinary Tract: Multiple bilateral punctate nonobstructive renal calculi. No hydronephrosis or hydroureter. No ureteral calculus or bladder calculus observed. Small hypodense lesions of the left kidney are technically too small to characterize although statistically likely to be benign. Minimal left adrenal thickening without a well-defined adrenal mass. There is low-level activity in this vicinity on the prior PET-CT. Stomach/Bowel: Prominent stool throughout the colon favors constipation. Small periampullary duodenal diverticulum. Indirect right inguinal hernia contains small bowel without evidence of strangulation or obstruction. Suspected left inguinal hernia repair. Vascular/Lymphatic: Atherosclerosis is present, including aortoiliac atherosclerotic  disease. Hazy indistinct mesenteric lymph node along  the inferior margin of the mesenteric stranding measures 0.8 cm in short axis on image 83 series 2, formerly 0.9 cm. Portacaval node 1.1 cm in short axis on image 67 series 2, formerly the same by my measurements. Right inguinal lymph node 0.8 cm in short axis on image 122 series 2, formerly 1.0 cm by my measurements. Reproductive: Unremarkable Other: Stable hazy stranding along the jejunal mesentery, likely related to lymphoma. Musculoskeletal: Right inguinal hernia contains small bowel. 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 This contributes to bilateral foraminal impingement at L5-S1. There is also right foraminal impingement at L4-5. IMPRESSION: 1. Some of the mediastinal and abdominal lymph nodes are mildly reduced in size compared to 07/22/2021. Others are stable. 2. The bilateral pulmonary nodules are essentially stable. Likewise the jejunal mesenteric stranding likely related to prior lymphoma is stable. 3. Indirect right inguinal hernia contains small bowel without evidence of strangulation or obstruction. 4. Other imaging findings of potential clinical significance: Aortic Atherosclerosis (ICD10-I70.0). Systemic atherosclerosis. Coronary atherosclerosis. Old granulomatous disease. Multiple bilateral punctate nonobstructive renal calculi. Stable minimal left adrenal thickening without overt mass. Prominent stool throughout the colon favors constipation. Chronic pars defects at L5, bilateral foraminal impingement at L5-S1 and right foraminal impingement at L4-5. Electronically Signed   By: Van Clines M.D.   On: 10/23/2021 12:21    ASSESSMENT & PLAN Connor Adams 76 y.o. male with medical history significant for Stage I DLBCL who presents for a follow up visit.  After review the labs, review the imaging, review the pathology and discussion with the patient the findings are most consistent with a stage I double hit diffuse large B-cell  lymphoma predominantly involving the lymph nodes of the abdomen.  At this time he has completed all planned chemotherapy treatment (R-EPOCH x 6 cycles).   On exam today Mr. Schauer continues to have good levels of energy with no symptoms.  Unfortunately his most recent PET CT scan on 07/22/2021 again showed concern for FDG avid nodes.  Previously we had a bronchoscopy performed to biopsy these lymph nodes, but there is no evidence of recurrence.  Given these findings we will repeat CT scan approximately 3 months time in order to monitor his lymph nodes.   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  --findings are consistent with a double hit DLBCL Treatment of choice is R-EPOCH chemotherapy in the inpatient setting. --he is currently s/p Cycle 6 administered on 09/08/2020 --patient has a port in place. TTE was performed and showed strong baseline heart function. --PET CT scan on 04/16/2021 showed concern for progressive disease in the chest. This was also seen on most recent scan from 07/22/2021. Prior biopsy showed granulomatous tissue with no evidence of lymphoma. At this time suspect the FDG avid areas are inflammatory and not lymphomatous.  --biopsy via bronchoscopy shows no clear evidence of recurrent disease.  Findings are most consistent with granulomatous, inflammatory tissue. --case previously discussed with Dr. Cassell Clement of [facility]. She recommendations if recurrence is found that we consider initiation of rituximab/polatuzumab. She will schedule an appointment to see if CAR-T therapy may be an option. Plan:  -- routine CBC, CMP and LDH at each clinic visit. Labs today show WBC 5.9, Hgb 13.7, MCV 96.3, Plt 168 --continue q 3 week follow  up and labs until Dec 2023, at which time we can transition to q 6 month clinic visits  -- Return to clinic following repeat CT scan in late April 2023.    #Lung Nodules, stable -- noted on interim PET CT scan after Cycle 4.  --unclear if these represent areas of infection, metastatic disease, or progression of lymphoma --pulmonology consulted, appreciate their input.  --will monitor respiratory status closely  # Port in Place -patient has a port in place. Could be removed during hernia repair, but if not feasible we can have it removed by IR sometime after the hernia surgery in March 2023.   No orders of the defined types were placed in this encounter.   All questions were answered. The patient knows to call the clinic with any problems, questions or concerns.  A total of more than 30 minutes were spent on this encounter and over half of that time was spent on counseling and coordination of care as outlined above.   [name], MD Department of Hematology/Oncology [place] [facility] Phone: [phone] Pager: [phone] Email: [email or web address]  10/23/2021 2:01 PM

## 2021-10-26 ENCOUNTER — Telehealth: Payer: Self-pay | Admitting: Hematology and Oncology

## 2021-10-26 NOTE — Telephone Encounter (Signed)
Scheduled per 2/3 los, pt has been called and confirmed appt

## 2021-11-02 ENCOUNTER — Ambulatory Visit: Payer: Self-pay | Admitting: Surgery

## 2021-11-16 ENCOUNTER — Other Ambulatory Visit: Payer: Self-pay

## 2021-11-16 ENCOUNTER — Encounter (HOSPITAL_BASED_OUTPATIENT_CLINIC_OR_DEPARTMENT_OTHER): Payer: Self-pay | Admitting: Surgery

## 2021-11-16 NOTE — Progress Notes (Signed)
Spoke w/ via phone for pre-op interview: patient Lab needs dos: EKG Lab results: CMP and CBCD 10/23/21; Echo 03/05/21 COVID test: patient states asymptomatic no test needed. Arrive at 0900 NPO after MN except clear liquids.Clear liquids from MN until 0800 am Med rec completed. Medications to take morning of surgery: amlodipine and loratadine; Albuterol PRN;  Diabetic medication: NA Patient instructed to bring photo id and insurance card day of surgery. Patient aware to have Driver (ride ) / caregiver for 24 hours after surgery. Wife to drive. Patient Special Instructions: NA Pre-Op special Istructions: NA Patient verbalized understanding of instructions that were given at this phone interview. Patient denies shortness of breath, chest pain, fever, cough at this phone interview.

## 2021-11-19 NOTE — Anesthesia Preprocedure Evaluation (Addendum)
Anesthesia Evaluation  ?Patient identified by MRN, date of birth, ID band ?Patient awake ? ? ? ?Reviewed: ?Allergy & Precautions, NPO status , Patient's Chart, lab work & pertinent test results ? ?Airway ?Mallampati: II ? ?TM Distance: >3 FB ?Neck ROM: Full ? ? ? Dental ?no notable dental hx. ?(+) Teeth Intact, Dental Advisory Given ?  ?Pulmonary ?sleep apnea and Continuous Positive Airway Pressure Ventilation , pneumonia (hx of lymphmoma),  ?  ?Pulmonary exam normal ?breath sounds clear to auscultation ? ? ? ? ? ? Cardiovascular ?hypertension, Normal cardiovascular exam ?Rhythm:Regular Rate:Normal ? ? ?  ?Neuro/Psych ?CVA, No Residual Symptoms   ? GI/Hepatic ?  ?Endo/Other  ?Morbid obesity ? Renal/GU ?Renal diseaseKidney Stones hx of ?Lab Results ?     Component                Value               Date                 ?     CREATININE               1.33 (H)            10/23/2021          ?     NA                       141                 10/23/2021           ?     K                        4.1                 10/23/2021           ?     CL                       105                 10/23/2021           ?     CO2                      30                  10/23/2021           ?  ? ?  ?Musculoskeletal ? ?(+) Arthritis ,  ? Abdominal ?  ?Peds ? Hematology ?Lab Results ?     Component                Value               Date                 ?     WBC                      5.9                 10/23/2021           ?     HGB  13.7                10/23/2021           ?     HCT                      41.4                10/23/2021           ?     MCV                      96.3                10/23/2021           ?     PLT                      168                 10/23/2021           ?   ?Anesthesia Other Findings ? ? Reproductive/Obstetrics ? ?  ? ? ? ? ? ? ? ? ? ? ? ? ? ?  ?  ? ? ? ? ? ? ? ?Anesthesia Physical ?Anesthesia Plan ? ?ASA: 3 ? ?Anesthesia Plan: General  ? ?Post-op Pain  Management: Tylenol PO (pre-op)*  ? ?Induction: Intravenous ? ?PONV Risk Score and Plan: Treatment may vary due to age or medical condition, Ondansetron and Midazolam ? ?Airway Management Planned: Oral ETT ? ?Additional Equipment: None ? ?Intra-op Plan:  ? ?Post-operative Plan: Extubation in OR ? ?Informed Consent: I have reviewed the patients History and Physical, chart, labs and discussed the procedure including the risks, benefits and alternatives for the proposed anesthesia with the patient or authorized representative who has indicated his/her understanding and acceptance.  ? ? ? ?Dental advisory given ? ?Plan Discussed with: CRNA and Anesthesiologist ? ?Anesthesia Plan Comments:   ? ? ? ? ? ?Anesthesia Quick Evaluation ? ?

## 2021-11-20 ENCOUNTER — Other Ambulatory Visit: Payer: Self-pay

## 2021-11-20 ENCOUNTER — Ambulatory Visit (HOSPITAL_BASED_OUTPATIENT_CLINIC_OR_DEPARTMENT_OTHER): Payer: Medicare PPO | Admitting: Anesthesiology

## 2021-11-20 ENCOUNTER — Ambulatory Visit (HOSPITAL_BASED_OUTPATIENT_CLINIC_OR_DEPARTMENT_OTHER)
Admission: RE | Admit: 2021-11-20 | Discharge: 2021-11-20 | Disposition: A | Discharge status: Home / Self Care | Payer: Medicare PPO | Attending: Surgery | Admitting: Surgery

## 2021-11-20 ENCOUNTER — Encounter (HOSPITAL_BASED_OUTPATIENT_CLINIC_OR_DEPARTMENT_OTHER)
Admission: RE | Disposition: A | Discharge status: Home / Self Care | Payer: Self-pay | Source: Home / Self Care | Attending: Surgery

## 2021-11-20 ENCOUNTER — Encounter (HOSPITAL_BASED_OUTPATIENT_CLINIC_OR_DEPARTMENT_OTHER): Payer: Self-pay | Admitting: Surgery

## 2021-11-20 DIAGNOSIS — I10 Essential (primary) hypertension: Secondary | ICD-10-CM

## 2021-11-20 DIAGNOSIS — K4090 Unilateral inguinal hernia, without obstruction or gangrene, not specified as recurrent: Secondary | ICD-10-CM

## 2021-11-20 DIAGNOSIS — G4733 Obstructive sleep apnea (adult) (pediatric): Secondary | ICD-10-CM

## 2021-11-20 DIAGNOSIS — Z6830 Body mass index (BMI) 30.0-30.9, adult: Secondary | ICD-10-CM | POA: Diagnosis not present

## 2021-11-20 DIAGNOSIS — G4730 Sleep apnea, unspecified: Secondary | ICD-10-CM | POA: Diagnosis not present

## 2021-11-20 DIAGNOSIS — N183 Chronic kidney disease, stage 3 unspecified: Secondary | ICD-10-CM | POA: Diagnosis not present

## 2021-11-20 DIAGNOSIS — I129 Hypertensive chronic kidney disease with stage 1 through stage 4 chronic kidney disease, or unspecified chronic kidney disease: Secondary | ICD-10-CM | POA: Insufficient documentation

## 2021-11-20 DIAGNOSIS — Z9989 Dependence on other enabling machines and devices: Secondary | ICD-10-CM

## 2021-11-20 HISTORY — PX: INGUINAL HERNIA REPAIR: SHX194

## 2021-11-20 SURGERY — REPAIR, HERNIA, INGUINAL, LAPAROSCOPIC
Anesthesia: General | Laterality: Right

## 2021-11-20 MED ORDER — ACETAMINOPHEN 500 MG PO TABS
1000.0000 mg | ORAL_TABLET | ORAL | Status: AC
  Administered 2021-11-20: 1000 mg via ORAL

## 2021-11-20 MED ORDER — ACETAMINOPHEN 500 MG PO TABS
ORAL_TABLET | ORAL | Status: AC
Start: 2021-11-20 — End: ?
  Filled 2021-11-20: qty 2

## 2021-11-20 MED ORDER — DEXMEDETOMIDINE (PRECEDEX) IN NS 20 MCG/5ML (4 MCG/ML) IV SYRINGE
PREFILLED_SYRINGE | INTRAVENOUS | Status: AC
  Filled 2021-11-20: qty 5

## 2021-11-20 MED ORDER — OXYCODONE-ACETAMINOPHEN 5-325 MG PO TABS: 1.0000 | ORAL_TABLET | ORAL | 0 refills | Status: DC | PRN

## 2021-11-20 MED ORDER — GLYCOPYRROLATE PF 0.2 MG/ML IJ SOSY
PREFILLED_SYRINGE | INTRAMUSCULAR | Status: AC
  Filled 2021-11-20: qty 1

## 2021-11-20 MED ORDER — CHLORHEXIDINE GLUCONATE CLOTH 2 % EX PADS
6.0000 | MEDICATED_PAD | Freq: Once | CUTANEOUS | Status: DC
Start: 2021-11-20 — End: 2021-11-20

## 2021-11-20 MED ORDER — CELECOXIB 200 MG PO CAPS
400.0000 mg | ORAL_CAPSULE | ORAL | Status: AC
  Administered 2021-11-20: 400 mg via ORAL

## 2021-11-20 MED ORDER — BUPIVACAINE HCL 0.5 % IJ SOLN
INTRAMUSCULAR | Status: DC | PRN
  Administered 2021-11-20: 30 mL

## 2021-11-20 MED ORDER — PROPOFOL 10 MG/ML IV BOLUS
INTRAVENOUS | Status: DC | PRN
  Administered 2021-11-20: 110 mg via INTRAVENOUS
  Administered 2021-11-20: 30 mg via INTRAVENOUS

## 2021-11-20 MED ORDER — DEXAMETHASONE SODIUM PHOSPHATE 10 MG/ML IJ SOLN
INTRAMUSCULAR | Status: DC | PRN
  Administered 2021-11-20: 4 mg via INTRAVENOUS

## 2021-11-20 MED ORDER — DEXAMETHASONE SODIUM PHOSPHATE 10 MG/ML IJ SOLN
INTRAMUSCULAR | Status: AC
  Filled 2021-11-20: qty 1

## 2021-11-20 MED ORDER — LACTATED RINGERS IV SOLN: INTRAVENOUS | Status: DC

## 2021-11-20 MED ORDER — FENTANYL CITRATE (PF) 100 MCG/2ML IJ SOLN: 25.0000 ug | INTRAMUSCULAR | Status: DC | PRN

## 2021-11-20 MED ORDER — LIDOCAINE 2% (20 MG/ML) 5 ML SYRINGE
INTRAMUSCULAR | Status: DC | PRN
  Administered 2021-11-20: 80 mg via INTRAVENOUS

## 2021-11-20 MED ORDER — FENTANYL CITRATE (PF) 100 MCG/2ML IJ SOLN
INTRAMUSCULAR | Status: DC | PRN
  Administered 2021-11-20: 25 ug via INTRAVENOUS
  Administered 2021-11-20: 50 ug via INTRAVENOUS

## 2021-11-20 MED ORDER — ROCURONIUM BROMIDE 10 MG/ML (PF) SYRINGE
PREFILLED_SYRINGE | INTRAVENOUS | Status: AC
  Filled 2021-11-20: qty 10

## 2021-11-20 MED ORDER — CHLORHEXIDINE GLUCONATE CLOTH 2 % EX PADS: 6.0000 | MEDICATED_PAD | Freq: Once | CUTANEOUS | Status: DC

## 2021-11-20 MED ORDER — CEFAZOLIN SODIUM-DEXTROSE 2-4 GM/100ML-% IV SOLN
INTRAVENOUS | Status: AC
  Filled 2021-11-20: qty 100

## 2021-11-20 MED ORDER — BUPIVACAINE LIPOSOME 1.3 % IJ SUSP
INTRAMUSCULAR | Status: DC | PRN
  Administered 2021-11-20: 20 mL

## 2021-11-20 MED ORDER — DEXMEDETOMIDINE (PRECEDEX) IN NS 20 MCG/5ML (4 MCG/ML) IV SYRINGE
PREFILLED_SYRINGE | INTRAVENOUS | Status: DC | PRN
  Administered 2021-11-20: 4 ug via INTRAVENOUS
  Administered 2021-11-20: 8 ug via INTRAVENOUS

## 2021-11-20 MED ORDER — BUPIVACAINE LIPOSOME 1.3 % IJ SUSP: 20.0000 mL | Freq: Once | INTRAMUSCULAR | Status: DC

## 2021-11-20 MED ORDER — ONDANSETRON HCL 4 MG/2ML IJ SOLN: 4.0000 mg | Freq: Once | INTRAMUSCULAR | Status: DC | PRN

## 2021-11-20 MED ORDER — SUGAMMADEX SODIUM 200 MG/2ML IV SOLN
INTRAVENOUS | Status: DC | PRN
  Administered 2021-11-20: 200 mg via INTRAVENOUS

## 2021-11-20 MED ORDER — FENTANYL CITRATE (PF) 100 MCG/2ML IJ SOLN
INTRAMUSCULAR | Status: AC
  Filled 2021-11-20: qty 2

## 2021-11-20 MED ORDER — CELECOXIB 200 MG PO CAPS
ORAL_CAPSULE | ORAL | Status: AC
  Filled 2021-11-20: qty 2

## 2021-11-20 MED ORDER — ONDANSETRON HCL 4 MG/2ML IJ SOLN
INTRAMUSCULAR | Status: DC | PRN
  Administered 2021-11-20: 4 mg via INTRAVENOUS

## 2021-11-20 MED ORDER — GLYCOPYRROLATE 0.2 MG/ML IJ SOLN
INTRAMUSCULAR | Status: DC | PRN
  Administered 2021-11-20: .2 mg via INTRAVENOUS

## 2021-11-20 MED ORDER — LIDOCAINE HCL (PF) 2 % IJ SOLN
INTRAMUSCULAR | Status: AC
  Filled 2021-11-20: qty 5

## 2021-11-20 MED ORDER — CEFAZOLIN SODIUM-DEXTROSE 2-4 GM/100ML-% IV SOLN
2.0000 g | INTRAVENOUS | Status: AC
  Administered 2021-11-20: 2 g via INTRAVENOUS

## 2021-11-20 MED ORDER — ROCURONIUM BROMIDE 10 MG/ML (PF) SYRINGE
PREFILLED_SYRINGE | INTRAVENOUS | Status: DC | PRN
  Administered 2021-11-20: 60 mg via INTRAVENOUS

## 2021-11-20 MED ORDER — PROPOFOL 500 MG/50ML IV EMUL
INTRAVENOUS | Status: AC
  Filled 2021-11-20: qty 50

## 2021-11-20 MED ORDER — ONDANSETRON HCL 4 MG/2ML IJ SOLN
INTRAMUSCULAR | Status: AC
  Filled 2021-11-20: qty 2

## 2021-11-20 MED ORDER — ACETAMINOPHEN 10 MG/ML IV SOLN: 1000.0000 mg | Freq: Once | INTRAVENOUS | Status: DC | PRN

## 2021-11-20 SURGICAL SUPPLY — 37 items
ADH SKN CLS APL DERMABOND .7 (GAUZE/BANDAGES/DRESSINGS) ×1
APL PRP STRL LF DISP 70% ISPRP (MISCELLANEOUS) ×1
BLADE CLIPPER SENSICLIP SURGIC (BLADE) ×1 IMPLANT
CABLE HIGH FREQUENCY MONO STRZ (ELECTRODE) ×2 IMPLANT
CHLORAPREP W/TINT 26 (MISCELLANEOUS) ×2 IMPLANT
DERMABOND ADVANCED (GAUZE/BANDAGES/DRESSINGS) ×1
DERMABOND ADVANCED .7 DNX12 (GAUZE/BANDAGES/DRESSINGS) ×1 IMPLANT
ELECT REM PT RETURN 9FT ADLT (ELECTROSURGICAL) ×2
ELECTRODE REM PT RTRN 9FT ADLT (ELECTROSURGICAL) ×1 IMPLANT
GAUZE 4X4 16PLY ~~LOC~~+RFID DBL (SPONGE) ×2 IMPLANT
GLOVE SRG 8 PF TXTR STRL LF DI (GLOVE) ×1 IMPLANT
GLOVE SURG ENC MOIS LTX SZ7.5 (GLOVE) ×2 IMPLANT
GLOVE SURG ENC TEXT LTX SZ7 (GLOVE) ×2 IMPLANT
GLOVE SURG UNDER POLY LF SZ8 (GLOVE) ×2
GOWN STRL REUS W/ TWL LRG LVL3 (GOWN DISPOSABLE) IMPLANT
GOWN STRL REUS W/ TWL XL LVL3 (GOWN DISPOSABLE) ×1 IMPLANT
GOWN STRL REUS W/TWL LRG LVL3 (GOWN DISPOSABLE) ×6
GOWN STRL REUS W/TWL XL LVL3 (GOWN DISPOSABLE) ×4
GRASPER SUT TROCAR 14GX15 (MISCELLANEOUS) ×2 IMPLANT
KIT TURNOVER CYSTO (KITS) ×2 IMPLANT
MESH 3DMAX 5X7 RT XLRG (Mesh General) ×1 IMPLANT
NEEDLE INSUFFLATION 120MM (ENDOMECHANICALS) ×2 IMPLANT
PACK BASIN DAY SURGERY FS (CUSTOM PROCEDURE TRAY) ×2 IMPLANT
PAD POSITIONING PINK XL (MISCELLANEOUS) ×2 IMPLANT
RELOAD STAPLE 4.0 BLU F/HERNIA (INSTRUMENTS) IMPLANT
RELOAD STAPLE 4.8 BLK F/HERNIA (STAPLE) IMPLANT
RELOAD STAPLE HERNIA 4.0 BLUE (INSTRUMENTS) ×2 IMPLANT
RELOAD STAPLE HERNIA 4.8 BLK (STAPLE) ×2 IMPLANT
SCISSORS LAP 5X35 DISP (ENDOMECHANICALS) ×2 IMPLANT
SET TUBE SMOKE EVAC HIGH FLOW (TUBING) ×2 IMPLANT
STAPLER HERNIA 12 8.5 360D (INSTRUMENTS) ×1 IMPLANT
SUT MNCRL AB 4-0 PS2 18 (SUTURE) ×3 IMPLANT
SUT VICRYL 0 UR6 27IN ABS (SUTURE) ×1 IMPLANT
TOWEL OR 17X26 10 PK STRL BLUE (TOWEL DISPOSABLE) ×2 IMPLANT
TRAY LAPAROSCOPIC (CUSTOM PROCEDURE TRAY) ×2 IMPLANT
TROCAR BLADELESS OPT 12M 100M (ENDOMECHANICALS) ×2 IMPLANT
TROCAR BLADELESS OPT 5 100 (ENDOMECHANICALS) ×4 IMPLANT

## 2021-11-20 NOTE — Discharge Instructions (Addendum)
 GROIN HERNIA REPAIR POST OPERATIVE INSTRUCTIONS  Thinking Clearly  The anesthesia may cause you to feel different for 1 or 2 days. Do not drive, drink alcohol, or make any big decisions for at least 2 days.  Nutrition When you wake up, you will be able to drink small amounts of liquid. If you do not feel sick, you can slowly advance your diet to regular foods. Continue to drink lots of fluids, usually about 8 to 10 glasses per day. Eat a high-fiber diet so you don't strain during bowel movements. High-Fiber Foods Foods high in fiber include beans, bran cereals and whole-grain breads, peas, dried fruit (figs, apricots, and dates), raspberries, blackberries, strawberries, sweet corn, broccoli, baked potatoes with skin, plums, pears, apples, greens, and nuts. Activity Slowly increase your activity. Be sure to get up and walk every hour or so to prevent blood clots. No heavy lifting or strenuous activity for 4 weeks following surgery to prevent hernias at your incision sites or recurrence of your hernia. It is normal to feel tired. You may need more sleep than usual.  Get your rest but make sure to get up and move around frequently to prevent blood clots and pneumonia.  Work and Return to School You can go back to work when you feel well enough. Discuss the timing with your surgeon. You can usually go back to school or work 1 week or less after an laparoscopic or an open repair. If your work requires heavy lifting or strenuous activity you need to be placed on light duty for 4 weeks following surgery. You can return to gym class, sports or other physical activities 4 weeks after surgery.  Wound Care You may experience significant bruising in the groin including into the scrotum in males.  Rest, elevating the groin and scrotum above the level of the heart, ice and compression with tight fitting underwear can help.  Always wash your hands before and after touching near your incision site. Do  not soak in a bathtub until cleared at your follow up appointment. You may take a shower 24 hours after surgery. A small amount of drainage from the incision is normal. If the drainage is thick and yellow or the site is red, you may have an infection, so call your surgeon. If you have a drain in one of your incisions, it will be taken out in office when the drainage stops. Steri-Strips will fall off in 7 to 10 days or they will be removed during your first office visit. If you have dermabond glue covering over the incision, allow the glue to flake off on its own. Protect the new skin, especially from the sun. The sun can burn and cause darker scarring. Your scar will heal in about 4 to 6 weeks and will become softer and continue to fade over the next year.  The cosmetic appearance of the incisions will improve over the course of the first year after surgery. Sensation around your incision will return in a few weeks or months.  Bowel Movements After intestinal surgery, you may have loose watery stools for several days. If watery diarrhea lasts longer than 3 days, contact your surgeon. Pain medication (narcotics) can cause constipation. Increase the fiber in your diet with high-fiber foods if you are constipated. You can take an over the counter stool softener like Colace to avoid constipation.  Additional over the counter medications can also be used if Colace isn't sufficient (for example, Milk of Magnesia or Miralax).    Pain The amount of pain is different for each person. Some people need only 1 to 3 doses of pain control medication, while others need more. Take alternating doses of tylenol and ibuprofen around the clock for the first five days following surgery.  This will provide a baseline of pain control and help with inflammation.  Take the narcotic pain medication in addition if needed for severe pain.  Contact Your Surgeon at [phone], if you have: Pain that will not go away Pain that  gets worse A fever of more than 101F (38.3C) Repeated vomiting Swelling, redness, bleeding, or bad-smelling drainage from your wound site Strong abdominal pain No bowel movement or unable to pass gas for 3 days Watery diarrhea lasting longer than 3 days  Pain Control The goal of pain control is to minimize pain, keep you moving and help you heal. Your surgical team will work with you on your pain plan. Most often a combination of therapies and medications are used to control your pain. You may also be given medication (local anesthetic) at the surgical site. This may help control your pain for several days. Extreme pain puts extra stress on your body at a time when your body needs to focus on healing. Do not wait until your pain has reached a level "10" or is unbearable before telling your doctor or nurse. It is much easier to control pain before it becomes severe. Following a laparoscopic procedure, pain is sometimes felt in the shoulder. This is due to the gas inserted into your abdomen during the procedure. Moving and walking helps to decrease the gas and the right shoulder pain.  Use the guide below for ways to manage your post-operative pain. Learn more by going to facs.org/safepaincontrol.  How Intense Is My Pain Common Therapies to Feel Better       I hardly notice my pain, and it does not interfere with my activities.  I notice my pain and it distracts me, but I can still do activities (sitting up, walking, standing).  Non-Medication Therapies  Ice (in a bag, applied over clothing at the surgical site), elevation, rest, meditation, massage, distraction (music, TV, play) walking and mild exercise Splinting the abdomen with pillows +  Non-Opioid Medications Acetaminophen (Tylenol) Non-steroidal anti-inflammatory drugs (NSAIDS) Aspirin, Ibuprofen (Motrin, Advil) Naproxen (Aleve) Take these as needed, when you feel pain. Both acetaminophen and NSAIDs help to decrease pain  and swelling (inflammation).      My pain is hard to ignore and is more noticeable even when I rest.  My pain interferes with my usual activities.  Non-Medication Therapies  +  Non-Opioid medications  Take on a regular schedule (around-the-clock) instead of as needed. (For example, Tylenol every 6 hours at 9:00 am, 3:00 pm, 9:00 pm, 3:00 am and Motrin every 6 hours at 12:00 am, 6:00 am, 12:00 pm, 6:00 pm)         I am focused on my pain, and I am not doing my daily activities.  I am groaning in pain, and I cannot sleep. I am unable to do anything.  My pain is as bad as it could be, and nothing else matters.  Non-Medication Therapies  +  Around-the-Clock Non-Opioid Medications  +  Short-acting opioids  Opioids should be used with other medications to manage severe pain. Opioids block pain and give a feeling of euphoria (feel high). Addiction, a serious side effect of opioids, is rare with short-term (a few days) use.  Examples of short-acting opioids   include: Tramadol (Ultram), Hydrocodone (Norco, Vicodin), Hydromorphone (Dilaudid), Oxycodone (Oxycontin)     The above directions have been adapted from the [employer] Surgical Patient Education Program.  Please refer to the ACS website if needed: PreferredVet.ca.ashx   Louanna Raw, MD [facility], [address], [place], [place], [place]  [address] ?  [address], [place], [place]   [address] [phone] ? [phone] ? FAX [phone] Web site: [email or web address]  Post Anesthesia Home Care Instructions  Activity: Get plenty of rest for the remainder of the day. A responsible adult should stay with you for 24 hours following the procedure.  For the next 24 hours, DO NOT: -Drive a car -Paediatric nurse -Drink alcoholic beverages -Take any medication unless instructed by your  physician -Make any legal decisions or sign important papers.  Meals: Start with liquid foods such as gelatin or soup. Progress to regular foods as tolerated. Avoid greasy, spicy, heavy foods. If nausea and/or vomiting occur, drink only clear liquids until the nausea and/or vomiting subsides. Call your physician if vomiting continues.  Special Instructions/Symptoms: Your throat may feel dry or sore from the anesthesia or the breathing tube placed in your throat during surgery. If this causes discomfort, gargle with warm salt water. The discomfort should disappear within 24 hours.  If you had a scopolamine patch placed behind your ear for the management of post- operative nausea and/or vomiting:  1. The medication in the patch is effective for 72 hours, after which it should be removed.  Wrap patch in a tissue and discard in the trash. Wash hands thoroughly with soap and water. 2. You may remove the patch earlier than 72 hours if you experience unpleasant side effects which may include dry mouth, dizziness or visual disturbances. 3. Avoid touching the patch. Wash your hands with soap and water after contact with the patch.   Information for Discharge Teaching: EXPAREL (bupivacaine liposome injectable suspension)   Your surgeon gave you EXPAREL(bupivacaine) in your surgical incision to help control your pain after surgery.  EXPAREL is a local anesthetic that provides pain relief by numbing the tissue around the surgical site. EXPAREL is designed to release pain medication over time and can control pain for up to 72 hours. Depending on how you respond to EXPAREL, you may require less pain medication during your recovery.  Possible side effects: Temporary loss of sensation or ability to move in the area where bupivacaine was injected. Nausea, vomiting, constipation Rarely, numbness and tingling in your mouth or lips, lightheadedness, or anxiety may occur. Call your doctor right away if you  think you may be experiencing any of these sensations, or if you have other questions regarding possible side effects.  Follow all other discharge instructions given to you by your surgeon or nurse. Eat a healthy diet and drink plenty of water or other fluids.  If you return to the hospital for any reason within 96 hours following the administration of EXPAREL, please inform your health care providers.

## 2021-11-20 NOTE — H&P (Signed)
? ?Admitting Physician: Nickola Major Russie Gulledge ? ?Service: General surgery ? ?CC: Right inguinal hernia ? ?Subjective  ? ?HPI: ?Connor Adams is an 76 y.o. male who is here for elective right inguinal hernia repair, laparoscopic. ? ?Past Medical History:  ?Diagnosis Date  ? Arthritis   ? Chronic kidney disease (CKD), stage III (moderate) (HCC)   ? COVID-19   ? GERD (gastroesophageal reflux disease)   ? occ  ? Hemorrhoids   ? History of ETT 3/05  ? low risk  ? History of hiatal hernia   ? History of kidney stones   ? HLD (hyperlipidemia)   ? HTN (hypertension)   ? Left inguinal hernia   ? Melanoma ([place])   ? excied  ? Sciatica   ? from lumbar disc disease  ? Stroke [facility]) 2011  ? tia   ? TIA (transient ischemic attack) 4/11  ? associated w slurred speecha ndmild facial droop. lasted for about 10 minutes. Had MRI, etc with [facility] that per his report was ok (dr. Stephannie Li). Echo (4/11): EF 55-60%, no regional WMAs, normal diastolic function, mild LAE, no source of embolus  ? ? ?Past Surgical History:  ?Procedure Laterality Date  ? BMI 30.2 muscular  12/08/04  ? BRONCHIAL BRUSHINGS  04/15/2021  ? Procedure: BRONCHIAL BRUSHINGS;  Surgeon: Margaretha Seeds, MD;  Location: [place];  Service: Pulmonary;;  ? BRONCHIAL NEEDLE ASPIRATION BIOPSY  04/15/2021  ? Procedure: BRONCHIAL NEEDLE ASPIRATION BIOPSIES;  Surgeon: Margaretha Seeds, MD;  Location: [facility] ENDOSCOPY;  Service: Pulmonary;;  ? curretage actinic keratosis R ear  01/20/05  ? ENDOBRONCHIAL ULTRASOUND N/A 04/15/2021  ? Procedure: ENDOBRONCHIAL ULTRASOUND;  Surgeon: Margaretha Seeds, MD;  Location: [place];  Service: Pulmonary;  Laterality: N/A;  ? ETT low prob of CAD  11/21/03  ? excision melanoma in situ back  01/20/05  ? hemorrhoids sclerosed at colonoscopy  12/19/05  ? IR IMAGING GUIDED PORT INSERTION  05/19/2020  ? LAPAROSCOPIC INGUINAL HERNIA REPAIR Left 12/20/1995  ? LUMBAR DISC SURGERY    ? NERVE REPAIR    ? back of neck  ? retinal laser surgery Left  09/20/1986  ? SEPTOPLASTY  11/19/98  ? TOTAL KNEE ARTHROPLASTY Left 12/13/2016  ? Procedure: TOTAL KNEE ARTHROPLASTY WITH RIGHT KNEE CORTISONE INJECTION;  Surgeon: Frederik Pear, MD;  Location: [place];  Service: Orthopedics;  Laterality: Left;  ? uric acid 6.9  12/22/04  ? x-ray l great toe  07/21/00  ? ? ?Family History  ?Problem Relation Age of Onset  ? Liver cancer Sister   ? Heart failure Brother 30  ?     CABGx4  ? Heart attack Mother 24  ? Uterine cancer Mother   ? Heart attack Father 37  ? Asthma Son   ? Diabetes Brother   ? Prostate cancer Brother   ? ? ?Social:  reports that he has never smoked. He has never used smokeless tobacco. He reports that he does not drink alcohol and does not use drugs. ? ?Allergies: No Known Allergies ? ?Medications: ?Current Outpatient Medications  ?Medication Instructions  ? acetaminophen (TYLENOL) 650 mg, Oral, Every 6 hours PRN  ? albuterol (VENTOLIN HFA) 108 (90 Base) MCG/ACT inhaler 1-2 puffs, Inhalation, Every 6 hours PRN  ? amLODipine (NORVASC) 5 mg, Oral, Every morning  ? aspirin EC 81 mg, Oral, Every morning, Swallow whole.  ? Fish Oil 600 mg, Oral, Every morning  ? fluticasone (FLONASE) 50 MCG/ACT nasal spray 1-2 sprays, Each Nare, Daily  PRN  ? hydrocortisone (ANUSOL-HC) 2.5 % rectal cream 1 application, Rectal, Daily PRN  ? indomethacin (INDOCIN) 50 mg, Oral, 3 times daily PRN  ? loratadine (CLARITIN) 10 MG tablet Oral, Daily PRN  ? Multiple Vitamin (MULTIVITAMIN WITH MINERALS) TABS tablet 1 tablet, Oral, Every morning  ? polyvinyl alcohol (LIQUIFILM TEARS) 1.4 % ophthalmic solution 1 drop, Both Eyes, Every morning  ? rosuvastatin (CRESTOR) 5 mg, Oral, Every morning  ? ? ?ROS - all of the below systems have been reviewed with the patient and positives are indicated with bold text ?General: chills, fever or night sweats ?Eyes: blurry vision or double vision ?ENT: epistaxis or sore throat ?Allergy/Immunology: itchy/watery eyes or nasal congestion ?Hematologic/Lymphatic: bleeding  problems, blood clots or swollen lymph nodes ?Endocrine: temperature intolerance or unexpected weight changes ?Breast: new or changing breast lumps or nipple discharge ?Resp: cough, shortness of breath, or wheezing ?CV: chest pain or dyspnea on exertion ?GI: as per HPI ?GU: dysuria, trouble voiding, or hematuria ?MSK: joint pain or joint stiffness ?Neuro: TIA or stroke symptoms ?Derm: pruritus and skin lesion changes ?Psych: anxiety and depression ? ?Objective  ? ?PE ?Height 5\' 8"  (1.727 m), weight 83.9 kg. ?Constitutional: NAD; conversant; no deformities ?Eyes: Moist conjunctiva; no lid lag; anicteric; PERRL ?Neck: Trachea midline; no thyromegaly ?Lungs: Normal respiratory effort; no tactile fremitus ?CV: RRR; no palpable thrills; no pitting edema ?GI: Abd Right inguinal hernia; no palpable hepatosplenomegaly ?MSK: Normal range of motion of extremities; no clubbing/cyanosis ?Psychiatric: Appropriate affect; alert and oriented x3 ?Lymphatic: No palpable cervical or axillary lymphadenopathy ? ?No results found for this or any previous visit (from the past 24 hour(s)). ? ?Imaging Orders  ?No imaging studies ordered today  ? ? ? ?Assessment and Plan  ? ?Connor Adams is an 76 y.o. male with a right inguinal hernia. ? ?Right inguinal hernia ? ? ? ?I recommended laparoscopic right inguinal hernia repair with mesh. The procedure itself as well as its risk, benefits, and alternatives were discussed the patient in full who granted consent to proceed. We will send a note to his oncologist to ask for preoperative clearance as he has been recently treated and monitored for lymphoma. Our surgery scheduler will reach out to the patient to schedule surgery. ? ?Felicie Morn, MD ? ?[facility] ?Use [email or web address] to contact on call provider ? ? ? ?

## 2021-11-20 NOTE — Op Note (Signed)
? ?  Patient: Connor Adams (16-Mar-1946, [id number]) ? ?Date of Surgery: 11/20/2021  ? ?Preoperative Diagnosis: RIGHT INGUINAL HERNIA  ? ?Postoperative Diagnosis: RIGHT INGUINAL HERNIA  ? ?Surgical Procedure: LAPAROSCOPIC RIGHT  INGUINAL HERNIA REPAIR WITH MESH: [id number]  ? ?Operative Team Members:  ?Surgeon(s) and Role: ?   * Ajai Harville, Nickola Major, MD - Primary  ? ?Anesthesiologist: Barnet Glasgow, MD ?CRNA: Bonney Aid, CRNA  ? ?Anesthesia: General  ? ?Fluids:  ?Total I/O ?In: 100 [IV Piggyback:100] ?Out: 10 [Blood:10] ? ?Complications: None ? ?Drains:  None ? ?Specimen: None ? ?Disposition:  PACU - hemodynamically stable. ? ?Plan of Care: Discharge to home after PACU ? ?Indications for Procedure: Connor Adams is a 76 y.o. male who presented with a right inguinal hernia.  I recommended laparoscopic repair.  The procedure itself as well as its risks, benefits and alternatives were discussed and the patient granted consent to proceed.  We will proceed as scheduled. ? ?Findings:  ?Technique: Transabdominal preperitoneal (TAPP) ?Hernia Location: Right indirect inguinal hernia ?Mesh Size &Type:  Bard 3D Max extra-large right sided mesh ?Mesh Fixation: Endo-Universal hernia stapler ? ?Infection status: ?Patient: Connor Adams Emergency General Surgery Service Patient ?Case: Elective ?Infection Present At Time Of Surgery (PATOS): None ? ? ?Description of Procedure: ? ?The patient was positioned supine, padded and secured to the bed, with both arms tucked.  The abdomen was widely prepped and draped.  A time out procedure was performed.  A 1 cm infraumbilical incision was made.  The abdomen was entered without trauma to the underlying viscera.  The abdomen was insufflated to 15 mm of Hg.  A 12 mm trocar was inserted at the periumbilical incision.  Additional 5 mm trocars were placed in the left and right abdomen.  There was no trauma to the underlying viscera. ? ?There was an indirect hernia on the RIGHT.   Utilizing a transabdominal pre peritoneal technique (TAPP), a horizontal incision was made in the peritoneum, immediately below the umbilicus.  Dissection was carried out in the pre peritoneal space down to the level of the hernia sac which was reduced into the peritoneal cavity completely.  The cord contents were parietalized and preserved.  A large pre peritoneal dissection was performed to uncover the direct, indirect, femoral and obturator spaces.  Cooper?s ligament was uncovered medially and the psoas muscle uncovered laterally. ? ?The mesh, as documented above, was opened and advanced into the pre peritoneal position so that it more than adequately covered the indirect, direct, femoral and obturator spaces.  The mesh laid flat, with no inferior folds and covered the entire myopectineal orifice.  The mesh was fixated with the endo-universal hernia stapler to Cooper?s ligament and the posterior aspect of the rectus muscle.  The peritoneal flap was closed with the same device.  There were no peritoneal defects or exposed mesh at the conclusion. ? ?The umbilical trocar was removed and the fascial defect was closed with a 0 Vicryl suture.  The peritoneal cavity was completely desufflated, the trocars removed and the skin closed with 4-0 Monocryl subcuticular suture and skin glue.  All sponge and needle counts were correct at the end of the case. ? ?Connor Raw, MD ?General, Bariatric, & Minimally Invasive Surgery ?[facility], [place] ? ?

## 2021-11-20 NOTE — Transfer of Care (Signed)
Immediate Anesthesia Transfer of Care Note ? ?Patient: Connor Adams ? ?Procedure(s) Performed: LAPAROSCOPIC RIGHT  INGUINAL HERNIA REPAIR WITH MESH (Right) ? ?Patient Location: PACU ? ?Anesthesia Type:General ? ?Level of Consciousness: awake, alert  and oriented ? ?Airway & Oxygen Therapy: Patient Spontanous Breathing and Patient connected to nasal cannula oxygen ? ?Post-op Assessment: Report given to RN ? ?Post vital signs: Reviewed and stable ? ?Last Vitals:  ?Vitals Value Taken Time  ?BP 160/63 11/20/21 1215  ?Temp    ?Pulse 56 11/20/21 1216  ?Resp 18 11/20/21 1216  ?SpO2 98 % 11/20/21 1216  ?Vitals shown include unvalidated device data. ? ?Last Pain:  ?Vitals:  ? 11/20/21 0926  ?TempSrc: Oral  ?PainSc: 0-No pain  ?   ? ?Patients Stated Pain Goal: 7 (11/20/21 1103) ? ?Complications: No notable events documented. ?

## 2021-11-20 NOTE — Anesthesia Postprocedure Evaluation (Signed)
Anesthesia Post Note ? ?Patient: Connor Adams ? ?Procedure(s) Performed: LAPAROSCOPIC RIGHT  INGUINAL HERNIA REPAIR WITH MESH (Right) ? ?  ? ?Patient location during evaluation: PACU ?Anesthesia Type: General ?Level of consciousness: awake and alert ?Pain management: pain level controlled ?Vital Signs Assessment: post-procedure vital signs reviewed and stable ?Respiratory status: spontaneous breathing, nonlabored ventilation, respiratory function stable and patient connected to nasal cannula oxygen ?Cardiovascular status: blood pressure returned to baseline and stable ?Postop Assessment: no apparent nausea or vomiting ?Anesthetic complications: no ? ? ?No notable events documented. ? ?Last Vitals:  ?Vitals:  ? 11/20/21 1245 11/20/21 1347  ?BP: 139/65 (!) 154/44  ?Pulse: (!) 50 (!) 50  ?Resp: 13 14  ?Temp:  (!) 36.3 ?C  ?SpO2: 97% 99%  ?  ?Last Pain:  ?Vitals:  ? 11/20/21 1347  ?TempSrc:   ?PainSc: 0-No pain  ? ? ?  ?  ?  ?  ?  ?  ? ?Barnet Glasgow ? ? ? ? ?

## 2021-11-20 NOTE — Anesthesia Procedure Notes (Signed)
Procedure Name: Intubation ?Date/Time: 11/20/2021 10:50 AM ?Performed by: Bonney Aid, CRNA ?Pre-anesthesia Checklist: Patient identified, Emergency Drugs available, Suction available and Patient being monitored ?Patient Re-evaluated:Patient Re-evaluated prior to induction ?Oxygen Delivery Method: Circle system utilized ?Preoxygenation: Pre-oxygenation with 100% oxygen ?Induction Type: IV induction ?Ventilation: Mask ventilation without difficulty ?Laryngoscope Size: Mac ?Grade View: Grade I ?Tube type: Oral ?Tube size: 7.5 mm ?Number of attempts: 1 ?Airway Equipment and Method: Stylet ?Placement Confirmation: ETT inserted through vocal cords under direct vision, positive ETCO2 and breath sounds checked- equal and bilateral ?Secured at: 21 cm ?Tube secured with: Tape ?Dental Injury: Teeth and Oropharynx as per pre-operative assessment  ? ? ? ? ?

## 2021-11-23 ENCOUNTER — Encounter (HOSPITAL_BASED_OUTPATIENT_CLINIC_OR_DEPARTMENT_OTHER): Payer: Self-pay | Admitting: Surgery

## 2021-11-30 ENCOUNTER — Telehealth: Payer: Self-pay | Admitting: *Deleted

## 2021-11-30 NOTE — Telephone Encounter (Signed)
Received call from pt's wife stating that [facility]  needs a port flush appt. ? ?Scheduling message sent to have this done in the next 1-2 weeks. Last flushed on 10/23/21 ?

## 2021-12-01 ENCOUNTER — Telehealth: Payer: Self-pay | Admitting: Hematology and Oncology

## 2021-12-01 NOTE — Telephone Encounter (Signed)
Scheduled per 03/14 scheduled message, patient is notified. ?

## 2021-12-07 DIAGNOSIS — C44722 Squamous cell carcinoma of skin of right lower limb, including hip: Secondary | ICD-10-CM | POA: Diagnosis not present

## 2021-12-07 DIAGNOSIS — C44329 Squamous cell carcinoma of skin of other parts of face: Secondary | ICD-10-CM | POA: Diagnosis not present

## 2021-12-07 DIAGNOSIS — C44319 Basal cell carcinoma of skin of other parts of face: Secondary | ICD-10-CM | POA: Diagnosis not present

## 2021-12-08 DIAGNOSIS — K1329 Other disturbances of oral epithelium, including tongue: Secondary | ICD-10-CM | POA: Diagnosis not present

## 2021-12-08 DIAGNOSIS — D3709 Neoplasm of uncertain behavior of other specified sites of the oral cavity: Secondary | ICD-10-CM | POA: Diagnosis not present

## 2021-12-09 ENCOUNTER — Other Ambulatory Visit: Payer: Self-pay

## 2021-12-09 ENCOUNTER — Inpatient Hospital Stay: Payer: Medicare PPO | Attending: Hematology and Oncology

## 2021-12-09 DIAGNOSIS — C8338 Diffuse large B-cell lymphoma, lymph nodes of multiple sites: Secondary | ICD-10-CM | POA: Insufficient documentation

## 2021-12-09 DIAGNOSIS — Z452 Encounter for adjustment and management of vascular access device: Secondary | ICD-10-CM | POA: Insufficient documentation

## 2021-12-09 DIAGNOSIS — Z95828 Presence of other vascular implants and grafts: Secondary | ICD-10-CM

## 2021-12-09 MED ORDER — HEPARIN SOD (PORK) LOCK FLUSH 100 UNIT/ML IV SOLN
500.0000 [IU] | Freq: Once | INTRAVENOUS | Status: AC
  Administered 2021-12-09: 500 [IU]

## 2021-12-09 MED ORDER — SODIUM CHLORIDE 0.9% FLUSH
10.0000 mL | Freq: Once | INTRAVENOUS | Status: AC
  Administered 2021-12-09: 10 mL

## 2021-12-31 DIAGNOSIS — Z08 Encounter for follow-up examination after completed treatment for malignant neoplasm: Secondary | ICD-10-CM | POA: Diagnosis not present

## 2021-12-31 DIAGNOSIS — Z85828 Personal history of other malignant neoplasm of skin: Secondary | ICD-10-CM | POA: Diagnosis not present

## 2021-12-31 DIAGNOSIS — D044 Carcinoma in situ of skin of scalp and neck: Secondary | ICD-10-CM | POA: Diagnosis not present

## 2021-12-31 DIAGNOSIS — X32XXXD Exposure to sunlight, subsequent encounter: Secondary | ICD-10-CM | POA: Diagnosis not present

## 2021-12-31 DIAGNOSIS — C44219 Basal cell carcinoma of skin of left ear and external auricular canal: Secondary | ICD-10-CM | POA: Diagnosis not present

## 2021-12-31 DIAGNOSIS — L570 Actinic keratosis: Secondary | ICD-10-CM | POA: Diagnosis not present

## 2022-01-04 ENCOUNTER — Telehealth: Payer: Self-pay | Admitting: Hematology and Oncology

## 2022-01-04 NOTE — Telephone Encounter (Signed)
R/s per provider pal, pt has been called and confirmed new appt ?

## 2022-01-15 ENCOUNTER — Ambulatory Visit (HOSPITAL_COMMUNITY)
Admission: RE | Admit: 2022-01-15 | Discharge: 2022-01-15 | Disposition: A | Discharge status: Home / Self Care | Payer: Medicare PPO | Source: Ambulatory Visit | Attending: Hematology and Oncology | Admitting: Hematology and Oncology

## 2022-01-15 ENCOUNTER — Encounter (HOSPITAL_COMMUNITY): Payer: Self-pay

## 2022-01-15 DIAGNOSIS — C8330 Diffuse large B-cell lymphoma, unspecified site: Secondary | ICD-10-CM | POA: Diagnosis not present

## 2022-01-15 DIAGNOSIS — C8333 Diffuse large B-cell lymphoma, intra-abdominal lymph nodes: Secondary | ICD-10-CM | POA: Insufficient documentation

## 2022-01-15 DIAGNOSIS — J219 Acute bronchiolitis, unspecified: Secondary | ICD-10-CM | POA: Diagnosis not present

## 2022-01-15 DIAGNOSIS — M4699 Unspecified inflammatory spondylopathy, multiple sites in spine: Secondary | ICD-10-CM | POA: Diagnosis not present

## 2022-01-15 DIAGNOSIS — I2510 Atherosclerotic heart disease of native coronary artery without angina pectoris: Secondary | ICD-10-CM | POA: Diagnosis not present

## 2022-01-15 DIAGNOSIS — D7389 Other diseases of spleen: Secondary | ICD-10-CM | POA: Diagnosis not present

## 2022-01-15 DIAGNOSIS — N132 Hydronephrosis with renal and ureteral calculous obstruction: Secondary | ICD-10-CM | POA: Diagnosis not present

## 2022-01-15 LAB — POCT I-STAT CREATININE: Creatinine, Ser: 1.4 mg/dL — ABNORMAL HIGH (ref 0.61–1.24)

## 2022-01-15 MED ORDER — IOHEXOL 9 MG/ML PO SOLN
500.0000 mL | ORAL | Status: AC
  Administered 2022-01-15 (×2): 500 mL via ORAL

## 2022-01-15 MED ORDER — SODIUM CHLORIDE (PF) 0.9 % IJ SOLN
INTRAMUSCULAR | Status: AC
  Filled 2022-01-15: qty 50

## 2022-01-15 MED ORDER — IOHEXOL 300 MG/ML  SOLN
100.0000 mL | Freq: Once | INTRAMUSCULAR | Status: AC | PRN
Start: 2022-01-15 — End: 2022-01-15
  Administered 2022-01-15: 100 mL via INTRAVENOUS

## 2022-01-18 ENCOUNTER — Telehealth: Payer: Self-pay | Admitting: *Deleted

## 2022-01-18 NOTE — Telephone Encounter (Signed)
TCT patient regarding recent scan results. Spoke with his wife and advised that his scan di not show any recurrent lymphoma. He does have a kidney stone in his right kidney. She states he had a kidney stone years ago. Advised that if he should develop right flank pain, that it would likely be that kidney stone. She voiced understanding and Grace is aware of his appt on 01/26/22 ?

## 2022-01-18 NOTE — Telephone Encounter (Signed)
-----   Message from Orson Slick, MD sent at 01/15/2022  2:43 PM EDT ----- ?Please let Mr. Humphres know that his CT scan did not show any evidence of recurrent lymphoma, however there was evidence of a kidney stone with mild enlargement of his kidney. Please assure he is not having any pain in his right flank. We will plan to see him back on 01/26/2022.  ?----- Message ----- ?From: Interface, Rad Results In ?Sent: 01/15/2022   2:38 PM EDT ?To: Orson Slick, MD ? ? ?

## 2022-01-25 ENCOUNTER — Ambulatory Visit: Payer: Medicare PPO | Admitting: Hematology and Oncology

## 2022-01-25 ENCOUNTER — Other Ambulatory Visit: Payer: Medicare PPO

## 2022-01-26 ENCOUNTER — Other Ambulatory Visit: Payer: Self-pay

## 2022-01-26 ENCOUNTER — Inpatient Hospital Stay: Payer: Medicare PPO | Admitting: Hematology and Oncology

## 2022-01-26 ENCOUNTER — Other Ambulatory Visit: Payer: Self-pay | Admitting: Hematology and Oncology

## 2022-01-26 ENCOUNTER — Inpatient Hospital Stay: Payer: Medicare PPO | Attending: Hematology and Oncology

## 2022-01-26 VITALS — BP 165/78 | HR 61 | Temp 97.7°F | Resp 17 | Ht 67.0 in | Wt 189.8 lb

## 2022-01-26 DIAGNOSIS — C8333 Diffuse large B-cell lymphoma, intra-abdominal lymph nodes: Secondary | ICD-10-CM

## 2022-01-26 DIAGNOSIS — Z452 Encounter for adjustment and management of vascular access device: Secondary | ICD-10-CM | POA: Diagnosis not present

## 2022-01-26 DIAGNOSIS — R918 Other nonspecific abnormal finding of lung field: Secondary | ICD-10-CM | POA: Diagnosis not present

## 2022-01-26 DIAGNOSIS — N200 Calculus of kidney: Secondary | ICD-10-CM

## 2022-01-26 DIAGNOSIS — C8338 Diffuse large B-cell lymphoma, lymph nodes of multiple sites: Secondary | ICD-10-CM | POA: Diagnosis not present

## 2022-01-26 DIAGNOSIS — Z95828 Presence of other vascular implants and grafts: Secondary | ICD-10-CM | POA: Diagnosis not present

## 2022-01-26 LAB — CMP (CANCER CENTER ONLY)
ALT: 20 U/L (ref 0–44)
AST: 28 U/L (ref 15–41)
Albumin: 4.2 g/dL (ref 3.5–5.0)
Alkaline Phosphatase: 62 U/L (ref 38–126)
Anion gap: 10 (ref 5–15)
BUN: <5 mg/dL — ABNORMAL LOW (ref 8–23)
CO2: 27 mmol/L (ref 22–32)
Calcium: 9.5 mg/dL (ref 8.9–10.3)
Chloride: 106 mmol/L (ref 98–111)
Creatinine: <0.3 mg/dL — ABNORMAL LOW (ref 0.61–1.24)
Glucose, Bld: 97 mg/dL (ref 70–99)
Potassium: 4.3 mmol/L (ref 3.5–5.1)
Sodium: 143 mmol/L (ref 135–145)
Total Bilirubin: 0.6 mg/dL (ref 0.3–1.2)
Total Protein: 7.4 g/dL (ref 6.5–8.1)

## 2022-01-26 LAB — CBC WITH DIFFERENTIAL (CANCER CENTER ONLY)
Abs Immature Granulocytes: 0.01 10*3/uL (ref 0.00–0.07)
Basophils Absolute: 0.1 10*3/uL (ref 0.0–0.1)
Basophils Relative: 1 %
Eosinophils Absolute: 0.2 10*3/uL (ref 0.0–0.5)
Eosinophils Relative: 3 %
HCT: 42.2 % (ref 39.0–52.0)
Hemoglobin: 14 g/dL (ref 13.0–17.0)
Immature Granulocytes: 0 %
Lymphocytes Relative: 41 %
Lymphs Abs: 2.7 10*3/uL (ref 0.7–4.0)
MCH: 32.1 pg (ref 26.0–34.0)
MCHC: 33.2 g/dL (ref 30.0–36.0)
MCV: 96.8 fL (ref 80.0–100.0)
Monocytes Absolute: 0.4 10*3/uL (ref 0.1–1.0)
Monocytes Relative: 7 %
Neutro Abs: 3.2 10*3/uL (ref 1.7–7.7)
Neutrophils Relative %: 48 %
Platelet Count: 172 10*3/uL (ref 150–400)
RBC: 4.36 MIL/uL (ref 4.22–5.81)
RDW: 13.3 % (ref 11.5–15.5)
WBC Count: 6.7 10*3/uL (ref 4.0–10.5)
nRBC: 0 % (ref 0.0–0.2)

## 2022-01-26 LAB — LACTATE DEHYDROGENASE: LDH: 138 U/L (ref 98–192)

## 2022-01-26 NOTE — Progress Notes (Signed)
?[place] ?[phone]   [phone] ? ?PROGRESS NOTE ? ?Patient Care Team: ?Connor Frees, MD as PCP - General (Family Medicine) ? 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   ?1) 04/17/2020:  CT A/P showed dominant nodal mass (11.5 x 10.9 cm) within the jejunal mesentery, highly suspicious for lymphoma. Additionally there are left lower lobe pleural-based pulmonary nodules ?2) 04/24/2020: establish care with Dr. Lorenso Adams ?3) 04/28/2020:  PET CT scan shows Large solid left mesenteric mass 13.0 cm with maximum SUV of 21.2, Deauville 5 with local hypermetabolic porta hepatis and retroperitoneal lymph nodes. Constitutes bulky Stage I disease.  ?4) 05/27/2020-05/31/2020: Cycle 1 of R-EPOCH ?5) 06/16/2020-06/23/2020: Cycle 2 of R-EPOCH ?6) 07/07/2020-07/11/2020: Cycle 3 of R-EPOCH  ?7) 07/28/2020-08/01/2020: Cycle 4 of R-EPOCH ?8) 08/18/2020-08/22/2020: Cycle 5 of R-EPOCH ?9) 09/08/2020-09/12/2020: Cycle 6 of R-EPOCH ?10) 10/06/2020: PET CT scan shows decreased size of dominant mesenteric mass, however there was hypermetabolic activity within a single left anterior mesenteric lymph node  ?11) 12/31/2020: PET CT scan showed hypermetabolic nodular thickening the medial aspect of the RIGHT lower lobe is concerning for neoplasm versus atypical inflammatory response ?12) 03/27/2021: PET CT Scan showed progression of disease compared with 12/30/2020. Multiple mediastinal and hilar lymph nodes exhibit new FDG uptake compatible with Deauville criteria 5. ?13) 04/15/2021: bronchoscopy performed with biopsy of mediastinal lymph nodes.  Findings consistent with granulomatous disease.  No evidence of recurrence. ?14) 07/22/2021: PET CT scan showed mediastinal, hilar, abdominal and inguinal lymph nodes, compatible with the provided history of lymphoma (overall Deauville 4). ?15) 10/21/2021: CT scan shows no clear evidence of recurrence of lymphoma. Stable  previously note lymph nodes and lung nodules.  ? ?Interval History:  ?Connor Adams 76 y.o. male with medical history significant for Stage I DLBCL who presents for a follow up visit. The patient was last seen on 10/23/2021 .  In the interim since he underwent a CT scan which showed no evidence of recurrent disease.  ? ?On exam today Connor Adams is unaccompanied.  He reports that he underwent his hernia surgery in early March 2023.  He notes that he is recovered well from the procedure.  He does still have some occasional issues with swelling in his lower extremities and some occasional bouts of numbness.  He reports that he is also scheduled for a Mohs procedure on the skin above his left eye.  He notes he is not having any bone pain, back pain, or flank pain at this time.  He worries that he does not keep up with hydration as he should.  He is interested in having his port removed.  Otherwise he has no questions, concerns or complaints.  He currently denies having issues with fevers, chills, sweats, nausea, vomiting or diarrhea.  A full 10 point ROS is listed below. ? ?MEDICAL HISTORY:  ?Past Medical History:  ?Diagnosis Date  ? Arthritis   ? Chronic kidney disease (CKD), stage III (moderate) (HCC)   ? COVID-19   ? GERD (gastroesophageal reflux disease)   ? occ  ? Hemorrhoids   ? History of ETT 3/05  ? low risk  ? History of hiatal hernia   ? History of kidney stones   ? HLD (hyperlipidemia)   ? HTN (hypertension)   ? Left inguinal hernia   ? Melanoma ([place])   ? excied  ? Sciatica   ? from lumbar disc disease  ? Stroke [facility]) 2011  ?  tia   ? TIA (transient ischemic attack) 4/11  ? associated w slurred speecha ndmild facial droop. lasted for about 10 minutes. Had MRI, etc with [facility] that per his report was ok (dr. Stephannie Adams). Echo (4/11): EF 55-60%, no regional WMAs, normal diastolic function, mild LAE, no source of embolus  ? ? ?SURGICAL HISTORY: ?Past Surgical History:  ?Procedure Laterality Date  ?  BMI 30.2 muscular  12/08/04  ? BRONCHIAL BRUSHINGS  04/15/2021  ? Procedure: BRONCHIAL BRUSHINGS;  Surgeon: Connor Seeds, MD;  Location: [place];  Service: Pulmonary;;  ? BRONCHIAL NEEDLE ASPIRATION BIOPSY  04/15/2021  ? Procedure: BRONCHIAL NEEDLE ASPIRATION BIOPSIES;  Surgeon: Connor Seeds, MD;  Location: [facility] ENDOSCOPY;  Service: Pulmonary;;  ? curretage actinic keratosis R ear  01/20/05  ? ENDOBRONCHIAL ULTRASOUND N/A 04/15/2021  ? Procedure: ENDOBRONCHIAL ULTRASOUND;  Surgeon: Connor Seeds, MD;  Location: [place];  Service: Pulmonary;  Laterality: N/A;  ? ETT low prob of CAD  11/21/03  ? excision melanoma in situ back  01/20/05  ? hemorrhoids sclerosed at colonoscopy  12/19/05  ? INGUINAL HERNIA REPAIR Right 11/20/2021  ? Procedure: LAPAROSCOPIC RIGHT  INGUINAL HERNIA REPAIR WITH MESH;  Surgeon: Adams, Connor Major, MD;  Location: [place];  Service: General;  Laterality: Right;  ? IR IMAGING GUIDED PORT INSERTION  05/19/2020  ? LAPAROSCOPIC INGUINAL HERNIA REPAIR Left 12/20/1995  ? LUMBAR DISC SURGERY    ? NERVE REPAIR    ? back of neck  ? retinal laser surgery Left 09/20/1986  ? SEPTOPLASTY  11/19/98  ? TOTAL KNEE ARTHROPLASTY Left 12/13/2016  ? Procedure: TOTAL KNEE ARTHROPLASTY WITH RIGHT KNEE CORTISONE INJECTION;  Surgeon: Frederik Pear, MD;  Location: [place];  Service: Orthopedics;  Laterality: Left;  ? uric acid 6.9  12/22/04  ? x-ray l great toe  07/21/00  ? ? ?SOCIAL HISTORY: ?Social History  ? ?Socioeconomic History  ? Marital status: Married  ?  Spouse name: Connor Adams  ? Number of children: 2  ? Years of education: 29  ? Highest education level: Not on file  ?Occupational History  ? Occupation: Art therapist   ?  Employer: [employer]  ?Tobacco Use  ? Smoking status: Never  ? Smokeless tobacco: Never  ?Vaping Use  ? Vaping Use: Never used  ?Substance and Sexual Activity  ? Alcohol use: No  ? Drug use: No  ? Sexual activity: Yes  ?Other Topics Concern  ? Not on file  ?Social  History Narrative  ? Married to [place]  ? 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  ? Son, Camila Adams married  ? Son, [name], Married.   ? Caffeine use: 1 cup coffee per day  ? ?Social Determinants of Health  ? ?Financial Resource Strain: Not on file  ?Food Insecurity: Not on file  ?Transportation Needs: Not on file  ?Physical Activity: Not on file  ?Stress: Not on file  ?Social Connections: Not on file  ?Intimate Partner Violence: Not on file  ? ? ?FAMILY HISTORY: ?Family History  ?Problem Relation Age of Onset  ? Liver cancer Sister   ? Heart failure Brother 7  ?     CABGx4  ? Heart attack Mother 44  ? Uterine cancer Mother   ? Heart attack Father 75  ? Asthma Son   ? Diabetes Brother   ? Prostate cancer Brother   ? ? ?ALLERGIES:  has No Known Allergies. ? ?MEDICATIONS:  ?Current Outpatient Medications  ?Medication Sig Dispense Refill  ?  acetaminophen (TYLENOL) 325 MG tablet Take 2 tablets (650 mg total) by mouth every 6 (six) hours as needed for mild pain, moderate pain or fever.    ? albuterol (VENTOLIN HFA) 108 (90 Base) MCG/ACT inhaler Inhale 1-2 puffs into the lungs every 6 (six) hours as needed for wheezing or shortness of breath.    ? amLODipine (NORVASC) 5 MG tablet Take 5 mg by mouth in the morning.    ? aspirin EC 81 MG tablet Take 81 mg by mouth in the morning. Swallow whole.    ? fluticasone (FLONASE) 50 MCG/ACT nasal spray Place 1-2 sprays into both nostrils daily as needed for allergies or rhinitis.  2  ? hydrocortisone (ANUSOL-HC) 2.5 % rectal cream Place 1 application rectally daily as needed. (Patient taking differently: Place 1 application rectally daily as needed for hemorrhoids or anal itching.) 30 g 0  ? indomethacin (INDOCIN) 50 MG capsule Take 50 mg by mouth 3 (three) times daily as needed (GOUT).    ? loratadine (CLARITIN) 10 MG tablet Take by mouth daily as needed for allergies.    ? Multiple Vitamin (MULTIVITAMIN WITH MINERALS) TABS tablet Take 1 tablet by mouth in the morning.    ? Omega-3 Fatty  Acids (FISH OIL) 600 MG CAPS Take 600 mg by mouth in the morning.    ? oxyCODONE-acetaminophen (PERCOCET) 5-325 MG tablet Take 1 tablet by mouth every 4 (four) hours as needed for severe pain. 10 tablet 0

## 2022-01-27 ENCOUNTER — Telehealth: Payer: Self-pay | Admitting: Hematology and Oncology

## 2022-01-27 NOTE — Telephone Encounter (Signed)
Scheduled per 5/9 los, pt has been called and confirmed  ?

## 2022-02-02 ENCOUNTER — Ambulatory Visit (HOSPITAL_COMMUNITY)
Admission: RE | Admit: 2022-02-02 | Discharge: 2022-02-02 | Disposition: A | Discharge status: Home / Self Care | Payer: Medicare PPO | Source: Ambulatory Visit | Attending: Hematology and Oncology | Admitting: Hematology and Oncology

## 2022-02-02 DIAGNOSIS — Z452 Encounter for adjustment and management of vascular access device: Secondary | ICD-10-CM | POA: Diagnosis not present

## 2022-02-02 DIAGNOSIS — Z95828 Presence of other vascular implants and grafts: Secondary | ICD-10-CM

## 2022-02-02 HISTORY — PX: IR REMOVAL TUN ACCESS W/ PORT W/O FL MOD SED: IMG2290

## 2022-02-02 MED ORDER — LIDOCAINE-EPINEPHRINE 1 %-1:100000 IJ SOLN
INTRAMUSCULAR | Status: DC | PRN
  Administered 2022-02-02: 8 mL via INTRADERMAL

## 2022-02-02 MED ORDER — LIDOCAINE-EPINEPHRINE 1 %-1:100000 IJ SOLN
INTRAMUSCULAR | Status: AC
  Filled 2022-02-02: qty 1

## 2022-02-03 DIAGNOSIS — C44319 Basal cell carcinoma of skin of other parts of face: Secondary | ICD-10-CM | POA: Diagnosis not present

## 2022-02-03 DIAGNOSIS — C44219 Basal cell carcinoma of skin of left ear and external auricular canal: Secondary | ICD-10-CM | POA: Diagnosis not present

## 2022-02-26 DIAGNOSIS — C44219 Basal cell carcinoma of skin of left ear and external auricular canal: Secondary | ICD-10-CM | POA: Diagnosis not present

## 2022-03-05 DIAGNOSIS — L089 Local infection of the skin and subcutaneous tissue, unspecified: Secondary | ICD-10-CM | POA: Diagnosis not present

## 2022-03-21 IMAGING — CT CT ABD-PELV W/ CM
2 of 5 series · 15 of 46 positions shown, 17 images · IV contrast (iopamidol)
Comparison: 12/19/2009 limited abdominal ultrasound. No comparison
CT.

CLINICAL DATA: Left upper abdominal discomfort. Possible hernia.
Heartburn. History of melanoma.

EXAM:
CT ABDOMEN AND PELVIS WITH CONTRAST
TECHNIQUE: Multidetector CT imaging of the abdomen and pelvis was performed
using the standard protocol following bolus administration of
intravenous contrast.
CONTRAST:  100mL [id number] IOPAMIDOL (M83HG5-JZZ) INJECTION 61%

[Series 2: abd pelvis 5.00 br40 s3 axial · axial · 0.73mm/px · z∈[+1103,+1458]mm · 12 of 83 slices shown, 14 images]
[im 6/83  soft-tissue]
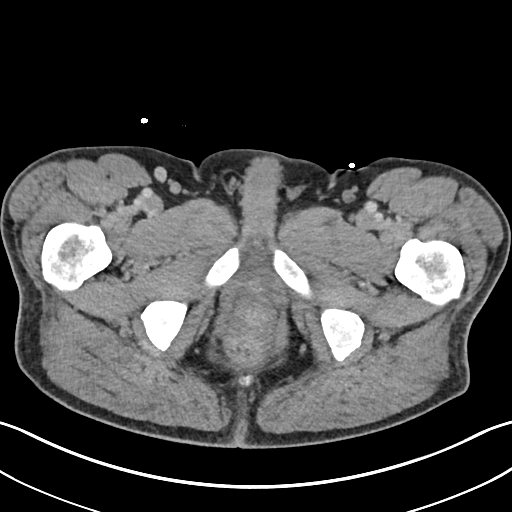
[im 6/83  bone]
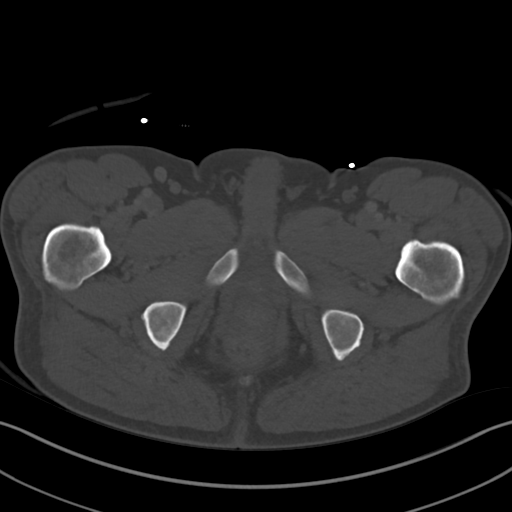
[im 11/83  soft-tissue]
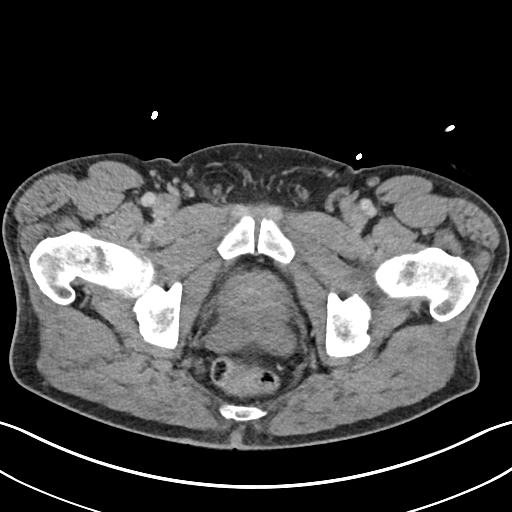
[im 21/83  soft-tissue]
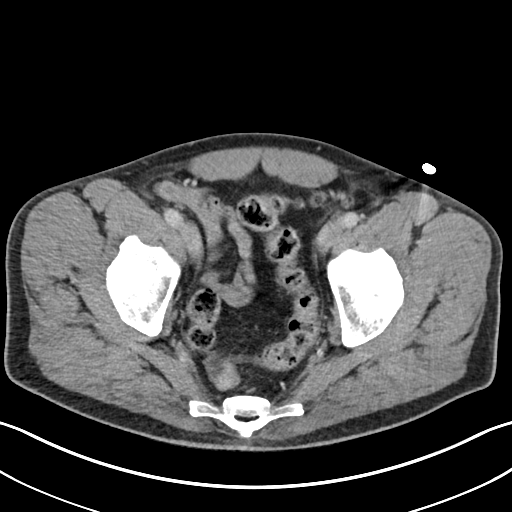
[im 26/83  soft-tissue]
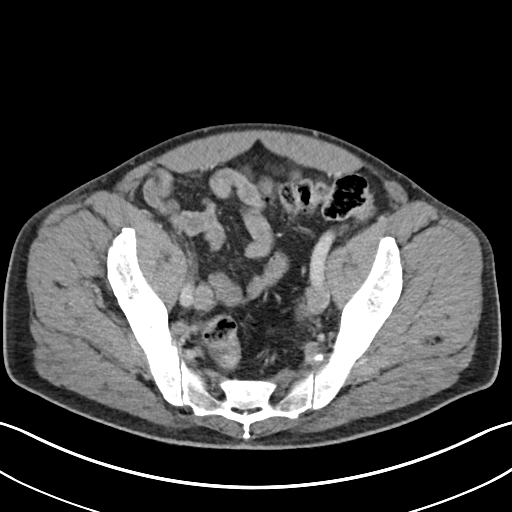
[im 31/83  soft-tissue]
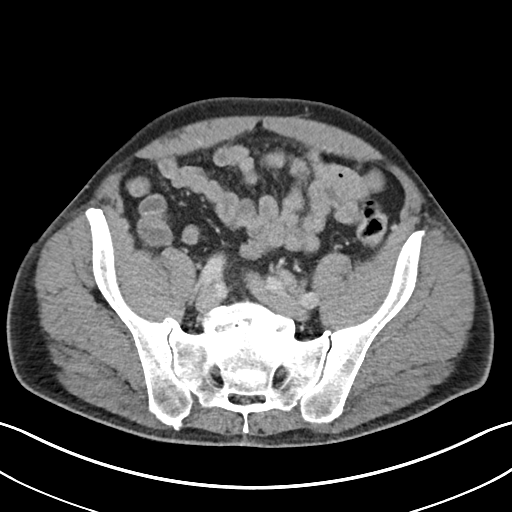
[im 36/83  soft-tissue]
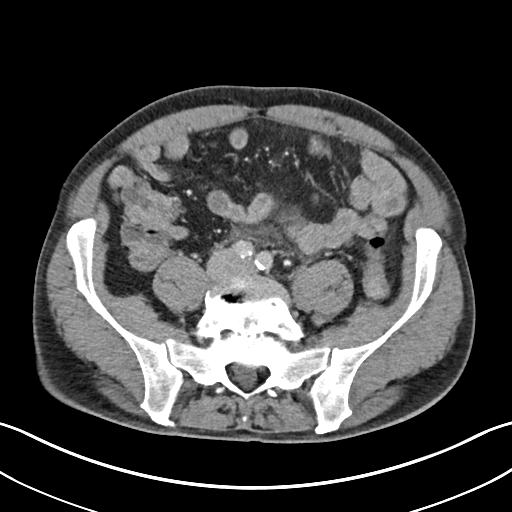
[im 47/83  soft-tissue]
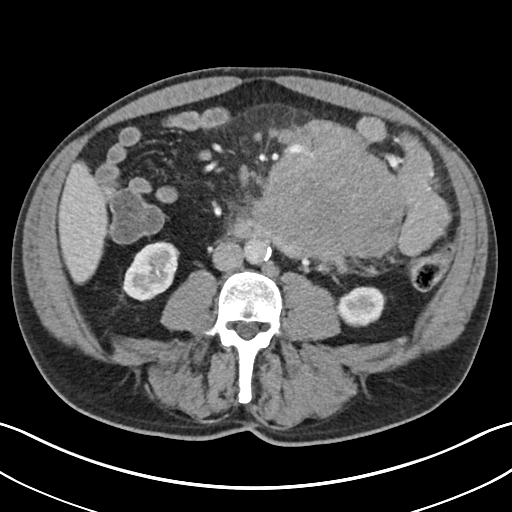
[im 52/83  soft-tissue]
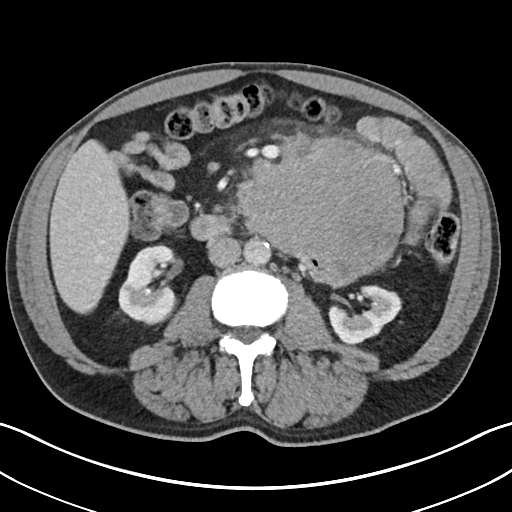
[im 57/83  soft-tissue]
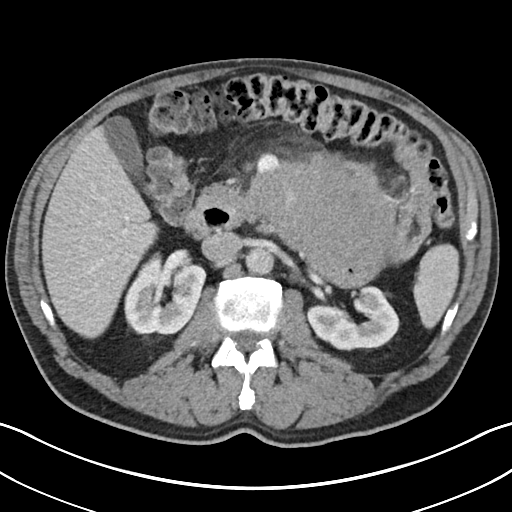
[im 57/83  bone]
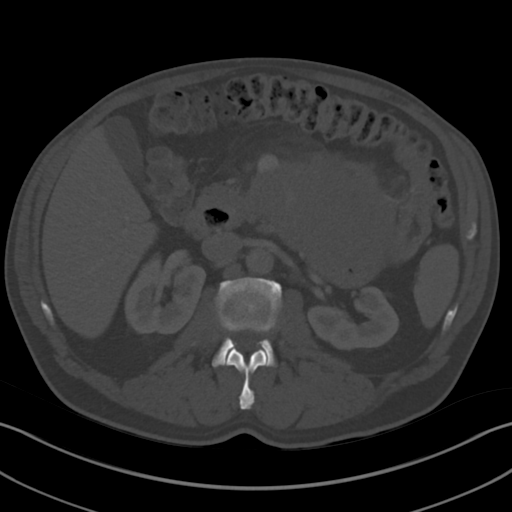
[im 62/83  soft-tissue]
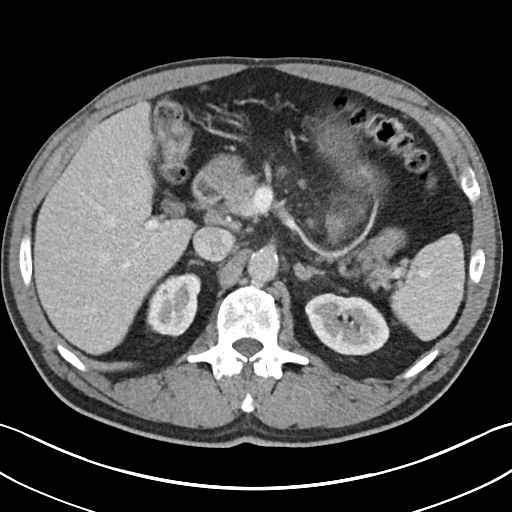
[im 72/83  soft-tissue]
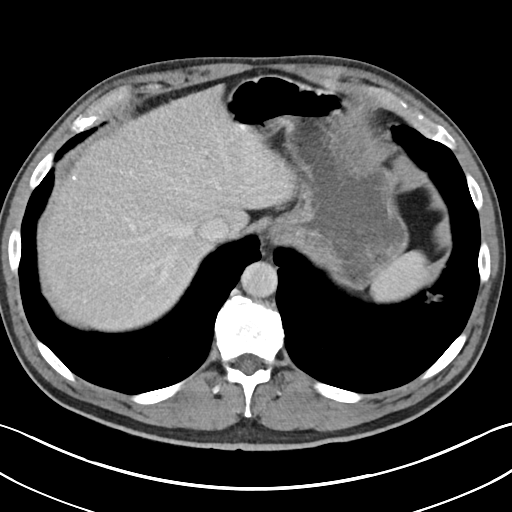
[im 77/83  soft-tissue]
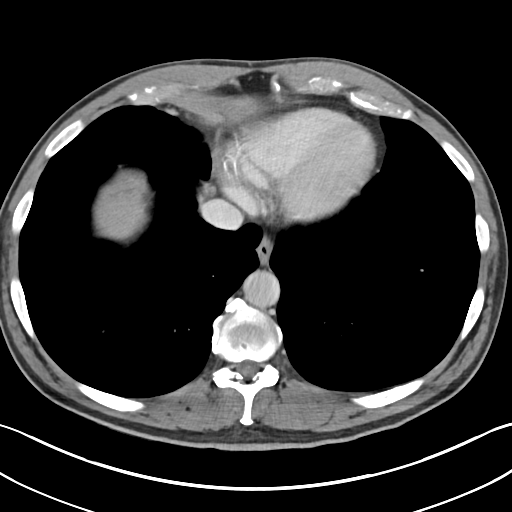

[Series 6: abd pelvis 2.00 br40 s3 cor · coronal · 0.73mm/px · 3 of 158 slices shown]
[im 53/158  soft-tissue]
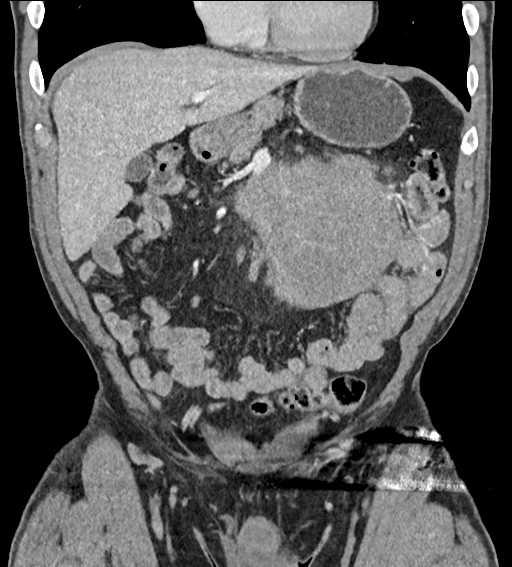
[im 70/158  soft-tissue]
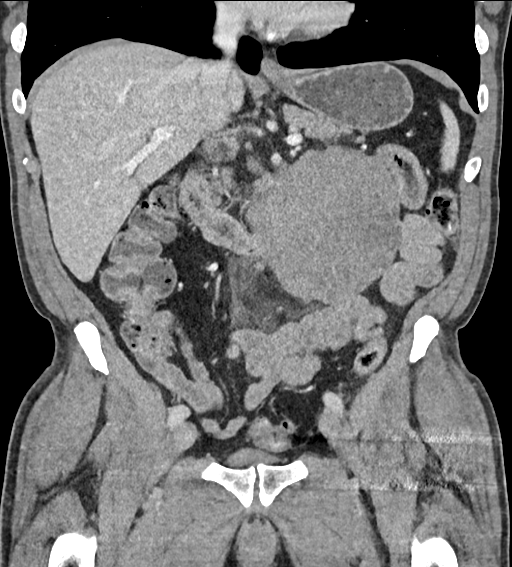
[im 88/158  soft-tissue]
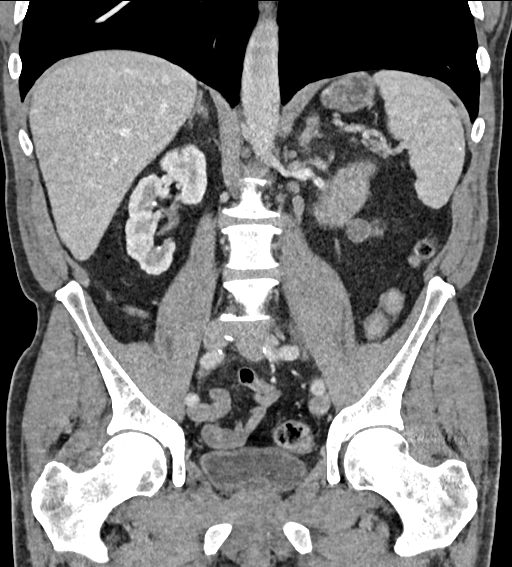

[15 of 46 positions shown; findings below may reference images not displayed]

FINDINGS: Lower chest: Multiple left lower lobe pleural-based pulmonary
nodules, including at 1.0 cm on [DATE] and 7 mm on [DATE]. Normal heart
size without pericardial or pleural effusion. Normal heart size
without pericardial or pleural effusion. Distal right coronary
artery atherosclerosis.

Hepatobiliary: Normal liver. Stone within the gallbladder fundus of
6 mm. No acute cholecystitis or biliary duct dilatation.

Pancreas: Normal, without mass or ductal dilatation.

Spleen: Normal in size, without focal abnormality.

Adrenals/Urinary Tract: Normal right adrenal gland. Mild left
adrenal nodularity. Multiple bilateral renal collecting system
calculi, on the order of 4 mm and less. Right renal too small to
characterize lesions. An exophytic anterior interpolar left renal
1.4 cm lesion measures greater than fluid density, including on 3[REDACTED]. Normal urinary bladder.

Stomach/Bowel: Normal stomach, without wall thickening.
Periampullary duodenal diverticulum. No small bowel obstruction.
Normal terminal ileum and appendix. Apparent soft tissue fullness at
and just cephalad the ileocecal valve, within the ascending colon on
47/2 on coronal image 68.

Vascular/Lymphatic: Aortic atherosclerosis. Prominent but not
pathologically sized abdominal retroperitoneal nodes. Example left
periaortic 9 mm node on 37/2. A dominant nodal mass within the
jejunal mesentery measures 11.5 x 10.9 cm on 32/2. Smaller
surrounding nodes, including at 7 mm on [DATE].

No pelvic sidewall adenopathy.

Reproductive: Mild prostatomegaly.

Other: Trace free pelvic fluid. Tiny fat containing right inguinal
hernia. No free intraperitoneal air.

Musculoskeletal: Bilateral L5 pars defects. Advanced degenerate disc
disease at this level with grade 2 L5-S1 anterolisthesis.
IMPRESSION: 1. Dominant nodal mass within the jejunal mesentery, highly
suspicious for lymphoma. Given the clinical history of melanoma,
metastatic disease is possible but felt less likely.
2. Left lower lobe pleural-based pulmonary nodules, for which
metastatic disease or lymphoma is occluded. Consider complete
staging with chest CT or PET.
3. Apparent soft tissue fullness at and just cephalad the ileocecal
valve. Cannot exclude primary colonic carcinoma. Correlate with
colon cancer screening history and possibly optical colonoscopy.
4. Coronary artery atherosclerosis. Aortic Atherosclerosis
([id number]).
5. Cholelithiasis.
6. Bilateral nephrolithiasis.
7. Exophytic left renal lesion which measures greater than fluid
density. Complex cyst or solid neoplasm. This could be re-evaluated
at follow-up or more entirely characterized with dedicated pre and
post contrast abdominal MRI.

These results will be called to the ordering clinician or
representative by the Radiologist Assistant, and communication
documented in the PACS or [REDACTED].

## 2022-04-06 DIAGNOSIS — C44319 Basal cell carcinoma of skin of other parts of face: Secondary | ICD-10-CM | POA: Diagnosis not present

## 2022-04-12 ENCOUNTER — Other Ambulatory Visit: Payer: Self-pay

## 2022-04-20 ENCOUNTER — Other Ambulatory Visit: Payer: Self-pay

## 2022-04-27 ENCOUNTER — Other Ambulatory Visit: Payer: Self-pay | Admitting: Hematology and Oncology

## 2022-04-27 DIAGNOSIS — C8333 Diffuse large B-cell lymphoma, intra-abdominal lymph nodes: Secondary | ICD-10-CM

## 2022-04-28 ENCOUNTER — Inpatient Hospital Stay: Payer: Medicare PPO | Attending: Hematology and Oncology

## 2022-04-28 ENCOUNTER — Inpatient Hospital Stay: Payer: Medicare PPO | Admitting: Hematology and Oncology

## 2022-04-28 ENCOUNTER — Other Ambulatory Visit: Payer: Self-pay

## 2022-04-28 VITALS — BP 180/92 | HR 54 | Temp 98.1°F | Resp 18 | Ht 67.0 in | Wt 188.3 lb

## 2022-04-28 DIAGNOSIS — Z79899 Other long term (current) drug therapy: Secondary | ICD-10-CM | POA: Diagnosis not present

## 2022-04-28 DIAGNOSIS — R918 Other nonspecific abnormal finding of lung field: Secondary | ICD-10-CM | POA: Insufficient documentation

## 2022-04-28 DIAGNOSIS — C8333 Diffuse large B-cell lymphoma, intra-abdominal lymph nodes: Secondary | ICD-10-CM

## 2022-04-28 DIAGNOSIS — C8338 Diffuse large B-cell lymphoma, lymph nodes of multiple sites: Secondary | ICD-10-CM | POA: Insufficient documentation

## 2022-04-28 DIAGNOSIS — Z95828 Presence of other vascular implants and grafts: Secondary | ICD-10-CM

## 2022-04-28 LAB — CMP (CANCER CENTER ONLY)
ALT: 26 U/L (ref 0–44)
AST: 25 U/L (ref 15–41)
Albumin: 4.5 g/dL (ref 3.5–5.0)
Alkaline Phosphatase: 59 U/L (ref 38–126)
Anion gap: 5 (ref 5–15)
BUN: 21 mg/dL (ref 8–23)
CO2: 31 mmol/L (ref 22–32)
Calcium: 9.2 mg/dL (ref 8.9–10.3)
Chloride: 105 mmol/L (ref 98–111)
Creatinine: 1.29 mg/dL — ABNORMAL HIGH (ref 0.61–1.24)
GFR, Estimated: 58 mL/min — ABNORMAL LOW (ref >60)
Glucose, Bld: 105 mg/dL — ABNORMAL HIGH (ref 70–99)
Potassium: 4.3 mmol/L (ref 3.5–5.1)
Sodium: 141 mmol/L (ref 135–145)
Total Bilirubin: 0.5 mg/dL (ref 0.3–1.2)
Total Protein: 7.4 g/dL (ref 6.5–8.1)

## 2022-04-28 LAB — CBC WITH DIFFERENTIAL (CANCER CENTER ONLY)
Abs Immature Granulocytes: 0.01 10*3/uL (ref 0.00–0.07)
Basophils Absolute: 0.1 10*3/uL (ref 0.0–0.1)
Basophils Relative: 1 %
Eosinophils Absolute: 0.1 10*3/uL (ref 0.0–0.5)
Eosinophils Relative: 2 %
HCT: 42.6 % (ref 39.0–52.0)
Hemoglobin: 14 g/dL (ref 13.0–17.0)
Immature Granulocytes: 0 %
Lymphocytes Relative: 40 %
Lymphs Abs: 2.4 10*3/uL (ref 0.7–4.0)
MCH: 31.2 pg (ref 26.0–34.0)
MCHC: 32.9 g/dL (ref 30.0–36.0)
MCV: 94.9 fL (ref 80.0–100.0)
Monocytes Absolute: 0.4 10*3/uL (ref 0.1–1.0)
Monocytes Relative: 7 %
Neutro Abs: 3 10*3/uL (ref 1.7–7.7)
Neutrophils Relative %: 50 %
Platelet Count: 176 10*3/uL (ref 150–400)
RBC: 4.49 MIL/uL (ref 4.22–5.81)
RDW: 14.3 % (ref 11.5–15.5)
WBC Count: 6 10*3/uL (ref 4.0–10.5)
nRBC: 0 % (ref 0.0–0.2)

## 2022-04-28 LAB — LACTATE DEHYDROGENASE: LDH: 160 U/L (ref 98–192)

## 2022-04-28 NOTE — Progress Notes (Signed)
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   1) 04/17/2020:  CT A/P showed dominant nodal mass (11.5 x 10.9 cm) within the jejunal mesentery, highly suspicious for lymphoma. Additionally there are left lower lobe pleural-based pulmonary nodules 2) 04/24/2020: establish care with Dr. Lorenso Courier 3) 04/28/2020:  PET CT scan shows Large solid left mesenteric mass 13.0 cm with maximum SUV of 21.2, Deauville 5 with local hypermetabolic porta hepatis and retroperitoneal lymph nodes. Constitutes bulky Stage I disease.  4) 05/27/2020-05/31/2020: Cycle 1 of R-EPOCH 5) 06/16/2020-06/23/2020: Cycle 2 of R-EPOCH 6) 07/07/2020-07/11/2020: Cycle 3 of R-EPOCH  7) 07/28/2020-08/01/2020: Cycle 4 of R-EPOCH 8) 08/18/2020-08/22/2020: Cycle 5 of R-EPOCH 9) 09/08/2020-09/12/2020: Cycle 6 of R-EPOCH 10) 10/06/2020: PET CT scan shows decreased size of dominant mesenteric mass, however there was hypermetabolic activity within a single left anterior mesenteric lymph node  11) 12/31/2020: PET CT scan showed hypermetabolic nodular thickening the medial aspect of the RIGHT lower lobe is concerning for neoplasm versus atypical inflammatory response 12) 03/27/2021: PET CT Scan showed progression of disease compared with 12/30/2020. Multiple mediastinal and hilar lymph nodes exhibit new FDG uptake compatible with Deauville criteria 5. 13) 04/15/2021: bronchoscopy performed with biopsy of mediastinal lymph nodes.  Findings consistent with granulomatous disease.  No evidence of recurrence. 14) 07/22/2021: PET CT scan showed mediastinal, hilar, abdominal and inguinal lymph nodes, compatible with the provided history of lymphoma (overall Deauville 4). 15) 10/21/2021: CT scan shows no clear evidence of recurrence of lymphoma. Stable  previously note lymph nodes and lung nodules.   Interval History:  Connor Adams 76 y.o. male with medical history significant for Stage I DLBCL who presents for a follow up visit. The patient was last seen on 01/26/2022 .  In the interim since his last visit he has had no major changes in his health.   On exam today Mr. Blazier is unaccompanied.  He reports he has been quite well interim since her last visit.  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  He reports there is a numbness to creeps up in the leg and with movement this improves.  He reports he feels like there is marbles in his feet when he walks barefoot on carpet.  He notes he had does have some sinus issues in the morning and a feeling of being "off".  Typically with limited movement in his morning coffee he feels considerably better.  He has has also been using Mucinex to help with his nasal congestion.  He reports his energy is quite good.  He is doing his best to stay hydrated but notes he does not like Gatorade due to the salt content.  His appetite has been good and overall he feels quite well.  Otherwise he has no questions, concerns or complaints.  He currently denies having issues with fevers, chills, sweats, nausea, vomiting or diarrhea.  A full 10 point ROS is listed below.  MEDICAL HISTORY:  Past Medical History:  Diagnosis Date   Arthritis    Chronic kidney disease (CKD), stage III (moderate) (HCC)    COVID-19    GERD (gastroesophageal reflux disease)    occ   Hemorrhoids    History of ETT 3/05   low risk   History of hiatal  hernia    History of kidney stones    HLD (hyperlipidemia)    HTN (hypertension)    Left inguinal hernia    Melanoma ([place])    excied   Sciatica    from lumbar disc disease   Stroke ([place]) 2011   tia    TIA (transient ischemic attack) 4/11   associated w slurred speecha ndmild facial droop. lasted for about 10 minutes. Had MRI, etc with  [facility] that per his report was ok (dr. Stephannie Li). Echo (4/11): EF 55-60%, no regional WMAs, normal diastolic function, mild LAE, no source of embolus    SURGICAL HISTORY: Past Surgical History:  Procedure Laterality Date   BMI 30.2 muscular  12/08/04   BRONCHIAL BRUSHINGS  04/15/2021   Procedure: BRONCHIAL BRUSHINGS;  Surgeon: Margaretha Seeds, MD;  Location: [facility];  Service: Pulmonary;;   BRONCHIAL NEEDLE ASPIRATION BIOPSY  04/15/2021   Procedure: BRONCHIAL NEEDLE ASPIRATION BIOPSIES;  Surgeon: Margaretha Seeds, MD;  Location: [facility] ENDOSCOPY;  Service: Pulmonary;;   curretage actinic keratosis R ear  01/20/05   ENDOBRONCHIAL ULTRASOUND N/A 04/15/2021   Procedure: ENDOBRONCHIAL ULTRASOUND;  Surgeon: Margaretha Seeds, MD;  Location: [facility];  Service: Pulmonary;  Laterality: N/A;   ETT low prob of CAD  11/21/03   excision melanoma in situ back  01/20/05   hemorrhoids sclerosed at colonoscopy  12/19/05   INGUINAL HERNIA REPAIR Right 11/20/2021   Procedure: LAPAROSCOPIC RIGHT  INGUINAL HERNIA REPAIR WITH MESH;  Surgeon: Stechschulte, Nickola Major, MD;  Location: [facility];  Service: General;  Laterality: Right;   IR IMAGING GUIDED PORT INSERTION  05/19/2020   IR REMOVAL TUN ACCESS W/ PORT W/O FL MOD SED  02/02/2022   LAPAROSCOPIC INGUINAL HERNIA REPAIR Left 12/20/1995   LUMBAR DISC SURGERY     NERVE REPAIR     back of neck   retinal laser surgery Left 09/20/1986   SEPTOPLASTY  11/19/98   TOTAL KNEE ARTHROPLASTY Left 12/13/2016   Procedure: TOTAL KNEE ARTHROPLASTY WITH RIGHT KNEE CORTISONE INJECTION;  Surgeon: Frederik Pear, MD;  Location: [facility];  Service: Orthopedics;  Laterality: Left;   uric acid 6.9  12/22/04   x-ray l great toe  07/21/00    SOCIAL HISTORY: Social History   Socioeconomic History   Marital status: Married    Spouse name: [name]   Number of children: 2   Years of education: 12   Highest education level: Not on file  Occupational History    Occupation: Systems developer: [employer]  Tobacco Use   Smoking status: Never   Smokeless tobacco: Never  Vaping Use   Vaping Use: Never used  Substance and Sexual Activity   Alcohol use: No   Drug use: No   Sexual activity: Yes  Other Topics Concern   Not on file  Social History Narrative   Married to [employer] at [employer].   Son, [name] married   Son, [name], Married.    Caffeine use: 1 cup coffee per day   Social Determinants of Health   Financial Resource Strain: Not on file  Food Insecurity: Not on file  Transportation Needs: Not on file  Physical Activity: Not on file  Stress: Not on file  Social Connections: Not on file  Intimate Partner Violence: Not on file    FAMILY HISTORY: Family History  Problem Relation Age of Onset   Liver cancer Sister    Heart  failure Brother 16       CABGx4   Heart attack Mother 67   Uterine cancer Mother    Heart attack Father 44   Asthma Son    Diabetes Brother    Prostate cancer Brother     ALLERGIES:  has No Known Allergies.  MEDICATIONS:  Current Outpatient Medications  Medication Sig Dispense Refill   acetaminophen (TYLENOL) 325 MG tablet Take 2 tablets (650 mg total) by mouth every 6 (six) hours as needed for mild pain, moderate pain or fever.     albuterol (VENTOLIN HFA) 108 (90 Base) MCG/ACT inhaler Inhale 1-2 puffs into the lungs every 6 (six) hours as needed for wheezing or shortness of breath.     amLODipine (NORVASC) 5 MG tablet Take 5 mg by mouth in the morning.     aspirin EC 81 MG tablet Take 81 mg by mouth in the morning. Swallow whole.     fluticasone (FLONASE) 50 MCG/ACT nasal spray Place 1-2 sprays into both nostrils daily as needed for allergies or rhinitis.  2   indomethacin (INDOCIN) 50 MG capsule Take 50 mg by mouth 3 (three) times daily as needed (GOUT).     loratadine (CLARITIN) 10 MG tablet Take by mouth daily as needed for allergies.     Multiple Vitamin  (MULTIVITAMIN WITH MINERALS) TABS tablet Take 1 tablet by mouth in the morning.     Omega-3 Fatty Acids (FISH OIL) 600 MG CAPS Take 600 mg by mouth in the morning.     polyvinyl alcohol (LIQUIFILM TEARS) 1.4 % ophthalmic solution Place 1 drop into both eyes in the morning.     rosuvastatin (CRESTOR) 5 MG tablet Take 5 mg by mouth in the morning.     No current facility-administered medications for this visit.    REVIEW OF SYSTEMS:   Constitutional: ( - ) fevers, ( - )  chills , ( - ) night sweats Eyes: ( - ) blurriness of vision, ( - ) double vision, ( - ) watery eyes Ears, nose, mouth, throat, and face: ( - ) mucositis, ( - ) sore throat Respiratory: ( - ) cough, ( - ) dyspnea, ( - ) wheezes Cardiovascular: ( - ) palpitation, ( - ) chest discomfort, ( - ) lower extremity swelling Gastrointestinal:  ( - ) nausea, ( - ) heartburn, ( - ) change in bowel habits Skin: ( - ) abnormal skin rashes Lymphatics: ( - ) new lymphadenopathy, ( - ) easy bruising Neurological: ( - ) numbness, ( - ) tingling, ( - ) new weaknesses Behavioral/Psych: ( - ) mood change, ( - ) new changes  All other systems were reviewed with the patient and are negative.  PHYSICAL EXAMINATION: ECOG PERFORMANCE STATUS: 1 - Symptomatic but completely ambulatory  Vitals:   04/28/22 1110  BP: (!) 180/92  Pulse: (!) 54  Resp: 18  Temp: 98.1 F (36.7 C)  SpO2: 100%    Filed Weights   04/28/22 1110  Weight: 188 lb 4.8 oz (85.4 kg)    GENERAL: well appearing elderly Caucasian male. alert, no distress and comfortable SKIN: skin color, texture, turgor are normal, no rashes or significant lesions.  EYES: conjunctiva are pink and non-injected, sclera clear LUNGS: clear to auscultation and percussion with normal breathing effort HEART: regular rate & rhythm and no murmurs and trace lower extremity edema Musculoskeletal: no cyanosis of digits and no clubbing  PSYCH: alert & oriented x 3, fluent speech NEURO: no focal  motor/sensory  deficits  LABORATORY DATA:  I have reviewed the data as listed    Latest Ref Rng & Units 04/28/2022   10:28 AM 01/26/2022    9:26 AM 10/23/2021   10:25 AM  CBC  WBC 4.0 - 10.5 K/uL 6.0  6.7  5.9   Hemoglobin 13.0 - 17.0 g/dL 14.0  14.0  13.7   Hematocrit 39.0 - 52.0 % 42.6  42.2  41.4   Platelets 150 - 400 K/uL 176  172  168        Latest Ref Rng & Units 04/28/2022   10:28 AM 01/26/2022    9:26 AM 01/15/2022   11:08 AM  CMP  Glucose 70 - 99 mg/dL 105  97    BUN 8 - 23 mg/dL 21  <5    Creatinine 0.61 - 1.24 mg/dL 1.29  <0.30  1.40   Sodium 135 - 145 mmol/L 141  143    Potassium 3.5 - 5.1 mmol/L 4.3  4.3    Chloride 98 - 111 mmol/L 105  106    CO2 22 - 32 mmol/L 31  27    Calcium 8.9 - 10.3 mg/dL 9.2  9.5    Total Protein 6.5 - 8.1 g/dL 7.4  7.4    Total Bilirubin 0.3 - 1.2 mg/dL 0.5  0.6    Alkaline Phos 38 - 126 U/L 59  62    AST 15 - 41 U/L 25  28    ALT 0 - 44 U/L 26  20      RADIOGRAPHIC STUDIES: I have personally reviewed the radiological images as listed and agreed with the findings in the report: CT scan 01/15/2022 showed no evidence of residual/recurrent disease.   No results found.  ASSESSMENT & PLAN Connor Adams 76 y.o. male with medical history significant for Stage I DLBCL who presents for a follow up visit.  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  At this time he has completed all planned chemotherapy treatment (R-EPOCH x 6 cycles).   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  --findings are consistent with a double hit DLBCL Treatment of choice is R-EPOCH chemotherapy in the inpatient setting. --he is currently s/p Cycle 6 administered on  09/08/2020 --patient has a port in place. TTE was performed and showed strong baseline heart function. --PET CT scan on 04/16/2021 showed concern for progressive disease in the chest. This was also seen on most recent scan from 07/22/2021. Prior biopsy showed granulomatous tissue with no evidence of lymphoma. At this time suspect the FDG avid areas are inflammatory and not lymphomatous.  --biopsy via bronchoscopy shows no clear evidence of recurrent disease.  Findings are most consistent with granulomatous, inflammatory tissue. --case previously discussed with Dr. Cassell Clement of [facility]. She recommendations if recurrence is found that we consider initiation of rituximab/polatuzumab. She will schedule an appointment to see if CAR-T therapy may be an option. Plan:  -- routine CBC, CMP and LDH at each clinic visit. Labs today show WBC 6.0, Hgb 14.0, MCV 94.9, Plt 176 --continue q 3 month follow up and labs until Dec 2023, at which time we can transition to q 6 month clinic visits  -- Return to clinic  in 3 months with interval repeat CT scan in late Oct 2023.   #Lung Nodules, stable -- noted on interim PET CT scan after Cycle 4.  --unclear if these represent areas of infection, metastatic disease, or progression of lymphoma --pulmonology consulted, appreciate their input.  --will monitor respiratory status closely  # Port in Place -port removed 02/02/2022  Orders Placed This Encounter  Procedures   CT CHEST ABDOMEN PELVIS W CONTRAST    Standing Status:   Future    Standing Expiration Date:   04/29/2023    Order Specific Question:   Preferred imaging location?    Answer:   [facility]    Order Specific Question:   Is Oral Contrast requested for this exam?    Answer:   Yes, Per Radiology protocol    All questions were answered. The patient knows to call the clinic with any problems, questions or concerns.  A total of more than 30 minutes were spent on this encounter and over half of that time  was spent on counseling and coordination of care as outlined above.   [name], MD Department of Hematology/Oncology [place] [facility] Phone: [phone] Pager: [phone] Email: [email or web address]  04/28/2022 4:41 PM

## 2022-04-28 NOTE — Progress Notes (Signed)
Fax'd urology referral to [facility]

## 2022-04-29 ENCOUNTER — Telehealth: Payer: Self-pay | Admitting: Hematology and Oncology

## 2022-04-29 NOTE — Telephone Encounter (Signed)
Per 8/9 los called and spoke to pt about appointment  pt confirmed appointment

## 2022-04-30 ENCOUNTER — Other Ambulatory Visit: Payer: Self-pay

## 2022-05-10 ENCOUNTER — Other Ambulatory Visit: Payer: Self-pay

## 2022-05-10 NOTE — Patient Outreach (Signed)
[facility]) Care Management  05/10/2022  Connor Adams Jan 04, 1946 [id number]   Telephone call to patient for nurse call. Wife states patient not in.  CM contact information given.    Plan: RN CM will attempt again within 4 business days and send letter.  Jone Baseman, RN, MSN [facility] Care Management Care Management Coordinator Direct Line [phone] Toll Free: [phone]  Fax: [phone]

## 2022-05-12 DIAGNOSIS — N183 Chronic kidney disease, stage 3 unspecified: Secondary | ICD-10-CM | POA: Diagnosis not present

## 2022-05-12 DIAGNOSIS — Z0000 Encounter for general adult medical examination without abnormal findings: Secondary | ICD-10-CM | POA: Diagnosis not present

## 2022-05-12 DIAGNOSIS — E7800 Pure hypercholesterolemia, unspecified: Secondary | ICD-10-CM | POA: Diagnosis not present

## 2022-05-12 DIAGNOSIS — Z23 Encounter for immunization: Secondary | ICD-10-CM | POA: Diagnosis not present

## 2022-05-12 DIAGNOSIS — Z1211 Encounter for screening for malignant neoplasm of colon: Secondary | ICD-10-CM | POA: Diagnosis not present

## 2022-05-12 DIAGNOSIS — Z8579 Personal history of other malignant neoplasms of lymphoid, hematopoietic and related tissues: Secondary | ICD-10-CM | POA: Diagnosis not present

## 2022-05-12 DIAGNOSIS — I10 Essential (primary) hypertension: Secondary | ICD-10-CM | POA: Diagnosis not present

## 2022-05-12 DIAGNOSIS — R7303 Prediabetes: Secondary | ICD-10-CM | POA: Diagnosis not present

## 2022-05-12 DIAGNOSIS — J019 Acute sinusitis, unspecified: Secondary | ICD-10-CM | POA: Diagnosis not present

## 2022-05-13 ENCOUNTER — Other Ambulatory Visit: Payer: Self-pay

## 2022-05-13 NOTE — Patient Outreach (Signed)
[facility]) Care Management  05/13/2022  Connor Adams 07-07-1946 [id number]   Telephone call to patient for nurse call. Patient reports he is doing well and staying active.  Discussed [facility] services and support ongoing. Patient declined at this time.    Discussed need for annual wellness visit.  Patient states he had that on yesterday.    Plan: RN CM will close case.   Jone Baseman, RN, MSN [facility] Care Management Care Management Coordinator Direct Line [phone] Toll Free: [phone]  Fax: [phone]

## 2022-05-19 DIAGNOSIS — Z1211 Encounter for screening for malignant neoplasm of colon: Secondary | ICD-10-CM | POA: Diagnosis not present

## 2022-06-04 DIAGNOSIS — Z85828 Personal history of other malignant neoplasm of skin: Secondary | ICD-10-CM | POA: Diagnosis not present

## 2022-06-04 DIAGNOSIS — X32XXXD Exposure to sunlight, subsequent encounter: Secondary | ICD-10-CM | POA: Diagnosis not present

## 2022-06-04 DIAGNOSIS — Z08 Encounter for follow-up examination after completed treatment for malignant neoplasm: Secondary | ICD-10-CM | POA: Diagnosis not present

## 2022-06-04 DIAGNOSIS — L570 Actinic keratosis: Secondary | ICD-10-CM | POA: Diagnosis not present

## 2022-06-04 DIAGNOSIS — C44329 Squamous cell carcinoma of skin of other parts of face: Secondary | ICD-10-CM | POA: Diagnosis not present

## 2022-07-05 DIAGNOSIS — Z85828 Personal history of other malignant neoplasm of skin: Secondary | ICD-10-CM | POA: Diagnosis not present

## 2022-07-05 DIAGNOSIS — Z08 Encounter for follow-up examination after completed treatment for malignant neoplasm: Secondary | ICD-10-CM | POA: Diagnosis not present

## 2022-07-05 DIAGNOSIS — X32XXXD Exposure to sunlight, subsequent encounter: Secondary | ICD-10-CM | POA: Diagnosis not present

## 2022-07-05 DIAGNOSIS — L570 Actinic keratosis: Secondary | ICD-10-CM | POA: Diagnosis not present

## 2022-07-07 DIAGNOSIS — Z8669 Personal history of other diseases of the nervous system and sense organs: Secondary | ICD-10-CM | POA: Diagnosis not present

## 2022-07-07 DIAGNOSIS — Z961 Presence of intraocular lens: Secondary | ICD-10-CM | POA: Diagnosis not present

## 2022-07-07 DIAGNOSIS — H2512 Age-related nuclear cataract, left eye: Secondary | ICD-10-CM | POA: Diagnosis not present

## 2022-07-07 DIAGNOSIS — H10011 Acute follicular conjunctivitis, right eye: Secondary | ICD-10-CM | POA: Diagnosis not present

## 2022-07-26 ENCOUNTER — Ambulatory Visit (HOSPITAL_COMMUNITY)
Admission: RE | Admit: 2022-07-26 | Discharge: 2022-07-26 | Disposition: A | Discharge status: Home / Self Care | Payer: Medicare PPO | Source: Ambulatory Visit | Attending: Hematology and Oncology | Admitting: Hematology and Oncology

## 2022-07-26 DIAGNOSIS — C8333 Diffuse large B-cell lymphoma, intra-abdominal lymph nodes: Secondary | ICD-10-CM | POA: Diagnosis not present

## 2022-07-26 DIAGNOSIS — N200 Calculus of kidney: Secondary | ICD-10-CM | POA: Diagnosis not present

## 2022-07-26 DIAGNOSIS — R918 Other nonspecific abnormal finding of lung field: Secondary | ICD-10-CM | POA: Diagnosis not present

## 2022-07-26 DIAGNOSIS — C8330 Diffuse large B-cell lymphoma, unspecified site: Secondary | ICD-10-CM | POA: Diagnosis not present

## 2022-07-26 DIAGNOSIS — S2242XA Multiple fractures of ribs, left side, initial encounter for closed fracture: Secondary | ICD-10-CM | POA: Diagnosis not present

## 2022-07-26 DIAGNOSIS — I700 Atherosclerosis of aorta: Secondary | ICD-10-CM | POA: Diagnosis not present

## 2022-07-26 DIAGNOSIS — K8020 Calculus of gallbladder without cholecystitis without obstruction: Secondary | ICD-10-CM | POA: Diagnosis not present

## 2022-07-26 DIAGNOSIS — N3289 Other specified disorders of bladder: Secondary | ICD-10-CM | POA: Diagnosis not present

## 2022-07-26 MED ORDER — IOHEXOL 9 MG/ML PO SOLN
1000.0000 mL | Freq: Once | ORAL | Status: AC
  Administered 2022-07-26: 1000 mL via ORAL

## 2022-07-26 MED ORDER — IOHEXOL 300 MG/ML  SOLN
100.0000 mL | Freq: Once | INTRAMUSCULAR | Status: AC | PRN
Start: 2022-07-26 — End: 2022-07-26
  Administered 2022-07-26: 100 mL via INTRAVENOUS

## 2022-07-30 ENCOUNTER — Telehealth: Payer: Self-pay | Admitting: *Deleted

## 2022-07-30 NOTE — Telephone Encounter (Signed)
-----   Message from Orson Slick, MD sent at 07/28/2022  2:19 PM EST ----- Please let Connor Adams know that his CT scan showed no evidence of new or recurrent disease. We will see him as scheduled next week.   ----- Message ----- From: Interface, Rad Results In Sent: 07/28/2022   1:59 PM EST To: Orson Slick, MD

## 2022-07-30 NOTE — Telephone Encounter (Signed)
TCT patient regarding his latest CT scan results. Spoke with him. Advised that his CT scan showed no new or recurrent disease. Pt very pleased. He is aware of his f/u appt on 08/02/22

## 2022-08-02 ENCOUNTER — Other Ambulatory Visit: Payer: Self-pay | Admitting: Hematology and Oncology

## 2022-08-02 ENCOUNTER — Inpatient Hospital Stay: Payer: Medicare PPO | Admitting: Hematology and Oncology

## 2022-08-02 ENCOUNTER — Inpatient Hospital Stay: Payer: Medicare PPO | Attending: Hematology and Oncology

## 2022-08-02 ENCOUNTER — Other Ambulatory Visit: Payer: Self-pay

## 2022-08-02 VITALS — BP 195/81 | HR 52 | Temp 97.6°F | Resp 17 | Wt 191.6 lb

## 2022-08-02 DIAGNOSIS — Z79899 Other long term (current) drug therapy: Secondary | ICD-10-CM | POA: Diagnosis not present

## 2022-08-02 DIAGNOSIS — R918 Other nonspecific abnormal finding of lung field: Secondary | ICD-10-CM

## 2022-08-02 DIAGNOSIS — C8338 Diffuse large B-cell lymphoma, lymph nodes of multiple sites: Secondary | ICD-10-CM | POA: Insufficient documentation

## 2022-08-02 DIAGNOSIS — C8333 Diffuse large B-cell lymphoma, intra-abdominal lymph nodes: Secondary | ICD-10-CM

## 2022-08-02 DIAGNOSIS — N183 Chronic kidney disease, stage 3 unspecified: Secondary | ICD-10-CM | POA: Insufficient documentation

## 2022-08-02 DIAGNOSIS — Z85820 Personal history of malignant melanoma of skin: Secondary | ICD-10-CM | POA: Insufficient documentation

## 2022-08-02 DIAGNOSIS — I129 Hypertensive chronic kidney disease with stage 1 through stage 4 chronic kidney disease, or unspecified chronic kidney disease: Secondary | ICD-10-CM | POA: Insufficient documentation

## 2022-08-02 LAB — CMP (CANCER CENTER ONLY)
ALT: 21 U/L (ref 0–44)
AST: 23 U/L (ref 15–41)
Albumin: 4.7 g/dL (ref 3.5–5.0)
Alkaline Phosphatase: 50 U/L (ref 38–126)
Anion gap: 6 (ref 5–15)
BUN: 21 mg/dL (ref 8–23)
CO2: 30 mmol/L (ref 22–32)
Calcium: 9.5 mg/dL (ref 8.9–10.3)
Chloride: 106 mmol/L (ref 98–111)
Creatinine: 1.35 mg/dL — ABNORMAL HIGH (ref 0.61–1.24)
GFR, Estimated: 54 mL/min — ABNORMAL LOW (ref >60)
Glucose, Bld: 99 mg/dL (ref 70–99)
Potassium: 4.1 mmol/L (ref 3.5–5.1)
Sodium: 142 mmol/L (ref 135–145)
Total Bilirubin: 0.6 mg/dL (ref 0.3–1.2)
Total Protein: 7.5 g/dL (ref 6.5–8.1)

## 2022-08-02 LAB — CBC WITH DIFFERENTIAL (CANCER CENTER ONLY)
Abs Immature Granulocytes: 0.01 10*3/uL (ref 0.00–0.07)
Basophils Absolute: 0.1 10*3/uL (ref 0.0–0.1)
Basophils Relative: 1 %
Eosinophils Absolute: 0.2 10*3/uL (ref 0.0–0.5)
Eosinophils Relative: 3 %
HCT: 45.9 % (ref 39.0–52.0)
Hemoglobin: 15.4 g/dL (ref 13.0–17.0)
Immature Granulocytes: 0 %
Lymphocytes Relative: 38 %
Lymphs Abs: 2.1 10*3/uL (ref 0.7–4.0)
MCH: 32.5 pg (ref 26.0–34.0)
MCHC: 33.6 g/dL (ref 30.0–36.0)
MCV: 96.8 fL (ref 80.0–100.0)
Monocytes Absolute: 0.3 10*3/uL (ref 0.1–1.0)
Monocytes Relative: 6 %
Neutro Abs: 2.9 10*3/uL (ref 1.7–7.7)
Neutrophils Relative %: 52 %
Platelet Count: 159 10*3/uL (ref 150–400)
RBC: 4.74 MIL/uL (ref 4.22–5.81)
RDW: 13.3 % (ref 11.5–15.5)
WBC Count: 5.6 10*3/uL (ref 4.0–10.5)
nRBC: 0 % (ref 0.0–0.2)

## 2022-08-02 LAB — LACTATE DEHYDROGENASE: LDH: 143 U/L (ref 98–192)

## 2022-08-02 NOTE — Progress Notes (Signed)
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   1) 04/17/2020:  CT A/P showed dominant nodal mass (11.5 x 10.9 cm) within the jejunal mesentery, highly suspicious for lymphoma. Additionally there are left lower lobe pleural-based pulmonary nodules 2) 04/24/2020: establish care with Dr. Lorenso Courier 3) 04/28/2020:  PET CT scan shows Large solid left mesenteric mass 13.0 cm with maximum SUV of 21.2, Deauville 5 with local hypermetabolic porta hepatis and retroperitoneal lymph nodes. Constitutes bulky Stage I disease.  4) 05/27/2020-05/31/2020: Cycle 1 of R-EPOCH 5) 06/16/2020-06/23/2020: Cycle 2 of R-EPOCH 6) 07/07/2020-07/11/2020: Cycle 3 of R-EPOCH  7) 07/28/2020-08/01/2020: Cycle 4 of R-EPOCH 8) 08/18/2020-08/22/2020: Cycle 5 of R-EPOCH 9) 09/08/2020-09/12/2020: Cycle 6 of R-EPOCH 10) 10/06/2020: PET CT scan shows decreased size of dominant mesenteric mass, however there was hypermetabolic activity within a single left anterior mesenteric lymph node  11) 12/31/2020: PET CT scan showed hypermetabolic nodular thickening the medial aspect of the RIGHT lower lobe is concerning for neoplasm versus atypical inflammatory response 12) 03/27/2021: PET CT Scan showed progression of disease compared with 12/30/2020. Multiple mediastinal and hilar lymph nodes exhibit new FDG uptake compatible with Deauville criteria 5. 13) 04/15/2021: bronchoscopy performed with biopsy of mediastinal lymph nodes.  Findings consistent with granulomatous disease.  No evidence of recurrence. 14) 07/22/2021: PET CT scan showed mediastinal, hilar, abdominal and inguinal lymph nodes, compatible with the provided history of lymphoma (overall Deauville 4). 15) 10/21/2021: CT scan shows no clear evidence of recurrence of lymphoma. Stable  previously note lymph nodes and lung nodules.   Interval History:  Connor Adams 76 y.o. male with medical history significant for Stage I DLBCL who presents for a follow up visit. The patient was last seen on 04/28/2022 .  In the interim since his last visit he has had no major changes in his health.   On exam today Connor Adams is unaccompanied.  He reports he has been great in the interim since our last visit.  He reports that he has some occasional "old person aches and pains".  He his energy is "too good".  He notes he is eating quite well.  He is working out 3 times a week and twice a week is walking 2.5 miles on his treadmill.  He denies any bumps or lumps or concerning systemic symptoms.  He feels great.  His wife however continues to have difficulty following her hip fracture and teeth extraction.  He is very excited about the progress he has made on his medical device which is currently pending patent.  Otherwise he has no questions, concerns or complaints.  He currently denies having issues with fevers, chills, sweats, nausea, vomiting or diarrhea.  A full 10 point ROS is listed below.  MEDICAL HISTORY:  Past Medical History:  Diagnosis Date   Arthritis    Chronic kidney disease (CKD), stage III (moderate) (HCC)    COVID-19    GERD (gastroesophageal reflux disease)    occ   Hemorrhoids    History of ETT 3/05   low risk   History of hiatal hernia    History of kidney stones    HLD (hyperlipidemia)    HTN (hypertension)    Left inguinal hernia    Melanoma ([place])    excied   Sciatica    from lumbar disc  disease   Stroke [facility]) 2011   tia    TIA (transient ischemic attack) 4/11   associated w slurred speecha ndmild facial droop. lasted for about 10 minutes. Had MRI, etc with [facility] that per his report was ok (dr. Stephannie Li). Echo (4/11): EF 55-60%, no regional WMAs, normal diastolic function, mild LAE, no source of embolus    SURGICAL HISTORY: Past Surgical  History:  Procedure Laterality Date   BMI 30.2 muscular  12/08/04   BRONCHIAL BRUSHINGS  04/15/2021   Procedure: BRONCHIAL BRUSHINGS;  Surgeon: Margaretha Seeds, MD;  Location: [place];  Service: Pulmonary;;   BRONCHIAL NEEDLE ASPIRATION BIOPSY  04/15/2021   Procedure: BRONCHIAL NEEDLE ASPIRATION BIOPSIES;  Surgeon: Margaretha Seeds, MD;  Location: [facility] ENDOSCOPY;  Service: Pulmonary;;   curretage actinic keratosis R ear  01/20/05   ENDOBRONCHIAL ULTRASOUND N/A 04/15/2021   Procedure: ENDOBRONCHIAL ULTRASOUND;  Surgeon: Margaretha Seeds, MD;  Location: [place];  Service: Pulmonary;  Laterality: N/A;   ETT low prob of CAD  11/21/03   excision melanoma in situ back  01/20/05   hemorrhoids sclerosed at colonoscopy  12/19/05   INGUINAL HERNIA REPAIR Right 11/20/2021   Procedure: LAPAROSCOPIC RIGHT  INGUINAL HERNIA REPAIR WITH MESH;  Surgeon: Stechschulte, Nickola Major, MD;  Location: [place];  Service: General;  Laterality: Right;   IR IMAGING GUIDED PORT INSERTION  05/19/2020   IR REMOVAL TUN ACCESS W/ PORT W/O FL MOD SED  02/02/2022   LAPAROSCOPIC INGUINAL HERNIA REPAIR Left 12/20/1995   LUMBAR DISC SURGERY     NERVE REPAIR     back of neck   retinal laser surgery Left 09/20/1986   SEPTOPLASTY  11/19/98   TOTAL KNEE ARTHROPLASTY Left 12/13/2016   Procedure: TOTAL KNEE ARTHROPLASTY WITH RIGHT KNEE CORTISONE INJECTION;  Surgeon: Frederik Pear, MD;  Location: [place];  Service: Orthopedics;  Laterality: Left;   uric acid 6.9  12/22/04   x-ray l great toe  07/21/00    SOCIAL HISTORY: Social History   Socioeconomic History   Marital status: Married    Spouse name: [name]   Number of children: 2   Years of education: 12   Highest education level: Not on file  Occupational History   Occupation: Systems developer: [employer]  Tobacco Use   Smoking status: Never   Smokeless tobacco: Never  Vaping Use   Vaping Use: Never used  Substance and Sexual Activity   Alcohol  use: No   Drug use: No   Sexual activity: Yes  Other Topics Concern   Not on file  Social History Narrative   Married to [employer] at [employer].   Son, [name] married   Son, [name], Married.    Caffeine use: 1 cup coffee per day   Social Determinants of Health   Financial Resource Strain: Not on file  Food Insecurity: Not on file  Transportation Needs: Not on file  Physical Activity: Not on file  Stress: Not on file  Social Connections: Not on file  Intimate Partner Violence: Not on file    FAMILY HISTORY: Family History  Problem Relation Age of Onset   Liver cancer Sister    Heart failure Brother 48       CABGx4   Heart attack Mother 46   Uterine cancer Mother    Heart attack Father 79   Asthma Son    Diabetes Brother    Prostate cancer  Brother     ALLERGIES:  has No Known Allergies.  MEDICATIONS:  Current Outpatient Medications  Medication Sig Dispense Refill   acetaminophen (TYLENOL) 325 MG tablet Take 2 tablets (650 mg total) by mouth every 6 (six) hours as needed for mild pain, moderate pain or fever.     albuterol (VENTOLIN HFA) 108 (90 Base) MCG/ACT inhaler Inhale 1-2 puffs into the lungs every 6 (six) hours as needed for wheezing or shortness of breath.     amLODipine (NORVASC) 5 MG tablet Take 5 mg by mouth in the morning.     aspirin EC 81 MG tablet Take 81 mg by mouth in the morning. Swallow whole.     fluticasone (FLONASE) 50 MCG/ACT nasal spray Place 1-2 sprays into both nostrils daily as needed for allergies or rhinitis.  2   indomethacin (INDOCIN) 50 MG capsule Take 50 mg by mouth 3 (three) times daily as needed (GOUT).     loratadine (CLARITIN) 10 MG tablet Take by mouth daily as needed for allergies.     Multiple Vitamin (MULTIVITAMIN WITH MINERALS) TABS tablet Take 1 tablet by mouth in the morning.     Omega-3 Fatty Acids (FISH OIL) 600 MG CAPS Take 600 mg by mouth in the morning.     polyvinyl alcohol (LIQUIFILM TEARS) 1.4 %  ophthalmic solution Place 1 drop into both eyes in the morning.     rosuvastatin (CRESTOR) 5 MG tablet Take 5 mg by mouth in the morning.     No current facility-administered medications for this visit.    REVIEW OF SYSTEMS:   Constitutional: ( - ) fevers, ( - )  chills , ( - ) night sweats Eyes: ( - ) blurriness of vision, ( - ) double vision, ( - ) watery eyes Ears, nose, mouth, throat, and face: ( - ) mucositis, ( - ) sore throat Respiratory: ( - ) cough, ( - ) dyspnea, ( - ) wheezes Cardiovascular: ( - ) palpitation, ( - ) chest discomfort, ( - ) lower extremity swelling Gastrointestinal:  ( - ) nausea, ( - ) heartburn, ( - ) change in bowel habits Skin: ( - ) abnormal skin rashes Lymphatics: ( - ) new lymphadenopathy, ( - ) easy bruising Neurological: ( - ) numbness, ( - ) tingling, ( - ) new weaknesses Behavioral/Psych: ( - ) mood change, ( - ) new changes  All other systems were reviewed with the patient and are negative.  PHYSICAL EXAMINATION: ECOG PERFORMANCE STATUS: 1 - Symptomatic but completely ambulatory  Vitals:   08/02/22 1018  BP: (!) 195/81  Pulse: (!) 52  Resp: 17  Temp: 97.6 F (36.4 C)  SpO2: 96%    Filed Weights   08/02/22 1018  Weight: 191 lb 9.6 oz (86.9 kg)    GENERAL: well appearing elderly Caucasian male. alert, no distress and comfortable SKIN: skin color, texture, turgor are normal, no rashes or significant lesions.  EYES: conjunctiva are pink and non-injected, sclera clear LUNGS: clear to auscultation and percussion with normal breathing effort HEART: regular rate & rhythm and no murmurs and trace lower extremity edema Musculoskeletal: no cyanosis of digits and no clubbing  PSYCH: alert & oriented x 3, fluent speech NEURO: no focal motor/sensory deficits  LABORATORY DATA:  I have reviewed the data as listed    Latest Ref Rng & Units 08/02/2022   10:09 AM 04/28/2022   10:28 AM 01/26/2022    9:26 AM  CBC  WBC 4.0 - 10.5  K/uL 5.6  6.0  6.7    Hemoglobin 13.0 - 17.0 g/dL 15.4  14.0  14.0   Hematocrit 39.0 - 52.0 % 45.9  42.6  42.2   Platelets 150 - 400 K/uL 159  176  172        Latest Ref Rng & Units 08/02/2022   10:09 AM 04/28/2022   10:28 AM 01/26/2022    9:26 AM  CMP  Glucose 70 - 99 mg/dL 99  105  97   BUN 8 - 23 mg/dL 21  21  <5   Creatinine 0.61 - 1.24 mg/dL 1.35  1.29  <0.30   Sodium 135 - 145 mmol/L 142  141  143   Potassium 3.5 - 5.1 mmol/L 4.1  4.3  4.3   Chloride 98 - 111 mmol/L 106  105  106   CO2 22 - 32 mmol/L '30  31  27   '$ Calcium 8.9 - 10.3 mg/dL 9.5  9.2  9.5   Total Protein 6.5 - 8.1 g/dL 7.5  7.4  7.4   Total Bilirubin 0.3 - 1.2 mg/dL 0.6  0.5  0.6   Alkaline Phos 38 - 126 U/L 50  59  62   AST 15 - 41 U/L '23  25  28   '$ ALT 0 - 44 U/L '21  26  20     '$ RADIOGRAPHIC STUDIES: I have personally reviewed the radiological images as listed and agreed with the findings in the report: CT scan 01/15/2022 showed no evidence of residual/recurrent disease.   CT CHEST ABDOMEN PELVIS W CONTRAST  Result Date: 07/28/2022 CLINICAL DATA:  Diffuse large B-cell lymphoma; * Tracking Code: BO * EXAM: CT CHEST, ABDOMEN, AND PELVIS WITH CONTRAST TECHNIQUE: Multidetector CT imaging of the chest, abdomen and pelvis was performed following the standard protocol during bolus administration of intravenous contrast. RADIATION DOSE REDUCTION: This exam was performed according to the departmental dose-optimization program which includes automated exposure control, adjustment of the mA and/or kV according to patient size and/or use of iterative reconstruction technique. CONTRAST:  165m OMNIPAQUE IOHEXOL 300 MG/ML  SOLN COMPARISON:  CT chest, abdomen and pelvis dated January 15, 2022 FINDINGS: CT CHEST FINDINGS Cardiovascular: Normal heart size. Trace pericardial effusion. Normal caliber thoracic aorta with mild atherosclerotic disease. Mediastinum/Nodes: Esophagus and thyroid are unremarkable. No pathologically enlarged lymph nodes seen in the  chest. Lungs/Pleura: Central airways are patent. No consolidation, pleural effusion or pneumothorax. Bilateral solid pulmonary nodules, unchanged when compared with the prior exam. Reference nodule of the right lower lobe measuring 1.4 x 1.0 cm on series 4, image 130, unchanged. Musculoskeletal: Old left-sided rib fractures. No chest wall mass or suspicious bone lesions identified. CT ABDOMEN PELVIS FINDINGS Hepatobiliary: No suspicious liver lesion. Gallstones with no gallbladder wall thickening. No biliary ductal dilation. Pancreas: Unremarkable. No pancreatic ductal dilatation or surrounding inflammatory changes. Spleen: Normal in size without focal abnormality. Adrenals/Urinary Tract: Bilateral adrenal glands are unremarkable. No hydronephrosis. Previously described calculus of the distal right ureter is no longer present. Nonobstructing bilateral renal stones. Mildly hyperattenuating exophytic lesion of the left kidney measuring 1.3 cm on series 2, image 74, unchanged when compared with exams and likely a proteinaceous cyst. Mild thickening of the anterior bladder wall, unchanged when compared with the prior exam and likely related to prior hernia repair. Stomach/Bowel: Stomach is within normal limits. Appendix appears normal. No evidence of bowel wall thickening, distention, or inflammatory changes. Vascular/Lymphatic: Aortic atherosclerosis. No enlarged abdominal or pelvic lymph nodes. Reproductive: Unchanged prostatomegaly.  Other: Focal mesenteric soft tissue stranding of the upper abdomen located on series 2, image 71, unchanged when compared with prior exam and likely treated lymphoma. Postsurgical changes of prior right inguinal hernia repair. Musculoskeletal: Anterolisthesis of L4 on L5 with chronic bilateral pars defects, unchanged when compared with prior no aggressive appearing osseous lesions IMPRESSION: 1. No enlarged lymph nodes seen in the chest, abdomen or pelvis. 2. Stable bilateral solid  pulmonary nodules. 3. Focal mesenteric soft tissue stranding of the upper abdomen, unchanged when compared with prior exam and likely treated lymphoma. 4. Aortic atherosclerosis. Electronically Signed   By: Yetta Glassman M.D.   On: 07/28/2022 13:56    ASSESSMENT & PLAN TRAYE BATES 75 y.o. male with medical history significant for Stage I DLBCL who presents for a follow up visit.  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  At this time he has completed all planned chemotherapy treatment (R-EPOCH x 6 cycles).   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  --findings are consistent with a double hit DLBCL Treatment of choice is R-EPOCH chemotherapy in the inpatient setting. --he is currently s/p Cycle 6 administered on 09/08/2020 --patient has a port in place. TTE was performed and showed strong baseline heart function. --PET CT scan on 04/16/2021 showed concern for progressive disease in the chest. This was also seen on most recent scan from 07/22/2021. Prior biopsy showed granulomatous tissue with no evidence of lymphoma. At this time suspect the FDG avid areas are inflammatory and not lymphomatous.  --biopsy via bronchoscopy shows no clear evidence of recurrent disease.  Findings are most consistent with granulomatous, inflammatory tissue. --case previously discussed with Dr. Cassell [facility] of [facility]. She recommendations if recurrence is found that we consider initiation of rituximab/polatuzumab. She will schedule an appointment to see if CAR-T therapy may be an option. Plan:  --No signs or symptoms concerning for recurrence today. -- routine CBC, CMP and LDH at each clinic visit. Labs today show WBC  5.6, hemoglobin 15.4, MCV 96.8, and platelets 159 --last CT scan on 07/26/2022 showed no evidence of residual/recurrent disease. Plan for yearly scans moving forward. Next due late Nov 2024.  -- Return to clinic in 6 months   #Lung Nodules, stable -- noted on interim PET CT scan after Cycle 4.  --unclear if these represent areas of infection, metastatic disease, or progression of lymphoma --pulmonology consulted, appreciate their input.  --will monitor respiratory status closely  # Port in Place -port removed 02/02/2022  No orders of the defined types were placed in this encounter.   All questions were answered. The patient knows to call the clinic with any problems, questions or concerns.  A total of more than 30 minutes were spent on this encounter and over half of that time was spent on counseling and coordination of care as outlined above.   Connor Peoples, MD Department of Hematology/Oncology [place] [facility] Phone: [phone] Pager: [phone] Email: [email or web address]  08/02/2022 11:26 AM

## 2022-08-10 ENCOUNTER — Other Ambulatory Visit: Payer: Self-pay | Admitting: Hematology and Oncology

## 2022-08-10 MED ORDER — HYDROCORTISONE 0.5 % EX CREA: 1.0000 | TOPICAL_CREAM | Freq: Two times a day (BID) | CUTANEOUS | 0 refills | Status: AC

## 2022-10-11 DIAGNOSIS — C44519 Basal cell carcinoma of skin of other part of trunk: Secondary | ICD-10-CM | POA: Diagnosis not present

## 2022-10-11 DIAGNOSIS — C44529 Squamous cell carcinoma of skin of other part of trunk: Secondary | ICD-10-CM | POA: Diagnosis not present

## 2022-10-11 DIAGNOSIS — D225 Melanocytic nevi of trunk: Secondary | ICD-10-CM | POA: Diagnosis not present

## 2022-10-11 DIAGNOSIS — X32XXXD Exposure to sunlight, subsequent encounter: Secondary | ICD-10-CM | POA: Diagnosis not present

## 2022-10-11 DIAGNOSIS — L570 Actinic keratosis: Secondary | ICD-10-CM | POA: Diagnosis not present

## 2022-10-15 ENCOUNTER — Ambulatory Visit
Admission: EM | Admit: 2022-10-15 | Discharge: 2022-10-15 | Disposition: A | Discharge status: Home / Self Care | Payer: Medicare PPO

## 2022-10-16 ENCOUNTER — Ambulatory Visit: Payer: Medicare PPO

## 2022-10-19 DIAGNOSIS — R0981 Nasal congestion: Secondary | ICD-10-CM | POA: Diagnosis not present

## 2022-10-19 DIAGNOSIS — I10 Essential (primary) hypertension: Secondary | ICD-10-CM | POA: Diagnosis not present

## 2022-10-19 DIAGNOSIS — R109 Unspecified abdominal pain: Secondary | ICD-10-CM | POA: Diagnosis not present

## 2022-11-15 NOTE — Progress Notes (Unsigned)
Cardiology Office Note:    Date:  11/23/2022   ID:  Connor Adams, Connor Adams 01/25/1946, MRN [id number]  PCP:  Shirline Frees, MD   [facility] Providers Cardiologist:  None {  Referring MD: Shirline Frees, MD    History of Present Illness:    Connor Adams is a 77 y.o. male with a hx of CKD IIIA, Non-Hodgkins Lymphoma s/p chemotherapy, GERD, HTN, HLD, and prior TIA who was referred by Dr. Kenton Adams for palpitations.  Patient seen by PCP on 10/27/22. Notes reviewed. Reported episodes of palpitations prompting referral to cardiology.  Today, the patient states that several months ago, he was having significant sinus symptoms. He was prescribed a z-pack and started taking sudafed and he began developing palpitations. He stopped using the sudafed, he started the flonase and his symptoms resolved. Since making that change, he feels very well.  Otherwise, the patient feels very well. He is active and exercises 6 days/week. No chest pain, SOB, LE edema, orthopnea, or PND.  BP elevated 140-150 at home on average.  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 lasted for about 10 minutes. Had MRI, etc with [facility] that per his report was ok (dr. Stephannie Adams). Echo (4/11): EF 55-60%, no regional WMAs, normal diastolic function, mild LAE, no source of embolus    Past Surgical History:  Procedure Laterality Date   BMI 30.2 muscular  12/08/04   BRONCHIAL BRUSHINGS  04/15/2021    Procedure: BRONCHIAL BRUSHINGS;  Surgeon: Margaretha Seeds, MD;  Location: [place];  Service: Pulmonary;;   BRONCHIAL NEEDLE ASPIRATION BIOPSY  04/15/2021   Procedure: BRONCHIAL NEEDLE ASPIRATION BIOPSIES;  Surgeon: Margaretha Seeds, MD;  Location: [facility] ENDOSCOPY;  Service: Pulmonary;;   curretage actinic keratosis R ear  01/20/05   ENDOBRONCHIAL ULTRASOUND N/A 04/15/2021   Procedure: ENDOBRONCHIAL ULTRASOUND;  Surgeon: Margaretha Seeds, MD;  Location: [place];  Service: Pulmonary;  Laterality: N/A;   ETT low prob of CAD  11/21/03   excision melanoma in situ back  01/20/05   hemorrhoids sclerosed at colonoscopy  12/19/05   INGUINAL HERNIA REPAIR Right 11/20/2021   Procedure: LAPAROSCOPIC RIGHT  INGUINAL HERNIA REPAIR WITH MESH;  Surgeon: Stechschulte, Nickola Major, MD;  Location: [place];  Service: General;  Laterality: Right;   IR IMAGING GUIDED PORT INSERTION  05/19/2020   IR REMOVAL TUN ACCESS W/ PORT W/O FL MOD SED  02/02/2022   LAPAROSCOPIC INGUINAL HERNIA REPAIR Left 12/20/1995   LUMBAR DISC SURGERY     NERVE REPAIR     back of neck   retinal laser surgery Left 09/20/1986   SEPTOPLASTY  11/19/98   TOTAL KNEE ARTHROPLASTY Left 12/13/2016   Procedure: TOTAL KNEE ARTHROPLASTY WITH RIGHT KNEE CORTISONE INJECTION;  Surgeon: Frederik Pear, MD;  Location: [place];  Service: Orthopedics;  Laterality: Left;   uric acid 6.9  12/22/04   x-ray l great toe  07/21/00    Current Medications: Current Meds  Medication Sig   acetaminophen (TYLENOL) 325 MG tablet Take 2 tablets (650 mg total) by mouth every 6 (six) hours as needed for mild pain, moderate pain or fever.   albuterol (VENTOLIN HFA) 108 (90 Base) MCG/ACT inhaler Inhale 1-2 puffs into the lungs every 6 (six) hours as needed for wheezing or shortness of breath.   amLODipine-olmesartan (AZOR) 5-20 MG tablet Take 1 tablet by mouth daily.   aspirin EC 81 MG tablet Take 81 mg by mouth in the morning. Swallow whole.   fluticasone (FLONASE)  50 MCG/ACT nasal spray Place 1-2 sprays into both nostrils daily as needed for allergies or rhinitis.   hydrocortisone cream 0.5 % Apply 1 Application topically 2 (two) times daily.   indomethacin (INDOCIN) 50 MG capsule Take 50 mg by mouth 3 (three) times daily as needed (GOUT).   loratadine (CLARITIN) 10 MG tablet Take by mouth daily as needed for allergies.   Multiple Vitamin (MULTIVITAMIN WITH MINERALS) TABS tablet Take 1 tablet by mouth in the morning.   Omega-3 Fatty Acids (FISH OIL) 600 MG CAPS Take 600 mg by mouth in the morning.   polyvinyl alcohol (LIQUIFILM TEARS) 1.4 % ophthalmic solution Place 1 drop into both eyes in the morning.   rosuvastatin (CRESTOR) 5 MG tablet Take 5 mg by mouth in the morning.   [DISCONTINUED] amLODipine (NORVASC) 5 MG tablet Take 5 mg by mouth in the morning.     Allergies:   Patient has no known allergies.   Social History   Socioeconomic History   Marital status: Married    Spouse name: [name]   Number of children: 2   Years of education: 12   Highest education level: Not on file  Occupational History   Occupation: Systems developer: [employer]  Tobacco Use   Smoking status: Never   Smokeless tobacco: Never  Vaping Use   Vaping Use: Never used  Substance and Sexual Activity   Alcohol use: No   Drug use: No   Sexual activity: Yes  Other Topics Concern   Not on file  Social History Narrative   Married to [employer] at [employer].   Son, Connor Adams married   Son, Connor Adams, Married.    Caffeine use: 1 cup coffee per day   Social Determinants of Health   Financial Resource Strain: Not on file  Food Insecurity: Not on file  Transportation Needs: Not on file  Physical Activity: Not on file  Stress: Not on file  Social Connections: Not on file     Family History: The patient's family history includes Asthma in his son; Diabetes in his brother; Heart attack (age of onset: 38) in his father; Heart attack  (age of onset: 44) in his mother; Heart failure (age of onset: 70) in his brother; Liver cancer in his sister; Prostate cancer in his brother; Uterine cancer in his mother.  ROS:   Please see the history of present illness.     All other systems reviewed and are negative.  EKGs/Labs/Other Studies Reviewed:    The following studies were reviewed today: TTE March 27, 2021: IMPRESSIONS     1. Left ventricular ejection fraction, by estimation, is 55 to 60%. The  left ventricle has normal function. The left ventricle has no regional  wall motion abnormalities. Left ventricular diastolic  parameters are  consistent with Grade I diastolic  dysfunction (impaired relaxation). The average left ventricular global  longitudinal strain is -18.0 %. The global longitudinal strain is normal.   2. Right ventricular systolic function is normal. The right ventricular  size is normal. Tricuspid regurgitation signal is inadequate for assessing  PA pressure.   3. The mitral valve is normal in structure. Trivial mitral valve  regurgitation. No evidence of mitral stenosis.   4. The aortic valve is tricuspid. Aortic valve regurgitation is not  visualized. No aortic stenosis is present.   5. The inferior vena cava is normal in size with greater than 50%  respiratory variability, suggesting right atrial pressure of 3 mmHg.   6. Cannot exclude small PFO with left to right shunt.   Comparison(s): A prior study was performed on 10/17/20. No significant  change from prior study. Prior images reviewed side by side.   EKG:  EKG is  ordered today.  The ekg ordered today demonstrates SB with HR 57  Recent Labs: 08/02/2022: ALT 21; BUN 21; Creatinine 1.35; Hemoglobin 15.4; Platelet Count 159; Potassium 4.1; Sodium 142  Recent Lipid Panel    Component Value Date/Time   CHOL  12/31/2009 1545    160        ATP III CLASSIFICATION:  <200     mg/dL   Desirable  200-239  mg/dL   Borderline High  >=240    mg/dL   High           TRIG 213 (H) 12/31/2009 1545   HDL 28 (L) 12/31/2009 1545   CHOLHDL 5.7 12/31/2009 1545   VLDL 43 (H) 12/31/2009 1545   LDLCALC  12/31/2009 1545    89        Total Cholesterol/HDL:CHD Risk Coronary Heart Disease Risk Table                     Men   Women  1/2 Average Risk   3.4   3.3  Average Risk       5.0   4.4  2 X Average Risk   9.6   7.1  3 X Average Risk  23.4   11.0        Use the calculated Patient Ratio above and the CHD Risk Table to determine the patient's CHD Risk.        ATP III CLASSIFICATION (LDL):  <100     mg/dL   Optimal  100-129  mg/dL   Near or Above                    Optimal  130-159  mg/dL   Borderline  160-189  mg/dL   High  >190     mg/dL   Very High     Risk Assessment/Calculations:          Physical Exam:    VS:  BP (!) 188/81   Pulse (!) 57   Ht '5\' 11"'$  (1.803 m)   Wt 194 lb 12.8 oz (88.4 kg)   SpO2 96%   BMI 27.17 kg/m     Wt Readings from Last 3 Encounters:  11/23/22 194 lb 12.8 oz (88.4 kg)  08/02/22 191 lb 9.6 oz (86.9 kg)  04/28/22 188 lb 4.8 oz (85.4 kg)     GEN:  Well nourished, well developed in no acute distress HEENT: Normal NECK: No JVD; No carotid bruits CARDIAC: Bradycardic, regular, no murmurs, rubs, gallops RESPIRATORY:  Clear to auscultation  without rales, wheezing or rhonchi  ABDOMEN: Soft, non-tender, non-distended MUSCULOSKELETAL:  No edema; No deformity  SKIN: Warm and dry NEUROLOGIC:  Alert and oriented x 3 PSYCHIATRIC:  Normal affect   ASSESSMENT:    1. HYPERTENSION, BENIGN SYSTEMIC   2. Medication management   3. Palpitations   4. Mixed hyperlipidemia   5. Stage 3a chronic kidney disease (HCC)    PLAN:    In order of problems listed above:  #Palpitations: Resolved with stopping his sudafed. Currently feeling very well  #HLD: -Continue crestor '5mg'$  daily -Monitored by PCP  #HTN: BP elevated mainly 140-150s at home.  -Change amlodipine to amlodipine-olmesartan 5-'20mg'$  daily -BMET next  week  #CKD IIIA: -BMET next week -Adjusting BP meds as above  #History of Non Hodgkins Lymphoma: S/p Chemo. In remission.  -Follow-up with Onc as scheduled           Medication Adjustments/Labs and Tests Ordered: Current medicines are reviewed at length with the patient today.  Concerns regarding medicines are outlined above.  Orders Placed This Encounter  Procedures   Basic metabolic panel   EKG [id number]   Meds ordered this encounter  Medications   amLODipine-olmesartan (AZOR) 5-20 MG tablet    Sig: Take 1 tablet by mouth daily.    Dispense:  90 tablet    Refill:  3    Discontinued regular amlodipine and started on this.    Patient Instructions  Medication Instructions:   STOP TAKING AMLODIPINE NOW  START TAKING AMLODIPINE-OLMESARTAN 5 MG/20 MG DOSE--TAKE ONE TABLET BY  MOUTH DAILY  *If you need a refill on your cardiac medications before your next appointment, please call your pharmacy*   Lab Work:  [place] OFFICE--BMET  If you have labs (blood work) drawn today and your tests are completely normal, you will receive your results only by: [place] (if you have MyChart) OR A paper copy in the mail If you have any lab test that is abnormal or we need to change your treatment, we will call you to review the results.    Follow-Up: At [facility], you and your health needs are our priority.  As part of our continuing mission to provide you with exceptional heart care, we have created designated Provider Care Teams.  These Care Teams include your primary Cardiologist (physician) and Advanced Practice Providers (APPs -  Physician Assistants and Nurse Practitioners) who all work together to provide you with the care you need, when you need it.  We recommend signing up for the patient portal called "MyChart".  Sign up information is provided on this After Visit Summary.  MyChart is used to connect with patients for Virtual Visits  (Telemedicine).  Patients are able to view lab/test results, encounter notes, upcoming appointments, etc.  Non-urgent messages can be sent to your provider as well.   To learn more about what you can do with MyChart, go to [email or web address].    Your next appointment:   1 year(s)  Provider:   DR. Johney Frame     Signed, Freada Bergeron, MD  11/23/2022 11:09 AM    [place]

## 2022-11-16 ENCOUNTER — Telehealth: Payer: Self-pay | Admitting: Hematology and Oncology

## 2022-11-16 NOTE — Telephone Encounter (Signed)
Called patient per provider PAL to reschedule 5/13 appointment. Left voicemail with new appointment information and contact details if needing to reschedule.

## 2022-11-23 ENCOUNTER — Encounter: Payer: Self-pay | Admitting: Cardiology

## 2022-11-23 ENCOUNTER — Ambulatory Visit: Payer: Medicare PPO | Attending: Cardiology | Admitting: Cardiology

## 2022-11-23 VITALS — BP 188/81 | HR 57 | Ht 71.0 in | Wt 194.8 lb

## 2022-11-23 DIAGNOSIS — I10 Essential (primary) hypertension: Secondary | ICD-10-CM

## 2022-11-23 DIAGNOSIS — Z79899 Other long term (current) drug therapy: Secondary | ICD-10-CM | POA: Diagnosis not present

## 2022-11-23 DIAGNOSIS — E782 Mixed hyperlipidemia: Secondary | ICD-10-CM

## 2022-11-23 DIAGNOSIS — N1831 Chronic kidney disease, stage 3a: Secondary | ICD-10-CM

## 2022-11-23 DIAGNOSIS — R002 Palpitations: Secondary | ICD-10-CM | POA: Diagnosis not present

## 2022-11-23 MED ORDER — AMLODIPINE-OLMESARTAN 5-20 MG PO TABS: 1.0000 | ORAL_TABLET | Freq: Every day | ORAL | 3 refills | Status: DC

## 2022-11-23 NOTE — Patient Instructions (Signed)
Medication Instructions:   STOP TAKING AMLODIPINE NOW  START TAKING AMLODIPINE-OLMESARTAN 5 MG/20 MG DOSE--TAKE ONE TABLET BY  MOUTH DAILY  *If you need a refill on your cardiac medications before your next appointment, please call your pharmacy*   Lab Work:  [place] OFFICE--BMET  If you have labs (blood work) drawn today and your tests are completely normal, you will receive your results only by: [place] (if you have MyChart) OR A paper copy in the mail If you have any lab test that is abnormal or we need to change your treatment, we will call you to review the results.    Follow-Up: At [facility], you and your health needs are our priority.  As part of our continuing mission to provide you with exceptional heart care, we have created designated Provider Care Teams.  These Care Teams include your primary Cardiologist (physician) and Advanced Practice Providers (APPs -  Physician Assistants and Nurse Practitioners) who all work together to provide you with the care you need, when you need it.  We recommend signing up for the patient portal called "MyChart".  Sign up information is provided on this After Visit Summary.  MyChart is used to connect with patients for Virtual Visits (Telemedicine).  Patients are able to view lab/test results, encounter notes, upcoming appointments, etc.  Non-urgent messages can be sent to your provider as well.   To learn more about what you can do with MyChart, go to [email or web address].    Your next appointment:   1 year(s)  Provider:   DR. Johney Frame

## 2022-11-25 ENCOUNTER — Telehealth: Payer: Self-pay | Admitting: Cardiology

## 2022-11-25 NOTE — Telephone Encounter (Signed)
Left message to call the clinic.

## 2022-11-25 NOTE — Telephone Encounter (Signed)
Patient is returning call.  °

## 2022-11-25 NOTE — Telephone Encounter (Signed)
Pt c/o medication issue:  1. Name of Medication:   amLODipine-olmesartan (AZOR) 5-20 MG tablet   2. How are you currently taking this medication (dosage and times per day)?   Not taking as yet  3. Are you having a reaction (difficulty breathing--STAT)?  N/A  4. What is your medication issue?   Wife called to follow-up on patient's approval from the insurance company to get this medication.

## 2022-11-26 NOTE — Telephone Encounter (Signed)
**Note De-Identified [name] Obfuscation** Connor Adams (Key: [id number]) PA Case ID #: [id number] Outcome Approved today Coverage Starts on: 09/20/2022 12:00:00 AM, Coverage Ends on: 09/20/2023 12:00:00 AM.  Authorization Expiration Date: 09/19/2023 Drug amLODIPine-Olmesartan 5-'20MG'$  tablets ePA cloud logo Form [employer] Electronic PA Form  I have notified [place], [place] (Ph: [phone]) and the pts wife and DPR, [name] of this approval.

## 2022-11-30 ENCOUNTER — Ambulatory Visit: Payer: Medicare PPO | Attending: Cardiology

## 2022-11-30 DIAGNOSIS — I10 Essential (primary) hypertension: Secondary | ICD-10-CM | POA: Diagnosis not present

## 2022-11-30 DIAGNOSIS — Z79899 Other long term (current) drug therapy: Secondary | ICD-10-CM | POA: Diagnosis not present

## 2022-11-30 LAB — BASIC METABOLIC PANEL
BUN/Creatinine Ratio: 17 (ref 10–24)
BUN: 23 mg/dL (ref 8–27)
CO2: 25 mmol/L (ref 20–29)
Calcium: 9.6 mg/dL (ref 8.6–10.2)
Chloride: 104 mmol/L (ref 96–106)
Creatinine, Ser: 1.38 mg/dL — ABNORMAL HIGH (ref 0.76–1.27)
Glucose: 101 mg/dL — ABNORMAL HIGH (ref 70–99)
Potassium: 4.6 mmol/L (ref 3.5–5.2)
Sodium: 142 mmol/L (ref 134–144)
eGFR: 53 mL/min/{1.73_m2} — ABNORMAL LOW (ref >59)

## 2022-12-02 DIAGNOSIS — Z08 Encounter for follow-up examination after completed treatment for malignant neoplasm: Secondary | ICD-10-CM | POA: Diagnosis not present

## 2022-12-02 DIAGNOSIS — Z85828 Personal history of other malignant neoplasm of skin: Secondary | ICD-10-CM | POA: Diagnosis not present

## 2022-12-02 DIAGNOSIS — C44519 Basal cell carcinoma of skin of other part of trunk: Secondary | ICD-10-CM | POA: Diagnosis not present

## 2022-12-02 DIAGNOSIS — C44319 Basal cell carcinoma of skin of other parts of face: Secondary | ICD-10-CM | POA: Diagnosis not present

## 2023-01-05 ENCOUNTER — Other Ambulatory Visit: Payer: Self-pay | Admitting: Hematology and Oncology

## 2023-01-05 DIAGNOSIS — C8333 Diffuse large B-cell lymphoma, intra-abdominal lymph nodes: Secondary | ICD-10-CM

## 2023-01-05 NOTE — Progress Notes (Unsigned)
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   1) 04/17/2020:  CT A/P showed dominant nodal mass (11.5 x 10.9 cm) within the jejunal mesentery, highly suspicious for lymphoma. Additionally there are left lower lobe pleural-based pulmonary nodules 2) 04/24/2020: establish care with Dr. Leonides Schanz 3) 04/28/2020:  PET CT scan shows Large solid left mesenteric mass 13.0 cm with maximum SUV of 21.2, Deauville 5 with local hypermetabolic porta hepatis and retroperitoneal lymph nodes. Constitutes bulky Stage I disease.  4) 05/27/2020-05/31/2020: Cycle 1 of R-EPOCH 5) 06/16/2020-06/23/2020: Cycle 2 of R-EPOCH 6) 07/07/2020-07/11/2020: Cycle 3 of R-EPOCH  7) 07/28/2020-08/01/2020: Cycle 4 of R-EPOCH 8) 08/18/2020-08/22/2020: Cycle 5 of R-EPOCH 9) 09/08/2020-09/12/2020: Cycle 6 of R-EPOCH 10) 10/06/2020: PET CT scan shows decreased size of dominant mesenteric mass, however there was hypermetabolic activity within a single left anterior mesenteric lymph node  11) 12/31/2020: PET CT scan showed hypermetabolic nodular thickening the medial aspect of the RIGHT lower lobe is concerning for neoplasm versus atypical inflammatory response 12) 03/27/2021: PET CT Scan showed progression of disease compared with 12/30/2020. Multiple mediastinal and hilar lymph nodes exhibit new FDG uptake compatible with Deauville criteria 5. 13) 04/15/2021: bronchoscopy performed with biopsy of mediastinal lymph nodes.  Findings consistent with granulomatous disease.  No evidence of recurrence. 14) 07/22/2021: PET CT scan showed mediastinal, hilar, abdominal and inguinal lymph nodes, compatible with the provided history of lymphoma (overall Deauville 4). 15) 10/21/2021: CT scan shows no clear evidence of recurrence of lymphoma. Stable  previously note lymph nodes and lung nodules.   Interval History:  Connor Adams 77 y.o. male with medical history significant for Stage I DLBCL who presents for a follow up visit. The patient was last seen on 08/02/2022 .  In the interim since his last visit he has had no major changes in his health.   On exam today Connor Adams is unaccompanied.  He reports he has been feeling good.  He reports he may be overworked outside in the yard and got dehydrated on Tuesday.  He also recently started a new blood pressure medication Azor.  He reports that he urinates about 6-8 times per day and normally it is clear but when he sees it is yellowing up he tends to drink more fluid.  He also reports he has been eating well.  He has a injury on his left arm from where he fell and scraped, but reports that it is healing quite well.  Overall he is at his baseline level of health.  Otherwise he has no questions, concerns or complaints.  He currently denies having issues with fevers, chills, sweats, nausea, vomiting or diarrhea.  A full 10 point ROS is listed below.  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 lasted for about 10 minutes. Had MRI, etc with [facility] that per his report was ok (dr. Merceda Elks). Echo (4/11): EF 55-60%, no regional WMAs, normal diastolic function, mild LAE, no source of embolus    SURGICAL HISTORY: Past Surgical History:  Procedure Laterality Date   BMI 30.2  muscular  12/08/04   BRONCHIAL BRUSHINGS  04/15/2021   Procedure: BRONCHIAL BRUSHINGS;  Surgeon: Luciano Cutter, MD;  Location: [facility] ENDOSCOPY;  Service: Pulmonary;;   BRONCHIAL NEEDLE ASPIRATION BIOPSY  04/15/2021   Procedure: BRONCHIAL NEEDLE ASPIRATION BIOPSIES;  Surgeon: Luciano Cutter, MD;  Location: [facility] ENDOSCOPY;  Service: Pulmonary;;   curretage actinic keratosis R ear  01/20/05   ENDOBRONCHIAL ULTRASOUND N/A 04/15/2021   Procedure: ENDOBRONCHIAL ULTRASOUND;  Surgeon: Luciano Cutter, MD;  Location: [facility] ENDOSCOPY;  Service: Pulmonary;  Laterality: N/A;   ETT low prob of CAD  11/21/03   excision melanoma in situ back  01/20/05   hemorrhoids sclerosed at colonoscopy  12/19/05   INGUINAL HERNIA REPAIR Right 11/20/2021   Procedure: LAPAROSCOPIC RIGHT  INGUINAL HERNIA REPAIR WITH MESH;  Surgeon: Stechschulte, Hyman Hopes, MD;  Location: [facility];  Service: General;  Laterality: Right;   IR IMAGING GUIDED PORT INSERTION  05/19/2020   IR REMOVAL TUN ACCESS W/ PORT W/O FL MOD SED  02/02/2022   LAPAROSCOPIC INGUINAL HERNIA REPAIR Left 12/20/1995   LUMBAR DISC SURGERY     NERVE REPAIR     back of neck   retinal laser surgery Left 09/20/1986   SEPTOPLASTY  11/19/98   TOTAL KNEE ARTHROPLASTY Left 12/13/2016   Procedure: TOTAL KNEE ARTHROPLASTY WITH RIGHT KNEE CORTISONE INJECTION;  Surgeon: Gean Birchwood, MD;  Location: [facility] OR;  Service: Orthopedics;  Laterality: Left;   uric acid 6.9  12/22/04   x-ray l great toe  07/21/00    SOCIAL HISTORY: Social History   Socioeconomic History   Marital status: Married    Spouse name: [name]   Number of children: 2   Years of education: 12   Highest education level: Not on file  Occupational History   Occupation: Development worker, community: [employer]  Tobacco Use   Smoking status: Never   Smokeless tobacco: Never  Vaping Use   Vaping Use: Never used  Substance and Sexual Activity   Alcohol use: No   Drug use: No   Sexual activity: Yes   Other Topics Concern   Not on file  Social History Narrative   Married to [employer] at [employer]   Son, Jena Gauss married   Son, Casimiro Needle, Married.    Caffeine use: 1 cup coffee per day   Social Determinants of Health   Financial Resource Strain: Not on file  Food Insecurity: Not on file  Transportation Needs: Not on file  Physical Activity: Not on file  Stress: Not on file  Social Connections: Not on file  Intimate Partner Violence: Not on file    FAMILY HISTORY: Family History  Problem Relation Age of Onset   Liver cancer Sister    Heart failure Brother 4       CABGx4   Heart attack Mother 92   Uterine cancer Mother    Heart attack Father 53   Asthma Son    Diabetes Brother    Prostate cancer Brother  ALLERGIES:  has No Known Allergies.  MEDICATIONS:  Current Outpatient Medications  Medication Sig Dispense Refill   potassium chloride SA (KLOR-CON M) 20 MEQ tablet Take 1 tablet (20 mEq total) by mouth daily. 14 tablet 1   acetaminophen (TYLENOL) 325 MG tablet Take 2 tablets (650 mg total) by mouth every 6 (six) hours as needed for mild pain, moderate pain or fever.     albuterol (VENTOLIN HFA) 108 (90 Base) MCG/ACT inhaler Inhale 1-2 puffs into the lungs every 6 (six) hours as needed for wheezing or shortness of breath.     amLODipine-olmesartan (AZOR) 5-20 MG tablet Take 1 tablet by mouth daily. 90 tablet 3   aspirin EC 81 MG tablet Take 81 mg by mouth in the morning. Swallow whole.     fluticasone (FLONASE) 50 MCG/ACT nasal spray Place 1-2 sprays into both nostrils daily as needed for allergies or rhinitis.  2   hydrocortisone cream 0.5 % Apply 1 Application topically 2 (two) times daily. 30 g 0   indomethacin (INDOCIN) 50 MG capsule Take 50 mg by mouth 3 (three) times daily as needed (GOUT).     loratadine (CLARITIN) 10 MG tablet Take by mouth daily as needed for allergies.     Multiple Vitamin (MULTIVITAMIN WITH MINERALS) TABS tablet Take 1  tablet by mouth in the morning.     Omega-3 Fatty Acids (FISH OIL) 600 MG CAPS Take 600 mg by mouth in the morning.     polyvinyl alcohol (LIQUIFILM TEARS) 1.4 % ophthalmic solution Place 1 drop into both eyes in the morning.     rosuvastatin (CRESTOR) 5 MG tablet Take 5 mg by mouth in the morning.     No current facility-administered medications for this visit.    REVIEW OF SYSTEMS:   Constitutional: ( - ) fevers, ( - )  chills , ( - ) night sweats Eyes: ( - ) blurriness of vision, ( - ) double vision, ( - ) watery eyes Ears, nose, mouth, throat, and face: ( - ) mucositis, ( - ) sore throat Respiratory: ( - ) cough, ( - ) dyspnea, ( - ) wheezes Cardiovascular: ( - ) palpitation, ( - ) chest discomfort, ( - ) lower extremity swelling Gastrointestinal:  ( - ) nausea, ( - ) heartburn, ( - ) change in bowel habits Skin: ( - ) abnormal skin rashes Lymphatics: ( - ) new lymphadenopathy, ( - ) easy bruising Neurological: ( - ) numbness, ( - ) tingling, ( - ) new weaknesses Behavioral/Psych: ( - ) mood change, ( - ) new changes  All other systems were reviewed with the patient and are negative.  PHYSICAL EXAMINATION: ECOG PERFORMANCE STATUS: 1 - Symptomatic but completely ambulatory  Vitals:   01/06/23 1503  BP: (!) 148/73  Pulse: (!) 58  Resp: 14  Temp: 97.7 F (36.5 C)  SpO2: 94%     Filed Weights   01/06/23 1503  Weight: 191 lb 8 oz (86.9 kg)     GENERAL: well appearing elderly Caucasian male. alert, no distress and comfortable SKIN: skin color, texture, turgor are normal, no rashes or significant lesions.  EYES: conjunctiva are pink and non-injected, sclera clear LUNGS: clear to auscultation and percussion with normal breathing effort HEART: regular rate & rhythm and no murmurs and trace lower extremity edema Musculoskeletal: no cyanosis of digits and no clubbing  PSYCH: alert & oriented x 3, fluent speech NEURO: no focal motor/sensory deficits  LABORATORY DATA:  I  have  reviewed the data as listed    Latest Ref Rng & Units 01/06/2023    2:03 PM 08/02/2022   10:09 AM 04/28/2022   10:28 AM  CBC  WBC 4.0 - 10.5 K/uL 6.5  5.6  6.0   Hemoglobin 13.0 - 17.0 g/dL 16.1  09.6  04.5   Hematocrit 39.0 - 52.0 % 40.9  45.9  42.6   Platelets 150 - 400 K/uL 152  159  176        Latest Ref Rng & Units 01/06/2023    2:03 PM 11/30/2022    9:12 AM 08/02/2022   10:09 AM  CMP  Glucose 70 - 99 mg/dL 409  811  99   BUN 8 - 23 mg/dL Creatinine 0.61 - 1.24 mg/dL 9.14  7.82  9.56   Sodium 135 - 145 mmol/L 143  142  142   Potassium 3.5 - 5.1 mmol/L 3.1  4.6  4.1   Chloride 98 - 111 mmol/L 109  104  106   CO2 22 - 32 mmol/L Calcium 8.9 - 10.3 mg/dL 9.8  9.6  9.5   Total Protein 6.5 - 8.1 g/dL 7.2   7.5   Total Bilirubin 0.3 - 1.2 mg/dL 0.5   0.6   Alkaline Phos 38 - 126 U/L 53   50   AST 15 - 41 U/L 27   23   ALT 0 - 44 U/L 25   21     RADIOGRAPHIC STUDIES: I have personally reviewed the radiological images as listed and agreed with the findings in the report: CT scan 01/15/2022 showed no evidence of residual/recurrent disease.   No results found.  ASSESSMENT & PLAN Connor Adams 77 y.o. male with medical history significant for Stage I DLBCL who presents for a follow up visit.  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  At this time he has completed all planned chemotherapy treatment (R-EPOCH x 6 cycles).   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  --findings are consistent with a double hit DLBCL Treatment of choice is R-EPOCH chemotherapy in the inpatient setting. --he is currently s/p Cycle 6 administered on  09/08/2020 --patient has a port in place. TTE was performed and showed strong baseline heart function. --PET CT scan on 04/16/2021 showed concern for progressive disease in the chest. This was also seen on most recent scan from 07/22/2021. Prior biopsy showed granulomatous tissue with no evidence of lymphoma. At this time suspect the FDG avid areas are inflammatory and not lymphomatous.  --biopsy via bronchoscopy shows no clear evidence of recurrent disease.  Findings are most consistent with granulomatous, inflammatory tissue. --case previously discussed with Dr. Alphonsa Gin of [facility]. She recommendations if recurrence is found that we consider initiation of rituximab/polatuzumab. She will schedule an appointment to see if CAR-T therapy may be an option. Plan:  --No signs or symptoms concerning for recurrence today. -- routine CBC, CMP and LDH at each clinic visit. Labs today show WBC 6.5, hemoglobin 13.8, MCV 95.6, and platelets of 152 --last CT scan on 07/26/2022 showed no evidence of residual/recurrent disease. Plan for yearly scans moving forward.  Next due late Nov 2024.  -- Return to clinic in 6 months   #Lung Nodules, stable -- noted on interim PET CT scan after Cycle 4.  --unclear if these represent areas of infection, metastatic disease, or progression of lymphoma --pulmonology consulted, appreciate their input.  --will monitor respiratory status closely  # Port in Place -port removed 02/02/2022  No orders of the defined types were placed in this encounter.   All questions were answered. The patient knows to call the clinic with any problems, questions or concerns.  A total of more than 30 minutes were spent on this encounter and over half of that time was spent on counseling and coordination of care as outlined above.   Ulysees Barns, MD Department of Hematology/Oncology [facility] Phone: [phone] Pager: [phone] Email:  [email or web address]  01/06/2023 4:43 PM

## 2023-01-06 ENCOUNTER — Other Ambulatory Visit: Payer: Self-pay

## 2023-01-06 ENCOUNTER — Inpatient Hospital Stay: Payer: Medicare PPO | Attending: Nurse Practitioner

## 2023-01-06 ENCOUNTER — Inpatient Hospital Stay: Payer: Medicare PPO | Admitting: Hematology and Oncology

## 2023-01-06 ENCOUNTER — Other Ambulatory Visit: Payer: Self-pay | Admitting: *Deleted

## 2023-01-06 VITALS — BP 148/73 | HR 58 | Temp 97.7°F | Resp 14 | Wt 191.5 lb

## 2023-01-06 DIAGNOSIS — Z85820 Personal history of malignant melanoma of skin: Secondary | ICD-10-CM | POA: Insufficient documentation

## 2023-01-06 DIAGNOSIS — N183 Chronic kidney disease, stage 3 unspecified: Secondary | ICD-10-CM | POA: Diagnosis not present

## 2023-01-06 DIAGNOSIS — K219 Gastro-esophageal reflux disease without esophagitis: Secondary | ICD-10-CM | POA: Insufficient documentation

## 2023-01-06 DIAGNOSIS — Z79899 Other long term (current) drug therapy: Secondary | ICD-10-CM | POA: Diagnosis not present

## 2023-01-06 DIAGNOSIS — E785 Hyperlipidemia, unspecified: Secondary | ICD-10-CM | POA: Diagnosis not present

## 2023-01-06 DIAGNOSIS — C8333 Diffuse large B-cell lymphoma, intra-abdominal lymph nodes: Secondary | ICD-10-CM

## 2023-01-06 DIAGNOSIS — I129 Hypertensive chronic kidney disease with stage 1 through stage 4 chronic kidney disease, or unspecified chronic kidney disease: Secondary | ICD-10-CM | POA: Diagnosis not present

## 2023-01-06 DIAGNOSIS — C8330 Diffuse large B-cell lymphoma, unspecified site: Secondary | ICD-10-CM | POA: Diagnosis not present

## 2023-01-06 DIAGNOSIS — R918 Other nonspecific abnormal finding of lung field: Secondary | ICD-10-CM | POA: Diagnosis not present

## 2023-01-06 LAB — CBC WITH DIFFERENTIAL (CANCER CENTER ONLY)
Abs Immature Granulocytes: 0.01 10*3/uL (ref 0.00–0.07)
Basophils Absolute: 0 10*3/uL (ref 0.0–0.1)
Basophils Relative: 1 %
Eosinophils Absolute: 0.1 10*3/uL (ref 0.0–0.5)
Eosinophils Relative: 2 %
HCT: 40.9 % (ref 39.0–52.0)
Hemoglobin: 13.8 g/dL (ref 13.0–17.0)
Immature Granulocytes: 0 %
Lymphocytes Relative: 33 %
Lymphs Abs: 2.1 10*3/uL (ref 0.7–4.0)
MCH: 32.2 pg (ref 26.0–34.0)
MCHC: 33.7 g/dL (ref 30.0–36.0)
MCV: 95.6 fL (ref 80.0–100.0)
Monocytes Absolute: 0.4 10*3/uL (ref 0.1–1.0)
Monocytes Relative: 6 %
Neutro Abs: 3.8 10*3/uL (ref 1.7–7.7)
Neutrophils Relative %: 58 %
Platelet Count: 152 10*3/uL (ref 150–400)
RBC: 4.28 MIL/uL (ref 4.22–5.81)
RDW: 13.2 % (ref 11.5–15.5)
WBC Count: 6.5 10*3/uL (ref 4.0–10.5)
nRBC: 0 % (ref 0.0–0.2)

## 2023-01-06 LAB — CMP (CANCER CENTER ONLY)
ALT: 25 U/L (ref 0–44)
AST: 27 U/L (ref 15–41)
Albumin: 4.5 g/dL (ref 3.5–5.0)
Alkaline Phosphatase: 53 U/L (ref 38–126)
Anion gap: 6 (ref 5–15)
BUN: 30 mg/dL — ABNORMAL HIGH (ref 8–23)
CO2: 28 mmol/L (ref 22–32)
Calcium: 9.8 mg/dL (ref 8.9–10.3)
Chloride: 109 mmol/L (ref 98–111)
Creatinine: 1.6 mg/dL — ABNORMAL HIGH (ref 0.61–1.24)
GFR, Estimated: 44 mL/min — ABNORMAL LOW (ref >60)
Glucose, Bld: 115 mg/dL — ABNORMAL HIGH (ref 70–99)
Potassium: 3.1 mmol/L — ABNORMAL LOW (ref 3.5–5.1)
Sodium: 143 mmol/L (ref 135–145)
Total Bilirubin: 0.5 mg/dL (ref 0.3–1.2)
Total Protein: 7.2 g/dL (ref 6.5–8.1)

## 2023-01-06 LAB — LACTATE DEHYDROGENASE: LDH: 159 U/L (ref 98–192)

## 2023-01-06 MED ORDER — POTASSIUM CHLORIDE CRYS ER 20 MEQ PO TBCR: 20.0000 meq | EXTENDED_RELEASE_TABLET | Freq: Every day | ORAL | 1 refills | Status: DC

## 2023-01-07 ENCOUNTER — Telehealth: Payer: Self-pay | Admitting: Hematology and Oncology

## 2023-01-07 ENCOUNTER — Telehealth: Payer: Self-pay | Admitting: *Deleted

## 2023-01-07 DIAGNOSIS — N1831 Chronic kidney disease, stage 3a: Secondary | ICD-10-CM

## 2023-01-07 DIAGNOSIS — Z79899 Other long term (current) drug therapy: Secondary | ICD-10-CM

## 2023-01-07 DIAGNOSIS — E876 Hypokalemia: Secondary | ICD-10-CM

## 2023-01-07 NOTE — Telephone Encounter (Signed)
-----   Message from Meriam Sprague, MD sent at 01/06/2023  3:51 PM EDT ----- Thank you so much for forwarding the labs!  The mild bump in his Cr is within expected range with addition or ARB.  Not sure why his K dropped. Lajoyce Corners, do you mind checking if he has been having diarrhea or vomiting? His BUN suggests he is a bit dry.   We can repeat his BMET next week to make sure things are better.  ----- Message ----- From: Awilda Metro, RPH-CPP Sent: 01/06/2023   3:24 PM EDT To: Meriam Sprague, MD  Weird his K went down so much. Yep, it's about a 14% increase in SCr. Ok to continue, could recheck again in another few weeks to make sure it stays stable ----- Message ----- From: Meriam Sprague, MD Sent: 01/06/2023   3:22 PM EDT To: Emeline Darling Supple, RPH-CPP  I added olmesartan. You think this mild bump is okay? ----- Message ----- From: Kyra Searles, RN Sent: 01/06/2023   3:05 PM EDT To: Meriam Sprague, MD  Please note change in pt's BUN/Creat and potassium. These are new since his last med change with you.  Binnie Rail, RN with Dr. Jeanie Sewer

## 2023-01-07 NOTE — Telephone Encounter (Signed)
Pt states his Cancer Doctor started him on potassium supplementation and he will start taking today.  He states he has had no diarrhea or vomiting, but was dehydrated a week ago and now has started drinking more water.  Made Dr. Shari Prows aware of all of this.  Per Dr. Shari Prows, she wants him to proceed with taking the potassium pill his Cancer Doctor called in for him, and she would like for him to come back into the office next week to have a BMET rechecked, to reassess his renal function and electrolytes.   Scheduled the pt for repeat BMET on next Thursday 01/13/23.    Pt states he will start his potassium pill today.  Pt verbalized understanding and agrees with this plan.

## 2023-01-07 NOTE — Telephone Encounter (Signed)
Message Received: Toni Amend, Kathlynn Grate, MD  Jaci Standard, MD; Loa Socks, LPN Thank you so much for forwarding the labs!  The mild bump in his Cr is within expected range with addition or ARB.  Not sure why his K dropped. Lajoyce Corners, do you mind checking if he has been having diarrhea or vomiting? His BUN suggests he is a bit dry.  We can repeat his BMET next week to make sure things are better.       Previous Messages    ----- Message ----- From: Awilda Metro, RPH-CPP Sent: 01/06/2023   3:24 PM EDT To: Meriam Sprague, MD  Weird his K went down so much. Yep, it's about a 14% increase in SCr. Ok to continue, could recheck again in another few weeks to make sure it stays stable ----- Message ----- From: Meriam Sprague, MD Sent: 01/06/2023   3:22 PM EDT To: Emeline Darling Supple, RPH-CPP  I added olmesartan. You think this mild bump is okay? ----- Message ----- From: Kyra Searles, RN Sent: 01/06/2023   3:05 PM EDT To: Meriam Sprague, MD  Please note change in pt's BUN/Creat and potassium. These are new since his last med change with you.  Binnie Rail, RN with Dr. Jeanie Sewer

## 2023-01-07 NOTE — Telephone Encounter (Signed)
Reached out to patient to schedule per 4/18 LOS, left voicemail

## 2023-01-13 ENCOUNTER — Ambulatory Visit: Payer: Medicare PPO

## 2023-01-13 DIAGNOSIS — Z08 Encounter for follow-up examination after completed treatment for malignant neoplasm: Secondary | ICD-10-CM | POA: Diagnosis not present

## 2023-01-13 DIAGNOSIS — E876 Hypokalemia: Secondary | ICD-10-CM | POA: Diagnosis not present

## 2023-01-13 DIAGNOSIS — N1831 Chronic kidney disease, stage 3a: Secondary | ICD-10-CM | POA: Diagnosis not present

## 2023-01-13 DIAGNOSIS — C44519 Basal cell carcinoma of skin of other part of trunk: Secondary | ICD-10-CM | POA: Diagnosis not present

## 2023-01-13 DIAGNOSIS — Z85828 Personal history of other malignant neoplasm of skin: Secondary | ICD-10-CM | POA: Diagnosis not present

## 2023-01-13 DIAGNOSIS — Z79899 Other long term (current) drug therapy: Secondary | ICD-10-CM | POA: Diagnosis not present

## 2023-01-13 DIAGNOSIS — C44222 Squamous cell carcinoma of skin of right ear and external auricular canal: Secondary | ICD-10-CM | POA: Diagnosis not present

## 2023-01-14 LAB — BASIC METABOLIC PANEL
BUN/Creatinine Ratio: 19 (ref 10–24)
BUN: 26 mg/dL (ref 8–27)
CO2: 22 mmol/L (ref 20–29)
Calcium: 9.4 mg/dL (ref 8.6–10.2)
Chloride: 104 mmol/L (ref 96–106)
Creatinine, Ser: 1.4 mg/dL — ABNORMAL HIGH (ref 0.76–1.27)
Glucose: 101 mg/dL — ABNORMAL HIGH (ref 70–99)
Potassium: 4.9 mmol/L (ref 3.5–5.2)
Sodium: 144 mmol/L (ref 134–144)
eGFR: 52 mL/min/{1.73_m2} — ABNORMAL LOW (ref >59)

## 2023-01-31 ENCOUNTER — Other Ambulatory Visit: Payer: Medicare PPO

## 2023-01-31 ENCOUNTER — Ambulatory Visit: Payer: Medicare PPO | Admitting: Hematology and Oncology

## 2023-02-24 DIAGNOSIS — Z08 Encounter for follow-up examination after completed treatment for malignant neoplasm: Secondary | ICD-10-CM | POA: Diagnosis not present

## 2023-02-24 DIAGNOSIS — C44519 Basal cell carcinoma of skin of other part of trunk: Secondary | ICD-10-CM | POA: Diagnosis not present

## 2023-02-24 DIAGNOSIS — Z85828 Personal history of other malignant neoplasm of skin: Secondary | ICD-10-CM | POA: Diagnosis not present

## 2023-02-24 DIAGNOSIS — L570 Actinic keratosis: Secondary | ICD-10-CM | POA: Diagnosis not present

## 2023-02-24 DIAGNOSIS — X32XXXD Exposure to sunlight, subsequent encounter: Secondary | ICD-10-CM | POA: Diagnosis not present

## 2023-03-11 ENCOUNTER — Ambulatory Visit: Payer: Medicare PPO | Admitting: Gastroenterology

## 2023-03-11 ENCOUNTER — Encounter: Payer: Self-pay | Admitting: Gastroenterology

## 2023-03-11 VITALS — BP 124/60 | HR 68 | Ht 68.0 in | Wt 193.1 lb

## 2023-03-11 DIAGNOSIS — L290 Pruritus ani: Secondary | ICD-10-CM

## 2023-03-11 MED ORDER — HYDROCORTISONE (PERIANAL) 2.5 % EX CREA: 1.0000 | TOPICAL_CREAM | Freq: Every evening | CUTANEOUS | 1 refills | Status: AC

## 2023-03-11 NOTE — Progress Notes (Signed)
03/11/2023 Connor Adams [id number] [birth year]   HISTORY OF PRESENT ILLNESS: This is a 77 year old male who is a patient of Dr. Elana Alm.  He is here today with complaints of hemorrhoids.  His last colonoscopy was in 2017 with Dr. Kinnie Scales.  He had a repeat recommended at 10-year interval, 2027.  He had hemorrhoid bandings in the past with Dr. Kinnie Scales about 2018.  He is here today with complaints of perianal itching and burning as well as some sensation of swelling.  Has gotten better over the last couple days.  Has had intermittent bright red rectal bleeding a couple of times in the past, but nothing within the last couple of months.  No abdominal pain.  Is moving his bowels well.  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 lasted for about 10 minutes. Had MRI, etc with [facility] that per his report was ok (dr. Merceda Elks). Echo (4/11): EF 55-60%, no regional WMAs, normal diastolic function, mild LAE, no source of embolus   Past Surgical History:  Procedure Laterality Date   BMI 30.2 muscular  12/08/04   BRONCHIAL BRUSHINGS  04/15/2021   Procedure: BRONCHIAL BRUSHINGS;  Surgeon: Luciano Cutter, MD;  Location: [facility] ENDOSCOPY;  Service: Pulmonary;;   BRONCHIAL NEEDLE ASPIRATION BIOPSY  04/15/2021   Procedure: BRONCHIAL NEEDLE ASPIRATION BIOPSIES;  Surgeon: Luciano Cutter, MD;  Location: [facility] ENDOSCOPY;  Service: Pulmonary;;   curretage actinic keratosis R ear  01/20/05   ENDOBRONCHIAL  ULTRASOUND N/A 04/15/2021   Procedure: ENDOBRONCHIAL ULTRASOUND;  Surgeon: Luciano Cutter, MD;  Location: [facility] ENDOSCOPY;  Service: Pulmonary;  Laterality: N/A;   ETT low prob of CAD  11/21/03   excision melanoma in situ back  01/20/05   hemorrhoids sclerosed at colonoscopy  12/19/05   INGUINAL HERNIA REPAIR Right 11/20/2021   Procedure: LAPAROSCOPIC RIGHT  INGUINAL HERNIA REPAIR WITH MESH;  Surgeon: Stechschulte, Hyman Hopes, MD;  Location: [facility];  Service: General;  Laterality: Right;   IR IMAGING GUIDED PORT INSERTION  05/19/2020   IR REMOVAL TUN ACCESS W/ PORT W/O FL MOD SED  02/02/2022   LAPAROSCOPIC INGUINAL HERNIA REPAIR Left 12/20/1995   LUMBAR DISC SURGERY     NERVE REPAIR     back of neck   retinal laser surgery Left 09/20/1986   SEPTOPLASTY  11/19/98   TOTAL KNEE ARTHROPLASTY Left 12/13/2016   Procedure: TOTAL KNEE ARTHROPLASTY WITH RIGHT KNEE CORTISONE INJECTION;  Surgeon: Gean Birchwood, MD;  Location: [facility] OR;  Service: Orthopedics;  Laterality: Left;   uric acid 6.9  12/22/04   x-ray l great toe  07/21/00    reports that he has never smoked. He has never used smokeless tobacco. He reports that he does not drink alcohol and does not use drugs. family history includes Asthma in his son; Diabetes  in his brother; Heart attack (age of onset: 52) in his father; Heart attack (age of onset: 47) in his mother; Heart failure (age of onset: 35) in his brother; Liver cancer in his sister; Prostate cancer in his brother; Uterine cancer in his mother. No Active Allergies    Outpatient Encounter Medications as of 03/11/2023  Medication Sig   acetaminophen (TYLENOL) 325 MG tablet Take 2 tablets (650 mg total) by mouth every 6 (six) hours as needed for mild pain, moderate pain or fever.   albuterol (VENTOLIN HFA) 108 (90 Base) MCG/ACT inhaler Inhale 1-2 puffs into the lungs every 6 (six) hours as needed for wheezing or shortness of breath.   amLODipine-olmesartan (AZOR) 5-20 MG tablet Take 1  tablet by mouth daily.   aspirin EC 81 MG tablet Take 81 mg by mouth in the morning. Swallow whole.   fluticasone (FLONASE) 50 MCG/ACT nasal spray Place 1-2 sprays into both nostrils daily as needed for allergies or rhinitis.   hydrocortisone cream 0.5 % Apply 1 Application topically 2 (two) times daily.   indomethacin (INDOCIN) 50 MG capsule Take 50 mg by mouth 3 (three) times daily as needed (GOUT).   loratadine (CLARITIN) 10 MG tablet Take by mouth daily as needed for allergies.   Multiple Vitamin (MULTIVITAMIN WITH MINERALS) TABS tablet Take 1 tablet by mouth in the morning.   Omega-3 Fatty Acids (FISH OIL) 600 MG CAPS Take 600 mg by mouth in the morning.   polyvinyl alcohol (LIQUIFILM TEARS) 1.4 % ophthalmic solution Place 1 drop into both eyes in the morning.   rosuvastatin (CRESTOR) 5 MG tablet Take 5 mg by mouth in the morning.   potassium chloride SA (KLOR-CON M) 20 MEQ tablet Take 1 tablet (20 mEq total) by mouth daily. (Patient not taking: Reported on 03/11/2023)   No facility-administered encounter medications on file as of 03/11/2023.    REVIEW OF SYSTEMS  : All other systems reviewed and negative except where noted in the History of Present Illness.   PHYSICAL EXAM: BP 124/60 (BP Location: Left Arm, Patient Position: Sitting, Cuff Size: Normal)   Pulse 68 Comment: irregular  Ht 5\' 8"  (1.727 m)   Wt 193 lb 2 oz (87.6 kg)   BMI 29.36 kg/m  General: Well developed white male in no acute distress Head: Normocephalic and atraumatic Eyes:  Sclerae anicteric, conjunctiva pink. Ears: Normal auditory acuity Rectal: Very small external hemorrhoids noted.  Irritated perianal tissue noted. Musculoskeletal: Symmetrical with no gross deformities  Extremities: No edema  Neurological: Alert oriented x 4, grossly non-focal Psychological:  Alert and cooperative. Normal mood and affect  ASSESSMENT AND PLAN: *Perianal irritation: I do not think that his external hemorrhoids are the big  issue.  He does have skin irritation.  I am going to increase his hydrocortisone dose from the 0.5% that was prescribed to 2.5% for him to use.  New prescription sent to pharmacy.  He can use this on Preparation H suppositories to use internally to target internal hemorrhoids at bedtime for a week or so.  Recommended Desitin alternating with some hydrocortisone on the perianal area.  Advised to have good hygiene, but avoid over aggressive hygiene and wiping.  CC:  Noberto Retort, MD

## 2023-03-11 NOTE — Patient Instructions (Signed)
We have sent the following medications to your pharmacy for you to pick up at your convenience: Hydrocortisone 2.5 % cream - Cover Preparation H suppository nightly for 7 days.  Continue to use [place] to the perianal area.  _______________________________________________________  If your blood pressure at your visit was 140/90 or greater, please contact your primary care physician to follow up on this.  _______________________________________________________  If you are age 77 or older, your body mass index should be between 23-30. Your Body mass index is 29.36 kg/m. If this is out of the aforementioned range listed, please consider follow up with your Primary Care Provider.  If you are age 27 or younger, your body mass index should be between 19-25. Your Body mass index is 29.36 kg/m. If this is out of the aformentioned range listed, please consider follow up with your Primary Care Provider.   ________________________________________________________  The [facility] providers would like to encourage you to use [facility] to communicate with providers for non-urgent requests or questions.  Due to long hold times on the telephone, sending your provider a message by [facility] may be a faster and more efficient way to get a response.  Please allow 48 business hours for a response.  Please remember that this is for non-urgent requests.  _______________________________________________________

## 2023-03-29 NOTE — Progress Notes (Signed)
Reviewed and agree with documentation and assessment and plan. K. Veena Judson Tsan , MD   

## 2023-04-07 DIAGNOSIS — L570 Actinic keratosis: Secondary | ICD-10-CM | POA: Diagnosis not present

## 2023-04-07 DIAGNOSIS — X32XXXD Exposure to sunlight, subsequent encounter: Secondary | ICD-10-CM | POA: Diagnosis not present

## 2023-04-07 DIAGNOSIS — C44319 Basal cell carcinoma of skin of other parts of face: Secondary | ICD-10-CM | POA: Diagnosis not present

## 2023-04-29 DIAGNOSIS — I700 Atherosclerosis of aorta: Secondary | ICD-10-CM | POA: Diagnosis not present

## 2023-04-29 DIAGNOSIS — E7800 Pure hypercholesterolemia, unspecified: Secondary | ICD-10-CM | POA: Diagnosis not present

## 2023-04-29 DIAGNOSIS — I10 Essential (primary) hypertension: Secondary | ICD-10-CM | POA: Diagnosis not present

## 2023-04-29 DIAGNOSIS — N183 Chronic kidney disease, stage 3 unspecified: Secondary | ICD-10-CM | POA: Diagnosis not present

## 2023-04-29 DIAGNOSIS — Z0000 Encounter for general adult medical examination without abnormal findings: Secondary | ICD-10-CM | POA: Diagnosis not present

## 2023-04-29 DIAGNOSIS — Z8579 Personal history of other malignant neoplasms of lymphoid, hematopoietic and related tissues: Secondary | ICD-10-CM | POA: Diagnosis not present

## 2023-04-29 DIAGNOSIS — Z9181 History of falling: Secondary | ICD-10-CM | POA: Diagnosis not present

## 2023-04-29 DIAGNOSIS — Z23 Encounter for immunization: Secondary | ICD-10-CM | POA: Diagnosis not present

## 2023-04-29 DIAGNOSIS — R7303 Prediabetes: Secondary | ICD-10-CM | POA: Diagnosis not present

## 2023-05-19 ENCOUNTER — Ambulatory Visit: Payer: Medicare PPO | Admitting: Physician Assistant

## 2023-05-19 DIAGNOSIS — Z85828 Personal history of other malignant neoplasm of skin: Secondary | ICD-10-CM | POA: Diagnosis not present

## 2023-05-19 DIAGNOSIS — L570 Actinic keratosis: Secondary | ICD-10-CM | POA: Diagnosis not present

## 2023-05-19 DIAGNOSIS — Z08 Encounter for follow-up examination after completed treatment for malignant neoplasm: Secondary | ICD-10-CM | POA: Diagnosis not present

## 2023-05-19 DIAGNOSIS — X32XXXD Exposure to sunlight, subsequent encounter: Secondary | ICD-10-CM | POA: Diagnosis not present

## 2023-07-04 ENCOUNTER — Encounter: Payer: Self-pay | Admitting: Hematology and Oncology

## 2023-07-04 NOTE — Telephone Encounter (Signed)
error 

## 2023-07-08 ENCOUNTER — Other Ambulatory Visit: Payer: Self-pay | Admitting: Hematology and Oncology

## 2023-07-08 ENCOUNTER — Inpatient Hospital Stay: Payer: Medicare PPO | Admitting: Hematology and Oncology

## 2023-07-08 ENCOUNTER — Inpatient Hospital Stay: Payer: Medicare PPO | Attending: Internal Medicine

## 2023-07-08 VITALS — BP 145/75 | HR 58 | Temp 97.8°F | Resp 16 | Wt 193.7 lb

## 2023-07-08 DIAGNOSIS — C8333 Diffuse large B-cell lymphoma, intra-abdominal lymph nodes: Secondary | ICD-10-CM

## 2023-07-08 DIAGNOSIS — C8330 Diffuse large B-cell lymphoma, unspecified site: Secondary | ICD-10-CM | POA: Diagnosis not present

## 2023-07-08 DIAGNOSIS — Z79899 Other long term (current) drug therapy: Secondary | ICD-10-CM | POA: Diagnosis not present

## 2023-07-08 DIAGNOSIS — R918 Other nonspecific abnormal finding of lung field: Secondary | ICD-10-CM | POA: Insufficient documentation

## 2023-07-08 LAB — CMP (CANCER CENTER ONLY)
ALT: 22 U/L (ref 0–44)
AST: 24 U/L (ref 15–41)
Albumin: 4.4 g/dL (ref 3.5–5.0)
Alkaline Phosphatase: 46 U/L (ref 38–126)
Anion gap: 7 (ref 5–15)
BUN: 29 mg/dL — ABNORMAL HIGH (ref 8–23)
CO2: 27 mmol/L (ref 22–32)
Calcium: 9.5 mg/dL (ref 8.9–10.3)
Chloride: 105 mmol/L (ref 98–111)
Creatinine: 1.49 mg/dL — ABNORMAL HIGH (ref 0.61–1.24)
GFR, Estimated: 48 mL/min — ABNORMAL LOW (ref >60)
Glucose, Bld: 134 mg/dL — ABNORMAL HIGH (ref 70–99)
Potassium: 4.3 mmol/L (ref 3.5–5.1)
Sodium: 139 mmol/L (ref 135–145)
Total Bilirubin: 0.5 mg/dL (ref 0.3–1.2)
Total Protein: 7.1 g/dL (ref 6.5–8.1)

## 2023-07-08 LAB — CBC WITH DIFFERENTIAL (CANCER CENTER ONLY)
Abs Immature Granulocytes: 0.02 10*3/uL (ref 0.00–0.07)
Basophils Absolute: 0 10*3/uL (ref 0.0–0.1)
Basophils Relative: 1 %
Eosinophils Absolute: 0.1 10*3/uL (ref 0.0–0.5)
Eosinophils Relative: 2 %
HCT: 38.7 % — ABNORMAL LOW (ref 39.0–52.0)
Hemoglobin: 12.9 g/dL — ABNORMAL LOW (ref 13.0–17.0)
Immature Granulocytes: 0 %
Lymphocytes Relative: 29 %
Lymphs Abs: 1.8 10*3/uL (ref 0.7–4.0)
MCH: 32.6 pg (ref 26.0–34.0)
MCHC: 33.3 g/dL (ref 30.0–36.0)
MCV: 97.7 fL (ref 80.0–100.0)
Monocytes Absolute: 0.4 10*3/uL (ref 0.1–1.0)
Monocytes Relative: 6 %
Neutro Abs: 3.7 10*3/uL (ref 1.7–7.7)
Neutrophils Relative %: 62 %
Platelet Count: 180 10*3/uL (ref 150–400)
RBC: 3.96 MIL/uL — ABNORMAL LOW (ref 4.22–5.81)
RDW: 13.1 % (ref 11.5–15.5)
WBC Count: 6 10*3/uL (ref 4.0–10.5)
nRBC: 0 % (ref 0.0–0.2)

## 2023-07-08 LAB — LACTATE DEHYDROGENASE: LDH: 151 U/L (ref 98–192)

## 2023-07-08 NOTE — Progress Notes (Unsigned)
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   1) 04/17/2020:  CT A/P showed dominant nodal mass (11.5 x 10.9 cm) within the jejunal mesentery, highly suspicious for lymphoma. Additionally there are left lower lobe pleural-based pulmonary nodules 2) 04/24/2020: establish care with Dr. Leonides Schanz 3) 04/28/2020:  PET CT scan shows Large solid left mesenteric mass 13.0 cm with maximum SUV of 21.2, Deauville 5 with local hypermetabolic porta hepatis and retroperitoneal lymph nodes. Constitutes bulky Stage I disease.  4) 05/27/2020-05/31/2020: Cycle 1 of R-EPOCH 5) 06/16/2020-06/23/2020: Cycle 2 of R-EPOCH 6) 07/07/2020-07/11/2020: Cycle 3 of R-EPOCH  7) 07/28/2020-08/01/2020: Cycle 4 of R-EPOCH 8) 08/18/2020-08/22/2020: Cycle 5 of R-EPOCH 9) 09/08/2020-09/12/2020: Cycle 6 of R-EPOCH 10) 10/06/2020: PET CT scan shows decreased size of dominant mesenteric mass, however there was hypermetabolic activity within a single left anterior mesenteric lymph node  11) 12/31/2020: PET CT scan showed hypermetabolic nodular thickening the medial aspect of the RIGHT lower lobe is concerning for neoplasm versus atypical inflammatory response 12) 03/27/2021: PET CT Scan showed progression of disease compared with 12/30/2020. Multiple mediastinal and hilar lymph nodes exhibit new FDG uptake compatible with Deauville criteria 5. 13) 04/15/2021: bronchoscopy performed with biopsy of mediastinal lymph nodes.  Findings consistent with granulomatous disease.  No evidence of recurrence. 14) 07/22/2021: PET CT scan showed mediastinal, hilar, abdominal and inguinal lymph nodes, compatible with the provided history of lymphoma (overall Deauville 4). 15) 10/21/2021: CT scan shows no clear evidence of recurrence of lymphoma.  Stable previously note lymph nodes and lung nodules.   Interval History:  IROH Connor Adams 77 y.o. male with medical history significant for Stage I DLBCL who presents for a follow up visit. The patient was last seen on 01/06/2023 .  In the interim since his last visit he has had no major changes in his health.   On exam today Connor Adams is unaccompanied.  He reports he has been doing great overall and interim since our last visit.  He reports that he does have occasional leg cramp every now and again but his energy is very strong and his appetite is good.  He is doing his best to try to stay well-hydrated and can drink up to 10 glasses of water per day.  He reports that his weight has been steady.  He reports that his wife unfortunately had all of her teeth removed and recently had implants in the bottoms.  She has not been well enough to accompany him on his visits.  He has not had any recent illnesses such as runny nose, sore throat, or cough.  He does not have any signs or symptoms concerning for recurrence of his lymphoma.  Overall he is at his baseline level of health.  Otherwise he has no questions, concerns or complaints.  He currently denies having issues with fevers, chills, sweats, nausea, vomiting or diarrhea.  A full 10 point ROS is listed below.  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 lasted for about 10 minutes. Had MRI, etc with [facility] that per his report was  ok (dr. Merceda Elks). Echo (4/11): EF 55-60%, no regional WMAs, normal diastolic function, mild LAE, no source of embolus    SURGICAL HISTORY: Past Surgical History:  Procedure Laterality Date   BMI 30.2 muscular  12/08/04   BRONCHIAL BRUSHINGS  04/15/2021   Procedure: BRONCHIAL BRUSHINGS;  Surgeon: Luciano Cutter, MD;  Location: [facility] ENDOSCOPY;  Service: Pulmonary;;   BRONCHIAL NEEDLE ASPIRATION BIOPSY  04/15/2021   Procedure: BRONCHIAL NEEDLE ASPIRATION BIOPSIES;  Surgeon: Luciano Cutter, MD;  Location: [facility] ENDOSCOPY;  Service: Pulmonary;;   curretage actinic keratosis R ear  01/20/05   ENDOBRONCHIAL ULTRASOUND N/A 04/15/2021   Procedure: ENDOBRONCHIAL ULTRASOUND;  Surgeon: Luciano Cutter, MD;  Location: [facility] ENDOSCOPY;  Service: Pulmonary;  Laterality: N/A;   ETT low prob of CAD  11/21/03   excision melanoma in situ back  01/20/05   hemorrhoids sclerosed at colonoscopy  12/19/05   INGUINAL HERNIA REPAIR Right 11/20/2021   Procedure: LAPAROSCOPIC RIGHT  INGUINAL HERNIA REPAIR WITH MESH;  Surgeon: Stechschulte, Hyman Hopes, MD;  Location: [facility];  Service: General;  Laterality: Right;   IR IMAGING GUIDED PORT INSERTION  05/19/2020   IR REMOVAL TUN ACCESS W/ PORT W/O FL MOD SED  02/02/2022   LAPAROSCOPIC INGUINAL HERNIA REPAIR Left 12/20/1995   LUMBAR DISC SURGERY     NERVE REPAIR     back of neck   retinal laser surgery Left 09/20/1986   SEPTOPLASTY  11/19/98   TOTAL KNEE ARTHROPLASTY Left 12/13/2016   Procedure: TOTAL KNEE ARTHROPLASTY WITH RIGHT KNEE CORTISONE INJECTION;  Surgeon: Gean Birchwood, MD;  Location: [facility] OR;  Service: Orthopedics;  Laterality: Left;   uric acid 6.9  12/22/04   x-ray l great toe  07/21/00    SOCIAL HISTORY: Social History   Socioeconomic History   Marital status: Married    Spouse name: [name]   Number of children: 2   Years of education: 12   Highest education level: Not on file  Occupational History   Occupation: Development worker, community: [employer]  Tobacco Use   Smoking status: Never   Smokeless tobacco: Never  Vaping Use   Vaping status: Never Used  Substance and Sexual Activity   Alcohol use: No   Drug use: No   Sexual activity: Yes  Other Topics Concern   Not on file  Social History Narrative   Married to [employer] at [employer]   Son, Jena Gauss married   Son, Casimiro Needle, Married.    Caffeine use: 1 cup coffee per day   Social Determinants of Health   Financial Resource Strain: Not on file  Food Insecurity: Not on file  Transportation Needs: Not on file  Physical Activity: Not on file  Stress: Not on file  Social Connections: Not on file  Intimate Partner Violence: Not on file    FAMILY HISTORY: Family History  Problem Relation Age of Onset   Liver cancer Sister    Heart failure Brother 25       CABGx4  Heart attack Mother 57   Uterine cancer Mother    Heart attack Father 31   Asthma Son    Diabetes Brother    Prostate cancer Brother     ALLERGIES:  has no active allergies.  MEDICATIONS:  Current Outpatient Medications  Medication Sig Dispense Refill   acetaminophen (TYLENOL) 325 MG tablet Take 2 tablets (650 mg total) by mouth every 6 (six) hours as needed for mild pain, moderate pain or fever.     albuterol (VENTOLIN HFA) 108 (90 Base) MCG/ACT inhaler Inhale 1-2 puffs into the lungs every 6 (six) hours as needed for wheezing or shortness of breath.     amLODipine-olmesartan (AZOR) 5-20 MG tablet Take 1 tablet by mouth daily. 90 tablet 3   aspirin EC 81 MG tablet Take 81 mg by mouth in the morning. Swallow whole.     fluticasone (FLONASE) 50 MCG/ACT nasal spray Place 1-2 sprays into both nostrils daily as needed for allergies or rhinitis.  2   hydrocortisone (ANUSOL-HC) 2.5 % rectal cream Place 1 Application rectally at bedtime. 30 g 1   hydrocortisone cream 0.5 % Apply 1 Application topically 2 (two) times daily. 30 g 0   indomethacin (INDOCIN) 50 MG capsule Take 50 mg by  mouth 3 (three) times daily as needed (GOUT).     loratadine (CLARITIN) 10 MG tablet Take by mouth daily as needed for allergies.     Multiple Vitamin (MULTIVITAMIN WITH MINERALS) TABS tablet Take 1 tablet by mouth in the morning.     Omega-3 Fatty Acids (FISH OIL) 600 MG CAPS Take 600 mg by mouth in the morning.     polyvinyl alcohol (LIQUIFILM TEARS) 1.4 % ophthalmic solution Place 1 drop into both eyes in the morning.     potassium chloride SA (KLOR-CON M) 20 MEQ tablet Take 1 tablet (20 mEq total) by mouth daily. (Patient not taking: Reported on 03/11/2023) 14 tablet 1   rosuvastatin (CRESTOR) 5 MG tablet Take 5 mg by mouth in the morning.     No current facility-administered medications for this visit.    REVIEW OF SYSTEMS:   Constitutional: ( - ) fevers, ( - )  chills , ( - ) night sweats Eyes: ( - ) blurriness of vision, ( - ) double vision, ( - ) watery eyes Ears, nose, mouth, throat, and face: ( - ) mucositis, ( - ) sore throat Respiratory: ( - ) cough, ( - ) dyspnea, ( - ) wheezes Cardiovascular: ( - ) palpitation, ( - ) chest discomfort, ( - ) lower extremity swelling Gastrointestinal:  ( - ) nausea, ( - ) heartburn, ( - ) change in bowel habits Skin: ( - ) abnormal skin rashes Lymphatics: ( - ) new lymphadenopathy, ( - ) easy bruising Neurological: ( - ) numbness, ( - ) tingling, ( - ) new weaknesses Behavioral/Psych: ( - ) mood change, ( - ) new changes  All other systems were reviewed with the patient and are negative.  PHYSICAL EXAMINATION: ECOG PERFORMANCE STATUS: 1 - Symptomatic but completely ambulatory  Vitals:   07/08/23 1437  BP: (!) 145/75  Pulse: (!) 58  Resp: 16  Temp: 97.8 F (36.6 C)  SpO2: 97%     Filed Weights   07/08/23 1437  Weight: 193 lb 11.2 oz (87.9 kg)     GENERAL: well appearing elderly Caucasian male. alert, no distress and comfortable SKIN: skin color, texture, turgor are normal, no rashes or significant lesions.  EYES: conjunctiva  are pink and non-injected, sclera clear LUNGS: clear to auscultation and percussion with normal breathing effort HEART: regular rate & rhythm and no murmurs and trace lower extremity edema Musculoskeletal: no cyanosis of digits and no clubbing  PSYCH: alert & oriented x 3, fluent speech NEURO: no focal motor/sensory deficits  LABORATORY DATA:  I have reviewed the data as listed    Latest Ref Rng & Units 07/08/2023    2:04 PM 01/06/2023    2:03 PM 08/02/2022   10:09 AM  CBC  WBC 4.0 - 10.5 K/uL 6.0  6.5  5.6   Hemoglobin 13.0 - 17.0 g/dL 16.1  09.6  04.5   Hematocrit 39.0 - 52.0 % 38.7  40.9  45.9   Platelets 150 - 400 K/uL 180  152  159        Latest Ref Rng & Units 07/08/2023    2:04 PM 01/13/2023   10:26 AM 01/06/2023    2:03 PM  CMP  Glucose 70 - 99 mg/dL 409  811  914   BUN 8 - 23 mg/dL 29  26  30    Creatinine 0.61 - 1.24 mg/dL 7.82  9.56  2.13   Sodium 135 - 145 mmol/L 139  144  143   Potassium 3.5 - 5.1 mmol/L 4.3  4.9  3.1   Chloride 98 - 111 mmol/L 105  104  109   CO2 22 - 32 mmol/L 27  22  28    Calcium 8.9 - 10.3 mg/dL 9.5  9.4  9.8   Total Protein 6.5 - 8.1 g/dL 7.1   7.2   Total Bilirubin 0.3 - 1.2 mg/dL 0.5   0.5   Alkaline Phos 38 - 126 U/L 46   53   AST 15 - 41 U/L 24   27   ALT 0 - 44 U/L 22   25     RADIOGRAPHIC STUDIES: I have personally reviewed the radiological images as listed and agreed with the findings in the report: CT scan 01/15/2022 showed no evidence of residual/recurrent disease.   No results found.  ASSESSMENT & PLAN Connor Adams 77 y.o. male with medical history significant for Stage I DLBCL who presents for a follow up visit.  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  At this time he has completed all planned chemotherapy treatment (R-EPOCH x  6 cycles).   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  --findings are consistent with a double hit DLBCL Treatment of choice is R-EPOCH chemotherapy in the inpatient setting. --he is currently s/p Cycle 6 administered on 09/08/2020 --patient has a port in place. TTE was performed and showed strong baseline heart function. --PET CT scan on 04/16/2021 showed concern for progressive disease in the chest. This was also seen on most recent scan from 07/22/2021. Prior biopsy showed granulomatous tissue with no evidence of lymphoma. At this time suspect the FDG avid areas are inflammatory and not lymphomatous.  --biopsy via bronchoscopy shows no clear evidence of recurrent disease.  Findings are most consistent with granulomatous, inflammatory tissue. --case previously discussed with Dr. Alphonsa Gin of [facility]. She recommendations if recurrence is found that we consider initiation of rituximab/polatuzumab. She will schedule an appointment to  see if CAR-T therapy may be an option. Plan:  --No signs or symptoms concerning for recurrence today. -- routine CBC, CMP and LDH at each clinic visit. Labs today show WBC 6.0, hemoglobin 12.9, MCV 97.7, with platelets of 180.  Creatinine 1.49 with normal LFTs --last CT scan on 07/26/2022 showed no evidence of residual/recurrent disease. Plan for yearly scans moving forward. Next due late Nov 2024.  -- Return to clinic in 6 months   #Lung Nodules, stable -- noted on interim PET CT scan after Cycle 4.  --unclear if these represent areas of infection, metastatic disease, or progression of lymphoma --pulmonology consulted, appreciate their input.  --will monitor respiratory status closely  # Port in Place -port removed 02/02/2022  Orders Placed This Encounter  Procedures   CT CHEST ABDOMEN PELVIS W CONTRAST    Standing Status:   Future    Standing Expiration  Date:   10/10/2023    Order Specific Question:   If indicated for the ordered procedure, I authorize the administration of contrast media per Radiology protocol    Answer:   Yes    Order Specific Question:   Does the patient have a contrast media/X-ray dye allergy?    Answer:   No    Order Specific Question:   Preferred imaging location?    Answer:   [facility]    Order Specific Question:   If indicated for the ordered procedure, I authorize the administration of oral contrast media per Radiology protocol    Answer:   Yes    All questions were answered. The patient knows to call the clinic with any problems, questions or concerns.  A total of more than 30 minutes were spent on this encounter and over half of that time was spent on counseling and coordination of care as outlined above.   Ulysees Barns, MD Department of Hematology/Oncology [facility] Phone: [phone] Pager: [phone] Email: [email or web address]  07/10/2023 2:10 PM

## 2023-07-10 ENCOUNTER — Encounter: Payer: Self-pay | Admitting: Hematology and Oncology

## 2023-07-28 DIAGNOSIS — M1711 Unilateral primary osteoarthritis, right knee: Secondary | ICD-10-CM | POA: Diagnosis not present

## 2023-08-04 DIAGNOSIS — M25561 Pain in right knee: Secondary | ICD-10-CM | POA: Diagnosis not present

## 2023-08-04 DIAGNOSIS — S83241A Other tear of medial meniscus, current injury, right knee, initial encounter: Secondary | ICD-10-CM | POA: Diagnosis not present

## 2023-08-11 ENCOUNTER — Encounter: Payer: Self-pay | Admitting: Hematology and Oncology

## 2023-08-11 NOTE — Telephone Encounter (Signed)
Telephone call  

## 2023-08-12 DIAGNOSIS — M25561 Pain in right knee: Secondary | ICD-10-CM | POA: Diagnosis not present

## 2023-08-25 DIAGNOSIS — S83241A Other tear of medial meniscus, current injury, right knee, initial encounter: Secondary | ICD-10-CM | POA: Diagnosis not present

## 2023-08-27 ENCOUNTER — Ambulatory Visit (HOSPITAL_COMMUNITY)
Admission: RE | Admit: 2023-08-27 | Discharge: 2023-08-27 | Disposition: A | Discharge status: Home / Self Care | Payer: Medicare PPO | Source: Ambulatory Visit | Attending: Hematology and Oncology | Admitting: Hematology and Oncology

## 2023-08-27 DIAGNOSIS — C8333 Diffuse large B-cell lymphoma, intra-abdominal lymph nodes: Secondary | ICD-10-CM | POA: Insufficient documentation

## 2023-08-27 DIAGNOSIS — S2242XA Multiple fractures of ribs, left side, initial encounter for closed fracture: Secondary | ICD-10-CM | POA: Diagnosis not present

## 2023-08-27 DIAGNOSIS — C8590 Non-Hodgkin lymphoma, unspecified, unspecified site: Secondary | ICD-10-CM | POA: Diagnosis not present

## 2023-08-27 DIAGNOSIS — K8020 Calculus of gallbladder without cholecystitis without obstruction: Secondary | ICD-10-CM | POA: Diagnosis not present

## 2023-08-27 LAB — POCT I-STAT CREATININE: Creatinine, Ser: 1.5 mg/dL — ABNORMAL HIGH (ref 0.61–1.24)

## 2023-08-27 MED ORDER — IOHEXOL 300 MG/ML  SOLN
80.0000 mL | Freq: Once | INTRAMUSCULAR | Status: AC | PRN
  Administered 2023-08-27: 80 mL via INTRAVENOUS

## 2023-08-27 MED ORDER — IOHEXOL 300 MG/ML  SOLN
30.0000 mL | Freq: Once | INTRAMUSCULAR | Status: AC | PRN
  Administered 2023-08-27: 30 mL via ORAL

## 2023-08-31 DIAGNOSIS — Z8669 Personal history of other diseases of the nervous system and sense organs: Secondary | ICD-10-CM | POA: Diagnosis not present

## 2023-08-31 DIAGNOSIS — H10011 Acute follicular conjunctivitis, right eye: Secondary | ICD-10-CM | POA: Diagnosis not present

## 2023-08-31 DIAGNOSIS — Z961 Presence of intraocular lens: Secondary | ICD-10-CM | POA: Diagnosis not present

## 2023-08-31 DIAGNOSIS — H2512 Age-related nuclear cataract, left eye: Secondary | ICD-10-CM | POA: Diagnosis not present

## 2023-09-05 DIAGNOSIS — Z85828 Personal history of other malignant neoplasm of skin: Secondary | ICD-10-CM | POA: Diagnosis not present

## 2023-09-05 DIAGNOSIS — Z08 Encounter for follow-up examination after completed treatment for malignant neoplasm: Secondary | ICD-10-CM | POA: Diagnosis not present

## 2023-09-05 DIAGNOSIS — X32XXXD Exposure to sunlight, subsequent encounter: Secondary | ICD-10-CM | POA: Diagnosis not present

## 2023-09-05 DIAGNOSIS — C4441 Basal cell carcinoma of skin of scalp and neck: Secondary | ICD-10-CM | POA: Diagnosis not present

## 2023-09-05 DIAGNOSIS — L570 Actinic keratosis: Secondary | ICD-10-CM | POA: Diagnosis not present

## 2023-09-08 DIAGNOSIS — R262 Difficulty in walking, not elsewhere classified: Secondary | ICD-10-CM | POA: Diagnosis not present

## 2023-09-08 DIAGNOSIS — M1731 Unilateral post-traumatic osteoarthritis, right knee: Secondary | ICD-10-CM | POA: Diagnosis not present

## 2023-09-08 DIAGNOSIS — M25661 Stiffness of right knee, not elsewhere classified: Secondary | ICD-10-CM | POA: Diagnosis not present

## 2023-09-16 ENCOUNTER — Telehealth: Payer: Self-pay | Admitting: *Deleted

## 2023-09-16 NOTE — Telephone Encounter (Signed)
   Name: Connor Adams  DOB: 07-Oct-1945  MRN: [id number]  Primary Cardiologist: None  Preoperative team, please contact this patient and set up a phone call appointment for further preoperative risk assessment. Please obtain consent and complete medication review. Thank you for your help.  I confirm that guidance regarding antiplatelet and oral anticoagulation therapy has been completed and, if necessary, noted below.  On chart review aspirin 81 mg daily has not been prescribed by [facility].  Patient has history of TIA, recommendations regarding holding of aspirin should come from prescribing provider or patient's PCP.  I also confirmed the patient resides in the state of [place]. As per [facility] Medical Board telemedicine laws, the patient must reside in the state in which the provider is licensed.  Rip Harbour, NP 09/16/2023, 4:36 PM [facility]

## 2023-09-16 NOTE — Telephone Encounter (Signed)
Left message to call back to schedule tele pre op appt.  

## 2023-09-16 NOTE — Telephone Encounter (Signed)
   Pre-operative Risk Assessment    Patient Name: Connor Adams  DOB: Jan 31, 1946 MRN: [id number]   Date of last office visit: 11/23/22 DR. PEMBERTON  Date of next office visit: 11/18/23 DR. TOLIA   Request for Surgical Clearance    Procedure:  RIGHT TOTAL KNEE ARTHROPLASTY  Date of Surgery:  Clearance TBD                                Surgeon:  DR. Gean Birchwood Surgeon's Group or Practice Name:  [facility] Phone number:  [phone] ATTN: REBECCA LANG Fax number:  [phone]   Type of Clearance Requested:   - Medical  - Pharmacy:  Hold Aspirin     Type of Anesthesia:  Spinal   Additional requests/questions:    Elpidio Anis   09/16/2023, 12:38 PM

## 2023-09-19 ENCOUNTER — Telehealth: Payer: Self-pay | Admitting: *Deleted

## 2023-09-19 DIAGNOSIS — M1731 Unilateral post-traumatic osteoarthritis, right knee: Secondary | ICD-10-CM | POA: Diagnosis not present

## 2023-09-19 DIAGNOSIS — M25661 Stiffness of right knee, not elsewhere classified: Secondary | ICD-10-CM | POA: Diagnosis not present

## 2023-09-19 DIAGNOSIS — R262 Difficulty in walking, not elsewhere classified: Secondary | ICD-10-CM | POA: Diagnosis not present

## 2023-09-19 NOTE — Telephone Encounter (Signed)
Recording now states vm not set up.

## 2023-09-19 NOTE — Telephone Encounter (Signed)
 Pt has been scheduled tele preop appt 10/04/23. Med rec and consent are done.

## 2023-09-19 NOTE — Telephone Encounter (Signed)
Left message on pt's wife's phone # to call back to sched tele preop appt.

## 2023-09-19 NOTE — Telephone Encounter (Signed)
Pt has been scheduled tele preop appt 10/04/23. Med rec and consent are done.     Patient Consent for Virtual Visit        Connor Adams has provided verbal consent on 09/19/2023 for a virtual visit (video or telephone).   CONSENT FOR VIRTUAL VISIT FOR:  Connor Adams  By participating in this virtual visit I agree to the following:  I hereby voluntarily request, consent and authorize [facility] and its employed or contracted physicians, physician assistants, nurse practitioners or other licensed health care professionals (the Practitioner), to provide me with telemedicine health care services (the "Services") as deemed necessary by the treating Practitioner. I acknowledge and consent to receive the Services by the Practitioner via telemedicine. I understand that the telemedicine visit will involve communicating with the Practitioner through live audiovisual communication technology and the disclosure of certain medical information by electronic transmission. I acknowledge that I have been given the opportunity to request an in-person assessment or other available alternative prior to the telemedicine visit and am voluntarily participating in the telemedicine visit.  I understand that I have the right to withhold or withdraw my consent to the use of telemedicine in the course of my care at any time, without affecting my right to future care or treatment, and that the Practitioner or I may terminate the telemedicine visit at any time. I understand that I have the right to inspect all information obtained and/or recorded in the course of the telemedicine visit and may receive copies of available information for a reasonable fee.  I understand that some of the potential risks of receiving the Services via telemedicine include:  Delay or interruption in medical evaluation due to technological equipment failure or disruption; Information transmitted may not be sufficient (e.g. poor  resolution of images) to allow for appropriate medical decision making by the Practitioner; and/or  In rare instances, security protocols could fail, causing a breach of personal health information.  Furthermore, I acknowledge that it is my responsibility to provide information about my medical history, conditions and care that is complete and accurate to the best of my ability. I acknowledge that Practitioner's advice, recommendations, and/or decision may be based on factors not within their control, such as incomplete or inaccurate data provided by me or distortions of diagnostic images or specimens that may result from electronic transmissions. I understand that the practice of medicine is not an exact science and that Practitioner makes no warranties or guarantees regarding treatment outcomes. I acknowledge that a copy of this consent can be made available to me via my patient portal [facility] MyChart), or I can request a printed copy by calling the office of [facility].    I understand that my insurance will be billed for this visit.   I have read or had this consent read to me. I understand the contents of this consent, which adequately explains the benefits and risks of the Services being provided via telemedicine.  I have been provided ample opportunity to ask questions regarding this consent and the Services and have had my questions answered to my satisfaction. I give my informed consent for the services to be provided through the use of telemedicine in my medical care

## 2023-09-26 DIAGNOSIS — M25661 Stiffness of right knee, not elsewhere classified: Secondary | ICD-10-CM | POA: Diagnosis not present

## 2023-09-26 DIAGNOSIS — M1731 Unilateral post-traumatic osteoarthritis, right knee: Secondary | ICD-10-CM | POA: Diagnosis not present

## 2023-09-26 DIAGNOSIS — R262 Difficulty in walking, not elsewhere classified: Secondary | ICD-10-CM | POA: Diagnosis not present

## 2023-10-04 ENCOUNTER — Ambulatory Visit: Payer: Medicare PPO | Attending: Cardiology

## 2023-10-04 DIAGNOSIS — Z01810 Encounter for preprocedural cardiovascular examination: Secondary | ICD-10-CM

## 2023-10-04 DIAGNOSIS — Z01818 Encounter for other preprocedural examination: Secondary | ICD-10-CM

## 2023-10-04 NOTE — Progress Notes (Signed)
 Virtual Visit via Telephone Note   Because of Connor Adams's co-morbid illnesses, he is at least at moderate risk for complications without adequate follow up.  This format is felt to be most appropriate for this patient at this time.  The patient did not have access to video technology/had technical difficulties with video requiring transitioning to audio format only (telephone).  All issues noted in this document were discussed and addressed.  No physical exam could be performed with this format.  Please refer to the patient's chart for his consent to telehealth for [facility].  Evaluation Performed:  Preoperative cardiovascular risk assessment _____________   Date:  10/04/2023   Patient ID:  Connor Adams, DOB 12/11/1945, MRN [id number] Patient Location:  Home Provider location:   Office  Primary Care Provider:  Arloa Elsie SAUNDERS, MD Primary Cardiologist:  Formerly Dr. Hobart (Will be established with Dr. Michele on 11/18/2023 follow-up)  Chief Complaint / Patient Profile   78 y.o. y/o male with a h/o chronic kidney disease 3A, non-Hodgkin's lymphoma, status postchemotherapy, GERD, hypertension, hyperlipidemia, palpitations, and TIA.  Normal echocardiogram on 03/09/2021 concerning LV systolic function of 55 to 60%.  Grade 1 diastolic dysfunction.    He is pending right total knee arthroplasty by Dr. Dempsey Sensor on date to be determined with questions concerning holding aspirin  perioperatively.  And presents today for telephonic preoperative cardiovascular risk assessment.  History of Present Illness    Connor Adams is a 78 y.o. male who presents via audio/video conferencing for a telehealth visit today.  Pt was last seen in cardiology clinic on 11/23/2018 for by Dr. Hobart (Will be established with Dr. Michele on 11/18/2023).  At that time ALVIA TORY was doing fair. Palpitations were found to be related to using Sudafed over-the-counter.  Blood pressures were  not well-controlled and therefore amlodipine  was changed to amlodipine  olmesartan  5/20 mg daily. .  The patient is now pending procedure as outlined above. Since his last visit, he has done very well, he walks 3 miles 3 times a week, lifts weights, is very active around his home.  He is medically compliant, he keeps up with his blood pressure and it has been very well-controlled.  He offers no cardiac complaints.  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 lasted for about 10 minutes. Had MRI, etc with [facility] that per his report was ok (dr. Bo). Echo (4/11): EF 55-60%, no regional WMAs, normal diastolic function, mild LAE, no source of embolus   Past Surgical History:  Procedure Laterality Date   BMI 30.2 muscular  12/08/04   BRONCHIAL BRUSHINGS  04/15/2021   Procedure: BRONCHIAL BRUSHINGS;  Surgeon: Kassie Acquanetta Bradley, MD;  Location: [facility] ENDOSCOPY;  Service: Pulmonary;;   BRONCHIAL NEEDLE ASPIRATION BIOPSY  04/15/2021  Procedure: BRONCHIAL NEEDLE ASPIRATION BIOPSIES;  Surgeon: Kassie Acquanetta Bradley, MD;  Location: [facility] ENDOSCOPY;  Service: Pulmonary;;   curretage actinic keratosis R ear  01/20/05   ENDOBRONCHIAL ULTRASOUND N/A 04/15/2021   Procedure: ENDOBRONCHIAL ULTRASOUND;  Surgeon: Kassie Acquanetta Bradley, MD;  Location: [facility] ENDOSCOPY;  Service: Pulmonary;  Laterality: N/A;   ETT low prob of CAD  11/21/03   excision melanoma in situ back  01/20/05   hemorrhoids sclerosed at  colonoscopy  12/19/05   INGUINAL HERNIA REPAIR Right 11/20/2021   Procedure: LAPAROSCOPIC RIGHT  INGUINAL HERNIA REPAIR WITH MESH;  Surgeon: Stechschulte, Deward PARAS, MD;  Location: [facility];  Service: General;  Laterality: Right;   IR IMAGING GUIDED PORT INSERTION  05/19/2020   IR REMOVAL TUN ACCESS W/ PORT W/O FL MOD SED  02/02/2022   LAPAROSCOPIC INGUINAL HERNIA REPAIR Left 12/20/1995   LUMBAR DISC SURGERY     NERVE REPAIR     back of neck   retinal laser surgery Left 09/20/1986   SEPTOPLASTY  11/19/98   TOTAL KNEE ARTHROPLASTY Left 12/13/2016   Procedure: TOTAL KNEE ARTHROPLASTY WITH RIGHT KNEE CORTISONE INJECTION;  Surgeon: Dempsey Sensor, MD;  Location: [facility] OR;  Service: Orthopedics;  Laterality: Left;   uric acid 6.9  12/22/04   x-ray l great toe  07/21/00    Allergies  No Active Allergies  Home Medications    Prior to Admission medications   Medication Sig Start Date End Date Taking? Authorizing Provider  acetaminophen  (TYLENOL ) 325 MG tablet Take 2 tablets (650 mg total) by mouth every 6 (six) hours as needed for mild pain, moderate pain or fever. 06/20/20   Curcio, Kristin R, NP  albuterol  (VENTOLIN  HFA) 108 (90 Base) MCG/ACT inhaler Inhale 1-2 puffs into the lungs every 6 (six) hours as needed for wheezing or shortness of breath.    [provider]  amLODipine -olmesartan  (AZOR ) 5-20 MG tablet Take 1 tablet by mouth daily. 11/23/22   Hobart Powell BRAVO, MD  aspirin  EC 81 MG tablet Take 81 mg by mouth in the morning. Swallow whole.    [provider]  fluticasone  (FLONASE ) 50 MCG/ACT nasal spray Place 1-2 sprays into both nostrils daily as needed for allergies or rhinitis. 08/22/20   Curcio, Kristin R, NP  hydrocortisone  (ANUSOL -HC) 2.5 % rectal cream Place 1 Application rectally at bedtime. 03/11/23   Zehr, Jessica D, PA-C  hydrocortisone  cream 0.5 % Apply 1 Application topically 2 (two) times daily. Patient not taking: Reported on 09/19/2023 08/10/22   Dorsey,  John T IV, MD  indomethacin (INDOCIN) 50 MG capsule Take 50 mg by mouth 3 (three) times daily as needed (GOUT). 04/03/21   [provider]  loratadine (CLARITIN) 10 MG tablet Take by mouth daily as needed for allergies.    [provider]  Multiple Vitamin (MULTIVITAMIN WITH MINERALS) TABS tablet Take 1 tablet by mouth in the morning.    [provider]  Omega-3 Fatty Acids (FISH OIL) 600 MG CAPS Take 600 mg by mouth in the morning.    [provider]  polyvinyl alcohol  (LIQUIFILM TEARS) 1.4 % ophthalmic solution Place 1 drop into both eyes in the morning.    [provider]  potassium chloride  SA (KLOR-CON  M) 20 MEQ tablet Take 1 tablet (20 mEq total) by mouth daily. Patient not taking: Reported on 09/19/2023 01/06/23   Federico Norleen ONEIDA MADISON, MD  rosuvastatin  (CRESTOR ) 5 MG tablet Take 5 mg by mouth in the morning.  [provider]    Physical Exam    Vital Signs:  JANUEL DOOLAN does not have vital signs available for review today. 122/67   Given telephonic nature of communication, physical exam is limited. AAOx3. NAD. Normal affect.  Speech and respirations are unlabored.  Accessory Clinical Findings    None  Assessment & Plan    1.  Preoperative Cardiovascular Risk Assessment:  According to the Revised Cardiac Risk Index (RCRI), his Perioperative Risk of Major Cardiac Event is (%): 0.9  His Functional Capacity in METs is: 9.89 according to the [employer] Activity Status Index (DASI).   The patient was advised that if he develops new symptoms prior to surgery to contact our office to arrange for a follow-up visit, and he verbalized understanding.  Per office protocol, if patient is without any new symptoms or concerns at the time of their virtual visit, he/she may hold ASA for 7 days prior to procedure. Please resume ASA as soon as possible postprocedure, at the discretion of the surgeon.    Therefore, based on ACC/AHA guidelines,  patient would be at acceptable risk for the planned procedure without further cardiovascular testing. I will route this recommendation to the requesting party via Epic fax function.   A copy of this note will be routed to requesting surgeon.   Time:   Today, I have spent 10 minutes with the patient with telehealth technology discussing medical history, symptoms, and management plan.     Lamarr Satterfield, NP  10/04/2023, 10:22 AM

## 2023-10-13 DIAGNOSIS — M1711 Unilateral primary osteoarthritis, right knee: Secondary | ICD-10-CM | POA: Diagnosis not present

## 2023-10-13 DIAGNOSIS — Z9889 Other specified postprocedural states: Secondary | ICD-10-CM | POA: Diagnosis not present

## 2023-10-13 DIAGNOSIS — M25561 Pain in right knee: Secondary | ICD-10-CM | POA: Diagnosis not present

## 2023-10-28 DIAGNOSIS — G8918 Other acute postprocedural pain: Secondary | ICD-10-CM | POA: Diagnosis not present

## 2023-10-28 DIAGNOSIS — M1711 Unilateral primary osteoarthritis, right knee: Secondary | ICD-10-CM | POA: Diagnosis not present

## 2023-10-28 DIAGNOSIS — Z96651 Presence of right artificial knee joint: Secondary | ICD-10-CM | POA: Diagnosis not present

## 2023-10-28 DIAGNOSIS — M25761 Osteophyte, right knee: Secondary | ICD-10-CM | POA: Diagnosis not present

## 2023-10-31 DIAGNOSIS — Z96651 Presence of right artificial knee joint: Secondary | ICD-10-CM | POA: Diagnosis not present

## 2023-10-31 DIAGNOSIS — R531 Weakness: Secondary | ICD-10-CM | POA: Diagnosis not present

## 2023-10-31 DIAGNOSIS — M25661 Stiffness of right knee, not elsewhere classified: Secondary | ICD-10-CM | POA: Diagnosis not present

## 2023-11-01 DIAGNOSIS — Z96651 Presence of right artificial knee joint: Secondary | ICD-10-CM | POA: Diagnosis not present

## 2023-11-01 DIAGNOSIS — M25661 Stiffness of right knee, not elsewhere classified: Secondary | ICD-10-CM | POA: Diagnosis not present

## 2023-11-01 DIAGNOSIS — R531 Weakness: Secondary | ICD-10-CM | POA: Diagnosis not present

## 2023-11-08 DIAGNOSIS — M25661 Stiffness of right knee, not elsewhere classified: Secondary | ICD-10-CM | POA: Diagnosis not present

## 2023-11-14 DIAGNOSIS — M25661 Stiffness of right knee, not elsewhere classified: Secondary | ICD-10-CM | POA: Diagnosis not present

## 2023-11-14 DIAGNOSIS — Z96651 Presence of right artificial knee joint: Secondary | ICD-10-CM | POA: Diagnosis not present

## 2023-11-14 DIAGNOSIS — R531 Weakness: Secondary | ICD-10-CM | POA: Diagnosis not present

## 2023-11-16 DIAGNOSIS — M25661 Stiffness of right knee, not elsewhere classified: Secondary | ICD-10-CM | POA: Diagnosis not present

## 2023-11-16 DIAGNOSIS — Z96651 Presence of right artificial knee joint: Secondary | ICD-10-CM | POA: Diagnosis not present

## 2023-11-16 DIAGNOSIS — R531 Weakness: Secondary | ICD-10-CM | POA: Diagnosis not present

## 2023-11-18 ENCOUNTER — Ambulatory Visit: Payer: Medicare PPO | Admitting: Cardiology

## 2023-11-21 DIAGNOSIS — Z96651 Presence of right artificial knee joint: Secondary | ICD-10-CM | POA: Diagnosis not present

## 2023-11-21 DIAGNOSIS — R531 Weakness: Secondary | ICD-10-CM | POA: Diagnosis not present

## 2023-11-21 DIAGNOSIS — M25661 Stiffness of right knee, not elsewhere classified: Secondary | ICD-10-CM | POA: Diagnosis not present

## 2023-11-23 DIAGNOSIS — Z96651 Presence of right artificial knee joint: Secondary | ICD-10-CM | POA: Diagnosis not present

## 2023-11-23 DIAGNOSIS — R531 Weakness: Secondary | ICD-10-CM | POA: Diagnosis not present

## 2023-11-23 DIAGNOSIS — M25661 Stiffness of right knee, not elsewhere classified: Secondary | ICD-10-CM | POA: Diagnosis not present

## 2023-11-25 ENCOUNTER — Other Ambulatory Visit: Payer: Self-pay

## 2023-11-25 DIAGNOSIS — Z79899 Other long term (current) drug therapy: Secondary | ICD-10-CM

## 2023-11-25 DIAGNOSIS — I10 Essential (primary) hypertension: Secondary | ICD-10-CM

## 2023-11-25 MED ORDER — AMLODIPINE-OLMESARTAN 5-20 MG PO TABS: 1.0000 | ORAL_TABLET | Freq: Every day | ORAL | 0 refills | Status: DC

## 2023-11-28 DIAGNOSIS — M25661 Stiffness of right knee, not elsewhere classified: Secondary | ICD-10-CM | POA: Diagnosis not present

## 2023-11-28 DIAGNOSIS — Z96651 Presence of right artificial knee joint: Secondary | ICD-10-CM | POA: Diagnosis not present

## 2023-11-28 DIAGNOSIS — R531 Weakness: Secondary | ICD-10-CM | POA: Diagnosis not present

## 2023-12-01 DIAGNOSIS — R531 Weakness: Secondary | ICD-10-CM | POA: Diagnosis not present

## 2023-12-01 DIAGNOSIS — M25661 Stiffness of right knee, not elsewhere classified: Secondary | ICD-10-CM | POA: Diagnosis not present

## 2023-12-01 DIAGNOSIS — Z96651 Presence of right artificial knee joint: Secondary | ICD-10-CM | POA: Diagnosis not present

## 2023-12-07 DIAGNOSIS — M25661 Stiffness of right knee, not elsewhere classified: Secondary | ICD-10-CM | POA: Diagnosis not present

## 2023-12-07 DIAGNOSIS — Z96651 Presence of right artificial knee joint: Secondary | ICD-10-CM | POA: Diagnosis not present

## 2023-12-07 DIAGNOSIS — R531 Weakness: Secondary | ICD-10-CM | POA: Diagnosis not present

## 2023-12-08 DIAGNOSIS — Z96651 Presence of right artificial knee joint: Secondary | ICD-10-CM | POA: Diagnosis not present

## 2023-12-08 DIAGNOSIS — M25661 Stiffness of right knee, not elsewhere classified: Secondary | ICD-10-CM | POA: Diagnosis not present

## 2023-12-08 DIAGNOSIS — R531 Weakness: Secondary | ICD-10-CM | POA: Diagnosis not present

## 2023-12-12 ENCOUNTER — Encounter: Payer: Self-pay | Admitting: Cardiology

## 2023-12-12 ENCOUNTER — Ambulatory Visit: Payer: Medicare PPO | Attending: Cardiology | Admitting: Cardiology

## 2023-12-12 VITALS — BP 136/78 | HR 63 | Resp 16 | Ht 68.0 in | Wt 195.0 lb

## 2023-12-12 DIAGNOSIS — R002 Palpitations: Secondary | ICD-10-CM | POA: Diagnosis not present

## 2023-12-12 DIAGNOSIS — E782 Mixed hyperlipidemia: Secondary | ICD-10-CM | POA: Diagnosis not present

## 2023-12-12 DIAGNOSIS — I10 Essential (primary) hypertension: Secondary | ICD-10-CM | POA: Diagnosis not present

## 2023-12-12 NOTE — Progress Notes (Signed)
 Cardiology Office Note:  .   Date:  12/12/2023  ID:  Connor Adams, DOB Nov 20, 1945, MRN [id number] PCP:  Noberto Retort, MD  Former Cardiology Providers: Dr. Lowella [facility] Providers Cardiologist:  Tessa Lerner, DO , [facility] (established care 12/12/23) Electrophysiologist:  None  Click to update primary MD,subspecialty MD or APP then REFRESH:1}    Chief Complaint  Patient presents with   Follow-up    1 year follow up - palpitations    History of Present Illness: .   Connor Adams is a 78 y.o. Caucasian 1 year follow-up male whose past medical history and cardiovascular risk factors includes: Non-Hodgkin's lymphoma status postchemotherapy, GERD, hypertension with chronic kidney disease, hyperlipidemia, history of TIA.  Formally under the care of Dr. Shari Prows who last saw Connor Adams back in March 2024. I am seeing him for the first time to re-establishing care.   As per the last progress note from Dr. Shari Prows patient was being followed for palpitations.  The symptoms have resolved after stopping Sudafed and overall was feeling well.  He is six weeks post right knee replacement surgery and did not have any cardiac complications. Recovery is ongoing with some residual pain, but he is gradually increasing physical activity. Prior to surgery, he was very active, engaging in various physical activities including walking three miles daily, using a treadmill, weights, a rowing machine, and other exercise equipment. Currently, he has resumed walking up to a mile.   Review of Systems: .   Review of Systems  Cardiovascular:  Negative for chest pain, claudication, irregular heartbeat, leg swelling, near-syncope, orthopnea, palpitations, paroxysmal nocturnal dyspnea and syncope.  Respiratory:  Negative for shortness of breath.   Hematologic/Lymphatic: Negative for bleeding problem.  Musculoskeletal:  Positive for joint pain (right knee pain , postop).    Studies  Reviewed:   EKG: EKG Interpretation Date/Time:  Monday December 12 2023 13:31:38 EDT Ventricular Rate:  65 PR Interval:  174 QRS Duration:  90 QT Interval:  400 QTC Calculation: 416 R Axis:   68  Text Interpretation: Normal sinus rhythm with sinus arrhythmia Normal ECG When compared with ECG of 20-Nov-2021 09:06, No significant change was found Confirmed by Tessa Lerner [phone]) on 12/12/2023 1:47:14 PM  Echocardiogram: 02/2021 LVEF 55 to 60%, grade 1 diastolic dysfunction, global longitudinal strain -18%, see report for additional details  RADIOLOGY: NA  Risk Assessment/Calculations:   NA   Labs:       Latest Ref Rng & Units 07/08/2023    2:04 PM 01/06/2023    2:03 PM 08/02/2022   10:09 AM  CBC  WBC 4.0 - 10.5 K/uL 6.0  6.5  5.6   Hemoglobin 13.0 - 17.0 g/dL 56.4  33.2  95.1   Hematocrit 39.0 - 52.0 % 38.7  40.9  45.9   Platelets 150 - 400 K/uL 180  152  159        Latest Ref Rng & Units 08/27/2023    1:16 PM 07/08/2023    2:04 PM 01/13/2023   10:26 AM  BMP  Glucose 70 - 99 mg/dL  884  166   BUN 8 - 23 mg/dL  29  26   Creatinine 0.63 - 1.24 mg/dL 0.16  0.10  9.32   BUN/Creat Ratio 10 - 24   19   Sodium 135 - 145 mmol/L  139  144   Potassium 3.5 - 5.1 mmol/L  4.3  4.9   Chloride 98 - 111 mmol/L  105  104   CO2 22 - 32 mmol/L  27  22   Calcium 8.9 - 10.3 mg/dL  9.5  9.4       Latest Ref Rng & Units 08/27/2023    1:16 PM 07/08/2023    2:04 PM 01/13/2023   10:26 AM  CMP  Glucose 70 - 99 mg/dL  244  010   BUN 8 - 23 mg/dL  29  26   Creatinine 2.72 - 1.24 mg/dL 5.36  6.44  0.34   Sodium 135 - 145 mmol/L  139  144   Potassium 3.5 - 5.1 mmol/L  4.3  4.9   Chloride 98 - 111 mmol/L  105  104   CO2 22 - 32 mmol/L  27  22   Calcium 8.9 - 10.3 mg/dL  9.5  9.4   Total Protein 6.5 - 8.1 g/dL  7.1    Total Bilirubin 0.3 - 1.2 mg/dL  0.5    Alkaline Phos 38 - 126 U/L  46    AST 15 - 41 U/L  24    ALT 0 - 44 U/L  22     External Labs: Collected: 10/13/2023 [facility]  database. A1c 6.0. BUN 25, creatinine 1.46. Hemoglobin 14.3. Potassium 4.7  Physical Exam:    Today's Vitals   12/12/23 1327  BP: 136/78  Pulse: 63  Resp: 16  SpO2: 97%  Weight: 195 lb (88.5 kg)  Height: 5\' 8"  (1.727 m)   Body mass index is 29.65 kg/m. Wt Readings from Last 3 Encounters:  12/12/23 195 lb (88.5 kg)  07/08/23 193 lb 11.2 oz (87.9 kg)  03/11/23 193 lb 2 oz (87.6 kg)    Physical Exam  Constitutional: No distress.  hemodynamically stable  Neck: No JVD present.  Cardiovascular: Normal rate, regular rhythm, S1 normal and S2 normal. Exam reveals no gallop, no S3 and no S4.  No murmur heard. Pulmonary/Chest: Effort normal and breath sounds normal. No stridor. He has no wheezes. He has no rales.  Musculoskeletal:        General: No edema.     Cervical back: Neck supple.  Skin: Skin is warm.   Impression & Recommendation(s):  Impression:   ICD-10-CM   1. Palpitations  R00.2     2. Essential hypertension, benign  I10 EKG 12-Lead    3. Mixed hyperlipidemia  E78.2        Recommendation(s):  Palpitations Resolved. No reoccurrence since last office visit. No additional workup warranted at this time. Monitor  Essential hypertension, benign Office blood pressures are very well-controlled. Ambulatory blood pressure log reviewed-Home blood pressures are also well-controlled. Reemphasized importance of low-salt diet. Will scan his blood pressure log into the media section  Mixed hyperlipidemia Currently on Crestor 5 mg p.o. every morning. Lipids currently being managed by PCP.   Orders Placed:  Orders Placed This Encounter  Procedures   EKG 12-Lead     Final Medication List:   No orders of the defined types were placed in this encounter.   Medications Discontinued During This Encounter  Medication Reason   potassium chloride SA (KLOR-CON M) 20 MEQ tablet Patient Preference     Current Outpatient Medications:    acetaminophen (TYLENOL) 325  MG tablet, Take 2 tablets (650 mg total) by mouth every 6 (six) hours as needed for mild pain, moderate pain or fever., Disp: , Rfl:    albuterol (VENTOLIN HFA) 108 (90 Base) MCG/ACT inhaler, Inhale 1-2 puffs into the lungs every 6 (six) hours  as needed for wheezing or shortness of breath., Disp: , Rfl:    amLODipine-olmesartan (AZOR) 5-20 MG tablet, Take 1 tablet by mouth daily., Disp: 90 tablet, Rfl: 0   aspirin EC 81 MG tablet, Take 81 mg by mouth in the morning. Swallow whole., Disp: , Rfl:    fluticasone (FLONASE) 50 MCG/ACT nasal spray, Place 1-2 sprays into both nostrils daily as needed for allergies or rhinitis., Disp: , Rfl: 2   hydrocortisone (ANUSOL-HC) 2.5 % rectal cream, Place 1 Application rectally at bedtime., Disp: 30 g, Rfl: 1   hydrocortisone cream 0.5 %, Apply 1 Application topically 2 (two) times daily., Disp: 30 g, Rfl: 0   indomethacin (INDOCIN) 50 MG capsule, Take 50 mg by mouth 3 (three) times daily as needed (GOUT)., Disp: , Rfl:    loratadine (CLARITIN) 10 MG tablet, Take by mouth daily as needed for allergies., Disp: , Rfl:    Multiple Vitamin (MULTIVITAMIN WITH MINERALS) TABS tablet, Take 1 tablet by mouth in the morning., Disp: , Rfl:    Omega-3 Fatty Acids (FISH OIL) 600 MG CAPS, Take 600 mg by mouth in the morning., Disp: , Rfl:    polyvinyl alcohol (LIQUIFILM TEARS) 1.4 % ophthalmic solution, Place 1 drop into both eyes in the morning., Disp: , Rfl:    rosuvastatin (CRESTOR) 5 MG tablet, Take 5 mg by mouth in the morning., Disp: , Rfl:   Consent:   NA  Disposition:   3-year follow-up, sooner if needed.  His questions and concerns were addressed to his satisfaction. He voices understanding of the recommendations provided during this encounter.    Signed, Tessa Lerner, DO, [facility]  [facility]  [address] [place], [place] [address] 12/12/2023 6:35 PM

## 2023-12-12 NOTE — Patient Instructions (Signed)
 Medication Instructions:  Your physician recommends that you continue on your current medications as directed. Please refer to the Current Medication list given to you today.  *If you need a refill on your cardiac medications before your next appointment, please call your pharmacy*  Lab Work: None ordered today. If you have labs (blood work) drawn today and your tests are completely normal, you will receive your results only by: MyChart Message (if you have MyChart) OR A paper copy in the mail If you have any lab test that is abnormal or we need to change your treatment, we will call you to review the results.  Testing/Procedures: None ordered today.  Follow-Up: At [facility], you and your health needs are our priority.  As part of our continuing mission to provide you with exceptional heart care, we have created designated Provider Care Teams.  These Care Teams include your primary Cardiologist (physician) and Advanced Practice Providers (APPs -  Physician Assistants and Nurse Practitioners) who all work together to provide you with the care you need, when you need it.  We recommend signing up for the patient portal called "MyChart".  Sign up information is provided on this After Visit Summary.  MyChart is used to connect with patients for Virtual Visits (Telemedicine).  Patients are able to view lab/test results, encounter notes, upcoming appointments, etc.  Non-urgent messages can be sent to your provider as well.   To learn more about what you can do with MyChart, go to [email or web address].    Your next appointment:   3 year(s)  The format for your next appointment:   In Person  Provider:   Tessa Lerner, DO {

## 2023-12-15 DIAGNOSIS — L570 Actinic keratosis: Secondary | ICD-10-CM | POA: Diagnosis not present

## 2023-12-15 DIAGNOSIS — C44319 Basal cell carcinoma of skin of other parts of face: Secondary | ICD-10-CM | POA: Diagnosis not present

## 2023-12-15 DIAGNOSIS — X32XXXD Exposure to sunlight, subsequent encounter: Secondary | ICD-10-CM | POA: Diagnosis not present

## 2024-01-13 ENCOUNTER — Inpatient Hospital Stay: Payer: Medicare PPO | Admitting: Hematology and Oncology

## 2024-01-13 ENCOUNTER — Inpatient Hospital Stay: Payer: Medicare PPO | Attending: Hematology and Oncology

## 2024-01-13 ENCOUNTER — Other Ambulatory Visit: Payer: Self-pay | Admitting: Hematology and Oncology

## 2024-01-13 VITALS — BP 149/73 | HR 59 | Temp 97.5°F | Resp 14 | Wt 194.4 lb

## 2024-01-13 DIAGNOSIS — C8333 Diffuse large B-cell lymphoma, intra-abdominal lymph nodes: Secondary | ICD-10-CM

## 2024-01-13 DIAGNOSIS — R918 Other nonspecific abnormal finding of lung field: Secondary | ICD-10-CM | POA: Diagnosis not present

## 2024-01-13 DIAGNOSIS — C8330 Diffuse large B-cell lymphoma, unspecified site: Secondary | ICD-10-CM | POA: Insufficient documentation

## 2024-01-13 DIAGNOSIS — Z79899 Other long term (current) drug therapy: Secondary | ICD-10-CM | POA: Insufficient documentation

## 2024-01-13 DIAGNOSIS — Z95828 Presence of other vascular implants and grafts: Secondary | ICD-10-CM | POA: Diagnosis not present

## 2024-01-13 LAB — CBC WITH DIFFERENTIAL (CANCER CENTER ONLY)
Abs Immature Granulocytes: 0.03 10*3/uL (ref 0.00–0.07)
Basophils Absolute: 0.1 10*3/uL (ref 0.0–0.1)
Basophils Relative: 1 %
Eosinophils Absolute: 0.2 10*3/uL (ref 0.0–0.5)
Eosinophils Relative: 3 %
HCT: 37.1 % — ABNORMAL LOW (ref 39.0–52.0)
Hemoglobin: 12.5 g/dL — ABNORMAL LOW (ref 13.0–17.0)
Immature Granulocytes: 1 %
Lymphocytes Relative: 25 %
Lymphs Abs: 1.7 10*3/uL (ref 0.7–4.0)
MCH: 32 pg (ref 26.0–34.0)
MCHC: 33.7 g/dL (ref 30.0–36.0)
MCV: 94.9 fL (ref 80.0–100.0)
Monocytes Absolute: 0.3 10*3/uL (ref 0.1–1.0)
Monocytes Relative: 5 %
Neutro Abs: 4.4 10*3/uL (ref 1.7–7.7)
Neutrophils Relative %: 65 %
Platelet Count: 189 10*3/uL (ref 150–400)
RBC: 3.91 MIL/uL — ABNORMAL LOW (ref 4.22–5.81)
RDW: 13.8 % (ref 11.5–15.5)
WBC Count: 6.7 10*3/uL (ref 4.0–10.5)
nRBC: 0 % (ref 0.0–0.2)

## 2024-01-13 LAB — CMP (CANCER CENTER ONLY)
ALT: 18 U/L (ref 0–44)
AST: 22 U/L (ref 15–41)
Albumin: 4.5 g/dL (ref 3.5–5.0)
Alkaline Phosphatase: 49 U/L (ref 38–126)
Anion gap: 6 (ref 5–15)
BUN: 35 mg/dL — ABNORMAL HIGH (ref 8–23)
CO2: 27 mmol/L (ref 22–32)
Calcium: 9.4 mg/dL (ref 8.9–10.3)
Chloride: 108 mmol/L (ref 98–111)
Creatinine: 1.64 mg/dL — ABNORMAL HIGH (ref 0.61–1.24)
GFR, Estimated: 43 mL/min — ABNORMAL LOW (ref >60)
Glucose, Bld: 140 mg/dL — ABNORMAL HIGH (ref 70–99)
Potassium: 4.3 mmol/L (ref 3.5–5.1)
Sodium: 141 mmol/L (ref 135–145)
Total Bilirubin: 0.4 mg/dL (ref 0.0–1.2)
Total Protein: 7.1 g/dL (ref 6.5–8.1)

## 2024-01-13 LAB — LACTATE DEHYDROGENASE: LDH: 144 U/L (ref 98–192)

## 2024-01-13 NOTE — Progress Notes (Signed)
 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   1) 04/17/2020:  CT A/P showed dominant nodal mass (11.5 x 10.9 cm) within the jejunal mesentery, highly suspicious for lymphoma. Additionally there are left lower lobe pleural-based pulmonary nodules 2) 04/24/2020: establish care with Dr. Rosaline Coma 3) 04/28/2020:  PET CT scan shows Large solid left mesenteric mass 13.0 cm with maximum SUV of 21.2, Deauville 5 with local hypermetabolic porta hepatis and retroperitoneal lymph nodes. Constitutes bulky Stage I disease.  4) 05/27/2020-05/31/2020: Cycle 1 of R-EPOCH 5) 06/16/2020-06/23/2020: Cycle 2 of R-EPOCH 6) 07/07/2020-07/11/2020: Cycle 3 of R-EPOCH  7) 07/28/2020-08/01/2020: Cycle 4 of R-EPOCH 8) 08/18/2020-08/22/2020: Cycle 5 of R-EPOCH 9) 09/08/2020-09/12/2020: Cycle 6 of R-EPOCH 10) 10/06/2020: PET CT scan shows decreased size of dominant mesenteric mass, however there was hypermetabolic activity within a single left anterior mesenteric lymph node  11) 12/31/2020: PET CT scan showed hypermetabolic nodular thickening the medial aspect of the RIGHT lower lobe is concerning for neoplasm versus atypical inflammatory response 12) 03/27/2021: PET CT Scan showed progression of disease compared with 12/30/2020. Multiple mediastinal and hilar lymph nodes exhibit new FDG uptake compatible with Deauville criteria 5. 13) 04/15/2021: bronchoscopy performed with biopsy of mediastinal lymph nodes.  Findings consistent with granulomatous disease.  No evidence of recurrence. 14) 07/22/2021: PET CT scan showed mediastinal, hilar, abdominal and inguinal lymph nodes, compatible with the provided history of lymphoma (overall Deauville 4). 15) 10/21/2021: CT scan  shows no clear evidence of recurrence of lymphoma. Stable previously note lymph nodes and lung nodules.   Interval History:  VERDIE Adams 78 y.o. male with medical history significant for Stage I DLBCL who presents for a follow up visit. The patient was last seen on 07/08/2023 .  In the interim since his last visit he has had no major changes in his health.   On exam today Mr. Connor Adams is unaccompanied.  He reports he has been well overall in the interim since her last visit.  He reports he did recently undergo a right knee replacement and is tolerating it quite well.  He was sore for about 2 to 3 weeks but reports he is making a good recovery.  He reports now his energy is about an 8 or 9 out of 10.  He feels like he is doing "great".  His appetite is very strong.  He notes that he is only 4 to 5 weeks out from the surgery and his mobility is near back to baseline.  Overall he feels well and has no questions concerns or complaints today.  He denies any lymphadenopathy, fevers, chills, sweats.  A full 10 point ROS is otherwise negative.[name]   MEDICAL HISTORY:  Past Medical History:  Diagnosis Date   Arthritis    Chronic kidney disease (CKD), stage III (moderate) (HCC)    COVID-19    GERD (gastroesophageal reflux disease)    occ   Hemorrhoids    History of ETT 3/05   low risk   History of hiatal hernia    History of kidney stones    HLD (hyperlipidemia)    HTN (hypertension)    Left inguinal hernia    Melanoma (HCC)    excied   Sciatica    from lumbar disc disease  Stroke [facility]) 2011   tia    TIA (transient ischemic attack) 4/11   associated w slurred speecha ndmild facial droop. lasted for about 10 minutes. Had MRI, etc with [facility] that per his report was ok (dr. Josefa Ni). Echo (4/11): EF 55-60%, no regional WMAs, normal diastolic function, mild LAE, no source of embolus    SURGICAL HISTORY: Past Surgical History:  Procedure Laterality Date   BMI 30.2 muscular   12/08/04   BRONCHIAL BRUSHINGS  04/15/2021   Procedure: BRONCHIAL BRUSHINGS;  Surgeon: Quillian Brunt, MD;  Location: [facility] ENDOSCOPY;  Service: Pulmonary;;   BRONCHIAL NEEDLE ASPIRATION BIOPSY  04/15/2021   Procedure: BRONCHIAL NEEDLE ASPIRATION BIOPSIES;  Surgeon: Quillian Brunt, MD;  Location: [facility] ENDOSCOPY;  Service: Pulmonary;;   curretage actinic keratosis R ear  01/20/05   ENDOBRONCHIAL ULTRASOUND N/A 04/15/2021   Procedure: ENDOBRONCHIAL ULTRASOUND;  Surgeon: Quillian Brunt, MD;  Location: [facility] ENDOSCOPY;  Service: Pulmonary;  Laterality: N/A;   ETT low prob of CAD  11/21/03   excision melanoma in situ back  01/20/05   hemorrhoids sclerosed at colonoscopy  12/19/05   INGUINAL HERNIA REPAIR Right 11/20/2021   Procedure: LAPAROSCOPIC RIGHT  INGUINAL HERNIA REPAIR WITH MESH;  Surgeon: Stechschulte, Avon Boers, MD;  Location: [facility];  Service: General;  Laterality: Right;   IR IMAGING GUIDED PORT INSERTION  05/19/2020   IR REMOVAL TUN ACCESS W/ PORT W/O FL MOD SED  02/02/2022   LAPAROSCOPIC INGUINAL HERNIA REPAIR Left 12/20/1995   LUMBAR DISC SURGERY     NERVE REPAIR     back of neck   retinal laser surgery Left 09/20/1986   SEPTOPLASTY  11/19/98   TOTAL KNEE ARTHROPLASTY Left 12/13/2016   Procedure: TOTAL KNEE ARTHROPLASTY WITH RIGHT KNEE CORTISONE INJECTION;  Surgeon: Wendolyn Hamburger, MD;  Location: [facility] OR;  Service: Orthopedics;  Laterality: Left;   uric acid 6.9  12/22/04   x-ray l great toe  07/21/00    SOCIAL HISTORY: Social History   Socioeconomic History   Marital status: Married    Spouse name: [name]   Number of children: 2   Years of education: 12   Highest education level: Not on file  Occupational History   Occupation: Development worker, community: [employer]  Tobacco Use   Smoking status: Never   Smokeless tobacco: Never  Vaping Use   Vaping status: Never Used  Substance and Sexual Activity   Alcohol  use: No   Drug use: No   Sexual activity: Yes  Other  Topics Concern   Not on file  Social History Narrative   Married to [employer] at [employer]   Son, Jock Muller married   Son, Bambi Lever, Married.    Caffeine use: 1 cup coffee per day   Social Drivers of Corporate investment banker Strain: Not on file  Food Insecurity: Not on file  Transportation Needs: Not on file  Physical Activity: Not on file  Stress: Not on file  Social Connections: Not on file  Intimate Partner Violence: Not on file    FAMILY HISTORY: Family History  Problem Relation Age of Onset   Liver cancer Sister    Heart failure Brother 34       CABGx4   Heart attack Mother 6   Uterine cancer Mother    Heart attack Father 77   Asthma Son    Diabetes Brother    Prostate cancer Brother  ALLERGIES:  has no active allergies.  MEDICATIONS:  Current Outpatient Medications  Medication Sig Dispense Refill   acetaminophen  (TYLENOL ) 325 MG tablet Take 2 tablets (650 mg total) by mouth every 6 (six) hours as needed for mild pain, moderate pain or fever.     albuterol  (VENTOLIN  HFA) 108 (90 Base) MCG/ACT inhaler Inhale 1-2 puffs into the lungs every 6 (six) hours as needed for wheezing or shortness of breath.     amLODipine -olmesartan  (AZOR ) 5-20 MG tablet Take 1 tablet by mouth daily. 90 tablet 0   aspirin  EC 81 MG tablet Take 81 mg by mouth in the morning. Swallow whole.     fluticasone  (FLONASE ) 50 MCG/ACT nasal spray Place 1-2 sprays into both nostrils daily as needed for allergies or rhinitis.  2   hydrocortisone  (ANUSOL -HC) 2.5 % rectal cream Place 1 Application rectally at bedtime. 30 g 1   hydrocortisone  cream 0.5 % Apply 1 Application topically 2 (two) times daily. 30 g 0   indomethacin (INDOCIN) 50 MG capsule Take 50 mg by mouth 3 (three) times daily as needed (GOUT).     loratadine (CLARITIN) 10 MG tablet Take by mouth daily as needed for allergies.     Multiple Vitamin (MULTIVITAMIN WITH MINERALS) TABS tablet Take 1 tablet by mouth in the  morning.     Omega-3 Fatty Acids (FISH OIL) 600 MG CAPS Take 600 mg by mouth in the morning.     polyvinyl alcohol  (LIQUIFILM TEARS) 1.4 % ophthalmic solution Place 1 drop into both eyes in the morning.     rosuvastatin  (CRESTOR ) 5 MG tablet Take 5 mg by mouth in the morning.     No current facility-administered medications for this visit.    REVIEW OF SYSTEMS:   Constitutional: ( - ) fevers, ( - )  chills , ( - ) night sweats Eyes: ( - ) blurriness of vision, ( - ) double vision, ( - ) watery eyes Ears, nose, mouth, throat, and face: ( - ) mucositis, ( - ) sore throat Respiratory: ( - ) cough, ( - ) dyspnea, ( - ) wheezes Cardiovascular: ( - ) palpitation, ( - ) chest discomfort, ( - ) lower extremity swelling Gastrointestinal:  ( - ) nausea, ( - ) heartburn, ( - ) change in bowel habits Skin: ( - ) abnormal skin rashes Lymphatics: ( - ) new lymphadenopathy, ( - ) easy bruising Neurological: ( - ) numbness, ( - ) tingling, ( - ) new weaknesses Behavioral/Psych: ( - ) mood change, ( - ) new changes  All other systems were reviewed with the patient and are negative.  PHYSICAL EXAMINATION: ECOG PERFORMANCE STATUS: 1 - Symptomatic but completely ambulatory  Vitals:   01/13/24 1500  BP: (!) 149/73  Pulse: (!) 59  Resp: 14  Temp: (!) 97.5 F (36.4 C)  SpO2: 99%      Filed Weights   01/13/24 1500  Weight: 194 lb 6.4 oz (88.2 kg)      GENERAL: well appearing elderly Caucasian male. alert, no distress and comfortable SKIN: skin color, texture, turgor are normal, no rashes or significant lesions.  EYES: conjunctiva are pink and non-injected, sclera clear LUNGS: clear to auscultation and percussion with normal breathing effort HEART: regular rate & rhythm and no murmurs and trace lower extremity edema Musculoskeletal: no cyanosis of digits and no clubbing  PSYCH: alert & oriented x 3, fluent speech NEURO: no focal motor/sensory deficits  LABORATORY DATA:  I have reviewed  the  data as listed    Latest Ref Rng & Units 01/13/2024    2:18 PM 07/08/2023    2:04 PM 01/06/2023    2:03 PM  CBC  WBC 4.0 - 10.5 K/uL 6.7  6.0  6.5   Hemoglobin 13.0 - 17.0 g/dL 46.9  62.9  52.8   Hematocrit 39.0 - 52.0 % 37.1  38.7  40.9   Platelets 150 - 400 K/uL 189  180  152        Latest Ref Rng & Units 01/13/2024    2:18 PM 08/27/2023    1:16 PM 07/08/2023    2:04 PM  CMP  Glucose 70 - 99 mg/dL 413   244   BUN 8 - 23 mg/dL 35   29   Creatinine 0.10 - 1.24 mg/dL 2.72  5.36  6.44   Sodium 135 - 145 mmol/L 141   139   Potassium 3.5 - 5.1 mmol/L 4.3   4.3   Chloride 98 - 111 mmol/L 108   105   CO2 22 - 32 mmol/L 27   27   Calcium  8.9 - 10.3 mg/dL 9.4   9.5   Total Protein 6.5 - 8.1 g/dL 7.1   7.1   Total Bilirubin 0.0 - 1.2 mg/dL 0.4   0.5   Alkaline Phos 38 - 126 U/L 49   46   AST 15 - 41 U/L 22   24   ALT 0 - 44 U/L 18   22     RADIOGRAPHIC STUDIES: I have personally reviewed the radiological images as listed and agreed with the findings in the report: CT scan 01/15/2022 showed no evidence of residual/recurrent disease.   No results found.  ASSESSMENT & PLAN Connor Adams 78 y.o. male with medical history significant for Stage I DLBCL who presents for a follow up visit.  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  At this time he has completed all planned chemotherapy treatment (R-EPOCH x 6 cycles).   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  --findings are consistent with a double hit DLBCL Treatment of choice is R-EPOCH chemotherapy in the inpatient setting. --he is currently s/p Cycle 6 administered on 09/08/2020 --patient has a port in place. TTE was  performed and showed strong baseline heart function. --PET CT scan on 04/16/2021 showed concern for progressive disease in the chest. This was also seen on most recent scan from 07/22/2021. Prior biopsy showed granulomatous tissue with no evidence of lymphoma. At this time suspect the FDG avid areas are inflammatory and not lymphomatous.  --biopsy via bronchoscopy shows no clear evidence of recurrent disease.  Findings are most consistent with granulomatous, inflammatory tissue. --case previously discussed with Dr. Dewey Fordyce of [facility]. She recommendations if recurrence is found that we consider initiation of rituximab /polatuzumab. She will schedule an appointment to see if CAR-T therapy may be an option. Plan:  --No signs or symptoms concerning for recurrence today. -- routine CBC, CMP and LDH at each clinic visit. Labs today show WBC 6.7, hemoglobin 12.5, MCV 94.9, platelets 189 --last CT scan on 07/26/2022 showed no evidence of residual/recurrent disease. Plan for yearly scans moving forward. Next due late Nov 2024.  -- Return  to clinic in 6 months   #Lung Nodules, stable -- noted on interim PET CT scan after Cycle 4.  --unclear if these represent areas of infection, metastatic disease, or progression of lymphoma --pulmonology consulted, appreciate their input.  --will monitor respiratory status closely  # Port in Place -port removed 02/02/2022  No orders of the defined types were placed in this encounter.   All questions were answered. The patient knows to call the clinic with any problems, questions or concerns.  A total of more than 25 minutes were spent on this encounter and over half of that time was spent on counseling and coordination of care as outlined above.   Rogerio Clay, MD Department of Hematology/Oncology [facility] at [facility] Phone: [phone] Pager: [phone] Email: [email or web address]  01/15/2024 6:37 PM

## 2024-01-15 ENCOUNTER — Encounter: Payer: Self-pay | Admitting: Hematology and Oncology

## 2024-02-09 DIAGNOSIS — Z85828 Personal history of other malignant neoplasm of skin: Secondary | ICD-10-CM | POA: Diagnosis not present

## 2024-02-09 DIAGNOSIS — X32XXXD Exposure to sunlight, subsequent encounter: Secondary | ICD-10-CM | POA: Diagnosis not present

## 2024-02-09 DIAGNOSIS — Z08 Encounter for follow-up examination after completed treatment for malignant neoplasm: Secondary | ICD-10-CM | POA: Diagnosis not present

## 2024-02-09 DIAGNOSIS — L570 Actinic keratosis: Secondary | ICD-10-CM | POA: Diagnosis not present

## 2024-02-21 ENCOUNTER — Other Ambulatory Visit: Payer: Self-pay | Admitting: Cardiology

## 2024-02-21 DIAGNOSIS — I10 Essential (primary) hypertension: Secondary | ICD-10-CM

## 2024-02-21 DIAGNOSIS — Z79899 Other long term (current) drug therapy: Secondary | ICD-10-CM

## 2024-04-23 DIAGNOSIS — X32XXXD Exposure to sunlight, subsequent encounter: Secondary | ICD-10-CM | POA: Diagnosis not present

## 2024-04-23 DIAGNOSIS — D0439 Carcinoma in situ of skin of other parts of face: Secondary | ICD-10-CM | POA: Diagnosis not present

## 2024-04-23 DIAGNOSIS — L570 Actinic keratosis: Secondary | ICD-10-CM | POA: Diagnosis not present

## 2024-04-25 DIAGNOSIS — M25561 Pain in right knee: Secondary | ICD-10-CM | POA: Diagnosis not present

## 2024-04-25 DIAGNOSIS — M545 Low back pain, unspecified: Secondary | ICD-10-CM | POA: Diagnosis not present

## 2024-05-18 DIAGNOSIS — B353 Tinea pedis: Secondary | ICD-10-CM | POA: Diagnosis not present

## 2024-05-18 DIAGNOSIS — R7303 Prediabetes: Secondary | ICD-10-CM | POA: Diagnosis not present

## 2024-05-18 DIAGNOSIS — I10 Essential (primary) hypertension: Secondary | ICD-10-CM | POA: Diagnosis not present

## 2024-05-18 DIAGNOSIS — Z0000 Encounter for general adult medical examination without abnormal findings: Secondary | ICD-10-CM | POA: Diagnosis not present

## 2024-05-18 DIAGNOSIS — M109 Gout, unspecified: Secondary | ICD-10-CM | POA: Diagnosis not present

## 2024-05-18 DIAGNOSIS — Z1211 Encounter for screening for malignant neoplasm of colon: Secondary | ICD-10-CM | POA: Diagnosis not present

## 2024-05-18 DIAGNOSIS — E7800 Pure hypercholesterolemia, unspecified: Secondary | ICD-10-CM | POA: Diagnosis not present

## 2024-06-14 DIAGNOSIS — Z85828 Personal history of other malignant neoplasm of skin: Secondary | ICD-10-CM | POA: Diagnosis not present

## 2024-06-14 DIAGNOSIS — Z08 Encounter for follow-up examination after completed treatment for malignant neoplasm: Secondary | ICD-10-CM | POA: Diagnosis not present

## 2024-06-14 DIAGNOSIS — X32XXXD Exposure to sunlight, subsequent encounter: Secondary | ICD-10-CM | POA: Diagnosis not present

## 2024-06-14 DIAGNOSIS — L570 Actinic keratosis: Secondary | ICD-10-CM | POA: Diagnosis not present

## 2024-07-13 ENCOUNTER — Inpatient Hospital Stay: Admitting: Hematology and Oncology

## 2024-07-13 ENCOUNTER — Other Ambulatory Visit: Payer: Self-pay | Admitting: Hematology and Oncology

## 2024-07-13 ENCOUNTER — Inpatient Hospital Stay: Attending: Hematology and Oncology

## 2024-07-13 VITALS — BP 140/80 | HR 61 | Temp 98.0°F | Resp 14 | Wt 191.3 lb

## 2024-07-13 DIAGNOSIS — C8333 Diffuse large B-cell lymphoma, intra-abdominal lymph nodes: Secondary | ICD-10-CM

## 2024-07-13 DIAGNOSIS — C8330 Diffuse large B-cell lymphoma, unspecified site: Secondary | ICD-10-CM | POA: Insufficient documentation

## 2024-07-13 DIAGNOSIS — R918 Other nonspecific abnormal finding of lung field: Secondary | ICD-10-CM | POA: Insufficient documentation

## 2024-07-13 DIAGNOSIS — Z79899 Other long term (current) drug therapy: Secondary | ICD-10-CM | POA: Insufficient documentation

## 2024-07-13 LAB — CBC WITH DIFFERENTIAL (CANCER CENTER ONLY)
Abs Immature Granulocytes: 0.01 K/uL (ref 0.00–0.07)
Basophils Absolute: 0.1 K/uL (ref 0.0–0.1)
Basophils Relative: 1 %
Eosinophils Absolute: 0.1 K/uL (ref 0.0–0.5)
Eosinophils Relative: 2 %
HCT: 37.7 % — ABNORMAL LOW (ref 39.0–52.0)
Hemoglobin: 12.8 g/dL — ABNORMAL LOW (ref 13.0–17.0)
Immature Granulocytes: 0 %
Lymphocytes Relative: 24 %
Lymphs Abs: 1.4 K/uL (ref 0.7–4.0)
MCH: 32.6 pg (ref 26.0–34.0)
MCHC: 34 g/dL (ref 30.0–36.0)
MCV: 95.9 fL (ref 80.0–100.0)
Monocytes Absolute: 0.4 K/uL (ref 0.1–1.0)
Monocytes Relative: 7 %
Neutro Abs: 3.9 K/uL (ref 1.7–7.7)
Neutrophils Relative %: 66 %
Platelet Count: 190 K/uL (ref 150–400)
RBC: 3.93 MIL/uL — ABNORMAL LOW (ref 4.22–5.81)
RDW: 12.4 % (ref 11.5–15.5)
WBC Count: 5.9 K/uL (ref 4.0–10.5)
nRBC: 0 % (ref 0.0–0.2)

## 2024-07-13 LAB — CMP (CANCER CENTER ONLY)
ALT: 18 U/L (ref 0–44)
AST: 22 U/L (ref 15–41)
Albumin: 4.4 g/dL (ref 3.5–5.0)
Alkaline Phosphatase: 56 U/L (ref 38–126)
Anion gap: 6 (ref 5–15)
BUN: 32 mg/dL — ABNORMAL HIGH (ref 8–23)
CO2: 29 mmol/L (ref 22–32)
Calcium: 9.6 mg/dL (ref 8.9–10.3)
Chloride: 106 mmol/L (ref 98–111)
Creatinine: 1.56 mg/dL — ABNORMAL HIGH (ref 0.61–1.24)
GFR, Estimated: 45 mL/min — ABNORMAL LOW (ref >60)
Glucose, Bld: 113 mg/dL — ABNORMAL HIGH (ref 70–99)
Potassium: 4.3 mmol/L (ref 3.5–5.1)
Sodium: 141 mmol/L (ref 135–145)
Total Bilirubin: 0.4 mg/dL (ref 0.0–1.2)
Total Protein: 7.2 g/dL (ref 6.5–8.1)

## 2024-07-13 LAB — LACTATE DEHYDROGENASE: LDH: 136 U/L (ref 98–192)

## 2024-07-13 NOTE — Progress Notes (Signed)
 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   1) 04/17/2020:  CT A/P showed dominant nodal mass (11.5 x 10.9 cm) within the jejunal mesentery, highly suspicious for lymphoma. Additionally there are left lower lobe pleural-based pulmonary nodules 2) 04/24/2020: establish care with Dr. Federico 3) 04/28/2020:  PET CT scan shows Large solid left mesenteric mass 13.0 cm with maximum SUV of 21.2, Deauville 5 with local hypermetabolic porta hepatis and retroperitoneal lymph nodes. Constitutes bulky Stage I disease.  4) 05/27/2020-05/31/2020: Cycle 1 of R-EPOCH 5) 06/16/2020-06/23/2020: Cycle 2 of R-EPOCH 6) 07/07/2020-07/11/2020: Cycle 3 of R-EPOCH  7) 07/28/2020-08/01/2020: Cycle 4 of R-EPOCH 8) 08/18/2020-08/22/2020: Cycle 5 of R-EPOCH 9) 09/08/2020-09/12/2020: Cycle 6 of R-EPOCH 10) 10/06/2020: PET CT scan shows decreased size of dominant mesenteric mass, however there was hypermetabolic activity within a single left anterior mesenteric lymph node  11) 12/31/2020: PET CT scan showed hypermetabolic nodular thickening the medial aspect of the RIGHT lower lobe is concerning for neoplasm versus atypical inflammatory response 12) 03/27/2021: PET CT Scan showed progression of disease compared with 12/30/2020. Multiple mediastinal and hilar lymph nodes exhibit new FDG uptake compatible with Deauville criteria 5. 13) 04/15/2021: bronchoscopy performed with biopsy of mediastinal lymph nodes.  Findings consistent with granulomatous disease.  No evidence of recurrence. 14) 07/22/2021: PET CT scan showed mediastinal, hilar, abdominal and inguinal lymph nodes, compatible with the provided history of lymphoma (overall Deauville 4). 15) 10/21/2021: CT scan  shows no clear evidence of recurrence of lymphoma. Stable previously note lymph nodes and lung nodules.   Interval History:  Connor Adams 78 y.o. male with medical history significant for Stage I DLBCL who presents for a follow up visit. The patient was last seen on 01/13/2024 .  In the interim since his last visit he has had no major changes in his health.   On exam today Connor Adams reports that he is enjoying the cooler weather.  He notes he is doing quite well but did have a cough about 3 to 4 weeks ago.  He reports he is producing little bit more mucus in the morning and has a thicker cough at night.  He reports that for the last 3 weeks now his symptoms have been steadily improving.  He is not having a chest pain and shortness of breath and is running 2 miles a day on his treadmill.  He reports that he does not have any difficulty with his endurance and is not having any palpitations.  He reports that his weight has been steady and his appetite has been strong.  His energy is good.  He reports that his wife did finally have her teeth replaced.  He otherwise denies any bumps or lumps or findings concerning for lymphadenopathy.  He feels quite well and has no additional questions concerns or complaints today.  A full 10 point ROS otherwise negative.   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 lasted for about 10 minutes. Had MRI, etc with [facility] that per his report was  ok (dr. Bo). Echo (4/11): EF 55-60%, no regional WMAs, normal diastolic function, mild LAE, no source of embolus    SURGICAL HISTORY: Past Surgical History:  Procedure Laterality Date   BMI 30.2 muscular  12/08/04   BRONCHIAL BRUSHINGS  04/15/2021   Procedure: BRONCHIAL BRUSHINGS;  Surgeon: Kassie Acquanetta Bradley, MD;  Location: [facility] ENDOSCOPY;  Service: Pulmonary;;   BRONCHIAL NEEDLE ASPIRATION BIOPSY  04/15/2021   Procedure: BRONCHIAL NEEDLE ASPIRATION BIOPSIES;  Surgeon: Kassie Acquanetta Bradley, MD;  Location: [facility] ENDOSCOPY;  Service: Pulmonary;;   curretage actinic keratosis R ear  01/20/05   ENDOBRONCHIAL ULTRASOUND N/A 04/15/2021   Procedure: ENDOBRONCHIAL ULTRASOUND;  Surgeon: Kassie Acquanetta Bradley, MD;  Location: [facility] ENDOSCOPY;  Service: Pulmonary;  Laterality: N/A;   ETT low prob of CAD  11/21/03   excision melanoma in situ back  01/20/05   hemorrhoids sclerosed at colonoscopy  12/19/05   INGUINAL HERNIA REPAIR Right 11/20/2021   Procedure: LAPAROSCOPIC RIGHT  INGUINAL HERNIA REPAIR WITH MESH;  Surgeon: Stechschulte, Deward PARAS, MD;  Location: [facility];  Service: General;  Laterality: Right;   IR IMAGING GUIDED PORT INSERTION  05/19/2020   IR REMOVAL TUN ACCESS W/ PORT W/O FL MOD SED  02/02/2022   LAPAROSCOPIC INGUINAL HERNIA REPAIR Left 12/20/1995   LUMBAR DISC SURGERY     NERVE REPAIR     back of neck   retinal laser surgery Left 09/20/1986   SEPTOPLASTY  11/19/98   TOTAL KNEE ARTHROPLASTY Left 12/13/2016   Procedure: TOTAL KNEE ARTHROPLASTY WITH RIGHT KNEE CORTISONE INJECTION;  Surgeon: Dempsey Sensor, MD;  Location: [facility] OR;  Service: Orthopedics;  Laterality: Left;   uric acid 6.9  12/22/04   x-ray l great toe  07/21/00    SOCIAL HISTORY: Social History   Socioeconomic History   Marital status: Married    Spouse name: [name]   Number of children: 2   Years of education: 12   Highest education level: Not on file  Occupational History   Occupation: Development worker, community: [employer]  Tobacco Use   Smoking status: Never   Smokeless tobacco: Never  Vaping Use   Vaping status: Never Used  Substance and Sexual Activity   Alcohol  use: No   Drug use: No   Sexual activity: Yes  Other Topics Concern   Not on file  Social History Narrative   Married to [employer] at [employer]   Son, Prentice married   Son, Ozell, Married.    Caffeine use: 1 cup coffee per day   Social Drivers of Corporate investment banker Strain: Not on file  Food Insecurity: Not on file  Transportation Needs: Not on file  Physical Activity: Not on file  Stress: Not on file  Social Connections: Not on file  Intimate Partner Violence: Not on file    FAMILY HISTORY: Family History  Problem Relation Age of Onset   Liver cancer Sister    Heart failure Brother 40  CABGx4   Heart attack Mother 70   Uterine cancer Mother    Heart attack Father 80   Asthma Son    Diabetes Brother    Prostate cancer Brother     ALLERGIES:  has no active allergies.  MEDICATIONS:  Current Outpatient Medications  Medication Sig Dispense Refill   acetaminophen  (TYLENOL ) 325 MG tablet Take 2 tablets (650 mg total) by mouth every 6 (six) hours as needed for mild pain, moderate pain or fever.     albuterol  (VENTOLIN  HFA) 108 (90 Base) MCG/ACT inhaler Inhale 1-2 puffs into the lungs every 6 (six) hours as needed for wheezing or shortness of breath.     amLODipine -olmesartan  (AZOR ) 5-20 MG tablet TAKE 1 TABLET BY MOUTH DAILY 90 tablet 3   aspirin  EC 81 MG tablet Take 81 mg by mouth in the morning. Swallow whole.     fluticasone  (FLONASE ) 50 MCG/ACT nasal spray Place 1-2 sprays into both nostrils daily as needed for allergies or rhinitis.  2   hydrocortisone  (ANUSOL -HC) 2.5 % rectal cream Place 1 Application rectally at bedtime. 30 g 1   hydrocortisone  cream 0.5 % Apply 1 Application topically 2 (two) times daily. 30 g 0   indomethacin (INDOCIN) 50 MG capsule Take 50 mg by mouth 3  (three) times daily as needed (GOUT).     loratadine (CLARITIN) 10 MG tablet Take by mouth daily as needed for allergies.     Multiple Vitamin (MULTIVITAMIN WITH MINERALS) TABS tablet Take 1 tablet by mouth in the morning.     Omega-3 Fatty Acids (FISH OIL) 600 MG CAPS Take 600 mg by mouth in the morning.     polyvinyl alcohol  (LIQUIFILM TEARS) 1.4 % ophthalmic solution Place 1 drop into both eyes in the morning.     rosuvastatin  (CRESTOR ) 5 MG tablet Take 5 mg by mouth in the morning.     No current facility-administered medications for this visit.    REVIEW OF SYSTEMS:   Constitutional: ( - ) fevers, ( - )  chills , ( - ) night sweats Eyes: ( - ) blurriness of vision, ( - ) double vision, ( - ) watery eyes Ears, nose, mouth, throat, and face: ( - ) mucositis, ( - ) sore throat Respiratory: ( - ) cough, ( - ) dyspnea, ( - ) wheezes Cardiovascular: ( - ) palpitation, ( - ) chest discomfort, ( - ) lower extremity swelling Gastrointestinal:  ( - ) nausea, ( - ) heartburn, ( - ) change in bowel habits Skin: ( - ) abnormal skin rashes Lymphatics: ( - ) new lymphadenopathy, ( - ) easy bruising Neurological: ( - ) numbness, ( - ) tingling, ( - ) new weaknesses Behavioral/Psych: ( - ) mood change, ( - ) new changes  All other systems were reviewed with the patient and are negative.  PHYSICAL EXAMINATION: ECOG PERFORMANCE STATUS: 1 - Symptomatic but completely ambulatory  Vitals:   07/13/24 1452  BP: (!) 140/80  Pulse: 61  Resp: 14  Temp: 98 F (36.7 C)  SpO2: 98%       Filed Weights   07/13/24 1452  Weight: 191 lb 4.8 oz (86.8 kg)       GENERAL: well appearing elderly Caucasian male. alert, no distress and comfortable SKIN: skin color, texture, turgor are normal, no rashes or significant lesions.  EYES: conjunctiva are pink and non-injected, sclera clear LUNGS: clear to auscultation and percussion with normal breathing effort HEART: regular rate & rhythm and  no murmurs and  trace lower extremity edema Musculoskeletal: no cyanosis of digits and no clubbing  PSYCH: alert & oriented x 3, fluent speech NEURO: no focal motor/sensory deficits  LABORATORY DATA:  I have reviewed the data as listed    Latest Ref Rng & Units 07/13/2024    2:39 PM 01/13/2024    2:18 PM 07/08/2023    2:04 PM  CBC  WBC 4.0 - 10.5 K/uL 5.9  6.7  6.0   Hemoglobin 13.0 - 17.0 g/dL 87.1  87.4  87.0   Hematocrit 39.0 - 52.0 % 37.7  37.1  38.7   Platelets 150 - 400 K/uL 190  189  180        Latest Ref Rng & Units 07/13/2024    2:39 PM 01/13/2024    2:18 PM 08/27/2023    1:16 PM  CMP  Glucose 70 - 99 mg/dL 886  859    BUN 8 - 23 mg/dL 32  35    Creatinine 9.38 - 1.24 mg/dL 8.43  8.35  8.49   Sodium 135 - 145 mmol/L 141  141    Potassium 3.5 - 5.1 mmol/L 4.3  4.3    Chloride 98 - 111 mmol/L 106  108    CO2 22 - 32 mmol/L 29  27    Calcium  8.9 - 10.3 mg/dL 9.6  9.4    Total Protein 6.5 - 8.1 g/dL 7.2  7.1    Total Bilirubin 0.0 - 1.2 mg/dL 0.4  0.4    Alkaline Phos 38 - 126 U/L 56  49    AST 15 - 41 U/L 22  22    ALT 0 - 44 U/L 18  18      RADIOGRAPHIC STUDIES: I have personally reviewed the radiological images as listed and agreed with the findings in the report: CT scan 01/15/2022 showed no evidence of residual/recurrent disease.   No results found.  ASSESSMENT & PLAN Connor Adams 78 y.o. male with medical history significant for Stage I DLBCL who presents for a follow up visit.  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  At this time he has completed all planned chemotherapy treatment (R-EPOCH x 6 cycles).   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  --findings are  consistent with a double hit DLBCL Treatment of choice is R-EPOCH chemotherapy in the inpatient setting. --he is currently s/p Cycle 6 administered on 09/08/2020 --patient has a port in place. TTE was performed and showed strong baseline heart function. --PET CT scan on 04/16/2021 showed concern for progressive disease in the chest. This was also seen on most recent scan from 07/22/2021. Prior biopsy showed granulomatous tissue with no evidence of lymphoma. At this time suspect the FDG avid areas are inflammatory and not lymphomatous.  --biopsy via bronchoscopy shows no clear evidence of recurrent disease.  Findings are most consistent with granulomatous, inflammatory tissue. --case previously discussed with Dr. Darren of [facility]. She recommendations if recurrence is found that we consider initiation of rituximab /polatuzumab. She will schedule an appointment to see if CAR-T therapy may be an option. Plan:  --No signs or symptoms concerning for recurrence today. -- routine CBC, CMP and LDH at each  clinic visit. Labs today show WBC 5.9, Hgb 12.8, MCV 95.9, Plt 190  --last CT scan on 08/27/2023 showed no evidence of residual/recurrent disease. Plan for yearly scans moving forward. Next due early Dec 2025.  -- Return to clinic in 6 months   #Lung Nodules, stable -- noted on interim PET CT scan after Cycle 4.  --unclear if these represent areas of infection, metastatic disease, or progression of lymphoma --pulmonology consulted, appreciate their input.  --will monitor respiratory status closely  # Port in Place -port removed 02/02/2022  Orders Placed This Encounter  Procedures   CT CHEST ABDOMEN PELVIS W CONTRAST    Standing Status:   Future    Expected Date:   08/27/2024    Expiration Date:   07/13/2025    If indicated for the ordered procedure, I authorize the administration of contrast media per Radiology protocol:   Yes    Does the patient have a contrast media/X-ray dye allergy?:   No     Preferred imaging location?:   [facility]    If indicated for the ordered procedure, I authorize the administration of oral contrast media per Radiology protocol:   Yes    All questions were answered. The patient knows to call the clinic with any problems, questions or concerns.  A total of more than 25 minutes were spent on this encounter and over half of that time was spent on counseling and coordination of care as outlined above.   Norleen IVAR Kidney, MD Department of Hematology/Oncology [facility] Phone: [phone] Pager: [phone] Email: [email or web address]  07/13/2024 4:26 PM

## 2024-08-27 ENCOUNTER — Ambulatory Visit (HOSPITAL_COMMUNITY)
Admission: RE | Admit: 2024-08-27 | Discharge: 2024-08-27 | Disposition: A | Source: Ambulatory Visit | Attending: Hematology and Oncology

## 2024-08-27 DIAGNOSIS — C8333 Diffuse large B-cell lymphoma, intra-abdominal lymph nodes: Secondary | ICD-10-CM | POA: Diagnosis not present

## 2024-08-27 LAB — POCT I-STAT CREATININE: Creatinine, Ser: 1.5 mg/dL — ABNORMAL HIGH (ref 0.61–1.24)

## 2024-08-27 MED ORDER — IOHEXOL 300 MG/ML  SOLN
100.0000 mL | Freq: Once | INTRAMUSCULAR | Status: AC | PRN
  Administered 2024-08-27: 100 mL via INTRAVENOUS

## 2024-08-27 MED ORDER — IOHEXOL 9 MG/ML PO SOLN
500.0000 mL | ORAL | Status: AC
  Administered 2024-08-27 (×2): 500 mL via ORAL

## 2024-09-06 ENCOUNTER — Ambulatory Visit: Payer: Self-pay | Admitting: *Deleted

## 2024-09-06 NOTE — Telephone Encounter (Signed)
-----   Message from Norleen Kidney, MD sent at 08/29/2024  9:33 AM EST ----- Please let Mr. Biondolillo know his CT scan showed no evidence of residual or recurrent disease.  ----- Message ----- From: Interface, Rad Results In Sent: 08/28/2024   6:00 AM EST To: Norleen ONEIDA Kidney [place], MD

## 2024-09-06 NOTE — Telephone Encounter (Signed)
 TCT patient regarding recent CT scan. Spoke with his wife. Advised that his CT scan showed no evidence of residual or recuurent disease. Advised that his next appt here is in April 2026. She voiced understanding and will share the information with Christopher.   No questions or concerns.

## 2025-01-11 ENCOUNTER — Inpatient Hospital Stay

## 2025-01-11 ENCOUNTER — Inpatient Hospital Stay: Admitting: Hematology and Oncology
